# Patient Record
Sex: Male | Born: 1975
Health system: Southern US, Community
[De-identification: ages and names within clinical notes are randomized; demographics above are authoritative.]

## PROBLEM LIST (undated history)

## (undated) DIAGNOSIS — R480 Dyslexia and alexia: Secondary | ICD-10-CM

## (undated) DIAGNOSIS — R002 Palpitations: Secondary | ICD-10-CM

## (undated) DIAGNOSIS — F419 Anxiety disorder, unspecified: Secondary | ICD-10-CM

## (undated) DIAGNOSIS — F32A Depression, unspecified: Secondary | ICD-10-CM

## (undated) DIAGNOSIS — F329 Major depressive disorder, single episode, unspecified: Secondary | ICD-10-CM

## (undated) DIAGNOSIS — N184 Chronic kidney disease, stage 4 (severe): Secondary | ICD-10-CM

## (undated) DIAGNOSIS — I639 Cerebral infarction, unspecified: Secondary | ICD-10-CM

## (undated) DIAGNOSIS — F1120 Opioid dependence, uncomplicated: Secondary | ICD-10-CM

## (undated) DIAGNOSIS — R0600 Dyspnea, unspecified: Secondary | ICD-10-CM

## (undated) DIAGNOSIS — I10 Essential (primary) hypertension: Secondary | ICD-10-CM

## (undated) DIAGNOSIS — Z720 Tobacco use: Secondary | ICD-10-CM

## (undated) DIAGNOSIS — E7800 Pure hypercholesterolemia, unspecified: Secondary | ICD-10-CM

## (undated) DIAGNOSIS — I160 Hypertensive urgency: Secondary | ICD-10-CM

## (undated) HISTORY — PX: NO PAST SURGERIES: SHX2092

## (undated) HISTORY — DX: Hypertensive urgency: I16.0

## (undated) HISTORY — DX: Opioid dependence, uncomplicated: F11.20

## (undated) HISTORY — DX: Palpitations: R00.2

---

## 1998-01-14 ENCOUNTER — Emergency Department (HOSPITAL_COMMUNITY): Admission: EM | Admit: 1998-01-14 | Discharge: 1998-01-14 | Payer: Self-pay | Admitting: Emergency Medicine

## 1998-01-15 ENCOUNTER — Ambulatory Visit (HOSPITAL_COMMUNITY): Admission: RE | Admit: 1998-01-15 | Discharge: 1998-01-15 | Payer: Self-pay | Admitting: Family Medicine

## 2000-07-17 ENCOUNTER — Emergency Department (HOSPITAL_COMMUNITY): Admission: EM | Admit: 2000-07-17 | Discharge: 2000-07-17 | Payer: Self-pay | Admitting: Emergency Medicine

## 2000-11-14 ENCOUNTER — Emergency Department (HOSPITAL_COMMUNITY): Admission: EM | Admit: 2000-11-14 | Discharge: 2000-11-14 | Payer: Self-pay | Admitting: Emergency Medicine

## 2000-11-14 ENCOUNTER — Encounter: Payer: Self-pay | Admitting: Emergency Medicine

## 2002-01-17 ENCOUNTER — Encounter: Admission: RE | Admit: 2002-01-17 | Discharge: 2002-01-17 | Payer: Self-pay | Admitting: General Practice

## 2002-01-17 ENCOUNTER — Encounter: Payer: Self-pay | Admitting: General Practice

## 2002-12-04 ENCOUNTER — Emergency Department (HOSPITAL_COMMUNITY): Admission: EM | Admit: 2002-12-04 | Discharge: 2002-12-04 | Payer: Self-pay | Admitting: *Deleted

## 2003-02-20 ENCOUNTER — Emergency Department (HOSPITAL_COMMUNITY): Admission: EM | Admit: 2003-02-20 | Discharge: 2003-02-20 | Payer: Self-pay | Admitting: Emergency Medicine

## 2003-02-20 ENCOUNTER — Encounter: Payer: Self-pay | Admitting: Emergency Medicine

## 2003-06-05 ENCOUNTER — Emergency Department (HOSPITAL_COMMUNITY): Admission: EM | Admit: 2003-06-05 | Discharge: 2003-06-05 | Payer: Self-pay | Admitting: *Deleted

## 2004-07-12 ENCOUNTER — Emergency Department (HOSPITAL_COMMUNITY): Admission: EM | Admit: 2004-07-12 | Discharge: 2004-07-12 | Payer: Self-pay | Admitting: Emergency Medicine

## 2004-11-28 ENCOUNTER — Emergency Department (HOSPITAL_COMMUNITY): Admission: EM | Admit: 2004-11-28 | Discharge: 2004-11-28 | Payer: Self-pay | Admitting: Family Medicine

## 2006-05-19 ENCOUNTER — Emergency Department (HOSPITAL_COMMUNITY): Admission: EM | Admit: 2006-05-19 | Discharge: 2006-05-19 | Payer: Self-pay | Admitting: Emergency Medicine

## 2007-08-15 ENCOUNTER — Emergency Department (HOSPITAL_COMMUNITY): Admission: EM | Admit: 2007-08-15 | Discharge: 2007-08-15 | Payer: Self-pay | Admitting: Family Medicine

## 2007-10-22 ENCOUNTER — Emergency Department (HOSPITAL_COMMUNITY): Admission: EM | Admit: 2007-10-22 | Discharge: 2007-10-22 | Payer: Self-pay | Admitting: Family Medicine

## 2009-09-29 ENCOUNTER — Emergency Department (HOSPITAL_COMMUNITY): Admission: EM | Admit: 2009-09-29 | Discharge: 2009-09-29 | Payer: Self-pay | Admitting: Emergency Medicine

## 2009-11-28 ENCOUNTER — Emergency Department (HOSPITAL_COMMUNITY): Admission: EM | Admit: 2009-11-28 | Discharge: 2009-11-28 | Payer: Self-pay | Admitting: Family Medicine

## 2010-03-17 ENCOUNTER — Emergency Department (HOSPITAL_COMMUNITY): Admission: EM | Admit: 2010-03-17 | Discharge: 2010-03-17 | Payer: Self-pay | Admitting: Family Medicine

## 2011-01-03 LAB — BASIC METABOLIC PANEL
BUN: 12 mg/dL (ref 6–23)
CO2: 27 mEq/L (ref 19–32)
Calcium: 9.3 mg/dL (ref 8.4–10.5)
Creatinine, Ser: 1.1 mg/dL (ref 0.4–1.5)
GFR calc Af Amer: >60 mL/min (ref >60)

## 2011-01-03 LAB — CBC
MCHC: 33.3 g/dL (ref 30.0–36.0)
Platelets: 278 10*3/uL (ref 150–400)
RBC: 4.77 MIL/uL (ref 4.22–5.81)
RDW: 14.4 % (ref 11.5–15.5)

## 2011-01-03 LAB — URINALYSIS, ROUTINE W REFLEX MICROSCOPIC
Nitrite: NEGATIVE
Protein, ur: NEGATIVE mg/dL
Specific Gravity, Urine: 1.031 — ABNORMAL HIGH (ref 1.005–1.030)
Urobilinogen, UA: 0.2 mg/dL (ref 0.0–1.0)

## 2011-01-03 LAB — DIFFERENTIAL
Basophils Absolute: 0.1 10*3/uL (ref 0.0–0.1)
Basophils Relative: 2 % — ABNORMAL HIGH (ref 0–1)
Monocytes Relative: 6 % (ref 3–12)
Neutro Abs: 3.5 10*3/uL (ref 1.7–7.7)
Neutrophils Relative %: 58 % (ref 43–77)

## 2011-01-03 LAB — ACETAMINOPHEN LEVEL: Acetaminophen (Tylenol), Serum: <10 ug/mL — ABNORMAL LOW (ref 10–30)

## 2011-01-03 LAB — RAPID URINE DRUG SCREEN, HOSP PERFORMED
Barbiturates: NOT DETECTED
Opiates: NOT DETECTED

## 2011-01-03 LAB — ETHANOL: Alcohol, Ethyl (B): <5 mg/dL (ref 0–10)

## 2011-05-05 ENCOUNTER — Other Ambulatory Visit: Payer: Self-pay | Admitting: Family Medicine

## 2011-05-05 ENCOUNTER — Ambulatory Visit
Admission: RE | Admit: 2011-05-05 | Discharge: 2011-05-05 | Disposition: A | Discharge status: Home / Self Care | Payer: Medicaid Other | Source: Ambulatory Visit | Attending: Family Medicine | Admitting: Family Medicine

## 2011-05-05 DIAGNOSIS — S99912A Unspecified injury of left ankle, initial encounter: Secondary | ICD-10-CM

## 2011-06-23 LAB — I-STAT 8, (EC8 V) (CONVERTED LAB)
Acid-Base Excess: 3 — ABNORMAL HIGH
Chloride: 105
HCT: 49
Operator id: 116391
Potassium: 4.2
TCO2: 33

## 2011-06-23 LAB — POCT I-STAT CREATININE
Creatinine, Ser: 1.1
Operator id: 116391

## 2012-06-01 ENCOUNTER — Other Ambulatory Visit: Payer: Self-pay | Admitting: Family Medicine

## 2012-06-01 ENCOUNTER — Ambulatory Visit
Admission: RE | Admit: 2012-06-01 | Discharge: 2012-06-01 | Disposition: A | Discharge status: Home / Self Care | Payer: Medicaid Other | Source: Ambulatory Visit | Attending: Family Medicine | Admitting: Family Medicine

## 2012-06-01 DIAGNOSIS — R52 Pain, unspecified: Secondary | ICD-10-CM

## 2012-06-01 DIAGNOSIS — R609 Edema, unspecified: Secondary | ICD-10-CM

## 2012-09-01 ENCOUNTER — Ambulatory Visit (HOSPITAL_COMMUNITY)
Admission: AD | Admit: 2012-09-01 | Discharge: 2012-09-01 | Disposition: A | Discharge status: Home / Self Care | Payer: Medicaid Other | Source: Other Acute Inpatient Hospital | Attending: Emergency Medicine | Admitting: Emergency Medicine

## 2012-09-01 ENCOUNTER — Emergency Department (HOSPITAL_COMMUNITY)
Admission: EM | Admit: 2012-09-01 | Discharge: 2012-09-01 | Disposition: A | Discharge status: Home / Self Care | Payer: Medicaid Other | Source: Home / Self Care | Attending: Emergency Medicine | Admitting: Emergency Medicine

## 2012-09-01 ENCOUNTER — Emergency Department (HOSPITAL_COMMUNITY): Payer: Medicaid Other

## 2012-09-01 ENCOUNTER — Encounter (HOSPITAL_COMMUNITY): Payer: Self-pay | Admitting: *Deleted

## 2012-09-01 DIAGNOSIS — W108XXA Fall (on) (from) other stairs and steps, initial encounter: Secondary | ICD-10-CM | POA: Insufficient documentation

## 2012-09-01 DIAGNOSIS — T148XXA Other injury of unspecified body region, initial encounter: Secondary | ICD-10-CM

## 2012-09-01 DIAGNOSIS — S6990XA Unspecified injury of unspecified wrist, hand and finger(s), initial encounter: Secondary | ICD-10-CM | POA: Insufficient documentation

## 2012-09-01 DIAGNOSIS — I10 Essential (primary) hypertension: Secondary | ICD-10-CM

## 2012-09-01 DIAGNOSIS — S59909A Unspecified injury of unspecified elbow, initial encounter: Secondary | ICD-10-CM | POA: Insufficient documentation

## 2012-09-01 HISTORY — DX: Essential (primary) hypertension: I10

## 2012-09-01 MED ORDER — BENAZEPRIL HCL 40 MG PO TABS: 40.0000 mg | ORAL_TABLET | Freq: Every day | ORAL | Status: DC

## 2012-09-01 MED ORDER — CLONIDINE HCL 0.1 MG PO TABS
ORAL_TABLET | ORAL | Status: AC
  Filled 2012-09-01: qty 2

## 2012-09-01 MED ORDER — CLONIDINE HCL 0.1 MG PO TABS
0.2000 mg | ORAL_TABLET | Freq: Once | ORAL | Status: AC
  Administered 2012-09-01: 0.2 mg via ORAL

## 2012-09-01 NOTE — ED Notes (Signed)
Joe in radiology made aware patient is on the way

## 2012-09-01 NOTE — ED Provider Notes (Signed)
Chief Complaint  Patient presents with  . Arm Injury    History of Present Illness:   The patient is a 36 year old male who injured his right forearm 3 days ago. He was next to a staircase and slipped, catching his right arm between the newel posts of the stairway. He felt something crack in his forearm, and since then he's felt a popping or cracking whenever he tries to move his wrist. He has full range of motion of the wrist, elbow, shoulder, and hand there is no numbness or tingling. He does have some swelling over the forearm.  He also has a history of high blood pressure. He has blood pressure medication at home. He cannot recall the name of this. He states whenever he takes it he gets migraine headaches, so has not been taking it for weeks or months. He denies any current headaches, blurry vision, dizziness, shortness of breath, or chest pain, pressure, or tightness.  Review of Systems:  Other than noted above, the patient denies any of the following symptoms: Systemic:  No fevers, chills, sweats, or aches.  No fatigue or tiredness. Musculoskeletal:  No joint pain, arthritis, bursitis, swelling, back pain, or neck pain. Neurological:  No muscular weakness, paresthesias, headache, or trouble with speech or coordination.  No dizziness.  [place]:  Past medical history, family history, social history, meds, and allergies were reviewed.  Physical Exam:   Vital signs:  BP 199/120  Pulse 81  Temp 98.2 F (36.8 C) (Oral)  Resp 16  SpO2 100% Filed Vitals:   09/01/12 1650 09/01/12 1845 09/01/12 1854  BP: 213/113 199/123 199/120  Pulse: 81    Temp: 98.2 F (36.8 C)    TempSrc: Oral    Resp: 16    SpO2: 100%      Gen:  Alert and oriented times 3.  In no distress. Lungs: Clear to auscultation. Heart: Regular rhythm, no gallops or murmurs. Musculoskeletal: He has swelling over the forearm and tenderness to palpation but no bruising or deformity. Elbow and wrist have full range of motion  with no pain. Otherwise, all joints had a full a ROM with no swelling, bruising or deformity.  No edema, pulses full. Extremities were warm and pink.  Capillary refill was brisk.  Skin:  Clear, warm and dry.  No rash. Neuro:  Alert and oriented times 3.  Muscle strength was normal.  Sensation was intact to light touch.   Radiology:  Dg Forearm Right  09/01/2012  *RADIOLOGY REPORT*  Clinical Data: [place] down stairs and injured right forearm.  RIGHT FOREARM - 2 VIEW  Comparison: None.  Findings: No acute fractures involving the radius or ulna. Enthesopathic spur at the insertion of the triceps tendon on the olecranon at the elbow.  No other intrinsic osseous abnormalities.  IMPRESSION: No acute or significant osseous abnormality.   Original Report Authenticated By: Evangeline Dakin, M.D.    I reviewed the images independently and personally and concur with the radiologist's findings.  Course in Urgent Care Center:   He was given clonidine 0.2 mg by mouth and his blood pressure came down somewhat to 199/103. He was given a wrist splint to use.  Assessment:  The primary encounter diagnosis was Contusion. A diagnosis of Hypertension was also pertinent to this visit.  Plan:   1.  The following meds were prescribed:   New Prescriptions   BENAZEPRIL (LOTENSIN) 40 MG TABLET    Take 1 tablet (40 mg total) by mouth daily.  2.  The patient was instructed in symptomatic care, including rest and activity, elevation, application of ice and compression.  Appropriate handouts were given. 3.  The patient was told to return if becoming worse in any way, if no better in 3 or 4 days, and given some red flag symptoms that would indicate earlier return.   4.  The patient was told to follow up with his primary care physician early next week for followup on his blood pressure, also to followup with Dr. Veverly Fells if his arm isn't better in 2 weeks.    Harden Mo, MD 09/01/12 2019

## 2012-09-01 NOTE — ED Notes (Signed)
Pt  Reports   He  Fell   3  Days  Ago  And  Injured  His  r  Forearm /  Elbow          He  Has  Pain on palpation and  On  Movement       His  bp is  Also high  And  He  Stopped  Taking  His  bp  meds  Over  1  Month ago  Because  He  Said it  Gave  Him a  Headache

## 2012-09-01 NOTE — ED Notes (Signed)
Advise RN of BP reading

## 2012-09-02 ENCOUNTER — Telehealth (HOSPITAL_COMMUNITY): Payer: Self-pay | Admitting: Emergency Medicine

## 2012-09-02 NOTE — ED Notes (Signed)
Patient needed work note reprinted.  Note was reprinted, signed and given to patient

## 2013-08-08 ENCOUNTER — Emergency Department (INDEPENDENT_AMBULATORY_CARE_PROVIDER_SITE_OTHER): Payer: Self-pay

## 2013-08-08 ENCOUNTER — Encounter (HOSPITAL_COMMUNITY): Payer: Self-pay | Admitting: Emergency Medicine

## 2013-08-08 ENCOUNTER — Emergency Department (INDEPENDENT_AMBULATORY_CARE_PROVIDER_SITE_OTHER)
Admission: EM | Admit: 2013-08-08 | Discharge: 2013-08-08 | Disposition: A | Discharge status: Home / Self Care | Payer: Self-pay | Source: Home / Self Care | Attending: Family Medicine | Admitting: Family Medicine

## 2013-08-08 DIAGNOSIS — S90851A Superficial foreign body, right foot, initial encounter: Secondary | ICD-10-CM

## 2013-08-08 DIAGNOSIS — IMO0002 Reserved for concepts with insufficient information to code with codable children: Secondary | ICD-10-CM

## 2013-08-08 NOTE — ED Notes (Signed)
Pt  States  He  Has  BP  meds  At  Home    He  Reports  He  Has  Not taken them          He  Was  Advised  Of the   Importance    Of  Adhering to  His  Treatment  Regimen

## 2013-08-08 NOTE — ED Provider Notes (Signed)
CSN: [id number]     Arrival date & time 08/08/13  1015 History   First MD Initiated Contact with Patient 08/08/13 1045     Chief Complaint  Patient presents with  . Foreign Body   (Consider location/radiation/quality/duration/timing/severity/associated sxs/prior Treatment) HPI Comments: 37 year old male presents complaining of having glass embedded in the bottom of his right foot. A few days ago, glass ashtray still in his bedroom and shattered onto the ground. He thought he had picked up all the pieces, but yesterday evening he stepped on a piece that he admits to. He feels that stabbing into his foot every time he tries to walk. He says he can feel a piece of glass with his finger but he has not been able to grab it. This is on the sole of his right foot. He denies any other injury. Denies fever, chills, or pain elsewhere. His tetanus vaccine is up-to-date, within the past 5 years  Patient is a 37 y.o. male presenting with foreign body.  Foreign Body Associated symptoms: no abdominal pain, no cough, no nausea, no sore throat and no vomiting     Past Medical History  Diagnosis Date  . Hypertension    History reviewed. No pertinent past surgical history. History reviewed. No pertinent family history. History  Substance Use Topics  . Smoking status: Current Every Day Smoker  . Smokeless tobacco: Not on file  . Alcohol Use: Yes    Review of Systems  Constitutional: Negative for fever, chills and fatigue.  HENT: Negative for sore throat.   Eyes: Negative for visual disturbance.  Respiratory: Negative for cough and shortness of breath.   Cardiovascular: Negative for chest pain, palpitations and leg swelling.  Gastrointestinal: Negative for nausea, vomiting, abdominal pain, diarrhea and constipation.  Genitourinary: Negative for dysuria, urgency, frequency and hematuria.  Musculoskeletal: Negative for arthralgias, myalgias, neck pain and neck stiffness.  Skin: Positive for wound.  Negative for rash.  Neurological: Negative for dizziness, weakness and light-headedness.    Allergies  Review of patient's allergies indicates no known allergies.  Home Medications   Current Outpatient Rx  Name  Route  Sig  Dispense  Refill  . benazepril (LOTENSIN) 40 MG tablet   Oral   Take 1 tablet (40 mg total) by mouth daily.   30 tablet   0    BP 210/120  Pulse 72  Temp(Src) 98.6 F (37 C) (Oral)  Resp 16  SpO2 100% Physical Exam  Nursing note and vitals reviewed. Constitutional: He is oriented to person, place, and time. He appears well-developed and well-nourished. No distress.  HENT:  Head: Normocephalic.  Pulmonary/Chest: Effort normal. No respiratory distress.  Musculoskeletal:       Right foot: He exhibits tenderness (puncture wound on the sole of the right foot. A small speck of glass is visible protruding from this.).  Neurological: He is alert and oriented to person, place, and time. Coordination normal.  Skin: Skin is warm and dry. No rash noted. He is not diaphoretic.  Psychiatric: He has a normal mood and affect. Judgment normal.    ED Course  Procedures (including critical care time) Labs Review Labs Reviewed - No data to display Imaging Review Dg Foot Complete Right  08/08/2013   CLINICAL DATA:  Evaluation for radiopaque foreign body  EXAM: RIGHT FOOT COMPLETE - 3+ VIEW  COMPARISON:  None.  FINDINGS: There is no evidence of fracture or dislocation. There is no evidence of arthropathy or other focal bone abnormality. Soft tissues are  unremarkable. There is no evidence of an appreciable radiopaque foreign body.  IMPRESSION: Negative.   Electronically Signed   By: Margaree Mackintosh M.D.   On: 08/08/2013 11:29      MDM   1. Foreign body in foot, right, initial encounter    Foreign body removed-4 mm sliver of glass. No signs of infection. Followup as needed. He will followup with the community health clinic for management of his  hypertension.   Liam Graham, PA-C 08/08/13 1151

## 2013-08-08 NOTE — ED Notes (Signed)
Pt  States  He  Stepped  On a  Broken ashtray last  Pm  And  It  Feels  Like  It  May  Be   Some  Glass  In the  Bottom of  His  r  Foot       He reports  Pain on  Weight bearing      His  BP  Is  High  And  He  Takes  No meds  At this  Time

## 2013-08-09 NOTE — ED Provider Notes (Signed)
Medical screening examination/treatment/procedure(s) were performed by a resident physician or non-physician practitioner and as the supervising physician I was immediately available for consultation/collaboration.  Lynne Leader, MD    Gregor Hams, MD 08/09/13 [phone]

## 2015-08-24 ENCOUNTER — Encounter (HOSPITAL_COMMUNITY): Payer: Self-pay | Admitting: *Deleted

## 2015-08-24 ENCOUNTER — Emergency Department (HOSPITAL_COMMUNITY)
Admission: EM | Admit: 2015-08-24 | Discharge: 2015-08-24 | Disposition: A | Discharge status: Home / Self Care | Payer: Self-pay | Attending: Emergency Medicine | Admitting: Emergency Medicine

## 2015-08-24 ENCOUNTER — Emergency Department (HOSPITAL_COMMUNITY): Payer: Self-pay

## 2015-08-24 DIAGNOSIS — R112 Nausea with vomiting, unspecified: Secondary | ICD-10-CM | POA: Insufficient documentation

## 2015-08-24 DIAGNOSIS — N4400 Torsion of testis, unspecified: Secondary | ICD-10-CM

## 2015-08-24 DIAGNOSIS — N433 Hydrocele, unspecified: Secondary | ICD-10-CM

## 2015-08-24 DIAGNOSIS — N50812 Left testicular pain: Secondary | ICD-10-CM

## 2015-08-24 DIAGNOSIS — F17200 Nicotine dependence, unspecified, uncomplicated: Secondary | ICD-10-CM | POA: Insufficient documentation

## 2015-08-24 DIAGNOSIS — K4090 Unilateral inguinal hernia, without obstruction or gangrene, not specified as recurrent: Secondary | ICD-10-CM | POA: Insufficient documentation

## 2015-08-24 DIAGNOSIS — I159 Secondary hypertension, unspecified: Secondary | ICD-10-CM

## 2015-08-24 MED ORDER — CLONIDINE HCL 0.2 MG PO TABS
0.2000 mg | ORAL_TABLET | Freq: Once | ORAL | Status: AC
  Administered 2015-08-24: 0.2 mg via ORAL
  Filled 2015-08-24: qty 1

## 2015-08-24 MED ORDER — BENAZEPRIL HCL 40 MG PO TABS
40.0000 mg | ORAL_TABLET | Freq: Every day | ORAL | Status: DC
  Administered 2015-08-24: 40 mg via ORAL
  Filled 2015-08-24 (×2): qty 1

## 2015-08-24 MED ORDER — BENAZEPRIL HCL 40 MG PO TABS: 40.0000 mg | ORAL_TABLET | Freq: Every day | ORAL | Status: DC

## 2015-08-24 MED ORDER — AMLODIPINE BESYLATE 5 MG PO TABS: 5.0000 mg | ORAL_TABLET | Freq: Every day | ORAL | Status: DC

## 2015-08-24 MED ORDER — IBUPROFEN 600 MG PO TABS: 600.0000 mg | ORAL_TABLET | Freq: Four times a day (QID) | ORAL | Status: DC | PRN

## 2015-08-24 MED ORDER — AMLODIPINE BESYLATE 5 MG PO TABS
5.0000 mg | ORAL_TABLET | Freq: Once | ORAL | Status: AC
  Administered 2015-08-24: 5 mg via ORAL
  Filled 2015-08-24: qty 1

## 2015-08-24 NOTE — ED Notes (Signed)
Pt reports heavy lifting at work, now having mass and pain to left testicle. Pain gets worse when lying down at night. Pt is also hypertensive at triage, reports being out of bp meds > 4yr.

## 2015-08-24 NOTE — Discharge Planning (Signed)
Spoke to patient regarding primary care resources and the [facility] orange card. Orange card application provided and explained, pt instructed to contact me once the application is complete for a eligibility appointment. Follow up appointment made with [facility] for Thursday Dec 8,2016 @ 2:00pm, pt verbalized understanding of the appointment. My contact information provided for any future questions or concerns. No other [facility]ist needs identified at this time.   [facility] [phone]

## 2015-08-24 NOTE — ED Notes (Signed)
Pt returned from ultrasound.  MD at bedside.

## 2015-08-24 NOTE — ED Notes (Signed)
Pt off unit with ultrasound 

## 2015-08-24 NOTE — ED Provider Notes (Signed)
CSN: [id number]     Arrival date & time 08/24/15  [id number] History   First MD Initiated Contact with Patient 08/24/15 1013     Chief Complaint  Patient presents with  . Testicle Pain     (Consider location/radiation/quality/duration/timing/severity/associated sxs/prior Treatment) HPI Comments: 39 y.o. Male with history of HTN presents for left scrotal pain and swelling x1 year.  The patient reports that about 1 year ago he lifted something heavy that he should not have and that since that time he has intermittently felt severe pain in the left testicle with associated swelling and at times intermittent nausea and vomiting.  He denies any history of constipation.  He states that he does not currently feel nauseous.  He did feel pain in the scrotum this morning but not as severe as he has had in the past.  Reports normal urination.  When his scrotal pain is severe it takes his breath away.  Swelling and pain get worse with heavy lifting/straining and he does do manual labor working on roofs. Patient also has not had his blood pressure medications in an extensive amount of time.  Denies chest pain, shortness of breath, leg swelling, decreased urination, headache, changes in vision.  Was previous on benazepril but because of insurance issues has not had BP medications.   Past Medical History  Diagnosis Date  . Hypertension    History reviewed. No pertinent past surgical history. History reviewed. No pertinent family history. Social History  Substance Use Topics  . Smoking status: Current Every Day Smoker  . Smokeless tobacco: None  . Alcohol Use: Yes    Review of Systems  Constitutional: Negative for fever, diaphoresis, appetite change and fatigue.  HENT: Negative for congestion, postnasal drip and rhinorrhea.   Eyes: Negative for pain.  Respiratory: Negative for cough, chest tightness and shortness of breath.   Cardiovascular: Negative for chest pain and palpitations.  Gastrointestinal:  Positive for nausea (intermittent) and vomiting (intermittent). Negative for abdominal pain, diarrhea and constipation.  Endocrine: Negative for polyphagia and polyuria.  Genitourinary: Positive for scrotal swelling (left). Negative for hematuria, decreased urine volume, penile swelling and difficulty urinating.  Musculoskeletal: Negative for myalgias and back pain.  Skin: Negative for rash.  Neurological: Negative for dizziness, facial asymmetry, weakness, numbness and headaches.      Allergies  Lactose intolerance (gi) and Other  Home Medications   Prior to Admission medications   Medication Sig Start Date End Date Taking? Authorizing Provider  amLODipine (NORVASC) 5 MG tablet Take 1 tablet (5 mg total) by mouth daily. 08/24/15   Harvel Quale, MD  benazepril (LOTENSIN) 40 MG tablet Take 1 tablet (40 mg total) by mouth daily. 08/24/15   Harvel Quale, MD  ibuprofen (ADVIL,MOTRIN) 600 MG tablet Take 1 tablet (600 mg total) by mouth every 6 (six) hours as needed for moderate pain. 08/24/15   Harvel Quale, MD   BP 233/134 mmHg  Pulse 67  Temp(Src) 98.4 F (36.9 C) (Oral)  Resp 18  Ht 5\' 11"  (1.803 m)  Wt 233 lb (105.688 kg)  BMI 32.51 kg/m2  SpO2 97% Physical Exam  Constitutional: He is oriented to person, place, and time. He appears well-developed and well-nourished. No distress.  HENT:  Head: Normocephalic and atraumatic.  Right Ear: External ear normal.  Left Ear: External ear normal.  Mouth/Throat: Oropharynx is clear and moist. No oropharyngeal exudate.  Eyes: EOM are normal. Pupils are equal, round, and reactive to light.  Neck: Normal range  of motion. Neck supple.  Cardiovascular: Normal rate, regular rhythm, normal heart sounds and intact distal pulses.   No murmur heard. Pulmonary/Chest: Effort normal. No respiratory distress. He has no wheezes. He has no rales.  Abdominal: Soft. He exhibits no distension. There is no tenderness. A hernia is present. Hernia  confirmed positive in the left inguinal area. Hernia confirmed negative in the right inguinal area.  Genitourinary: Penis normal. Right testis shows no mass, no swelling and no tenderness. Right testis is descended. Cremasteric reflex is not absent on the right side. Left testis shows mass (appears to contain bowel on examination) and swelling. Left testis is descended. Cremasteric reflex is not absent on the left side. Circumcised.  Musculoskeletal: He exhibits no edema.  Neurological: He is alert and oriented to person, place, and time. He has normal strength. No cranial nerve deficit or sensory deficit. He exhibits normal muscle tone. Coordination normal.  Skin: Skin is warm and dry. No rash noted. He is not diaphoretic.  Vitals reviewed.   ED Course  Procedures (including critical care time) Labs Review Labs Reviewed - No data to display  Imaging Review US Scrotum  08/24/2015  CLINICAL DATA:  Chronic left scrotal region pain and swelling EXAM: SCROTAL ULTRASOUND DOPPLER ULTRASOUND OF THE TESTICLES TECHNIQUE: Complete ultrasound examination of the testicles, epididymis, and other scrotal structures was performed. Color and spectral Doppler ultrasound were also utilized to evaluate blood flow to the testicles. COMPARISON:  None. FINDINGS: Right testicle Measurements: 4.1 x 2.1 x 2.7 cm. No mass or microlithiasis visualized. Left testicle Measurements: 3.9 x 2.3 x 2.7 cm. No mass or microlithiasis visualized. Right epididymis: There is a cyst arising from the head of the epididymis on the right measuring 1.6 x 1.0 x 1.6 cm. No inflammatory change noted. Left epididymis: There is a cyst arising from the head of the left epididymis measuring 0.0 x 0.5 x 0.6 cm. No inflammatory focus appreciable. Hydrocele: There is a large hydrocele on the left containing occasional internal echoes. There is a minimal hydrocele on the right. Varicocele:  None visualized. Pulsed Doppler interrogation of both testes  demonstrates normal low resistance arterial and venous waveforms bilaterally. The peak systolic velocity in the right testis is 8.7 centimeter/second with an end-diastolic velocity of 2.7 centimeter/second. The peak systolic velocity in the left testis is 9.5 cm/sec with an end-diastolic velocity of 3.6 cm/sec. No inguinal hernias are identified. No scrotal abscess or scrotal wall thickening on either side. IMPRESSION: Large left-sided hydrocele containing a mild degree of debris. No abscess seen. Small spermatoceles bilaterally.  No epididymitis on either side. No intratesticular mass or torsion.  No evidence of orchitis. No demonstrable inguinal hernia by ultrasound on either side. Electronically Signed   By: Lowella Grip III M.D.   On: 08/24/2015 12:51   [place] Art/ven Flow Abd Pelv Doppler  08/24/2015  CLINICAL DATA:  Chronic left scrotal region pain and swelling EXAM: SCROTAL ULTRASOUND DOPPLER ULTRASOUND OF THE TESTICLES TECHNIQUE: Complete ultrasound examination of the testicles, epididymis, and other scrotal structures was performed. Color and spectral Doppler ultrasound were also utilized to evaluate blood flow to the testicles. COMPARISON:  None. FINDINGS: Right testicle Measurements: 4.1 x 2.1 x 2.7 cm. No mass or microlithiasis visualized. Left testicle Measurements: 3.9 x 2.3 x 2.7 cm. No mass or microlithiasis visualized. Right epididymis: There is a cyst arising from the head of the epididymis on the right measuring 1.6 x 1.0 x 1.6 cm. No inflammatory change noted. Left epididymis: There  is a cyst arising from the head of the left epididymis measuring 0.0 x 0.5 x 0.6 cm. No inflammatory focus appreciable. Hydrocele: There is a large hydrocele on the left containing occasional internal echoes. There is a minimal hydrocele on the right. Varicocele:  None visualized. Pulsed Doppler interrogation of both testes demonstrates normal low resistance arterial and venous waveforms bilaterally. The peak  systolic velocity in the right testis is 8.7 centimeter/second with an end-diastolic velocity of 2.7 centimeter/second. The peak systolic velocity in the left testis is 9.5 cm/sec with an end-diastolic velocity of 3.6 cm/sec. No inguinal hernias are identified. No scrotal abscess or scrotal wall thickening on either side. IMPRESSION: Large left-sided hydrocele containing a mild degree of debris. No abscess seen. Small spermatoceles bilaterally.  No epididymitis on either side. No intratesticular mass or torsion.  No evidence of orchitis. No demonstrable inguinal hernia by ultrasound on either side. Electronically Signed   By: Lowella Grip III M.D.   On: 08/24/2015 12:51   I have personally reviewed and evaluated these images and lab results as part of my medical decision-making.   EKG Interpretation None      MDM  Patient seen and evaluated in stable condition.  Noted to have extremely elevated BP but asymptomatic and has not had his BP in years or had it checked recently.  Previously prescribed BP meds given with some improvement in BP which improved more with single dose Catapres.  [place] was negative for sign of hernia but noted to have large left-sided hydrocele which would explain patient discomfort.  Patient updated on these results and need for scrotal support, ice, NSAIDs and follow up with urology.  His BP was also discussed with him at length.  He as provided with prescriptions for previous BP meds and care management helped to arrange follow up outpatient appointment for the patient.  Patient was discharged home in stable condition with all questions answered.  Strict return precautions given which patient expressed understanding of and agreement to. Final diagnoses:  Hydrocele, unspecified hydrocele type  Secondary hypertension, unspecified    1. Hydrocele  2. HTN, uncontrolled    Harvel Quale, MD 08/24/15 2217

## 2015-08-24 NOTE — Discharge Instructions (Signed)
You were seen today and found to have uncontrolled hypertension.  Follow up outpatient as directed.  Take the blood pressure medications prescribed.  Return immediately with chest pain, shortness of breath, headache, focal weakness, change in sensation or speech.  You were also seen for the swelling in your scrotum.  This is secondary to swelling in the veins of your testicle.  Wear tight supportive underwear, ice the area, elevate it with a pillow when resting.  Try your best to avoid strenuous activity or heavy lifting.  Hydrocele, Adult A hydrocele is a collection of fluid in the loose pouch of skin that holds the testicles (scrotum). Usually, it affects only one testicle. CAUSES This condition may be caused by:  An injury to the scrotum.  An infection.  A tumor or cancer of the testicle.  Twisting of a testicle.  Decreased blood flow to the scrotum. SYMPTOMS A hydrocele feels like a water-filled balloon. It may also feel heavy. A hydrocele can cause:  Swelling of the scrotum. The swelling may decrease when you lie down.  Swelling of the groin.  Mild discomfort in the scrotum.  Pain. This can develop if the hydrocele was caused by infection or twisting. DIAGNOSIS This condition may be diagnosed with a medical history, physical exam, and imaging tests. You may also have blood and urine tests to check for infection. TREATMENT Treatment may include:  Watching and waiting, particularly if the hydrocele causes no symptoms.  Treatment of the underlying condition. This may include using antibiotic medicine.  Surgery to drain the fluid. Some surgical options include:  Needle aspiration. For this procedure, a needle is used to drain fluid.  Hydrocelectomy. For this procedure, an incision is made in the scrotum to remove the fluid sac. HOME CARE INSTRUCTIONS  Keep all follow-up visits as told by your health care provider. This is important.  Watch the hydrocele for any  changes.  Take over-the-counter and prescription medicines only as told by your health care provider.  If you were prescribed an antibiotic medicine, use it as told by your health care provider. Do not stop using the antibiotic even if your condition improves. SEEK MEDICAL CARE IF:  The swelling in your scrotum or groin gets worse.  The hydrocele becomes red, firm, tender to the touch, or painful.  You notice any changes in the hydrocele.  You have a fever.   This information is not intended to replace advice given to you by your health care provider. Make sure you discuss any questions you have with your health care provider.   Document Released: 03/09/2010 Document Revised: 02/03/2015 Document Reviewed: 09/15/2014 Elsevier Interactive Patient Education 2016 [employer].   Hypertension Hypertension, commonly called high blood pressure, is when the force of blood pumping through your arteries is too strong. Your arteries are the blood vessels that carry blood from your heart throughout your body. A blood pressure reading consists of a higher number over a lower number, such as 110/72. The higher number (systolic) is the pressure inside your arteries when your heart pumps. The lower number (diastolic) is the pressure inside your arteries when your heart relaxes. Ideally you want your blood pressure below 120/80. Hypertension forces your heart to work harder to pump blood. Your arteries may become narrow or stiff. Having untreated or uncontrolled hypertension can cause heart attack, stroke, kidney disease, and other problems. RISK FACTORS Some risk factors for high blood pressure are controllable. Others are not.  Risk factors you cannot control include:  Race. You may be at higher risk if you are African American.  Age. Risk increases with age.  Gender. Men are at higher risk than women before age 69 years. After age 51, women are at higher risk than men. Risk factors you can  control include:  Not getting enough exercise or physical activity.  Being overweight.  Getting too much fat, sugar, calories, or salt in your diet.  Drinking too much alcohol. SIGNS AND SYMPTOMS Hypertension does not usually cause signs or symptoms. Extremely high blood pressure (hypertensive crisis) may cause headache, anxiety, shortness of breath, and nosebleed. DIAGNOSIS To check if you have hypertension, your health care provider will measure your blood pressure while you are seated, with your arm held at the level of your heart. It should be measured at least twice using the same arm. Certain conditions can cause a difference in blood pressure between your right and left arms. A blood pressure reading that is higher than normal on one occasion does not mean that you need treatment. If it is not clear whether you have high blood pressure, you may be asked to return on a different day to have your blood pressure checked again. Or, you may be asked to monitor your blood pressure at home for 1 or more weeks. TREATMENT Treating high blood pressure includes making lifestyle changes and possibly taking medicine. Living a healthy lifestyle can help lower high blood pressure. You may need to change some of your habits. Lifestyle changes may include:  Following the DASH diet. This diet is high in fruits, vegetables, and whole grains. It is low in salt, red meat, and added sugars.  Keep your sodium intake below 2,300 mg per day.  Getting at least 30-45 minutes of aerobic exercise at least 4 times per week.  Losing weight if necessary.  Not smoking.  Limiting alcoholic beverages.  Learning ways to reduce stress. Your health care provider may prescribe medicine if lifestyle changes are not enough to get your blood pressure under control, and if one of the following is true:  You are 30-10 years of age and your systolic blood pressure is above 140.  You are 34 years of age or older, and  your systolic blood pressure is above 150.  Your diastolic blood pressure is above 90.  You have diabetes, and your systolic blood pressure is over [id number] or your diastolic blood pressure is over 90.  You have kidney disease and your blood pressure is above 140/90.  You have heart disease and your blood pressure is above 140/90. Your personal target blood pressure may vary depending on your medical conditions, your age, and other factors. HOME CARE INSTRUCTIONS  Have your blood pressure rechecked as directed by your health care provider.   Take medicines only as directed by your health care provider. Follow the directions carefully. Blood pressure medicines must be taken as prescribed. The medicine does not work as well when you skip doses. Skipping doses also puts you at risk for problems.  Do not smoke.   Monitor your blood pressure at home as directed by your health care provider. SEEK MEDICAL CARE IF:   You think you are having a reaction to medicines taken.  You have recurrent headaches or feel dizzy.  You have swelling in your ankles.  You have trouble with your vision. SEEK IMMEDIATE MEDICAL CARE IF:  You develop a severe headache or confusion.  You have unusual weakness, numbness, or feel faint.  You have severe chest  or abdominal pain.  You vomit repeatedly.  You have trouble breathing. MAKE SURE YOU:   Understand these instructions.  Will watch your condition.  Will get help right away if you are not doing well or get worse.   This information is not intended to replace advice given to you by your health care provider. Make sure you discuss any questions you have with your health care provider.   Document Released: 09/19/2005 Document Revised: 02/03/2015 Document Reviewed: 07/12/2013 Elsevier Interactive Patient Education [employer].

## 2015-09-10 ENCOUNTER — Ambulatory Visit: Payer: Self-pay | Admitting: Family Medicine

## 2016-05-02 ENCOUNTER — Encounter (HOSPITAL_COMMUNITY): Payer: Self-pay | Admitting: *Deleted

## 2016-05-02 ENCOUNTER — Ambulatory Visit (HOSPITAL_COMMUNITY)
Admission: EM | Admit: 2016-05-02 | Discharge: 2016-05-02 | Disposition: A | Discharge status: Home / Self Care | Payer: Self-pay | Attending: Family Medicine | Admitting: Family Medicine

## 2016-05-02 DIAGNOSIS — N5082 Scrotal pain: Secondary | ICD-10-CM

## 2016-05-02 DIAGNOSIS — I10 Essential (primary) hypertension: Secondary | ICD-10-CM

## 2016-05-02 MED ORDER — LISINOPRIL 20 MG PO TABS: 20.0000 mg | ORAL_TABLET | Freq: Every day | ORAL | 1 refills | Status: DC

## 2016-05-02 NOTE — Discharge Instructions (Signed)
Call urologist for appt and further care.

## 2016-05-02 NOTE — ED Provider Notes (Signed)
[facility]    CSN: [id number] Arrival date & time: 05/02/16  1111  First Provider Contact:  First MD Initiated Contact with Patient 05/02/16 1230        History   Chief Complaint Chief Complaint  Patient presents with  . Testicle Pain    HPI Stanley [name] is a 40 y.o. male.   The history is provided by the patient.  Testicle Pain  This is a new problem. The current episode started more than 2 days ago. The problem has been gradually worsening. Pertinent negatives include no abdominal pain. Nothing aggravates the symptoms.    Past Medical History:  Diagnosis Date  . Hypertension     There are no active problems to display for this patient.   History reviewed. No pertinent surgical history.     Home Medications    Prior to Admission medications   Medication Sig Start Date End Date Taking? Authorizing Provider  benazepril (LOTENSIN) 40 MG tablet Take 1 tablet (40 mg total) by mouth daily. 08/24/15  Yes Harvel Quale, MD  amLODipine (NORVASC) 5 MG tablet Take 1 tablet (5 mg total) by mouth daily. 08/24/15   Harvel Quale, MD  ibuprofen (ADVIL,MOTRIN) 600 MG tablet Take 1 tablet (600 mg total) by mouth every 6 (six) hours as needed for moderate pain. 08/24/15   Harvel Quale, MD    Family History History reviewed. No pertinent family history.  Social History Social History  Substance Use Topics  . Smoking status: Current Every Day Smoker  . Smokeless tobacco: Not on file  . Alcohol use Yes     Allergies   Lactose intolerance (gi) and Other   Review of Systems Review of Systems  Constitutional: Negative.  Negative for fever.  Gastrointestinal: Negative.  Negative for abdominal pain, blood in stool and rectal pain.  Genitourinary: Positive for scrotal swelling and testicular pain. Negative for discharge, dysuria, frequency, penile pain and urgency.  Musculoskeletal: Negative.   Skin: Negative.   All other systems reviewed and are  negative.    Physical Exam Triage Vital Signs ED Triage Vitals  Enc Vitals Group     BP 05/02/16 1219 (!) 180/110     Pulse Rate 05/02/16 1219 78     Resp 05/02/16 1219 18     Temp 05/02/16 1219 98.6 F (37 C)     Temp Source 05/02/16 1219 Oral     SpO2 05/02/16 1219 100 %     Weight --      Height --      Head Circumference --      Peak Flow --      Pain Score 05/02/16 1221 8     Pain Loc --      Pain Edu? --      Excl. in [place]? --    No data found.   Updated Vital Signs BP (!) 180/110 (BP Location: Right Arm)   Pulse 78   Temp 98.6 F (37 C) (Oral)   Resp 18   SpO2 100%   Visual Acuity Right Eye Distance:   Left Eye Distance:   Bilateral Distance:    Right Eye Near:   Left Eye Near:    Bilateral Near:     Physical Exam   UC Treatments / Results  Labs (all labs ordered are listed, but only abnormal results are displayed) Labs Reviewed - No data to display  EKG  EKG Interpretation None  Radiology No results found.  Procedures Procedures (including critical care time)  Medications Ordered in UC Medications - No data to display   Initial Impression / Assessment and Plan / UC Course  I have reviewed the triage vital signs and the nursing notes.  Pertinent labs & imaging results that were available during my care of the patient were reviewed by me and considered in my medical decision making (see chart for details).  Clinical Course      Final Clinical Impressions(s) / UC Diagnoses   Final diagnoses:  None    New Prescriptions New Prescriptions   No medications on file     Billy Fischer, MD 05/02/16 1305

## 2016-05-02 NOTE — ED Triage Notes (Signed)
l    Testicle   Swollen       And  painfull

## 2016-09-26 ENCOUNTER — Inpatient Hospital Stay (HOSPITAL_COMMUNITY)
Admission: EM | Admit: 2016-09-26 | Discharge: 2016-10-03 | DRG: 304 | Disposition: A | Discharge status: Home / Self Care | Payer: Self-pay | Attending: Family Medicine | Admitting: Family Medicine

## 2016-09-26 ENCOUNTER — Emergency Department (HOSPITAL_COMMUNITY): Payer: Self-pay

## 2016-09-26 ENCOUNTER — Encounter (HOSPITAL_COMMUNITY): Payer: Self-pay

## 2016-09-26 DIAGNOSIS — I160 Hypertensive urgency: Secondary | ICD-10-CM | POA: Diagnosis present

## 2016-09-26 DIAGNOSIS — I158 Other secondary hypertension: Secondary | ICD-10-CM | POA: Diagnosis present

## 2016-09-26 DIAGNOSIS — T464X6A Underdosing of angiotensin-converting-enzyme inhibitors, initial encounter: Secondary | ICD-10-CM | POA: Diagnosis present

## 2016-09-26 DIAGNOSIS — M62831 Muscle spasm of calf: Secondary | ICD-10-CM | POA: Diagnosis present

## 2016-09-26 DIAGNOSIS — E876 Hypokalemia: Secondary | ICD-10-CM | POA: Diagnosis present

## 2016-09-26 DIAGNOSIS — R480 Dyslexia and alexia: Secondary | ICD-10-CM | POA: Diagnosis present

## 2016-09-26 DIAGNOSIS — I161 Hypertensive emergency: Principal | ICD-10-CM | POA: Diagnosis present

## 2016-09-26 DIAGNOSIS — Z23 Encounter for immunization: Secondary | ICD-10-CM

## 2016-09-26 DIAGNOSIS — R7989 Other specified abnormal findings of blood chemistry: Secondary | ICD-10-CM

## 2016-09-26 DIAGNOSIS — I517 Cardiomegaly: Secondary | ICD-10-CM

## 2016-09-26 DIAGNOSIS — I130 Hypertensive heart and chronic kidney disease with heart failure and stage 1 through stage 4 chronic kidney disease, or unspecified chronic kidney disease: Secondary | ICD-10-CM | POA: Diagnosis present

## 2016-09-26 DIAGNOSIS — Z720 Tobacco use: Secondary | ICD-10-CM | POA: Diagnosis present

## 2016-09-26 DIAGNOSIS — Z599 Problem related to housing and economic circumstances, unspecified: Secondary | ICD-10-CM

## 2016-09-26 DIAGNOSIS — E785 Hyperlipidemia, unspecified: Secondary | ICD-10-CM | POA: Diagnosis present

## 2016-09-26 DIAGNOSIS — Z791 Long term (current) use of non-steroidal anti-inflammatories (NSAID): Secondary | ICD-10-CM

## 2016-09-26 DIAGNOSIS — R778 Other specified abnormalities of plasma proteins: Secondary | ICD-10-CM | POA: Diagnosis present

## 2016-09-26 DIAGNOSIS — N289 Disorder of kidney and ureter, unspecified: Secondary | ICD-10-CM | POA: Diagnosis present

## 2016-09-26 DIAGNOSIS — I10 Essential (primary) hypertension: Secondary | ICD-10-CM | POA: Diagnosis present

## 2016-09-26 DIAGNOSIS — I248 Other forms of acute ischemic heart disease: Secondary | ICD-10-CM | POA: Diagnosis present

## 2016-09-26 DIAGNOSIS — Z833 Family history of diabetes mellitus: Secondary | ICD-10-CM

## 2016-09-26 DIAGNOSIS — N179 Acute kidney failure, unspecified: Secondary | ICD-10-CM | POA: Diagnosis present

## 2016-09-26 DIAGNOSIS — I509 Heart failure, unspecified: Secondary | ICD-10-CM

## 2016-09-26 DIAGNOSIS — E739 Lactose intolerance, unspecified: Secondary | ICD-10-CM | POA: Diagnosis present

## 2016-09-26 DIAGNOSIS — N184 Chronic kidney disease, stage 4 (severe): Secondary | ICD-10-CM | POA: Diagnosis present

## 2016-09-26 DIAGNOSIS — Z91120 Patient's intentional underdosing of medication regimen due to financial hardship: Secondary | ICD-10-CM

## 2016-09-26 DIAGNOSIS — Z8249 Family history of ischemic heart disease and other diseases of the circulatory system: Secondary | ICD-10-CM

## 2016-09-26 DIAGNOSIS — I5033 Acute on chronic diastolic (congestive) heart failure: Secondary | ICD-10-CM | POA: Diagnosis present

## 2016-09-26 DIAGNOSIS — R0600 Dyspnea, unspecified: Secondary | ICD-10-CM

## 2016-09-26 DIAGNOSIS — F17200 Nicotine dependence, unspecified, uncomplicated: Secondary | ICD-10-CM | POA: Diagnosis present

## 2016-09-26 DIAGNOSIS — Z91018 Allergy to other foods: Secondary | ICD-10-CM

## 2016-09-26 DIAGNOSIS — Z79899 Other long term (current) drug therapy: Secondary | ICD-10-CM

## 2016-09-26 HISTORY — DX: Tobacco use: Z72.0

## 2016-09-26 LAB — BASIC METABOLIC PANEL
ANION GAP: 6 (ref 5–15)
BUN: 28 mg/dL — ABNORMAL HIGH (ref 6–20)
CO2: 32 mmol/L (ref 22–32)
Calcium: 9 mg/dL (ref 8.9–10.3)
Chloride: 97 mmol/L — ABNORMAL LOW (ref 101–111)
Creatinine, Ser: 3.18 mg/dL — ABNORMAL HIGH (ref 0.61–1.24)
GFR calc Af Amer: 27 mL/min — ABNORMAL LOW (ref >60)
GFR, EST NON AFRICAN AMERICAN: 23 mL/min — AB (ref >60)
Glucose, Bld: 106 mg/dL — ABNORMAL HIGH (ref 65–99)
POTASSIUM: 3.3 mmol/L — AB (ref 3.5–5.1)
SODIUM: 135 mmol/L (ref 135–145)

## 2016-09-26 LAB — BRAIN NATRIURETIC PEPTIDE: B NATRIURETIC PEPTIDE 5: 2911 pg/mL — AB (ref 0.0–100.0)

## 2016-09-26 LAB — CBC
HCT: 33.4 % — ABNORMAL LOW (ref 39.0–52.0)
Hemoglobin: 10.7 g/dL — ABNORMAL LOW (ref 13.0–17.0)
MCH: 25.1 pg — ABNORMAL LOW (ref 26.0–34.0)
MCHC: 32 g/dL (ref 30.0–36.0)
MCV: 78.4 fL (ref 78.0–100.0)
PLATELETS: 330 10*3/uL (ref 150–400)
RBC: 4.26 MIL/uL (ref 4.22–5.81)
RDW: 15.9 % — ABNORMAL HIGH (ref 11.5–15.5)
WBC: 8.6 10*3/uL (ref 4.0–10.5)

## 2016-09-26 LAB — I-STAT TROPONIN, ED
TROPONIN I, POC: 0.09 ng/mL — AB (ref 0.00–0.08)
TROPONIN I, POC: 0.07 ng/mL (ref 0.00–0.08)

## 2016-09-26 MED ORDER — FUROSEMIDE 10 MG/ML IJ SOLN
80.0000 mg | Freq: Once | INTRAMUSCULAR | Status: AC
  Administered 2016-09-26: 80 mg via INTRAVENOUS
  Filled 2016-09-26: qty 8

## 2016-09-26 MED ORDER — ACETAMINOPHEN 325 MG PO TABS
650.0000 mg | ORAL_TABLET | ORAL | Status: DC | PRN
  Administered 2016-09-29 – 2016-10-02 (×2): 650 mg via ORAL
  Filled 2016-09-26 (×2): qty 2

## 2016-09-26 MED ORDER — ADULT MULTIVITAMIN W/MINERALS CH
1.0000 | ORAL_TABLET | Freq: Every day | ORAL | Status: DC
  Administered 2016-09-27 – 2016-10-03 (×7): 1 via ORAL
  Filled 2016-09-26 (×6): qty 1

## 2016-09-26 MED ORDER — ENOXAPARIN SODIUM 40 MG/0.4ML ~~LOC~~ SOLN
40.0000 mg | SUBCUTANEOUS | Status: DC
  Administered 2016-09-29 – 2016-09-30 (×2): 40 mg via SUBCUTANEOUS
  Filled 2016-09-26 (×4): qty 0.4

## 2016-09-26 MED ORDER — HYDRALAZINE HCL 20 MG/ML IJ SOLN: 10.0000 mg | Freq: Once | INTRAMUSCULAR | Status: DC

## 2016-09-26 MED ORDER — NITROGLYCERIN 0.4 MG SL SUBL: 0.4000 mg | SUBLINGUAL_TABLET | SUBLINGUAL | Status: DC | PRN

## 2016-09-26 MED ORDER — NICOTINE 21 MG/24HR TD PT24
21.0000 mg | MEDICATED_PATCH | TRANSDERMAL | Status: DC
  Administered 2016-09-27 – 2016-10-03 (×7): 21 mg via TRANSDERMAL
  Filled 2016-09-26 (×7): qty 1

## 2016-09-26 MED ORDER — HYDROXYZINE HCL 10 MG PO TABS
10.0000 mg | ORAL_TABLET | Freq: Three times a day (TID) | ORAL | Status: DC | PRN
  Administered 2016-10-02: 10 mg via ORAL
  Filled 2016-09-26 (×2): qty 1

## 2016-09-26 MED ORDER — ASPIRIN 81 MG PO CHEW
324.0000 mg | CHEWABLE_TABLET | Freq: Once | ORAL | Status: AC
  Administered 2016-09-26: 324 mg via ORAL
  Filled 2016-09-26: qty 4

## 2016-09-26 MED ORDER — ONDANSETRON HCL 4 MG/2ML IJ SOLN
4.0000 mg | Freq: Once | INTRAMUSCULAR | Status: AC
  Administered 2016-09-26: 4 mg via INTRAVENOUS
  Filled 2016-09-26: qty 2

## 2016-09-26 MED ORDER — ZOLPIDEM TARTRATE 5 MG PO TABS
5.0000 mg | ORAL_TABLET | Freq: Every evening | ORAL | Status: DC | PRN
  Administered 2016-10-01 – 2016-10-03 (×2): 5 mg via ORAL
  Filled 2016-09-26 (×2): qty 1

## 2016-09-26 MED ORDER — ASPIRIN EC 325 MG PO TBEC
325.0000 mg | DELAYED_RELEASE_TABLET | Freq: Every day | ORAL | Status: DC
  Administered 2016-09-27 – 2016-10-03 (×7): 325 mg via ORAL
  Filled 2016-09-26 (×7): qty 1

## 2016-09-26 MED ORDER — HYDRALAZINE HCL 20 MG/ML IJ SOLN
20.0000 mg | Freq: Once | INTRAMUSCULAR | Status: AC
  Administered 2016-09-26: 20 mg via INTRAVENOUS
  Filled 2016-09-26: qty 1

## 2016-09-26 MED ORDER — ATORVASTATIN CALCIUM 40 MG PO TABS
40.0000 mg | ORAL_TABLET | Freq: Every day | ORAL | Status: DC
  Administered 2016-09-27 – 2016-10-02 (×7): 40 mg via ORAL
  Filled 2016-09-26 (×7): qty 1

## 2016-09-26 MED ORDER — FUROSEMIDE 10 MG/ML IJ SOLN: 40.0000 mg | Freq: Every day | INTRAMUSCULAR | Status: DC

## 2016-09-26 MED ORDER — ASPIRIN 81 MG PO CHEW: 324.0000 mg | CHEWABLE_TABLET | Freq: Every day | ORAL | Status: DC

## 2016-09-26 MED ORDER — POTASSIUM CHLORIDE CRYS ER 20 MEQ PO TBCR
40.0000 meq | EXTENDED_RELEASE_TABLET | Freq: Once | ORAL | Status: AC
  Administered 2016-09-26: 40 meq via ORAL
  Filled 2016-09-26: qty 2

## 2016-09-26 MED ORDER — SODIUM CHLORIDE 0.9% FLUSH: 3.0000 mL | Freq: Two times a day (BID) | INTRAVENOUS | Status: DC

## 2016-09-26 MED ORDER — SODIUM CHLORIDE 0.9 % IV SOLN: 250.0000 mL | INTRAVENOUS | Status: DC | PRN

## 2016-09-26 MED ORDER — MORPHINE SULFATE (PF) 4 MG/ML IV SOLN
2.0000 mg | INTRAVENOUS | Status: DC | PRN
  Administered 2016-09-27: 2 mg via INTRAVENOUS
  Filled 2016-09-26: qty 1

## 2016-09-26 MED ORDER — ONDANSETRON HCL 4 MG/2ML IJ SOLN
4.0000 mg | Freq: Four times a day (QID) | INTRAMUSCULAR | Status: DC | PRN
  Administered 2016-09-29: 4 mg via INTRAVENOUS
  Filled 2016-09-26: qty 2

## 2016-09-26 MED ORDER — SODIUM CHLORIDE 0.9% FLUSH: 3.0000 mL | INTRAVENOUS | Status: DC | PRN

## 2016-09-26 MED ORDER — NITROGLYCERIN 2 % TD OINT
1.0000 [in_us] | TOPICAL_OINTMENT | Freq: Once | TRANSDERMAL | Status: AC
  Administered 2016-09-26: 1 [in_us] via TOPICAL
  Filled 2016-09-26: qty 1

## 2016-09-26 MED ORDER — AMLODIPINE BESYLATE 10 MG PO TABS
10.0000 mg | ORAL_TABLET | Freq: Every day | ORAL | Status: DC
  Administered 2016-09-27 – 2016-10-03 (×8): 10 mg via ORAL
  Filled 2016-09-26 (×3): qty 1
  Filled 2016-09-26: qty 2
  Filled 2016-09-26 (×4): qty 1

## 2016-09-26 MED ORDER — NITROGLYCERIN IN D5W 200-5 MCG/ML-% IV SOLN
40.0000 ug/min | INTRAVENOUS | Status: DC
  Administered 2016-09-26: 40 ug/min via INTRAVENOUS
  Filled 2016-09-26 (×2): qty 250

## 2016-09-26 NOTE — H&P (Addendum)
History and Physical    [name]:[id number] DOB: December 19, 1975 DOA: 09/26/2016  Referring MD/NP/PA:   PCP: No PCP Per Patient   Patient coming from:  The patient is coming from home.  At baseline, pt is independent for most of ADL.   Chief Complaint: Shortness of breath, chest pain, headache  HPI: Stanley [name] is a 40 y.o. male with medical history significant of hypertension, tobacco abuse, med noncompliance, who presents with shortness of breath, chest pain and headache.  Patient states that he ran out of his blood pressure medications for more than 2 month due to lack of insurance. He developed shortness of breath, chest pain and headache since yesterday, which have been progressively getting worse. His chest pain is located in the lower chest, constant, 6 out of 10 in severity, nonradiating. He also has dry cough, but no fever or chills. No runny nose or sore throat. He has orthopnea, no leg edema. His nausea, but no vomiting, diarrhea or abdominal pain. Denies symptoms of UTI or unilateral weakness.  ED Course: pt was found to have blood pressure 265/168, WBC 8.6, BNP 2911, troponin 0.09, WBC 8.6, potassium 3.3, AKI with cre 3.18. Chest history of mild cardiomegaly. Pt is admitted to stepdown bed as inpatient.  Review of Systems:   General: no fevers, chills, no changes in body weight, has poor appetite, has fatigue HEENT: no blurry vision, hearing changes or sore throat Respiratory: has dyspnea, coughing, no wheezing CV: has chest pain, no palpitations GI: has nausea, no vomiting, abdominal pain, diarrhea, constipation GU: no dysuria, burning on urination, increased urinary frequency, hematuria  Ext: no leg edema Neuro: no unilateral weakness, numbness, or tingling, no vision change or hearing loss Skin: no rash, no skin tear. MSK: No muscle spasm, no deformity, no limitation of range of movement in spin Heme: No easy bruising.  Travel history: No recent long distant  travel.  Allergy:  Allergies  Allergen Reactions  . Lactose Intolerance (Gi) Nausea And Vomiting  . Other Swelling    All beans    Past Medical History:  Diagnosis Date  . Hypertension   . Tobacco abuse     History reviewed. No pertinent surgical history.  Social History:  reports that he has been smoking.  He has never used smokeless tobacco. He reports that he drinks alcohol. He reports that he does not use drugs.  Family History:  Family History  Problem Relation Age of Onset  . Hypertension Mother   . Hypertension Brother   . Diabetes type II Brother      Prior to Admission medications   Medication Sig Start Date End Date Taking? Authorizing Provider  amLODipine (NORVASC) 5 MG tablet Take 1 tablet (5 mg total) by mouth daily. 08/24/15   Harvel Quale, MD  benazepril (LOTENSIN) 40 MG tablet Take 1 tablet (40 mg total) by mouth daily. 08/24/15   Harvel Quale, MD  ibuprofen (ADVIL,MOTRIN) 600 MG tablet Take 1 tablet (600 mg total) by mouth every 6 (six) hours as needed for moderate pain. 08/24/15   Harvel Quale, MD  lisinopril (PRINIVIL,ZESTRIL) 20 MG tablet Take 1 tablet (20 mg total) by mouth daily. 05/02/16   Billy Fischer, MD    Physical Exam: Vitals:   09/26/16 2230 09/26/16 2245 09/26/16 2300 09/26/16 2315  BP: (!) 262/158 (!) 232/136 (!) 232/121 (!) 210/120  Pulse: 95 97 101 109  Resp: 19 22 20  (!) 31  Temp:  TempSrc:      SpO2: 98% 98% 98% 99%   General: in moderate acute distress HEENT:       Eyes: PERRL, EOMI, no scleral icterus.       ENT: No discharge from the ears and nose, no pharynx injection, no tonsillar enlargement.        Neck: No JVD, no bruit, no mass felt. Heme: No neck lymph node enlargement. Cardiac: S1/S2, RRR, No murmurs, No gallops or rubs. Respiratory: No rales, wheezing, rhonchi or rubs. GI: Soft, nondistended, nontender, no rebound pain, no organomegaly, BS present. GU: No hematuria Ext: No pitting leg edema  bilaterally. 2+DP/PT pulse bilaterally. Musculoskeletal: No joint deformities, No joint redness or warmth, no limitation of ROM in spin. Skin: No rashes.  Neuro: Alert, oriented X3, cranial nerves II-XII grossly intact, moves all extremities normally.  Psych: Patient is not psychotic, no suicidal or hemocidal ideation.  Labs on Admission: I have personally reviewed following labs and imaging studies  CBC:  Recent Labs Lab 09/26/16 2030  WBC 8.6  HGB 10.7*  HCT 33.4*  MCV 78.4  PLT 650   Basic Metabolic Panel:  Recent Labs Lab 09/26/16 2030  NA 135  K 3.3*  CL 97*  CO2 32  GLUCOSE 106*  BUN 28*  CREATININE 3.18*  CALCIUM 9.0   GFR: CrCl cannot be calculated (Unknown ideal weight.). Liver Function Tests: No results for input(s): AST, ALT, ALKPHOS, BILITOT, PROT, ALBUMIN in the last 168 hours. No results for input(s): LIPASE, AMYLASE in the last 168 hours. No results for input(s): AMMONIA in the last 168 hours. Coagulation Profile: No results for input(s): INR, PROTIME in the last 168 hours. Cardiac Enzymes: No results for input(s): CKTOTAL, CKMB, CKMBINDEX, TROPONINI in the last 168 hours. BNP (last 3 results) No results for input(s): PROBNP in the last 8760 hours. HbA1C: No results for input(s): HGBA1C in the last 72 hours. CBG: No results for input(s): GLUCAP in the last 168 hours. Lipid Profile: No results for input(s): CHOL, HDL, LDLCALC, TRIG, CHOLHDL, LDLDIRECT in the last 72 hours. Thyroid Function Tests: No results for input(s): TSH, T4TOTAL, FREET4, T3FREE, THYROIDAB in the last 72 hours. Anemia Panel: No results for input(s): VITAMINB12, FOLATE, FERRITIN, TIBC, IRON, RETICCTPCT in the last 72 hours. Urine analysis:    Component Value Date/Time   COLORURINE YELLOW 09/29/2009 1630   APPEARANCEUR CLEAR 09/29/2009 1630   LABSPEC 1.031 (H) 09/29/2009 1630   PHURINE 5.5 09/29/2009 1630   GLUCOSEU NEGATIVE 09/29/2009 1630   HGBUR NEGATIVE 09/29/2009  1630   BILIRUBINUR NEGATIVE 09/29/2009 1630   KETONESUR NEGATIVE 09/29/2009 1630   PROTEINUR NEGATIVE 09/29/2009 1630   UROBILINOGEN 0.2 09/29/2009 1630   NITRITE NEGATIVE 09/29/2009 1630   LEUKOCYTESUR  09/29/2009 1630    NEGATIVE MICROSCOPIC NOT DONE ON URINES WITH NEGATIVE PROTEIN, BLOOD, LEUKOCYTES, NITRITE, OR GLUCOSE <1000 mg/dL.   Sepsis Labs: @LABRCNTIP (procalcitonin:4,lacticidven:4) )No results found for this or any previous visit (from the past 240 hour(s)).   Radiological Exams on Admission: Dg Chest Portable 1 View  Result Date: 09/26/2016 CLINICAL DATA:  Dyspnea.  Cough and congestion. EXAM: PORTABLE CHEST 1 VIEW COMPARISON:  Frontal and lateral views 05/19/2016 FINDINGS: Mild cardiomegaly, likely accentuated by portable AP technique. Normal mediastinal contours. No pulmonary edema. No focal airspace opacity, large pleural effusion or pneumothorax. No acute osseous abnormality is seen. IMPRESSION: Mild cardiomegaly. No congestive failure or acute pulmonary abnormality. Electronically Signed   By: Jeb Levering M.D.   On: 09/26/2016 21:20  EKG: Independently reviewed.  Not done in ED, will get one.   Assessment/Plan Principal Problem:   Hypertensive emergency Active Problems:   Acute CHF (HCC)   Tobacco abuse   Elevated troponin   Hypertension   Hypokalemia   AKI (acute kidney injury) ([place])   Hypertensive emergency: His blood pressure is significant elevated at 265/168 with acute CHF and AKI, consistent hypertensive emergency. No signs of stroke.  this is due to medication noncompliance. Patient was treated with Lasix 80 mg 1, IV hydralazine, and nitroglycerin patch in ED without significant improvement. He was then started with nitroglycerin gtt.  EDP discussed with cardiologist, Dr. Sung Amabile, recommended admission to stepdown. Please call back if needed.  -Will admit to SDU -Continue Nitroglycerin drip for now, the of goal of bp reduction is by about 25 to  30% tonight - start amlodipine 10 mg daily and clonidine 0.2 mg tid  Acute CHF: Patient has elevated BNP 2911, shortness of breath and orthopnea, consistent with acute CHF and flush of pulmonary edema. No previous 2-D echo in record. This is likely due to acute diastolic CHF given uncontrolled blood pressure though systolic CHF cannot be completely ruled out. Will get a 2-D echo for further evaluation. -pt received 80 mg of Lasix in ED, will continue 40 mg daily -2d echo  Elevated troponin: Likely due to demand ischemia secondary to hypertensive emergency -trop x 3 -2d echo -Risk factor stratification: A1c, FLP, UDS -ASA -start lipitor 40 mg daily -prn morphine for CP. -on NTG gtt as above  Tobacco abuse: -Did counseling about importance of quitting smoking -Nicotine patch  Hypokalemia: K= 3.3 on admission. - Repleted  AKI (acute kidney injury): cre 3.18, most likely due to hypertensive emergency - treat hypertensive emergency as above - will not resume his home ACEI or ARB - Check FeUrea and UA - Follow up renal function by BMP - Avoid  NSAIDs   DVT ppx: SQ Lovenox Code Status: Full code Family Communication: Yes, patient's mother and girlfriend at bed side Disposition Plan:  Anticipate discharge back to previous home environment Consults called:  EDP discussed with cardiologist, Dr. Sung Amabile. Please call back if needed. Admission status: Obs / tele  Inpatient/tele   medical floor/obs     SDU/inpation       Date of Service 09/26/2016    Ivor Costa [facility] Pager [phone]  If 7PM-7AM, please contact night-coverage [email or web address] Password [facility] 09/26/2016, 11:34 PM

## 2016-09-26 NOTE — ED Notes (Signed)
Nitro pad removed per Dr

## 2016-09-26 NOTE — ED Provider Notes (Signed)
[facility] DEPT Provider Note   CSN: [id number] Arrival date & time: 09/26/16  6967     History   Chief Complaint Chief Complaint  Patient presents with  . Cough  . Generalized Body Aches    HPI Stanley [name] is a 40 y.o. male.  Patient presents with worsening cough congestion shortness of breath for the past week. Patient had subjective fever. Patient's had significant shortness of breath with lying flat. Patient said uncontrolled high blood pressure due to financial issues and is not taking lisinopril. No known cardiac or blood clot history. No blood cultures factors.      Past Medical History:  Diagnosis Date  . Hypertension   . Tobacco abuse     Patient Active Problem List   Diagnosis Date Noted  . Hypertensive emergency 09/26/2016  . Acute CHF ([place]) 09/26/2016  . Tobacco abuse 09/26/2016  . Elevated troponin 09/26/2016  . Hypertension 09/26/2016    History reviewed. No pertinent surgical history.     Home Medications    Prior to Admission medications   Medication Sig Start Date End Date Taking? Authorizing Provider  amLODipine (NORVASC) 5 MG tablet Take 1 tablet (5 mg total) by mouth daily. 08/24/15   Harvel Quale, MD  benazepril (LOTENSIN) 40 MG tablet Take 1 tablet (40 mg total) by mouth daily. 08/24/15   Harvel Quale, MD  ibuprofen (ADVIL,MOTRIN) 600 MG tablet Take 1 tablet (600 mg total) by mouth every 6 (six) hours as needed for moderate pain. 08/24/15   Harvel Quale, MD  lisinopril (PRINIVIL,ZESTRIL) 20 MG tablet Take 1 tablet (20 mg total) by mouth daily. 05/02/16   Billy Fischer, MD    Family History History reviewed. No pertinent family history.  Social History Social History  Substance Use Topics  . Smoking status: Current Every Day Smoker  . Smokeless tobacco: Never Used  . Alcohol use Yes     Allergies   Lactose intolerance (gi) and Other   Review of Systems Review of Systems  Constitutional: Positive for fever.  Negative for chills.  HENT: Negative for congestion.   Eyes: Negative for visual disturbance.  Respiratory: Positive for cough and shortness of breath.   Cardiovascular: Positive for chest pain (nonradiatingchest pressure worse with lying flat).  Gastrointestinal: Negative for abdominal pain and vomiting.  Genitourinary: Negative for dysuria and flank pain.  Musculoskeletal: Negative for back pain, neck pain and neck stiffness.  Skin: Negative for rash.  Neurological: Negative for light-headedness and headaches.     Physical Exam Updated Vital Signs BP (!) 257/149   Pulse 94   Temp 98.5 F (36.9 C) (Oral)   Resp 19   SpO2 99%   Physical Exam  Constitutional: He appears well-developed and well-nourished.  HENT:  Head: Normocephalic and atraumatic.  Eyes: Conjunctivae are normal.  Neck: Neck supple.  Cardiovascular: Normal rate and regular rhythm.   No murmur heard. Pulmonary/Chest: No respiratory distress (mild increased effort). He has rales (at bases bilateral mild).  Abdominal: Soft. There is no tenderness.  Musculoskeletal: He exhibits no edema.  Neurological: He is alert.  Skin: Skin is warm and dry.  Psychiatric: He has a normal mood and affect.  Nursing note and vitals reviewed.    ED Treatments / Results  Labs (all labs ordered are listed, but only abnormal results are displayed) Labs Reviewed  CBC - Abnormal; Notable for the following:       Result Value   Hemoglobin 10.7 (*)  HCT 33.4 (*)    MCH 25.1 (*)    RDW 15.9 (*)    All other components within normal limits  BASIC METABOLIC PANEL - Abnormal; Notable for the following:    Potassium 3.3 (*)    Chloride 97 (*)    Glucose, Bld 106 (*)    BUN 28 (*)    Creatinine, Ser 3.18 (*)    GFR calc non Af Amer 23 (*)    GFR calc Af Amer 27 (*)    All other components within normal limits  BRAIN NATRIURETIC PEPTIDE - Abnormal; Notable for the following:    B Natriuretic Peptide 2,911.0 (*)    All other  components within normal limits  I-STAT TROPOININ, ED - Abnormal; Notable for the following:    Troponin i, poc 0.09 (*)    All other components within normal limits    EKG  EKG Interpretation  Date/Time:  Monday September 26 2016 21:31:29 EST Ventricular Rate:  96 PR Interval:    QRS Duration: 90 QT Interval:  406 QTC Calculation: 514 R Axis:   14 Text Interpretation:  Sinus rhythm LVH with secondary repolarization abnormality Prolonged QT interval Confirmed by Reather Converse MD, Lalania Haseman [phone]) on 09/26/2016 9:41:20 PM       Radiology Dg Chest Portable 1 View  Result Date: 09/26/2016 CLINICAL DATA:  Dyspnea.  Cough and congestion. EXAM: PORTABLE CHEST 1 VIEW COMPARISON:  Frontal and lateral views 05/19/2016 FINDINGS: Mild cardiomegaly, likely accentuated by portable AP technique. Normal mediastinal contours. No pulmonary edema. No focal airspace opacity, large pleural effusion or pneumothorax. No acute osseous abnormality is seen. IMPRESSION: Mild cardiomegaly. No congestive failure or acute pulmonary abnormality. Electronically Signed   By: Jeb Levering M.D.   On: 09/26/2016 21:20    Procedures Procedures (including critical care time) CRITICAL CARE Performed by: Mariea Clonts   Total critical care time: 75 minutes  Critical care time was exclusive of separately billable procedures and treating other patients.  Critical care was necessary to treat or prevent imminent or life-threatening deterioration.  Critical care was time spent personally by me on the following activities: development of treatment plan with patient and/or surrogate as well as nursing, discussions with consultants, evaluation of patient's response to treatment, examination of patient, obtaining history from patient or surrogate, ordering and performing treatments and interventions, ordering and review of laboratory studies, ordering and review of radiographic studies, pulse oximetry and re-evaluation of  patient's condition.    EMERGENCY DEPARTMENT [place] CARDIAC EXAM "Study: Limited Ultrasound of the heart and pericardium"  INDICATIONS:Dyspnea Multiple views of the heart and pericardium were obtained in real-time with a multi-frequency probe.  PERFORMED TJ:QZESPQ  IMAGES ARCHIVED?: Yes  FINDINGS: No pericardial effusion, Decreased contractility and IVC dilated  LIMITATIONS:  Body habitus  VIEWS USED: Subcostal 4 chamber, Parasternal long axis, Parasternal short axis, Apical 4 chamber  and Inferior Vena Cava  INTERPRETATION: Cardiac activity present, Pericardial effusioin absent, Probable elevated CVP and Decreased contractility  CPT Code: 33007-62 (limited transthoracic cardiac)  EMERGENCY DEPARTMENT [place] LUNG EXAM "Study: Limited Ultrasound of the lung and thorax"   INDICATIONS: Dyspnea  Multiple views of both lungs using sagittal orientation were obtained.  PERFORMED BY: Myself  IMAGES ARCHIVED?: Yes  FINDINGS: B lines  LIMITATIONS: Body habitus  VIEWS USED: Posterior Lung Fields  INTERPRETATION: Pulmonary Edema  CPT Code: 26333-54 (limited transthoracic lung)   Medications Ordered in ED Medications  nitroGLYCERIN (NITROSTAT) SL tablet 0.4 mg (not administered)  nitroGLYCERIN 50 mg  in dextrose 5 % 250 mL (0.2 mg/mL) infusion (100 mcg/min Intravenous Rate/Dose Change 09/26/16 2240)  hydrALAZINE (APRESOLINE) injection 20 mg (not administered)  nitroGLYCERIN (NITROGLYN) 2 % ointment 1 inch (1 inch Topical Given 09/26/16 2127)  aspirin chewable tablet 324 mg (324 mg Oral Given 09/26/16 2154)  furosemide (LASIX) injection 80 mg (80 mg Intravenous Given 09/26/16 2210)  potassium chloride SA (K-DUR,KLOR-CON) CR tablet 40 mEq (40 mEq Oral Given 09/26/16 2211)  hydrALAZINE (APRESOLINE) injection 20 mg (20 mg Intravenous Given 09/26/16 2211)  ondansetron (ZOFRAN) injection 4 mg (4 mg Intravenous Given 09/26/16 2239)     Initial Impression / Assessment and Plan / ED  Course  I have reviewed the triage vital signs and the nursing notes.  Pertinent labs & imaging results that were available during my care of the patient were reviewed by me and considered in my medical decision making (see chart for details).  Clinical Course    Patient presents with clinical concern for new onset CHF with uncontrolled high blood pressure, significant elevated blood pressure in the ER, crackles at the bases. Bedside ultrasound confirmed LVH, decreased ejection fraction and B-lines on lung exam. Nitro drip ordered, aspirin ordered, blood work and plan for admission.  Patient's blood pressure difficult to control. IV Lasix and nitro drip ordered and titrated up to 100 g. Discuss with cardiology recommended triad admit to stepdown and they will be on consult if needed. Aspirin given.   The patients results and plan were reviewed and discussed.   Any x-rays performed were independently reviewed by myself.   Differential diagnosis were considered with the presenting HPI.  Medications  nitroGLYCERIN (NITROSTAT) SL tablet 0.4 mg (not administered)  nitroGLYCERIN 50 mg in dextrose 5 % 250 mL (0.2 mg/mL) infusion (100 mcg/min Intravenous Rate/Dose Change 09/26/16 2240)  hydrALAZINE (APRESOLINE) injection 20 mg (not administered)  nitroGLYCERIN (NITROGLYN) 2 % ointment 1 inch (1 inch Topical Given 09/26/16 2127)  aspirin chewable tablet 324 mg (324 mg Oral Given 09/26/16 2154)  furosemide (LASIX) injection 80 mg (80 mg Intravenous Given 09/26/16 2210)  potassium chloride SA (K-DUR,KLOR-CON) CR tablet 40 mEq (40 mEq Oral Given 09/26/16 2211)  hydrALAZINE (APRESOLINE) injection 20 mg (20 mg Intravenous Given 09/26/16 2211)  ondansetron (ZOFRAN) injection 4 mg (4 mg Intravenous Given 09/26/16 2239)    Vitals:   09/26/16 2147 09/26/16 2200 09/26/16 2215 09/26/16 2222  BP:  (!) 265/168 (!) 265/153 (!) 257/149  Pulse: 96 95 96 94  Resp: (!) 32 14 21 19   Temp:      TempSrc:        SpO2: 97% 97% 98% 99%    Final diagnoses:  Essential hypertension  Acute congestive heart failure, unspecified congestive heart failure type (HCC)  Acute dyspnea  LVH (left ventricular hypertrophy)    Admission/ observation were discussed with the admitting physician, patient and/or family and they are comfortable with the plan.   Final Clinical Impressions(s) / ED Diagnoses   Final diagnoses:  Essential hypertension  Acute congestive heart failure, unspecified congestive heart failure type (HCC)  Acute dyspnea  LVH (left ventricular hypertrophy)    New Prescriptions New Prescriptions   No medications on file     Elnora Morrison, MD 09/26/16 2250

## 2016-09-26 NOTE — ED Notes (Signed)
Pt hypertensive at triage. Pt states used to take lisinopril, unable to afford medication.

## 2016-09-26 NOTE — ED Triage Notes (Signed)
Pt complaining of cough and congestion. Pt states increased SOB when lying. Pt states dry cough. Pt complaining of fevers and chills. Pt states increased relief with humidity.

## 2016-09-26 NOTE — ED Notes (Signed)
Pt says he was on lisinopril but w/ current insurance can no longer afford it. Pt denies headache, dizziness, or lightheadedness.

## 2016-09-26 NOTE — ED Notes (Signed)
Admitting at bedside 

## 2016-09-27 ENCOUNTER — Other Ambulatory Visit (HOSPITAL_COMMUNITY): Payer: Self-pay

## 2016-09-27 DIAGNOSIS — N179 Acute kidney failure, unspecified: Secondary | ICD-10-CM

## 2016-09-27 DIAGNOSIS — I161 Hypertensive emergency: Principal | ICD-10-CM

## 2016-09-27 DIAGNOSIS — I509 Heart failure, unspecified: Secondary | ICD-10-CM

## 2016-09-27 DIAGNOSIS — R0600 Dyspnea, unspecified: Secondary | ICD-10-CM

## 2016-09-27 DIAGNOSIS — I160 Hypertensive urgency: Secondary | ICD-10-CM | POA: Diagnosis present

## 2016-09-27 LAB — URINALYSIS, ROUTINE W REFLEX MICROSCOPIC
BACTERIA UA: NONE SEEN
Bilirubin Urine: NEGATIVE
GLUCOSE, UA: 50 mg/dL — AB
Hgb urine dipstick: NEGATIVE
Ketones, ur: NEGATIVE mg/dL
Leukocytes, UA: NEGATIVE
Nitrite: NEGATIVE
SQUAMOUS EPITHELIAL / LPF: NONE SEEN
Specific Gravity, Urine: 1.01 (ref 1.005–1.030)
pH: 7 (ref 5.0–8.0)

## 2016-09-27 LAB — HEMOGLOBIN A1C
Hgb A1c MFr Bld: 4.8 % (ref 4.8–5.6)
Mean Plasma Glucose: 91 mg/dL

## 2016-09-27 LAB — RAPID URINE DRUG SCREEN, HOSP PERFORMED
AMPHETAMINES: NOT DETECTED
BENZODIAZEPINES: NOT DETECTED
Barbiturates: NOT DETECTED
Cocaine: NOT DETECTED
OPIATES: NOT DETECTED
Tetrahydrocannabinol: POSITIVE — AB

## 2016-09-27 LAB — LIPID PANEL
CHOL/HDL RATIO: 4.3 ratio
Cholesterol: 132 mg/dL (ref 0–200)
HDL: 31 mg/dL — ABNORMAL LOW (ref >40)
LDL CALC: 87 mg/dL (ref 0–99)
Triglycerides: 68 mg/dL (ref <150)
VLDL: 14 mg/dL (ref 0–40)

## 2016-09-27 LAB — MAGNESIUM: MAGNESIUM: 2.1 mg/dL (ref 1.7–2.4)

## 2016-09-27 LAB — CBC
HCT: 33.1 % — ABNORMAL LOW (ref 39.0–52.0)
Hemoglobin: 10.7 g/dL — ABNORMAL LOW (ref 13.0–17.0)
MCH: 25.3 pg — ABNORMAL LOW (ref 26.0–34.0)
MCHC: 32.3 g/dL (ref 30.0–36.0)
MCV: 78.3 fL (ref 78.0–100.0)
PLATELETS: 350 10*3/uL (ref 150–400)
RBC: 4.23 MIL/uL (ref 4.22–5.81)
RDW: 16.3 % — AB (ref 11.5–15.5)
WBC: 10.1 10*3/uL (ref 4.0–10.5)

## 2016-09-27 LAB — BASIC METABOLIC PANEL
Anion gap: 14 (ref 5–15)
Anion gap: 15 (ref 5–15)
BUN: 32 mg/dL — AB (ref 6–20)
BUN: 32 mg/dL — AB (ref 6–20)
CALCIUM: 9.8 mg/dL (ref 8.9–10.3)
CHLORIDE: 96 mmol/L — AB (ref 101–111)
CO2: 25 mmol/L (ref 22–32)
CO2: 25 mmol/L (ref 22–32)
CREATININE: 3.15 mg/dL — AB (ref 0.61–1.24)
CREATININE: 3.16 mg/dL — AB (ref 0.61–1.24)
Calcium: 9.4 mg/dL (ref 8.9–10.3)
Chloride: 95 mmol/L — ABNORMAL LOW (ref 101–111)
GFR calc Af Amer: 27 mL/min — ABNORMAL LOW (ref >60)
GFR calc Af Amer: 27 mL/min — ABNORMAL LOW (ref >60)
GFR calc non Af Amer: 23 mL/min — ABNORMAL LOW (ref >60)
GFR calc non Af Amer: 23 mL/min — ABNORMAL LOW (ref >60)
Glucose, Bld: 142 mg/dL — ABNORMAL HIGH (ref 65–99)
Glucose, Bld: 118 mg/dL — ABNORMAL HIGH (ref 65–99)
POTASSIUM: 3.4 mmol/L — AB (ref 3.5–5.1)
Potassium: 3.2 mmol/L — ABNORMAL LOW (ref 3.5–5.1)
SODIUM: 135 mmol/L (ref 135–145)
SODIUM: 135 mmol/L (ref 135–145)

## 2016-09-27 LAB — TSH: TSH: 1.233 u[IU]/mL (ref 0.350–4.500)

## 2016-09-27 LAB — T4, FREE: FREE T4: 1.38 ng/dL — AB (ref 0.61–1.12)

## 2016-09-27 LAB — I-STAT TROPONIN, ED: TROPONIN I, POC: 0.07 ng/mL (ref 0.00–0.08)

## 2016-09-27 LAB — MRSA PCR SCREENING: MRSA BY PCR: NEGATIVE

## 2016-09-27 LAB — PHOSPHORUS: PHOSPHORUS: 4.4 mg/dL (ref 2.5–4.6)

## 2016-09-27 LAB — CREATININE, URINE, RANDOM: CREATININE, URINE: 64.1 mg/dL

## 2016-09-27 MED ORDER — CLONIDINE HCL 0.2 MG PO TABS
0.2000 mg | ORAL_TABLET | Freq: Three times a day (TID) | ORAL | Status: DC
  Administered 2016-09-27 – 2016-10-03 (×20): 0.2 mg via ORAL
  Filled 2016-09-27 (×7): qty 1
  Filled 2016-09-27: qty 2
  Filled 2016-09-27 (×6): qty 1
  Filled 2016-09-27: qty 2
  Filled 2016-09-27 (×4): qty 1
  Filled 2016-09-27: qty 2

## 2016-09-27 MED ORDER — INFLUENZA VAC SPLIT QUAD 0.5 ML IM SUSY
0.5000 mL | PREFILLED_SYRINGE | INTRAMUSCULAR | Status: AC
  Administered 2016-09-28: 0.5 mL via INTRAMUSCULAR
  Filled 2016-09-27: qty 0.5

## 2016-09-27 MED ORDER — NICARDIPINE HCL IN NACL 20-0.86 MG/200ML-% IV SOLN
3.0000 mg/h | INTRAVENOUS | Status: DC
  Administered 2016-09-27: 7.5 mg/h via INTRAVENOUS
  Administered 2016-09-27 (×2): 5 mg/h via INTRAVENOUS
  Filled 2016-09-27 (×2): qty 200

## 2016-09-27 MED ORDER — NICARDIPINE HCL IN NACL 20-0.86 MG/200ML-% IV SOLN
INTRAVENOUS | Status: AC
  Filled 2016-09-27: qty 200

## 2016-09-27 MED ORDER — METOPROLOL TARTRATE 50 MG PO TABS
50.0000 mg | ORAL_TABLET | Freq: Two times a day (BID) | ORAL | Status: DC
  Administered 2016-09-27 – 2016-09-28 (×3): 50 mg via ORAL
  Filled 2016-09-27 (×3): qty 1

## 2016-09-27 MED ORDER — HYDRALAZINE HCL 20 MG/ML IJ SOLN
10.0000 mg | INTRAMUSCULAR | Status: DC | PRN
  Administered 2016-09-29 – 2016-10-01 (×2): 20 mg via INTRAVENOUS
  Filled 2016-09-27 (×2): qty 1

## 2016-09-27 MED ORDER — LABETALOL HCL 5 MG/ML IV SOLN
10.0000 mg | Freq: Once | INTRAVENOUS | Status: AC
  Administered 2016-09-27: 10 mg via INTRAVENOUS

## 2016-09-27 MED ORDER — LABETALOL HCL 5 MG/ML IV SOLN
20.0000 mg | INTRAVENOUS | Status: DC | PRN
  Administered 2016-09-28: 20 mg via INTRAVENOUS
  Filled 2016-09-27: qty 4

## 2016-09-27 MED ORDER — LABETALOL HCL 5 MG/ML IV SOLN
INTRAVENOUS | Status: AC
  Filled 2016-09-27: qty 4

## 2016-09-27 MED ORDER — POTASSIUM CHLORIDE CRYS ER 20 MEQ PO TBCR
40.0000 meq | EXTENDED_RELEASE_TABLET | Freq: Once | ORAL | Status: AC
  Administered 2016-09-27: 40 meq via ORAL
  Filled 2016-09-27: qty 2

## 2016-09-27 NOTE — Progress Notes (Signed)
PULMONARY / CRITICAL CARE MEDICINE   Name: Stanley [name] MRN: [id number] DOB: 1976-03-12    ADMISSION DATE:  09/26/2016 CONSULTATION DATE:  09/27/16  REFERRING MD:  Blaine Hamper - TRH  CHIEF COMPLAINT:  Headache, SOB  HISTORY OF PRESENT ILLNESS:  Stanley [name] is a 40 y.o. male with PMH as outlined below including uncontrolled HTN due to medication non-compliance.  He states he has been out of meds for at least 2 months (maybe more) due to lack of insurance.  He had felt fine up until a few days prior to 12/25 when he began to have SOB, chest tightness, dry cough, headache.  Symptoms persisted and worsened and on 12/25, he decided to come to ED for further evaluation.  He was found to have BP of 265/168.  Initially admitted by [facility] and started on nitroglycerin gtt as well as PRN Hydralazine and Labetalol.  After several hours and high doses of nitro, BP still in 220-230 range; therefore, he was switched over to cardene.  Within 30 minutes at dose of 68mcg/hr, SBP had dropped into 160-165 range. Goal SBP is 185-195 based on ~25% reduction.  Cardene dropped down to 40mcg/hr and pt admitted to ICU.  PAST MEDICAL HISTORY :  He  has a past medical history of Hypertension and Tobacco abuse.  PAST SURGICAL HISTORY: He  has no past surgical history on file.  Allergies  Allergen Reactions  . Lactose Intolerance (Gi) Nausea And Vomiting  . Other Swelling    All beans    No current facility-administered medications on file prior to encounter.    Current Outpatient Prescriptions on File Prior to Encounter  Medication Sig  . amLODipine (NORVASC) 5 MG tablet Take 1 tablet (5 mg total) by mouth daily. (Patient not taking: Reported on 09/26/2016)  . benazepril (LOTENSIN) 40 MG tablet Take 1 tablet (40 mg total) by mouth daily. (Patient not taking: Reported on 09/26/2016)  . lisinopril (PRINIVIL,ZESTRIL) 20 MG tablet Take 1 tablet (20 mg total) by mouth daily. (Patient not taking: Reported on 09/26/2016)     FAMILY HISTORY:  His indicated that the status of his mother is unknown. He indicated that the status of his brother is unknown.    SOCIAL HISTORY: He  reports that he has been smoking.  He has never used smokeless tobacco. He reports that he drinks alcohol. He reports that he does not use drugs.  REVIEW OF SYSTEMS:   All negative; except for those that are bolded, which indicate positives.  Constitutional: weight loss, weight gain, night sweats, fevers, chills, fatigue, weakness.  HEENT: headaches, sore throat, sneezing, nasal congestion, post nasal drip, difficulty swallowing, tooth/dental problems, visual complaints, visual changes, ear aches. Neuro: difficulty with speech, weakness, numbness, ataxia, headache. CV:  chest tightness, orthopnea, PND, swelling in lower extremities, dizziness, palpitations, syncope.  Resp: dry cough, hemoptysis, dyspnea, wheezing. GI: heartburn, indigestion, abdominal pain, nausea, vomiting, diarrhea, constipation, change in bowel habits, loss of appetite, hematemesis, melena, hematochezia.  GU: dysuria, change in color of urine, urgency or frequency, flank pain, hematuria. MSK: joint pain or swelling, decreased range of motion. Psych: change in mood or affect, depression, anxiety, suicidal ideations, homicidal ideations. Skin: rash, itching, bruising.   SUBJECTIVE:  SOB and chest tightness improved.  Headache persists though better from when first arrived in ED.  Also has right leg cramps due to "charlie horse" per pt.  VITAL SIGNS: BP (!) 165/89   Pulse 87   Temp 97.5 F (36.4 C) (Oral)  Resp (!) 22   Ht 6' (1.829 m)   Wt 89.9 kg (198 lb 3.1 oz)   SpO2 96%   BMI 26.88 kg/m   HEMODYNAMICS:    VENTILATOR SETTINGS:    INTAKE / OUTPUT: I/O last 3 completed shifts: In: 364.2 [I.V.:364.2] Out: 1200 [Urine:1200]   PHYSICAL EXAMINATION: General: Young AA male, in NAD. Neuro: A&O x 3, non-focal.  HEENT: [place]/AT. PERRL, sclerae  anicteric. Cardiovascular: RRR, no M/R/G.  Lungs: Respirations even and unlabored.  CTA bilaterally, No W/R/R.  Abdomen: BS x 4, soft, NT/ND.  Musculoskeletal: No gross deformities, no edema.  Skin: Intact, warm, no rashes.  LABS:  BMET  Recent Labs Lab 09/26/16 2030 09/27/16 0301 09/27/16 0433  NA 135 135 135  K 3.3* 3.2* 3.4*  CL 97* 96* 95*  CO2 32 25 25  BUN 28* 32* 32*  CREATININE 3.18* 3.15* 3.16*  GLUCOSE 106* 142* 118*    Electrolytes  Recent Labs Lab 09/26/16 2030 09/27/16 0301 09/27/16 0433  CALCIUM 9.0 9.4 9.8  MG  --   --  2.1  PHOS  --   --  4.4    CBC  Recent Labs Lab 09/26/16 2030 09/27/16 0433  WBC 8.6 10.1  HGB 10.7* 10.7*  HCT 33.4* 33.1*  PLT 330 350    Coag's No results for input(s): APTT, INR in the last 168 hours.  Sepsis Markers No results for input(s): LATICACIDVEN, PROCALCITON, O2SATVEN in the last 168 hours.  ABG No results for input(s): PHART, PCO2ART, PO2ART in the last 168 hours.  Liver Enzymes No results for input(s): AST, ALT, ALKPHOS, BILITOT, ALBUMIN in the last 168 hours.  Cardiac Enzymes No results for input(s): TROPONINI, PROBNP in the last 168 hours.  Glucose No results for input(s): GLUCAP in the last 168 hours.  Imaging Dg Chest Portable 1 View  Result Date: 09/26/2016 CLINICAL DATA:  Dyspnea.  Cough and congestion. EXAM: PORTABLE CHEST 1 VIEW COMPARISON:  Frontal and lateral views 05/19/2016 FINDINGS: Mild cardiomegaly, likely accentuated by portable AP technique. Normal mediastinal contours. No pulmonary edema. No focal airspace opacity, large pleural effusion or pneumothorax. No acute osseous abnormality is seen. IMPRESSION: Mild cardiomegaly. No congestive failure or acute pulmonary abnormality. Electronically Signed   By: Jeb Levering M.D.   On: 09/26/2016 21:20     STUDIES:  CXR 12/25 > negative. Echo 12/26 >  CULTURES: None.  ANTIBIOTICS: None.  SIGNIFICANT EVENTS: 12/25 >  admit.  LINES/TUBES: None.  DISCUSSION: 40 y.o. male with hx med non-compliance with subsequent uncontrolled HTN, admitted 12/25 with hypertensive urgency.  Failed therapy with nitro therefore switched to cardene.  PCCM subsequently called to admit to ICU.  ASSESSMENT / PLAN:  CARDIOVASCULAR A:  Hypertensive urgency - SBP as high as 265 on ED presentation.  Stared on nitroglycerin but did not respond; therefore, switched to cardene. Troponin leak - Now resolved; suspect demand.  Hx uncontrolled HTN. P:  Continue cardene gtt, drop down to 85mcg/hour for goal SBP 185-195 (~25% reduction). Hydralazine PRN, labetalol PRN. Has not received any PO medications, will start norvasc 10 mg PO daily and lopressor 50 mg PO BID with holding parameters as ordered. Assess echo - pending. Hold preadmission benazepril, lisinopril.  RENAL A:   AKI - due to hypertensive urgency. Hypokalemia - s/p repletion in ED. P:   BMP in AM. Replace electrolytes as indicated. KVO IVF  NEUROLOGIC A:   Headache - improved but not yet resolved.  No neuro deficits on exam and  pt reports some improvement as BP lowered; therefore, low suspicion for ICH. P:   Assess UDS - THC positive. Will defer CT head, neuro status is completely normal.  PULMONARY A: Dyspnea - resolved. Tobacco dependence. P:   Tobacco cessation counseling.  GASTROINTESTINAL A:   Nutrition. P:   Heart healthy diet.  HEMATOLOGIC A:   VTE Prophylaxis. P:  SCD's / lovenox. CBC in AM.  INFECTIOUS A:   No indication of infection. P:   Monitor clinically.  ENDOCRINE A:   No acute issues.   P:   Check TSH. Check metanephrine.  Family updated: Patient updated bedside.  Interdisciplinary Family Meeting v Palliative Care Meeting:  Due by: 10/04/15.  The patient is critically ill with multiple organ systems failure and requires high complexity decision making for assessment and support, frequent evaluation and titration of  therapies, application of advanced monitoring technologies and extensive interpretation of multiple databases.   Critical Care Time devoted to patient care services described in this note is  45  Minutes. This time reflects time of care of this signee Dr Jennet Maduro. This critical care time does not reflect procedure time, or teaching time or supervisory time of PA/NP/Med student/Med Resident etc but could involve care discussion time.  Rush Farmer, M.D. [facility] Pulmonary/Critical Care Medicine. Pager: [phone]. After hours pager: [phone].  09/27/2016, 10:28 AM

## 2016-09-27 NOTE — Progress Notes (Signed)
Pt transferred to 2W31 with belongings. Report given to receiving RN and all questions answered. VSS during transfer.  Receiving RN and NT at bedside. Pt assisted to bed in new room. Family at bedside.

## 2016-09-27 NOTE — Progress Notes (Signed)
Pt has arrived to 2w from 2s. Telemetry box has been applied and CCMD notified. Pt has been oriented to room. Pt denies needs at this time. Will continue current plan of care.   Grant Fontana BSN, RN

## 2016-09-27 NOTE — Consult Note (Signed)
PULMONARY / CRITICAL CARE MEDICINE   Name: Stanley [name] MRN: [id number] DOB: 11/03/1975    ADMISSION DATE:  09/26/2016 CONSULTATION DATE:  09/27/16  REFERRING MD:  Blaine Hamper - TRH  CHIEF COMPLAINT:  Headache, SOB  HISTORY OF PRESENT ILLNESS:  Stanley [name] is a 40 y.o. male with PMH as outlined below including uncontrolled HTN due to medication non-compliance.  He states he has been out of meds for at least 2 months (maybe more) due to lack of insurance.  He had felt fine up until a few days prior to 12/25 when he began to have SOB, chest tightness, dry cough, headache.  Symptoms persisted and worsened and on 12/25, he decided to come to ED for further evaluation.  He was found to have BP of 265/168.  Initially admitted by [facility] and started on nitroglycerin gtt as well as PRN Hydralazine and Labetalol.  After several hours and high doses of nitro, BP still in 220-230 range; therefore, he was switched over to cardene.  Within 30 minutes at dose of 37mcg/hr, SBP had dropped into 160-165 range. Goal SBP is 185-195 based on ~25% reduction.  Cardene dropped down to 61mcg/hr and pt admitted to ICU.  PAST MEDICAL HISTORY :  He  has a past medical history of Hypertension and Tobacco abuse.  PAST SURGICAL HISTORY: He  has no past surgical history on file.  Allergies  Allergen Reactions  . Lactose Intolerance (Gi) Nausea And Vomiting  . Other Swelling    All beans    No current facility-administered medications on file prior to encounter.    Current Outpatient Prescriptions on File Prior to Encounter  Medication Sig  . amLODipine (NORVASC) 5 MG tablet Take 1 tablet (5 mg total) by mouth daily. (Patient not taking: Reported on 09/26/2016)  . benazepril (LOTENSIN) 40 MG tablet Take 1 tablet (40 mg total) by mouth daily. (Patient not taking: Reported on 09/26/2016)  . lisinopril (PRINIVIL,ZESTRIL) 20 MG tablet Take 1 tablet (20 mg total) by mouth daily. (Patient not taking: Reported on 09/26/2016)     FAMILY HISTORY:  His indicated that the status of his mother is unknown. He indicated that the status of his brother is unknown.    SOCIAL HISTORY: He  reports that he has been smoking.  He has never used smokeless tobacco. He reports that he drinks alcohol. He reports that he does not use drugs.  REVIEW OF SYSTEMS:   All negative; except for those that are bolded, which indicate positives.  Constitutional: weight loss, weight gain, night sweats, fevers, chills, fatigue, weakness.  HEENT: headaches, sore throat, sneezing, nasal congestion, post nasal drip, difficulty swallowing, tooth/dental problems, visual complaints, visual changes, ear aches. Neuro: difficulty with speech, weakness, numbness, ataxia, headache. CV:  chest tightness, orthopnea, PND, swelling in lower extremities, dizziness, palpitations, syncope.  Resp: dry cough, hemoptysis, dyspnea, wheezing. GI: heartburn, indigestion, abdominal pain, nausea, vomiting, diarrhea, constipation, change in bowel habits, loss of appetite, hematemesis, melena, hematochezia.  GU: dysuria, change in color of urine, urgency or frequency, flank pain, hematuria. MSK: joint pain or swelling, decreased range of motion. Psych: change in mood or affect, depression, anxiety, suicidal ideations, homicidal ideations. Skin: rash, itching, bruising.   SUBJECTIVE:  SOB and chest tightness improved.  Headache persists though better from when first arrived in ED.  Also has right leg cramps due to "charlie horse" per pt.  VITAL SIGNS: BP 165/99   Pulse 90   Temp 98.5 F (36.9 C) (Oral)  Resp (!) 27   SpO2 97%   HEMODYNAMICS:    VENTILATOR SETTINGS:    INTAKE / OUTPUT: No intake/output data recorded.   PHYSICAL EXAMINATION: General: Young AA male, in NAD. Neuro: A&O x 3, non-focal.  HEENT: [place]/AT. PERRL, sclerae anicteric. Cardiovascular: RRR, no M/R/G.  Lungs: Respirations even and unlabored.  CTA bilaterally, No W/R/R.  Abdomen:  BS x 4, soft, NT/ND.  Musculoskeletal: No gross deformities, no edema.  Skin: Intact, warm, no rashes.  LABS:  BMET  Recent Labs Lab 09/26/16 2030 09/27/16 0301  NA 135 135  K 3.3* 3.2*  CL 97* 96*  CO2 32 25  BUN 28* 32*  CREATININE 3.18* 3.15*  GLUCOSE 106* 142*    Electrolytes  Recent Labs Lab 09/26/16 2030 09/27/16 0301  CALCIUM 9.0 9.4    CBC  Recent Labs Lab 09/26/16 2030  WBC 8.6  HGB 10.7*  HCT 33.4*  PLT 330    Coag's No results for input(s): APTT, INR in the last 168 hours.  Sepsis Markers No results for input(s): LATICACIDVEN, PROCALCITON, O2SATVEN in the last 168 hours.  ABG No results for input(s): PHART, PCO2ART, PO2ART in the last 168 hours.  Liver Enzymes No results for input(s): AST, ALT, ALKPHOS, BILITOT, ALBUMIN in the last 168 hours.  Cardiac Enzymes No results for input(s): TROPONINI, PROBNP in the last 168 hours.  Glucose No results for input(s): GLUCAP in the last 168 hours.  Imaging Dg Chest Portable 1 View  Result Date: 09/26/2016 CLINICAL DATA:  Dyspnea.  Cough and congestion. EXAM: PORTABLE CHEST 1 VIEW COMPARISON:  Frontal and lateral views 05/19/2016 FINDINGS: Mild cardiomegaly, likely accentuated by portable AP technique. Normal mediastinal contours. No pulmonary edema. No focal airspace opacity, large pleural effusion or pneumothorax. No acute osseous abnormality is seen. IMPRESSION: Mild cardiomegaly. No congestive failure or acute pulmonary abnormality. Electronically Signed   By: Jeb Levering M.D.   On: 09/26/2016 21:20     STUDIES:  CXR 12/25 > negative. Echo 12/26 >  CULTURES: None.  ANTIBIOTICS: None.  SIGNIFICANT EVENTS: 12/25 > admit.  LINES/TUBES: None.  DISCUSSION: 40 y.o. male with hx med non-compliance with subsequent uncontrolled HTN, admitted 12/25 with hypertensive urgency.  Failed therapy with nitro therefore switched to cardene.  PCCM subsequently called to admit to  ICU.  ASSESSMENT / PLAN:  CARDIOVASCULAR A:  Hypertensive urgency - SBP as high as 265 on ED presentation.  Stared on nitroglycerin but did not respond; therefore, switched to cardene. Troponin leak - Now resolved; suspect demand.  Hx uncontrolled HTN. P:  Continue cardene gtt, drop down to 11mcg/hour for goal SBP 185-195 (~25% reduction). Clonidine added, metoprolol added. Hydralazine PRN, labetalol PRN. Continue preadmission amlodipine. Assess echo. Hold preadmission benazepril, lisinopril.  RENAL A:   AKI - due to hypertensive urgency. Hypokalemia - s/p repletion in ED. P:   BMP in AM.  NEUROLOGIC A:   Headache - improved but not yet resolved.  No neuro deficits on exam and pt reports some improvement as BP lowered; therefore, low suspicion for ICH. P:   Assess UDS. Will defer CT head for now.  PULMONARY A: Dyspnea - resolved. Tobacco dependence. P:   Tobacco cessation counseling.   GASTROINTESTINAL A:   Nutrition. P:   Heart healthy diet.  HEMATOLOGIC A:   VTE Prophylaxis. P:  SCD's / lovenox. CBC in AM.  INFECTIOUS A:   No indication of infection. P:   Monitor clinically.  ENDOCRINE A:   No acute issues.  P:   No interventions required.   Family updated: None available.  Interdisciplinary Family Meeting v Palliative Care Meeting:  Due by: 10/04/15.  CC time: 30 minutes.   Montey Hora, [place] - [facility] Pager: [phone]  or [phone] 09/27/2016, 3:56 AM

## 2016-09-27 NOTE — ED Notes (Signed)
Critical care at bedside  

## 2016-09-27 NOTE — Care Management Note (Signed)
Case Management Note  Patient Details  Name: Stanley [name] MRN: [id number] Date of Birth: Dec 29, 1975  Subjective/Objective:  Pt admitted on 09/26/16 with hypertensive urgency.  PTA, pt independent of ADLS.  He is uninsured, and has been able to get BP meds filled, per report.                   Action/Plan: Pt is eligible for medication assistance at dc through [facility] program.  He will need follow up at [facility] and [facility] for PCP and hypertension management.  Case manager will need to arrange appointment when dc date known.  Clinic can provide assistance with meds and enroll for [facility] Card to help with MD f/u costs.  RN case mgr to follow.    Expected Discharge Date:                  Expected Discharge Plan:  Home/Self Care  In-House Referral:     Discharge planning Services  CM Consult, [facility], [facility] Program  Post Acute Care Choice:    Choice offered to:     DME Arranged:    DME Agency:     HH Arranged:    HH Agency:     Status of Service:  In process, will continue to follow  If discussed at Long Length of Stay Meetings, dates discussed:    Additional Comments:  Ella Bodo, RN 09/27/2016, 4:18 PM

## 2016-09-27 NOTE — ED Notes (Signed)
Report attempted 

## 2016-09-27 NOTE — ED Notes (Signed)
Ordered hospital bed @ [id number]

## 2016-09-28 ENCOUNTER — Inpatient Hospital Stay (HOSPITAL_COMMUNITY): Payer: Self-pay

## 2016-09-28 DIAGNOSIS — I509 Heart failure, unspecified: Secondary | ICD-10-CM

## 2016-09-28 LAB — CBC
HEMATOCRIT: 31.1 % — AB (ref 39.0–52.0)
Hemoglobin: 10 g/dL — ABNORMAL LOW (ref 13.0–17.0)
MCH: 25.4 pg — ABNORMAL LOW (ref 26.0–34.0)
MCHC: 32.2 g/dL (ref 30.0–36.0)
MCV: 79.1 fL (ref 78.0–100.0)
Platelets: 337 10*3/uL (ref 150–400)
RBC: 3.93 MIL/uL — AB (ref 4.22–5.81)
RDW: 16 % — ABNORMAL HIGH (ref 11.5–15.5)
WBC: 7.7 10*3/uL (ref 4.0–10.5)

## 2016-09-28 LAB — BASIC METABOLIC PANEL
ANION GAP: 12 (ref 5–15)
BUN: 30 mg/dL — ABNORMAL HIGH (ref 6–20)
CO2: 27 mmol/L (ref 22–32)
Calcium: 9.1 mg/dL (ref 8.9–10.3)
Chloride: 96 mmol/L — ABNORMAL LOW (ref 101–111)
Creatinine, Ser: 2.99 mg/dL — ABNORMAL HIGH (ref 0.61–1.24)
GFR calc Af Amer: 29 mL/min — ABNORMAL LOW (ref >60)
GFR, EST NON AFRICAN AMERICAN: 25 mL/min — AB (ref >60)
GLUCOSE: 97 mg/dL (ref 65–99)
POTASSIUM: 3.8 mmol/L (ref 3.5–5.1)
Sodium: 135 mmol/L (ref 135–145)

## 2016-09-28 LAB — UREA NITROGEN, URINE: Urea Nitrogen, Ur: 357 mg/dL

## 2016-09-28 LAB — MAGNESIUM: Magnesium: 2.1 mg/dL (ref 1.7–2.4)

## 2016-09-28 LAB — PHOSPHORUS: Phosphorus: 4.3 mg/dL (ref 2.5–4.6)

## 2016-09-28 LAB — ECHOCARDIOGRAM COMPLETE
Height: 72 in
WEIGHTICAEL: 3203.2 [oz_av]

## 2016-09-28 MED ORDER — METOPROLOL TARTRATE 100 MG PO TABS
100.0000 mg | ORAL_TABLET | Freq: Two times a day (BID) | ORAL | Status: DC
  Administered 2016-09-28 – 2016-09-29 (×2): 100 mg via ORAL
  Filled 2016-09-28 (×2): qty 1

## 2016-09-28 MED ORDER — SODIUM CHLORIDE 0.9 % IV SOLN
INTRAVENOUS | Status: DC
  Administered 2016-09-28 – 2016-09-29 (×3): via INTRAVENOUS

## 2016-09-28 NOTE — Progress Notes (Signed)
Patient lying  In bed. No needs at this time. Family at bedside. Call light within reach.

## 2016-09-28 NOTE — Progress Notes (Signed)
PROGRESS NOTE    Stanley [name]  EHU:[id number] DOB: [birth year] DOA: 09/26/2016 PCP: No PCP Per Patient    Brief Narrative:  40 y/o ? Prior history of uncontrolled hypertension  tobacco abuse Headaches Body mass index is 27.15 kg/m.  Admitted 09/26/2016 with severe hypertension to 60 systolic range, BNP 7858 troponin 0.09 and acute kidney injury with creatinine 3.18  Glucose outpatient and managed him on Cardene drip and subsequently patient's blood pressure is Better Echo is pending  Assessment & Plan:   Principal Problem:   Hypertensive emergency Active Problems:   Acute CHF ([place])   Tobacco abuse   Elevated troponin   Hypertension   Hypokalemia   AKI (acute kidney injury) ([place])   Hypertensive urgency   Acute congestive heart failure ([place])   Acute dyspnea   Hypertensive emergency with some evidence of end organ damage in terms of nephropathy -Continue amlodipine 10, clonidine 0.2 every 8, metoprolol 50 twice a day He has when necessary's for hydralazine 10-40 mg every 4 when necessary nausea 850-277 systolic Have increased 41/28 metoprolol to 100 mg twice a day 78/67  Chronic diastolic heart failure grade 2 Culture shows concentric severe hypertrophy   Does not appear volume overloaded. 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 Start saline cautiously 7 5 cc per hour 09/28/16 Monitor the creatinine  Hyperlipidemia Continue atorvastatin 40 daily   DVT prophylaxis: Lovenox Code Status: Full Family Communication: none present Disposition Plan: inpatient pending resolution in 3-4 days   Consultants:   none  Procedures:   none  Antimicrobials:   none    Subjective:  Patient alert present oriented in no distress Ambulatory with no headaches or chest pain at present time Denies Shortness of breath nor wheeze no dysuria or difficulty passing urine  Objective: Vitals:   09/28/16 0446  09/28/16 0523 09/28/16 0609 09/28/16 1115  BP: (!) 179/100 (!) 172/100 (!) 173/106 (!) 177/117  Pulse: 75 75 75   Resp:  16    Temp:      TempSrc:      SpO2:      Weight:      Height:        Intake/Output Summary (Last 24 hours) at 09/28/16 1158 Last data filed at 09/28/16 0830  Gross per 24 hour  Intake           279.58 ml  Output                0 ml  Net           279.58 ml   Filed Weights   09/27/16 0500 09/28/16 0353  Weight: 89.9 kg (198 lb 3.1 oz) 90.8 kg (200 lb 3.2 oz)    Examination:  General exam: Appears calm and comfortable  Respiratory system: Clear to auscultation. Respiratory effort normal. Cardiovascular system: S1 & S2 heard, RRR. No JVD, murmurs, rubs, gallops or clicks. No pedal edema. Gastrointestinal system: Abdomen is nondistended, soft and nontender. No organomegaly or masses felt. Normal bowel sounds heard. Central nervous system: Alert and oriented. No focal neurological deficits. Extremities: Symmetric 5 x 5 power. Skin: No rashes, lesions or ulcers Psychiatry: Judgement and insight appear normal. Mood & affect appropriate.     Data Reviewed: I have personally reviewed following labs and imaging studies  CBC:  Recent Labs Lab 09/26/16 2030 09/27/16 0433 09/28/16 0313  WBC 8.6 10.1 7.7  HGB 10.7* 10.7* 10.0*  HCT 33.4* 33.1* 31.1*  MCV 78.4 78.3 79.1  PLT 330 350 527   Basic Metabolic Panel:  Recent Labs Lab 09/26/16 2030 09/27/16 0301 09/27/16 0433 09/28/16 0313  NA 135 135 135 135  K 3.3* 3.2* 3.4* 3.8  CL 97* 96* 95* 96*  CO2 32 25 25 27   GLUCOSE 106* 142* 118* 97  BUN 28* 32* 32* 30*  CREATININE 3.18* 3.15* 3.16* 2.99*  CALCIUM 9.0 9.4 9.8 9.1  MG  --   --  2.1 2.1  PHOS  --   --  4.4 4.3   GFR: Estimated Creatinine Clearance: 36.4 mL/min (by C-G formula based on SCr of 2.99 mg/dL (H)). Liver Function Tests: No results for input(s): AST, ALT, ALKPHOS, BILITOT, PROT, ALBUMIN in the last 168 hours. No results for  input(s): LIPASE, AMYLASE in the last 168 hours. No results for input(s): AMMONIA in the last 168 hours. Coagulation Profile: No results for input(s): INR, PROTIME in the last 168 hours. Cardiac Enzymes: No results for input(s): CKTOTAL, CKMB, CKMBINDEX, TROPONINI in the last 168 hours. BNP (last 3 results) No results for input(s): PROBNP in the last 8760 hours. HbA1C:  Recent Labs  09/27/16 0301  HGBA1C 4.8   CBG: No results for input(s): GLUCAP in the last 168 hours. Lipid Profile:  Recent Labs  09/27/16 0301  CHOL 132  HDL 31*  LDLCALC 87  TRIG 68  CHOLHDL 4.3   Thyroid Function Tests:  Recent Labs  09/27/16 1117  TSH 1.233  FREET4 1.38*   Anemia Panel: No results for input(s): VITAMINB12, FOLATE, FERRITIN, TIBC, IRON, RETICCTPCT in the last 72 hours. Sepsis Labs: No results for input(s): PROCALCITON, LATICACIDVEN in the last 168 hours.  Recent Results (from the past 240 hour(s))  MRSA PCR Screening     Status: None   Collection Time: 09/27/16  5:26 AM  Result Value Ref Range Status   MRSA by PCR NEGATIVE NEGATIVE Final    Comment:        The GeneXpert MRSA Assay (FDA approved for NASAL specimens only), is one component of a comprehensive MRSA colonization surveillance program. It is not intended to diagnose MRSA infection nor to guide or monitor treatment for MRSA infections.          Radiology Studies: Dg Chest Portable 1 View  Result Date: 09/26/2016 CLINICAL DATA:  Dyspnea.  Cough and congestion. EXAM: PORTABLE CHEST 1 VIEW COMPARISON:  Frontal and lateral views 05/19/2016 FINDINGS: Mild cardiomegaly, likely accentuated by portable AP technique. Normal mediastinal contours. No pulmonary edema. No focal airspace opacity, large pleural effusion or pneumothorax. No acute osseous abnormality is seen. IMPRESSION: Mild cardiomegaly. No congestive failure or acute pulmonary abnormality. Electronically Signed   By: Jeb Levering M.D.   On:  09/26/2016 21:20        Scheduled Meds: . amLODipine  10 mg Oral Daily  . aspirin EC  325 mg Oral Daily  . atorvastatin  40 mg Oral q1800  . cloNIDine  0.2 mg Oral Q8H  . enoxaparin (LOVENOX) injection  40 mg Subcutaneous Q24H  . metoprolol tartrate  50 mg Oral BID  . multivitamin with minerals  1 tablet Oral Daily  . nicotine  21 mg Transdermal Q24H   Continuous Infusions: . sodium chloride 75 mL/hr at 09/28/16 1120     LOS: 2 days    Time spent: [place], [place], [place] [facility] Pager [phone]  If 7PM-7AM, please contact night-coverage [email or web address] Password [facility] 09/28/2016, 11:58 AM

## 2016-09-28 NOTE — Progress Notes (Signed)
  Echocardiogram 2D Echocardiogram has been performed.  Jennette Dubin 09/28/2016, 9:15 AM

## 2016-09-28 NOTE — Progress Notes (Signed)
PT Cancellation Note/Screen  Patient Details Name: Stanley [name] MRN: [id number] DOB: April 22, 1976   Cancelled Treatment:    Reason Eval/Treat Not Completed: PT screened, no needs identified, will sign off.  Spoke with RN and pt, no PT needs identified.  Pt is young and mobile and asymptomatic with current mobility around room.  I encouraged him to walk TID in the hallway while here in the hospital and he agreed to do so.  PT to sign off.   Thanks,    Barbarann Ehlers. Cristofher Livecchi, PT, DPT [phone]   09/28/2016, 3:41 PM

## 2016-09-29 LAB — CBC
HEMATOCRIT: 31.4 % — AB (ref 39.0–52.0)
HEMOGLOBIN: 9.8 g/dL — AB (ref 13.0–17.0)
MCH: 25.5 pg — ABNORMAL LOW (ref 26.0–34.0)
MCHC: 31.2 g/dL (ref 30.0–36.0)
MCV: 81.8 fL (ref 78.0–100.0)
Platelets: 302 10*3/uL (ref 150–400)
RBC: 3.84 MIL/uL — AB (ref 4.22–5.81)
RDW: 16.7 % — ABNORMAL HIGH (ref 11.5–15.5)
WBC: 6.6 10*3/uL (ref 4.0–10.5)

## 2016-09-29 LAB — COMPREHENSIVE METABOLIC PANEL
ALBUMIN: 3.1 g/dL — AB (ref 3.5–5.0)
ALK PHOS: 70 U/L (ref 38–126)
ALT: 11 U/L — ABNORMAL LOW (ref 17–63)
ANION GAP: 13 (ref 5–15)
AST: 14 U/L — ABNORMAL LOW (ref 15–41)
BILIRUBIN TOTAL: 0.4 mg/dL (ref 0.3–1.2)
BUN: 39 mg/dL — ABNORMAL HIGH (ref 6–20)
CALCIUM: 8.8 mg/dL — AB (ref 8.9–10.3)
CO2: 24 mmol/L (ref 22–32)
Chloride: 97 mmol/L — ABNORMAL LOW (ref 101–111)
Creatinine, Ser: 3.55 mg/dL — ABNORMAL HIGH (ref 0.61–1.24)
GFR calc non Af Amer: 20 mL/min — ABNORMAL LOW (ref >60)
GFR, EST AFRICAN AMERICAN: 23 mL/min — AB (ref >60)
GLUCOSE: 86 mg/dL (ref 65–99)
POTASSIUM: 4.2 mmol/L (ref 3.5–5.1)
Sodium: 134 mmol/L — ABNORMAL LOW (ref 135–145)
TOTAL PROTEIN: 6.9 g/dL (ref 6.5–8.1)

## 2016-09-29 MED ORDER — METOPROLOL TARTRATE 50 MG PO TABS
150.0000 mg | ORAL_TABLET | Freq: Two times a day (BID) | ORAL | Status: DC
  Administered 2016-09-29 – 2016-10-01 (×4): 150 mg via ORAL
  Filled 2016-09-29 (×4): qty 1

## 2016-09-29 MED ORDER — METOPROLOL TARTRATE 50 MG PO TABS
50.0000 mg | ORAL_TABLET | Freq: Once | ORAL | Status: AC
  Administered 2016-09-29: 50 mg via ORAL
  Filled 2016-09-29: qty 1

## 2016-09-29 MED ORDER — METOPROLOL TARTRATE 50 MG PO TABS: 50.0000 mg | ORAL_TABLET | Freq: Once | ORAL | Status: DC

## 2016-09-29 NOTE — Progress Notes (Signed)
PROGRESS NOTE    Stanley [name]  ZOX:[id number] DOB: 1976-06-06 DOA: 09/26/2016 PCP: No PCP Per Patient    Brief Narrative:  40 y/o ? Prior history of uncontrolled hypertension  tobacco abuse Headaches Body mass index is 27.48 kg/m.  Admitted 09/26/2016 with severe hypertension to 60 systolic range, BNP 8119 troponin 0.09 and acute kidney injury with creatinine 3.18  Glucose outpatient and managed him on Cardene drip and subsequently patient's blood pressure is Better Echo is pending  Assessment & Plan:   Principal Problem:   Hypertensive emergency Active Problems:   Acute CHF ([place])   Tobacco abuse   Elevated troponin   Hypertension   Hypokalemia   AKI (acute kidney injury) ([place])   Hypertensive urgency   Acute congestive heart failure ([facility])   Acute dyspnea   Hypertensive emergency with some evidence of end organ damage in terms of nephropathy -Continue amlodipine 10, clonidine 0.2 every 8, metoprolol 50 twice a day--have increased the metoprolol to 150 mg 2 times a day as blood pressure still not controlled and cannot give ARB or ACE inhibitor He has when necessary's for hydralazine 10-40 mg every 4 when necessary nausea 147-829 systolic  Chronic diastolic heart failure grade 2 Echo does show concentric severe hypertrophy   Does not appear volume overloaded. 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   Hyperlipidemia Continue atorvastatin 40 daily   DVT prophylaxis: Lovenox Code Status: Full Family Communication: none present Disposition Plan: inpatient pending resolution in 3-4  days   Consultants:   none  Procedures:   none  Antimicrobials:   none    Subjective:  Doing fair only Tolerating diet Eating and drink Ambulated in the halls Some headache  Objective: Vitals:   09/29/16 0453 09/29/16 0547 09/29/16 1006 09/29/16 1321  BP: (!) 197/127 (!) 177/109 (!) 172/102 (!) 151/94  Pulse: 70 75 75 66  Resp: 18 18 18 18   Temp: 97.3 F (36.3 C)  98.5 F (36.9 C)   TempSrc: Axillary  Oral   SpO2: 100% 100% 100% 99%  Weight: 91.9 kg (202 lb 9.6 oz)     Height:        Intake/Output Summary (Last 24 hours) at 09/29/16 1353 Last data filed at 09/29/16 0800  Gross per 24 hour  Intake          1271.25 ml  Output                0 ml  Net          1271.25 ml   Filed Weights   09/27/16 0500 09/28/16 0353 09/29/16 0453  Weight: 89.9 kg (198 lb 3.1 oz) 90.8 kg (200 lb 3.2 oz) 91.9 kg (202 lb 9.6 oz)    Examination:  General exam: Appears calm and comfortable  Respiratory system: Clear to auscultation. Respiratory effort normal. Cardiovascular system: S1 & S2 heard, RRR. No JVD. No pedal edema. Gastrointestinal system: Abdomen is nondistended, soft and nontender. No organomegaly or masses felt. Normal bowel sounds heard. Central nervous system: Alert and oriented. No focal neurological deficits.  Extremities: Symmetric 5 x 5 power. Skin: No rashes, lesions or ulcers Psychiatry:  Judgement and insight appear normal. Mood & affect appropriate.     Data Reviewed: I have personally reviewed following labs and imaging studies  CBC:  Recent Labs Lab 09/26/16 2030 09/27/16 0433 09/28/16 0313 09/29/16 0229  WBC 8.6 10.1 7.7 6.6  HGB 10.7* 10.7* 10.0* 9.8*  HCT 33.4* 33.1* 31.1* 31.4*  MCV 78.4 78.3 79.1 81.8  PLT 330 350 337 341   Basic Metabolic Panel:  Recent Labs Lab 09/26/16 2030 09/27/16 0301 09/27/16 0433 09/28/16 0313 09/29/16 0229  NA 135 135 135 135 134*  K 3.3* 3.2* 3.4* 3.8 4.2  CL 97* 96* 95* 96* 97*  CO2 32 25 25 27  24   GLUCOSE 106* 142* 118* 97 86  BUN 28* 32* 32* 30* 39*  CREATININE 3.18* 3.15* 3.16* 2.99* 3.55*  CALCIUM 9.0 9.4 9.8 9.1 8.8*  MG  --   --  2.1 2.1  --   PHOS  --   --  4.4 4.3  --    GFR: Estimated Creatinine Clearance: 30.7 mL/min (by C-G formula based on SCr of 3.55 mg/dL (H)). Liver Function Tests:  Recent Labs Lab 09/29/16 0229  AST 14*  ALT 11*  ALKPHOS 70  BILITOT 0.4  PROT 6.9  ALBUMIN 3.1*   No results for input(s): LIPASE, AMYLASE in the last 168 hours. No results for input(s): AMMONIA in the last 168 hours. Coagulation Profile: No results for input(s): INR, PROTIME in the last 168 hours. Cardiac Enzymes: No results for input(s): CKTOTAL, CKMB, CKMBINDEX, TROPONINI in the last 168 hours. BNP (last 3 results) No results for input(s): PROBNP in the last 8760 hours. HbA1C:  Recent Labs  09/27/16 0301  HGBA1C 4.8   CBG: No results for input(s): GLUCAP in the last 168 hours. Lipid Profile:  Recent Labs  09/27/16 0301  CHOL 132  HDL 31*  LDLCALC 87  TRIG 68  CHOLHDL 4.3   Thyroid Function Tests:  Recent Labs  09/27/16 1117  TSH 1.233  FREET4 1.38*   Anemia Panel: No results for input(s): VITAMINB12, FOLATE, FERRITIN, TIBC, IRON, RETICCTPCT in the last 72 hours. Sepsis Labs: No results for input(s): PROCALCITON, LATICACIDVEN in the last 168 hours.  Recent Results (from the past 240 hour(s))  MRSA PCR Screening     Status: None   Collection Time: 09/27/16  5:26 AM  Result Value Ref Range Status   MRSA by PCR NEGATIVE NEGATIVE Final    Comment:        The GeneXpert MRSA Assay (FDA approved for NASAL specimens only), is one component of a comprehensive MRSA colonization surveillance program. It is not intended to diagnose MRSA infection nor to guide or monitor treatment for MRSA infections.          Radiology Studies: No results found.      Scheduled Meds: . amLODipine  10 mg Oral Daily  . aspirin EC  325 mg Oral Daily    . atorvastatin  40 mg Oral q1800  . cloNIDine  0.2 mg Oral Q8H  . enoxaparin (LOVENOX) injection  40 mg Subcutaneous Q24H  . metoprolol tartrate  150 mg Oral BID  . multivitamin with minerals  1 tablet Oral Daily  . nicotine  21 mg Transdermal Q24H   Continuous Infusions: . sodium chloride 75 mL/hr at 09/29/16 1302     LOS: 3 days    Time spent: [place], [place], MD [facility] Pager [phone]  If 7PM-7AM, please contact night-coverage [email or web address] Password [facility] 09/29/2016,  1:53 PM

## 2016-09-29 NOTE — Progress Notes (Signed)
Patient lying in bed, no needs at this time. Call light within reach. 

## 2016-09-30 ENCOUNTER — Inpatient Hospital Stay (HOSPITAL_COMMUNITY): Payer: Self-pay

## 2016-09-30 LAB — BASIC METABOLIC PANEL
Anion gap: 8 (ref 5–15)
BUN: 45 mg/dL — AB (ref 6–20)
CO2: 27 mmol/L (ref 22–32)
CREATININE: 3.82 mg/dL — AB (ref 0.61–1.24)
Calcium: 8.7 mg/dL — ABNORMAL LOW (ref 8.9–10.3)
Chloride: 102 mmol/L (ref 101–111)
GFR calc Af Amer: 21 mL/min — ABNORMAL LOW (ref >60)
GFR, EST NON AFRICAN AMERICAN: 18 mL/min — AB (ref >60)
Glucose, Bld: 83 mg/dL (ref 65–99)
Potassium: 3.9 mmol/L (ref 3.5–5.1)
SODIUM: 137 mmol/L (ref 135–145)

## 2016-09-30 LAB — METANEPHRINES, PLASMA
METANEPHRINE FREE: 51 pg/mL (ref 0–62)
Normetanephrine, Free: 529 pg/mL — ABNORMAL HIGH (ref 0–145)

## 2016-09-30 LAB — SODIUM, URINE, RANDOM: SODIUM UR: 23 mmol/L

## 2016-09-30 LAB — CREATININE, URINE, RANDOM: Creatinine, Urine: 93.95 mg/dL

## 2016-09-30 MED ORDER — ENOXAPARIN SODIUM 30 MG/0.3ML ~~LOC~~ SOLN
30.0000 mg | SUBCUTANEOUS | Status: DC
  Administered 2016-10-01 – 2016-10-03 (×3): 30 mg via SUBCUTANEOUS
  Filled 2016-09-30 (×3): qty 0.3

## 2016-09-30 NOTE — Progress Notes (Signed)
PROGRESS NOTE    Stanley [name]  LNL:[id number] DOB: 06-10-1976 DOA: 09/26/2016 PCP: No PCP Per Patient    Brief Narrative:  40 y/o ? Prior history of uncontrolled hypertension  tobacco abuse Headaches Body mass index is 27.6 kg/m.  Admitted 09/26/2016 with severe hypertension to 60 systolic range, BNP 4081 troponin 0.09 and acute kidney injury with creatinine 3.18  Glucose outpatient and managed him on Cardene drip and subsequently patient's blood pressure is Better Echo 12/27 concentric hypertrophy Creatinine steadily has risen from low 2 range to 3.8 Renal ultrasound pending   Assessment & Plan:   Principal Problem:   Hypertensive emergency Active Problems:   Acute CHF ([place])   Tobacco abuse   Elevated troponin   Hypertension   Hypokalemia   AKI (acute kidney injury) ([place])   Hypertensive urgency   Acute congestive heart failure (H[facility])   Acute dyspnea   Hypertensive emergency with some evidence of end organ damage in terms of nephropathy -Continue amlodipine 10, clonidine 0.2 every 8, metoprolol 50 twice a day--have increased the metoprolol to 150 mg 2 times a day as blood pressure still not controlled and cannot give ARB or ACE inhibitor He has when necessary's for hydralazine 10-40 mg every 4 when necessary nausea 448-185 systolic  Chronic diastolic heart failure grade 2 Echo does show concentric severe hypertrophy   Does not appear volume overloaded. 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  Hyperlipidemia Continue atorvastatin 40 daily   DVT prophylaxis: Lovenox Code Status: Full Family Communication: none present Disposition Plan: Not ready for discharge, awaiting further improvement in renal function   Consultants:   none  Procedures:   none  Antimicrobials:   none    Subjective:  Eating drinking no acute complaints tolerating diet No other issue   Objective: Vitals:   09/29/16 2009 09/30/16 0501 09/30/16 0837 09/30/16 1422  BP: (!) 170/105 (!) 176/109 (!) 174/98 (!) 152/90  Pulse: 64 65 65 66  Resp: 18 18 18 19   Temp: 98.6 F (37 C) 98.6 F (37 C) 98.4 F (36.9 C) 98.1 F (36.7 C)  TempSrc: Oral Oral Oral Oral  SpO2: 100% 100% 100% 100%  Weight:  92.3 kg (203 lb 7.8 oz)    Height:        Intake/Output Summary (Last 24 hours) at 09/30/16 1741 Last data filed at 09/30/16 1423  Gross per 24 hour  Intake              600 ml  Output              725 ml  Net             -125 ml   Filed Weights   09/28/16 0353 09/29/16 0453 09/30/16 0501  Weight: 90.8 kg (200 lb 3.2 oz) 91.9 kg (202 lb 9.6 oz) 92.3 kg (203 lb 7.8 oz)    Examination:  General exam: Appears calm and comfortable  Respiratory system: Clear to  auscultation. Respiratory effort normal. Cardiovascular system: S1 & S2 heard, RRR. No JVD. No pedal edema. Gastrointestinal system: Abdomen is nondistended, soft and nontender. No organomegaly or masses felt. Normal bowel sounds heard. Central nervous system: Alert and oriented. No focal neurological deficits.  Extremities: Symmetric 5 x 5 power. Skin: No rashes, lesions or ulcers Psychiatry: Judgement and insight appear normal. Mood & affect appropriate.     Data Reviewed: I have personally reviewed following labs and imaging studies  CBC:  Recent Labs Lab 09/26/16 2030 09/27/16 0433 09/28/16 0313 09/29/16 0229  WBC 8.6 10.1 7.7  6.6  HGB 10.7* 10.7* 10.0* 9.8*  HCT 33.4* 33.1* 31.1* 31.4*  MCV 78.4 78.3 79.1 81.8  PLT 330 350 337 034   Basic Metabolic Panel:  Recent Labs Lab 09/27/16 0301 09/27/16 0433 09/28/16 0313 09/29/16 0229 09/30/16 0341  NA 135 135 135 134* 137  K 3.2* 3.4* 3.8 4.2 3.9  CL 96* 95* 96* 97* 102  CO2 25 25 27 24 27   GLUCOSE 142* 118* 97 86 83  BUN 32* 32* 30* 39* 45*  CREATININE 3.15* 3.16* 2.99* 3.55* 3.82*  CALCIUM 9.4 9.8 9.1 8.8* 8.7*  MG  --  2.1 2.1  --   --   PHOS  --  4.4 4.3  --   --    GFR: Estimated Creatinine Clearance: 28.5 mL/min (by C-G formula based on SCr of 3.82 mg/dL (H)). Liver Function Tests:  Recent Labs Lab 09/29/16 0229  AST 14*  ALT 11*  ALKPHOS 70  BILITOT 0.4  PROT 6.9  ALBUMIN 3.1*   No results for input(s): LIPASE, AMYLASE in the last 168 hours. No results for input(s): AMMONIA in the last 168 hours. Coagulation Profile: No results for input(s): INR, PROTIME in the last 168 hours. Cardiac Enzymes: No results for input(s): CKTOTAL, CKMB, CKMBINDEX, TROPONINI in the last 168 hours. BNP (last 3 results) No results for input(s): PROBNP in the last 8760 hours. HbA1C: No results for input(s): HGBA1C in the last 72 hours. CBG: No results for input(s): GLUCAP in the last 168 hours. Lipid Profile: No results for input(s): CHOL, HDL, LDLCALC, TRIG, CHOLHDL, LDLDIRECT in the last 72 hours. Thyroid Function Tests: No results for input(s): TSH, T4TOTAL, FREET4, T3FREE, THYROIDAB in the last 72 hours. Anemia Panel: No results for input(s): VITAMINB12, FOLATE, FERRITIN, TIBC, IRON, RETICCTPCT in the last 72 hours. Sepsis Labs: No results for input(s): PROCALCITON, LATICACIDVEN in the last 168 hours.  Recent Results (from the past 240 hour(s))  MRSA PCR Screening     Status: None   Collection Time: 09/27/16  5:26 AM  Result Value Ref Range Status   MRSA by PCR NEGATIVE NEGATIVE Final    Comment:        The GeneXpert MRSA Assay (FDA approved  for NASAL specimens only), is one component of a comprehensive MRSA colonization surveillance program. It is not intended to diagnose MRSA infection nor to guide or monitor treatment for MRSA infections.          Radiology Studies: No results found.      Scheduled Meds: . amLODipine  10 mg Oral Daily  . aspirin EC  325 mg Oral Daily  . atorvastatin  40 mg Oral q1800  . cloNIDine  0.2 mg Oral Q8H  . [START ON 10/01/2016] enoxaparin (LOVENOX) injection  30 mg Subcutaneous Q24H  . metoprolol tartrate  150 mg Oral BID  . multivitamin with minerals  1 tablet Oral Daily  . nicotine  21 mg Transdermal Q24H   Continuous Infusions:    LOS: 4 days    Time spent: 49    Nita Sells, MD [facility] Pager [phone]  If 7PM-7AM, please contact night-coverage [email or web address] Password [facility] 09/30/2016, 5:41 PM

## 2016-09-30 NOTE — Progress Notes (Addendum)
Nutrition Consult/Brief Note  RD consulted for diet education regarding HTN.  Nurse Tech assisting patient upon visit.  Provided "Low Sodium Nutrition Therapy" from AND (Academy of Nutrition & Dietetics).  Body mass index is 27.6 kg/m. Patient meets criteria for Overweight based on current BMI.   Current diet order is Heart Healthy, patient is consuming approximately 100% of meals at this time.   Labs and medications reviewed.   No nutrition interventions warranted at this time. If nutrition issues arise, please consult RD.   Arthur Holms, RD, LDN Pager #: [phone] After-Hours Pager #: [phone]

## 2016-10-01 LAB — RENAL FUNCTION PANEL
ALBUMIN: 2.9 g/dL — AB (ref 3.5–5.0)
Anion gap: 8 (ref 5–15)
BUN: 48 mg/dL — ABNORMAL HIGH (ref 6–20)
CALCIUM: 8.8 mg/dL — AB (ref 8.9–10.3)
CO2: 25 mmol/L (ref 22–32)
CREATININE: 3.9 mg/dL — AB (ref 0.61–1.24)
Chloride: 104 mmol/L (ref 101–111)
GFR calc Af Amer: 21 mL/min — ABNORMAL LOW (ref >60)
GFR calc non Af Amer: 18 mL/min — ABNORMAL LOW (ref >60)
GLUCOSE: 87 mg/dL (ref 65–99)
PHOSPHORUS: 4.3 mg/dL (ref 2.5–4.6)
Potassium: 4.1 mmol/L (ref 3.5–5.1)
SODIUM: 137 mmol/L (ref 135–145)

## 2016-10-01 LAB — CBC WITH DIFFERENTIAL/PLATELET
BASOS PCT: 2 %
Basophils Absolute: 0.1 10*3/uL (ref 0.0–0.1)
EOS ABS: 0.3 10*3/uL (ref 0.0–0.7)
EOS PCT: 6 %
HEMATOCRIT: 29.9 % — AB (ref 39.0–52.0)
Hemoglobin: 9.6 g/dL — ABNORMAL LOW (ref 13.0–17.0)
Lymphocytes Relative: 30 %
Lymphs Abs: 1.4 10*3/uL (ref 0.7–4.0)
MCH: 25.4 pg — ABNORMAL LOW (ref 26.0–34.0)
MCHC: 32.1 g/dL (ref 30.0–36.0)
MCV: 79.1 fL (ref 78.0–100.0)
MONO ABS: 0.5 10*3/uL (ref 0.1–1.0)
MONOS PCT: 10 %
Neutro Abs: 2.4 10*3/uL (ref 1.7–7.7)
Neutrophils Relative %: 52 %
PLATELETS: 295 10*3/uL (ref 150–400)
RBC: 3.78 MIL/uL — ABNORMAL LOW (ref 4.22–5.81)
RDW: 16.3 % — AB (ref 11.5–15.5)
WBC: 4.5 10*3/uL (ref 4.0–10.5)

## 2016-10-01 MED ORDER — HYDRALAZINE HCL 50 MG PO TABS
50.0000 mg | ORAL_TABLET | Freq: Three times a day (TID) | ORAL | Status: DC
  Administered 2016-10-01 – 2016-10-03 (×6): 50 mg via ORAL
  Filled 2016-10-01 (×6): qty 1

## 2016-10-01 MED ORDER — FUROSEMIDE 80 MG PO TABS
80.0000 mg | ORAL_TABLET | Freq: Every day | ORAL | Status: DC
  Administered 2016-10-01 – 2016-10-03 (×3): 80 mg via ORAL
  Filled 2016-10-01: qty 1
  Filled 2016-10-01: qty 2
  Filled 2016-10-01: qty 1

## 2016-10-01 MED ORDER — ISOSORBIDE MONONITRATE ER 30 MG PO TB24
30.0000 mg | ORAL_TABLET | Freq: Every day | ORAL | Status: DC
  Administered 2016-10-01 – 2016-10-03 (×3): 30 mg via ORAL
  Filled 2016-10-01 (×3): qty 1

## 2016-10-01 MED ORDER — LABETALOL HCL 300 MG PO TABS
300.0000 mg | ORAL_TABLET | Freq: Two times a day (BID) | ORAL | Status: DC
  Administered 2016-10-01 – 2016-10-03 (×5): 300 mg via ORAL
  Filled 2016-10-01 (×5): qty 1

## 2016-10-01 NOTE — Consult Note (Addendum)
Renal Service Consult Note [facility]  Stanley [name] 10/01/2016 Sol Blazing Requesting Physician:  Dr Posey Pronto  Reason for Consult:  CKD and bad HTN HPI: The patient is a 40 y.o. year-old with history of high BP and tobacco use , admitted on 12/26 with SOB, CP and HA's.  In ED bp was 265/ 168.  Has been started on several BP meds and BP's now are 150-170/ 90- 100 range.  Pt feeling better. CXR w/o edema but did get Lasix IV x 1 and SOB improved.  Renal US no hydro, 10-11 cm kidneys. UA +proteinuria > 300, no rbc/wbcs.  ECHO with normal LVEF.    Patient grew up in [place], mother worked as a Engineer, manufacturing systems for [employer], father was Psychiatrist and did work on the [employer].  They moved to [place] when he was 16 for a better education.  He went to [employer], didn't finish HS.  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  Was married and has 1 son, is now divorced and not allowed to see his son.  +tobacco, no etoh or drug use.  No past surg history.  No heart disease or CVA.  No PCP.   Lives in [place] with his 24 yo brother, his nieces and his mother.    In old chart no prior admissions but on every ED visit over the past 5 years (usually minor trauma) his diastolic BP's were in 710 range.    ROS  denies CP  no joint pain   no HA  no blurry vision  no rash  no diarrhea  no nausea/ vomiting  no dysuria  no difficulty voiding  no change in urine color    Past Medical History  Past Medical History:  Diagnosis Date  . Hypertension   . Tobacco abuse    Past Surgical History History reviewed. No pertinent surgical history. Family History  Family History  Problem Relation Age of Onset  . Hypertension Mother   . Hypertension Brother   . Diabetes type II Brother    Social History  reports that he has been smoking.  He has never used smokeless  tobacco. He reports that he drinks alcohol. He reports that he does not use drugs. Allergies  Allergies  Allergen Reactions  . Lactose Intolerance (Gi) Nausea And Vomiting  . Other Swelling    All beans   Home medications Prior to Admission medications   Medication Sig Start Date End Date Taking? Authorizing Provider  DM-GG & DM-APAP-CPM (CORICIDIN HBP DAY/NIGHT COLD) 10-20 &15-200-2 MG MISC Take 2 capsules by mouth daily as needed (COLD).   Yes Historical Provider, MD  Misc Natural Products (PROSTATE CONTROL) CAPS Take 1 capsule by mouth daily.   Yes Historical Provider, MD  Multiple Vitamin (MULTIVITAMIN WITH MINERALS) TABS tablet Take 1 tablet by mouth daily.   Yes Historical Provider, MD  amLODipine (NORVASC) 5 MG tablet Take 1 tablet (5 mg total) by mouth daily. Patient not taking: Reported on 09/26/2016 08/24/15   Harvel Quale, MD  benazepril (LOTENSIN) 40 MG tablet Take 1 tablet (40 mg total) by mouth daily. Patient not taking: Reported on 09/26/2016 08/24/15   Harvel Quale, MD  lisinopril (PRINIVIL,ZESTRIL) 20 MG tablet Take 1 tablet (20 mg total) by mouth daily. Patient not taking: Reported on 09/26/2016 05/02/16   Nelda Severe  Kindl, MD   Liver Function Tests  Recent Labs Lab 09/29/16 0229 10/01/16 1140  AST 14*  --   ALT 11*  --   ALKPHOS 70  --   BILITOT 0.4  --   PROT 6.9  --   ALBUMIN 3.1* 2.9*   No results for input(s): LIPASE, AMYLASE in the last 168 hours. CBC  Recent Labs Lab 09/28/16 0313 09/29/16 0229 10/01/16 1141  WBC 7.7 6.6 4.5  NEUTROABS  --   --  2.4  HGB 10.0* 9.8* 9.6*  HCT 31.1* 31.4* 29.9*  MCV 79.1 81.8 79.1  PLT 337 302 407   Basic Metabolic Panel  Recent Labs Lab 09/26/16 2030 09/27/16 0301 09/27/16 0433 09/28/16 0313 09/29/16 0229 09/30/16 0341 10/01/16 1140  NA 135 135 135 135 134* 137 137  K 3.3* 3.2* 3.4* 3.8 4.2 3.9 4.1  CL 97* 96* 95* 96* 97* 102 104  CO2 32 25 25 27 24 27 25   GLUCOSE 106* 142* 118* 97 86 83 87   BUN 28* 32* 32* 30* 39* 45* 48*  CREATININE 3.18* 3.15* 3.16* 2.99* 3.55* 3.82* 3.90*  CALCIUM 9.0 9.4 9.8 9.1 8.8* 8.7* 8.8*  PHOS  --   --  4.4 4.3  --   --  4.3   Iron/TIBC/Ferritin/ %Sat No results found for: IRON, TIBC, FERRITIN, IRONPCTSAT  Vitals:   10/01/16 0542 10/01/16 0544 10/01/16 0815 10/01/16 1207  BP: (!) 177/107 (!) 156/97 (!) 145/87 (!) 167/107  Pulse:  64 66 62  Resp:   18 18  Temp:   98.2 F (36.8 C) 98.6 F (37 C)  TempSrc:   Oral Oral  SpO2:    99%  Weight:      Height:       Exam Gen alert, no distress, calm No rash, cyanosis or gangrene Sclera anicteric, throat clear  No jvd or bruits Chest clear bilat RRR no MRG Abd soft ntnd no mass or ascites +bs GU normal male MS no joint effusions or deformity Ext trace LE edema / no  wounds or ulcers Neuro is alert, Ox 3 , nf   Creat 3.9, BUN 48, K 4.1  HB 9.6  Assessment: 1  Uncontrolled HTN - improved since admission.  Has had years of untreated HTN looking back through ED visits.  Would not pursue ^'d plasma normetanephrines, they can be elevated in CKD patients.  Will d/c MTP and substitute labetalol, and will add daily po lasix 80 mg.   2  CKD stage IV - may have improvement in renal function over the next few mos as BP is controlled.  Creat bump with new control of severely elevated BP is not unexpected.  Will need office f/u , our office will arrange this with the patient next week.  Renal US was unrevealing.  UA showed proteinuria, could have underlying glomerular disease such as FSGS.   3  Tobacco   Plan - office f/u, medication changes as above, will sign off.    Kelly Splinter MD [employer] pager [phone]   10/01/2016, 3:05 PM

## 2016-10-01 NOTE — Progress Notes (Signed)
[facility] Progress Note  Patient: Stanley [name] DPO:[id number]   PCP: No PCP Per Patient DOB: Sep 12, 1976   DOA: 09/26/2016   DOS: 10/01/2016   Date of Service: the patient was seen and examined on 10/01/2016  Brief hospital course: Pt. with PMH of HTN, active smoker; admitted on 09/26/2016, with complaint of shortness of breath, was found to have hypertensive emergency with acute kidney injury. Currently further plan is blood pressure control.  Assessment and Plan: 1.Hypertensive emergency with some evidence of end organ damage in terms of nephropathy -Continue amlodipine 10, clonidine 0.2 every 8, metoprolol to 150 mg 2 times a day as blood pressure still not controlled. Hydralazine and Imdur added He has when necessary's for hydralazine 10-40 mg every 4 when necessary nausea 431-540 systolic  Chronic diastolic heart failure grade 2 Echo does show concentric severe hypertrophy  Does not appear volume overloaded. Monitor at present.  Chronic kidney disease stage III-4. Mild acute worsening. Nephrology consulted. Has high plasma metanephrines but the taste is not that acute it as a first line. Renal ultrasound was unremarkable. Nephrology recommends outpatient follow-up at present.  Hyperlipidemia Continue atorvastatin 40 daily  Bowel regimen: last BM 10/01/2016 Diet: Renal diet DVT Prophylaxis: subcutaneous Heparin  Advance goals of care discussion: Full code  Family Communication: no family was present at bedside, at the time of interview.   Disposition:  Discharge to home. Expected discharge date: 10/02/2016, improvement in blood pressure control  Consultants: Nephrology Procedures: None  Antibiotics: Anti-infectives    None        Subjective: Feeling better, wants to go home  Objective: Physical Exam: Vitals:   10/01/16 0544 10/01/16 0815 10/01/16 1207 10/01/16 1618  BP: (!) 156/97 (!) 145/87 (!) 167/107 (!) 142/81  Pulse: 64 66 62 66  Resp:   18 18   Temp:  98.2 F (36.8 C) 98.6 F (37 C)   TempSrc:  Oral Oral   SpO2:   99%   Weight:      Height:        Intake/Output Summary (Last 24 hours) at 10/01/16 1759 Last data filed at 10/01/16 1600  Gross per 24 hour  Intake              720 ml  Output             1400 ml  Net             -680 ml   Filed Weights   09/29/16 0453 09/30/16 0501 10/01/16 0435  Weight: 91.9 kg (202 lb 9.6 oz) 92.3 kg (203 lb 7.8 oz) 93.5 kg (206 lb 1.6 oz)    General: Alert, Awake and Oriented to Time, Place and Person. Appear in mild distress, affect appropriate Eyes: PERRL, Conjunctiva normal ENT: Oral Mucosa clear moist. Neck: no JVD, no Abnormal Mass Or lumps Cardiovascular: S1 and S2 Present, no Murmur, Respiratory: Bilateral Air entry equal and Decreased, no use of accessory muscle, Clear to Auscultation, no Crackles, no wheezes Abdomen: Bowel Sound present, Soft and no tenderness Skin: no redness, no Rash, no induration Extremities: no Pedal edema, no calf tenderness Neurologic: Grossly no focal neuro deficit. Bilaterally Equal motor strength  Data Reviewed: CBC:  Recent Labs Lab 09/26/16 2030 09/27/16 0433 09/28/16 0313 09/29/16 0229 10/01/16 1141  WBC 8.6 10.1 7.7 6.6 4.5  NEUTROABS  --   --   --   --  2.4  HGB 10.7* 10.7* 10.0* 9.8* 9.6*  HCT 33.4* 33.1* 31.1* 31.4*  29.9*  MCV 78.4 78.3 79.1 81.8 79.1  PLT 330 350 337 302 431   Basic Metabolic Panel:  Recent Labs Lab 09/27/16 0433 09/28/16 0313 09/29/16 0229 09/30/16 0341 10/01/16 1140  NA 135 135 134* 137 137  K 3.4* 3.8 4.2 3.9 4.1  CL 95* 96* 97* 102 104  CO2 25 27 24 27 25   GLUCOSE 118* 97 86 83 87  BUN 32* 30* 39* 45* 48*  CREATININE 3.16* 2.99* 3.55* 3.82* 3.90*  CALCIUM 9.8 9.1 8.8* 8.7* 8.8*  MG 2.1 2.1  --   --   --   PHOS 4.4 4.3  --   --  4.3    Liver Function Tests:  Recent Labs Lab 09/29/16 0229 10/01/16 1140  AST 14*  --   ALT 11*  --   ALKPHOS 70  --   BILITOT 0.4  --   PROT 6.9   --   ALBUMIN 3.1* 2.9*   No results for input(s): LIPASE, AMYLASE in the last 168 hours. No results for input(s): AMMONIA in the last 168 hours. Coagulation Profile: No results for input(s): INR, PROTIME in the last 168 hours. Cardiac Enzymes: No results for input(s): CKTOTAL, CKMB, CKMBINDEX, TROPONINI in the last 168 hours. BNP (last 3 results) No results for input(s): PROBNP in the last 8760 hours.  CBG: No results for input(s): GLUCAP in the last 168 hours.  Studies: US Renal  Result Date: 09/30/2016 CLINICAL DATA:  Acute kidney injury. EXAM: RENAL / URINARY TRACT ULTRASOUND COMPLETE COMPARISON:  None. FINDINGS: Right Kidney: Length: 10 cm. Increased echogenicity of renal parenchyma is noted suggesting medical renal disease. No mass or hydronephrosis visualized. Left Kidney: Length: 11 cm. Increased echogenicity of renal parenchyma is noted suggesting medical renal disease. No mass or hydronephrosis visualized. Bladder: Appears normal for degree of bladder distention. Bilateral ureteral jets are noted. IMPRESSION: Increased echogenicity of renal parenchyma is noted bilaterally suggesting medical renal disease. No hydronephrosis or renal obstruction is noted. Electronically Signed   By: Marijo Conception, M.D.   On: 09/30/2016 19:38     Scheduled Meds: . amLODipine  10 mg Oral Daily  . aspirin EC  325 mg Oral Daily  . atorvastatin  40 mg Oral q1800  . cloNIDine  0.2 mg Oral Q8H  . enoxaparin (LOVENOX) injection  30 mg Subcutaneous Q24H  . furosemide  80 mg Oral Daily  . hydrALAZINE  50 mg Oral Q8H  . isosorbide mononitrate  30 mg Oral Daily  . labetalol  300 mg Oral BID  . multivitamin with minerals  1 tablet Oral Daily  . nicotine  21 mg Transdermal Q24H   Continuous Infusions: PRN Meds: acetaminophen, hydrOXYzine, ondansetron (ZOFRAN) IV, zolpidem  Time spent: 30 minutes  Author: Berle Mull, MD [facility] Pager: [phone] 10/01/2016 5:59 PM  If 7PM-7AM,  please contact night-coverage at [email or web address], password [facility]

## 2016-10-02 DIAGNOSIS — I160 Hypertensive urgency: Secondary | ICD-10-CM

## 2016-10-02 DIAGNOSIS — E876 Hypokalemia: Secondary | ICD-10-CM

## 2016-10-02 DIAGNOSIS — R748 Abnormal levels of other serum enzymes: Secondary | ICD-10-CM

## 2016-10-02 LAB — RENAL FUNCTION PANEL
ALBUMIN: 2.7 g/dL — AB (ref 3.5–5.0)
Anion gap: 8 (ref 5–15)
BUN: 46 mg/dL — AB (ref 6–20)
CO2: 27 mmol/L (ref 22–32)
CREATININE: 3.82 mg/dL — AB (ref 0.61–1.24)
Calcium: 8.8 mg/dL — ABNORMAL LOW (ref 8.9–10.3)
Chloride: 104 mmol/L (ref 101–111)
GFR calc Af Amer: 21 mL/min — ABNORMAL LOW (ref >60)
GFR, EST NON AFRICAN AMERICAN: 18 mL/min — AB (ref >60)
Glucose, Bld: 84 mg/dL (ref 65–99)
PHOSPHORUS: 5.6 mg/dL — AB (ref 2.5–4.6)
POTASSIUM: 3.8 mmol/L (ref 3.5–5.1)
Sodium: 139 mmol/L (ref 135–145)

## 2016-10-02 NOTE — Progress Notes (Signed)
PROGRESS NOTE    Stanley [name]  VVO:[id number] DOB: 1976-10-01 DOA: 09/26/2016 PCP: No PCP Per Patient    Brief Narrative:  Pt. with PMH of HTN, active smoker; admitted on 09/26/2016, with complaint of shortness of breath, was found to have hypertensive emergency with acute kidney injury. Currently further plan is blood pressure control.   Assessment & Plan:   Principal Problem:   Hypertensive emergency Active Problems:   Acute CHF ([place])   Tobacco abuse   Elevated troponin   Hypertension   Hypokalemia   AKI (acute kidney injury) ([place])   Hypertensive urgency   Acute congestive heart failure (H[facility])   Acute dyspnea   Hypertensive emergency with some evidence of end organ damage in terms of nephropathy -Continue amlodipine 10, clonidine 0.2 every 8, metoprolol to 150 mg 2 times a day as blood pressure still not controlled. Hydralazine and Imdur added (hydralazine 50mg  q8h and Imdur 30mg  daily) Today is first day without any IV medications written If BP controlled can consider d/c tomorrow  Chronic diastolic heart failure grade 2 Echo does show concentric severe hypertrophy  Does not appear volume overloaded. Monitor at present.  Chronic kidney disease stage III-4. Mild acute worsening. Nephrology consulted. Renal ultrasound was unremarkable. Nephrology recommends outpatient follow-up at present and believe kidney function will improve with BP control Cr slightly improved today Repeat BMP in am  Hyperlipidemia Continue atorvastatin 40 daily  Bowel regimen: last BM 10/01/2016 Diet: Renal diet DVT Prophylaxis: subcutaneous Heparin Code Status: Full code Family Communication: no family was present at bedside, at the time of interview.  Disposition: Discharge to home. Expected discharge date can likely d/c on 10/03/16 if BP stays controlled as today is first day off IV BP medications  Consultants:   Nephrology  Procedures:   None  Antimicrobials:   None     Subjective: Patient states he feel fantastic today.  No acute events overnight and no new concerns noted.  Objective: Vitals:   10/01/16 1808 10/01/16 1956 10/02/16 0451 10/02/16 1345  BP: (!) 150/78 (!) 148/82 (!) 168/102 139/66  Pulse: 70 68 61 68  Resp:  18 18 20   Temp:  97.8 F (36.6 C) 97.4 F (36.3 C) 97.9 F (36.6 C)  TempSrc:  Oral Oral Oral  SpO2:  98% 99% 99%  Weight:   92.6 kg (204 lb 1.6 oz)   Height:        Intake/Output Summary (Last 24 hours) at 10/02/16 1551 Last data filed at 10/02/16 1300  Gross per 24 hour  Intake              720 ml  Output             3000 ml  Net            -2280 ml   Filed Weights   09/30/16 0501 10/01/16 0435 10/02/16 0451  Weight: 92.3 kg (203 lb 7.8 oz) 93.5 kg (206 lb 1.6 oz) 92.6 kg (204 lb 1.6 oz)    Examination:  General exam: Appears calm and comfortable  Respiratory system: Clear to auscultation. Respiratory effort normal. Cardiovascular system: S1 & S2 heard, RRR. No JVD, murmurs, rubs, gallops or clicks. No pedal edema. Gastrointestinal system: Abdomen is nondistended, soft and nontender. No organomegaly or masses felt. Normal bowel sounds heard. Central nervous system: Alert and oriented. No focal neurological deficits. Extremities: Symmetric 5 x 5 power. Skin: No rashes, lesions or ulcers Psychiatry: Judgement and insight appear normal. Mood & affect  appropriate.     Data Reviewed: I have personally reviewed following labs and imaging studies  CBC:  Recent Labs Lab 09/26/16 2030 09/27/16 0433 09/28/16 0313 09/29/16 0229 10/01/16 1141  WBC 8.6 10.1 7.7 6.6 4.5  NEUTROABS  --   --   --   --  2.4  HGB 10.7* 10.7* 10.0* 9.8* 9.6*  HCT 33.4* 33.1* 31.1* 31.4* 29.9*  MCV 78.4 78.3 79.1 81.8 79.1  PLT 330 350 337 302 381   Basic Metabolic Panel:  Recent Labs Lab 09/27/16 0433 09/28/16 0313 09/29/16 0229 09/30/16 0341 10/01/16 1140 10/02/16 0407  NA 135 135 134* 137 137 139  K 3.4* 3.8 4.2 3.9  4.1 3.8  CL 95* 96* 97* 102 104 104  CO2 25 27 24 27 25 27   GLUCOSE 118* 97 86 83 87 84  BUN 32* 30* 39* 45* 48* 46*  CREATININE 3.16* 2.99* 3.55* 3.82* 3.90* 3.82*  CALCIUM 9.8 9.1 8.8* 8.7* 8.8* 8.8*  MG 2.1 2.1  --   --   --   --   PHOS 4.4 4.3  --   --  4.3 5.6*   GFR: Estimated Creatinine Clearance: 28.5 mL/min (by C-G formula based on SCr of 3.82 mg/dL (H)). Liver Function Tests:  Recent Labs Lab 09/29/16 0229 10/01/16 1140 10/02/16 0407  AST 14*  --   --   ALT 11*  --   --   ALKPHOS 70  --   --   BILITOT 0.4  --   --   PROT 6.9  --   --   ALBUMIN 3.1* 2.9* 2.7*   No results for input(s): LIPASE, AMYLASE in the last 168 hours. No results for input(s): AMMONIA in the last 168 hours. Coagulation Profile: No results for input(s): INR, PROTIME in the last 168 hours. Cardiac Enzymes: No results for input(s): CKTOTAL, CKMB, CKMBINDEX, TROPONINI in the last 168 hours. BNP (last 3 results) No results for input(s): PROBNP in the last 8760 hours. HbA1C: No results for input(s): HGBA1C in the last 72 hours. CBG: No results for input(s): GLUCAP in the last 168 hours. Lipid Profile: No results for input(s): CHOL, HDL, LDLCALC, TRIG, CHOLHDL, LDLDIRECT in the last 72 hours. Thyroid Function Tests: No results for input(s): TSH, T4TOTAL, FREET4, T3FREE, THYROIDAB in the last 72 hours. Anemia Panel: No results for input(s): VITAMINB12, FOLATE, FERRITIN, TIBC, IRON, RETICCTPCT in the last 72 hours. Sepsis Labs: No results for input(s): PROCALCITON, LATICACIDVEN in the last 168 hours.  Recent Results (from the past 240 hour(s))  MRSA PCR Screening     Status: None   Collection Time: 09/27/16  5:26 AM  Result Value Ref Range Status   MRSA by PCR NEGATIVE NEGATIVE Final    Comment:        The GeneXpert MRSA Assay (FDA approved for NASAL specimens only), is one component of a comprehensive MRSA colonization surveillance program. It is not intended to diagnose MRSA infection  nor to guide or monitor treatment for MRSA infections.          Radiology Studies: US Renal  Result Date: 09/30/2016 CLINICAL DATA:  Acute kidney injury. EXAM: RENAL / URINARY TRACT ULTRASOUND COMPLETE COMPARISON:  None. FINDINGS: Right Kidney: Length: 10 cm. Increased echogenicity of renal parenchyma is noted suggesting medical renal disease. No mass or hydronephrosis visualized. Left Kidney: Length: 11 cm. Increased echogenicity of renal parenchyma is noted suggesting medical renal disease. No mass or hydronephrosis visualized. Bladder: Appears normal for degree of bladder  distention. Bilateral ureteral jets are noted. IMPRESSION: Increased echogenicity of renal parenchyma is noted bilaterally suggesting medical renal disease. No hydronephrosis or renal obstruction is noted. Electronically Signed   By: Marijo Conception, M.D.   On: 09/30/2016 19:38        Scheduled Meds: . amLODipine  10 mg Oral Daily  . aspirin EC  325 mg Oral Daily  . atorvastatin  40 mg Oral q1800  . cloNIDine  0.2 mg Oral Q8H  . enoxaparin (LOVENOX) injection  30 mg Subcutaneous Q24H  . furosemide  80 mg Oral Daily  . hydrALAZINE  50 mg Oral Q8H  . isosorbide mononitrate  30 mg Oral Daily  . labetalol  300 mg Oral BID  . multivitamin with minerals  1 tablet Oral Daily  . nicotine  21 mg Transdermal Q24H   Continuous Infusions:   LOS: 6 days    Time spent: 30 minutes    Loretha Stapler, MD [facility] Pager [phone]  If 7PM-7AM, please contact night-coverage [email or web address] Password [email or web address] 10/02/2016, 3:51 PM

## 2016-10-03 ENCOUNTER — Telehealth: Payer: Self-pay | Admitting: Surgery

## 2016-10-03 DIAGNOSIS — Z720 Tobacco use: Secondary | ICD-10-CM

## 2016-10-03 LAB — RENAL FUNCTION PANEL
ALBUMIN: 2.8 g/dL — AB (ref 3.5–5.0)
Anion gap: 11 (ref 5–15)
BUN: 42 mg/dL — ABNORMAL HIGH (ref 6–20)
CHLORIDE: 101 mmol/L (ref 101–111)
CO2: 26 mmol/L (ref 22–32)
Calcium: 8.9 mg/dL (ref 8.9–10.3)
Creatinine, Ser: 3.85 mg/dL — ABNORMAL HIGH (ref 0.61–1.24)
GFR calc Af Amer: 21 mL/min — ABNORMAL LOW (ref >60)
GFR calc non Af Amer: 18 mL/min — ABNORMAL LOW (ref >60)
GLUCOSE: 95 mg/dL (ref 65–99)
PHOSPHORUS: 5.9 mg/dL — AB (ref 2.5–4.6)
POTASSIUM: 3.7 mmol/L (ref 3.5–5.1)
Sodium: 138 mmol/L (ref 135–145)

## 2016-10-03 MED ORDER — AMLODIPINE BESYLATE 10 MG PO TABS
10.0000 mg | ORAL_TABLET | Freq: Every day | ORAL | 0 refills | Status: DC
Start: 2016-10-03 — End: 2016-11-02

## 2016-10-03 MED ORDER — ASPIRIN 325 MG PO TBEC: 325.0000 mg | DELAYED_RELEASE_TABLET | Freq: Every day | ORAL | 0 refills | Status: DC

## 2016-10-03 MED ORDER — HYDRALAZINE HCL 50 MG PO TABS: 50.0000 mg | ORAL_TABLET | Freq: Three times a day (TID) | ORAL | 0 refills | Status: DC

## 2016-10-03 MED ORDER — ATORVASTATIN CALCIUM 40 MG PO TABS: 40.0000 mg | ORAL_TABLET | Freq: Every day | ORAL | 0 refills | Status: DC

## 2016-10-03 MED ORDER — FUROSEMIDE 80 MG PO TABS: 80.0000 mg | ORAL_TABLET | Freq: Every day | ORAL | 0 refills | Status: DC

## 2016-10-03 MED ORDER — ISOSORBIDE MONONITRATE ER 30 MG PO TB24
30.0000 mg | ORAL_TABLET | Freq: Every day | ORAL | 0 refills | Status: DC
Start: 2016-10-03 — End: 2016-11-02

## 2016-10-03 MED ORDER — LABETALOL HCL 300 MG PO TABS: 300.0000 mg | ORAL_TABLET | Freq: Two times a day (BID) | ORAL | 0 refills | Status: DC

## 2016-10-03 MED ORDER — CLONIDINE HCL 0.2 MG PO TABS: 0.2000 mg | ORAL_TABLET | Freq: Three times a day (TID) | ORAL | 0 refills | Status: DC

## 2016-10-03 NOTE — Discharge Summary (Addendum)
Physician Discharge Summary  Stanley [name] VOZ:[id number] DOB: 04/30/1976 DOA: 09/26/2016  PCP: No PCP Per Patient  Admit date: 09/26/2016 Discharge date: 10/03/2016  Admitted From: Home Disposition:  Home  Recommendations for Outpatient Follow-up:  1. Establish care with a PCP in 1-2 weeks 2. Please obtain BMP/CBC in one week 3. Schedule follow up with Dr. Jonnie Finner of nephrology at earliest available appointment 4. Take new medications as prescribed 5. Please establish care with cardiology outpatient   Home Health: No Equipment/Devices: None  Discharge Condition: Stable CODE STATUS:Full Code Diet recommendation: Heart Healthy  Brief/Interim Summary: Pt. with PMH of HTN, active smoker; admitted on 09/26/2016, with complaint of shortness of breath, was found to have hypertensive emergency with acute kidney injury. Nephrology was consulted and recommend outpatient follow up for monitoring of kidney disease and blood pressure management.  Patient was started on new antihypertensive medications while in hospital and all medications were transitioned to PO prior to discharge.  He was stable and BP was well controlled prior to discharge.  Discharge Diagnoses:  Principal Problem:   Hypertensive emergency Active Problems:   Acute CHF ([place])   Tobacco abuse   Elevated troponin   Hypertension   Hypokalemia   AKI (acute kidney injury) ([facility] [place])   Hypertensive urgency   Acute congestive heart failure (H[facility])   Acute dyspnea  Hypertensive emergency with some evidence of end organ damage in terms of nephropathy -Continue amlodipine 10, clonidine 0.2 every 8, metoprolol to 150 mg 2 times a day as blood pressure still not controlled. Hydralazine and Imdur added (hydralazine 50mg  q8h and Imdur 30mg  daily)  Chronic diastolic heart failure grade 2 Echo does show concentric severe hypertrophy  Continue with lasix 80mg  daily  Chronic kidney disease stage III-4. Mild acute worsening. Nephrology  consulted. Renal ultrasound was unremarkable. Nephrology recommends outpatient follow-up at present and believe kidney function will improve with BP control Cr slightly improved today  Hyperlipidemia Continue atorvastatin 40 daily  Discharge Instructions  Discharge Instructions    Call MD for:  difficulty breathing, headache or visual disturbances    Complete by:  As directed    Call MD for:  extreme fatigue    Complete by:  As directed    Call MD for:  persistant dizziness or light-headedness    Complete by:  As directed    Call MD for:  persistant nausea and vomiting    Complete by:  As directed    Call MD for:  severe uncontrolled pain    Complete by:  As directed    Call MD for:  temperature >100.4    Complete by:  As directed    Diet - low sodium heart healthy    Complete by:  As directed    Increase activity slowly    Complete by:  As directed      Allergies as of 10/03/2016      Reactions   Lactose Intolerance (gi) Nausea And Vomiting   Other Swelling   All beans      Medication List    STOP taking these medications   benazepril 40 MG tablet Commonly known as:  LOTENSIN   CORICIDIN HBP DAY/NIGHT COLD 10-20 &15-200-2 MG Misc Generic drug:  DM-GG & DM-APAP-CPM   lisinopril 20 MG tablet Commonly known as:  PRINIVIL,ZESTRIL     TAKE these medications   amLODipine 10 MG tablet Commonly known as:  NORVASC Take 1 tablet (10 mg total) by mouth daily. What changed:  medication strength  how much to take   aspirin 325 MG EC tablet Take 1 tablet (325 mg total) by mouth daily.   atorvastatin 40 MG tablet Commonly known as:  LIPITOR Take 1 tablet (40 mg total) by mouth daily at 6 PM.   cloNIDine 0.2 MG tablet Commonly known as:  CATAPRES Take 1 tablet (0.2 mg total) by mouth every 8 (eight) hours.   furosemide 80 MG tablet Commonly known as:  LASIX Take 1 tablet (80 mg total) by mouth daily.   hydrALAZINE 50 MG tablet Commonly known as:   APRESOLINE Take 1 tablet (50 mg total) by mouth every 8 (eight) hours.   isosorbide mononitrate 30 MG 24 hr tablet Commonly known as:  IMDUR Take 1 tablet (30 mg total) by mouth daily.   labetalol 300 MG tablet Commonly known as:  NORMODYNE Take 1 tablet (300 mg total) by mouth 2 (two) times daily.   multivitamin with minerals Tabs tablet Take 1 tablet by mouth daily.   PROSTATE CONTROL Caps Take 1 capsule by mouth daily.      Follow-up Information    SCHERTZ,ROBERT D, MD. Schedule an appointment as soon as possible for a visit in 1 week(s).   Specialty:  Nephrology Contact information: [place] [address] [address]. Schedule an appointment as soon as possible for a visit.   Why:  to establish care Contact information: [place] [phone] [place] [facility]. Schedule an appointment as soon as possible for a visit in 2 week(s).   Specialty:  Cardiology Contact information: [address], [place] [address] [phone]         Allergies  Allergen Reactions  . Lactose Intolerance (Gi) Nausea And Vomiting  . Other Swelling    All beans    Consultations:  Nephrology  PT   Procedures/Studies: US Renal  Result Date: 09/30/2016 CLINICAL DATA:  Acute kidney injury. EXAM: RENAL / URINARY TRACT ULTRASOUND COMPLETE COMPARISON:  None. FINDINGS: Right Kidney: Length: 10 cm. Increased echogenicity of renal parenchyma is noted suggesting medical renal disease. No mass or hydronephrosis visualized. Left Kidney: Length: 11 cm. Increased echogenicity of renal parenchyma is noted suggesting medical renal disease. No mass or hydronephrosis visualized. Bladder: Appears normal for degree of bladder distention. Bilateral ureteral jets are noted. IMPRESSION: Increased echogenicity of renal parenchyma is noted  bilaterally suggesting medical renal disease. No hydronephrosis or renal obstruction is noted. Electronically Signed   By: Marijo Conception, M.D.   On: 09/30/2016 19:38   Dg Chest Portable 1 View  Result Date: 09/26/2016 CLINICAL DATA:  Dyspnea.  Cough and congestion. EXAM: PORTABLE CHEST 1 VIEW COMPARISON:  Frontal and lateral views 05/19/2016 FINDINGS: Mild cardiomegaly, likely accentuated by portable AP technique. Normal mediastinal contours. No pulmonary edema. No focal airspace opacity, large pleural effusion or pneumothorax. No acute osseous abnormality is seen. IMPRESSION: Mild cardiomegaly. No congestive failure or acute pulmonary abnormality. Electronically Signed   By: Jeb Levering M.D.   On: 09/26/2016 21:20   Echocardiogram: There is severe concentric left ventricular hypertrophy with grade 2 diastolic dysfunction and elevated filling pressures and severe left atrial enlargement.   Subjective: Patient very excited and hopeful to go home today.  He understands the results of the Echo and the need to follow up.  He says he will call to schedule  his follow up's either today or tomorrow (pending if offices are open).  No concerns noted and no acute events overnight.  Discharge Exam: Vitals:   10/02/16 1956 10/03/16 0442  BP: (!) 163/98 (!) 152/92  Pulse: 64 68  Resp: 18 16  Temp: 97.5 F (36.4 C) 98.4 F (36.9 C)   Vitals:   10/02/16 0451 10/02/16 1345 10/02/16 1956 10/03/16 0442  BP: (!) 168/102 139/66 (!) 163/98 (!) 152/92  Pulse: 61 68 64 68  Resp: 18 20 18 16   Temp: 97.4 F (36.3 C) 97.9 F (36.6 C) 97.5 F (36.4 C) 98.4 F (36.9 C)  TempSrc: Oral Oral Oral Oral  SpO2: 99% 99% 100% 99%  Weight: 92.6 kg (204 lb 1.6 oz)   92.9 kg (204 lb 12.8 oz)  Height:        General: Pt is alert, awake, not in acute distress Cardiovascular: RRR, S1/S2 +, no rubs, no gallops Respiratory: CTA bilaterally, no wheezing, no rhonchi Abdominal: Soft, NT, ND, bowel sounds  + Extremities: no edema, no cyanosis    The results of significant diagnostics from this hospitalization (including imaging, microbiology, ancillary and laboratory) are listed below for reference.     Microbiology: Recent Results (from the past 240 hour(s))  MRSA PCR Screening     Status: None   Collection Time: 09/27/16  5:26 AM  Result Value Ref Range Status   MRSA by PCR NEGATIVE NEGATIVE Final    Comment:        The GeneXpert MRSA Assay (FDA approved for NASAL specimens only), is one component of a comprehensive MRSA colonization surveillance program. It is not intended to diagnose MRSA infection nor to guide or monitor treatment for MRSA infections.      Labs: BNP (last 3 results)  Recent Labs  09/26/16 2030  BNP 9,381.0*   Basic Metabolic Panel:  Recent Labs Lab 09/27/16 0433 09/28/16 0313 09/29/16 0229 09/30/16 0341 10/01/16 1140 10/02/16 0407 10/03/16 0243  NA 135 135 134* 137 137 139 138  K 3.4* 3.8 4.2 3.9 4.1 3.8 3.7  CL 95* 96* 97* 102 104 104 101  CO2 25 27 24 27 25 27 26   GLUCOSE 118* 97 86 83 87 84 95  BUN 32* 30* 39* 45* 48* 46* 42*  CREATININE 3.16* 2.99* 3.55* 3.82* 3.90* 3.82* 3.85*  CALCIUM 9.8 9.1 8.8* 8.7* 8.8* 8.8* 8.9  MG 2.1 2.1  --   --   --   --   --   PHOS 4.4 4.3  --   --  4.3 5.6* 5.9*   Liver Function Tests:  Recent Labs Lab 09/29/16 0229 10/01/16 1140 10/02/16 0407 10/03/16 0243  AST 14*  --   --   --   ALT 11*  --   --   --   ALKPHOS 70  --   --   --   BILITOT 0.4  --   --   --   PROT 6.9  --   --   --   ALBUMIN 3.1* 2.9* 2.7* 2.8*   No results for input(s): LIPASE, AMYLASE in the last 168 hours. No results for input(s): AMMONIA in the last 168 hours. CBC:  Recent Labs Lab 09/26/16 2030 09/27/16 0433 09/28/16 0313 09/29/16 0229 10/01/16 1141  WBC 8.6 10.1 7.7 6.6 4.5  NEUTROABS  --   --   --   --  2.4  HGB 10.7* 10.7* 10.0* 9.8* 9.6*  HCT 33.4* 33.1* 31.1* 31.4* 29.9*  MCV  78.4 78.3 79.1 81.8 79.1   PLT 330 350 337 302 295   Cardiac Enzymes: No results for input(s): CKTOTAL, CKMB, CKMBINDEX, TROPONINI in the last 168 hours. BNP: Invalid input(s): POCBNP CBG: No results for input(s): GLUCAP in the last 168 hours. D-Dimer No results for input(s): DDIMER in the last 72 hours. Hgb A1c No results for input(s): HGBA1C in the last 72 hours. Lipid Profile No results for input(s): CHOL, HDL, LDLCALC, TRIG, CHOLHDL, LDLDIRECT in the last 72 hours. Thyroid function studies No results for input(s): TSH, T4TOTAL, T3FREE, THYROIDAB in the last 72 hours.  Invalid input(s): FREET3 Anemia work up No results for input(s): VITAMINB12, FOLATE, FERRITIN, TIBC, IRON, RETICCTPCT in the last 72 hours. Urinalysis    Component Value Date/Time   COLORURINE STRAW (A) 09/27/2016 0017   APPEARANCEUR CLEAR 09/27/2016 0017   LABSPEC 1.010 09/27/2016 0017   PHURINE 7.0 09/27/2016 0017   GLUCOSEU 50 (A) 09/27/2016 0017   HGBUR NEGATIVE 09/27/2016 0017   BILIRUBINUR NEGATIVE 09/27/2016 0017   KETONESUR NEGATIVE 09/27/2016 0017   PROTEINUR >=300 (A) 09/27/2016 0017   UROBILINOGEN 0.2 09/29/2009 1630   NITRITE NEGATIVE 09/27/2016 0017   LEUKOCYTESUR NEGATIVE 09/27/2016 0017   Sepsis Labs Invalid input(s): PROCALCITONIN,  WBC,  LACTICIDVEN Microbiology Recent Results (from the past 240 hour(s))  MRSA PCR Screening     Status: None   Collection Time: 09/27/16  5:26 AM  Result Value Ref Range Status   MRSA by PCR NEGATIVE NEGATIVE Final    Comment:        The GeneXpert MRSA Assay (FDA approved for NASAL specimens only), is one component of a comprehensive MRSA colonization surveillance program. It is not intended to diagnose MRSA infection nor to guide or monitor treatment for MRSA infections.      Time coordinating discharge: Over 30 minutes  SIGNED:   Loretha Stapler, MD  [facility] 10/03/2016, 10:29 AM Pager [phone] If 7PM-7AM, please contact  night-coverage [email or web address] Password [email or web address]

## 2016-10-03 NOTE — Progress Notes (Signed)
Stanley [name] to be D/C'd Home per MD order. Discussed with the patient and all questions fully answered.  Allergies as of 10/03/2016      Reactions   Lactose Intolerance (gi) Nausea And Vomiting   Other Swelling   All beans      Medication List    STOP taking these medications   benazepril 40 MG tablet Commonly known as:  LOTENSIN   CORICIDIN HBP DAY/NIGHT COLD 10-20 &15-200-2 MG Misc Generic drug:  DM-GG & DM-APAP-CPM   lisinopril 20 MG tablet Commonly known as:  PRINIVIL,ZESTRIL     TAKE these medications   amLODipine 10 MG tablet Commonly known as:  NORVASC Take 1 tablet (10 mg total) by mouth daily. What changed:  medication strength  how much to take   aspirin 325 MG EC tablet Take 1 tablet (325 mg total) by mouth daily.   atorvastatin 40 MG tablet Commonly known as:  LIPITOR Take 1 tablet (40 mg total) by mouth daily at 6 PM.   cloNIDine 0.2 MG tablet Commonly known as:  CATAPRES Take 1 tablet (0.2 mg total) by mouth every 8 (eight) hours.   furosemide 80 MG tablet Commonly known as:  LASIX Take 1 tablet (80 mg total) by mouth daily.   hydrALAZINE 50 MG tablet Commonly known as:  APRESOLINE Take 1 tablet (50 mg total) by mouth every 8 (eight) hours.   isosorbide mononitrate 30 MG 24 hr tablet Commonly known as:  IMDUR Take 1 tablet (30 mg total) by mouth daily.   labetalol 300 MG tablet Commonly known as:  NORMODYNE Take 1 tablet (300 mg total) by mouth 2 (two) times daily.   multivitamin with minerals Tabs tablet Take 1 tablet by mouth daily.   PROSTATE CONTROL Caps Take 1 capsule by mouth daily.       VVS, Skin clean, dry and intact without evidence of skin break down, no evidence of skin tears noted.  IV catheter discontinued intact. Site without signs and symptoms of complications. Dressing and pressure applied.  An After Visit Summary was printed and given to the patient.  Patient to D/C home via private auto.  [facility] Numbers   10/03/2016 6:24 PM

## 2016-10-03 NOTE — Telephone Encounter (Signed)
ED CM received call from patient regarding the [facility] letter patient received not being able to be  Processed by [facility].  CM reviewed information, all entered correctly. Instructed pharmacy to process information again, if still having issues contact ED CM at [phone].

## 2016-10-03 NOTE — Care Management (Signed)
Patient received [facility] letter. Ricki Miller, RN BSN Case Manager

## 2016-10-27 ENCOUNTER — Ambulatory Visit: Payer: Self-pay | Attending: Internal Medicine

## 2016-10-27 ENCOUNTER — Ambulatory Visit: Payer: Self-pay | Admitting: Family Medicine

## 2016-10-28 MED FILL — hydrALAZINE HCL 100 MG TABS: 100 | 30 days supply | Qty: 90 | Fill #0

## 2016-11-02 ENCOUNTER — Ambulatory Visit (INDEPENDENT_AMBULATORY_CARE_PROVIDER_SITE_OTHER): Payer: Self-pay | Admitting: Cardiology

## 2016-11-02 ENCOUNTER — Ambulatory Visit: Payer: Self-pay | Attending: Family Medicine | Admitting: Physician Assistant

## 2016-11-02 ENCOUNTER — Encounter: Payer: Self-pay | Admitting: Physician Assistant

## 2016-11-02 ENCOUNTER — Encounter (INDEPENDENT_AMBULATORY_CARE_PROVIDER_SITE_OTHER): Payer: Self-pay

## 2016-11-02 ENCOUNTER — Encounter: Payer: Self-pay | Admitting: Cardiology

## 2016-11-02 VITALS — BP 182/110 | HR 66 | Temp 97.9°F | Resp 16 | Wt 217.4 lb

## 2016-11-02 VITALS — BP 180/124 | HR 70 | Ht 72.0 in | Wt 216.1 lb

## 2016-11-02 DIAGNOSIS — I129 Hypertensive chronic kidney disease with stage 1 through stage 4 chronic kidney disease, or unspecified chronic kidney disease: Secondary | ICD-10-CM | POA: Insufficient documentation

## 2016-11-02 DIAGNOSIS — N183 Chronic kidney disease, stage 3 unspecified: Secondary | ICD-10-CM

## 2016-11-02 DIAGNOSIS — I10 Essential (primary) hypertension: Secondary | ICD-10-CM

## 2016-11-02 DIAGNOSIS — N184 Chronic kidney disease, stage 4 (severe): Secondary | ICD-10-CM

## 2016-11-02 DIAGNOSIS — F17200 Nicotine dependence, unspecified, uncomplicated: Secondary | ICD-10-CM | POA: Insufficient documentation

## 2016-11-02 DIAGNOSIS — I5032 Chronic diastolic (congestive) heart failure: Secondary | ICD-10-CM

## 2016-11-02 DIAGNOSIS — I517 Cardiomegaly: Secondary | ICD-10-CM

## 2016-11-02 DIAGNOSIS — N179 Acute kidney failure, unspecified: Secondary | ICD-10-CM | POA: Insufficient documentation

## 2016-11-02 LAB — CBC WITH DIFFERENTIAL/PLATELET
BASOS PCT: 1 %
Basophils Absolute: 46 cells/uL (ref 0–200)
EOS ABS: 184 {cells}/uL (ref 15–500)
Eosinophils Relative: 4 %
HEMATOCRIT: 35.8 % — AB (ref 38.5–50.0)
Hemoglobin: 11.2 g/dL — ABNORMAL LOW (ref 13.2–17.1)
Lymphocytes Relative: 39 %
Lymphs Abs: 1794 cells/uL (ref 850–3900)
MCH: 24.8 pg — ABNORMAL LOW (ref 27.0–33.0)
MCHC: 31.3 g/dL — ABNORMAL LOW (ref 32.0–36.0)
MCV: 79.2 fL — AB (ref 80.0–100.0)
MONO ABS: 552 {cells}/uL (ref 200–950)
MPV: 9.2 fL (ref 7.5–12.5)
Monocytes Relative: 12 %
NEUTROS ABS: 2024 {cells}/uL (ref 1500–7800)
Neutrophils Relative %: 44 %
Platelets: 366 10*3/uL (ref 140–400)
RBC: 4.52 MIL/uL (ref 4.20–5.80)
RDW: 16.8 % — ABNORMAL HIGH (ref 11.0–15.0)
WBC: 4.6 10*3/uL (ref 3.8–10.8)

## 2016-11-02 MED ORDER — CLONIDINE HCL 0.2 MG PO TABS: 0.2000 mg | ORAL_TABLET | Freq: Three times a day (TID) | ORAL | 3 refills | Status: DC

## 2016-11-02 MED ORDER — HYDRALAZINE HCL 100 MG PO TABS: 100.0000 mg | ORAL_TABLET | Freq: Three times a day (TID) | ORAL | 3 refills | Status: DC

## 2016-11-02 MED ORDER — AMLODIPINE BESYLATE 10 MG PO TABS: 10.0000 mg | ORAL_TABLET | Freq: Every day | ORAL | 3 refills | Status: DC

## 2016-11-02 MED ORDER — LABETALOL HCL 300 MG PO TABS: 300.0000 mg | ORAL_TABLET | Freq: Two times a day (BID) | ORAL | 3 refills | Status: DC

## 2016-11-02 MED ORDER — ISOSORBIDE MONONITRATE ER 30 MG PO TB24: 30.0000 mg | ORAL_TABLET | Freq: Every day | ORAL | 3 refills | Status: DC

## 2016-11-02 MED ORDER — CLONIDINE HCL 0.1 MG PO TABS
0.2000 mg | ORAL_TABLET | Freq: Once | ORAL | Status: AC
  Administered 2016-11-02: 0.2 mg via ORAL

## 2016-11-02 MED ORDER — ATORVASTATIN CALCIUM 40 MG PO TABS: 40.0000 mg | ORAL_TABLET | Freq: Every day | ORAL | 3 refills | Status: DC

## 2016-11-02 MED ORDER — FUROSEMIDE 80 MG PO TABS: 80.0000 mg | ORAL_TABLET | Freq: Every day | ORAL | 3 refills | Status: DC

## 2016-11-02 MED ORDER — NICOTINE 14 MG/24HR TD PT24: 14.0000 mg | MEDICATED_PATCH | Freq: Every day | TRANSDERMAL | 0 refills | Status: DC

## 2016-11-02 MED FILL — cloNIDine HCL 0.2 MG TABS: 0.2 | 30 days supply | Qty: 90 | Fill #0

## 2016-11-02 MED FILL — AMLODIPINE BESYLATE 10 MG T: 10 | 30 days supply | Qty: 30 | Fill #0

## 2016-11-02 MED FILL — LABETALOL HCL 300 MG TABLET: 300 | 30 days supply | Qty: 60 | Fill #0

## 2016-11-02 MED FILL — ?ISOSORBIDE MN ER 30 MG TAB: 30 | 30 days supply | Qty: 30 | Fill #0

## 2016-11-02 MED FILL — FUROSEMIDE 80 MG TABLET: 80 | 30 days supply | Qty: 30 | Fill #0

## 2016-11-02 NOTE — Patient Instructions (Signed)
Medication Instructions:  Please discontinue your Asprin.  Continue all other medications as listed.  Follow-Up: Follow up with [name] in 1 month.  Thank you for choosing [facility]!!

## 2016-11-02 NOTE — Progress Notes (Signed)
Stanley [name]  ZTI:[id number]  ASN:[id number]  DOB - [birth year]  Chief Complaint  Patient presents with  . Hospitalization Follow-up       Subjective:   Stanley [name] is a 41 y.o. male here today for establishment of care. He has hypertension and he is a smoker. On 09/26/2016 he presented to the emergency department with increasing shortness of breath, orthopnea, dyspnea, chest pain and headaches. Been out of all medications for at least 2 months due to lack of insurance. His blood pressure was 265/168. His peptide was 2911. Troponin 0.09. Potassium 3.3. Creatinine 3.1. Chest x-ray showed cardiomegaly. He was admitted by the internal medicine team for gentle diuresis. He was started on multiple blood pressure medications. Nephrology was consulted. Renal ultrasound within normal limits. Echo showed LVH with 2 out of 4 diastolic dysfunction and a normal EF. His creatinine at discharge was 3.8.  He has seen the nephrologist in follow-up, Dr. Broadus John. I do not have any recent labs. He again has been out of medication since Sunday. He states that he is still okay. He is ready to go back to work. He continues to smoke.  He has been keeping a blood pressure log and his systolic blood pressures have been from 193-790 and diastolic blood pressures been from 88-114. When he saw the nephrologist recently his hydralazine was increased to 100 mg 3 times a day.  He also is been seen by cardiology as recently as today.  ROS: GEN: denies fever or chills, denies change in weight Skin: denies lesions or rashes HEENT: denies headache, earache, epistaxis, sore throat, or neck pain LUNGS: denies SHOB, dyspnea, PND, orthopnea CV: denies CP or palpitations ABD: denies abd pain, N or V EXT: denies muscle spasms or swelling; no pain in lower ext, no weakness NEURO: denies numbness or tingling, denies sz, stroke or TIA  ALLERGIES: Allergies  Allergen Reactions  . Lactose Intolerance (Gi) Nausea And Vomiting  .  Other Swelling    All beans    PAST MEDICAL HISTORY: Past Medical History:  Diagnosis Date  . Hypertension   . Tobacco abuse     PAST SURGICAL HISTORY: No past surgical history on file.  MEDICATIONS AT HOME: Prior to Admission medications   Medication Sig Start Date End Date Taking? Authorizing Provider  amLODipine (NORVASC) 10 MG tablet Take 1 tablet (10 mg total) by mouth daily. 11/02/16   Eaton Folmar Daneil Dan, PA-C  atorvastatin (LIPITOR) 40 MG tablet Take 1 tablet (40 mg total) by mouth daily at 6 PM. 11/02/16   Brayton Caves, PA-C  cloNIDine (CATAPRES) 0.2 MG tablet Take 1 tablet (0.2 mg total) by mouth every 8 (eight) hours. 11/02/16   Myrikal Messmer Daneil Dan, PA-C  furosemide (LASIX) 80 MG tablet Take 1 tablet (80 mg total) by mouth daily. 11/02/16   Isamar Wellbrock Daneil Dan, PA-C  hydrALAZINE (APRESOLINE) 100 MG tablet Take 1 tablet (100 mg total) by mouth 3 (three) times daily. 11/02/16   Brigido Mera Daneil Dan, PA-C  isosorbide mononitrate (IMDUR) 30 MG 24 hr tablet Take 1 tablet (30 mg total) by mouth daily. 11/02/16   Jerri Hargadon Daneil Dan, PA-C  labetalol (NORMODYNE) 300 MG tablet Take 1 tablet (300 mg total) by mouth 2 (two) times daily. 11/02/16   Domitila Stetler Daneil Dan, PA-C  nicotine (NICODERM CQ - DOSED IN MG/24 HOURS) 14 mg/24hr patch Place 1 patch (14 mg total) onto the skin daily. 11/02/16   Brayton Caves, PA-C     Objective:   Vitals:  11/02/16 1425  BP: (!) 182/110  Pulse: 66  Resp: 16  Temp: 97.9 F (36.6 C)  TempSrc: Oral  SpO2: 100%  Weight: 217 lb 6.4 oz (98.6 kg)    Exam-benign General appearance : Awake, alert, not in any distress. Speech Clear. Not toxic looking HEENT: Atraumatic and Normocephalic, pupils equally reactive to light and accomodation Neck: supple, no JVD. No cervical lymphadenopathy.  Chest:Good air entry bilaterally, no added sounds  CVS: S1 S2 regular, no murmurs.  Abdomen: Bowel sounds present, Non tender and not distended with no gaurding, rigidity or rebound. Extremities:  B/L Lower Ext shows no edema, both legs are warm to touch Neurology: Awake alert, and oriented X 3, CN II-XII intact, Non focal Skin:No Rash Wounds:N/A  Data Review Lab Results  Component Value Date   HGBA1C 4.8 09/27/2016    Assessment & Plan  1. Chronic dHF/HFpEF  -Cont current meds; BP control  -low salt diet  -Cards f/u as scheduled 2. AKI on CKD stage 3  -BMP today   -BP control  -Nephrology f/u as scheduled 3. HTN emergency-improved  -all meds refilled  -DASH diet 4. Smoker  -cessation discussed; pt requests patches 5. HL  -cont statin, refill provided  Development worker, community. Return in about 1 week (around 11/09/2016).  Marzetta Board, PharmD in 1 week; PCP in 2 weeks. Cleared to return to work.   The patient was given clear instructions to go to ER or return to medical center if symptoms don't improve, worsen or new problems develop. The patient verbalized understanding. The patient was told to call to get lab results if they haven't heard anything in the next week.   This note has been created with Surveyor, quantity. Any transcriptional errors are unintentional.    Zettie Pho, PA-C [facility], [place]   11/02/2016, 2:54 PM

## 2016-11-02 NOTE — Progress Notes (Signed)
Cardiology Office Note    Date:  11/02/2016   ID:  Stanley [name], DOB Jan 12, 1976, MRN [id number]  PCP:  No PCP Per Patient  Cardiologist:   Stanley Furbish, MD     History of Present Illness:  Stanley [name] is a 41 y.o. male admitted on 09/26/16 with shortness of breath related to hypertensive emergency with acute kidney injury, nephrology recommended outpatient follow-up and blood pressure management. Mildly elevated troponin noted. Echo demonstrates severe concentric hypertrophy with normal ejection fraction. His blood pressure in the emergency department was 265/168. Smoker. Lives with his brother  Creatinine was 3.9-3.85. Hemoglobin 9.6. Point-of-care troponin was 0.09 but then subsequent draw was 0.07, normal range. 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 He admits that he has not had his medications. He will be seeing Stanley [name] and Stanley [name] this evening.  Currently he denies any shortness of breath, chest pain, neurologic symptoms.  Past Medical History:  Diagnosis Date  . Hypertension   . Tobacco abuse     No past surgical history on file.  Current Medications: Outpatient Medications Prior to Visit  Medication Sig Dispense Refill  . amLODipine (NORVASC) 10 MG tablet Take 1 tablet (10 mg total) by mouth daily. 30 tablet 0  . atorvastatin (LIPITOR) 40 MG tablet Take 1 tablet (40 mg total) by mouth daily at 6 PM. 30 tablet 0  . cloNIDine (CATAPRES) 0.2 MG tablet Take 1 tablet (0.2 mg total) by mouth every 8 (eight) hours. 90 tablet 0  . furosemide (LASIX) 80 MG tablet Take 1 tablet (80 mg total) by mouth daily. 30 tablet 0  . isosorbide mononitrate (IMDUR) 30 MG 24 hr tablet Take 1 tablet (30 mg total) by mouth daily. 30 tablet 0  . labetalol (NORMODYNE) 300 MG tablet Take 1 tablet (300 mg total) by mouth 2 (two) times daily. 60 tablet 0  . aspirin 325 MG EC tablet Take 1 tablet (325 mg total) by mouth daily. 30  tablet 0  . hydrALAZINE (APRESOLINE) 50 MG tablet Take 1 tablet (50 mg total) by mouth every 8 (eight) hours. 90 tablet 0  . Misc Natural Products (PROSTATE CONTROL) CAPS Take 1 capsule by mouth daily.    . Multiple Vitamin (MULTIVITAMIN WITH MINERALS) TABS tablet Take 1 tablet by mouth daily.     No facility-administered medications prior to visit.      Allergies:   Lactose intolerance (gi) and Other   Social History   Social History  . Marital status: Married    Spouse name: N/A  . Number of children: N/A  . Years of education: N/A   Social History Main Topics  . Smoking status: Current Every Day Smoker  . Smokeless tobacco: Never Used  . Alcohol use Yes  . Drug use: No  . Sexual activity: Not Asked   Other Topics Concern  . None   Social History Narrative  . None     Family History:  The patient's family history includes Diabetes type II in his brother; Hypertension in his brother and mother.   ROS:   Please see the history of present illness.    ROS All other systems reviewed and are negative.   PHYSICAL EXAM:   VS:  BP (!) 180/124 (BP Location: Left Arm)   Pulse 70   Ht 6' (1.829 m)   Wt 216 lb 1.9 oz (98 kg)   BMI 29.31 kg/m  GEN: Well nourished, well developed, in no acute distress  HEENT: normal  Neck: no JVD, carotid bruits, or masses Cardiac: RRR; no murmurs, rubs, or gallops,no edema  Respiratory:  clear to auscultation bilaterally, normal work of breathing GI: soft, nontender, nondistended, + BS MS: no deformity or atrophy  Skin: warm and dry, no rash Neuro:  Alert and Oriented x 3, Strength and sensation are intact Psych: euthymic mood, full affect  Wt Readings from Last 3 Encounters:  11/02/16 216 lb 1.9 oz (98 kg)  10/03/16 204 lb 12.8 oz (92.9 kg)  08/24/15 233 lb (105.7 kg)      Studies/Labs Reviewed:   EKG:  09/27/16-sinus rhythm, LVH, nonspecific ST-T wave changes  Recent Labs: 09/26/2016: B Natriuretic Peptide  2,911.0 09/27/2016: TSH 1.233 09/28/2016: Magnesium 2.1 09/29/2016: ALT 11 10/01/2016: Hemoglobin 9.6; Platelets 295 10/03/2016: BUN 42; Creatinine, Ser 3.85; Potassium 3.7; Sodium 138   Lipid Panel    Component Value Date/Time   CHOL 132 09/27/2016 0301   TRIG 68 09/27/2016 0301   HDL 31 (L) 09/27/2016 0301   CHOLHDL 4.3 09/27/2016 0301   VLDL 14 09/27/2016 0301   LDLCALC 87 09/27/2016 0301    Additional studies/ records that were reviewed today include:  ECHO 09/28/16  - There is severe concentric left ventricular hypertrophy with   grade 2 diastolic dysfunction and elevated filling pressures and   severe left atrial enlargement.    ASSESSMENT:    1. LVH (left ventricular hypertrophy)   2. CKD (chronic kidney disease) stage 4, GFR 15-29 ml/min (HCC)   3. Uncontrolled hypertension      PLAN:  In order of problems listed above:  Severe hypertension with recent hypertensive urgency  - Left ventricular hypertrophy noted on echocardiogram  - Antihypertensives noted during hospitalization.  - With creatinine of 3.9, continue close follow-up with nephrology, agree.  - No further cardiology workup necessary at this time.  - One isolated point-of-care troponin was abnormal which certainly could be seen in the setting of this extremely elevated blood pressure. His subsequent troponins were within normal range.  - He did not have low voltage on his ECG personally viewed. Severe concentric left ventricular hypertrophy is secondary to uncontrolled hypertension. I would not suspect amyloidosis as a cause.  - Continue to Encourage follow up with nephrology. Established with primary care, Stanley [name] and Stanley [name].  - I would like to see his blood pressure under better control before proceeding with heavy vigorous work.  Chronic kidney disease stage IV  - Hypertensive etiology  - Avoiding ACE inhibitor is, ARB, spironolactone because of this.  Family history of hypertension  -  Aneurysm in family, hypertension etc.  Ultimately, lengthy discussion about lifestyle modification, changes. He has been eating better, no salt, drinking water. I explained to him the dangers of continued excessive hypertension in relation to heart failure, systolic dysfunction in the future, need for hemodialysis.  One month follow up, APP   Medication Adjustments/Labs and Tests Ordered: Current medicines are reviewed at length with the patient today.  Concerns regarding medicines are outlined above.  Medication changes, Labs and Tests ordered today are listed in the Patient Instructions below. Patient Instructions  Medication Instructions:  Please discontinue your Asprin.  Continue all other medications as listed.  Follow-Up: Follow up with Stanley [name] in 1 month.  Thank you for choosing [facility]!!        Signed, Stanley Furbish, MD  11/02/2016 9:12 AM    [facility]  [address], [place], [place]  [address] Phone: [phone]; Fax: [phone]

## 2016-11-02 NOTE — Patient Instructions (Signed)
Please work on your smoking Please cont to keep a log of your blood pressure and bring in to all appointments

## 2016-11-03 LAB — BASIC METABOLIC PANEL
BUN: 36 mg/dL — AB (ref 7–25)
CHLORIDE: 103 mmol/L (ref 98–110)
CO2: 28 mmol/L (ref 20–31)
CREATININE: 3.8 mg/dL — AB (ref 0.60–1.35)
Calcium: 9.6 mg/dL (ref 8.6–10.3)
Glucose, Bld: 87 mg/dL (ref 65–99)
Potassium: 4.3 mmol/L (ref 3.5–5.3)
Sodium: 141 mmol/L (ref 135–146)

## 2016-11-08 ENCOUNTER — Inpatient Hospital Stay: Payer: Self-pay | Admitting: Family Medicine

## 2016-11-08 MED FILL — ATORVASTATIN 40 MG TABLET: 40 | 30 days supply | Qty: 30 | Fill #0

## 2016-11-10 ENCOUNTER — Encounter: Payer: Self-pay | Admitting: Pharmacist

## 2016-11-10 ENCOUNTER — Ambulatory Visit: Payer: Self-pay | Attending: Internal Medicine | Admitting: Pharmacist

## 2016-11-10 VITALS — BP 181/95 | HR 73

## 2016-11-10 DIAGNOSIS — I509 Heart failure, unspecified: Secondary | ICD-10-CM | POA: Insufficient documentation

## 2016-11-10 DIAGNOSIS — Z79899 Other long term (current) drug therapy: Secondary | ICD-10-CM | POA: Insufficient documentation

## 2016-11-10 DIAGNOSIS — I110 Hypertensive heart disease with heart failure: Secondary | ICD-10-CM | POA: Insufficient documentation

## 2016-11-10 DIAGNOSIS — I10 Essential (primary) hypertension: Secondary | ICD-10-CM

## 2016-11-10 NOTE — Patient Instructions (Addendum)
Get PCP at [facility] in 1-2 weeks   DASH Eating Plan DASH stands for "Dietary Approaches to Stop Hypertension." The DASH eating plan is a healthy eating plan that has been shown to reduce high blood pressure (hypertension). Additional health benefits may include reducing the risk of type 2 diabetes mellitus, heart disease, and stroke. The DASH eating plan may also help with weight loss. What do I need to know about the DASH eating plan? For the DASH eating plan, you will follow these general guidelines:  Choose foods with less than 150 milligrams of sodium per serving (as listed on the food label).  Use salt-free seasonings or herbs instead of table salt or sea salt.  Check with your health care provider or pharmacist before using salt substitutes.  Eat lower-sodium products. These are often labeled as "low-sodium" or "no salt added."  Eat fresh foods. Avoid eating a lot of canned foods.  Eat more vegetables, fruits, and low-fat dairy products.  Choose whole grains. Look for the word "whole" as the first word in the ingredient list.  Choose fish and skinless chicken or [place] more often than red meat. Limit fish, poultry, and meat to 6 oz (170 g) each day.  Limit sweets, desserts, sugars, and sugary drinks.  Choose heart-healthy fats.  Eat more home-cooked food and less restaurant, buffet, and fast food.  Limit fried foods.  Do not fry foods. Cook foods using methods such as baking, boiling, grilling, and broiling instead.  When eating at a restaurant, ask that your food be prepared with less salt, or no salt if possible. What foods can I eat? Seek help from a dietitian for individual calorie needs. Grains  Whole grain or whole wheat bread. Brown rice. Whole grain or whole wheat pasta. Quinoa, bulgur, and whole grain cereals. Low-sodium cereals. Corn or whole wheat flour tortillas. Whole grain cornbread. Whole grain crackers. Low-sodium crackers. Vegetables  Fresh or frozen  vegetables (raw, steamed, roasted, or grilled). Low-sodium or reduced-sodium tomato and vegetable juices. Low-sodium or reduced-sodium tomato sauce and paste. Low-sodium or reduced-sodium canned vegetables. Fruits  All fresh, canned (in natural juice), or frozen fruits. Meat and Other Protein Products  Ground beef (85% or leaner), grass-fed beef, or beef trimmed of fat. Skinless chicken or [place]. Ground chicken or [place]. Pork trimmed of fat. All fish and seafood. Eggs. Dried beans, peas, or lentils. Unsalted nuts and seeds. Unsalted canned beans. Dairy  Low-fat dairy products, such as skim or 1% milk, 2% or reduced-fat cheeses, low-fat ricotta or cottage cheese, or plain low-fat yogurt. Low-sodium or reduced-sodium cheeses. Fats and Oils  Tub margarines without trans fats. Light or reduced-fat mayonnaise and salad dressings (reduced sodium). Avocado. Safflower, olive, or canola oils. Natural peanut or almond butter. Other  Unsalted popcorn and pretzels. The items listed above may not be a complete list of recommended foods or beverages. Contact your dietitian for more options.  What foods are not recommended? Grains  White bread. White pasta. White rice. Refined cornbread. Bagels and croissants. Crackers that contain trans fat. Vegetables  Creamed or fried vegetables. Vegetables in a cheese sauce. Regular canned vegetables. Regular canned tomato sauce and paste. Regular tomato and vegetable juices. Fruits  Canned fruit in light or heavy syrup. Fruit juice. Meat and Other Protein Products  Fatty cuts of meat. Ribs, chicken wings, bacon, sausage, bologna, salami, chitterlings, fatback, hot dogs, bratwurst, and packaged luncheon meats. Salted nuts and seeds. Canned beans with salt. Dairy  Whole or 2% milk, cream, half-and-half,  and cream cheese. Whole-fat or sweetened yogurt. Full-fat cheeses or blue cheese. Nondairy creamers and whipped toppings. Processed cheese, cheese spreads, or cheese  curds. Condiments  Onion and garlic salt, seasoned salt, table salt, and sea salt. Canned and packaged gravies. Worcestershire sauce. Tartar sauce. Barbecue sauce. Teriyaki sauce. Soy sauce, including reduced sodium. Steak sauce. Fish sauce. Oyster sauce. Cocktail sauce. Horseradish. Ketchup and mustard. Meat flavorings and tenderizers. Bouillon cubes. Hot sauce. Tabasco sauce. Marinades. Taco seasonings. Relishes. Fats and Oils  Butter, stick margarine, lard, shortening, ghee, and bacon fat. Coconut, palm kernel, or palm oils. Regular salad dressings. Other  Pickles and olives. Salted popcorn and pretzels. The items listed above may not be a complete list of foods and beverages to avoid. Contact your dietitian for more information.  Where can I find more information? National Heart, Lung, and Blood Institute: travelstabloid.com This information is not intended to replace advice given to you by your health care provider. Make sure you discuss any questions you have with your health care provider. Document Released: 09/08/2011 Document Revised: 02/25/2016 Document Reviewed: 07/24/2013 Elsevier Interactive Patient Education  2017 [employer].

## 2016-11-10 NOTE — Progress Notes (Signed)
   S:    Patient arrives in good spirits.    Presents to the clinic for hypertension evaluation. Patient was referred on 11/02/16 by Zettie Pho, PA-C/Dr. Doreene Burke.  Patient reports adherence with medications but per his pill box, he missed a dose on Tuesday. Also he doesn't have the second dose of the labetalol in his pill box so he is only taking it once a day.  Current BP Medications include:  Amlodipine 10 mg daily, clonidine 0.2 mg TID, hydralazine 100 mg TID, labetalol 300 mg BID  Antihypertensives tried in the past include: benazepril, metoprolol tartrate, lisinopril (can't take ACEi/ARB/aldosterone antagonist due to kidney function)  Patient was recently discharged from hospital and all medications have been reviewed.  O:   Last 3 Office BP readings: BP Readings from Last 3 Encounters:  11/10/16 (!) 181/95  11/02/16 (!) 182/110  11/02/16 (!) 180/124    BMET    Component Value Date/Time   NA 141 11/02/2016 1450   K 4.3 11/02/2016 1450   CL 103 11/02/2016 1450   CO2 28 11/02/2016 1450   GLUCOSE 87 11/02/2016 1450   BUN 36 (H) 11/02/2016 1450   CREATININE 3.80 (H) 11/02/2016 1450   CALCIUM 9.6 11/02/2016 1450   GFRNONAA 18 (L) 10/03/2016 0243   GFRAA 21 (L) 10/03/2016 0243    A/P: Hypertension longstanding currently UNcontrolled on current medications. Continued all medications as prescribed and instructed patient to put the second dose of labetalol in his pill box. This is a very difficult patient to manage. We cannot use ACEI/ARB/spironolactone until his kidneys recover or stabilize. He has heart failure so alpha 1 blockers are not ideal because they could worsen it. We cannot increase the dose of the clonidine right not due to sedation. HCTZ is not viable due to kidney function. Focused on conversation on the DASH diet and eliminating all forms of it.   Will not administer clonidine in clinic, he is due for his second dose of the day as soon as he leaves and he would  prefer to take it at home. If SCr stable at next reading, would consider ACEi/ARB.  Results reviewed and written information provided.   Total time in face-to-face counseling 30 minutes.   F/U Clinic Visit with new primary care provider.  Patient seen with Windell Moulding, PharmD Candidate

## 2016-11-15 ENCOUNTER — Encounter: Payer: Self-pay | Admitting: Physician Assistant

## 2016-11-15 ENCOUNTER — Ambulatory Visit (HOSPITAL_COMMUNITY)
Admission: RE | Admit: 2016-11-15 | Discharge: 2016-11-15 | Disposition: A | Discharge status: Home / Self Care | Payer: Self-pay | Source: Ambulatory Visit | Attending: Physician Assistant | Admitting: Physician Assistant

## 2016-11-15 ENCOUNTER — Ambulatory Visit: Payer: Self-pay | Attending: Physician Assistant | Admitting: Physician Assistant

## 2016-11-15 VITALS — BP 170/90 | HR 70 | Temp 98.2°F | Resp 20 | Wt 222.0 lb

## 2016-11-15 DIAGNOSIS — R05 Cough: Secondary | ICD-10-CM | POA: Insufficient documentation

## 2016-11-15 DIAGNOSIS — Z79899 Other long term (current) drug therapy: Secondary | ICD-10-CM | POA: Insufficient documentation

## 2016-11-15 DIAGNOSIS — N189 Chronic kidney disease, unspecified: Secondary | ICD-10-CM | POA: Insufficient documentation

## 2016-11-15 DIAGNOSIS — I10 Essential (primary) hypertension: Secondary | ICD-10-CM

## 2016-11-15 DIAGNOSIS — I129 Hypertensive chronic kidney disease with stage 1 through stage 4 chronic kidney disease, or unspecified chronic kidney disease: Secondary | ICD-10-CM | POA: Insufficient documentation

## 2016-11-15 DIAGNOSIS — R059 Cough, unspecified: Secondary | ICD-10-CM

## 2016-11-15 DIAGNOSIS — R918 Other nonspecific abnormal finding of lung field: Secondary | ICD-10-CM | POA: Insufficient documentation

## 2016-11-15 MED ORDER — AMOXICILLIN-POT CLAVULANATE 875-125 MG PO TABS: 1.0000 | ORAL_TABLET | Freq: Two times a day (BID) | ORAL | 0 refills | Status: AC

## 2016-11-15 MED ORDER — GUAIFENESIN ER 1200 MG PO TB12: 1.0000 | ORAL_TABLET | Freq: Two times a day (BID) | ORAL | 0 refills | Status: AC

## 2016-11-15 MED ORDER — BENZONATATE 100 MG PO CAPS: 100.0000 mg | ORAL_CAPSULE | Freq: Two times a day (BID) | ORAL | 0 refills | Status: DC | PRN

## 2016-11-15 MED FILL — BENZONATATE 100 MG CAPSULE: 100 | 10 days supply | Qty: 20 | Fill #0

## 2016-11-15 MED FILL — AMOX-CLAV 875-125 MG TABLET: 875-125 | 10 days supply | Qty: 20 | Fill #0

## 2016-11-15 NOTE — Progress Notes (Signed)
Subjective:  Patient ID: Stanley [name], male    DOB: 1976/01/13  Age: 41 y.o. MRN: [id number]  CC: Cough  HPI Stanley [name] is a 41 y.o. male with a PMH of HTN and CKD presents with a 2-1/2 week history of cough, congestion, fever, fatigue, and malaise. His brother is sick with similar symptoms. Has not taken any over-the-counter remedies for relief due to fear of exacerbating his CKD. Denies chest pain, shortness of breath, headache, abdominal pain, nausea, vomiting, rash, or GI/GU symptoms.    Outpatient Medications Prior to Visit  Medication Sig Dispense Refill  . amLODipine (NORVASC) 10 MG tablet Take 1 tablet (10 mg total) by mouth daily. 90 tablet 3  . atorvastatin (LIPITOR) 40 MG tablet Take 1 tablet (40 mg total) by mouth daily at 6 PM. 30 tablet 3  . cloNIDine (CATAPRES) 0.2 MG tablet Take 1 tablet (0.2 mg total) by mouth every 8 (eight) hours. 90 tablet 3  . furosemide (LASIX) 80 MG tablet Take 1 tablet (80 mg total) by mouth daily. 30 tablet 3  . hydrALAZINE (APRESOLINE) 100 MG tablet Take 1 tablet (100 mg total) by mouth 3 (three) times daily. 90 tablet 3  . isosorbide mononitrate (IMDUR) 30 MG 24 hr tablet Take 1 tablet (30 mg total) by mouth daily. 30 tablet 3  . labetalol (NORMODYNE) 300 MG tablet Take 1 tablet (300 mg total) by mouth 2 (two) times daily. 60 tablet 3  . nicotine (NICODERM CQ - DOSED IN MG/24 HOURS) 14 mg/24hr patch Place 1 patch (14 mg total) onto the skin daily. 28 patch 0   No facility-administered medications prior to visit.      ROS Review of Systems  Constitutional: Positive for fever and malaise/fatigue. Negative for chills.  HENT: Negative for ear pain and sore throat.   Eyes: Negative for pain.  Respiratory: Positive for cough. Negative for shortness of breath.   Cardiovascular: Negative for chest pain.  Gastrointestinal: Negative for nausea and vomiting.  Musculoskeletal: Negative for myalgias.  Skin: Negative for rash.  Neurological:  Negative for headaches.    Objective:  BP (!) 170/90 (BP Location: Left Arm, Patient Position: Sitting, Cuff Size: Large)   Pulse 70   Temp 98.2 F (36.8 C) (Oral)   Resp 20   Wt 222 lb (100.7 kg)   SpO2 97%   BMI 30.11 kg/m   BP/Weight 11/15/2016 11/10/2016 03/28/9484  Systolic BP 462 703 500  Diastolic BP 90 95 938  Wt. (Lbs) 222 - 217.4  BMI 30.11 - 29.48      Physical Exam  Constitutional: He is oriented to person, place, and time.  Mildly ill appearing, NAD, occasional cough, normal body habitus, polite  HENT:  Head: Normocephalic and atraumatic.  Turbinates are mildly hypertrophic and moderately erythematous with rhinorrhea. TMs normal. Oropharynx mildly erythematous with no exudate  Eyes: No scleral icterus.  Neck: No thyromegaly present.  Cardiovascular: Normal rate, regular rhythm and normal heart sounds.   Pulmonary/Chest: Effort normal.  Mild rhonchi noted diffusely bilaterally. No Rales and no wheezes  Musculoskeletal: He exhibits no edema.  Lymphadenopathy:    He has no cervical adenopathy.  Neurological: He is alert and oriented to person, place, and time.  Skin: Skin is warm and dry. No rash noted.  Psychiatric: He has a normal mood and affect. His behavior is normal. Thought content normal.     Assessment & Plan:   1. Cough -CXR  2. HTN -Unable to ascertain how  much cough/illness is contributing to the elevation in blood pressure. He is advised to take medications as prescribed and return in approximately two weeks for reassessment of HTN.  Meds ordered this encounter  Medications  . amoxicillin-clavulanate (AUGMENTIN) 875-125 MG tablet    Sig: Take 1 tablet by mouth 2 (two) times daily.    Dispense:  20 tablet    Refill:  0    Order Specific Question:   Supervising Provider    Answer:   Tresa Garter [id number]  . benzonatate (TESSALON) 100 MG capsule    Sig: Take 1 capsule (100 mg total) by mouth 2 (two) times daily as needed for cough.      Dispense:  20 capsule    Refill:  0    Order Specific Question:   Supervising Provider    Answer:   Tresa Garter [id number]  . Guaifenesin (MUCINEX MAXIMUM STRENGTH) 1200 MG TB12    Sig: Take 1 tablet (1,200 mg total) by mouth 2 (two) times daily.    Dispense:  10 tablet    Refill:  0    Order Specific Question:   Supervising Provider    Answer:   Tresa Garter [id number]    Follow-up: Return in about 2 weeks (around 11/29/2016) for HTN.   Clent Demark PA

## 2016-11-15 NOTE — Patient Instructions (Addendum)
Your blood pressure is elevated today but it is difficult to say how much your illness and cough is contributing to the elevation. Please return in two weeks and take your anti-hypertensives as directed.   Cough, Adult Coughing is a reflex that clears your throat and your airways. Coughing helps to heal and protect your lungs. It is normal to cough occasionally, but a cough that happens with other symptoms or lasts a long time may be a sign of a condition that needs treatment. A cough may last only 2-3 weeks (acute), or it may last longer than 8 weeks (chronic). What are the causes? Coughing is commonly caused by:  Breathing in substances that irritate your lungs.  A viral or bacterial respiratory infection.  Allergies.  Asthma.  Postnasal drip.  Smoking.  Acid backing up from the stomach into the esophagus (gastroesophageal reflux).  Certain medicines.  Chronic lung problems, including COPD (or rarely, lung cancer).  Other medical conditions such as heart failure. Follow these instructions at home: Pay attention to any changes in your symptoms. Take these actions to help with your discomfort:  Take medicines only as told by your health care provider.  If you were prescribed an antibiotic medicine, take it as told by your health care provider. Do not stop taking the antibiotic even if you start to feel better.  Talk with your health care provider before you take a cough suppressant medicine.  Drink enough fluid to keep your urine clear or pale yellow.  If the air is dry, use a cold steam vaporizer or humidifier in your bedroom or your home to help loosen secretions.  Avoid anything that causes you to cough at work or at home.  If your cough is worse at night, try sleeping in a semi-upright position.  Avoid cigarette smoke. If you smoke, quit smoking. If you need help quitting, ask your health care provider.  Avoid caffeine.  Avoid alcohol.  Rest as needed. Contact a  health care provider if:  You have new symptoms.  You cough up pus.  Your cough does not get better after 2-3 weeks, or your cough gets worse.  You cannot control your cough with suppressant medicines and you are losing sleep.  You develop pain that is getting worse or pain that is not controlled with pain medicines.  You have a fever.  You have unexplained weight loss.  You have night sweats. Get help right away if:  You cough up blood.  You have difficulty breathing.  Your heartbeat is very fast. This information is not intended to replace advice given to you by your health care provider. Make sure you discuss any questions you have with your health care provider. Document Released: 03/18/2011 Document Revised: 02/25/2016 Document Reviewed: 11/26/2014 Elsevier Interactive Patient Education  2017 [employer].

## 2016-11-21 ENCOUNTER — Ambulatory Visit: Payer: Self-pay | Admitting: Family Medicine

## 2016-11-25 NOTE — Progress Notes (Signed)
Cardiology Office Note    Date:  11/30/2016   ID:  Stanley [name], DOB 1976-01-19, MRN [id number]  PCP:  No PCP Per Patient  Cardiologist: Dr. Marlou Porch  CC: follow up  History of Present Illness:  Stanley [name] is a 41 y.o. male with a history of tobacco abuse, HTN, severe LVH, CKD stage 4, and medical non compliance who presents to clinic for follow up.   He was admitted in 09/2016 with shortness of breath related to hypertensive emergency with acute kidney injury. His blood pressure in the emergency department was 265/168. Nephrology recommended outpatient follow-up and blood pressure management. He had mildly elevated troponin c/w demand ischemia. Renal ultrasound within normal limits. Echo demonstrated severe concentric hypertrophy with normal ejection fraction and G2DD. Of note, there was concern for cardiac amyloidosis but Dr. Marlou Porch thought that this was very unlikely and more c/w severe uncontrolled HTN.  He was seen by Dr. Marlou Porch in the office on 11/02/16 for follow up. They had a long discussion about the importance of BP control and compliance given his CKD and severe LVH felt directly related to uncontrolled HTN. Plan was for low salt diet and compliance with cardia meds. 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   Current BP Medications include:  Amlodipine 10 mg daily, clonidine 0.2 mg TID, hydralazine 100 mg TID, labetalol 300 mg BID, imdur 30mg  daily, lasix 80mg  daily  Antihypertensives tried in the past include: benazepril, metoprolol tartrate, lisinopril (can't take ACEi/ARB/aldosterone antagonist due to kidney function)   Today he presents to clinic for follow up. He went back to work and it didn't go very well. He felt very SOB. No CP. He does a lot manual labor restoring foreclosed houses. No CP. No LE edema, orthopnea or PND. No dizziness or syncope. No blood in stool or urine. No palpitations.     He is  followed by Dr. Burman Foster.    Past Medical History:  Diagnosis Date  . Hypertension   . Tobacco abuse     No past surgical history on file.  Current Medications: Outpatient Medications Prior to Visit  Medication Sig Dispense Refill  . amLODipine (NORVASC) 10 MG tablet Take 1 tablet (10 mg total) by mouth daily. 90 tablet 3  . atorvastatin (LIPITOR) 40 MG tablet Take 1 tablet (40 mg total) by mouth daily at 6 PM. 30 tablet 3  . benzonatate (TESSALON) 100 MG capsule Take 1 capsule (100 mg total) by mouth 2 (two) times daily as needed for cough. 20 capsule 0  . cloNIDine (CATAPRES) 0.2 MG tablet Take 1 tablet (0.2 mg total) by mouth every 8 (eight) hours. 90 tablet 3  . furosemide (LASIX) 80 MG tablet Take 1 tablet (80 mg total) by mouth daily. 30 tablet 3  . hydrALAZINE (APRESOLINE) 100 MG tablet Take 1 tablet (100 mg total) by mouth 3 (three) times daily. 90 tablet 3  . nicotine (NICODERM CQ - DOSED IN MG/24 HOURS) 14 mg/24hr patch Place 1 patch (14 mg total) onto the skin daily. 28 patch 0  . isosorbide mononitrate (IMDUR) 30 MG 24 hr tablet Take 1 tablet (30 mg total) by mouth daily. 30 tablet 3  . labetalol (NORMODYNE) 300 MG tablet Take 1 tablet (300 mg total) by mouth 2 (two) times daily. 60 tablet 3   No facility-administered medications prior to visit.      Allergies:   Lactose intolerance (gi);  Other; and Nitroglycerin   Social History   Social History  . Marital status: Married    Spouse name: N/A  . Number of children: N/A  . Years of education: N/A   Social History Main Topics  . Smoking status: Current Every Day Smoker  . Smokeless tobacco: Never Used  . Alcohol use Yes  . Drug use: No  . Sexual activity: Not Asked   Other Topics Concern  . None   Social History Narrative  . None     Family History:  The patient's family history includes Diabetes type II in his brother; Hypertension in his brother and mother.     VS:  BP (!) 186/100 (BP Location: Left  Arm)   Pulse 72   Ht 6' (1.829 m)   Wt 221 lb 6.4 oz (100.4 kg)   SpO2 97%   BMI 30.03 kg/m    GEN: Well nourished, well developed, in no acute distress  HEENT: normal  Neck: no JVD, carotid bruits, or masses Cardiac: RRR; no murmurs, rubs, or gallops,no edema  Respiratory:  clear to auscultation bilaterally, normal work of breathing GI: soft, nontender, nondistended, + BS MS: no deformity or atrophy  Skin: warm and dry, no rash Neuro:  Alert and Oriented x 3, Strength and sensation are intact Psych: euthymic mood, full affect   Wt Readings from Last 3 Encounters:  11/30/16 221 lb 6.4 oz (100.4 kg)  11/15/16 222 lb (100.7 kg)  11/02/16 217 lb 6.4 oz (98.6 kg)      Studies/Labs Reviewed:   EKG:  EKG is NOT ordered today.    Recent Labs: 09/26/2016: B Natriuretic Peptide 2,911.0 09/27/2016: TSH 1.233 09/28/2016: Magnesium 2.1 09/29/2016: ALT 11 11/02/2016: BUN 36; Creat 3.80; Hemoglobin 11.2; Platelets 366; Potassium 4.3; Sodium 141   Lipid Panel    Component Value Date/Time   CHOL 132 09/27/2016 0301   TRIG 68 09/27/2016 0301   HDL 31 (L) 09/27/2016 0301   CHOLHDL 4.3 09/27/2016 0301   VLDL 14 09/27/2016 0301   LDLCALC 87 09/27/2016 0301    Additional studies/ records that were reviewed today include:  2D ECHO: 09/28/2016 LV EF: 55% -   60% Study Conclusions - Left ventricle: The cavity size was mildly dilated. There was   severe concentric hypertrophy. Systolic function was normal. The   estimated ejection fraction was in the range of 55% to 60%. Wall   motion was normal; there were no regional wall motion   abnormalities. Features are consistent with a pseudonormal left   ventricular filling pattern, with concomitant abnormal relaxation   and increased filling pressure (grade 2 diastolic dysfunction).   Doppler parameters are consistent with elevated ventricular   end-diastolic filling pressure. - Aortic valve: There was mild regurgitation. - Aortic root:  The aortic root was normal in size. - Ascending aorta: The ascending aorta was normal in size. - Left atrium: The atrium was moderately to severely dilated. - Right ventricle: Systolic function was normal. - Tricuspid valve: There was mild regurgitation. - Pulmonary arteries: Systolic pressure was within the normal   range. - Inferior vena cava: The vessel was normal in size. - Pericardium, extracardiac: There was no pericardial effusion. Impressions: - There is severe concentric left ventricular hypertrophy with   grade 2 diastolic dysfunction and elevated filling pressures and   severe left atrial enlargement.   These findings together with low voltage ECG are highly   suspicious of cardiac amyloidosis.   Further evaluation with cardiac MRI  and abdominal fat biopsy is   recommended.  ASSESSMENT & PLAN:   Severe HTN with LVH: BP elevated today.  Review of log reveals very poor control with average BP ~ 190/110. Complaint with Amlodipine 10 mg daily, clonidine 0.2 mg TID, hydralazine 100 mg TID, labetalol 300 mg BID, imdur 30mg  daily, lasix 80mg  daily. Will proceed with w/u for secondary HTN including renal artery duplex, urine metanephrines/catecholamines, 24-hour urine protein, ACEi level, renin/aldosterone ratios, CMET, thyroid function, sleep study, and 24 hour cortisol. In the meantime will increase labetalol to 400mg  BID and increase imdur from 30mg  to 60mg  daily. I will have him seen in the BP clinic in 2 weeks. Discussed with Dr. Rayann Heman who suggested asking Dr. Rhett Bannister if we could possibly add an ACEi. I will route my note to him to ask.   CKD stage IV: baseline creat ~3.8. Followed by Dr. Burman Foster.   Tobacco abuse: down to 3 cigs a day. Wants a nicotine patch, which I will prescribe.    Total time spent with patient was over 40 minutes which included evaluating patient, reviewing record and coordinating care. Face to face time >50%. I discussed case with Dr. Rayann Heman ( the doc of  the day) and had a long discussion with the patient. Several diagnostic studies were ordered. We also had to discuss proper lab orders with Dr. Radford Pax.    Medication Adjustments/Labs and Tests Ordered: Current medicines are reviewed at length with the patient today.  Concerns regarding medicines are outlined above.  Medication changes, Labs and Tests ordered today are listed in the Patient Instructions below. Patient Instructions  Medication Instructions:  Your physician has recommended you make the following change in your medication:  1.  INCREASE the Labetalol to 200 mg taking 2 tablets twice a day 2.  INCREASE the Imdur to 60 mg taking 1 tablet daily  Labwork: TODAY:   CMET, TSH, 24 HOUR URINE, RENIN ACTIVITY,   Testing/Procedures: Your physician has requested that you have a renal artery duplex. During this test, an ultrasound is used to evaluate blood flow to the kidneys. Allow one hour for this exam. Do not eat after midnight the day before and avoid carbonated beverages. Take your medications as you usually do.   Follow-Up: Your physician recommends that you schedule a follow-up appointment in: 2 [place]  Any Other Special Instructions Will Be Listed Below (If Applicable).      Signed, Angelena Form, PA-C  11/30/2016 10:22 AM    [facility] [address], [place], [place]  [address] Phone: [phone]; Fax: [phone]

## 2016-11-30 ENCOUNTER — Ambulatory Visit (INDEPENDENT_AMBULATORY_CARE_PROVIDER_SITE_OTHER): Payer: Self-pay | Admitting: Physician Assistant

## 2016-11-30 ENCOUNTER — Encounter (INDEPENDENT_AMBULATORY_CARE_PROVIDER_SITE_OTHER): Payer: Self-pay

## 2016-11-30 ENCOUNTER — Encounter: Payer: Self-pay | Admitting: Physician Assistant

## 2016-11-30 VITALS — BP 186/100 | HR 72 | Ht 72.0 in | Wt 221.4 lb

## 2016-11-30 DIAGNOSIS — I10 Essential (primary) hypertension: Secondary | ICD-10-CM

## 2016-11-30 DIAGNOSIS — N189 Chronic kidney disease, unspecified: Secondary | ICD-10-CM

## 2016-11-30 DIAGNOSIS — I517 Cardiomegaly: Secondary | ICD-10-CM

## 2016-11-30 DIAGNOSIS — Z720 Tobacco use: Secondary | ICD-10-CM

## 2016-11-30 MED ORDER — LABETALOL HCL 200 MG PO TABS: 400.0000 mg | ORAL_TABLET | Freq: Two times a day (BID) | ORAL | 3 refills | Status: DC

## 2016-11-30 MED ORDER — ISOSORBIDE MONONITRATE ER 60 MG PO TB24: 60.0000 mg | ORAL_TABLET | Freq: Every day | ORAL | 1 refills | Status: DC

## 2016-11-30 MED FILL — ISOSORBIDE MN ER 60 MG TAB: 60 | 30 days supply | Qty: 30 | Fill #0

## 2016-11-30 MED FILL — LABETALOL HCL 200 MG TABLET: 200 | 30 days supply | Qty: 120 | Fill #0

## 2016-11-30 NOTE — Patient Instructions (Addendum)
Medication Instructions:  Your physician has recommended you make the following change in your medication:  1.  INCREASE the Labetalol to 200 mg taking 2 tablets twice a day 2.  INCREASE the Imdur to 60 mg taking 1 tablet daily  Labwork: TODAY:   CMET, TSH, 24 HOUR URINE, RENIN ACTIVITY,   Testing/Procedures: Your physician has requested that you have a renal artery duplex. During this test, an ultrasound is used to evaluate blood flow to the kidneys. Allow one hour for this exam. Do not eat after midnight the day before and avoid carbonated beverages. Take your medications as you usually do.   Follow-Up: Your physician recommends that you schedule a follow-up appointment in: 2 [place]  Any Other Special Instructions Will Be Listed Below (If Applicable).

## 2016-12-01 MED FILL — AMLODIPINE BESYLATE 10 MG T: 10 | 30 days supply | Qty: 30 | Fill #1

## 2016-12-01 MED FILL — hydrALAZINE HCL 100 MG TABS: 100 | 30 days supply | Qty: 90 | Fill #1

## 2016-12-01 MED FILL — cloNIDine HCL 0.2 MG TABS: 0.2 | 30 days supply | Qty: 90 | Fill #1

## 2016-12-01 MED FILL — FUROSEMIDE 80 MG TABLET: 80 | 30 days supply | Qty: 30 | Fill #1

## 2016-12-01 MED FILL — ATORVASTATIN 40 MG TABLET: 40 | 30 days supply | Qty: 30 | Fill #1

## 2016-12-03 LAB — ALDOSTERONE + RENIN ACTIVITY W/ RATIO
ALDOS/RENIN RATIO: 5.4 (ref 0.0–30.0)
ALDOSTERONE: 17.4 ng/dL (ref 0.0–30.0)
Renin: 3.25 ng/mL/hr (ref 0.167–5.380)

## 2016-12-03 LAB — COMPREHENSIVE METABOLIC PANEL
ALBUMIN: 4.6 g/dL (ref 3.5–5.5)
ALT: 13 IU/L (ref 0–44)
AST: 18 IU/L (ref 0–40)
Albumin/Globulin Ratio: 1.6 (ref 1.2–2.2)
Alkaline Phosphatase: 100 IU/L (ref 39–117)
BILIRUBIN TOTAL: 0.4 mg/dL (ref 0.0–1.2)
BUN/Creatinine Ratio: 9 (ref 9–20)
BUN: 30 mg/dL — AB (ref 6–20)
CALCIUM: 9.5 mg/dL (ref 8.7–10.2)
CO2: 26 mmol/L (ref 18–29)
CREATININE: 3.44 mg/dL — AB (ref 0.76–1.27)
Chloride: 95 mmol/L — ABNORMAL LOW (ref 96–106)
GFR, EST AFRICAN AMERICAN: 25 mL/min/{1.73_m2} — AB (ref >59)
GFR, EST NON AFRICAN AMERICAN: 21 mL/min/{1.73_m2} — AB (ref >59)
GLUCOSE: 93 mg/dL (ref 65–99)
Globulin, Total: 2.9 g/dL (ref 1.5–4.5)
Potassium: 4 mmol/L (ref 3.5–5.2)
Sodium: 141 mmol/L (ref 134–144)
TOTAL PROTEIN: 7.5 g/dL (ref 6.0–8.5)

## 2016-12-03 LAB — TSH: TSH: 1.22 u[IU]/mL (ref 0.450–4.500)

## 2016-12-03 LAB — ANGIOTENSIN CONVERTING ENZYME: ANGIO CONVERT ENZYME: 38 U/L (ref 14–82)

## 2016-12-13 ENCOUNTER — Ambulatory Visit: Payer: Self-pay

## 2016-12-22 ENCOUNTER — Inpatient Hospital Stay (HOSPITAL_COMMUNITY): Admission: RE | Admit: 2016-12-22 | Payer: Self-pay | Source: Ambulatory Visit

## 2016-12-26 ENCOUNTER — Encounter (HOSPITAL_COMMUNITY): Payer: Self-pay | Admitting: Physician Assistant

## 2017-01-05 IMAGING — US US RENAL
1 series · 14 of 25 positions shown · non-contrast
Comparison: None.

CLINICAL DATA: Acute kidney injury.

EXAM:
RENAL / URINARY TRACT ULTRASOUND COMPLETE

[Series 1: us renal · 0.23mm/px · 14 of 36 slices shown]
[im 1/36]
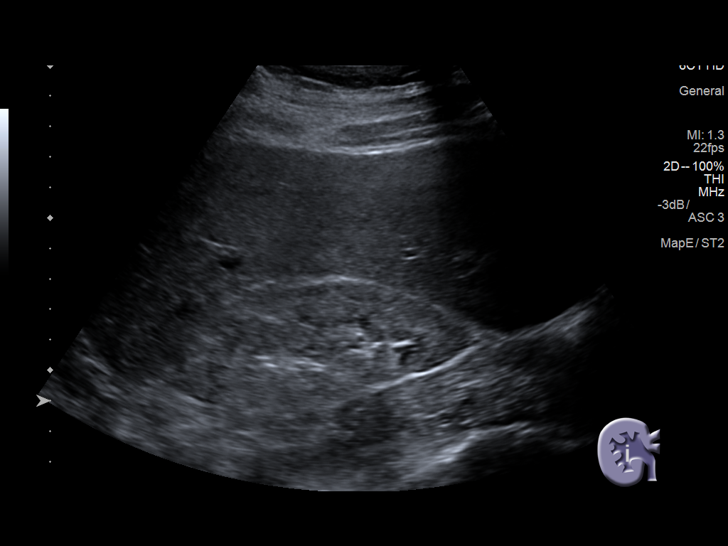
[im 3/36]
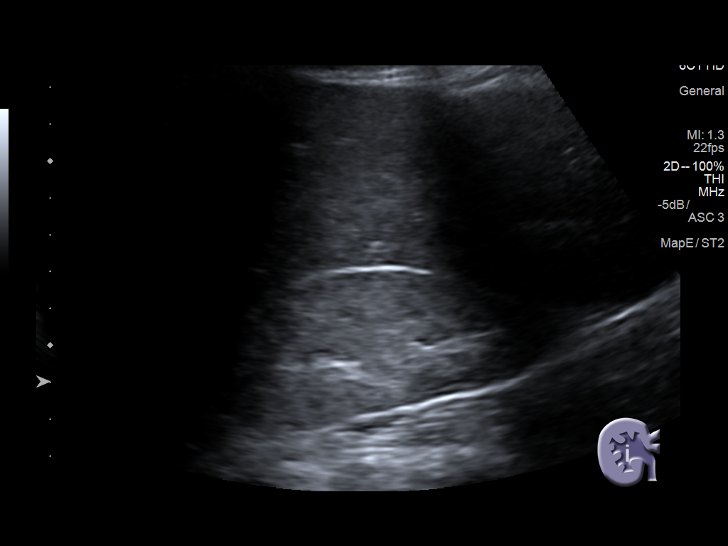
[im 6/36]
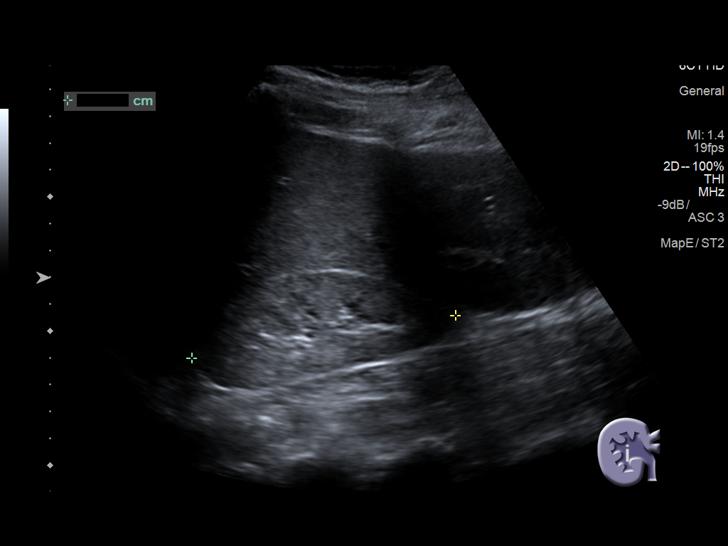
[im 9/36]
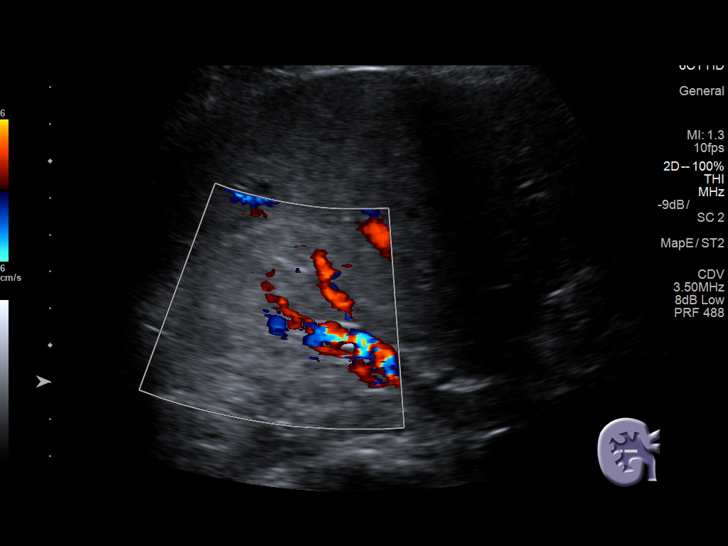
[im 12/36]
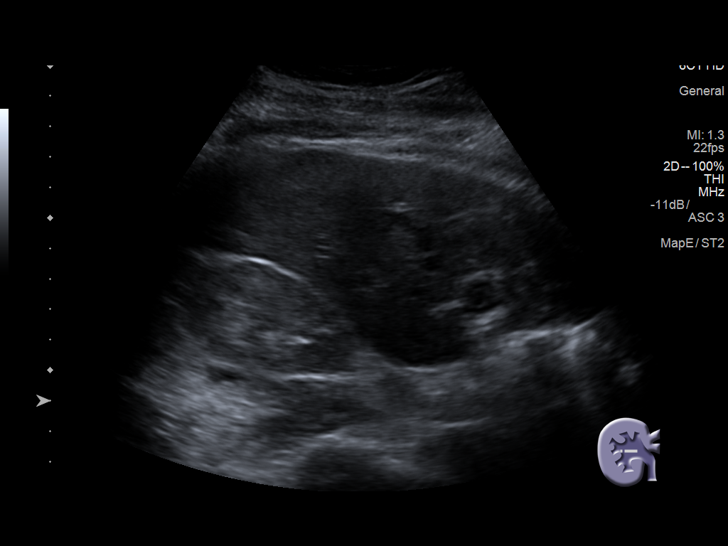
[im 14/36]
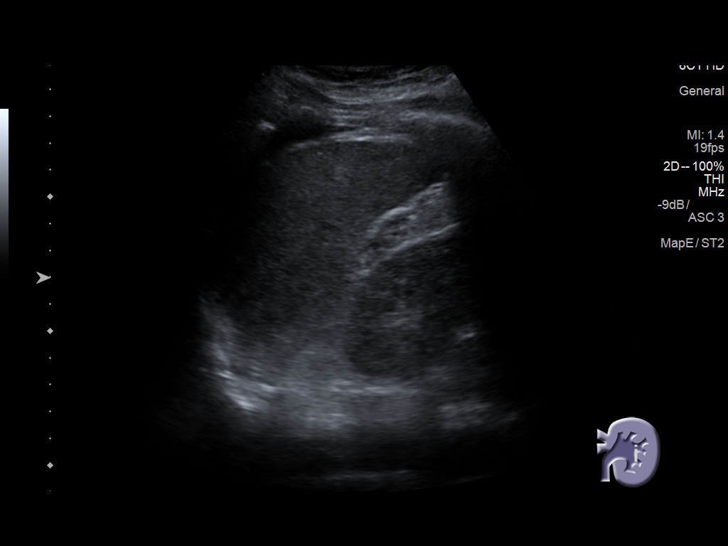
[im 17/36]
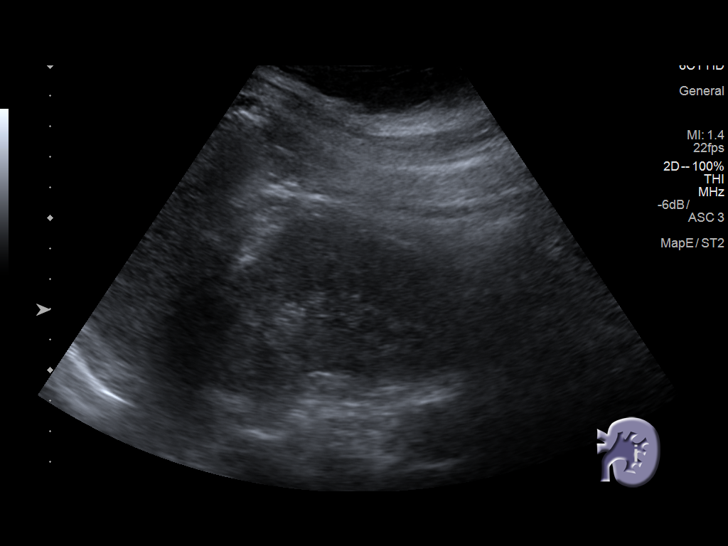
[im 19/36]
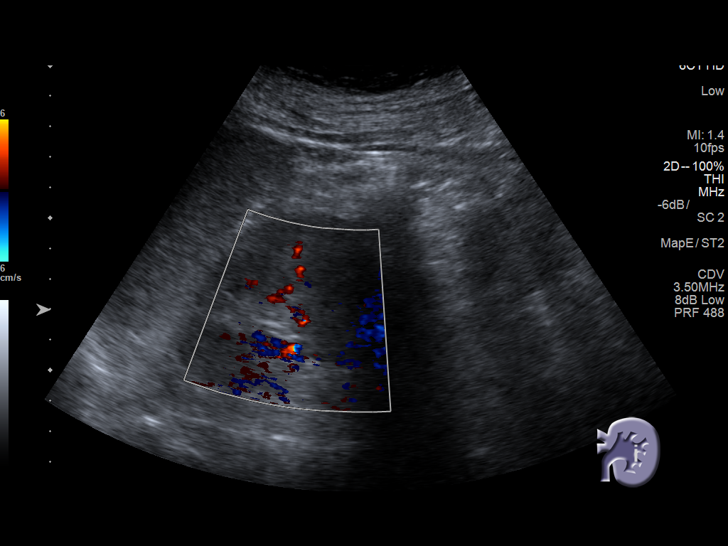
[im 22/36]
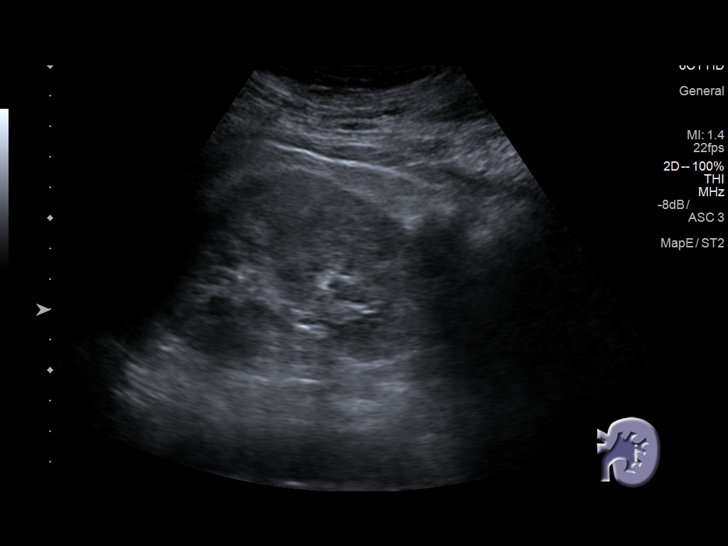
[im 24/36]
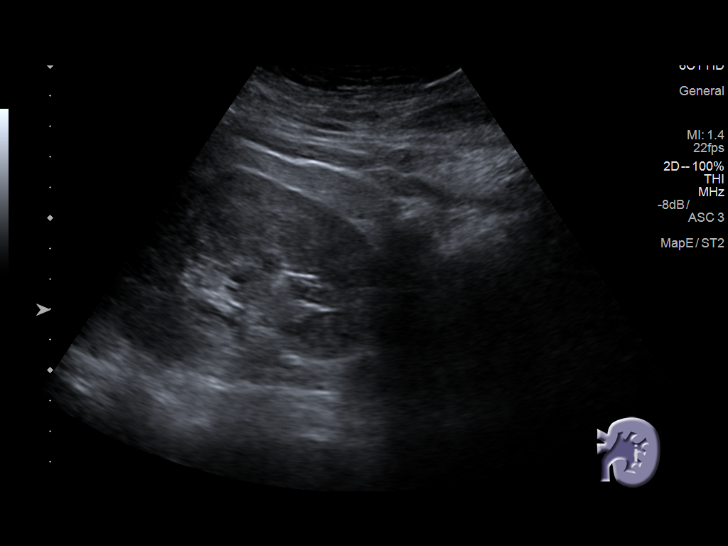
[im 27/36]
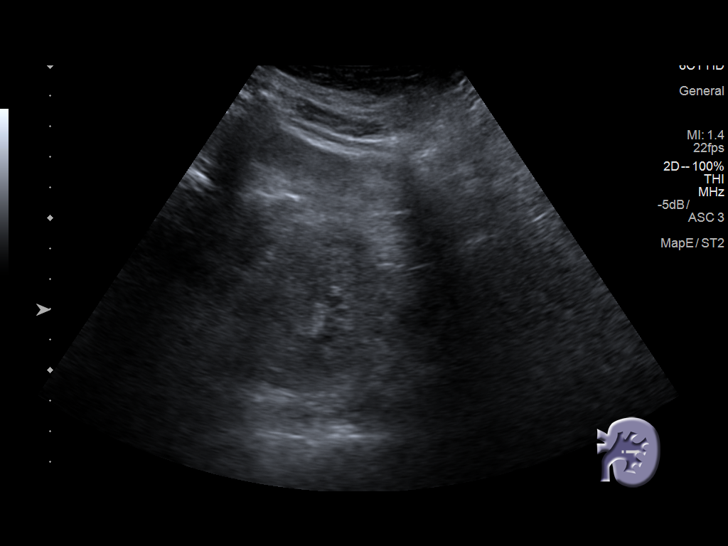
[im 30/36]
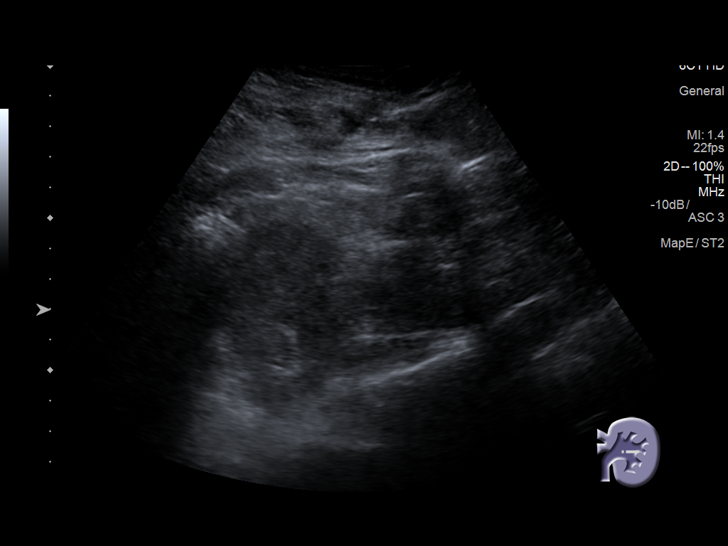
[im 33/36]
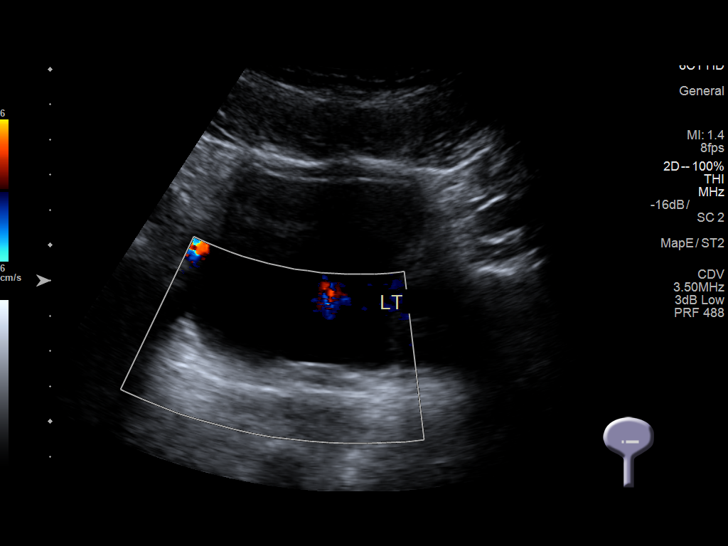
[im 36/36]
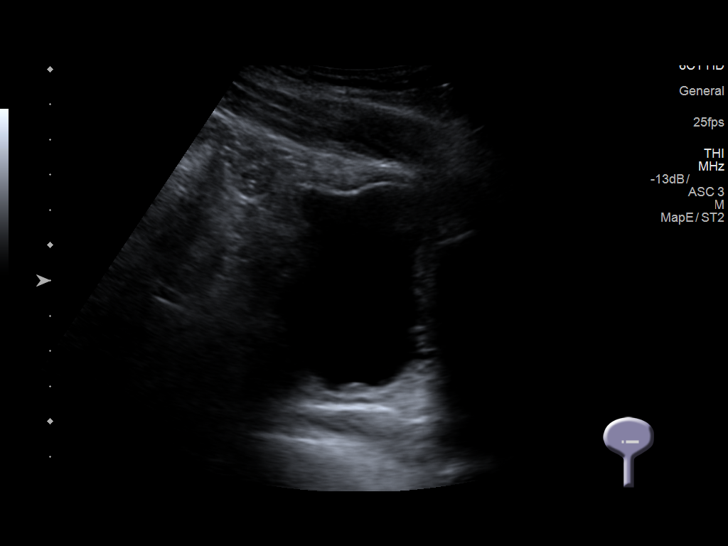

[14 of 25 positions shown; findings below may reference images not displayed]

FINDINGS: Right Kidney:

Length: 10 cm. Increased echogenicity of renal parenchyma is noted
suggesting medical renal disease. No mass or hydronephrosis
visualized.

Left Kidney:

Length: 11 cm. Increased echogenicity of renal parenchyma is noted
suggesting medical renal disease. No mass or hydronephrosis
visualized.

Bladder:

Appears normal for degree of bladder distention. Bilateral ureteral
jets are noted.
IMPRESSION: Increased echogenicity of renal parenchyma is noted bilaterally
suggesting medical renal disease. No hydronephrosis or renal
obstruction is noted.

## 2017-01-09 MED FILL — FUROSEMIDE 80 MG TABLET: 80 | 30 days supply | Qty: 30 | Fill #2

## 2017-01-09 MED FILL — AMLODIPINE BESYLATE 10 MG T: 10 | 30 days supply | Qty: 30 | Fill #2

## 2017-01-09 MED FILL — hydrALAZINE HCL 100 MG TABS: 100 | 30 days supply | Qty: 90 | Fill #2

## 2017-01-09 MED FILL — ISOSORBIDE MN ER 60 MG TAB: 60 | 30 days supply | Qty: 30 | Fill #1

## 2017-01-18 MED FILL — ATORVASTATIN 40 MG TABLET: 40 | 30 days supply | Qty: 30 | Fill #2

## 2017-01-18 MED FILL — cloNIDine HCL 0.2 MG TABS: 0.2 | 30 days supply | Qty: 90 | Fill #2

## 2017-01-19 ENCOUNTER — Ambulatory Visit (HOSPITAL_BASED_OUTPATIENT_CLINIC_OR_DEPARTMENT_OTHER): Payer: Self-pay | Attending: Physician Assistant

## 2017-01-26 ENCOUNTER — Ambulatory Visit: Payer: Self-pay | Admitting: Family Medicine

## 2017-01-26 NOTE — Progress Notes (Deleted)
   Subjective:  Patient ID: Stanley [name], male    DOB: [birth year]  Age: 41 y.o. MRN: [id number]  CC: No chief complaint on file.   HPI Rodd S Keats presents for  History of uncontrolled HTN with LVH.  Chronic kidney disease stage four he is being followed by a nephrologist. Labetalol was increased to 200 mg BID and Imdur to 60 mg QD. Cardiologist recommended full renal work up secondary to uncontrolled HTN which included a renal artery duplex study. Patient was no show. It was also recommended he  Follow up in blood pressure clinic in 2 weeks f/u? Smoking?  Outpatient Medications Prior to Visit  Medication Sig Dispense Refill  . amLODipine (NORVASC) 10 MG tablet Take 1 tablet (10 mg total) by mouth daily. 90 tablet 3  . atorvastatin (LIPITOR) 40 MG tablet Take 1 tablet (40 mg total) by mouth daily at 6 PM. 30 tablet 3  . benzonatate (TESSALON) 100 MG capsule Take 1 capsule (100 mg total) by mouth 2 (two) times daily as needed for cough. 20 capsule 0  . cloNIDine (CATAPRES) 0.2 MG tablet Take 1 tablet (0.2 mg total) by mouth every 8 (eight) hours. 90 tablet 3  . furosemide (LASIX) 80 MG tablet Take 1 tablet (80 mg total) by mouth daily. 30 tablet 3  . hydrALAZINE (APRESOLINE) 100 MG tablet Take 1 tablet (100 mg total) by mouth 3 (three) times daily. 90 tablet 3  . isosorbide mononitrate (IMDUR) 60 MG 24 hr tablet Take 1 tablet (60 mg total) by mouth daily. 90 tablet 1  . labetalol (NORMODYNE) 200 MG tablet Take 2 tablets (400 mg total) by mouth 2 (two) times daily. 120 tablet 3  . nicotine (NICODERM CQ - DOSED IN MG/24 HOURS) 14 mg/24hr patch Place 1 patch (14 mg total) onto the skin daily. 28 patch 0   No facility-administered medications prior to visit.     ROS Review of Systems  Review of Systems - {ros master:310782}    Objective:  There were no vitals taken for this visit.  BP/Weight 11/30/2016 0/16/0109 12/03/3555  Systolic BP 322 025 427  Diastolic BP 062 90 95  Wt. (Lbs)  221.4 222 -  BMI 30.03 30.11 -     Physical Exam   Assessment & Plan:   Problem List Items Addressed This Visit    None      No orders of the defined types were placed in this encounter.   Follow-up: No Follow-up on file.   Alfonse Spruce FNP

## 2017-02-07 MED FILL — AMLODIPINE BESYLATE 10 MG T: 10 | 30 days supply | Qty: 30 | Fill #3

## 2017-02-07 MED FILL — ?FUROSEMIDE 80MG TABLET: 80 | 30 days supply | Qty: 30 | Fill #3

## 2017-02-07 MED FILL — ISOSORBIDE MN ER 60 MG TAB: 60 | 30 days supply | Qty: 30 | Fill #2

## 2017-02-14 ENCOUNTER — Ambulatory Visit: Payer: Self-pay | Admitting: Family Medicine

## 2017-02-14 NOTE — Progress Notes (Deleted)
   Subjective:  Patient ID: Stanley [name], male    DOB: 1976/03/31  Age: 41 y.o. MRN: [id number]  CC: No chief complaint on file.   HPI Vernice S Brittian presents for   Hypertension: Patient here for follow-up of elevated blood pressure. He {is/is not:9024} exercising and {is/is not:9024} adherent to low salt diet.  Blood pressure {is/is not:9024} well controlled at home. Cardiac symptoms {Symptoms; cardiac:12860}. Patient denies {Symptoms; cardiac:12860}.  Cardiovascular risk factors: {cv risk factors:510}. Use of agents associated with hypertension: {bp agents assoc with hypertension:511::"none"}. History of target organ damage: {target organ:[phone]}.  Cardio   Outpatient Medications Prior to Visit  Medication Sig Dispense Refill  . amLODipine (NORVASC) 10 MG tablet Take 1 tablet (10 mg total) by mouth daily. 90 tablet 3  . atorvastatin (LIPITOR) 40 MG tablet Take 1 tablet (40 mg total) by mouth daily at 6 PM. 30 tablet 3  . benzonatate (TESSALON) 100 MG capsule Take 1 capsule (100 mg total) by mouth 2 (two) times daily as needed for cough. 20 capsule 0  . cloNIDine (CATAPRES) 0.2 MG tablet Take 1 tablet (0.2 mg total) by mouth every 8 (eight) hours. 90 tablet 3  . furosemide (LASIX) 80 MG tablet Take 1 tablet (80 mg total) by mouth daily. 30 tablet 3  . hydrALAZINE (APRESOLINE) 100 MG tablet Take 1 tablet (100 mg total) by mouth 3 (three) times daily. 90 tablet 3  . isosorbide mononitrate (IMDUR) 60 MG 24 hr tablet Take 1 tablet (60 mg total) by mouth daily. 90 tablet 1  . labetalol (NORMODYNE) 200 MG tablet Take 2 tablets (400 mg total) by mouth 2 (two) times daily. 120 tablet 3  . nicotine (NICODERM CQ - DOSED IN MG/24 HOURS) 14 mg/24hr patch Place 1 patch (14 mg total) onto the skin daily. 28 patch 0   No facility-administered medications prior to visit.     ROS Review of Systems     Objective:  There were no vitals taken for this visit.  BP/Weight 11/30/2016 11/15/2016 06/05/8100   Systolic BP 751 025 852  Diastolic BP 778 90 95  Wt. (Lbs) 221.4 222 -  BMI 30.03 30.11 -     Physical Exam   Assessment & Plan:   Problem List Items Addressed This Visit    None      No orders of the defined types were placed in this encounter.   Follow-up: No Follow-up on file.   Alfonse Spruce FNP

## 2017-02-15 MED FILL — LABETALOL HCL 200 MG TABLET: 200 | 30 days supply | Qty: 120 | Fill #1

## 2017-02-23 ENCOUNTER — Other Ambulatory Visit: Payer: Self-pay | Admitting: Physician Assistant

## 2017-02-23 MED FILL — cloNIDine HCL 0.2 MG TABS: 0.2 | 30 days supply | Qty: 90 | Fill #3

## 2017-03-01 ENCOUNTER — Encounter: Payer: Self-pay | Admitting: Internal Medicine

## 2017-03-01 DIAGNOSIS — N184 Chronic kidney disease, stage 4 (severe): Secondary | ICD-10-CM

## 2017-03-01 DIAGNOSIS — D638 Anemia in other chronic diseases classified elsewhere: Secondary | ICD-10-CM | POA: Insufficient documentation

## 2017-03-01 DIAGNOSIS — I129 Hypertensive chronic kidney disease with stage 1 through stage 4 chronic kidney disease, or unspecified chronic kidney disease: Secondary | ICD-10-CM | POA: Insufficient documentation

## 2017-03-01 DIAGNOSIS — N185 Chronic kidney disease, stage 5: Secondary | ICD-10-CM | POA: Insufficient documentation

## 2017-03-01 DIAGNOSIS — N2581 Secondary hyperparathyroidism of renal origin: Secondary | ICD-10-CM | POA: Insufficient documentation

## 2017-03-03 MED FILL — hydrALAZINE HCL 100 MG TABS: 100 | 30 days supply | Qty: 90 | Fill #3

## 2017-03-13 ENCOUNTER — Other Ambulatory Visit: Payer: Self-pay | Admitting: Physician Assistant

## 2017-03-13 MED FILL — ATORVASTATIN 40 MG TABLET: 40 | 30 days supply | Qty: 30 | Fill #3

## 2017-03-13 MED FILL — ?FUROSEMIDE 80MG TABLET: 80 | 7 days supply | Qty: 7 | Fill #0

## 2017-03-16 ENCOUNTER — Ambulatory Visit: Payer: Self-pay | Attending: Internal Medicine | Admitting: Internal Medicine

## 2017-03-21 ENCOUNTER — Other Ambulatory Visit: Payer: Self-pay | Admitting: Physician Assistant

## 2017-03-21 ENCOUNTER — Other Ambulatory Visit: Payer: Self-pay | Admitting: Internal Medicine

## 2017-03-24 MED FILL — FUROSEMIDE 80 MG TABLET: 80 | 30 days supply | Qty: 30 | Fill #0

## 2017-03-27 MED FILL — ATORVASTATIN 40 MG TABLET: 40 | 7 days supply | Qty: 7 | Fill #0

## 2017-04-04 ENCOUNTER — Ambulatory Visit: Payer: Self-pay | Admitting: Internal Medicine

## 2017-04-10 ENCOUNTER — Other Ambulatory Visit: Payer: Self-pay | Admitting: Internal Medicine

## 2017-04-10 MED FILL — cloNIDine HCL 0.2 MG TABS: 0.2 | 30 days supply | Qty: 90 | Fill #0

## 2017-04-10 MED FILL — hydrALAZINE HCL 100 MG TABS: 100 | 30 days supply | Qty: 90 | Fill #4

## 2017-04-10 MED FILL — LABETALOL HCL 200 MG TABLET: 200 | 30 days supply | Qty: 120 | Fill #2

## 2017-04-10 MED FILL — ?AMLODIPINE BESYLATE 10 MG: 10 | 30 days supply | Qty: 30 | Fill #4

## 2017-04-10 MED FILL — ISOSORBIDE MN ER 60 MG TAB: 60 | 30 days supply | Qty: 30 | Fill #3

## 2017-04-20 ENCOUNTER — Other Ambulatory Visit: Payer: Self-pay | Admitting: Internal Medicine

## 2017-04-20 MED FILL — ?FUROSEMIDE 80MG TABLET: 80 | 30 days supply | Qty: 30 | Fill #0

## 2017-04-21 ENCOUNTER — Other Ambulatory Visit: Payer: Self-pay | Admitting: Internal Medicine

## 2017-05-12 MED FILL — hydrALAZINE HCL 100 MG TABS: 100 | 30 days supply | Qty: 90 | Fill #5

## 2017-05-12 MED FILL — ISOSORBIDE MN ER 60 MG TAB: 60 | 30 days supply | Qty: 30 | Fill #4

## 2017-05-12 MED FILL — LABETALOL HCL 200 MG TABLET: 200 | 30 days supply | Qty: 120 | Fill #3

## 2017-05-12 MED FILL — cloNIDine HCL 0.2 MG TABS: 0.2 | 30 days supply | Qty: 90 | Fill #1

## 2017-05-12 MED FILL — ?ATORVASTATIN 40MG TABLET: 40 | 30 days supply | Qty: 30 | Fill #0

## 2017-05-12 MED FILL — AMLODIPINE BESYLATE 10 MG T: 10 | 30 days supply | Qty: 30 | Fill #5

## 2017-05-30 ENCOUNTER — Other Ambulatory Visit: Payer: Self-pay | Admitting: Internal Medicine

## 2017-05-30 MED FILL — ?FUROSEMIDE 80MG TABLET: 80 | 30 days supply | Qty: 30 | Fill #0

## 2017-06-03 DIAGNOSIS — I639 Cerebral infarction, unspecified: Secondary | ICD-10-CM

## 2017-06-03 HISTORY — DX: Cerebral infarction, unspecified: I63.9

## 2017-06-12 ENCOUNTER — Encounter (HOSPITAL_COMMUNITY): Payer: Self-pay | Admitting: Emergency Medicine

## 2017-06-12 ENCOUNTER — Emergency Department (HOSPITAL_COMMUNITY): Payer: Self-pay

## 2017-06-12 ENCOUNTER — Inpatient Hospital Stay (HOSPITAL_COMMUNITY)
Admission: EM | Admit: 2017-06-12 | Discharge: 2017-06-16 | DRG: 305 | Disposition: A | Discharge status: Home / Self Care | Payer: Self-pay | Attending: Internal Medicine | Admitting: Internal Medicine

## 2017-06-12 ENCOUNTER — Inpatient Hospital Stay (HOSPITAL_COMMUNITY): Payer: Self-pay

## 2017-06-12 DIAGNOSIS — E785 Hyperlipidemia, unspecified: Secondary | ICD-10-CM | POA: Diagnosis present

## 2017-06-12 DIAGNOSIS — I5032 Chronic diastolic (congestive) heart failure: Secondary | ICD-10-CM | POA: Diagnosis present

## 2017-06-12 DIAGNOSIS — H5461 Unqualified visual loss, right eye, normal vision left eye: Secondary | ICD-10-CM | POA: Diagnosis present

## 2017-06-12 DIAGNOSIS — Z716 Tobacco abuse counseling: Secondary | ICD-10-CM

## 2017-06-12 DIAGNOSIS — Z8249 Family history of ischemic heart disease and other diseases of the circulatory system: Secondary | ICD-10-CM

## 2017-06-12 DIAGNOSIS — I130 Hypertensive heart and chronic kidney disease with heart failure and stage 1 through stage 4 chronic kidney disease, or unspecified chronic kidney disease: Secondary | ICD-10-CM | POA: Diagnosis present

## 2017-06-12 DIAGNOSIS — Z9119 Patient's noncompliance with other medical treatment and regimen: Secondary | ICD-10-CM

## 2017-06-12 DIAGNOSIS — Z8673 Personal history of transient ischemic attack (TIA), and cerebral infarction without residual deficits: Secondary | ICD-10-CM

## 2017-06-12 DIAGNOSIS — I161 Hypertensive emergency: Principal | ICD-10-CM | POA: Diagnosis present

## 2017-06-12 DIAGNOSIS — I674 Hypertensive encephalopathy: Secondary | ICD-10-CM | POA: Diagnosis present

## 2017-06-12 DIAGNOSIS — Z23 Encounter for immunization: Secondary | ICD-10-CM

## 2017-06-12 DIAGNOSIS — H539 Unspecified visual disturbance: Secondary | ICD-10-CM

## 2017-06-12 DIAGNOSIS — Z833 Family history of diabetes mellitus: Secondary | ICD-10-CM

## 2017-06-12 DIAGNOSIS — R519 Headache, unspecified: Secondary | ICD-10-CM

## 2017-06-12 DIAGNOSIS — I160 Hypertensive urgency: Secondary | ICD-10-CM | POA: Diagnosis present

## 2017-06-12 DIAGNOSIS — R51 Headache: Secondary | ICD-10-CM

## 2017-06-12 DIAGNOSIS — F17200 Nicotine dependence, unspecified, uncomplicated: Secondary | ICD-10-CM | POA: Diagnosis present

## 2017-06-12 DIAGNOSIS — E876 Hypokalemia: Secondary | ICD-10-CM | POA: Diagnosis present

## 2017-06-12 DIAGNOSIS — N184 Chronic kidney disease, stage 4 (severe): Secondary | ICD-10-CM | POA: Diagnosis present

## 2017-06-12 HISTORY — DX: Pure hypercholesterolemia, unspecified: E78.00

## 2017-06-12 HISTORY — DX: Anxiety disorder, unspecified: F41.9

## 2017-06-12 HISTORY — DX: Chronic kidney disease, stage 4 (severe): N18.4

## 2017-06-12 HISTORY — DX: Depression, unspecified: F32.A

## 2017-06-12 HISTORY — DX: Cerebral infarction, unspecified: I63.9

## 2017-06-12 HISTORY — DX: Dyslexia and alexia: R48.0

## 2017-06-12 HISTORY — DX: Major depressive disorder, single episode, unspecified: F32.9

## 2017-06-12 LAB — COMPREHENSIVE METABOLIC PANEL
ALK PHOS: 88 U/L (ref 38–126)
ALT: 18 U/L (ref 17–63)
AST: 23 U/L (ref 15–41)
Albumin: 4.2 g/dL (ref 3.5–5.0)
Anion gap: 12 (ref 5–15)
BILIRUBIN TOTAL: 1 mg/dL (ref 0.3–1.2)
BUN: 22 mg/dL — AB (ref 6–20)
CALCIUM: 9.2 mg/dL (ref 8.9–10.3)
CO2: 24 mmol/L (ref 22–32)
CREATININE: 3.11 mg/dL — AB (ref 0.61–1.24)
Chloride: 104 mmol/L (ref 101–111)
GFR calc Af Amer: 27 mL/min — ABNORMAL LOW (ref >60)
GFR, EST NON AFRICAN AMERICAN: 24 mL/min — AB (ref >60)
GLUCOSE: 99 mg/dL (ref 65–99)
POTASSIUM: 3.4 mmol/L — AB (ref 3.5–5.1)
Sodium: 140 mmol/L (ref 135–145)
TOTAL PROTEIN: 7.4 g/dL (ref 6.5–8.1)

## 2017-06-12 LAB — CBC WITH DIFFERENTIAL/PLATELET
BASOS ABS: 0 10*3/uL (ref 0.0–0.1)
Basophils Relative: 1 %
Eosinophils Absolute: 0 10*3/uL (ref 0.0–0.7)
Eosinophils Relative: 0 %
HCT: 37.7 % — ABNORMAL LOW (ref 39.0–52.0)
HEMOGLOBIN: 12.4 g/dL — AB (ref 13.0–17.0)
Lymphocytes Relative: 11 %
Lymphs Abs: 0.7 10*3/uL (ref 0.7–4.0)
MCH: 28.2 pg (ref 26.0–34.0)
MCHC: 32.9 g/dL (ref 30.0–36.0)
MCV: 85.7 fL (ref 78.0–100.0)
MONO ABS: 0.4 10*3/uL (ref 0.1–1.0)
MONOS PCT: 6 %
NEUTROS ABS: 5.1 10*3/uL (ref 1.7–7.7)
NEUTROS PCT: 82 %
Platelets: 262 10*3/uL (ref 150–400)
RBC: 4.4 MIL/uL (ref 4.22–5.81)
RDW: 14.6 % (ref 11.5–15.5)
WBC: 6.3 10*3/uL (ref 4.0–10.5)

## 2017-06-12 LAB — RAPID URINE DRUG SCREEN, HOSP PERFORMED
AMPHETAMINES: NOT DETECTED
Barbiturates: NOT DETECTED
Benzodiazepines: NOT DETECTED
Cocaine: NOT DETECTED
OPIATES: NOT DETECTED
Tetrahydrocannabinol: POSITIVE — AB

## 2017-06-12 MED ORDER — HYDRALAZINE HCL 50 MG PO TABS
100.0000 mg | ORAL_TABLET | Freq: Three times a day (TID) | ORAL | Status: DC
  Administered 2017-06-12 – 2017-06-16 (×11): 100 mg via ORAL
  Filled 2017-06-12 (×11): qty 2

## 2017-06-12 MED ORDER — NICARDIPINE HCL IN NACL 20-0.86 MG/200ML-% IV SOLN
INTRAVENOUS | Status: AC
  Filled 2017-06-12: qty 200

## 2017-06-12 MED ORDER — SODIUM CHLORIDE 0.9 % IV SOLN: 250.0000 mL | INTRAVENOUS | Status: DC | PRN

## 2017-06-12 MED ORDER — PANTOPRAZOLE SODIUM 40 MG PO TBEC
40.0000 mg | DELAYED_RELEASE_TABLET | Freq: Every day | ORAL | Status: DC
  Administered 2017-06-12: 40 mg via ORAL
  Filled 2017-06-12: qty 1

## 2017-06-12 MED ORDER — NICARDIPINE HCL IN NACL 20-0.86 MG/200ML-% IV SOLN
INTRAVENOUS | Status: AC
  Administered 2017-06-12: 10 mg via INTRAVENOUS
  Filled 2017-06-12: qty 200

## 2017-06-12 MED ORDER — SODIUM CHLORIDE 0.9 % IV SOLN: INTRAVENOUS | Status: DC

## 2017-06-12 MED ORDER — HEPARIN SODIUM (PORCINE) 5000 UNIT/ML IJ SOLN
5000.0000 [IU] | Freq: Three times a day (TID) | INTRAMUSCULAR | Status: DC
  Administered 2017-06-12 – 2017-06-16 (×12): 5000 [IU] via SUBCUTANEOUS
  Filled 2017-06-12 (×12): qty 1

## 2017-06-12 MED ORDER — HYDRALAZINE HCL 20 MG/ML IJ SOLN
10.0000 mg | Freq: Once | INTRAMUSCULAR | Status: AC
  Administered 2017-06-12: 10 mg via INTRAVENOUS
  Filled 2017-06-12: qty 1

## 2017-06-12 MED ORDER — NICARDIPINE HCL IN NACL 20-0.86 MG/200ML-% IV SOLN
3.0000 mg/h | INTRAVENOUS | Status: DC
  Administered 2017-06-12: 12.5 mg/h via INTRAVENOUS
  Administered 2017-06-12 (×4): 15 mg/h via INTRAVENOUS
  Filled 2017-06-12 (×7): qty 200

## 2017-06-12 MED ORDER — LABETALOL HCL 200 MG PO TABS
400.0000 mg | ORAL_TABLET | Freq: Two times a day (BID) | ORAL | Status: DC
  Administered 2017-06-12 – 2017-06-13 (×3): 400 mg via ORAL
  Filled 2017-06-12 (×3): qty 2

## 2017-06-12 MED ORDER — AMLODIPINE BESYLATE 10 MG PO TABS: 10.0000 mg | ORAL_TABLET | Freq: Every day | ORAL | Status: DC

## 2017-06-12 MED ORDER — LORAZEPAM 2 MG/ML IJ SOLN
1.0000 mg | Freq: Once | INTRAMUSCULAR | Status: AC
  Administered 2017-06-12: 1 mg via INTRAVENOUS
  Filled 2017-06-12: qty 1

## 2017-06-12 MED ORDER — ONDANSETRON HCL 4 MG/2ML IJ SOLN
4.0000 mg | Freq: Once | INTRAMUSCULAR | Status: AC
  Administered 2017-06-12: 4 mg via INTRAVENOUS
  Filled 2017-06-12: qty 2

## 2017-06-12 MED ORDER — PROCHLORPERAZINE EDISYLATE 5 MG/ML IJ SOLN
10.0000 mg | Freq: Once | INTRAMUSCULAR | Status: AC
  Administered 2017-06-12: 10 mg via INTRAVENOUS
  Filled 2017-06-12: qty 2

## 2017-06-12 MED ORDER — NICARDIPINE HCL IN NACL 20-0.86 MG/200ML-% IV SOLN
3.0000 mg/h | Freq: Once | INTRAVENOUS | Status: AC
  Administered 2017-06-12: 3 mg/h via INTRAVENOUS
  Administered 2017-06-12: 10 mg via INTRAVENOUS
  Filled 2017-06-12: qty 200

## 2017-06-12 MED ORDER — DIPHENHYDRAMINE HCL 50 MG/ML IJ SOLN
25.0000 mg | Freq: Once | INTRAMUSCULAR | Status: AC
  Administered 2017-06-12: 25 mg via INTRAVENOUS
  Filled 2017-06-12: qty 1

## 2017-06-12 NOTE — ED Provider Notes (Addendum)
[facility] DEPT Provider Note   CSN: [id number] Arrival date & time: 06/12/17  2440     History   Chief Complaint Chief Complaint  Patient presents with  . Headache  . Hypertension    HPI Braden BURNARD ENIS is a 41 y.o. male.  HPI  41yo male with history of hypertension presents with concern for headache, nausea and vomiting.  Reports vomiting 4 times last night. Had headache that started, severe 10/10 occipital headache, began having headache yesterday and slowly worsened peaking last night around 3AM. Reports visual changes, specifically feeling like he can't see out of his right side beginning around 3AM he believes. Denies double vision.  Has hx of htn, took medication last night but not this morning.  Denies difficulty speaking, walking, numbness/weakness. Reports 8/10 LUQ abdominal pain.     Past Medical History:  Diagnosis Date  . Hypertension   . Tobacco abuse     Patient Active Problem List   Diagnosis Date Noted  . Hypertensive urgency 06/12/2017  . 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 Anemia, chronic disease 03/01/2017  . Hyperparathyroidism, secondary renal ([place]) 03/01/2017  . Acute dyspnea   . Tobacco abuse 09/26/2016  . Elevated troponin 09/26/2016  . Hypertension 09/26/2016  . Hypokalemia 09/26/2016  . AKI (acute kidney injury) ([place]) 09/26/2016    History reviewed. No pertinent surgical history.     Home Medications    Prior to Admission medications   Medication Sig Start Date End Date Taking? Authorizing Provider  amLODipine (NORVASC) 10 MG tablet Take 1 tablet (10 mg total) by mouth daily. 11/02/16  Yes Brayton Caves, PA-C  aspirin EC 81 MG tablet Take 81 mg by mouth daily as needed for mild pain.   Yes [provider]  atorvastatin (LIPITOR) 40 MG tablet TAKE 1 TABLET BY MOUTH DAILY AT 6 PM. 04/21/17  Yes Ladell Pier, MD  cloNIDine (CATAPRES) 0.2 MG tablet TAKE 1 TABLET BY MOUTH EVERY 8 HOURS 02/23/17  Yes  Ladell Pier, MD  furosemide (LASIX) 80 MG tablet TAKE 1 TABLET (80 MG TOTAL) BY MOUTH DAILY. 05/30/17  Yes Ladell Pier, MD  hydrALAZINE (APRESOLINE) 100 MG tablet Take 1 tablet (100 mg total) by mouth 3 (three) times daily. 11/02/16  Yes Ena Dawley, Tiffany S, PA-C  isosorbide mononitrate (IMDUR) 60 MG 24 hr tablet Take 1 tablet (60 mg total) by mouth daily. 11/30/16  Yes Eileen Stanford, PA-C  labetalol (NORMODYNE) 200 MG tablet Take 2 tablets (400 mg total) by mouth 2 (two) times daily. 11/30/16  Yes Eileen Stanford, PA-C  Multiple Vitamin (MULTIVITAMIN WITH MINERALS) TABS tablet Take 1 tablet by mouth daily as needed.   Yes [provider]  benzonatate (TESSALON) 100 MG capsule Take 1 capsule (100 mg total) by mouth 2 (two) times daily as needed for cough. 11/15/16   Clent Demark, PA-C  nicotine (NICODERM CQ - DOSED IN MG/24 HOURS) 14 mg/24hr patch Place 1 patch (14 mg total) onto the skin daily. 11/02/16   Brayton Caves, PA-C    Family History Family History  Problem Relation Age of Onset  . Hypertension Mother   . Hypertension Brother   . Diabetes type II Brother     Social History Social History  Substance Use Topics  . Smoking status: Current Every Day Smoker  . Smokeless tobacco: Never Used  . Alcohol use Yes     Allergies   Lactose intolerance (gi); Other; and Nitroglycerin  Review of Systems Review of Systems  Constitutional: Positive for fever (subjective).  HENT: Negative for sore throat.   Eyes: Positive for visual disturbance.  Respiratory: Negative for shortness of breath.   Cardiovascular: Negative for chest pain.  Gastrointestinal: Positive for abdominal pain, nausea and vomiting.  Genitourinary: Negative for difficulty urinating.  Musculoskeletal: Negative for back pain and neck stiffness.  Skin: Negative for rash.  Neurological: Positive for light-headedness and headaches. Negative for dizziness, syncope, weakness and numbness.       Physical Exam Updated Vital Signs BP (!) 179/74   Pulse 99   Temp 98.3 F (36.8 C) (Oral)   Resp (!) 21   SpO2 97%   Physical Exam  Constitutional: He is oriented to person, place, and time. He appears well-developed and well-nourished. He appears ill. No distress.  HENT:  Head: Normocephalic and atraumatic.  Eyes: Conjunctivae and EOM are normal.  Right visual field deficit  Neck: Normal range of motion.  Cardiovascular: Normal rate, regular rhythm, normal heart sounds and intact distal pulses.  Exam reveals no gallop and no friction rub.   No murmur heard. Pulmonary/Chest: Effort normal and breath sounds normal. No respiratory distress. He has no wheezes. He has no rales.  Abdominal: Soft. He exhibits no distension. There is no tenderness. There is no guarding.  Musculoskeletal: He exhibits no edema.  Neurological: He is alert and oriented to person, place, and time. He has normal strength. No cranial nerve deficit or sensory deficit. Coordination normal. GCS eye subscore is 4. GCS verbal subscore is 5. GCS motor subscore is 6.  Reports numbness below the knee on the right side. Right visual field deficit Otherwise normal CN/strength  Skin: Skin is warm and dry. He is not diaphoretic.  Nursing note and vitals reviewed.    ED Treatments / Results  Labs (all labs ordered are listed, but only abnormal results are displayed) Labs Reviewed  CBC WITH DIFFERENTIAL/PLATELET - Abnormal; Notable for the following:       Result Value   Hemoglobin 12.4 (*)    HCT 37.7 (*)    All other components within normal limits  COMPREHENSIVE METABOLIC PANEL - Abnormal; Notable for the following:    Potassium 3.4 (*)    BUN 22 (*)    Creatinine, Ser 3.11 (*)    GFR calc non Af Amer 24 (*)    GFR calc Af Amer 27 (*)    All other components within normal limits  RAPID URINE DRUG SCREEN, HOSP PERFORMED - Abnormal; Notable for the following:    Tetrahydrocannabinol POSITIVE (*)    All  other components within normal limits  CBC  CREATININE, SERUM  HIV ANTIBODY (ROUTINE TESTING)  CBC  BASIC METABOLIC PANEL    EKG  EKG Interpretation  Date/Time:  Monday June 12 2017 08:49:08 EDT Ventricular Rate:  75 PR Interval:    QRS Duration: 96 QT Interval:  471 QTC Calculation: 527 R Axis:   37 Text Interpretation:  Sinus rhythm Right atrial enlargement Borderline repolarization abnormality Prolonged QT interval No significant change since last tracing Confirmed by Gareth Morgan [phone]) on 06/12/2017 10:25:08 AM       Radiology Ct Head Wo Contrast  Result Date: 06/12/2017 CLINICAL DATA:  Left occipital headache for 1 day with nausea and vomiting. Hypertension. Dizziness. EXAM: CT HEAD WITHOUT CONTRAST TECHNIQUE: Contiguous axial images were obtained from the base of the skull through the vertex without intravenous contrast. COMPARISON:  None. FINDINGS: Brain: The ventricles are normal in  size and configuration. There is no intracranial mass, hemorrhage, extra-axial fluid collection, or midline shift. Gray-white compartments are normal. No evident acute infarct. Vascular: There is no hyperdense vessel. There is no appreciable vascular calcification. Skull: Bony calvarium appears intact. Sinuses/Orbits: There is mucosal thickening in both maxillary antra, considerably more on the left than on the right. There is mucosal thickening and opacification in multiple ethmoid air cells bilaterally. There is opacification in both anterior sphenoid sinus regions, more severe on the right than on the left. There is mild mucosal thickening in the inferior, medial aspects of each frontal sinus. There is rightward deviation of the nasal septum. Orbits appear symmetric bilaterally. Other: Visualized mastoid air cells are clear. IMPRESSION: Multifocal paranasal sinus disease. Deviated nasal septum toward the right. No intracranial mass, hemorrhage, or extra-axial fluid collection. Gray-white  compartments appear normal. Electronically Signed   By: Lowella Grip III M.D.   On: 06/12/2017 08:37   Mr Virgel Paling IP Contrast  Result Date: 06/12/2017 CLINICAL DATA:  Initial evaluation for acute right-sided visual loss, paresthesias, severe headache with severe hypertension. EXAM: MRI HEAD WITHOUT CONTRAST MRA HEAD WITHOUT CONTRAST TECHNIQUE: Multiplanar, multiecho pulse sequences of the brain and surrounding structures were obtained without intravenous contrast. Angiographic images of the head were obtained using MRA technique without contrast. COMPARISON:  Comparison made with prior CT from earlier the same day. FINDINGS: MRI HEAD FINDINGS Brain:  Study moderately degraded by motion artifact. Cerebral volume within normal limits for age. Suspected few scattered foci of T2/FLAIR hyperintensity involving the supratentorial cerebral white matter, nonspecific, but most like related to chronic microvascular ischemic changes. No definite signal changes to suggest underlying PRES or other acute abnormality. No evidence for acute or subacute ischemia. Gray-white matter differentiation maintained. Probable small remote lacunar infarct noted within the right paracentral pons. No other definite evidence for remote ischemia. No acute or chronic intracranial hemorrhage. No evidence for hypertensive encephalopathy. No mass lesion, midline shift or mass effect. No hydrocephalus. No extra-axial fluid collection. Major dural sinuses are grossly patent. Vascular: Major intracranial vascular flow voids grossly maintained. Skull and upper cervical spine: Craniocervical junction within normal limits. Visualized upper cervical spine unremarkable. Bone marrow signal intensity within normal limits. No scalp soft tissue abnormality. Sinuses/Orbits: Globes and orbital soft tissues grossly within normal limits. Mild-to-moderate mucosal thickening noted within the sphenoid, ethmoidal, and maxillary sinuses. No air-fluid level to  suggest acute sinusitis. No mastoid effusion. Inner ear structures grossly normal. Other: None. MRA HEAD FINDINGS ANTERIOR CIRCULATION: Study moderately degraded by motion artifact. Distal cervical segments of the internal carotid arteries are patent with antegrade flow. Petrous, cavernous, and supraclinoid segments patent without flow-limiting stenosis. ICA termini patent. A1 segments grossly patent. Anterior communicating artery grossly normal. Anterior cerebral arteries patent to their distal aspects. No obvious stenosis, although evaluation limited by motion. M1 segments patent without obvious stenosis. Motion limits evaluation of the distal MCA branches which are grossly symmetric and well perfused. POSTERIOR CIRCULATION: Vertebral artery's patent to the vertebrobasilar junction without stenosis. Left vertebral artery dominant. Posterior inferior cerebral arteries patent proximally. Basilar artery patent to its distal aspect without obvious stenosis. Superior cerebral arteries patent proximally. Both of the posterior cerebral artery supplied via the basilar and are widely patent to their distal aspects. No aneurysm. No obvious evidence for vasospasm, although evaluation limited on this motion degraded exam. IMPRESSION: MRI HEAD IMPRESSION: 1. Motion degraded study. 2. No definite acute intracranial process identified. 3. Remote subcentimeter lacunar right pontine infarct. MRA HEAD IMPRESSION:  1. Motion degraded study. 2. Grossly negative intracranial MRA. No large vessel occlusion. No obvious flow-limiting stenosis. No obvious evidence for vasospasm, although evaluation fairly limited on this motion degraded exam. Electronically Signed   By: Jeannine Boga M.D.   On: 06/12/2017 15:56   Mr Brain Wo Contrast  Result Date: 06/12/2017 CLINICAL DATA:  Initial evaluation for acute right-sided visual loss, paresthesias, severe headache with severe hypertension. EXAM: MRI HEAD WITHOUT CONTRAST MRA HEAD  WITHOUT CONTRAST TECHNIQUE: Multiplanar, multiecho pulse sequences of the brain and surrounding structures were obtained without intravenous contrast. Angiographic images of the head were obtained using MRA technique without contrast. COMPARISON:  Comparison made with prior CT from earlier the same day. FINDINGS: MRI HEAD FINDINGS Brain:  Study moderately degraded by motion artifact. Cerebral volume within normal limits for age. Suspected few scattered foci of T2/FLAIR hyperintensity involving the supratentorial cerebral white matter, nonspecific, but most like related to chronic microvascular ischemic changes. No definite signal changes to suggest underlying PRES or other acute abnormality. No evidence for acute or subacute ischemia. Gray-white matter differentiation maintained. Probable small remote lacunar infarct noted within the right paracentral pons. No other definite evidence for remote ischemia. No acute or chronic intracranial hemorrhage. No evidence for hypertensive encephalopathy. No mass lesion, midline shift or mass effect. No hydrocephalus. No extra-axial fluid collection. Major dural sinuses are grossly patent. Vascular: Major intracranial vascular flow voids grossly maintained. Skull and upper cervical spine: Craniocervical junction within normal limits. Visualized upper cervical spine unremarkable. Bone marrow signal intensity within normal limits. No scalp soft tissue abnormality. Sinuses/Orbits: Globes and orbital soft tissues grossly within normal limits. Mild-to-moderate mucosal thickening noted within the sphenoid, ethmoidal, and maxillary sinuses. No air-fluid level to suggest acute sinusitis. No mastoid effusion. Inner ear structures grossly normal. Other: None. MRA HEAD FINDINGS ANTERIOR CIRCULATION: Study moderately degraded by motion artifact. Distal cervical segments of the internal carotid arteries are patent with antegrade flow. Petrous, cavernous, and supraclinoid segments patent  without flow-limiting stenosis. ICA termini patent. A1 segments grossly patent. Anterior communicating artery grossly normal. Anterior cerebral arteries patent to their distal aspects. No obvious stenosis, although evaluation limited by motion. M1 segments patent without obvious stenosis. Motion limits evaluation of the distal MCA branches which are grossly symmetric and well perfused. POSTERIOR CIRCULATION: Vertebral artery's patent to the vertebrobasilar junction without stenosis. Left vertebral artery dominant. Posterior inferior cerebral arteries patent proximally. Basilar artery patent to its distal aspect without obvious stenosis. Superior cerebral arteries patent proximally. Both of the posterior cerebral artery supplied via the basilar and are widely patent to their distal aspects. No aneurysm. No obvious evidence for vasospasm, although evaluation limited on this motion degraded exam. IMPRESSION: MRI HEAD IMPRESSION: 1. Motion degraded study. 2. No definite acute intracranial process identified. 3. Remote subcentimeter lacunar right pontine infarct. MRA HEAD IMPRESSION: 1. Motion degraded study. 2. Grossly negative intracranial MRA. No large vessel occlusion. No obvious flow-limiting stenosis. No obvious evidence for vasospasm, although evaluation fairly limited on this motion degraded exam. Electronically Signed   By: Jeannine Boga M.D.   On: 06/12/2017 15:56    Procedures Procedures (including critical care time)  Medications Ordered in ED Medications  0.9 %  sodium chloride infusion (not administered)  heparin injection 5,000 Units (5,000 Units Subcutaneous Given 06/12/17 1623)  0.9 %  sodium chloride infusion (not administered)  pantoprazole (PROTONIX) EC tablet 40 mg (40 mg Oral Given 06/12/17 1621)  nicardipine (CARDENE) 20mg  in 0.86% saline 272ml IV infusion (0.1 mg/ml) (12.5  mg/hr Intravenous Rate/Dose Change 06/12/17 1725)  ondansetron (ZOFRAN) injection 4 mg (4 mg Intravenous  Given 06/12/17 0835)  prochlorperazine (COMPAZINE) injection 10 mg (10 mg Intravenous Given 06/12/17 0835)  diphenhydrAMINE (BENADRYL) injection 25 mg (25 mg Intravenous Given 06/12/17 0836)  hydrALAZINE (APRESOLINE) injection 10 mg (10 mg Intravenous Given 06/12/17 0835)  nicardipine (CARDENE) 20mg  in 0.86% saline 240ml IV infusion (0.1 mg/ml) (0 mg/hr Intravenous Stopped 06/12/17 1550)  LORazepam (ATIVAN) injection 1 mg (1 mg Intravenous Given 06/12/17 1219)     Initial Impression / Assessment and Plan / ED Course  I have reviewed the triage vital signs and the nursing notes.  Pertinent labs & imaging results that were available during my care of the patient were reviewed by me and considered in my medical decision making (see chart for details).     41yo male with history of hypertension presents with concern for headache, nausea and vomiting and hypertensive urgency/emergency.  Patient with gradual onset of headache and doubt SAH. No fever in ED, doubt meningitis. CT head done showing no acute abnormalities.  Called Neurology emergently upon initial evaluation given concern for visual field deficit on exam. No other neurologic changes and low NIH and do not feel he would be tPA candidate.  Dr. Cheral Marker came to the bedside immediately for evaluation.  Patient hypertensive to 563J systolic on arrival, given hydralazine, and given compazine/benadryl for headache.   No change in blood pressure with hydralazine. Started on nicardipine gtt.  DDx includes hypertensive encephalopathy/PRESS versus less likely CVA. Ordered MRI.    Symptoms improved with cardene gtt, no residual visual changes per pt.  Will admit to CCM for continued management of htn urgency/emergency.     Final Clinical Impressions(s) / ED Diagnoses   Final diagnoses:  Hypertensive emergency  Acute nonintractable headache, unspecified headache type  Visual changes    New Prescriptions New Prescriptions   No medications on  file     Gareth Morgan, MD 06/12/17 1726    Gareth Morgan, MD 06/12/17 1727

## 2017-06-12 NOTE — Progress Notes (Signed)
[facility] Progress Note Patient Name: Stanley [name] DOB: 1976/09/19 MRN: [id number]   Date of Service  06/12/2017  HPI/Events of Note  Tachy on max cardene gtt  eICU Interventions  Resume hydrallazine/labetalol -home doses 2g Na diet     Intervention Category Major Interventions: Hypertension - evaluation and management  [name] V. 06/12/2017, 10:07 PM

## 2017-06-12 NOTE — H&P (Signed)
PULMONARY / CRITICAL CARE MEDICINE   Name: Stanley [name] MRN: [id number] DOB: Jul 09, 1976    ADMISSION DATE:  06/12/2017 CONSULTATION DATE:  9/10  REFERRING MD:  Dr. Billy Fischer  CHIEF COMPLAINT:  SOB  HISTORY OF PRESENT ILLNESS:   41 year old male with past medical history of uncontrolled hypertension. Presented to the emergency department 9/10 with complaints of headache and visual field disturbances for the preceding 24 hours. Upon arrival to the emergency department his systolic pressure was 20 and 50 mmHg. He was initially given when necessary hydralazine and hypotension was refractory so he was started on nicardipine infusion. CT scan of the head was negative for acute abnormalities. His evaluate by neurology in the emergency department his differential includes cerebral vasospasm, multifocal cerebral lesions, or PRES. PCCM asked to admit.   PAST MEDICAL HISTORY :  He  has a past medical history of Hypertension and Tobacco abuse.  PAST SURGICAL HISTORY: He  has no past surgical history on file.  Allergies  Allergen Reactions  . Lactose Intolerance (Gi) Nausea And Vomiting  . Other Swelling    All beans  . Nitroglycerin Anxiety and Other (See Comments)    Pain and feels like he is on fire    No current facility-administered medications on file prior to encounter.    Current Outpatient Prescriptions on File Prior to Encounter  Medication Sig  . amLODipine (NORVASC) 10 MG tablet Take 1 tablet (10 mg total) by mouth daily.  Marland Kitchen atorvastatin (LIPITOR) 40 MG tablet TAKE 1 TABLET BY MOUTH DAILY AT 6 PM.  . cloNIDine (CATAPRES) 0.2 MG tablet TAKE 1 TABLET BY MOUTH EVERY 8 HOURS  . furosemide (LASIX) 80 MG tablet TAKE 1 TABLET (80 MG TOTAL) BY MOUTH DAILY.  . hydrALAZINE (APRESOLINE) 100 MG tablet Take 1 tablet (100 mg total) by mouth 3 (three) times daily.  . isosorbide mononitrate (IMDUR) 60 MG 24 hr tablet Take 1 tablet (60 mg total) by mouth daily.  Marland Kitchen labetalol (NORMODYNE) 200 MG  tablet Take 2 tablets (400 mg total) by mouth 2 (two) times daily.  . benzonatate (TESSALON) 100 MG capsule Take 1 capsule (100 mg total) by mouth 2 (two) times daily as needed for cough.  . nicotine (NICODERM CQ - DOSED IN MG/24 HOURS) 14 mg/24hr patch Place 1 patch (14 mg total) onto the skin daily.    FAMILY HISTORY:  His indicated that the status of his mother is unknown. He indicated that the status of his brother is unknown.    SOCIAL HISTORY: He  reports that he has been smoking.  He has never used smokeless tobacco. He reports that he drinks alcohol. He reports that he does not use drugs.  REVIEW OF SYSTEMS:   Bolds are positive  Constitutional: weight loss, gain, night sweats, Fevers, chills, fatigue .  HEENT: headaches, Sore throat, sneezing, nasal congestion, post nasal drip, Difficulty swallowing, Tooth/dental problems, visual complaints visual changes, ear ache CV:  chest pain, radiates:,Orthopnea, PND, swelling in lower extremities, dizziness, palpitations, syncope.  GI  heartburn, indigestion, abdominal pain, nausea, vomiting, diarrhea, change in bowel habits, loss of appetite, bloody stools.  Resp: cough, productive:, hemoptysis, dyspnea, chest pain, pleuritic.  Skin: rash or itching or icterus GU: dysuria, change in color of urine, urgency or frequency. flank pain, hematuria  MS: joint pain or swelling. decreased range of motion  Psych: change in mood or affect. depression or anxiety.  Neuro: difficulty with speech, weakness, numbness, ataxia    SUBJECTIVE:    VITAL  SIGNS: BP (!) 178/93   Pulse 96   Temp 98.3 F (36.8 C) (Oral)   Resp 19   SpO2 96%   HEMODYNAMICS:    VENTILATOR SETTINGS:    INTAKE / OUTPUT: No intake/output data recorded.  PHYSICAL EXAMINATION: General:  Young adult male of normal body habitus Neuro:  Drowsy (had received ativan), alert to voice. Answers questions appropriately, but limited cooperation.  HEENT:  [place]/AT, PERRL, minimal  JVD Cardiovascular:  Tachy, regular, no MRG Lungs:  Clear Abdomen:  Soft, non-tender, non-distended Musculoskeletal:  No acute deformity or ROM limitation Skin:  Grossly intact  LABS:  BMET  Recent Labs Lab 06/12/17 0903  NA 140  K 3.4*  CL 104  CO2 24  BUN 22*  CREATININE 3.11*  GLUCOSE 99    Electrolytes  Recent Labs Lab 06/12/17 0903  CALCIUM 9.2    CBC  Recent Labs Lab 06/12/17 0903  WBC 6.3  HGB 12.4*  HCT 37.7*  PLT 262    Coag's No results for input(s): APTT, INR in the last 168 hours.  Sepsis Markers No results for input(s): LATICACIDVEN, PROCALCITON, O2SATVEN in the last 168 hours.  ABG No results for input(s): PHART, PCO2ART, PO2ART in the last 168 hours.  Liver Enzymes  Recent Labs Lab 06/12/17 0903  AST 23  ALT 18  ALKPHOS 88  BILITOT 1.0  ALBUMIN 4.2    Cardiac Enzymes No results for input(s): TROPONINI, PROBNP in the last 168 hours.  Glucose No results for input(s): GLUCAP in the last 168 hours.  Imaging Ct Head Wo Contrast  Result Date: 06/12/2017 CLINICAL DATA:  Left occipital headache for 1 day with nausea and vomiting. Hypertension. Dizziness. EXAM: CT HEAD WITHOUT CONTRAST TECHNIQUE: Contiguous axial images were obtained from the base of the skull through the vertex without intravenous contrast. COMPARISON:  None. FINDINGS: Brain: The ventricles are normal in size and configuration. There is no intracranial mass, hemorrhage, extra-axial fluid collection, or midline shift. Gray-white compartments are normal. No evident acute infarct. Vascular: There is no hyperdense vessel. There is no appreciable vascular calcification. Skull: Bony calvarium appears intact. Sinuses/Orbits: There is mucosal thickening in both maxillary antra, considerably more on the left than on the right. There is mucosal thickening and opacification in multiple ethmoid air cells bilaterally. There is opacification in both anterior sphenoid sinus regions,  more severe on the right than on the left. There is mild mucosal thickening in the inferior, medial aspects of each frontal sinus. There is rightward deviation of the nasal septum. Orbits appear symmetric bilaterally. Other: Visualized mastoid air cells are clear. IMPRESSION: Multifocal paranasal sinus disease. Deviated nasal septum toward the right. No intracranial mass, hemorrhage, or extra-axial fluid collection. Gray-white compartments appear normal. Electronically Signed   By: Lowella Grip III M.D.   On: 06/12/2017 08:37    STUDIES:  CT head 9/10 > Multifocal paranasal sinus disease. Deviated nasal septum toward the right. No intracranial mass, hemorrhage, or extra-axial fluid collection. Gray-white compartments appear normal.  MRI Brain 9/10 >  CULTURES:   ANTIBIOTICS:   SIGNIFICANT EVENTS: Admit 9/10 >  LINES/TUBES:   DISCUSSION: 41 year old male with uncontrolled hypertension admitted 9/10 with hypertensive urgency requiring nicardipine infusion.   ASSESSMENT / PLAN:  Hypertensive urgency - he reports taking home antihypertensive medications as prescribed.  - Continue nicardipine infusion - Cardiac monitoring in ICU setting - SBP goal 180-200 mmHg first 24 hours - Resume home oral medications once mental status improved. Hopefully 9/11 AM  Hypertensive encephalopathy - per neuro DDx includes reversible deficits secondary to cerebral vasospasm in the context of severe hypertension, multifocal cerebral lesions such as hypertensive microbleeds and PRES.  - neuro following - for MRI  CKD - Baseline creat 3.8 - repeat BMP in MA - consider nephrology involvement for outpatient eval.  Tobacco abuse disorder - Smoking cessation counseling  Hypokalemia - repeat BMP in AM  Dispo ICU Diet sips with meds until more awake VTE sub q heparin SUP PO protnoix  Georgann Housekeeper, AGACNP-BC [facility] Pulmonology/Critical Care Pager [phone] or [phone]  06/12/2017  1:31 PM

## 2017-06-12 NOTE — ED Triage Notes (Signed)
Per EMS: pt from home c/o HA starting at 0300; pt took ASA; pt sts some dizziness with N/V/D; pt with hx of htn and last BP 250/135; pt given 4mg  ODT zofran; 18g L hand

## 2017-06-12 NOTE — ED Notes (Signed)
Patient transported to MRI 

## 2017-06-12 NOTE — Consult Note (Signed)
Referring Physician: Dr. Billy Fischer    Chief Complaint: Severe headache, right sided vision loss, paresthesias  HPI: Stanley [name] is an 41 y.o. male presenting from home via EMS for severe headache which began on awakening Sunday. Early this AM at 0300, the headache woke him up from sleep. There was associated dizziness with N/V/D. He regularly monitors his BP at home; he stated to ED staff that his last BP was 250/135. SBP in the ED was > 200. He endorsed right sided vision loss and paresthesias to ED staff but denied focal weakness.   He states that he has had fever and chills for about 2 days.   Of note, he was admitted on 09/26/16 with shortness of breath related to hypertensive emergency with acute kidney injury. Echocardiogram had demonstrated severe concentric hypertrophy with a normal ejection fraction. His blood pressure in the emergency department at the time of the December admission was 265/168. Problem list at the time of his January 2018 follow up included the following: Chronic dHF/HFpEF, AKI on CKD stage 3, HLD and smoking.           .    Past Medical History:  Diagnosis Date  . Hypertension   . Tobacco abuse     History reviewed. No pertinent surgical history.  Family History  Problem Relation Age of Onset  . Hypertension Mother   . Hypertension Brother   . Diabetes type II Brother    Social History:  reports that he has been smoking.  He has never used smokeless tobacco. He reports that he drinks alcohol. He reports that he does not use drugs.  Allergies:  Allergies  Allergen Reactions  . Lactose Intolerance (Gi) Nausea And Vomiting  . Other Swelling    All beans  . Nitroglycerin Anxiety and Other (See Comments)    Pain and feels like he is on fire    Medications: Prior to Admission:    ROS: 10/10 throbbing occipital headache with radiation to temples. Positive for photophobia. Other ROS as per HPI.  Physical Examination: Blood pressure (!) 235/128, pulse  78, temperature 98.3 F (36.8 C), temperature source Oral, resp. rate 18, SpO2 97 %.  General: Pained affect HEENT: Neck supple without meningismus. Some tenderness to palpation posterior paraspinal muscles of neck.  Lungs: Respirations unlabored Ext: No edema  Neurologic Examination: Mental Status: Drowsy. Delayed responses to orientation questions; oriented to self, city, day and year. Speech fluent with intact comprehension and naming. Repetition intact. Able to follow all commands without difficulty. Cranial Nerves:  II:  Visual fields intact in all 4 quadrants with both eyes tested individually. No visual extinction with double simultaneous stimulation. Visual acuity 20/50 bilaterally. No papilledema on funduscopic exam.  III,IV, VI: Ptosis not present. EOMI without nystagmus.  V,VII: Smile symmetric. Reacts to FT bilaterally VIII: hearing intact to questions and commands IX,X: No hoarseness XI: No asymmetry XII: midline tongue extension  Motor: RUE: 5/5 proximal and distal LUE: 5/5 proximal and distal RLE: 4+/5 knee extension and ankle PF/DF with some giveway noted LLE: 5/5 Normal tone throughout; no atrophy noted Sensory:  Normal temp sensation bilateral upper ext. Decreased FT sensation to medial aspect of left forearm. Decreased temp sensation to medial aspect of right foot. FT intact bilateral lower extremities.  Deep Tendon Reflexes:  Bilateral biceps/brachioradialis: 1+ Right patella 2+, left patella 3+ Bilateral achilles 2+ Plantars: Right: downgoing   Left: upgoing Cerebellar: No ataxia with FNF bilaterally Gait: Deferred  No results found for this  or any previous visit (from the past 48 hour(s)). No results found.  Assessment: 41 y.o. male presenting with complaints of right sided visual loss, paresthesias and severe headache in the context of severe hypertension 1. CT head negative for acute abnormality.  2. Patchy motor and sensory findings on exam, as  documented above. DDx includes reversible deficits secondary to cerebral vasospasm in the context of severe hypertension, multifocal cerebral lesions such as hypertensive microbleeds and PRES.  3. CKD stage 3 4. Chronic dHF/HFpEF 5. HLD 6. Smoking  Plan: 1. Acute management of BP in the ED with nicardipine drip 2. MRI brain to assess for possible PRES and/or cerebral microbleeds 3. MRA brain to assess for possible cerebral vasospasm 4. Tox screen.   @Electronically  signed: Dr. Kerney Elbe  06/12/2017, 8:33 AM

## 2017-06-12 NOTE — Progress Notes (Signed)
Stanley Soho MD paged regarding a diet for the patient and further orders. Orders received.    Stanley [name]

## 2017-06-12 NOTE — ED Notes (Signed)
Got patient hooked up to the monitor into a gown patient is resting with call bell in reach

## 2017-06-12 NOTE — ED Notes (Signed)
Neurology at bedside.

## 2017-06-13 ENCOUNTER — Encounter (HOSPITAL_COMMUNITY): Payer: Self-pay | Admitting: General Practice

## 2017-06-13 LAB — BASIC METABOLIC PANEL
ANION GAP: 9 (ref 5–15)
BUN: 17 mg/dL (ref 6–20)
CALCIUM: 8.6 mg/dL — AB (ref 8.9–10.3)
CO2: 24 mmol/L (ref 22–32)
Chloride: 103 mmol/L (ref 101–111)
Creatinine, Ser: 2.9 mg/dL — ABNORMAL HIGH (ref 0.61–1.24)
GFR, EST AFRICAN AMERICAN: 30 mL/min — AB (ref >60)
GFR, EST NON AFRICAN AMERICAN: 26 mL/min — AB (ref >60)
Glucose, Bld: 96 mg/dL (ref 65–99)
Potassium: 2.9 mmol/L — ABNORMAL LOW (ref 3.5–5.1)
Sodium: 136 mmol/L (ref 135–145)

## 2017-06-13 LAB — CBC
HEMATOCRIT: 35.1 % — AB (ref 39.0–52.0)
Hemoglobin: 11.8 g/dL — ABNORMAL LOW (ref 13.0–17.0)
MCH: 28.7 pg (ref 26.0–34.0)
MCHC: 33.6 g/dL (ref 30.0–36.0)
MCV: 85.4 fL (ref 78.0–100.0)
PLATELETS: 270 10*3/uL (ref 150–400)
RBC: 4.11 MIL/uL — ABNORMAL LOW (ref 4.22–5.81)
RDW: 14.6 % (ref 11.5–15.5)
WBC: 4.5 10*3/uL (ref 4.0–10.5)

## 2017-06-13 LAB — HIV ANTIBODY (ROUTINE TESTING W REFLEX): HIV Screen 4th Generation wRfx: NONREACTIVE

## 2017-06-13 LAB — MAGNESIUM: MAGNESIUM: 2 mg/dL (ref 1.7–2.4)

## 2017-06-13 LAB — MRSA PCR SCREENING: MRSA BY PCR: NEGATIVE

## 2017-06-13 MED ORDER — AMLODIPINE BESYLATE 5 MG PO TABS
5.0000 mg | ORAL_TABLET | Freq: Every day | ORAL | Status: DC
  Administered 2017-06-13: 5 mg via ORAL
  Filled 2017-06-13: qty 1

## 2017-06-13 MED ORDER — POTASSIUM CHLORIDE CRYS ER 20 MEQ PO TBCR
40.0000 meq | EXTENDED_RELEASE_TABLET | Freq: Once | ORAL | Status: AC
  Administered 2017-06-13: 40 meq via ORAL
  Filled 2017-06-13: qty 2

## 2017-06-13 MED ORDER — NICOTINE 14 MG/24HR TD PT24
14.0000 mg | MEDICATED_PATCH | Freq: Every day | TRANSDERMAL | Status: DC
  Administered 2017-06-13 – 2017-06-16 (×4): 14 mg via TRANSDERMAL
  Filled 2017-06-13 (×4): qty 1

## 2017-06-13 MED ORDER — PNEUMOCOCCAL VAC POLYVALENT 25 MCG/0.5ML IJ INJ
0.5000 mL | INJECTION | INTRAMUSCULAR | Status: DC
  Filled 2017-06-13: qty 0.5

## 2017-06-13 MED ORDER — HYDRALAZINE HCL 20 MG/ML IJ SOLN
10.0000 mg | INTRAMUSCULAR | Status: DC | PRN
  Administered 2017-06-14 (×2): 10 mg via INTRAVENOUS
  Filled 2017-06-13 (×3): qty 1

## 2017-06-13 MED ORDER — ENSURE ENLIVE PO LIQD
237.0000 mL | Freq: Two times a day (BID) | ORAL | Status: DC
  Administered 2017-06-13: 237 mL via ORAL

## 2017-06-13 MED ORDER — ATORVASTATIN CALCIUM 40 MG PO TABS
40.0000 mg | ORAL_TABLET | Freq: Every day | ORAL | Status: DC
  Administered 2017-06-13 – 2017-06-15 (×3): 40 mg via ORAL
  Filled 2017-06-13 (×3): qty 1

## 2017-06-13 NOTE — Progress Notes (Signed)
Although pt has cont pulse ox ordered, it appears no machines are available at this time. Unit secretary will try to order again later on.

## 2017-06-13 NOTE — Progress Notes (Signed)
MRI brain obtained and shows no acute stroke or PRES. Sx likely secondary to elevated BP and looking through the vitals his BP is still to high. Goal BP is 130-140/90 consistently. His BP has been labile which will allow for continued encephalopathy and possible creation of PRES.   No further recommendations per neurology. Call with questions. Neurology S/O  Etta Quill PA-C [facility] [phone]  M-F  (8:30 am- 4 PM)  06/13/2017, 9:10 AM

## 2017-06-13 NOTE — Progress Notes (Signed)
PULMONARY / CRITICAL CARE MEDICINE   Name: Stanley [name] MRN: [id number] DOB: Oct 21, 1975    ADMISSION DATE:  06/12/2017 CONSULTATION DATE:  9/10  REFERRING MD:  Dr. Billy Fischer  CHIEF COMPLAINT:  SOB  HISTORY OF PRESENT ILLNESS:  41 y.o. male with past medical history of uncontrolled hypertension. Presented to the emergency department 9/10 with complaints of headache and visual field disturbances for the preceding 24 hours. Upon arrival to the emergency department his systolic pressure was 20 and 50 mmHg. He was initially given when necessary hydralazine and hypotension was refractory so he was started on nicardipine infusion. CT scan of the head was negative for acute abnormalities. His evaluate by neurology in the emergency department his differential includes cerebral vasospasm, multifocal cerebral lesions, or PRES. PCCM asked to admit.   SUBJECTIVE:  No acute events overnight. Weaned off Cardene shortly after midnight. Patient denies any chest pain or pressure. He denies any dyspnea or cough.  REVIEW OF SYSTEMS: No subjective fever or chills. No abdominal pain or nausea. No headache or vision changes.  VITAL SIGNS: BP (!) 189/104   Pulse 84   Temp 99 F (37.2 C) (Oral)   Resp (!) 22   Wt 190 lb 11.2 oz (86.5 kg)   SpO2 97%   BMI 25.86 kg/m   HEMODYNAMICS:    VENTILATOR SETTINGS:    INTAKE / OUTPUT: I/O last 3 completed shifts: In: 1964.8 [P.O.:120; I.V.:1844.8] Out: 2050 [Urine:2050]  PHYSICAL EXAMINATION: General:  Awake. Alert. No acute distress. Laying in bed fully recumbent watching TV.  Integument:  Warm & dry. No rash on exposed skin. HEENT:  Moist mucus membranes. No scleral injection or icterus.  Cardiovascular:  Regular rate. No appreciable JVD.  Pulmonary: Normal work of breathing on room air. Clear to auscultation bilaterally. Abdomen: Soft. Normal bowel sounds. Nondistended. Grossly nontender. Musculoskeletal:  Normal bulk and tone. Hand grip and foot  flexion strength 5/5 bilaterally. No joint deformity or effusion appreciated. Neurological: Cranial nerves grossly intact. Oriented to person, place, season, and president. No meningismus. Grossly nonfocal.  LABS:  BMET  Recent Labs Lab 06/12/17 0903 06/13/17 0313  NA 140 136  K 3.4* 2.9*  CL 104 103  CO2 24 24  BUN 22* 17  CREATININE 3.11* 2.90*  GLUCOSE 99 96    Electrolytes  Recent Labs Lab 06/12/17 0903 06/13/17 0313  CALCIUM 9.2 8.6*    CBC  Recent Labs Lab 06/12/17 0903 06/13/17 0313  WBC 6.3 4.5  HGB 12.4* 11.8*  HCT 37.7* 35.1*  PLT 262 270    Coag's No results for input(s): APTT, INR in the last 168 hours.  Sepsis Markers No results for input(s): LATICACIDVEN, PROCALCITON, O2SATVEN in the last 168 hours.  ABG No results for input(s): PHART, PCO2ART, PO2ART in the last 168 hours.  Liver Enzymes  Recent Labs Lab 06/12/17 0903  AST 23  ALT 18  ALKPHOS 88  BILITOT 1.0  ALBUMIN 4.2    Cardiac Enzymes No results for input(s): TROPONINI, PROBNP in the last 168 hours.  Glucose No results for input(s): GLUCAP in the last 168 hours.  Imaging Ct Head Wo Contrast  Result Date: 06/12/2017 CLINICAL DATA:  Left occipital headache for 1 day with nausea and vomiting. Hypertension. Dizziness. EXAM: CT HEAD WITHOUT CONTRAST TECHNIQUE: Contiguous axial images were obtained from the base of the skull through the vertex without intravenous contrast. COMPARISON:  None. FINDINGS: Brain: The ventricles are normal in size and configuration. There is no intracranial mass, hemorrhage,  extra-axial fluid collection, or midline shift. Gray-white compartments are normal. No evident acute infarct. Vascular: There is no hyperdense vessel. There is no appreciable vascular calcification. Skull: Bony calvarium appears intact. Sinuses/Orbits: There is mucosal thickening in both maxillary antra, considerably more on the left than on the right. There is mucosal thickening and  opacification in multiple ethmoid air cells bilaterally. There is opacification in both anterior sphenoid sinus regions, more severe on the right than on the left. There is mild mucosal thickening in the inferior, medial aspects of each frontal sinus. There is rightward deviation of the nasal septum. Orbits appear symmetric bilaterally. Other: Visualized mastoid air cells are clear. IMPRESSION: Multifocal paranasal sinus disease. Deviated nasal septum toward the right. No intracranial mass, hemorrhage, or extra-axial fluid collection. Gray-white compartments appear normal. Electronically Signed   By: Lowella Grip III M.D.   On: 06/12/2017 08:37   Mr Virgel Paling WN Contrast  Result Date: 06/12/2017 CLINICAL DATA:  Initial evaluation for acute right-sided visual loss, paresthesias, severe headache with severe hypertension. EXAM: MRI HEAD WITHOUT CONTRAST MRA HEAD WITHOUT CONTRAST TECHNIQUE: Multiplanar, multiecho pulse sequences of the brain and surrounding structures were obtained without intravenous contrast. Angiographic images of the head were obtained using MRA technique without contrast. COMPARISON:  Comparison made with prior CT from earlier the same day. FINDINGS: MRI HEAD FINDINGS Brain:  Study moderately degraded by motion artifact. Cerebral volume within normal limits for age. Suspected few scattered foci of T2/FLAIR hyperintensity involving the supratentorial cerebral white matter, nonspecific, but most like related to chronic microvascular ischemic changes. No definite signal changes to suggest underlying PRES or other acute abnormality. No evidence for acute or subacute ischemia. Gray-white matter differentiation maintained. Probable small remote lacunar infarct noted within the right paracentral pons. No other definite evidence for remote ischemia. No acute or chronic intracranial hemorrhage. No evidence for hypertensive encephalopathy. No mass lesion, midline shift or mass effect. No  hydrocephalus. No extra-axial fluid collection. Major dural sinuses are grossly patent. Vascular: Major intracranial vascular flow voids grossly maintained. Skull and upper cervical spine: Craniocervical junction within normal limits. Visualized upper cervical spine unremarkable. Bone marrow signal intensity within normal limits. No scalp soft tissue abnormality. Sinuses/Orbits: Globes and orbital soft tissues grossly within normal limits. Mild-to-moderate mucosal thickening noted within the sphenoid, ethmoidal, and maxillary sinuses. No air-fluid level to suggest acute sinusitis. No mastoid effusion. Inner ear structures grossly normal. Other: None. MRA HEAD FINDINGS ANTERIOR CIRCULATION: Study moderately degraded by motion artifact. Distal cervical segments of the internal carotid arteries are patent with antegrade flow. Petrous, cavernous, and supraclinoid segments patent without flow-limiting stenosis. ICA termini patent. A1 segments grossly patent. Anterior communicating artery grossly normal. Anterior cerebral arteries patent to their distal aspects. No obvious stenosis, although evaluation limited by motion. M1 segments patent without obvious stenosis. Motion limits evaluation of the distal MCA branches which are grossly symmetric and well perfused. POSTERIOR CIRCULATION: Vertebral artery's patent to the vertebrobasilar junction without stenosis. Left vertebral artery dominant. Posterior inferior cerebral arteries patent proximally. Basilar artery patent to its distal aspect without obvious stenosis. Superior cerebral arteries patent proximally. Both of the posterior cerebral artery supplied via the basilar and are widely patent to their distal aspects. No aneurysm. No obvious evidence for vasospasm, although evaluation limited on this motion degraded exam. IMPRESSION: MRI HEAD IMPRESSION: 1. Motion degraded study. 2. No definite acute intracranial process identified. 3. Remote subcentimeter lacunar right  pontine infarct. MRA HEAD IMPRESSION: 1. Motion degraded study. 2. Grossly negative intracranial  MRA. No large vessel occlusion. No obvious flow-limiting stenosis. No obvious evidence for vasospasm, although evaluation fairly limited on this motion degraded exam. Electronically Signed   By: Jeannine Boga M.D.   On: 06/12/2017 15:56   Mr Brain Wo Contrast  Result Date: 06/12/2017 CLINICAL DATA:  Initial evaluation for acute right-sided visual loss, paresthesias, severe headache with severe hypertension. EXAM: MRI HEAD WITHOUT CONTRAST MRA HEAD WITHOUT CONTRAST TECHNIQUE: Multiplanar, multiecho pulse sequences of the brain and surrounding structures were obtained without intravenous contrast. Angiographic images of the head were obtained using MRA technique without contrast. COMPARISON:  Comparison made with prior CT from earlier the same day. FINDINGS: MRI HEAD FINDINGS Brain:  Study moderately degraded by motion artifact. Cerebral volume within normal limits for age. Suspected few scattered foci of T2/FLAIR hyperintensity involving the supratentorial cerebral white matter, nonspecific, but most like related to chronic microvascular ischemic changes. No definite signal changes to suggest underlying PRES or other acute abnormality. No evidence for acute or subacute ischemia. Gray-white matter differentiation maintained. Probable small remote lacunar infarct noted within the right paracentral pons. No other definite evidence for remote ischemia. No acute or chronic intracranial hemorrhage. No evidence for hypertensive encephalopathy. No mass lesion, midline shift or mass effect. No hydrocephalus. No extra-axial fluid collection. Major dural sinuses are grossly patent. Vascular: Major intracranial vascular flow voids grossly maintained. Skull and upper cervical spine: Craniocervical junction within normal limits. Visualized upper cervical spine unremarkable. Bone marrow signal intensity within normal limits.  No scalp soft tissue abnormality. Sinuses/Orbits: Globes and orbital soft tissues grossly within normal limits. Mild-to-moderate mucosal thickening noted within the sphenoid, ethmoidal, and maxillary sinuses. No air-fluid level to suggest acute sinusitis. No mastoid effusion. Inner ear structures grossly normal. Other: None. MRA HEAD FINDINGS ANTERIOR CIRCULATION: Study moderately degraded by motion artifact. Distal cervical segments of the internal carotid arteries are patent with antegrade flow. Petrous, cavernous, and supraclinoid segments patent without flow-limiting stenosis. ICA termini patent. A1 segments grossly patent. Anterior communicating artery grossly normal. Anterior cerebral arteries patent to their distal aspects. No obvious stenosis, although evaluation limited by motion. M1 segments patent without obvious stenosis. Motion limits evaluation of the distal MCA branches which are grossly symmetric and well perfused. POSTERIOR CIRCULATION: Vertebral artery's patent to the vertebrobasilar junction without stenosis. Left vertebral artery dominant. Posterior inferior cerebral arteries patent proximally. Basilar artery patent to its distal aspect without obvious stenosis. Superior cerebral arteries patent proximally. Both of the posterior cerebral artery supplied via the basilar and are widely patent to their distal aspects. No aneurysm. No obvious evidence for vasospasm, although evaluation limited on this motion degraded exam. IMPRESSION: MRI HEAD IMPRESSION: 1. Motion degraded study. 2. No definite acute intracranial process identified. 3. Remote subcentimeter lacunar right pontine infarct. MRA HEAD IMPRESSION: 1. Motion degraded study. 2. Grossly negative intracranial MRA. No large vessel occlusion. No obvious flow-limiting stenosis. No obvious evidence for vasospasm, although evaluation fairly limited on this motion degraded exam. Electronically Signed   By: Jeannine Boga M.D.   On: 06/12/2017  15:56    STUDIES:  UDS 9/10:  Positive THC CT HEAD W/O 9/10:  Multifocal paranasal sinus disease. Deviated nasal septum toward the right. No intracranial mass, hemorrhage, or extra-axial fluid collection. Gray-white compartments appear normal. MRI BRAIN 9/10: 1. Motion degraded study. 2. No definite acute intracranial process identified. 3. Remote subcentimeter lacunar right pontine infarct. MRA HEAD 9/10:  1. Motion degraded study. 2. Grossly negative intracranial MRA. No large vessel occlusion. No obvious flow-limiting  stenosis. No obvious evidence for vasospasm, although evaluation fairly limited on this motion degraded exam.  MICROBIOLOGY: MRSA PCR 9/10:  Negative   ANTIBIOTICS: None.  SIGNIFICANT EVENTS: 09/10 - Admit & placed on Cardene drip 09/11 - Cardene drip stopped at 00:17  LINES/TUBES: PIV  ASSESSMENT / PLAN:  41 y.o. male admitted with acute encephalopathy and hypertensive emergency. Patient has been weaned off his nicardipine infusion overnight and blood pressure seems to be at goal. I have adjusted his home Norvasc dose given his good blood pressure control at this time. We discussed his findings on MRI showing an old lacunar infarct. I also stressed the need for good blood pressure control. Patient's encephalopathy seems to have improved and was likely secondary to his hypertension.  1. Hypertensive emergency: Resolving. Continuing telemetry monitoring. Goal systolic blood pressure 242-683. Continuing patient on Norvasc 5 mg by mouth daily, labetalol 400 mg by mouth twice a day, & hydralazine 100 mg by mouth 3 times a day. Hydralazine 10 mg IV ordered when necessary for systolic blood pressure greater than 200. Continuing to monitor vitals per unit protocol. Discontinuing nicardipine infusion. Holding home Clonidine & Imdur. 2. Acute encephalopathy: Likely secondary to hypertension. Neurology following. Resolved. 3. Hypokalemia: Replaced with potassium chloride 80 mEq  by mouth. Checking stat magnesium level. Repeat electrolyte panel in the morning. 4. Chronic renal failure Stage 3: Baseline creatinine 3.8. Holding Lasix at this time. 5. 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 Holding aspirin at this time. Neurology following. 6. Tobacco use disorder: Nicotine patch 14 mg/24hr continuing. 7. Chronic diastolic congestive heart failure: No signs of acute exacerbation. Holding on Lasix. Continuing blood pressure control and Lipitor.  Prophylaxis:  D/C Protonix. SCDs & Heparin [place] q8hr. Diet: Low-sodium diet. Code Status: Full code per previous physician discussions. Disposition: Consulting PT. Transferring patient to telemetry bed. Family Update:  Patient updated at the time of my exam.  I have spent a total of 37 minutes of time today caring for the patient, reviewing the patient's electronic medical record, and with more than 50% of that time spent coordinating care with the patient as well as reviewing the continuing plan of care with the patient at bedside.  TRH to assume care & PCCM off as of 9/12.  Sonia Baller Ashok Cordia, M.D. [facility] Pulmonary & Critical Care Pager:  [phone] After 3pm or if no response, call [phone] 06/13/2017 8:29 AM

## 2017-06-13 NOTE — Evaluation (Signed)
Physical Therapy Evaluation Patient Details Name: Stanley [name] MRN: [id number] DOB: 1976/09/12 Today's Date: 06/13/2017   History of Present Illness  41 year old with uncontrolled hypertension, noncompliance admitted with headache, visual field disturbance. Noted to be in hypertensive emergency started on Cardene drip.  Clinical Impression  Patient evaluated by Physical Therapy with no further acute PT needs identified. All education has been completed and the patient has no further questions. Pt independent with mobility. Had pt ambulate 500' with varying pace as well as climb flight of stairs to determine exercise tolerance. O2 sats 97%, HR 79 bpm after ambulation, BP 187/129. Pt asymptomatic. Pt instructed to ambulate in has as desired.  PT is signing off. Thank you for this referral.     Follow Up Recommendations No PT follow up    Equipment Recommendations  None recommended by PT    Recommendations for Other Services       Precautions / Restrictions Precautions Precautions: None Restrictions Weight Bearing Restrictions: No      Mobility  Bed Mobility Overal bed mobility: Independent                Transfers Overall transfer level: Independent                  Ambulation/Gait Ambulation/Gait assistance: Independent Ambulation Distance (Feet): 500 Feet Assistive device: None Gait Pattern/deviations: WFL(Within Functional Limits) Gait velocity: WFL Gait velocity interpretation: at or above normal speed for age/gender General Gait Details: had pt ambulate at different paces for increased distance for exercise tolerance. O2 sats remained stable 97%, HR 79 bpm after walking, BP 187/129. Pt asymptomatic with BP  Stairs Stairs: Yes Stairs assistance: Independent Stair Management: No rails;Alternating pattern;Forwards Number of Stairs: 10    Wheelchair Mobility    Modified Rankin (Stroke Patients Only)       Balance Overall balance assessment: No  apparent balance deficits (not formally assessed)                                           Pertinent Vitals/Pain Pain Assessment: No/denies pain    Home Living Family/patient expects to be discharged to:: Private residence Living Arrangements: Parent;Other relatives Available Help at Discharge: Family;Available PRN/intermittently Type of Home: House Home Access: Level entry     Home Layout: One level Home Equipment: None Additional Comments: pt lives with his mother and brother and brother's children. 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, 6 days/ week    Prior Function Level of Independence: Independent         Comments: smoker     Hand Dominance        Extremity/Trunk Assessment   Upper Extremity Assessment Upper Extremity Assessment: Overall WFL for tasks assessed    Lower Extremity Assessment Lower Extremity Assessment: Overall WFL for tasks assessed    Cervical / Trunk Assessment Cervical / Trunk Assessment: Normal  Communication   Communication: No difficulties  Cognition Arousal/Alertness: Awake/alert Behavior During Therapy: WFL for tasks assessed/performed Overall Cognitive Status: Within Functional Limits for tasks assessed                                        General Comments General comments (skin integrity, edema, etc.): discussed smoking cessation, managing stress and anxiety, taking control of his own medical care (  managing his own meds, taking his BP and communicating with physician)    Exercises     Assessment/Plan    PT Assessment Patent does not need any further PT services  PT Problem List         PT Treatment Interventions      PT Goals (Current goals can be found in the Care Plan section)  Acute Rehab PT Goals Patient Stated Goal: get back to work and stop having to come to hospital PT Goal Formulation: All assessment and education complete, DC therapy    Frequency     Barriers to  discharge        Co-evaluation               AM-PAC PT "6 Clicks" Daily Activity  Outcome Measure Difficulty turning over in bed (including adjusting bedclothes, sheets and blankets)?: None Difficulty moving from lying on back to sitting on the side of the bed? : None Difficulty sitting down on and standing up from a chair with arms (e.g., wheelchair, bedside commode, etc,.)?: None Help needed moving to and from a bed to chair (including a wheelchair)?: None Help needed walking in hospital room?: None Help needed climbing 3-5 steps with a railing? : None 6 Click Score: 24    End of Session   Activity Tolerance: Patient tolerated treatment well Patient left: in chair;with call bell/phone within reach Nurse Communication: Mobility status PT Visit Diagnosis: Difficulty in walking, not elsewhere classified (R26.2)    Time: 9518-8416 PT Time Calculation (min) (ACUTE ONLY): 18 min   Charges:   PT Evaluation $PT Eval Low Complexity: 1 Low     PT G Codes:        Leighton Roach, PT  Acute Rehab Services  [place] 06/13/2017, 3:56 PM

## 2017-06-13 NOTE — Progress Notes (Signed)
MD aware of pt's persistant HTN.  MD ordered still OK to transfer to tele.  Pt asymptomatic.  Will continue to monitor closely and update as needed.   Updated 5W RN of this information as well.

## 2017-06-13 NOTE — Care Management Note (Signed)
Case Management Note  Patient Details  Name: STEVENS MAGWOOD MRN: [id number] Date of Birth: January 07, 1976  Subjective/Objective:    From home presents with hypertensive emergency with visual field disturbance and headache, was started on nicardipine infusion, now weaned off,  CT of head negative , MRI negative.  He has no insurance or no PCP.  NCM scheduled an apt at the [facility] for 9/18 at 9:45 for patient, also he will need medication ast at discharge, he will need to go to the [facility] to get medications.  NCM gave him the brochure for  [facility].               Action/Plan: NCM will follow for dc needs.   Expected Discharge Date:                  Expected Discharge Plan:  Home/Self Care  In-House Referral:     Discharge planning Services  CM Consult, Follow-up appt scheduled, Medication Assistance, [facility]  Post Acute Care Choice:    Choice offered to:     DME Arranged:    DME Agency:     HH Arranged:    HH Agency:     Status of Service:  Completed, signed off  If discussed at [employer] of Stay Meetings, dates discussed:    Additional Comments:  Zenon Mayo, RN 06/13/2017, 12:24 PM

## 2017-06-14 LAB — CBC WITH DIFFERENTIAL/PLATELET
BASOS PCT: 1 %
Basophils Absolute: 0.1 10*3/uL (ref 0.0–0.1)
EOS ABS: 0.1 10*3/uL (ref 0.0–0.7)
EOS PCT: 2 %
HCT: 38 % — ABNORMAL LOW (ref 39.0–52.0)
HEMOGLOBIN: 13 g/dL (ref 13.0–17.0)
LYMPHS ABS: 1.7 10*3/uL (ref 0.7–4.0)
Lymphocytes Relative: 37 %
MCH: 29.3 pg (ref 26.0–34.0)
MCHC: 34.2 g/dL (ref 30.0–36.0)
MCV: 85.8 fL (ref 78.0–100.0)
MONO ABS: 0.4 10*3/uL (ref 0.1–1.0)
MONOS PCT: 10 %
NEUTROS PCT: 50 %
Neutro Abs: 2.3 10*3/uL (ref 1.7–7.7)
Platelets: 282 10*3/uL (ref 150–400)
RBC: 4.43 MIL/uL (ref 4.22–5.81)
RDW: 15.1 % (ref 11.5–15.5)
WBC: 4.6 10*3/uL (ref 4.0–10.5)

## 2017-06-14 LAB — RENAL FUNCTION PANEL
Albumin: 3.7 g/dL (ref 3.5–5.0)
Anion gap: 7 (ref 5–15)
BUN: 20 mg/dL (ref 6–20)
CO2: 25 mmol/L (ref 22–32)
Calcium: 9 mg/dL (ref 8.9–10.3)
Chloride: 104 mmol/L (ref 101–111)
Creatinine, Ser: 3.29 mg/dL — ABNORMAL HIGH (ref 0.61–1.24)
GFR calc Af Amer: 26 mL/min — ABNORMAL LOW
GFR calc non Af Amer: 22 mL/min — ABNORMAL LOW
Glucose, Bld: 78 mg/dL (ref 65–99)
Phosphorus: 3.5 mg/dL (ref 2.5–4.6)
Potassium: 3.8 mmol/L (ref 3.5–5.1)
Sodium: 136 mmol/L (ref 135–145)

## 2017-06-14 LAB — MAGNESIUM: Magnesium: 2.1 mg/dL (ref 1.7–2.4)

## 2017-06-14 MED ORDER — CLONIDINE HCL 0.2 MG PO TABS
0.3000 mg | ORAL_TABLET | Freq: Three times a day (TID) | ORAL | Status: DC
  Administered 2017-06-14 – 2017-06-16 (×5): 0.3 mg via ORAL
  Filled 2017-06-14 (×5): qty 1

## 2017-06-14 MED ORDER — AMLODIPINE BESYLATE 10 MG PO TABS
10.0000 mg | ORAL_TABLET | Freq: Every day | ORAL | Status: DC
  Administered 2017-06-14 – 2017-06-16 (×3): 10 mg via ORAL
  Filled 2017-06-14 (×3): qty 1

## 2017-06-14 MED ORDER — NICARDIPINE HCL IN NACL 20-0.86 MG/200ML-% IV SOLN
3.0000 mg/h | INTRAVENOUS | Status: DC
  Filled 2017-06-14: qty 200

## 2017-06-14 MED ORDER — ZOLPIDEM TARTRATE 5 MG PO TABS
5.0000 mg | ORAL_TABLET | Freq: Once | ORAL | Status: AC
  Administered 2017-06-14: 5 mg via ORAL
  Filled 2017-06-14: qty 1

## 2017-06-14 MED ORDER — AMLODIPINE BESYLATE 10 MG PO TABS
10.0000 mg | ORAL_TABLET | Freq: Every day | ORAL | Status: DC
  Administered 2017-06-14: 10 mg via ORAL
  Filled 2017-06-14: qty 1

## 2017-06-14 MED ORDER — LABETALOL HCL 200 MG PO TABS
400.0000 mg | ORAL_TABLET | Freq: Three times a day (TID) | ORAL | Status: DC
  Administered 2017-06-14 – 2017-06-16 (×7): 400 mg via ORAL
  Filled 2017-06-14 (×8): qty 2

## 2017-06-14 MED ORDER — TRAZODONE HCL 50 MG PO TABS
50.0000 mg | ORAL_TABLET | Freq: Every day | ORAL | Status: DC
  Administered 2017-06-14 – 2017-06-15 (×2): 50 mg via ORAL
  Filled 2017-06-14 (×2): qty 1

## 2017-06-14 MED ORDER — PNEUMOCOCCAL VAC POLYVALENT 25 MCG/0.5ML IJ INJ
0.5000 mL | INJECTION | INTRAMUSCULAR | Status: AC
  Administered 2017-06-15: 0.5 mL via INTRAMUSCULAR
  Filled 2017-06-14: qty 0.5

## 2017-06-14 NOTE — Progress Notes (Signed)
Telephone report called and given to Nira Conn, RN ([place]). This RN will transfer pt to 2C17 at this time. Pt notified and aware of transfer.

## 2017-06-14 NOTE — Progress Notes (Signed)
Dr. Karleen [place] notified of current manual BP taken in bilateral arms. MD to place orders for transfer to stepdown unit to initiate gtt. Will administer PRN IV hydralazine in the mean time. Pt continues to deny any CP or headache, etc. Will continue to monitor.

## 2017-06-14 NOTE — Progress Notes (Signed)
Dr. Karleen [place] notified of elevated BP-220/118 manually. Informed MD scheduled meds administered and will also administer PRN 10mg  IV Hydralazine & recheck BP in 1 hour. No new orders received at this time. Will continue to monitor.

## 2017-06-14 NOTE — Progress Notes (Signed)
Dr. Karleen [place] updated on continued elevated BP s/p scheduled & PRN meds. Current BP-195/105. Call back received from MD stating continue to monitor/allow scheduled PO meds to take effect. No new orders received at this time. Will continue to monitor.

## 2017-06-14 NOTE — Progress Notes (Signed)
I came to assess the patient. He is completely asymptomatic. No chest pain, no weakness, numbness or mental status change. No headache. [id number]. He is on labetolol and hydralazine.   - stat clonidine then q8hr - stat amlodipine then daily - observe for next 3-4  hours closely if no significant improvement in BP then we will transfer to ICU. We will hold off for now.

## 2017-06-14 NOTE — Progress Notes (Addendum)
Informed by charge RN that pt transfer to 2C17 cancelled due to SDU not taking cardene gtt. Pt updated and aware. Report given to Victora, oncoming RN, & pt left in her care at this time in stable condition.

## 2017-06-14 NOTE — Progress Notes (Signed)
PROGRESS NOTE    Stanley [name]  ZCH:[id number] DOB: December 01, 1975 DOA: 06/12/2017 PCP: Ladell Pier, MD    Brief Narrative: 41 y.o. male with past medical history of uncontrolled hypertension. Presented to the emergency department 9/10 with complaints of headache and visual field disturbances, he was admitted for evaluation of PRES, stroke. .   Assessment & Plan:   Active Problems:   Hypertensive urgency   Hypertensive crisis:  Pt was started on amlodipine, hydralazine 100 mg TID, Labetolol 400 mg TID, pt allergic to nitroglycerin.  His BP continues to remain high around 200/110's.  Transfer the patient to step down started him on nicardipine gtt.    Stage 4 CKD;  creatinine at baseline.  Continue to monitor.     Headache and visual field disturbances: resolved.   Hyperlipidemia:  on lipitor.      DVT prophylaxis: heparin sq Code Status: full code.  Family Communication: none at bedside.  Disposition Plan: transfer to stepdown.    Consultants:   None.    Procedures: none.    Antimicrobials:none.    Subjective:reports having trouble sleeping.   Objective: Vitals:   06/14/17 1205 06/14/17 1505 06/14/17 1810 06/14/17 1812  BP: (!) 195/105 (!) 189/115 (!) 218/122 (!) 208/118  Pulse: 66 66    Resp:  20 18 18   Temp:  98.3 F (36.8 C)    TempSrc:  Oral    SpO2:  96%    Weight:      Height:        Intake/Output Summary (Last 24 hours) at 06/14/17 1848 Last data filed at 06/14/17 1800  Gross per 24 hour  Intake              600 ml  Output                0 ml  Net              600 ml   Filed Weights   06/12/17 2145 06/13/17 0500 06/14/17 0609  Weight: 88.4 kg (194 lb 14.2 oz) 86.5 kg (190 lb 11.2 oz) 90 kg (198 lb 6.4 oz)    Examination:  General exam: Appears calm and comfortable  Respiratory system: Clear to auscultation. Respiratory effort normal. Cardiovascular system: S1 & S2 heard, RRR. No JVD, murmurs, rubs, gallops or clicks. No pedal  edema. Gastrointestinal system: Abdomen is nondistended, soft and nontender. No organomegaly or masses felt. Normal bowel sounds heard. Central nervous system: Alert and oriented. No focal neurological deficits. Extremities: Symmetric 5 x 5 power. Skin: No rashes, lesions or ulcers Psychiatry: Judgement and insight appear normal. Mood & affect appropriate.     Data Reviewed: I have personally reviewed following labs and imaging studies  CBC:  Recent Labs Lab 06/12/17 0903 06/13/17 0313 06/14/17 0549  WBC 6.3 4.5 4.6  NEUTROABS 5.1  --  2.3  HGB 12.4* 11.8* 13.0  HCT 37.7* 35.1* 38.0*  MCV 85.7 85.4 85.8  PLT 262 270 287   Basic Metabolic Panel:  Recent Labs Lab 06/12/17 0903 06/13/17 0313 06/13/17 0856 06/14/17 0549  NA 140 136  --  136  K 3.4* 2.9*  --  3.8  CL 104 103  --  104  CO2 24 24  --  25  GLUCOSE 99 96  --  78  BUN 22* 17  --  20  CREATININE 3.11* 2.90*  --  3.29*  CALCIUM 9.2 8.6*  --  9.0  MG  --   --  2.0 2.1  PHOS  --   --   --  3.5   GFR: Estimated Creatinine Clearance: 34 mL/min (A) (by C-G formula based on SCr of 3.29 mg/dL (H)). Liver Function Tests:  Recent Labs Lab 06/12/17 0903 06/14/17 0549  AST 23  --   ALT 18  --   ALKPHOS 88  --   BILITOT 1.0  --   PROT 7.4  --   ALBUMIN 4.2 3.7   No results for input(s): LIPASE, AMYLASE in the last 168 hours. No results for input(s): AMMONIA in the last 168 hours. Coagulation Profile: No results for input(s): INR, PROTIME in the last 168 hours. Cardiac Enzymes: No results for input(s): CKTOTAL, CKMB, CKMBINDEX, TROPONINI in the last 168 hours. BNP (last 3 results) No results for input(s): PROBNP in the last 8760 hours. HbA1C: No results for input(s): HGBA1C in the last 72 hours. CBG: No results for input(s): GLUCAP in the last 168 hours. Lipid Profile: No results for input(s): CHOL, HDL, LDLCALC, TRIG, CHOLHDL, LDLDIRECT in the last 72 hours. Thyroid Function Tests: No results for  input(s): TSH, T4TOTAL, FREET4, T3FREE, THYROIDAB in the last 72 hours. Anemia Panel: No results for input(s): VITAMINB12, FOLATE, FERRITIN, TIBC, IRON, RETICCTPCT in the last 72 hours. Sepsis Labs: No results for input(s): PROCALCITON, LATICACIDVEN in the last 168 hours.  Recent Results (from the past 240 hour(s))  MRSA PCR Screening     Status: None   Collection Time: 06/12/17 10:04 PM  Result Value Ref Range Status   MRSA by PCR NEGATIVE NEGATIVE Final    Comment:        The GeneXpert MRSA Assay (FDA approved for NASAL specimens only), is one component of a comprehensive MRSA colonization surveillance program. It is not intended to diagnose MRSA infection nor to guide or monitor treatment for MRSA infections.          Radiology Studies: No results found.      Scheduled Meds: . atorvastatin  40 mg Oral q1800  . feeding supplement (ENSURE ENLIVE)  237 mL Oral BID BM  . heparin  5,000 Units Subcutaneous Q8H  . hydrALAZINE  100 mg Oral TID  . labetalol  400 mg Oral TID  . nicotine  14 mg Transdermal Daily  . pneumococcal 23 valent vaccine  0.5 mL Intramuscular Tomorrow-1000  . traZODone  50 mg Oral QHS   Continuous Infusions: . sodium chloride    . sodium chloride    . niCARDipine       LOS: 2 days    Time spent: 35 minutes    Hager Compston, MD [facility] Pager [phone]  If 7PM-7AM, please contact night-coverage [email or web address] Password [facility] 06/14/2017, 6:48 PM

## 2017-06-14 NOTE — Progress Notes (Signed)
Rapid repsoonse saying [facility] took dcision for cardene gtt. This cannot be done at SDU and asking for transfer to ICU. Per Magda Paganini RN at rapid response: patient is stable. Unclear BP baseline at home. He is not confused  Currently  Plan  - CCM APP V MD to eval at bedside prior to transfer   .Dr. Brand Males, M.D., [facility].C.P Pulmonary and Critical Care Medicine Staff Physician [facility] Pager: [phone], If no answer or between  15:00h - 7:00h: call [phone]  06/14/2017 9:04 PM       Vitals:   06/14/17 1505 06/14/17 1810 06/14/17 1812 06/14/17 2043  BP: (!) 189/115 (!) 218/122 (!) 208/118 (!) 204/117  Pulse: 66   68  Resp: 20 18 18    Temp: 98.3 F (36.8 C)     TempSrc: Oral     SpO2: 96%     Weight:      Height:

## 2017-06-15 DIAGNOSIS — I160 Hypertensive urgency: Secondary | ICD-10-CM

## 2017-06-15 LAB — BASIC METABOLIC PANEL
Anion gap: 10 (ref 5–15)
BUN: 25 mg/dL — AB (ref 6–20)
CHLORIDE: 101 mmol/L (ref 101–111)
CO2: 23 mmol/L (ref 22–32)
CREATININE: 3.68 mg/dL — AB (ref 0.61–1.24)
Calcium: 9 mg/dL (ref 8.9–10.3)
GFR calc Af Amer: 22 mL/min — ABNORMAL LOW (ref >60)
GFR calc non Af Amer: 19 mL/min — ABNORMAL LOW (ref >60)
Glucose, Bld: 112 mg/dL — ABNORMAL HIGH (ref 65–99)
POTASSIUM: 3.6 mmol/L (ref 3.5–5.1)
SODIUM: 134 mmol/L — AB (ref 135–145)

## 2017-06-15 MED ORDER — INFLUENZA VAC SPLIT QUAD 0.5 ML IM SUSY: 0.5000 mL | PREFILLED_SYRINGE | INTRAMUSCULAR | Status: DC

## 2017-06-15 NOTE — Progress Notes (Signed)
PROGRESS NOTE    Stanley [name]  VEL:[id number] DOB: April 20, 1976 DOA: 06/12/2017 PCP: Ladell Pier, MD    Brief Narrative: 41 y.o. male with past medical history of uncontrolled hypertension. Presented to the emergency department 9/10 with complaints of headache and visual field disturbances, he was admitted for evaluation of PRES, stroke. .   Assessment & Plan:   Active Problems:   Hypertensive urgency   Hypertensive crisis:  Pt was started on amlodipine, hydralazine 100 mg TID, Labetolol 400 mg TID, pt allergic to nitroglycerin.  Clonidine was added overnight by PCCM, his bp improved , and if his bp remains around 150/90 mmhg. Will plan to discharge him early in am.     Stage 4 CKD;  creatinine at baseline.  Continue to monitor.     Headache and visual field disturbances: resolved.   Hyperlipidemia:  on lipitor.      DVT prophylaxis: heparin sq Code Status: full code.  Family Communication: none at bedside.  Disposition Plan:d/c in am if bp is improved.   Consultants:   None.    Procedures: none.    Antimicrobials:none.    Subjective: No headache or nausea or vmiting.   Objective: Vitals:   06/15/17 1200 06/15/17 1300 06/15/17 1400 06/15/17 1500  BP: (!) 153/93 (!) 158/103 (!) 151/92 (!) 144/91  Pulse:      Resp:      Temp:      TempSrc:      SpO2:      Weight:      Height:        Intake/Output Summary (Last 24 hours) at 06/15/17 1849 Last data filed at 06/15/17 1414  Gross per 24 hour  Intake              640 ml  Output                0 ml  Net              640 ml   Filed Weights   06/12/17 2145 06/13/17 0500 06/14/17 0609  Weight: 88.4 kg (194 lb 14.2 oz) 86.5 kg (190 lb 11.2 oz) 90 kg (198 lb 6.4 oz)    Examination: unchanged from yesterday.   General exam: Appears calm and comfortable  Respiratory system: Clear to auscultation. Respiratory effort normal. Cardiovascular system: S1 & S2 heard, RRR. No JVD, murmurs, rubs, gallops  or clicks. No pedal edema. Gastrointestinal system: Abdomen is nondistended, soft and nontender. No organomegaly or masses felt. Normal bowel sounds heard. Central nervous system: Alert and oriented. No focal neurological deficits. Extremities: Symmetric 5 x 5 power. Skin: No rashes, lesions or ulcers Psychiatry: Judgement and insight appear normal. Mood & affect appropriate.     Data Reviewed: I have personally reviewed following labs and imaging studies  CBC:  Recent Labs Lab 06/12/17 0903 06/13/17 0313 06/14/17 0549  WBC 6.3 4.5 4.6  NEUTROABS 5.1  --  2.3  HGB 12.4* 11.8* 13.0  HCT 37.7* 35.1* 38.0*  MCV 85.7 85.4 85.8  PLT 262 270 258   Basic Metabolic Panel:  Recent Labs Lab 06/12/17 0903 06/13/17 0313 06/13/17 0856 06/14/17 0549 06/15/17 0847  NA 140 136  --  136 134*  K 3.4* 2.9*  --  3.8 3.6  CL 104 103  --  104 101  CO2 24 24  --  25 23  GLUCOSE 99 96  --  78 112*  BUN 22* 17  --  20 25*  CREATININE 3.11* 2.90*  --  3.29* 3.68*  CALCIUM 9.2 8.6*  --  9.0 9.0  MG  --   --  2.0 2.1  --   PHOS  --   --   --  3.5  --    GFR: Estimated Creatinine Clearance: 30.4 mL/min (A) (by C-G formula based on SCr of 3.68 mg/dL (H)). Liver Function Tests:  Recent Labs Lab 06/12/17 0903 06/14/17 0549  AST 23  --   ALT 18  --   ALKPHOS 88  --   BILITOT 1.0  --   PROT 7.4  --   ALBUMIN 4.2 3.7   No results for input(s): LIPASE, AMYLASE in the last 168 hours. No results for input(s): AMMONIA in the last 168 hours. Coagulation Profile: No results for input(s): INR, PROTIME in the last 168 hours. Cardiac Enzymes: No results for input(s): CKTOTAL, CKMB, CKMBINDEX, TROPONINI in the last 168 hours. BNP (last 3 results) No results for input(s): PROBNP in the last 8760 hours. HbA1C: No results for input(s): HGBA1C in the last 72 hours. CBG: No results for input(s): GLUCAP in the last 168 hours. Lipid Profile: No results for input(s): CHOL, HDL, LDLCALC, TRIG,  CHOLHDL, LDLDIRECT in the last 72 hours. Thyroid Function Tests: No results for input(s): TSH, T4TOTAL, FREET4, T3FREE, THYROIDAB in the last 72 hours. Anemia Panel: No results for input(s): VITAMINB12, FOLATE, FERRITIN, TIBC, IRON, RETICCTPCT in the last 72 hours. Sepsis Labs: No results for input(s): PROCALCITON, LATICACIDVEN in the last 168 hours.  Recent Results (from the past 240 hour(s))  MRSA PCR Screening     Status: None   Collection Time: 06/12/17 10:04 PM  Result Value Ref Range Status   MRSA by PCR NEGATIVE NEGATIVE Final    Comment:        The GeneXpert MRSA Assay (FDA approved for NASAL specimens only), is one component of a comprehensive MRSA colonization surveillance program. It is not intended to diagnose MRSA infection nor to guide or monitor treatment for MRSA infections.          Radiology Studies: No results found.      Scheduled Meds: . amLODipine  10 mg Oral Daily  . atorvastatin  40 mg Oral q1800  . cloNIDine  0.3 mg Oral TID  . heparin  5,000 Units Subcutaneous Q8H  . hydrALAZINE  100 mg Oral TID  . [START ON 06/16/2017] Influenza vac split quadrivalent PF  0.5 mL Intramuscular Tomorrow-1000  . labetalol  400 mg Oral TID  . nicotine  14 mg Transdermal Daily  . traZODone  50 mg Oral QHS   Continuous Infusions: . sodium chloride    . sodium chloride       LOS: 3 days    Time spent: 35 minutes    Sunaina Ferrando, MD [facility] Pager [phone]  If 7PM-7AM, please contact night-coverage [email or web address] Password [facility] 06/15/2017, 6:49 PM

## 2017-06-15 NOTE — Progress Notes (Signed)
Dr Elwyn Reach at bedside to assess patient for ICU . Informed by Dr Elwyn Reach patient is currently stable so there is no need for patient to go to ICU. As per MD he will be adjusting patient medications and monitor patient for the next 3 hours.

## 2017-06-15 NOTE — Progress Notes (Signed)
Nutrition Brief Note  Patient identified on the Malnutrition Screening Tool (MST) Report  Wt Readings from Last 15 Encounters:  06/14/17 198 lb 6.4 oz (90 kg)  11/30/16 221 lb 6.4 oz (100.4 kg)  11/15/16 222 lb (100.7 kg)  11/02/16 217 lb 6.4 oz (98.6 kg)  11/02/16 216 lb 1.9 oz (98 kg)  10/03/16 204 lb 12.8 oz (92.9 kg)  08/24/15 233 lb (105.7 kg)   40 y.o.male with past medical history of uncontrolled hypertension. Presented to the emergency department 9/10 with complaints of headache and visual field disturbances, he was admitted for evaluation of PRES, stroke.  Pt admitted with hypertensive urgency.   Spoke with pt, who reports significant wt loss (10.4% over the past 6 months) is related to lifestyle changes. Pt has been following a low sodium diet over the past 9 months. He reports he has been consuming fish, chicken/turkey, and lots of vegetables. He has also increased physical activity.   Nutrition-Focused physical exam completed. Findings are no fat depletion, no muscle depletion, and no edema.   Body mass index is 28.47 kg/m. Patient meets criteria for overweight based on current BMI.   Current diet order is 2 grams sodium, patient is consuming approximately 50-80% of meals at this time. Labs and medications reviewed.   No nutrition interventions warranted at this time. If nutrition issues arise, please consult RD.   Tearia Gibbs A. Jimmye Norman, RD, LDN, CDE Pager: [phone] After hours Pager: [phone]

## 2017-06-16 MED ORDER — LABETALOL HCL 200 MG PO TABS: 400.0000 mg | ORAL_TABLET | Freq: Three times a day (TID) | ORAL | 0 refills | Status: DC

## 2017-06-16 MED ORDER — NICOTINE 14 MG/24HR TD PT24: 14.0000 mg | MEDICATED_PATCH | Freq: Every day | TRANSDERMAL | 0 refills | Status: DC

## 2017-06-16 MED ORDER — ATORVASTATIN CALCIUM 40 MG PO TABS: 40.0000 mg | ORAL_TABLET | Freq: Every day | ORAL | 0 refills | Status: DC

## 2017-06-16 MED ORDER — HYDRALAZINE HCL 100 MG PO TABS: 100.0000 mg | ORAL_TABLET | Freq: Three times a day (TID) | ORAL | 3 refills | Status: DC

## 2017-06-16 MED ORDER — CLONIDINE HCL 0.3 MG PO TABS: 0.3000 mg | ORAL_TABLET | Freq: Three times a day (TID) | ORAL | 0 refills | Status: DC

## 2017-06-16 MED ORDER — TRAZODONE HCL 50 MG PO TABS: 50.0000 mg | ORAL_TABLET | Freq: Every day | ORAL | 0 refills | Status: DC

## 2017-06-16 MED ORDER — ASPIRIN EC 81 MG PO TBEC: 81.0000 mg | DELAYED_RELEASE_TABLET | Freq: Every day | ORAL | 0 refills | Status: DC | PRN

## 2017-06-16 MED ORDER — AMLODIPINE BESYLATE 10 MG PO TABS: 10.0000 mg | ORAL_TABLET | Freq: Every day | ORAL | 1 refills | Status: DC

## 2017-06-16 NOTE — Progress Notes (Signed)
Agrees with previous RN assessment. Patient resting comfortably and BP remains stable will continue to monitor.

## 2017-06-16 NOTE — Care Management Note (Addendum)
Case Management Note  Patient Details  Name: Stanley [name] MRN: [id number] Date of Birth: [birth year]  Subjective/Objective:             Admitted with HTN urgency. From home with mom.  PCP: Karle Plumber  Action/Plan: Plan is to d/c to home today. Post f/u appointment scheduled for 06/20/2017 @ 9:45 am @ [facility].  Expected Discharge Date:  06/16/17               Expected Discharge Plan:  Home/Self Care  In-House Referral:     Discharge planning Services  CM Consult, Follow-up appt scheduled, Medication Assistance, [facility]     Status of Service:  Completed, signed off  If discussed at [place] of Stay Meetings, dates discussed:    Additional Comments:  Sharin Mons, RN 06/16/2017, 12:57 PM

## 2017-06-17 ENCOUNTER — Telehealth: Payer: Self-pay | Admitting: Internal Medicine

## 2017-06-17 NOTE — Discharge Summary (Signed)
Physician Discharge Summary  Stanley [name] IDP:[id number] DOB: 11/02/1975 DOA: 06/12/2017  PCP: Ladell Pier, MD  Admit date: 06/12/2017 Discharge date: 06/16/2017  Admitted From: Home.  Disposition:  Home  Recommendations for Outpatient Follow-up:  1. Follow up with PCP in 1-2 weeks 2. Please obtain BMP/CBC in one week Please follow up of nephrology as recommended.   Discharge Condition:stable CODE STATUS:full code Diet recommendation: Heart Healthy   Brief/Interim Summary: 41 y.o.male with past medical history of uncontrolled hypertension. Presented to the emergency department 9/10 with complaints of headache and visual field disturbances, he was admitted for evaluation of PRES, stroke. Marland Kitchen   Discharge Diagnoses:  Active Problems:   Hypertensive urgency  Hypertensive crisis:  Pt was started on amlodipine, hydralazine 100 mg TID, Labetolol 400 mg TID, pt allergic to nitroglycerin.  Clonidine was added overnight by PCCM, his bp improved ,  And his BP improved and remained at 150/90 mmhg.   Stage 4 CKD;  creatinine at baseline.  Outpatient follow up with nephrologist as recommended.     Headache and visual field disturbances: resolved.   Hyperlipidemia:  on lipitor.    Discharge Instructions  Discharge Instructions    Diet - low sodium heart healthy    Complete by:  As directed    Discharge instructions    Complete by:  As directed    Please follow up with PCP in one week Please follow up with nephrologist Dr [name] in one week.     Allergies as of 06/16/2017      Reactions   Lactose Intolerance (gi) Nausea And Vomiting   Other Swelling   All beans   Nitroglycerin Anxiety, Other (See Comments)   Pain and feels like he is on fire      Medication List    STOP taking these medications   benzonatate 100 MG capsule Commonly known as:  TESSALON   furosemide 80 MG tablet Commonly known as:  LASIX   isosorbide mononitrate 60 MG 24 hr  tablet Commonly known as:  IMDUR     TAKE these medications   amLODipine 10 MG tablet Commonly known as:  NORVASC Take 1 tablet (10 mg total) by mouth daily.   aspirin EC 81 MG tablet Take 1 tablet (81 mg total) by mouth daily as needed for mild pain.   atorvastatin 40 MG tablet Commonly known as:  LIPITOR Take 1 tablet (40 mg total) by mouth daily at 6 PM. What changed:  See the new instructions.   cloNIDine 0.3 MG tablet Commonly known as:  CATAPRES Take 1 tablet (0.3 mg total) by mouth 3 (three) times daily. What changed:  medication strength  See the new instructions.   hydrALAZINE 100 MG tablet Commonly known as:  APRESOLINE Take 1 tablet (100 mg total) by mouth 3 (three) times daily.   labetalol 200 MG tablet Commonly known as:  NORMODYNE Take 2 tablets (400 mg total) by mouth 3 (three) times daily. What changed:  when to take this   multivitamin with minerals Tabs tablet Take 1 tablet by mouth daily as needed.   nicotine 14 mg/24hr patch Commonly known as:  NICODERM CQ - dosed in mg/24 hours Place 1 patch (14 mg total) onto the skin daily.   traZODone 50 MG tablet Commonly known as:  DESYREL Take 1 tablet (50 mg total) by mouth at bedtime.            Discharge Care Instructions        Start  Ordered   06/16/17 0000  amLODipine (NORVASC) 10 MG tablet  Daily     06/16/17 1153   06/16/17 0000  aspirin EC 81 MG tablet  Daily PRN     06/16/17 1153   06/16/17 0000  atorvastatin (LIPITOR) 40 MG tablet  Daily-1800     06/16/17 1153   06/16/17 0000  cloNIDine (CATAPRES) 0.3 MG tablet  3 times daily     06/16/17 1153   06/16/17 0000  hydrALAZINE (APRESOLINE) 100 MG tablet  3 times daily     06/16/17 1153   06/16/17 0000  labetalol (NORMODYNE) 200 MG tablet  3 times daily     06/16/17 1153   06/16/17 0000  nicotine (NICODERM CQ - DOSED IN MG/24 HOURS) 14 mg/24hr patch  Daily     06/16/17 1153   06/16/17 0000  traZODone (DESYREL) 50 MG tablet  Daily  at bedtime     06/16/17 1153   06/16/17 0000  Diet - low sodium heart healthy     06/16/17 1153   06/16/17 0000  Discharge instructions    Comments:  Please follow up with PCP in one week Please follow up with nephrologist Dr [name] in one week.   06/16/17 [place] Follow up on 06/20/2017.   Why:  9:45 am for hospital follow up Contact information: [address] [id number] [place]       Donato Heinz, MD. Schedule an appointment as soon as possible for a visit in 1 week(s).   Specialty:  Nephrology Contact information: [address] [place] [address] [phone]          Allergies  Allergen Reactions  . Lactose Intolerance (Gi) Nausea And Vomiting  . Other Swelling    All beans  . Nitroglycerin Anxiety and Other (See Comments)    Pain and feels like he is on fire    Consultations:  PCCM    Procedures/Studies: Ct Head Wo Contrast  Result Date: 06/12/2017 CLINICAL DATA:  Left occipital headache for 1 day with nausea and vomiting. Hypertension. Dizziness. EXAM: CT HEAD WITHOUT CONTRAST TECHNIQUE: Contiguous axial images were obtained from the base of the skull through the vertex without intravenous contrast. COMPARISON:  None. FINDINGS: Brain: The ventricles are normal in size and configuration. There is no intracranial mass, hemorrhage, extra-axial fluid collection, or midline shift. Gray-white compartments are normal. No evident acute infarct. Vascular: There is no hyperdense vessel. There is no appreciable vascular calcification. Skull: Bony calvarium appears intact. Sinuses/Orbits: There is mucosal thickening in both maxillary antra, considerably more on the left than on the right. There is mucosal thickening and opacification in multiple ethmoid air cells bilaterally. There is opacification in both anterior sphenoid sinus regions, more severe on the  right than on the left. There is mild mucosal thickening in the inferior, medial aspects of each frontal sinus. There is rightward deviation of the nasal septum. Orbits appear symmetric bilaterally. Other: Visualized mastoid air cells are clear. IMPRESSION: Multifocal paranasal sinus disease. Deviated nasal septum toward the right. No intracranial mass, hemorrhage, or extra-axial fluid collection. Gray-white compartments appear normal. Electronically Signed   By: Lowella Grip III M.D.   On: 06/12/2017 08:37   Mr Virgel Paling VO Contrast  Result Date: 06/12/2017 CLINICAL DATA:  Initial evaluation for acute right-sided visual loss, paresthesias, severe headache with severe hypertension. EXAM: MRI HEAD WITHOUT CONTRAST MRA HEAD WITHOUT CONTRAST  TECHNIQUE: Multiplanar, multiecho pulse sequences of the brain and surrounding structures were obtained without intravenous contrast. Angiographic images of the head were obtained using MRA technique without contrast. COMPARISON:  Comparison made with prior CT from earlier the same day. FINDINGS: MRI HEAD FINDINGS Brain:  Study moderately degraded by motion artifact. Cerebral volume within normal limits for age. Suspected few scattered foci of T2/FLAIR hyperintensity involving the supratentorial cerebral white matter, nonspecific, but most like related to chronic microvascular ischemic changes. No definite signal changes to suggest underlying PRES or other acute abnormality. No evidence for acute or subacute ischemia. Gray-white matter differentiation maintained. Probable small remote lacunar infarct noted within the right paracentral pons. No other definite evidence for remote ischemia. No acute or chronic intracranial hemorrhage. No evidence for hypertensive encephalopathy. No mass lesion, midline shift or mass effect. No hydrocephalus. No extra-axial fluid collection. Major dural sinuses are grossly patent. Vascular: Major intracranial vascular flow voids grossly  maintained. Skull and upper cervical spine: Craniocervical junction within normal limits. Visualized upper cervical spine unremarkable. Bone marrow signal intensity within normal limits. No scalp soft tissue abnormality. Sinuses/Orbits: Globes and orbital soft tissues grossly within normal limits. Mild-to-moderate mucosal thickening noted within the sphenoid, ethmoidal, and maxillary sinuses. No air-fluid level to suggest acute sinusitis. No mastoid effusion. Inner ear structures grossly normal. Other: None. MRA HEAD FINDINGS ANTERIOR CIRCULATION: Study moderately degraded by motion artifact. Distal cervical segments of the internal carotid arteries are patent with antegrade flow. Petrous, cavernous, and supraclinoid segments patent without flow-limiting stenosis. ICA termini patent. A1 segments grossly patent. Anterior communicating artery grossly normal. Anterior cerebral arteries patent to their distal aspects. No obvious stenosis, although evaluation limited by motion. M1 segments patent without obvious stenosis. Motion limits evaluation of the distal MCA branches which are grossly symmetric and well perfused. POSTERIOR CIRCULATION: Vertebral artery's patent to the vertebrobasilar junction without stenosis. Left vertebral artery dominant. Posterior inferior cerebral arteries patent proximally. Basilar artery patent to its distal aspect without obvious stenosis. Superior cerebral arteries patent proximally. Both of the posterior cerebral artery supplied via the basilar and are widely patent to their distal aspects. No aneurysm. No obvious evidence for vasospasm, although evaluation limited on this motion degraded exam. IMPRESSION: MRI HEAD IMPRESSION: 1. Motion degraded study. 2. No definite acute intracranial process identified. 3. Remote subcentimeter lacunar right pontine infarct. MRA HEAD IMPRESSION: 1. Motion degraded study. 2. Grossly negative intracranial MRA. No large vessel occlusion. No obvious  flow-limiting stenosis. No obvious evidence for vasospasm, although evaluation fairly limited on this motion degraded exam. Electronically Signed   By: Jeannine Boga M.D.   On: 06/12/2017 15:56   Mr Brain Wo Contrast  Result Date: 06/12/2017 CLINICAL DATA:  Initial evaluation for acute right-sided visual loss, paresthesias, severe headache with severe hypertension. EXAM: MRI HEAD WITHOUT CONTRAST MRA HEAD WITHOUT CONTRAST TECHNIQUE: Multiplanar, multiecho pulse sequences of the brain and surrounding structures were obtained without intravenous contrast. Angiographic images of the head were obtained using MRA technique without contrast. COMPARISON:  Comparison made with prior CT from earlier the same day. FINDINGS: MRI HEAD FINDINGS Brain:  Study moderately degraded by motion artifact. Cerebral volume within normal limits for age. Suspected few scattered foci of T2/FLAIR hyperintensity involving the supratentorial cerebral white matter, nonspecific, but most like related to chronic microvascular ischemic changes. No definite signal changes to suggest underlying PRES or other acute abnormality. No evidence for acute or subacute ischemia. Gray-white matter differentiation maintained. Probable small remote lacunar infarct noted within the right paracentral  pons. No other definite evidence for remote ischemia. No acute or chronic intracranial hemorrhage. No evidence for hypertensive encephalopathy. No mass lesion, midline shift or mass effect. No hydrocephalus. No extra-axial fluid collection. Major dural sinuses are grossly patent. Vascular: Major intracranial vascular flow voids grossly maintained. Skull and upper cervical spine: Craniocervical junction within normal limits. Visualized upper cervical spine unremarkable. Bone marrow signal intensity within normal limits. No scalp soft tissue abnormality. Sinuses/Orbits: Globes and orbital soft tissues grossly within normal limits. Mild-to-moderate mucosal  thickening noted within the sphenoid, ethmoidal, and maxillary sinuses. No air-fluid level to suggest acute sinusitis. No mastoid effusion. Inner ear structures grossly normal. Other: None. MRA HEAD FINDINGS ANTERIOR CIRCULATION: Study moderately degraded by motion artifact. Distal cervical segments of the internal carotid arteries are patent with antegrade flow. Petrous, cavernous, and supraclinoid segments patent without flow-limiting stenosis. ICA termini patent. A1 segments grossly patent. Anterior communicating artery grossly normal. Anterior cerebral arteries patent to their distal aspects. No obvious stenosis, although evaluation limited by motion. M1 segments patent without obvious stenosis. Motion limits evaluation of the distal MCA branches which are grossly symmetric and well perfused. POSTERIOR CIRCULATION: Vertebral artery's patent to the vertebrobasilar junction without stenosis. Left vertebral artery dominant. Posterior inferior cerebral arteries patent proximally. Basilar artery patent to its distal aspect without obvious stenosis. Superior cerebral arteries patent proximally. Both of the posterior cerebral artery supplied via the basilar and are widely patent to their distal aspects. No aneurysm. No obvious evidence for vasospasm, although evaluation limited on this motion degraded exam. IMPRESSION: MRI HEAD IMPRESSION: 1. Motion degraded study. 2. No definite acute intracranial process identified. 3. Remote subcentimeter lacunar right pontine infarct. MRA HEAD IMPRESSION: 1. Motion degraded study. 2. Grossly negative intracranial MRA. No large vessel occlusion. No obvious flow-limiting stenosis. No obvious evidence for vasospasm, although evaluation fairly limited on this motion degraded exam. Electronically Signed   By: Jeannine Boga M.D.   On: 06/12/2017 15:56    Subjective:  No new complaints. No headache or nausea, vomiting.  Discharge Exam: Vitals:   06/16/17 0541 06/16/17 1513   BP: (!) 153/91 (!) 142/83  Pulse: (!) 54 62  Resp: 16 20  Temp: 98.1 F (36.7 C) 98.2 F (36.8 C)  SpO2: 97% 97%   Vitals:   06/16/17 0100 06/16/17 0500 06/16/17 0541 06/16/17 1513  BP: (!) 155/97  (!) 153/91 (!) 142/83  Pulse:   (!) 54 62  Resp:   16 20  Temp:   98.1 F (36.7 C) 98.2 F (36.8 C)  TempSrc:    Oral  SpO2:   97% 97%  Weight:  87.7 kg (193 lb 6.4 oz)    Height:        General: Pt is alert, awake, not in acute distress Cardiovascular: RRR, S1/S2 +, no rubs, no gallops Respiratory: CTA bilaterally, no wheezing, no rhonchi Abdominal: Soft, NT, ND, bowel sounds + Extremities: no edema, no cyanosis    The results of significant diagnostics from this hospitalization (including imaging, microbiology, ancillary and laboratory) are listed below for reference.     Microbiology: Recent Results (from the past 240 hour(s))  MRSA PCR Screening     Status: None   Collection Time: 06/12/17 10:04 PM  Result Value Ref Range Status   MRSA by PCR NEGATIVE NEGATIVE Final    Comment:        The GeneXpert MRSA Assay (FDA approved for NASAL specimens only), is one component of a comprehensive MRSA colonization surveillance program. It is  not intended to diagnose MRSA infection nor to guide or monitor treatment for MRSA infections.      Labs: BNP (last 3 results)  Recent Labs  09/26/16 2030  BNP 1,884.1*   Basic Metabolic Panel:  Recent Labs Lab 06/12/17 0903 06/13/17 0313 06/13/17 0856 06/14/17 0549 06/15/17 0847  NA 140 136  --  136 134*  K 3.4* 2.9*  --  3.8 3.6  CL 104 103  --  104 101  CO2 24 24  --  25 23  GLUCOSE 99 96  --  78 112*  BUN 22* 17  --  20 25*  CREATININE 3.11* 2.90*  --  3.29* 3.68*  CALCIUM 9.2 8.6*  --  9.0 9.0  MG  --   --  2.0 2.1  --   PHOS  --   --   --  3.5  --    Liver Function Tests:  Recent Labs Lab 06/12/17 0903 06/14/17 0549  AST 23  --   ALT 18  --   ALKPHOS 88  --   BILITOT 1.0  --   PROT 7.4  --    ALBUMIN 4.2 3.7   No results for input(s): LIPASE, AMYLASE in the last 168 hours. No results for input(s): AMMONIA in the last 168 hours. CBC:  Recent Labs Lab 06/12/17 0903 06/13/17 0313 06/14/17 0549  WBC 6.3 4.5 4.6  NEUTROABS 5.1  --  2.3  HGB 12.4* 11.8* 13.0  HCT 37.7* 35.1* 38.0*  MCV 85.7 85.4 85.8  PLT 262 270 282   Cardiac Enzymes: No results for input(s): CKTOTAL, CKMB, CKMBINDEX, TROPONINI in the last 168 hours. BNP: Invalid input(s): POCBNP CBG: No results for input(s): GLUCAP in the last 168 hours. D-Dimer No results for input(s): DDIMER in the last 72 hours. Hgb A1c No results for input(s): HGBA1C in the last 72 hours. Lipid Profile No results for input(s): CHOL, HDL, LDLCALC, TRIG, CHOLHDL, LDLDIRECT in the last 72 hours. Thyroid function studies No results for input(s): TSH, T4TOTAL, T3FREE, THYROIDAB in the last 72 hours.  Invalid input(s): FREET3 Anemia work up No results for input(s): VITAMINB12, FOLATE, FERRITIN, TIBC, IRON, RETICCTPCT in the last 72 hours. Urinalysis    Component Value Date/Time   COLORURINE STRAW (A) 09/27/2016 0017   APPEARANCEUR CLEAR 09/27/2016 0017   LABSPEC 1.010 09/27/2016 0017   PHURINE 7.0 09/27/2016 0017   GLUCOSEU 50 (A) 09/27/2016 0017   HGBUR NEGATIVE 09/27/2016 0017   BILIRUBINUR NEGATIVE 09/27/2016 0017   KETONESUR NEGATIVE 09/27/2016 0017   PROTEINUR >=300 (A) 09/27/2016 0017   UROBILINOGEN 0.2 09/29/2009 1630   NITRITE NEGATIVE 09/27/2016 0017   LEUKOCYTESUR NEGATIVE 09/27/2016 0017   Sepsis Labs Invalid input(s): PROCALCITONIN,  WBC,  LACTICIDVEN Microbiology Recent Results (from the past 240 hour(s))  MRSA PCR Screening     Status: None   Collection Time: 06/12/17 10:04 PM  Result Value Ref Range Status   MRSA by PCR NEGATIVE NEGATIVE Final    Comment:        The GeneXpert MRSA Assay (FDA approved for NASAL specimens only), is one component of a comprehensive MRSA colonization surveillance  program. It is not intended to diagnose MRSA infection nor to guide or monitor treatment for MRSA infections.      Time coordinating discharge: Over 30 minutes  SIGNED:   Hosie Poisson, MD  [facility] 06/17/2017, 10:33 AM Pager   If 7PM-7AM, please contact night-coverage [email or web address] Password [email or web address]

## 2017-06-17 NOTE — Telephone Encounter (Signed)
Opened in error

## 2017-06-19 MED FILL — traZODone HCL 50 MG TABS: 50 | 10 days supply | Qty: 10 | Fill #0

## 2017-06-20 ENCOUNTER — Ambulatory Visit (INDEPENDENT_AMBULATORY_CARE_PROVIDER_SITE_OTHER): Payer: Self-pay | Admitting: Family Medicine

## 2017-06-20 ENCOUNTER — Encounter: Payer: Self-pay | Admitting: Family Medicine

## 2017-06-20 VITALS — BP 154/90 | HR 64 | Temp 97.8°F | Resp 14 | Ht 70.0 in | Wt 203.0 lb

## 2017-06-20 DIAGNOSIS — I509 Heart failure, unspecified: Secondary | ICD-10-CM

## 2017-06-20 DIAGNOSIS — I10 Essential (primary) hypertension: Secondary | ICD-10-CM

## 2017-06-20 DIAGNOSIS — N184 Chronic kidney disease, stage 4 (severe): Secondary | ICD-10-CM

## 2017-06-20 NOTE — Patient Instructions (Addendum)
I will forward most recent lab results regarding your renal function to [facility] and request a follow-up visit.   I strongly encourage smoking cessation. I am adding an albuterol inhaler to your regimen for management of shortness of breath and persistent cough.  Keep a log of blood pressures and weight. If blood pressure is persistently greater than 160/90, notify me here at the office.  -Limit fluid intake to no more than 1800 ml per day. -If you experiencing greater than 3 lb weight gain within 24 hours, notify our clinic or if after hours report to the Emergency Department. -It is important to keep your blood pressure under control. Monitor blood pressure at home and if you obtain readings greater than 150/90 consecutively over a period of 3 days, notify me here at the office. -Avoid adding table salt or eating food such as can soup or frozen meals which are high in sodium. This increases fluid retention and is likely to worsen your heart failure.  -Read the educational information below thoroughly to aid you in management of your heart failure symptoms.     Heart Failure Heart failure means your heart has trouble pumping blood. This makes it hard for your body to work well. Heart failure is usually a long-term (chronic) condition. You must take good care of yourself and follow your doctor's treatment plan. Follow these instructions at home:  Take your heart medicine as told by your doctor. ? Do not stop taking medicine unless your doctor tells you to. ? Do not skip any dose of medicine. ? Refill your medicines before they run out. ? Take other medicines only as told by your doctor or pharmacist.  Stay active if told by your doctor. The elderly and people with severe heart failure should talk with a doctor about physical activity.  Eat heart-healthy foods. Choose foods that are without trans fat and are low in saturated fat, cholesterol, and salt (sodium). This includes fresh or  frozen fruits and vegetables, fish, lean meats, fat-free or low-fat dairy foods, whole grains, and high-fiber foods. Lentils and dried peas and beans (legumes) are also good choices.  Limit salt if told by your doctor.  Cook in a healthy way. Roast, grill, broil, bake, poach, steam, or stir-fry foods.  Limit fluids as told by your doctor.  Weigh yourself every morning. Do this after you pee (urinate) and before you eat breakfast. Write down your weight to give to your doctor.  Take your blood pressure and write it down if your doctor tells you to.  Ask your doctor how to check your pulse. Check your pulse as told.  Lose weight if told by your doctor.  Stop smoking or chewing tobacco. Do not use gum or patches that help you quit without your doctor's approval.  Schedule and go to doctor visits as told.  Nonpregnant women should have no more than 1 drink a day. Men should have no more than 2 drinks a day. Talk to your doctor about drinking alcohol.  Stop illegal drug use.  Stay current with shots (immunizations).  Manage your health conditions as told by your doctor.  Learn to manage your stress.  Rest when you are tired.  If it is really hot outside: ? Avoid intense activities. ? Use air conditioning or fans, or get in a cooler place. ? Avoid caffeine and alcohol. ? Wear loose-fitting, lightweight, and light-colored clothing.  If it is really cold outside: ? Avoid intense activities. ? Layer your clothing. ?  Wear mittens or gloves, a hat, and a scarf when going outside. ? Avoid alcohol.  Learn about heart failure and get support as needed.  Get help to maintain or improve your quality of life and your ability to care for yourself as needed. Contact a doctor if:  You gain weight quickly.  You are more short of breath than usual.  You cannot do your normal activities.  You tire easily.  You cough more than normal, especially with activity.  You have any or more  puffiness (swelling) in areas such as your hands, feet, ankles, or belly (abdomen).  You cannot sleep because it is hard to breathe.  You feel like your heart is beating fast (palpitations).  You get dizzy or light-headed when you stand up. Get help right away if:  You have trouble breathing.  There is a change in mental status, such as becoming less alert or not being able to focus.  You have chest pain or discomfort.  You faint. This information is not intended to replace advice given to you by your health care provider. Make sure you discuss any questions you have with your health care provider. Document Released: 06/28/2008 Document Revised: 02/25/2016 Document Reviewed: 11/05/2012 Elsevier Interactive Patient Education  2017 [employer].

## 2017-06-20 NOTE — Progress Notes (Signed)
Patient ID: Stanley [name], male    DOB: 07-20-1976, 41 y.o.   MRN: [id number]  PCP: Scot Jun, FNP  Chief Complaint  Patient presents with  . Establish Care  . Hospitalization Follow-up    Subjective:  HPI Stanley [name] is a 41 y.o. male with low health literacy, presents to establish care and hospital follow-up. Medical problems include current everyday tobacco use, malignant hypertension, CKD stage 4, hyperparathyroidism, and LVH. Stanley [name] was hospitalized 06/12/2017 for stroke like symptoms involving visual disturbances, headache, visual disturbances. He was found to in hypertensive crisis which was thought to be related to medication noncompliance. CVA was ruled out. He remained hospitalized for 4 days and was discharged once BP was stable. He is currently residing with this brother who is assisting with medication administration. Stanley [name] reports compliance with all medications since hospital discharge. He is followed by Dr. Marlou Porch at [facility]. Last Echo 09/28/2016 demonstrated severe concentric hypertrophy with normal ejection fraction.  Last cardiology visit 11/15/2016 although he has an upcoming appointment scheduled 07/10/2017.  Stanley [name] suffers from CKD 4 is followed by Dr. Moshe Cipro, MD at [facility]. Reports last visit was end of August.  Most recent creatinine 3.68, BUN 19, and GFR 22. He continues to smoke cigarettes although he is using nicotine patch.  Complains of ongoing shortness of breath at rest and with activity. Denies cough, edema, or chest pain.  Social History   Social History  . Marital status: Married    Spouse name: N/A  . Number of children: N/A  . Years of education: N/A   Occupational History  . Not on file.   Social History Main Topics  . Smoking status: Current Every Day Smoker    Packs/day: 1.00    Years: 30.00    Types: Cigarettes  . Smokeless tobacco: Never Used  . Alcohol use No  . Drug use: Yes    Types: Marijuana     Comment: 06/13/2017 "not  often"  . Sexual activity: Not Currently   Other Topics Concern  . Not on file   Social History Narrative  . No narrative on file   Family History  Problem Relation Age of Onset  . Hypertension Mother   . Hypertension Brother   . Diabetes type II Brother    Review of Systems See HPI  Patient Active Problem List   Diagnosis Date Noted  . Hypertensive urgency 06/12/2017  . CKD stage 4 secondary to hypertension ([place]) 03/01/2017  . Anemia, chronic disease 03/01/2017  . Hyperparathyroidism, secondary renal ([place]) 03/01/2017  . Acute dyspnea   . Tobacco abuse 09/26/2016  . Elevated troponin 09/26/2016  . Hypertension 09/26/2016  . Hypokalemia 09/26/2016  . AKI (acute kidney injury) ([place]) 09/26/2016    Allergies  Allergen Reactions  . Lactose Intolerance (Gi) Nausea And Vomiting  . Other Swelling    All beans  . Nitroglycerin Anxiety and Other (See Comments)    Pain and feels like he is on fire    Prior to Admission medications   Medication Sig Start Date End Date Taking? Authorizing Provider  amLODipine (NORVASC) 10 MG tablet Take 1 tablet (10 mg total) by mouth daily. 06/16/17  Yes Hosie Poisson, MD  aspirin EC 81 MG tablet Take 1 tablet (81 mg total) by mouth daily as needed for mild pain. 06/16/17  Yes Hosie Poisson, MD  atorvastatin (LIPITOR) 40 MG tablet Take 1 tablet (40 mg total) by mouth daily at 6 PM. 06/16/17  Yes Hosie Poisson,  MD  cloNIDine (CATAPRES) 0.3 MG tablet Take 1 tablet (0.3 mg total) by mouth 3 (three) times daily. 06/16/17  Yes Hosie Poisson, MD  hydrALAZINE (APRESOLINE) 100 MG tablet Take 1 tablet (100 mg total) by mouth 3 (three) times daily. 06/16/17  Yes Hosie Poisson, MD  labetalol (NORMODYNE) 200 MG tablet Take 2 tablets (400 mg total) by mouth 3 (three) times daily. 06/16/17  Yes Hosie Poisson, MD  Multiple Vitamin (MULTIVITAMIN WITH MINERALS) TABS tablet Take 1 tablet by mouth daily as needed.   Yes [provider]  nicotine (NICODERM CQ -  DOSED IN MG/24 HOURS) 14 mg/24hr patch Place 1 patch (14 mg total) onto the skin daily. 06/16/17  Yes Hosie Poisson, MD  traZODone (DESYREL) 50 MG tablet Take 1 tablet (50 mg total) by mouth at bedtime. 06/16/17  Yes Hosie Poisson, MD    Past Medical, Surgical Family and Social History reviewed and updated.    Objective:   Today's Vitals   06/20/17 1014 06/20/17 1059  BP: (!) 160/90 (!) 154/90  Pulse: 64   Resp: 14   Temp: 97.8 F (36.6 C)   TempSrc: Oral   SpO2: 97%   Weight: 203 lb (92.1 kg)   Height: 5\' 10"  (1.778 m)      Wt Readings from Last 3 Encounters:  06/20/17 203 lb (92.1 kg)  06/16/17 193 lb 6.4 oz (87.7 kg)  11/30/16 221 lb 6.4 oz (100.4 kg)    Physical Exam  Constitutional: He is oriented to person, place, and time. He appears well-developed and well-nourished.  HENT:  Head: Normocephalic and atraumatic.  Nose: Nose normal.  Mouth/Throat: Oropharynx is clear and moist.  Eyes: Pupils are equal, round, and reactive to light. Conjunctivae and EOM are normal.  Neck: Normal range of motion. Neck supple. No thyromegaly present.  Cardiovascular: Normal rate, regular rhythm, normal heart sounds and intact distal pulses.   Pulmonary/Chest: Effort normal and breath sounds normal.  Abdominal: Soft. Bowel sounds are normal. He exhibits no distension.  Musculoskeletal: Normal range of motion.  Lymphadenopathy:    He has no cervical adenopathy.  Neurological: He is alert and oriented to person, place, and time.  Skin: Skin is warm and dry.  Psychiatric: He has a normal mood and affect. His behavior is normal. Judgment and thought content normal.   Assessment & Plan:  1. CKD (chronic kidney disease) stage 4, GFR 15-29 ml/min ([place]), continue follow-up with [facility] and will forward labs from today to there office.  2. Acute on chronic/heart failure, uns/pecified heart failure type (HCC)-managed by [facility]. Follow-up as scheduled with [facility] 07/10/2017 3.  Hypertension, accelerated, BP remains not at goal today. BP goal less than 150/90. Continue current regimen consistently. Notify office if BP is consistently greater than 160/90.   Orders Placed This Encounter  Procedures  . COMPLETE METABOLIC PANEL WITH GFR  . CBC with Differential  . Brain natriuretic peptide  . TIQ-NTM    Meds ordered this encounter  Medications  . albuterol (PROVENTIL HFA;VENTOLIN HFA) 108 (90 Base) MCG/ACT inhaler    Sig: Inhale 2 puffs into the lungs every 4 (four) hours as needed for wheezing or shortness of breath (cough, shortness of breath or wheezing.).    Dispense:  1 Inhaler    Refill:  1    Order Specific Question:   Supervising Provider    Answer:   Tresa Garter [id number]   RTC: 2 months for chronic conditions   Carroll Sage. Kenton Kingfisher, MSN, FNP-C  [facility]  [address] [place], [place] [address] [phone]

## 2017-06-21 LAB — CBC WITH DIFFERENTIAL/PLATELET
BASOS ABS: 60 {cells}/uL (ref 0–200)
Basophils Relative: 1.4 %
EOS ABS: 249 {cells}/uL (ref 15–500)
Eosinophils Relative: 5.8 %
HCT: 36.5 % — ABNORMAL LOW (ref 38.5–50.0)
Hemoglobin: 12 g/dL — ABNORMAL LOW (ref 13.2–17.1)
Lymphs Abs: 1294 cells/uL (ref 850–3900)
MCH: 28.8 pg (ref 27.0–33.0)
MCHC: 32.9 g/dL (ref 32.0–36.0)
MCV: 87.7 fL (ref 80.0–100.0)
MONOS PCT: 10.3 %
MPV: 10.4 fL (ref 7.5–12.5)
NEUTROS PCT: 52.4 %
Neutro Abs: 2253 cells/uL (ref 1500–7800)
PLATELETS: 316 10*3/uL (ref 140–400)
RBC: 4.16 10*6/uL — ABNORMAL LOW (ref 4.20–5.80)
RDW: 14.2 % (ref 11.0–15.0)
TOTAL LYMPHOCYTE: 30.1 %
WBC mixed population: 443 cells/uL (ref 200–950)
WBC: 4.3 10*3/uL (ref 3.8–10.8)

## 2017-06-21 LAB — COMPLETE METABOLIC PANEL WITH GFR
AG Ratio: 1.7 (calc) (ref 1.0–2.5)
ALBUMIN MSPROF: 4.2 g/dL (ref 3.6–5.1)
ALT: 22 U/L (ref 9–46)
AST: 12 U/L (ref 10–40)
Alkaline phosphatase (APISO): 87 U/L (ref 40–115)
BUN / CREAT RATIO: 9 (calc) (ref 6–22)
BUN: 32 mg/dL — ABNORMAL HIGH (ref 7–25)
CALCIUM: 9.2 mg/dL (ref 8.6–10.3)
CHLORIDE: 105 mmol/L (ref 98–110)
CO2: 21 mmol/L (ref 20–32)
CREATININE: 3.44 mg/dL — AB (ref 0.60–1.35)
GFR, EST NON AFRICAN AMERICAN: 21 mL/min/{1.73_m2} — AB (ref >=60)
GFR, Est African American: 25 mL/min/{1.73_m2} — ABNORMAL LOW (ref >=60)
GLOBULIN: 2.5 g/dL (ref 1.9–3.7)
Glucose, Bld: 89 mg/dL (ref 65–99)
Potassium: 4.7 mmol/L (ref 3.5–5.3)
Sodium: 139 mmol/L (ref 135–146)
TOTAL PROTEIN: 6.7 g/dL (ref 6.1–8.1)
Total Bilirubin: 0.5 mg/dL (ref 0.2–1.2)

## 2017-06-21 LAB — BRAIN NATRIURETIC PEPTIDE: BRAIN NATRIURETIC PEPTIDE: 214 pg/mL — AB (ref <100)

## 2017-06-21 LAB — TIQ-NTM

## 2017-06-22 ENCOUNTER — Telehealth: Payer: Self-pay | Admitting: Family Medicine

## 2017-06-22 NOTE — Telephone Encounter (Signed)
Please contact [facility] to request copies of patient most recent visit. Please notify practice that I am the patient's new PCP.  Carroll Sage. Kenton Kingfisher, MSN, FNP-C [facility]  [address] [place], [place] [address] [phone]

## 2017-06-25 MED ORDER — ALBUTEROL SULFATE HFA 108 (90 BASE) MCG/ACT IN AERS: 2.0000 | INHALATION_SPRAY | RESPIRATORY_TRACT | 1 refills | Status: DC | PRN

## 2017-06-26 MED FILL — !VENTOLIN HFA INHALER: 108 (90 BAS | 16 days supply | Qty: 18 | Fill #0

## 2017-06-26 NOTE — Telephone Encounter (Signed)
Spoke with Museum/gallery conservator at [employer] and she will fax over last office visit for patient

## 2017-06-29 MED FILL — LABETALOL HCL 200 MG TABLET: 200 | 30 days supply | Qty: 180 | Fill #0

## 2017-07-10 ENCOUNTER — Ambulatory Visit: Payer: Self-pay | Admitting: Nurse Practitioner

## 2017-07-10 NOTE — Progress Notes (Deleted)
CARDIOLOGY OFFICE NOTE  Date:  07/10/2017    Stanley [name] Date of Birth: 03/09/1976 Medical Record #[id number]  PCP:  Stanley Jun, FNP  Cardiologist:  Marlou Porch   No chief complaint on file.   History of Present Illness: Stanley [name] is a 41 y.o. male who presents today for an 8 month check.  Seen for Dr. Marlou Porch.   He has a history of tobacco abuse, HTN, severe LVH, CKD stage 4, and medical non compliance.     He was admitted in 09/2016 with shortness of breath related to hypertensive emergency with acute kidney injury. His blood pressure in the emergency department was 265/168. Nephrology recommended outpatient follow-up and blood pressure management. He is followed by Dr. Burman Foster. He had mildly elevated troponin c/w demand ischemia. Renal ultrasound within normal limits. Echo demonstrated severe concentric hypertrophy with normal ejection fraction and G2DD. Of note, there was concern for cardiac amyloidosis but Dr. Marlou Porch thought that this was very unlikely and more c/w severe uncontrolled HTN.  Antihypertensives tried in the past include: benazepril, metoprolol tartrate, lisinopril (can't take ACEi/ARB/aldosterone antagonist due to kidney function)  He was seen by Dr. Marlou Porch in the office on 11/02/16 for follow up. They had a long discussion about the importance of BP control and compliance given his CKD and severe LVH felt directly related to uncontrolled HTN. Plan was for low salt diet and compliance with cardia meds. 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 Last seen here back in February - had tried to go back to work - did not tolerate. He does heavy manual labor. BP still quite high - work up for secondary causes was advised - does not look like this was followed thru on his part.   Admitted last month - hypertensive urgency and hypertensive encephalopathy - required nicardipine infusion. Noted allergy to  NTG. Clonidine added back (he was on this previously) - discharged with BP of 150/90. He was to see Renal. He was still smoking.   Comes in today. Here with   Past Medical History:  Diagnosis Date  . Anxiety   . CKD (chronic kidney disease), stage IV ([place])   . Depression   . Dyslexia   . High cholesterol   . Hypertension   . Stroke ([place]) 06/03/2017   "minor; completely recovered today" (06/13/2017)  . Tobacco abuse     Past Surgical History:  Procedure Laterality Date  . NO PAST SURGERIES       Medications: No outpatient prescriptions have been marked as taking for the 07/10/17 encounter (Appointment) with Burtis Junes, NP.     Allergies: Allergies  Allergen Reactions  . Lactose Intolerance (Gi) Nausea And Vomiting  . Other Swelling    All beans  . Nitroglycerin Anxiety and Other (See Comments)    Pain and feels like he is on fire    Social History: The patient  reports that he has been smoking Cigarettes.  He has a 30.00 pack-year smoking history. He has never used smokeless tobacco. He reports that he uses drugs, including Marijuana. He reports that he does not drink alcohol.   Family History: The patient's family history includes Diabetes type II in his brother; Hypertension in his brother and mother.   Review of Systems: Please see the history of present illness.   Otherwise, the review of systems is positive for none.   All other systems are  reviewed and negative.   Physical Exam: VS:  There were no vitals taken for this visit. Marland Kitchen  BMI There is no height or weight on file to calculate BMI.  Wt Readings from Last 3 Encounters:  06/20/17 203 lb (92.1 kg)  06/16/17 193 lb 6.4 oz (87.7 kg)  11/30/16 221 lb 6.4 oz (100.4 kg)    General: Pleasant. Well developed, well nourished and in no acute distress.   HEENT: Normal.  Neck: Supple, no JVD, carotid bruits, or masses noted.  Cardiac: Regular rate and rhythm. No murmurs, rubs, or gallops. No edema.    Respiratory:  Lungs are clear to auscultation bilaterally with normal work of breathing.  GI: Soft and nontender.  MS: No deformity or atrophy. Gait and ROM intact.  Skin: Warm and dry. Color is normal.  Neuro:  Strength and sensation are intact and no gross focal deficits noted.  Psych: Alert, appropriate and with normal affect.   LABORATORY DATA:  EKG:  EKG is not ordered today.  Lab Results  Component Value Date   WBC 4.3 06/20/2017   HGB 12.0 (L) 06/20/2017   HCT 36.5 (L) 06/20/2017   PLT 316 06/20/2017   GLUCOSE 89 06/20/2017   CHOL 132 09/27/2016   TRIG 68 09/27/2016   HDL 31 (L) 09/27/2016   LDLCALC 87 09/27/2016   ALT 22 06/20/2017   AST 12 06/20/2017   NA 139 06/20/2017   K 4.7 06/20/2017   CL 105 06/20/2017   CREATININE 3.44 (H) 06/20/2017   BUN 32 (H) 06/20/2017   CO2 21 06/20/2017   TSH 1.220 11/30/2016   HGBA1C 4.8 09/27/2016     BNP (last 3 results)  Recent Labs  09/26/16 2030 06/20/17 1100  BNP 2,911.0* 214*    ProBNP (last 3 results) No results for input(s): PROBNP in the last 8760 hours.   Other Studies Reviewed Today: 2D ECHO: 10-15-2016 LV EF: 55% - 60% Study Conclusions - Left ventricle: The cavity size was mildly dilated. There was severe concentric hypertrophy. Systolic function was normal. The estimated ejection fraction was in the range of 55% to 60%. Wall motion was normal; there were no regional wall motion abnormalities. Features are consistent with a pseudonormal left ventricular filling pattern, with concomitant abnormal relaxation and increased filling pressure (grade 2 diastolic dysfunction). Doppler parameters are consistent with elevated ventricular end-diastolic filling pressure. - Aortic valve: There was mild regurgitation. - Aortic root: The aortic root was normal in size. - Ascending aorta: The ascending aorta was normal in size. - Left atrium: The atrium was moderately to severely dilated. -  Right ventricle: Systolic function was normal. - Tricuspid valve: There was mild regurgitation. - Pulmonary arteries: Systolic pressure was within the normal range. - Inferior vena cava: The vessel was normal in size. - Pericardium, extracardiac: There was no pericardial effusion. Impressions: - There is severe concentric left ventricular hypertrophy with grade 2 diastolic dysfunction and elevated filling pressures and severe left atrial enlargement. These findings together with low voltage ECG are highly suspicious of cardiac amyloidosis. Further evaluation with cardiac MRI and abdominal fat biopsy is recommended.  Assessment/Plan:  Severe HTN with LVH: BP elevated today.  Review of log reveals very poor control with average BP ~ 190/110. Complaint with Amlodipine 10 mg daily, clonidine 0.2 mg TID, hydralazine 100 mg TID, labetalol 300 mg BID, imdur 30mg  daily, lasix 80mg  daily. Will proceed with w/u for secondary HTN including renal artery duplex, urine metanephrines/catecholamines, 24-hour urine protein, ACEi level, renin/aldosterone  ratios, CMET, thyroid function, sleep study, and 24 hour cortisol. In the meantime will increase labetalol to 400mg  BID and increase imdur from 30mg  to 60mg  daily. I will have him seen in the BP clinic in 2 weeks. Discussed with Dr. Rayann Heman who suggested asking Dr. Rhett Bannister if we could possibly add an ACEi. I will route my note to him to ask.   CKD stage IV: baseline creat ~3.8. Followed by Dr. Burman Foster.   Tobacco abuse:    Total time spent with patient was over 40 minutes which included evaluating patient, reviewing record and coordinating care. Face to face time >50%. I discussed case with Dr. Rayann Heman ( the doc of the day) and had a long discussion with the patient. Several diagnostic studies were ordered. We also had to discuss proper lab orders with Dr. Radford Pax.   Current medicines are reviewed with the patient today.  The patient does  not have concerns regarding medicines other than what has been noted above.  The following changes have been made:  See above.  Labs/ tests ordered today include:   No orders of the defined types were placed in this encounter.    Disposition:   FU with Dr. Marlou Porch in one month.    Patient is agreeable to this plan and will call if any problems develop in the interim.   SignedTruitt Merle, NP  07/10/2017 7:29 AM  [facility] [address] [place], [place]  [address] Phone: [phone] Fax: [phone]

## 2017-07-11 ENCOUNTER — Encounter: Payer: Self-pay | Admitting: Nurse Practitioner

## 2017-07-20 MED FILL — hydrALAZINE HCL 100 MG TABS: 100 | 30 days supply | Qty: 90 | Fill #6

## 2017-07-20 MED FILL — AMLODIPINE BESYLATE 10 MG T: 10 | 30 days supply | Qty: 30 | Fill #6

## 2017-07-24 MED FILL — LABETALOL HCL 200 MG TABLET: 200 | 30 days supply | Qty: 180 | Fill #1

## 2017-07-25 ENCOUNTER — Telehealth: Payer: Self-pay

## 2017-07-26 ENCOUNTER — Other Ambulatory Visit: Payer: Self-pay

## 2017-07-26 MED ORDER — TRAZODONE HCL 50 MG PO TABS: 50.0000 mg | ORAL_TABLET | Freq: Every day | ORAL | 0 refills | Status: DC

## 2017-07-26 NOTE — Telephone Encounter (Signed)
Left a vm for patient to schedule a appointment and medication was approved for a 15 day supply.

## 2017-07-26 NOTE — Telephone Encounter (Signed)
Request that patient reschedule a follow-up. I will provide a 15 day supply of trazodone only.    Stanley [name]. Kenton Kingfisher, MSN, FNP-C [facility]  [address] [place], [place] [address] [phone]

## 2017-07-26 NOTE — Telephone Encounter (Signed)
Duplicate

## 2017-08-14 ENCOUNTER — Other Ambulatory Visit: Payer: Self-pay | Admitting: Internal Medicine

## 2017-08-14 MED FILL — AMLODIPINE BESYLATE 10 MG T: 10 | 30 days supply | Qty: 30 | Fill #7

## 2017-08-14 MED FILL — ATORVASTATIN 40 MG TABLET: 40 | 30 days supply | Qty: 30 | Fill #0

## 2017-08-21 ENCOUNTER — Ambulatory Visit: Payer: Self-pay | Admitting: Family Medicine

## 2017-08-30 ENCOUNTER — Ambulatory Visit: Payer: Self-pay | Admitting: Family Medicine

## 2017-09-05 MED FILL — cloNIDine HCL 0.2 MG TABS: 0.2 | 30 days supply | Qty: 90 | Fill #2

## 2017-09-18 MED FILL — hydrALAZINE HCL 100 MG TABS: 100 | 30 days supply | Qty: 90 | Fill #0

## 2017-11-01 MED FILL — LABETALOL HCL 200 MG TABLET: 200 | 30 days supply | Qty: 180 | Fill #2

## 2017-11-01 MED FILL — ?CLONIDINE HCL 0.2 MG TABLE: 0.2 | 30 days supply | Qty: 90 | Fill #3

## 2017-11-01 MED FILL — AMLODIPINE BESYLATE 10 MG T: 10 | 30 days supply | Qty: 30 | Fill #8

## 2017-11-01 MED FILL — ?ATORVASTATIN 40MG TABLET: 40 | 30 days supply | Qty: 30 | Fill #1

## 2017-11-03 ENCOUNTER — Ambulatory Visit: Payer: Self-pay | Admitting: Family Medicine

## 2017-11-20 ENCOUNTER — Other Ambulatory Visit: Payer: Self-pay | Admitting: Physician Assistant

## 2017-11-20 MED FILL — hydrALAZINE HCL 100 MG TABS: 100 | 30 days supply | Qty: 90 | Fill #0

## 2017-11-21 ENCOUNTER — Other Ambulatory Visit: Payer: Self-pay | Admitting: Cardiology

## 2017-11-21 MED ORDER — HYDRALAZINE HCL 100 MG PO TABS: 100.0000 mg | ORAL_TABLET | Freq: Three times a day (TID) | ORAL | 0 refills | Status: DC

## 2017-11-21 NOTE — Telephone Encounter (Signed)
Pt's medication was sent to pt's pharmacy as requested. Confirmation received.  °

## 2017-11-24 ENCOUNTER — Encounter: Payer: Self-pay | Admitting: Family Medicine

## 2017-11-24 ENCOUNTER — Ambulatory Visit (INDEPENDENT_AMBULATORY_CARE_PROVIDER_SITE_OTHER): Payer: Self-pay | Admitting: Family Medicine

## 2017-11-24 VITALS — BP 178/100 | HR 80 | Temp 98.0°F | Resp 16 | Ht 70.0 in | Wt 212.0 lb

## 2017-11-24 DIAGNOSIS — I509 Heart failure, unspecified: Secondary | ICD-10-CM

## 2017-11-24 DIAGNOSIS — Z131 Encounter for screening for diabetes mellitus: Secondary | ICD-10-CM

## 2017-11-24 DIAGNOSIS — N184 Chronic kidney disease, stage 4 (severe): Secondary | ICD-10-CM

## 2017-11-24 DIAGNOSIS — I10 Essential (primary) hypertension: Secondary | ICD-10-CM

## 2017-11-24 LAB — POCT URINALYSIS DIP (DEVICE)
Bilirubin Urine: NEGATIVE
GLUCOSE, UA: NEGATIVE mg/dL
Hgb urine dipstick: NEGATIVE
Ketones, ur: NEGATIVE mg/dL
Leukocytes, UA: NEGATIVE
Nitrite: NEGATIVE
PH: 6 (ref 5.0–8.0)
SPECIFIC GRAVITY, URINE: 1.025 (ref 1.005–1.030)
Urobilinogen, UA: 0.2 mg/dL (ref 0.0–1.0)

## 2017-11-24 LAB — POCT GLYCOSYLATED HEMOGLOBIN (HGB A1C): Hemoglobin A1C: 5.5

## 2017-11-24 MED ORDER — CLONIDINE HCL 0.2 MG PO TABS
0.2000 mg | ORAL_TABLET | Freq: Once | ORAL | Status: AC
  Administered 2017-11-24: 0.2 mg via ORAL

## 2017-11-24 MED ORDER — FUROSEMIDE 20 MG PO TABS: 20.0000 mg | ORAL_TABLET | Freq: Every day | ORAL | 3 refills | Status: DC | PRN

## 2017-11-24 MED ORDER — AMLODIPINE BESYLATE 10 MG PO TABS: 10.0000 mg | ORAL_TABLET | Freq: Every day | ORAL | 2 refills | Status: DC

## 2017-11-24 MED ORDER — POTASSIUM CHLORIDE CRYS ER 20 MEQ PO TBCR: 20.0000 meq | EXTENDED_RELEASE_TABLET | Freq: Every day | ORAL | 3 refills | Status: DC

## 2017-11-24 MED ORDER — ASPIRIN EC 81 MG PO TBEC: 81.0000 mg | DELAYED_RELEASE_TABLET | Freq: Every day | ORAL | 1 refills | Status: DC | PRN

## 2017-11-24 MED ORDER — HYDRALAZINE HCL 100 MG PO TABS: 100.0000 mg | ORAL_TABLET | Freq: Three times a day (TID) | ORAL | 3 refills | Status: DC

## 2017-11-24 MED ORDER — CLONIDINE HCL 0.1 MG PO TABS
0.2000 mg | ORAL_TABLET | Freq: Once | ORAL | Status: AC
  Administered 2017-11-24: 0.2 mg via ORAL

## 2017-11-24 MED ORDER — CLONIDINE HCL 0.2 MG PO TABS: 0.2000 mg | ORAL_TABLET | Freq: Three times a day (TID) | ORAL | 1 refills | Status: DC

## 2017-11-24 MED ORDER — ALBUTEROL SULFATE HFA 108 (90 BASE) MCG/ACT IN AERS: 2.0000 | INHALATION_SPRAY | RESPIRATORY_TRACT | 1 refills | Status: DC | PRN

## 2017-11-24 MED ORDER — TRAZODONE HCL 50 MG PO TABS: 50.0000 mg | ORAL_TABLET | Freq: Every day | ORAL | 1 refills | Status: DC

## 2017-11-24 MED FILL — AMLODIPINE BESYLATE 10 MG T: 10 | 30 days supply | Qty: 30 | Fill #0

## 2017-11-24 MED FILL — ?FUROSEMIDE 20 MG TABLET: 20 | 30 days supply | Qty: 30 | Fill #0

## 2017-11-24 MED FILL — POTASSIUM CL ER 20 MEQ TAB: 20 | 30 days supply | Qty: 30 | Fill #0

## 2017-11-24 MED FILL — ?CLONIDINE HCL 0.2 MG TABLE: 0.2 | 30 days supply | Qty: 90 | Fill #0

## 2017-11-24 MED FILL — traZODone HCL 50 MG TABS: 50 | 30 days supply | Qty: 30 | Fill #0

## 2017-11-24 NOTE — Patient Instructions (Addendum)
Your blood pressure is very elevated today. You have been treated with 0.4 mg Clonidine. Recheck you BP in 1 hour if you blood pressure remains 170/100, go immediately to the ED.  Start Lasix 20 mg as needed for swelling. Take potassium 20 meq with each dose of Lasix. Increased clonidine 0.2 3 times daily for blood pressure control.   Hypertension Hypertension is another name for high blood pressure. High blood pressure forces your heart to work harder to pump blood. This can cause problems over time. There are two numbers in a blood pressure reading. There is a top number (systolic) over a bottom number (diastolic). It is best to have a blood pressure below 120/80. Healthy choices can help lower your blood pressure. You may need medicine to help lower your blood pressure if:  Your blood pressure cannot be lowered with healthy choices.  Your blood pressure is higher than 130/80.  Follow these instructions at home: Eating and drinking  If directed, follow the DASH eating plan. This diet includes: ? Filling half of your plate at each meal with fruits and vegetables. ? Filling one quarter of your plate at each meal with whole grains. Whole grains include whole wheat pasta, brown rice, and whole grain bread. ? Eating or drinking low-fat dairy products, such as skim milk or low-fat yogurt. ? Filling one quarter of your plate at each meal with low-fat (lean) proteins. Low-fat proteins include fish, skinless chicken, eggs, beans, and tofu. ? Avoiding fatty meat, cured and processed meat, or chicken with skin. ? Avoiding premade or processed food.  Eat less than 1,500 mg of salt (sodium) a day.  Limit alcohol use to no more than 1 drink a day for nonpregnant women and 2 drinks a day for men. One drink equals 12 oz of beer, 5 oz of wine, or 1 oz of hard liquor. Lifestyle  Work with your doctor to stay at a healthy weight or to lose weight. Ask your doctor what the best weight is for you.  Get  at least 30 minutes of exercise that causes your heart to beat faster (aerobic exercise) most days of the week. This may include walking, swimming, or biking.  Get at least 30 minutes of exercise that strengthens your muscles (resistance exercise) at least 3 days a week. This may include lifting weights or pilates.  Do not use any products that contain nicotine or tobacco. This includes cigarettes and e-cigarettes. If you need help quitting, ask your doctor.  Check your blood pressure at home as told by your doctor.  Keep all follow-up visits as told by your doctor. This is important. Medicines  Take over-the-counter and prescription medicines only as told by your doctor. Follow directions carefully.  Do not skip doses of blood pressure medicine. The medicine does not work as well if you skip doses. Skipping doses also puts you at risk for problems.  Ask your doctor about side effects or reactions to medicines that you should watch for. Contact a doctor if:  You think you are having a reaction to the medicine you are taking.  You have headaches that keep coming back (recurring).  You feel dizzy.  You have swelling in your ankles.  You have trouble with your vision. Get help right away if:  You get a very bad headache.  You start to feel confused.  You feel weak or numb.  You feel faint.  You get very bad pain in your: ? Chest. ? Belly (abdomen).  You throw up (vomit) more than once.  You have trouble breathing. Summary  Hypertension is another name for high blood pressure.  Making healthy choices can help lower blood pressure. If your blood pressure cannot be controlled with healthy choices, you may need to take medicine. This information is not intended to replace advice given to you by your health care provider. Make sure you discuss any questions you have with your health care provider. Document Released: 03/07/2008 Document Revised: 08/17/2016 Document Reviewed:  08/17/2016 Elsevier Interactive Patient Education  [employer].

## 2017-11-24 NOTE — Progress Notes (Signed)
Clonidine

## 2017-11-24 NOTE — Progress Notes (Signed)
Patient ID: BESSIE LIVINGOOD, male    DOB: Sep 01, 1976, 42 y.o.   MRN: [id number]  PCP: Scot Jun, FNP  Chief Complaint  Patient presents with  . Follow-up    chronic conditions  . Anxiety   Subjective:  HPI Avelino S Pavlak is a 42 y.o. male with low health literacy, presents for hypertension follow-up. Medical problems include low health literacy, current everyday tobacco use, malignant hypertension, CKD stage 4, hyperparathyroidism, and LVH. Kalijah has a history of medication non-compliance and chronic missed follow-up appointments. He has suffered from uncontrolled hypertension and reports today that he is not routinely monitoring his blood pressure at home. Reports that he has been taking his medication, although has missed a few doses as he was out of refills and required an office visit prior to any further medication refills being authorized. He remains sedentary. Suffers from CKD and reports that he continues to be followed by [facility]. Nithin denies headaches, cough, PND, dizziness, weakness. However, he endorses 1 episode of chest tightness over one week ago which occurred with deep breathing. Location was generalized. Episode resolved with tylenol and rest. He has an upcoming appointment with cardiology 11/28/2017. He endorses lower extremity edema and occasional SOB- reports compliance with lasix. He is not adherent to low sodium diet and is primarily sedentary. Current Body mass index is 30.42 kg/m. Social History   Socioeconomic History  . Marital status: Married    Spouse name: Not on file  . Number of children: Not on file  . Years of education: Not on file  . Highest education level: Not on file  Social Needs  . Financial resource strain: Not on file  . Food insecurity - worry: Not on file  . Food insecurity - inability: Not on file  . Transportation needs - medical: Not on file  . Transportation needs - non-medical: Not on file  Occupational History  . Not on file   Tobacco Use  . Smoking status: Current Every Day Smoker    Packs/day: 1.00    Years: 30.00    Pack years: 30.00    Types: Cigarettes  . Smokeless tobacco: Never Used  Substance and Sexual Activity  . Alcohol use: No  . Drug use: Yes    Types: Marijuana    Comment: 06/13/2017 "not often"  . Sexual activity: Not Currently  Other Topics Concern  . Not on file  Social History Narrative  . Not on file    Family History  Problem Relation Age of Onset  . Hypertension Mother   . Hypertension Brother   . Diabetes type II Brother     Review of Systems  Constitutional: Negative.   HENT: Negative.   Respiratory: Negative for cough, choking and shortness of breath.   Cardiovascular: Positive for leg swelling. Negative for palpitations.  Gastrointestinal: Negative.   Genitourinary: Negative.   Musculoskeletal: Negative.   Neurological: Negative.   Psychiatric/Behavioral: Negative.     Patient Active Problem List   Diagnosis Date Noted  . Hypertensive urgency 06/12/2017  . CKD stage 4 secondary to hypertension ([place]) 03/01/2017  . Anemia, chronic disease 03/01/2017  . Hyperparathyroidism, secondary renal ([place]) 03/01/2017  . Acute dyspnea   . Tobacco abuse 09/26/2016  . Elevated troponin 09/26/2016  . Hypertension 09/26/2016  . Hypokalemia 09/26/2016  . AKI (acute kidney injury) ([place]) 09/26/2016    Allergies  Allergen Reactions  . Lactose Intolerance ([place]) Nausea And Vomiting  . Other Swelling    All beans  .  Nitroglycerin Anxiety and Other (See Comments)    Pain and feels like he is on fire    Prior to Admission medications   Medication Sig Start Date End Date Taking? Authorizing Provider  albuterol (PROVENTIL HFA;VENTOLIN HFA) 108 (90 Base) MCG/ACT inhaler Inhale 2 puffs into the lungs every 4 (four) hours as needed for wheezing or shortness of breath (cough, shortness of breath or wheezing.). 06/25/17  Yes Scot Jun, FNP  aspirin EC 81 MG tablet Take 1 tablet  (81 mg total) by mouth daily as needed for mild pain. 06/16/17  Yes Hosie Poisson, MD  cloNIDine (CATAPRES) 0.3 MG tablet Take 1 tablet (0.3 mg total) by mouth 3 (three) times daily. 06/16/17  Yes Hosie Poisson, MD  gemfibrozil (LOPID) 600 MG tablet Take 600 mg by mouth 2 (two) times daily before a meal.   Yes [provider]  hydrALAZINE (APRESOLINE) 100 MG tablet Take 1 tablet (100 mg total) by mouth 3 (three) times daily. Please keep upcoming appt for future refills. Thank you 11/21/17  Yes Jerline Pain, MD  hydrochlorothiazide (HYDRODIURIL) 25 MG tablet Take 25 mg by mouth daily.   Yes [provider]  labetalol (NORMODYNE) 200 MG tablet Take 2 tablets (400 mg total) by mouth 3 (three) times daily. 06/16/17  Yes Hosie Poisson, MD  metoprolol succinate (TOPROL-XL) 50 MG 24 hr tablet Take 50 mg by mouth daily. Take with or immediately following a meal.   Yes [provider]  Multiple Vitamin (MULTIVITAMIN WITH MINERALS) TABS tablet Take 1 tablet by mouth daily as needed.   Yes [provider]  amLODipine (NORVASC) 10 MG tablet Take 1 tablet (10 mg total) by mouth daily. Patient not taking: Reported on 11/24/2017 06/16/17   Hosie Poisson, MD  atorvastatin (LIPITOR) 40 MG tablet Take 1 tablet (40 mg total) by mouth daily at 6 PM. Patient not taking: Reported on 11/24/2017 06/16/17   Hosie Poisson, MD  atorvastatin (LIPITOR) 40 MG tablet TAKE 1 TABLET BY MOUTH DAILY AT 6 PM. Patient not taking: Reported on 11/24/2017 08/14/17   Scot Jun, FNP  nicotine (NICODERM CQ - DOSED IN MG/24 HOURS) 14 mg/24hr patch Place 1 patch (14 mg total) onto the skin daily. Patient not taking: Reported on 11/24/2017 06/16/17   Hosie Poisson, MD  traZODone (DESYREL) 50 MG tablet Take 1 tablet (50 mg total) by mouth at bedtime. Patient not taking: Reported on 11/24/2017 07/26/17   Scot Jun, FNP    Past Medical, Surgical Family and Social History reviewed and updated.     Objective:   Today's Vitals   11/24/17 1458 11/24/17 1527  BP: (!) 190/110 (!) 168/100  Pulse: 80   Resp: 16   Temp: 98 F (36.7 C)   TempSrc: Oral   SpO2: 98%   Weight: 212 lb (96.2 kg)   Height: 5\' 10"  (1.778 m)     Wt Readings from Last 3 Encounters:  11/24/17 212 lb (96.2 kg)  06/20/17 203 lb (92.1 kg)  06/16/17 193 lb 6.4 oz (87.7 kg)    Physical Exam  Constitutional: He is oriented to person, place, and time. He appears well-developed and well-nourished.  HENT:  Head: Normocephalic and atraumatic.  Eyes: Conjunctivae and EOM are normal. Pupils are equal, round, and reactive to light.  Neck: Normal range of motion. Neck supple. No JVD present. Carotid bruit is not present.  Cardiovascular: Normal rate, regular rhythm, normal heart sounds and intact distal pulses.  Pulmonary/Chest: Effort normal  and breath sounds normal.  Musculoskeletal: Normal range of motion.  Neurological: He is alert and oriented to person, place, and time. He has normal reflexes.  Skin: Skin is warm and dry.  Psychiatric: He has a normal mood and affect. His behavior is normal. Judgment and thought content normal.   Assessment & Plan:  1. Chronic congestive heart failure, unspecified heart failure type ([place]), symptomatic today. Will trial albuterol inhaler 2 puffs every 4-6 hours as needed for shortness of breath.   2. Accelerated hypertension, 190/110 on arrival. Ordered 0.2 mg of clonidine x 2 doses as BP remained elevated. Patient given strict precautions to follow-up at the ED if recheck of BP in 1 hour remains greater or equal to 170/100, go immediately to the ED. We have discussed target BP range and blood pressure goal. I have advised patient to check BP regularly and to call us back or report to clinic if the numbers are consistently higher than 140/90. We discussed the importance of compliance with medical therapy and DASH diet recommended, consequences of uncontrolled hypertension  discussed. Continue current regimen, adding Clonidine 0.2 mg TID. Keep follow-up with cardiology.  3. CKD (chronic kidney disease) stage 4, GFR 15-29 ml/min (HCC), currently not receiving HD. Will check CMP w/GFR. Continue follow-up with [facility].   4. Screening for diabetes mellitus, A1C 5.5, normal, repeat in 12 months.    Meds ordered this encounter  Medications  . cloNIDine (CATAPRES) tablet 0.2 mg  . traZODone (DESYREL) 50 MG tablet    Sig: Take 1 tablet (50 mg total) by mouth at bedtime.    Dispense:  90 tablet    Refill:  1    Order Specific Question:   Supervising Provider    Answer:   Tresa Garter [id number]  . hydrALAZINE (APRESOLINE) 100 MG tablet    Sig: Take 1 tablet (100 mg total) by mouth 3 (three) times daily.    Dispense:  90 tablet    Refill:  3    Order Specific Question:   Supervising Provider    Answer:   Tresa Garter [id number]  . cloNIDine (CATAPRES) 0.2 MG tablet    Sig: Take 1 tablet (0.2 mg total) by mouth 3 (three) times daily.    Dispense:  90 tablet    Refill:  1    Order Specific Question:   Supervising Provider    Answer:   Tresa Garter [id number]  . aspirin EC 81 MG tablet    Sig: Take 1 tablet (81 mg total) by mouth daily as needed for mild pain.    Dispense:  90 tablet    Refill:  1    Order Specific Question:   Supervising Provider    Answer:   Tresa Garter [id number]  . amLODipine (NORVASC) 10 MG tablet    Sig: Take 1 tablet (10 mg total) by mouth daily.    Dispense:  90 tablet    Refill:  2    Order Specific Question:   Supervising Provider    Answer:   Tresa Garter [id number]  . albuterol (PROVENTIL HFA;VENTOLIN HFA) 108 (90 Base) MCG/ACT inhaler    Sig: Inhale 2 puffs into the lungs every 4 (four) hours as needed for wheezing or shortness of breath (cough, shortness of breath or wheezing.).    Dispense:  1 Inhaler    Refill:  1    Order Specific Question:   Supervising Provider    Answer:    Angelica Chessman  E [[id number]]  . cloNIDine (CATAPRES) tablet 0.2 mg  . furosemide (LASIX) 20 MG tablet    Sig: Take 1 tablet (20 mg total) by mouth daily as needed.    Dispense:  90 tablet    Refill:  3    Order Specific Question:   Supervising Provider    Answer:   Tresa Garter [id number]  . potassium chloride SA (K-DUR,KLOR-CON) 20 MEQ tablet    Sig: Take 1 tablet (20 mEq total) by mouth daily.    Dispense:  30 tablet    Refill:  3    Order Specific Question:   Supervising Provider    Answer:   Tresa Garter [id number]    Orders Placed This Encounter  Procedures  . CBC with Differential  . Comprehensive metabolic panel  . Brain natriuretic peptide  . TSH  . POCT glycosylated hemoglobin (Hb A1C)  . POCT urinalysis dip (device)     RTC: 3 weeks for hypertension management     Carroll Sage. Kenton Kingfisher, MSN, FNP-C [facility]  [address] [place], [place] [address] [phone]

## 2017-11-26 LAB — COMPREHENSIVE METABOLIC PANEL
ALT: 13 IU/L (ref 0–44)
AST: 14 IU/L (ref 0–40)
Albumin/Globulin Ratio: 1.5 (ref 1.2–2.2)
Albumin: 4.2 g/dL (ref 3.5–5.5)
Alkaline Phosphatase: 93 IU/L (ref 39–117)
BILIRUBIN TOTAL: 0.5 mg/dL (ref 0.0–1.2)
BUN/Creatinine Ratio: 8 — ABNORMAL LOW (ref 9–20)
BUN: 24 mg/dL (ref 6–24)
CALCIUM: 9.1 mg/dL (ref 8.7–10.2)
CO2: 22 mmol/L (ref 20–29)
Chloride: 104 mmol/L (ref 96–106)
Creatinine, Ser: 2.86 mg/dL — ABNORMAL HIGH (ref 0.76–1.27)
GFR calc non Af Amer: 26 mL/min/{1.73_m2} — ABNORMAL LOW (ref >59)
GFR, EST AFRICAN AMERICAN: 30 mL/min/{1.73_m2} — AB (ref >59)
GLUCOSE: 83 mg/dL (ref 65–99)
Globulin, Total: 2.8 g/dL (ref 1.5–4.5)
Potassium: 4.1 mmol/L (ref 3.5–5.2)
Sodium: 139 mmol/L (ref 134–144)
TOTAL PROTEIN: 7 g/dL (ref 6.0–8.5)

## 2017-11-26 LAB — CBC WITH DIFFERENTIAL/PLATELET
BASOS ABS: 0.1 10*3/uL (ref 0.0–0.2)
Basos: 1 %
EOS (ABSOLUTE): 0.2 10*3/uL (ref 0.0–0.4)
Eos: 4 %
Hematocrit: 34.4 % — ABNORMAL LOW (ref 37.5–51.0)
Hemoglobin: 11.3 g/dL — ABNORMAL LOW (ref 13.0–17.7)
IMMATURE GRANULOCYTES: 1 %
Immature Grans (Abs): 0 10*3/uL (ref 0.0–0.1)
Lymphocytes Absolute: 1.2 10*3/uL (ref 0.7–3.1)
Lymphs: 19 %
MCH: 28.1 pg (ref 26.6–33.0)
MCHC: 32.8 g/dL (ref 31.5–35.7)
MCV: 86 fL (ref 79–97)
MONOS ABS: 0.5 10*3/uL (ref 0.1–0.9)
Monocytes: 9 %
Neutrophils Absolute: 4.1 10*3/uL (ref 1.4–7.0)
Neutrophils: 66 %
PLATELETS: 307 10*3/uL (ref 150–379)
RBC: 4.02 x10E6/uL — ABNORMAL LOW (ref 4.14–5.80)
RDW: 15.6 % — AB (ref 12.3–15.4)
WBC: 6 10*3/uL (ref 3.4–10.8)

## 2017-11-26 LAB — TSH: TSH: 2.32 u[IU]/mL (ref 0.450–4.500)

## 2017-11-26 LAB — BRAIN NATRIURETIC PEPTIDE: BNP: 805 pg/mL — ABNORMAL HIGH (ref 0.0–100.0)

## 2017-11-27 ENCOUNTER — Telehealth: Payer: Self-pay | Admitting: Family Medicine

## 2017-11-27 NOTE — Telephone Encounter (Signed)
Left voicemail for callback.

## 2017-11-27 NOTE — Telephone Encounter (Signed)
Contact patient to advise his BNP level is elevated which indicates he is retaining fluid. I am increasing his furosemide (Lasix) 40 mg twice daily. Continue daily potassium 20 MEQ daily. Return 12/04/2017 for repeat BNP level and blood pressure check. He has a cardiology appointment tomorrow, it is important that he follow-up.   Carroll Sage. Kenton Kingfisher, MSN, FNP-C [facility]  [address] [place], [place] [address] [phone]

## 2017-11-27 NOTE — Telephone Encounter (Signed)
Unable to reach; all phone numbers listed are incorrect.

## 2017-11-27 NOTE — Telephone Encounter (Signed)
Letter sent out to patient as number is not correct in system.

## 2017-11-28 ENCOUNTER — Ambulatory Visit: Payer: Self-pay | Admitting: Cardiology

## 2017-11-29 ENCOUNTER — Encounter: Payer: Self-pay | Admitting: Cardiology

## 2017-11-29 IMAGING — CR DG CHEST 1V PORT
1 series · 1 of 1 positions shown · non-contrast
Comparison: Frontal and lateral views 05/19/2016

CLINICAL DATA: Dyspnea.  Cough and congestion.

EXAM:
PORTABLE CHEST 1 VIEW

[AP]
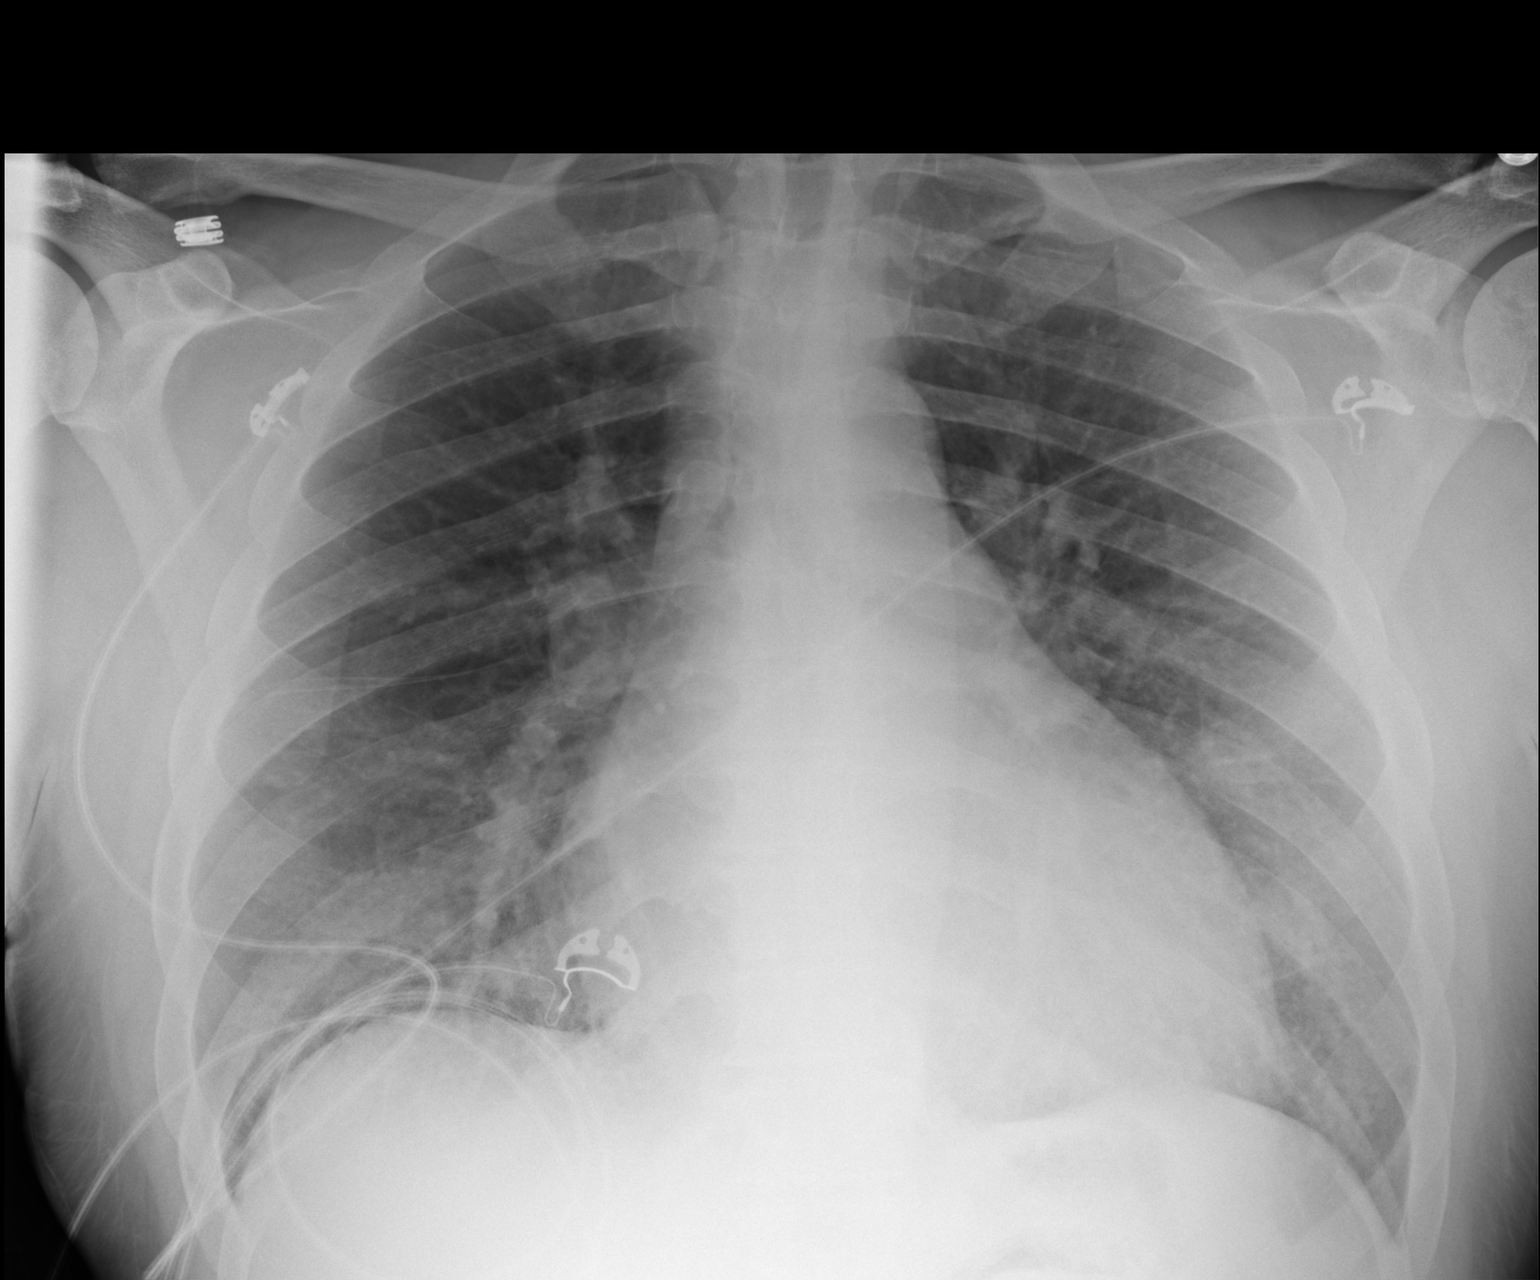

[1 of 1 positions shown; findings below may reference images not displayed]

FINDINGS: Mild cardiomegaly, likely accentuated by portable AP technique.
Normal mediastinal contours. No pulmonary edema. No focal airspace
opacity, large pleural effusion or pneumothorax. No acute osseous
abnormality is seen.
IMPRESSION: Mild cardiomegaly. No congestive failure or acute pulmonary
abnormality.

## 2017-12-22 ENCOUNTER — Encounter: Payer: Self-pay | Admitting: Cardiology

## 2017-12-22 ENCOUNTER — Ambulatory Visit: Payer: Self-pay | Admitting: Family Medicine

## 2017-12-25 MED FILL — POTASSIUM CL ER 20 MEQ TAB: 20 | 30 days supply | Qty: 30 | Fill #1

## 2017-12-25 MED FILL — FUROSEMIDE 20 MG TABLET: 20 | 30 days supply | Qty: 30 | Fill #1

## 2017-12-25 MED FILL — traZODone HCL 50 MG TABS: 50 | 30 days supply | Qty: 30 | Fill #1

## 2018-01-02 ENCOUNTER — Ambulatory Visit: Payer: Self-pay | Admitting: Cardiology

## 2018-01-10 MED FILL — hydrALAZINE HCL 100 MG TABS: 100 | 30 days supply | Qty: 90 | Fill #0

## 2018-01-16 ENCOUNTER — Emergency Department (HOSPITAL_COMMUNITY)
Admission: EM | Admit: 2018-01-16 | Discharge: 2018-01-16 | Disposition: A | Discharge status: Home / Self Care | Payer: Self-pay | Attending: Emergency Medicine | Admitting: Emergency Medicine

## 2018-01-16 ENCOUNTER — Encounter (HOSPITAL_COMMUNITY): Payer: Self-pay | Admitting: Emergency Medicine

## 2018-01-16 ENCOUNTER — Other Ambulatory Visit: Payer: Self-pay

## 2018-01-16 ENCOUNTER — Emergency Department (HOSPITAL_COMMUNITY): Payer: Self-pay

## 2018-01-16 DIAGNOSIS — F17210 Nicotine dependence, cigarettes, uncomplicated: Secondary | ICD-10-CM | POA: Insufficient documentation

## 2018-01-16 DIAGNOSIS — I129 Hypertensive chronic kidney disease with stage 1 through stage 4 chronic kidney disease, or unspecified chronic kidney disease: Secondary | ICD-10-CM | POA: Insufficient documentation

## 2018-01-16 DIAGNOSIS — N184 Chronic kidney disease, stage 4 (severe): Secondary | ICD-10-CM | POA: Insufficient documentation

## 2018-01-16 DIAGNOSIS — R519 Headache, unspecified: Secondary | ICD-10-CM

## 2018-01-16 DIAGNOSIS — R51 Headache: Secondary | ICD-10-CM | POA: Insufficient documentation

## 2018-01-16 DIAGNOSIS — I10 Essential (primary) hypertension: Secondary | ICD-10-CM

## 2018-01-16 DIAGNOSIS — Z79899 Other long term (current) drug therapy: Secondary | ICD-10-CM | POA: Insufficient documentation

## 2018-01-16 DIAGNOSIS — N189 Chronic kidney disease, unspecified: Secondary | ICD-10-CM

## 2018-01-16 LAB — COMPREHENSIVE METABOLIC PANEL
ALK PHOS: 101 U/L (ref 38–126)
ALT: 13 U/L — AB (ref 17–63)
AST: 18 U/L (ref 15–41)
Albumin: 4 g/dL (ref 3.5–5.0)
Anion gap: 12 (ref 5–15)
BILIRUBIN TOTAL: 0.7 mg/dL (ref 0.3–1.2)
BUN: 34 mg/dL — ABNORMAL HIGH (ref 6–20)
CALCIUM: 9.5 mg/dL (ref 8.9–10.3)
CO2: 23 mmol/L (ref 22–32)
CREATININE: 3.61 mg/dL — AB (ref 0.61–1.24)
Chloride: 103 mmol/L (ref 101–111)
GFR, EST AFRICAN AMERICAN: 23 mL/min — AB (ref >60)
GFR, EST NON AFRICAN AMERICAN: 20 mL/min — AB (ref >60)
Glucose, Bld: 94 mg/dL (ref 65–99)
Potassium: 3.2 mmol/L — ABNORMAL LOW (ref 3.5–5.1)
Sodium: 138 mmol/L (ref 135–145)
TOTAL PROTEIN: 7.9 g/dL (ref 6.5–8.1)

## 2018-01-16 LAB — CBC
HEMATOCRIT: 41.5 % (ref 39.0–52.0)
Hemoglobin: 13.7 g/dL (ref 13.0–17.0)
MCH: 27.8 pg (ref 26.0–34.0)
MCHC: 33 g/dL (ref 30.0–36.0)
MCV: 84.3 fL (ref 78.0–100.0)
Platelets: 317 10*3/uL (ref 150–400)
RBC: 4.92 MIL/uL (ref 4.22–5.81)
RDW: 15.1 % (ref 11.5–15.5)
WBC: 6.7 10*3/uL (ref 4.0–10.5)

## 2018-01-16 LAB — DIFFERENTIAL
Basophils Absolute: 0 10*3/uL (ref 0.0–0.1)
Basophils Relative: 0 %
EOS PCT: 1 %
Eosinophils Absolute: 0 10*3/uL (ref 0.0–0.7)
LYMPHS ABS: 1.1 10*3/uL (ref 0.7–4.0)
LYMPHS PCT: 16 %
MONO ABS: 0.3 10*3/uL (ref 0.1–1.0)
MONOS PCT: 4 %
NEUTROS ABS: 5.3 10*3/uL (ref 1.7–7.7)
Neutrophils Relative %: 79 %

## 2018-01-16 MED ORDER — ONDANSETRON HCL 4 MG/2ML IJ SOLN
4.0000 mg | Freq: Once | INTRAMUSCULAR | Status: AC
  Administered 2018-01-16: 4 mg via INTRAVENOUS
  Filled 2018-01-16: qty 2

## 2018-01-16 MED ORDER — OXYCODONE-ACETAMINOPHEN 5-325 MG PO TABS
1.0000 | ORAL_TABLET | ORAL | Status: DC | PRN
  Administered 2018-01-16: 1 via ORAL
  Filled 2018-01-16: qty 1

## 2018-01-16 MED ORDER — CLONIDINE HCL 0.2 MG PO TABS
0.3000 mg | ORAL_TABLET | Freq: Once | ORAL | Status: AC
  Administered 2018-01-16: 0.3 mg via ORAL
  Filled 2018-01-16: qty 1

## 2018-01-16 MED ORDER — LABETALOL HCL 200 MG PO TABS
400.0000 mg | ORAL_TABLET | Freq: Once | ORAL | Status: AC
  Administered 2018-01-16: 400 mg via ORAL
  Filled 2018-01-16: qty 2

## 2018-01-16 MED ORDER — AMLODIPINE BESYLATE 5 MG PO TABS
10.0000 mg | ORAL_TABLET | Freq: Once | ORAL | Status: AC
  Administered 2018-01-16: 10 mg via ORAL
  Filled 2018-01-16: qty 2

## 2018-01-16 MED ORDER — DIPHENHYDRAMINE HCL 50 MG/ML IJ SOLN
25.0000 mg | Freq: Once | INTRAMUSCULAR | Status: AC
  Administered 2018-01-16: 25 mg via INTRAVENOUS
  Filled 2018-01-16: qty 1

## 2018-01-16 MED ORDER — HYDRALAZINE HCL 50 MG PO TABS
100.0000 mg | ORAL_TABLET | Freq: Once | ORAL | Status: AC
  Administered 2018-01-16: 100 mg via ORAL
  Filled 2018-01-16: qty 2

## 2018-01-16 MED ORDER — HYDROMORPHONE HCL 2 MG/ML IJ SOLN
1.0000 mg | Freq: Once | INTRAMUSCULAR | Status: AC
  Administered 2018-01-16: 1 mg via INTRAVENOUS
  Filled 2018-01-16: qty 1

## 2018-01-16 NOTE — Discharge Instructions (Addendum)
It was our pleasure to provide your ER care today - we hope that you feel better.  Your blood pressure is high today - continue your blood pressure meds, and limit salt intake.  It is very important that you work closely with your doctor to control your blood pressure, especially as you already have kidney disease. Uncontrolled blood pressure can lead to kidney failure/dialysis, and increase the risk of stroke and heart disease.   Follow up with your doctor this week for recheck of blood pressure - call office today to arrange appointment.   Return to ER if worse, new symptoms, fevers, severe headache, trouble breathing, other concern.  You were given pain medication in the ER - no driving for the next 4 hours.

## 2018-01-16 NOTE — ED Provider Notes (Addendum)
[facility] EMERGENCY DEPARTMENT Provider Note   CSN: [id number] Arrival date & time: 01/16/18  7026     History   Chief Complaint Chief Complaint  Patient presents with  . Headache    HPI Stanley [name] is a 41 y.o. male.  Patient c/o headache to let side since this AM. Pain moderate/sev, dull, non radiating. Similar to prior head pain. Also with uncontrolled htn, states last took his meds yesterday, and has all his meds/did not run out - states despite taking meds, his blood pressure is usually always high. Denies chest pain or sob. No swelling. w headache, denies syncope. No eye pain or change in vision. No numbness/weakness or change in normal functional ability. No change in speech. Denies head injury, trauma or fall. States did have a couple episodes nv, but that has improved. No abd pain. No gu c/o.   The history is provided by the patient.  Headache   Associated symptoms include nausea and vomiting. Pertinent negatives include no fever and no shortness of breath.    Past Medical History:  Diagnosis Date  . Anxiety   . CKD (chronic kidney disease), stage IV ([place])   . Depression   . Dyslexia   . High cholesterol   . Hypertension   . Stroke ([place]) 06/03/2017   "minor; completely recovered today" (06/13/2017)  . Tobacco abuse     Patient Active Problem List   Diagnosis Date Noted  . Hypertensive urgency 06/12/2017  . CKD stage 4 secondary to hypertension ([place]) 03/01/2017  . Anemia, chronic disease 03/01/2017  . Hyperparathyroidism, secondary renal ([place]) 03/01/2017  . Acute dyspnea   . Tobacco abuse 09/26/2016  . Elevated troponin 09/26/2016  . Hypertension 09/26/2016  . Hypokalemia 09/26/2016  . AKI (acute kidney injury) ([place]) 09/26/2016    Past Surgical History:  Procedure Laterality Date  . NO PAST SURGERIES          Home Medications    Prior to Admission medications   Medication Sig Start Date End Date Taking? Authorizing Provider    albuterol (PROVENTIL HFA;VENTOLIN HFA) 108 (90 Base) MCG/ACT inhaler Inhale 2 puffs into the lungs every 4 (four) hours as needed for wheezing or shortness of breath (cough, shortness of breath or wheezing.). 11/24/17   Scot Jun, FNP  amLODipine (NORVASC) 10 MG tablet Take 1 tablet (10 mg total) by mouth daily. 11/24/17   Scot Jun, FNP  aspirin EC 81 MG tablet Take 1 tablet (81 mg total) by mouth daily as needed for mild pain. 11/24/17   Scot Jun, FNP  atorvastatin (LIPITOR) 40 MG tablet Take 1 tablet (40 mg total) by mouth daily at 6 PM. Patient not taking: Reported on 11/24/2017 06/16/17   Hosie Poisson, MD  cloNIDine (CATAPRES) 0.2 MG tablet Take 1 tablet (0.2 mg total) by mouth 3 (three) times daily. 11/24/17   Scot Jun, FNP  furosemide (LASIX) 20 MG tablet Take 1 tablet (20 mg total) by mouth daily as needed. 11/24/17   Scot Jun, FNP  gemfibrozil (LOPID) 600 MG tablet Take 600 mg by mouth 2 (two) times daily before a meal.    [provider]  hydrALAZINE (APRESOLINE) 100 MG tablet Take 1 tablet (100 mg total) by mouth 3 (three) times daily. 11/24/17   Scot Jun, FNP  labetalol (NORMODYNE) 200 MG tablet Take 2 tablets (400 mg total) by mouth 3 (three) times daily. 06/16/17   Hosie Poisson, MD  metoprolol succinate (  TOPROL-XL) 50 MG 24 hr tablet Take 50 mg by mouth daily. Take with or immediately following a meal.    [provider]  Multiple Vitamin (MULTIVITAMIN WITH MINERALS) TABS tablet Take 1 tablet by mouth daily as needed.    [provider]  nicotine (NICODERM CQ - DOSED IN MG/24 HOURS) 14 mg/24hr patch Place 1 patch (14 mg total) onto the skin daily. Patient not taking: Reported on 11/24/2017 06/16/17   Hosie Poisson, MD  potassium chloride SA (K-DUR,KLOR-CON) 20 MEQ tablet Take 1 tablet (20 mEq total) by mouth daily. 11/24/17   Scot Jun, FNP  traZODone (DESYREL) 50 MG tablet Take 1 tablet (50 mg total)  by mouth at bedtime. 11/24/17   Scot Jun, FNP    Family History Family History  Problem Relation Age of Onset  . Hypertension Mother   . Hypertension Brother   . Diabetes type II Brother     Social History Social History   Tobacco Use  . Smoking status: Current Every Day Smoker    Packs/day: 1.00    Years: 30.00    Pack years: 30.00    Types: Cigarettes  . Smokeless tobacco: Never Used  Substance Use Topics  . Alcohol use: No  . Drug use: Yes    Types: Marijuana    Comment: 06/13/2017 "not often"     Allergies   Lactose intolerance (gi); Other; and Nitroglycerin   Review of Systems Review of Systems  Constitutional: Negative for fever.  HENT: Negative for sore throat.   Eyes: Negative for pain, redness and visual disturbance.  Respiratory: Negative for shortness of breath.   Cardiovascular: Negative for chest pain.  Gastrointestinal: Positive for nausea and vomiting. Negative for abdominal pain and diarrhea.  Genitourinary: Negative for flank pain.  Musculoskeletal: Negative for back pain, neck pain and neck stiffness.  Skin: Negative for rash.  Neurological: Positive for headaches. Negative for syncope, speech difficulty and numbness.  Hematological: Does not bruise/bleed easily.  Psychiatric/Behavioral: Negative for confusion.     Physical Exam Updated Vital Signs BP (!) 229/125 (BP Location: Right Arm)   Pulse 65   Temp 98.8 F (37.1 C) (Oral)   Resp 16   Wt 97.5 kg (215 lb)   SpO2 98%   BMI 30.85 kg/m   Physical Exam  Constitutional: He is oriented to person, place, and time. He appears well-developed and well-nourished. No distress.  HENT:  Head: Atraumatic.  Mouth/Throat: Oropharynx is clear and moist.  No sinus or temporal tenderness.   Eyes: Pupils are equal, round, and reactive to light. Conjunctivae and EOM are normal.  Neck: Neck supple. No tracheal deviation present. No thyromegaly present.  Cardiovascular: Normal rate, regular  rhythm, normal heart sounds and intact distal pulses. Exam reveals no gallop and no friction rub.  No murmur heard. Pulmonary/Chest: Effort normal and breath sounds normal. No accessory muscle usage. No respiratory distress.  Abdominal: Soft. Bowel sounds are normal. He exhibits no distension. There is no tenderness.  No bruits.   Musculoskeletal: He exhibits no edema or tenderness.  Neurological: He is alert and oriented to person, place, and time. No cranial nerve deficit.  Speech clear/fluent. Motor intact bil. stre 5/5. No pronator drift. sens grossly intact. Steady gait.   Skin: Skin is warm and dry. No rash noted. He is not diaphoretic.  Psychiatric: He has a normal mood and affect.  Nursing note and vitals reviewed.    ED Treatments / Results  Labs (all labs ordered  are listed, but only abnormal results are displayed) Results for orders placed or performed during the hospital encounter of 01/16/18  CBC  Result Value Ref Range   WBC 6.7 4.0 - 10.5 K/uL   RBC 4.92 4.22 - 5.81 MIL/uL   Hemoglobin 13.7 13.0 - 17.0 g/dL   HCT 41.5 39.0 - 52.0 %   MCV 84.3 78.0 - 100.0 fL   MCH 27.8 26.0 - 34.0 pg   MCHC 33.0 30.0 - 36.0 g/dL   RDW 15.1 11.5 - 15.5 %   Platelets 317 150 - 400 K/uL  Differential  Result Value Ref Range   Neutrophils Relative % 79 %   Neutro Abs 5.3 1.7 - 7.7 K/uL   Lymphocytes Relative 16 %   Lymphs Abs 1.1 0.7 - 4.0 K/uL   Monocytes Relative 4 %   Monocytes Absolute 0.3 0.1 - 1.0 K/uL   Eosinophils Relative 1 %   Eosinophils Absolute 0.0 0.0 - 0.7 K/uL   Basophils Relative 0 %   Basophils Absolute 0.0 0.0 - 0.1 K/uL  Comprehensive metabolic panel  Result Value Ref Range   Sodium 138 135 - 145 mmol/L   Potassium 3.2 (L) 3.5 - 5.1 mmol/L   Chloride 103 101 - 111 mmol/L   CO2 23 22 - 32 mmol/L   Glucose, Bld 94 65 - 99 mg/dL   BUN 34 (H) 6 - 20 mg/dL   Creatinine, Ser 3.61 (H) 0.61 - 1.24 mg/dL   Calcium 9.5 8.9 - 10.3 mg/dL   Total Protein 7.9 6.5 -  8.1 g/dL   Albumin 4.0 3.5 - 5.0 g/dL   AST 18 15 - 41 U/L   ALT 13 (L) 17 - 63 U/L   Alkaline Phosphatase 101 38 - 126 U/L   Total Bilirubin 0.7 0.3 - 1.2 mg/dL   GFR calc non Af Amer 20 (L) >60 mL/min   GFR calc Af Amer 23 (L) >60 mL/min   Anion gap 12 5 - 15   Ct Head Wo Contrast  Result Date: 01/16/2018 CLINICAL DATA:  Headache, acute, worst of life. Posterior headache for 2 days. EXAM: CT HEAD WITHOUT CONTRAST TECHNIQUE: Contiguous axial images were obtained from the base of the skull through the vertex without intravenous contrast. COMPARISON:  06/12/2017 MRI. FINDINGS: Brain: No evidence of acute infarction, hemorrhage, hydrocephalus, extra-axial collection or mass lesion/mass effect. Few patchy low densities in the cerebral white matter, likely chronic microvascular ischemia given patient's history. These were present although partially obscured on comparison brain MRI which was significantly motion degraded. Vascular: No hyperdense vessel or unexpected calcification. Skull: Normal. Negative for fracture or focal lesion. Sinuses/Orbits: No acute finding. IMPRESSION: 1. No acute finding. 2. Mild cerebral white matter disease, likely chronic small vessel ischemia given patient's medical history. Electronically Signed   By: Monte Fantasia M.D.   On: 01/16/2018 10:12    EKG EKG Interpretation  Date/Time:  Tuesday January 16 2018 09:34:03 EDT Ventricular Rate:  63 PR Interval:  162 QRS Duration: 96 QT Interval:  480 QTC Calculation: 491 R Axis:   64 Text Interpretation:  Normal sinus rhythm Left ventricular hypertrophy with repolarization abnormality Prolonged QT Confirmed by Lajean Saver [phone]) on 01/16/2018 2:31:27 PM   Radiology Ct Head Wo Contrast  Result Date: 01/16/2018 CLINICAL DATA:  Headache, acute, worst of life. Posterior headache for 2 days. EXAM: CT HEAD WITHOUT CONTRAST TECHNIQUE: Contiguous axial images were obtained from the base of the skull through the vertex  without intravenous contrast.  COMPARISON:  06/12/2017 MRI. FINDINGS: Brain: No evidence of acute infarction, hemorrhage, hydrocephalus, extra-axial collection or mass lesion/mass effect. Few patchy low densities in the cerebral white matter, likely chronic microvascular ischemia given patient's history. These were present although partially obscured on comparison brain MRI which was significantly motion degraded. Vascular: No hyperdense vessel or unexpected calcification. Skull: Normal. Negative for fracture or focal lesion. Sinuses/Orbits: No acute finding. IMPRESSION: 1. No acute finding. 2. Mild cerebral white matter disease, likely chronic small vessel ischemia given patient's medical history. Electronically Signed   By: Monte Fantasia M.D.   On: 01/16/2018 10:12    Procedures Procedures (including critical care time)  Medications Ordered in ED Medications  oxyCODONE-acetaminophen (PERCOCET/ROXICET) 5-325 MG per tablet 1 tablet (1 tablet Oral Given 01/16/18 0926)  ondansetron (ZOFRAN) injection 4 mg (has no administration in time range)  HYDROmorphone (DILAUDID) injection 1 mg (has no administration in time range)  amLODipine (NORVASC) tablet 10 mg (has no administration in time range)  cloNIDine (CATAPRES) tablet 0.3 mg (has no administration in time range)  hydrALAZINE (APRESOLINE) tablet 100 mg (has no administration in time range)  labetalol (NORMODYNE) tablet 400 mg (has no administration in time range)  ondansetron (ZOFRAN) injection 4 mg (4 mg Intravenous Given 01/16/18 0924)     Initial Impression / Assessment and Plan / ED Course  I have reviewed the triage vital signs and the nursing notes.  Pertinent labs & imaging results that were available during my care of the patient were reviewed by me and considered in my medical decision making (see chart for details).  Iv ns. Labs. Ct.  Reviewed nursing notes and prior charts for additional history.   Dilaudid 1 mg iv.   Ct  reviewed - no acute hem.  Labs reviewed - wbc normal. Renal fxn pending.  Pt has not taken any of his bp meds yet today - pt given dose of his bp meds.   Mild itching post pain med. No rash/hives. No wheezing. Benadryl iv.   Recheck pt. No hives/rash. Chest cta.   No headache.   bp improved.   Pt with cri - labs appear c/w prior labs from 2018.   Pt denies any current c/o.  Stressed importance of bp control, including risk renal failure/dialysis, stroke and heart disease, and importance of close pcp follow up.  Patient is currently asymptomatic, and appears stable for d/c.   Pt has brought to ED his meds, and has adequate supply of his bp meds.   Vitals:   01/16/18 1300 01/16/18 1400  BP: (!) 185/114 (!) 155/96  Pulse: 63 64  Resp: 16 16  Temp:    SpO2: 96% 96%     Final Clinical Impressions(s) / ED Diagnoses   Final diagnoses:  None    ED Discharge Orders    None             Lajean Saver, MD 01/16/18 1448

## 2018-01-16 NOTE — ED Notes (Signed)
PT sitting up in bed eating a meal. Pain 0/10

## 2018-01-16 NOTE — ED Triage Notes (Signed)
Pt started having headache last night. Pt has hx of HTN, does not always take his medication. Started having N/V last night and chills. Pt helping friend yesterday in stressful situation, and strained shoulder. BP per ems 244/145, Pain 5/10. CBG 101, HR 66.

## 2018-01-29 MED FILL — traZODone HCL 50 MG TABS: 50 | 30 days supply | Qty: 30 | Fill #2

## 2018-01-29 MED FILL — POTASSIUM CL ER 20 MEQ TAB: 20 | 30 days supply | Qty: 30 | Fill #2

## 2018-01-29 MED FILL — FUROSEMIDE 20 MG TABLET: 20 | 30 days supply | Qty: 30 | Fill #2

## 2018-02-10 ENCOUNTER — Inpatient Hospital Stay (HOSPITAL_COMMUNITY)
Admission: EM | Admit: 2018-02-10 | Discharge: 2018-02-14 | DRG: 305 | Disposition: A | Discharge status: Home / Self Care | Payer: Self-pay | Attending: Internal Medicine | Admitting: Internal Medicine

## 2018-02-10 ENCOUNTER — Emergency Department (HOSPITAL_COMMUNITY): Payer: Self-pay

## 2018-02-10 ENCOUNTER — Inpatient Hospital Stay (HOSPITAL_COMMUNITY): Payer: Self-pay

## 2018-02-10 ENCOUNTER — Other Ambulatory Visit (HOSPITAL_COMMUNITY): Payer: Self-pay

## 2018-02-10 ENCOUNTER — Encounter (HOSPITAL_COMMUNITY): Payer: Self-pay

## 2018-02-10 DIAGNOSIS — F419 Anxiety disorder, unspecified: Secondary | ICD-10-CM | POA: Diagnosis present

## 2018-02-10 DIAGNOSIS — Z888 Allergy status to other drugs, medicaments and biological substances status: Secondary | ICD-10-CM

## 2018-02-10 DIAGNOSIS — H539 Unspecified visual disturbance: Secondary | ICD-10-CM

## 2018-02-10 DIAGNOSIS — I5032 Chronic diastolic (congestive) heart failure: Secondary | ICD-10-CM | POA: Diagnosis present

## 2018-02-10 DIAGNOSIS — R55 Syncope and collapse: Secondary | ICD-10-CM

## 2018-02-10 DIAGNOSIS — H538 Other visual disturbances: Secondary | ICD-10-CM | POA: Diagnosis present

## 2018-02-10 DIAGNOSIS — N184 Chronic kidney disease, stage 4 (severe): Secondary | ICD-10-CM | POA: Diagnosis present

## 2018-02-10 DIAGNOSIS — Z79899 Other long term (current) drug therapy: Secondary | ICD-10-CM

## 2018-02-10 DIAGNOSIS — Z9119 Patient's noncompliance with other medical treatment and regimen: Secondary | ICD-10-CM

## 2018-02-10 DIAGNOSIS — Z8673 Personal history of transient ischemic attack (TIA), and cerebral infarction without residual deficits: Secondary | ICD-10-CM

## 2018-02-10 DIAGNOSIS — E739 Lactose intolerance, unspecified: Secondary | ICD-10-CM | POA: Diagnosis present

## 2018-02-10 DIAGNOSIS — E7800 Pure hypercholesterolemia, unspecified: Secondary | ICD-10-CM | POA: Diagnosis present

## 2018-02-10 DIAGNOSIS — I160 Hypertensive urgency: Secondary | ICD-10-CM | POA: Diagnosis present

## 2018-02-10 DIAGNOSIS — N433 Hydrocele, unspecified: Secondary | ICD-10-CM

## 2018-02-10 DIAGNOSIS — F329 Major depressive disorder, single episode, unspecified: Secondary | ICD-10-CM | POA: Diagnosis present

## 2018-02-10 DIAGNOSIS — I130 Hypertensive heart and chronic kidney disease with heart failure and stage 1 through stage 4 chronic kidney disease, or unspecified chronic kidney disease: Secondary | ICD-10-CM | POA: Diagnosis present

## 2018-02-10 DIAGNOSIS — N5089 Other specified disorders of the male genital organs: Secondary | ICD-10-CM | POA: Diagnosis present

## 2018-02-10 DIAGNOSIS — N4400 Torsion of testis, unspecified: Secondary | ICD-10-CM

## 2018-02-10 DIAGNOSIS — F1210 Cannabis abuse, uncomplicated: Secondary | ICD-10-CM | POA: Diagnosis present

## 2018-02-10 DIAGNOSIS — Z7982 Long term (current) use of aspirin: Secondary | ICD-10-CM

## 2018-02-10 DIAGNOSIS — I959 Hypotension, unspecified: Secondary | ICD-10-CM | POA: Diagnosis not present

## 2018-02-10 DIAGNOSIS — F17210 Nicotine dependence, cigarettes, uncomplicated: Secondary | ICD-10-CM | POA: Diagnosis present

## 2018-02-10 DIAGNOSIS — I161 Hypertensive emergency: Principal | ICD-10-CM

## 2018-02-10 DIAGNOSIS — Z8249 Family history of ischemic heart disease and other diseases of the circulatory system: Secondary | ICD-10-CM

## 2018-02-10 DIAGNOSIS — R480 Dyslexia and alexia: Secondary | ICD-10-CM | POA: Diagnosis present

## 2018-02-10 LAB — RAPID URINE DRUG SCREEN, HOSP PERFORMED
Amphetamines: NOT DETECTED
Barbiturates: NOT DETECTED
Benzodiazepines: NOT DETECTED
COCAINE: NOT DETECTED
Opiates: NOT DETECTED
TETRAHYDROCANNABINOL: POSITIVE — AB

## 2018-02-10 LAB — URINALYSIS, ROUTINE W REFLEX MICROSCOPIC
BACTERIA UA: NONE SEEN
BILIRUBIN URINE: NEGATIVE
Glucose, UA: NEGATIVE mg/dL
HGB URINE DIPSTICK: NEGATIVE
KETONES UR: NEGATIVE mg/dL
Leukocytes, UA: NEGATIVE
Nitrite: NEGATIVE
PH: 6 (ref 5.0–8.0)
Protein, ur: 100 mg/dL — AB
SPECIFIC GRAVITY, URINE: 1.009 (ref 1.005–1.030)

## 2018-02-10 LAB — CBC
HEMATOCRIT: 37.8 % — AB (ref 39.0–52.0)
Hemoglobin: 12.3 g/dL — ABNORMAL LOW (ref 13.0–17.0)
MCH: 27.6 pg (ref 26.0–34.0)
MCHC: 32.5 g/dL (ref 30.0–36.0)
MCV: 84.8 fL (ref 78.0–100.0)
Platelets: 313 10*3/uL (ref 150–400)
RBC: 4.46 MIL/uL (ref 4.22–5.81)
RDW: 15.1 % (ref 11.5–15.5)
WBC: 5.1 10*3/uL (ref 4.0–10.5)

## 2018-02-10 LAB — I-STAT TROPONIN, ED: Troponin i, poc: 0.04 ng/mL (ref 0.00–0.08)

## 2018-02-10 LAB — BASIC METABOLIC PANEL
Anion gap: 11 (ref 5–15)
BUN: 24 mg/dL — AB (ref 6–20)
CHLORIDE: 105 mmol/L (ref 101–111)
CO2: 24 mmol/L (ref 22–32)
CREATININE: 3.18 mg/dL — AB (ref 0.61–1.24)
Calcium: 9.1 mg/dL (ref 8.9–10.3)
GFR calc Af Amer: 26 mL/min — ABNORMAL LOW (ref >60)
GFR calc non Af Amer: 23 mL/min — ABNORMAL LOW (ref >60)
GLUCOSE: 88 mg/dL (ref 65–99)
Potassium: 3.6 mmol/L (ref 3.5–5.1)
SODIUM: 140 mmol/L (ref 135–145)

## 2018-02-10 LAB — CBG MONITORING, ED: Glucose-Capillary: 85 mg/dL (ref 65–99)

## 2018-02-10 LAB — BRAIN NATRIURETIC PEPTIDE: B NATRIURETIC PEPTIDE 5: 585.3 pg/mL — AB (ref 0.0–100.0)

## 2018-02-10 LAB — MAGNESIUM: Magnesium: 2.2 mg/dL (ref 1.7–2.4)

## 2018-02-10 MED ORDER — NICARDIPINE HCL IN NACL 20-0.86 MG/200ML-% IV SOLN
3.0000 mg/h | INTRAVENOUS | Status: DC
  Administered 2018-02-10: 15 mg/h via INTRAVENOUS
  Filled 2018-02-10 (×2): qty 200

## 2018-02-10 MED ORDER — LABETALOL HCL 200 MG PO TABS
400.0000 mg | ORAL_TABLET | Freq: Three times a day (TID) | ORAL | Status: DC
  Administered 2018-02-10 – 2018-02-14 (×11): 400 mg via ORAL
  Filled 2018-02-10 (×11): qty 2

## 2018-02-10 MED ORDER — HEPARIN SODIUM (PORCINE) 5000 UNIT/ML IJ SOLN
5000.0000 [IU] | Freq: Three times a day (TID) | INTRAMUSCULAR | Status: DC
  Administered 2018-02-10 – 2018-02-13 (×8): 5000 [IU] via SUBCUTANEOUS
  Filled 2018-02-10 (×11): qty 1

## 2018-02-10 MED ORDER — NICOTINE 14 MG/24HR TD PT24
14.0000 mg | MEDICATED_PATCH | Freq: Every day | TRANSDERMAL | Status: DC
  Administered 2018-02-10 – 2018-02-14 (×5): 14 mg via TRANSDERMAL
  Filled 2018-02-10 (×5): qty 1

## 2018-02-10 MED ORDER — METOPROLOL SUCCINATE ER 50 MG PO TB24
50.0000 mg | ORAL_TABLET | Freq: Every day | ORAL | Status: DC
  Administered 2018-02-11 – 2018-02-14 (×4): 50 mg via ORAL
  Filled 2018-02-10 (×4): qty 1

## 2018-02-10 MED ORDER — SODIUM CHLORIDE 0.9 % IV SOLN: 250.0000 mL | INTRAVENOUS | Status: DC | PRN

## 2018-02-10 MED ORDER — TRAZODONE HCL 50 MG PO TABS
50.0000 mg | ORAL_TABLET | Freq: Every day | ORAL | Status: DC
  Administered 2018-02-10 – 2018-02-13 (×4): 50 mg via ORAL
  Filled 2018-02-10 (×4): qty 1

## 2018-02-10 MED ORDER — HYDRALAZINE HCL 50 MG PO TABS
100.0000 mg | ORAL_TABLET | Freq: Three times a day (TID) | ORAL | Status: DC
  Administered 2018-02-10 – 2018-02-14 (×11): 100 mg via ORAL
  Filled 2018-02-10 (×8): qty 2
  Filled 2018-02-10: qty 4
  Filled 2018-02-10 (×2): qty 2

## 2018-02-10 MED ORDER — NICARDIPINE HCL IN NACL 20-0.86 MG/200ML-% IV SOLN
3.0000 mg/h | Freq: Once | INTRAVENOUS | Status: AC
  Administered 2018-02-10: 5 mg/h via INTRAVENOUS
  Filled 2018-02-10: qty 200

## 2018-02-10 MED ORDER — CLONIDINE HCL 0.2 MG PO TABS
0.2000 mg | ORAL_TABLET | Freq: Three times a day (TID) | ORAL | Status: DC
  Administered 2018-02-10 – 2018-02-14 (×11): 0.2 mg via ORAL
  Filled 2018-02-10 (×11): qty 1

## 2018-02-10 MED ORDER — AMLODIPINE BESYLATE 10 MG PO TABS
10.0000 mg | ORAL_TABLET | Freq: Every day | ORAL | Status: DC
  Administered 2018-02-11 – 2018-02-14 (×4): 10 mg via ORAL
  Filled 2018-02-10 (×4): qty 1

## 2018-02-10 NOTE — ED Notes (Signed)
Patient transported to CT Will initiate cardene drip when pt returns

## 2018-02-10 NOTE — ED Triage Notes (Signed)
Pt from home with ems for syncopal episode witnessed by family unknown downtime, pt denies hitting his head or falling. Pt c.o severe posterior headache upon waking, along with blurred vision and seeing white spots. Pt denies any of these symptoms at this time. Pt hypertensive 298/160 manually by EMS. Pt states he missed his BP meds yesterday. Other VSS. NAD at this time

## 2018-02-10 NOTE — ED Notes (Signed)
ED Provider at bedside. 

## 2018-02-10 NOTE — ED Notes (Signed)
Ultrasound at bedside

## 2018-02-10 NOTE — H&P (Signed)
PULMONARY / CRITICAL CARE MEDICINE   Name: Stanley [name] MRN: [id number] DOB: 11-09-1975    ADMISSION DATE:  02/10/2018 CONSULTATION DATE:  02/10/2018  REFERRING MD:  Dr. Sherry Ruffing   CHIEF COMPLAINT:  HTN Urgency   HISTORY OF PRESENT ILLNESS:   42 year old male with history of medical non-compliance with PMH of HTN, CKD stage 4, Diastolic Heart Failure with EF 55-60, and Current Tobacco Abuse/Polysubstance Abuse of THC  Presents to ED on 5/11 with syncope and vision changes. BP 255/138. CT and MRI negative for acute event. Placed on Cardene gtt. PCCM asked to admit.   Patient states that he took home BP medications today up until noon, however, staff nurse reports that he told them on arrival that he hasn't taken any since yesterday (5/10) AM. Noted Admissions in January and September of 2018 for uncontrolled HTN.   PAST MEDICAL HISTORY :  He  has a past medical history of Anxiety, CKD (chronic kidney disease), stage IV ([place]), Depression, Dyslexia, High cholesterol, Hypertension, Stroke ([place]) (06/03/2017), and Tobacco abuse.  PAST SURGICAL HISTORY: He  has a past surgical history that includes No past surgeries.  Allergies  Allergen Reactions  . Lactose Intolerance (Gi) Nausea And Vomiting  . Other Swelling    All beans  . Nitroglycerin Anxiety and Other (See Comments)    Pain and feels like he is on fire    No current facility-administered medications on file prior to encounter.    Current Outpatient Medications on File Prior to Encounter  Medication Sig  . acetaminophen (TYLENOL) 500 MG tablet Take 1,000 mg by mouth as needed for mild pain.  Marland Kitchen albuterol (PROVENTIL HFA;VENTOLIN HFA) 108 (90 Base) MCG/ACT inhaler Inhale 2 puffs into the lungs every 4 (four) hours as needed for wheezing or shortness of breath (cough, shortness of breath or wheezing.).  Marland Kitchen amLODipine (NORVASC) 10 MG tablet Take 1 tablet (10 mg total) by mouth daily.  Marland Kitchen aspirin EC 81 MG tablet Take 1 tablet (81 mg  total) by mouth daily as needed for mild pain. (Patient taking differently: Take 81 mg by mouth 2 (two) times daily as needed for mild pain. )  . atorvastatin (LIPITOR) 40 MG tablet Take 1 tablet (40 mg total) by mouth daily at 6 PM.  . cloNIDine (CATAPRES) 0.2 MG tablet Take 1 tablet (0.2 mg total) by mouth 3 (three) times daily.  . furosemide (LASIX) 20 MG tablet Take 1 tablet (20 mg total) by mouth daily as needed.  Marland Kitchen gemfibrozil (LOPID) 600 MG tablet Take 600 mg by mouth 2 (two) times daily before a meal.  . hydrALAZINE (APRESOLINE) 100 MG tablet Take 1 tablet (100 mg total) by mouth 3 (three) times daily.  Marland Kitchen labetalol (NORMODYNE) 200 MG tablet Take 2 tablets (400 mg total) by mouth 3 (three) times daily.  . metoprolol succinate (TOPROL-XL) 50 MG 24 hr tablet Take 50 mg by mouth daily. Take with or immediately following a meal.  . Multiple Vitamin (MULTIVITAMIN WITH MINERALS) TABS tablet Take 1 tablet by mouth daily as needed.  . potassium chloride SA (K-DUR,KLOR-CON) 20 MEQ tablet Take 1 tablet (20 mEq total) by mouth daily.  . traZODone (DESYREL) 50 MG tablet Take 1 tablet (50 mg total) by mouth at bedtime.  . nicotine (NICODERM CQ - DOSED IN MG/24 HOURS) 14 mg/24hr patch Place 1 patch (14 mg total) onto the skin daily. (Patient not taking: Reported on 11/24/2017)    FAMILY HISTORY:  His indicated that his  mother is alive. He indicated that his father is alive. He indicated that the status of his brother is unknown. He indicated that his maternal grandmother is deceased. He indicated that his maternal grandfather is deceased. He indicated that his paternal grandmother is deceased. He indicated that his paternal grandfather is deceased.   SOCIAL HISTORY: He  reports that he has been smoking cigarettes.  He has a 30.00 pack-year smoking history. He has never used smokeless tobacco. He reports that he has current or past drug history. Drug: Marijuana. He reports that he does not drink  alcohol.  REVIEW OF SYSTEMS:   All negative; except for those that are bolded, which indicate positives.  Constitutional: weight loss, weight gain, night sweats, fevers, chills, fatigue, weakness.  HEENT: headaches, sore throat, sneezing, nasal congestion, post nasal drip, difficulty swallowing, tooth/dental problems, visual complaints, visual changes, ear aches. Neuro: difficulty with speech, weakness, numbness, ataxia. CV:  chest pain, orthopnea, PND, swelling in lower extremities, dizziness, palpitations, syncope.  Resp: cough, hemoptysis, dyspnea, wheezing. GI: heartburn, indigestion, abdominal pain, nausea, vomiting, diarrhea, constipation, change in bowel habits, loss of appetite, hematemesis, melena, hematochezia.  GU: dysuria, change in color of urine, urgency or frequency, flank pain, hematuria. Swelling to scrotum  MSK: joint pain or swelling, decreased range of motion. Psych: change in mood or affect, depression, anxiety, suicidal ideations, homicidal ideations. Skin: rash, itching, bruising.   SUBJECTIVE:   VITAL SIGNS: BP (!) 194/96   Pulse 98   Temp 98 F (36.7 C)   Resp (!) 22   Ht 5\' 8"  (1.727 m)   Wt 97.5 kg (215 lb)   SpO2 97%   BMI 32.69 kg/m   HEMODYNAMICS:    VENTILATOR SETTINGS:    INTAKE / OUTPUT: No intake/output data recorded.  PHYSICAL EXAMINATION: General:  Adult male, no distress  Neuro:  Pupils intact, alert, oriented, follows commands  HEENT:  Dry MM  Cardiovascular:  RRR, S4 Murmur Lungs:  Clear breath sounds, no wheeze  Abdomen:  Thin, soft, non-tender  Musculoskeletal:  -edema  Skin:  Swelling to scrotum, skin intact   LABS:  BMET Recent Labs  Lab 02/10/18 1527  NA 140  K 3.6  CL 105  CO2 24  BUN 24*  CREATININE 3.18*  GLUCOSE 88    Electrolytes Recent Labs  Lab 02/10/18 1527  CALCIUM 9.1    CBC Recent Labs  Lab 02/10/18 1527  WBC 5.1  HGB 12.3*  HCT 37.8*  PLT 313    Coag's No results for input(s):  APTT, INR in the last 168 hours.  Sepsis Markers No results for input(s): LATICACIDVEN, PROCALCITON, O2SATVEN in the last 168 hours.  ABG No results for input(s): PHART, PCO2ART, PO2ART in the last 168 hours.  Liver Enzymes No results for input(s): AST, ALT, ALKPHOS, BILITOT, ALBUMIN in the last 168 hours.  Cardiac Enzymes No results for input(s): TROPONINI, PROBNP in the last 168 hours.  Glucose Recent Labs  Lab 02/10/18 1654  GLUCAP 85    Imaging Dg Chest 2 View  Result Date: 02/10/2018 CLINICAL DATA:  Witnessed syncopal episode at home, palpitations, history hypertension, stroke, stage IV chronic kidney disease, smoker EXAM: CHEST - 2 VIEW COMPARISON:  11/15/2016 FINDINGS: Borderline enlargement of cardiac silhouette with slight pulmonary vascular congestion. Mediastinal contours and pulmonary vascularity normal. Lungs clear. No pleural effusion or pneumothorax. Bones unremarkable. IMPRESSION: No acute abnormalities. Borderline enlargement of cardiac silhouette with slight pulmonary vascular congestion Electronically Signed   By: Crist Infante.D.  On: 02/10/2018 16:06   Ct Head Wo Contrast  Result Date: 02/10/2018 CLINICAL DATA:  42 year old male with severe headache and syncope today. EXAM: CT HEAD WITHOUT CONTRAST TECHNIQUE: Contiguous axial images were obtained from the base of the skull through the vertex without intravenous contrast. COMPARISON:  01/16/2018 CT and prior studies FINDINGS: Brain: No evidence of acute infarction, hemorrhage, hydrocephalus, extra-axial collection or mass lesion/mass effect. Vascular: No hyperdense vessel or unexpected calcification. Skull: Normal. Negative for fracture or focal lesion. Sinuses/Orbits: No acute finding. Other: None. IMPRESSION: Unremarkable noncontrast head CT. Electronically Signed   By: Margarette Canada M.D.   On: 02/10/2018 17:12   Mr Angiogram Head Wo Contrast  Result Date: 02/10/2018 CLINICAL DATA:  42 year old male with severe  headache and witnessed syncope. Blurred vision. EXAM: MRI HEAD WITHOUT CONTRAST MRA HEAD WITHOUT CONTRAST MRA NECK WITHOUT CONTRAST TECHNIQUE: Multiplanar, multiecho pulse sequences of the brain and surrounding structures were obtained without intravenous contrast. Angiographic images of the Circle of Willis were obtained using MRA technique without intravenous contrast. Angiographic images of the neck were obtained using MRA technique without intravenous contrast. Carotid stenosis measurements (when applicable) are obtained utilizing NASCET criteria, using the distal internal carotid diameter as the denominator. COMPARISON:  Head CT without contrast 1635 hours today, and earlier. Prior brain MRI and intracranial MRA 06/12/2017. FINDINGS: MRI HEAD FINDINGS Brain: No restricted diffusion to suggest acute infarction. No midline shift, mass effect, evidence of mass lesion, ventriculomegaly, extra-axial collection or acute intracranial hemorrhage. Cervicomedullary junction and pituitary are within normal limits. Widely scattered small foci of mostly subcortical cerebral white matter T2 and FLAIR hyperintensity. The extent is moderate to severe for age. The configuration is nonspecific. No associated cortical encephalomalacia or chronic cerebral blood products. However, there is evidence of chronic microhemorrhage in the right pons and several pontine T2 hyperintense foci which resembles small chronic lacune is a (series 14001, image 9). The cerebellum and deep gray matter nuclei appear normal. Vascular: Major intracranial vascular flow voids are stable. Skull and upper cervical spine: Negative visible cervical spine. Normal bone marrow signal. Sinuses/Orbits: Normal orbits soft tissues. Paranasal sinuses are well pneumatized today. Other: The mastoid air cells are clear. Visible internal auditory structures appear normal. Scalp and face soft tissues appear negative. MRA NECK FINDINGS Noncontrast time-of-flight MRA  imaging demonstrates antegrade flow in the bilateral carotid and vertebral arteries throughout the neck and to the skull base. The carotid bifurcations and proximal ICAs have a normal time-of-flight appearance. The left vertebral artery is slightly dominant. No arterial stenosis identified in the neck. MRA HEAD FINDINGS Antegrade flow in the posterior circulation with mildly dominant distal left vertebral artery. Patent PICA origins. No distal vertebral stenosis. Patent basilar artery and AICA origins. There is mild irregularity in the distal basilar without significant stenosis. The SCA and PCA origins appear normal. There is moderate irregularity and stenosis in the right PCA P1 segment (series [id number], image 5). The remaining bilateral PCA branches appear normal. Posterior communicating arteries are diminutive or absent. Antegrade flow in both ICA siphons. Suggestion of vessel irregularity at both carotid anterior genu, but no definite siphon stenosis. Normal ophthalmic artery origins. Patent carotid termini. The left ACA A1 segment is dominant and the right is diminutive. The left A1 appears mildly irregular. The anterior communicating artery is normal. The right ACA A2 segment is mildly to moderately irregular in 2 different places, with moderate distal A2 stenosis (series [id number], image 2). The bilateral MCA M1 segments are patent and mildly tortuous  without irregularity or stenosis. Both MCA bifurcations are patent. There is mild irregularity in the right MCA posterior most M2 branch. No convincing left MCA branch irregularity. IMPRESSION: 1. No acute intracranial abnormality, but advanced signal abnormality in the subcortical cerebral white matter. These white matter changes are nonspecific with top differential considerations of accelerated/hereditary small vessel ischemia and sequelae of vasculitis or hypercoagulable state. 2. Abnormal signal also in the pons and this most resembles chronic small vessel  ischemia, including presence of a chronic micro-hemorrhage. 3. Negative noncontrast neck MRA, but multifocal large and medium size vessel irregularity on the intracranial MRA. This includes the basilar tip, right PCA P1, right ACA A2, and right MCA M2. This appearance somewhat favors advanced for age atherosclerosis over intracranial vasculitis. Electronically Signed   By: Genevie Ann M.D.   On: 02/10/2018 19:37   Mr Jodene Nam Neck Wo Contrast  Result Date: 02/10/2018 CLINICAL DATA:  42 year old male with severe headache and witnessed syncope. Blurred vision. EXAM: MRI HEAD WITHOUT CONTRAST MRA HEAD WITHOUT CONTRAST MRA NECK WITHOUT CONTRAST TECHNIQUE: Multiplanar, multiecho pulse sequences of the brain and surrounding structures were obtained without intravenous contrast. Angiographic images of the Circle of Willis were obtained using MRA technique without intravenous contrast. Angiographic images of the neck were obtained using MRA technique without intravenous contrast. Carotid stenosis measurements (when applicable) are obtained utilizing NASCET criteria, using the distal internal carotid diameter as the denominator. COMPARISON:  Head CT without contrast 1635 hours today, and earlier. Prior brain MRI and intracranial MRA 06/12/2017. FINDINGS: MRI HEAD FINDINGS Brain: No restricted diffusion to suggest acute infarction. No midline shift, mass effect, evidence of mass lesion, ventriculomegaly, extra-axial collection or acute intracranial hemorrhage. Cervicomedullary junction and pituitary are within normal limits. Widely scattered small foci of mostly subcortical cerebral white matter T2 and FLAIR hyperintensity. The extent is moderate to severe for age. The configuration is nonspecific. No associated cortical encephalomalacia or chronic cerebral blood products. However, there is evidence of chronic microhemorrhage in the right pons and several pontine T2 hyperintense foci which resembles small chronic lacune is a  (series 14001, image 9). The cerebellum and deep gray matter nuclei appear normal. Vascular: Major intracranial vascular flow voids are stable. Skull and upper cervical spine: Negative visible cervical spine. Normal bone marrow signal. Sinuses/Orbits: Normal orbits soft tissues. Paranasal sinuses are well pneumatized today. Other: The mastoid air cells are clear. Visible internal auditory structures appear normal. Scalp and face soft tissues appear negative. MRA NECK FINDINGS Noncontrast time-of-flight MRA imaging demonstrates antegrade flow in the bilateral carotid and vertebral arteries throughout the neck and to the skull base. The carotid bifurcations and proximal ICAs have a normal time-of-flight appearance. The left vertebral artery is slightly dominant. No arterial stenosis identified in the neck. MRA HEAD FINDINGS Antegrade flow in the posterior circulation with mildly dominant distal left vertebral artery. Patent PICA origins. No distal vertebral stenosis. Patent basilar artery and AICA origins. There is mild irregularity in the distal basilar without significant stenosis. The SCA and PCA origins appear normal. There is moderate irregularity and stenosis in the right PCA P1 segment (series [id number], image 5). The remaining bilateral PCA branches appear normal. Posterior communicating arteries are diminutive or absent. Antegrade flow in both ICA siphons. Suggestion of vessel irregularity at both carotid anterior genu, but no definite siphon stenosis. Normal ophthalmic artery origins. Patent carotid termini. The left ACA A1 segment is dominant and the right is diminutive. The left A1 appears mildly irregular. The anterior communicating artery is  normal. The right ACA A2 segment is mildly to moderately irregular in 2 different places, with moderate distal A2 stenosis (series [id number], image 2). The bilateral MCA M1 segments are patent and mildly tortuous without irregularity or stenosis. Both MCA bifurcations  are patent. There is mild irregularity in the right MCA posterior most M2 branch. No convincing left MCA branch irregularity. IMPRESSION: 1. No acute intracranial abnormality, but advanced signal abnormality in the subcortical cerebral white matter. These white matter changes are nonspecific with top differential considerations of accelerated/hereditary small vessel ischemia and sequelae of vasculitis or hypercoagulable state. 2. Abnormal signal also in the pons and this most resembles chronic small vessel ischemia, including presence of a chronic micro-hemorrhage. 3. Negative noncontrast neck MRA, but multifocal large and medium size vessel irregularity on the intracranial MRA. This includes the basilar tip, right PCA P1, right ACA A2, and right MCA M2. This appearance somewhat favors advanced for age atherosclerosis over intracranial vasculitis. Electronically Signed   By: Genevie Ann M.D.   On: 02/10/2018 19:37   Mr Brain Wo Contrast  Result Date: 02/10/2018 CLINICAL DATA:  42 year old male with severe headache and witnessed syncope. Blurred vision. EXAM: MRI HEAD WITHOUT CONTRAST MRA HEAD WITHOUT CONTRAST MRA NECK WITHOUT CONTRAST TECHNIQUE: Multiplanar, multiecho pulse sequences of the brain and surrounding structures were obtained without intravenous contrast. Angiographic images of the Circle of Willis were obtained using MRA technique without intravenous contrast. Angiographic images of the neck were obtained using MRA technique without intravenous contrast. Carotid stenosis measurements (when applicable) are obtained utilizing NASCET criteria, using the distal internal carotid diameter as the denominator. COMPARISON:  Head CT without contrast 1635 hours today, and earlier. Prior brain MRI and intracranial MRA 06/12/2017. FINDINGS: MRI HEAD FINDINGS Brain: No restricted diffusion to suggest acute infarction. No midline shift, mass effect, evidence of mass lesion, ventriculomegaly, extra-axial collection or  acute intracranial hemorrhage. Cervicomedullary junction and pituitary are within normal limits. Widely scattered small foci of mostly subcortical cerebral white matter T2 and FLAIR hyperintensity. The extent is moderate to severe for age. The configuration is nonspecific. No associated cortical encephalomalacia or chronic cerebral blood products. However, there is evidence of chronic microhemorrhage in the right pons and several pontine T2 hyperintense foci which resembles small chronic lacune is a (series 14001, image 9). The cerebellum and deep gray matter nuclei appear normal. Vascular: Major intracranial vascular flow voids are stable. Skull and upper cervical spine: Negative visible cervical spine. Normal bone marrow signal. Sinuses/Orbits: Normal orbits soft tissues. Paranasal sinuses are well pneumatized today. Other: The mastoid air cells are clear. Visible internal auditory structures appear normal. Scalp and face soft tissues appear negative. MRA NECK FINDINGS Noncontrast time-of-flight MRA imaging demonstrates antegrade flow in the bilateral carotid and vertebral arteries throughout the neck and to the skull base. The carotid bifurcations and proximal ICAs have a normal time-of-flight appearance. The left vertebral artery is slightly dominant. No arterial stenosis identified in the neck. MRA HEAD FINDINGS Antegrade flow in the posterior circulation with mildly dominant distal left vertebral artery. Patent PICA origins. No distal vertebral stenosis. Patent basilar artery and AICA origins. There is mild irregularity in the distal basilar without significant stenosis. The SCA and PCA origins appear normal. There is moderate irregularity and stenosis in the right PCA P1 segment (series [id number], image 5). The remaining bilateral PCA branches appear normal. Posterior communicating arteries are diminutive or absent. Antegrade flow in both ICA siphons. Suggestion of vessel irregularity at both carotid anterior  genu, but  no definite siphon stenosis. Normal ophthalmic artery origins. Patent carotid termini. The left ACA A1 segment is dominant and the right is diminutive. The left A1 appears mildly irregular. The anterior communicating artery is normal. The right ACA A2 segment is mildly to moderately irregular in 2 different places, with moderate distal A2 stenosis (series [id number], image 2). The bilateral MCA M1 segments are patent and mildly tortuous without irregularity or stenosis. Both MCA bifurcations are patent. There is mild irregularity in the right MCA posterior most M2 branch. No convincing left MCA branch irregularity. IMPRESSION: 1. No acute intracranial abnormality, but advanced signal abnormality in the subcortical cerebral white matter. These white matter changes are nonspecific with top differential considerations of accelerated/hereditary small vessel ischemia and sequelae of vasculitis or hypercoagulable state. 2. Abnormal signal also in the pons and this most resembles chronic small vessel ischemia, including presence of a chronic micro-hemorrhage. 3. Negative noncontrast neck MRA, but multifocal large and medium size vessel irregularity on the intracranial MRA. This includes the basilar tip, right PCA P1, right ACA A2, and right MCA M2. This appearance somewhat favors advanced for age atherosclerosis over intracranial vasculitis. Electronically Signed   By: Genevie Ann M.D.   On: 02/10/2018 19:37     STUDIES:  CXR 5/11 > No acute abnormalities. Borderline enlargement of cardiac silhouette with slight pulmonary vascular congestion CT Head 5/11 > Unremarkable noncontrast head CT MR Brain/MRA Head/Neck 5/11 > 1. No acute intracranial abnormality, but advanced signal abnormality in the subcortical cerebral white matter. These white matter changes are nonspecific with top differential considerations of accelerated/hereditary small vessel ischemia and sequelae of vasculitis or hypercoagulable state.  Abnormal signal also in the pons and this most resembles chronic small vessel ischemia, including presence of a chronic micro-hemorrhage. Negative noncontrast neck MRA, but multifocal large and medium size vessel irregularity on the intracranial MRA. This includes the basilar tip, right PCA P1, right ACA A2, and right MCA M2. This appearance somewhat favors advanced for age atherosclerosis over intracranial vasculitis.  CULTURES: None.   ANTIBIOTICS: None.   SIGNIFICANT EVENTS: 5/11 > Presents to ED   LINES/TUBES: PIV   DISCUSSION: 42 year old male with history of medical non-compliance presents to ED with syncope and vision changes. BP 255/138. Placed on Cardene gtt. PCCM asked to admit.   ASSESSMENT / PLAN:  HTN Urgency with +vision changes  (CT and MRI negative for acute, chronic changes noted)  H/O HTN (with medical non-compliance), Diastolic Heart Failure (EF 55-60) Plan  -Cardiac Monitoring  -Wean Cardene for systolic goal of 400 -Restart Home HTN medications  -ECHO pending  -Trend Troponin  -BNP pending   Chronic Kidney Stage 4 (Baseline Crt 3.8)  Swelling to Testicles (Patient states has been ongoing since December)  Plan  -Trend BMP -Avoid Nephrotoxic Agents  -U/S/Doppler Scrotum   Tobacco Abuse  H/O Polysubstance Abuse (Reports THC for Sleep)  Plan  -Smoking Cessation  -Nicotine Patch   VTE  Plan -SQ Heparin    FAMILY  - Updates: No family present  - Inter-disciplinary family meet or Palliative Care meeting due by: 02/17/2018   Hayden Pedro, AGACNP-BC [facility]  Pgr: [phone]  PCCM Pgr: [phone]

## 2018-02-10 NOTE — ED Notes (Signed)
CCM at bedside 

## 2018-02-10 NOTE — ED Notes (Signed)
Main lab to add on BNP 

## 2018-02-10 NOTE — ED Provider Notes (Signed)
[facility] DEPART[facility] Provider Note   CSN: [id number] Arrival date & time: 02/10/18  1511     History   Chief Complaint Chief Complaint  Patient presents with  . Loss of Consciousness  . Hypertension    HPI Stanley [name] is a 42 y.o. male.  The history is provided by the patient and medical records. No language interpreter was used.  Loss of Consciousness   This is a new problem. The current episode started less than 1 hour ago. The problem occurs rarely. The problem has been resolved. Length of episode of loss of consciousness: unclear. Associated symptoms include headaches, palpitations and visual change. Pertinent negatives include abdominal pain, back pain, bowel incontinence, chest pain, clumsiness, congestion, diaphoresis, dizziness, fever, light-headedness, malaise/fatigue, nausea, slurred speech, vertigo, vomiting and weakness. He has tried nothing for the symptoms. The treatment provided no relief.  Headache   This is a recurrent problem. The current episode started 1 to 2 hours ago. The problem occurs constantly. The problem has been resolved. The pain is located in the left unilateral and occipital region. The quality of the pain is described as dull. The pain is at a severity of 10/10. The pain is severe. The pain radiates to the left neck. Associated symptoms include palpitations and syncope. Pertinent negatives include no fever, no malaise/fatigue, no chest pressure, no shortness of breath, no nausea and no vomiting. He has tried nothing for the symptoms. The treatment provided no relief.    Past Medical History:  Diagnosis Date  . Anxiety   . CKD (chronic kidney disease), stage IV ([place])   . Depression   . Dyslexia   . High cholesterol   . Hypertension   . Stroke ([place]) 06/03/2017   "minor; completely recovered today" (06/13/2017)  . Tobacco abuse     Patient Active Problem List   Diagnosis Date Noted  . Hypertensive urgency 06/12/2017    . CKD stage 4 secondary to hypertension ([place]) 03/01/2017  . Anemia, chronic disease 03/01/2017  . Hyperparathyroidism, secondary renal ([place]) 03/01/2017  . Acute dyspnea   . Tobacco abuse 09/26/2016  . Elevated troponin 09/26/2016  . Hypertension 09/26/2016  . Hypokalemia 09/26/2016  . AKI (acute kidney injury) ([place]) 09/26/2016    Past Surgical History:  Procedure Laterality Date  . NO PAST SURGERIES          Home Medications    Prior to Admission medications   Medication Sig Start Date End Date Taking? Authorizing Provider  acetaminophen (TYLENOL) 500 MG tablet Take 1,000 mg by mouth as needed for mild pain.    [provider]  albuterol (PROVENTIL HFA;VENTOLIN HFA) 108 (90 Base) MCG/ACT inhaler Inhale 2 puffs into the lungs every 4 (four) hours as needed for wheezing or shortness of breath (cough, shortness of breath or wheezing.). 11/24/17   Scot Jun, FNP  amLODipine (NORVASC) 10 MG tablet Take 1 tablet (10 mg total) by mouth daily. 11/24/17   Scot Jun, FNP  aspirin EC 81 MG tablet Take 1 tablet (81 mg total) by mouth daily as needed for mild pain. 11/24/17   Scot Jun, FNP  atorvastatin (LIPITOR) 40 MG tablet Take 1 tablet (40 mg total) by mouth daily at 6 PM. 06/16/17   Hosie Poisson, MD  cloNIDine (CATAPRES) 0.2 MG tablet Take 1 tablet (0.2 mg total) by mouth 3 (three) times daily. 11/24/17   Scot Jun, FNP  furosemide (LASIX) 20 MG tablet Take 1 tablet (20  mg total) by mouth daily as needed. 11/24/17   Scot Jun, FNP  gemfibrozil (LOPID) 600 MG tablet Take 600 mg by mouth 2 (two) times daily before a meal.    [provider]  hydrALAZINE (APRESOLINE) 100 MG tablet Take 1 tablet (100 mg total) by mouth 3 (three) times daily. 11/24/17   Scot Jun, FNP  labetalol (NORMODYNE) 200 MG tablet Take 2 tablets (400 mg total) by mouth 3 (three) times daily. Patient not taking: Reported on 01/16/2018 06/16/17   Hosie Poisson, MD  metoprolol succinate (TOPROL-XL) 50 MG 24 hr tablet Take 50 mg by mouth daily. Take with or immediately following a meal.    [provider]  Multiple Vitamin (MULTIVITAMIN WITH MINERALS) TABS tablet Take 1 tablet by mouth daily as needed.    [provider]  nicotine (NICODERM CQ - DOSED IN MG/24 HOURS) 14 mg/24hr patch Place 1 patch (14 mg total) onto the skin daily. Patient not taking: Reported on 11/24/2017 06/16/17   Hosie Poisson, MD  potassium chloride SA (K-DUR,KLOR-CON) 20 MEQ tablet Take 1 tablet (20 mEq total) by mouth daily. 11/24/17   Scot Jun, FNP  traZODone (DESYREL) 50 MG tablet Take 1 tablet (50 mg total) by mouth at bedtime. 11/24/17   Scot Jun, FNP    Family History Family History  Problem Relation Age of Onset  . Hypertension Mother   . Hypertension Brother   . Diabetes type II Brother     Social History Social History   Tobacco Use  . Smoking status: Current Every Day Smoker    Packs/day: 1.00    Years: 30.00    Pack years: 30.00    Types: Cigarettes  . Smokeless tobacco: Never Used  Substance Use Topics  . Alcohol use: No  . Drug use: Yes    Types: Marijuana    Comment: 06/13/2017 "not often"     Allergies   Lactose intolerance (gi); Other; and Nitroglycerin   Review of Systems Review of Systems  Constitutional: Negative for chills, diaphoresis, fatigue, fever and malaise/fatigue.  HENT: Negative for congestion.   Eyes: Positive for visual disturbance.  Respiratory: Negative for chest tightness, shortness of breath, wheezing and stridor.   Cardiovascular: Positive for palpitations and syncope. Negative for chest pain.  Gastrointestinal: Negative for abdominal pain, bowel incontinence, constipation, diarrhea, nausea and vomiting.  Genitourinary: Negative for flank pain and frequency.  Musculoskeletal: Positive for neck pain. Negative for back pain and neck stiffness.  Skin: Negative for rash and wound.    Neurological: Positive for syncope and headaches. Negative for dizziness, vertigo, facial asymmetry, weakness, light-headedness and numbness.  Psychiatric/Behavioral: Negative for agitation.  All other systems reviewed and are negative.    Physical Exam Updated Vital Signs BP (!) 255/138 (BP Location: Left Arm)   Pulse 64   Temp 98.3 F (36.8 C) (Oral)   Resp 17   Ht 5\' 8"  (1.727 m)   Wt 97.5 kg (215 lb)   SpO2 100%   BMI 32.69 kg/m   Physical Exam  Constitutional: He is oriented to person, place, and time. He appears well-developed and well-nourished. No distress.  HENT:  Head: Normocephalic and atraumatic.  Nose: Nose normal.  Mouth/Throat: Oropharynx is clear and moist. No oropharyngeal exudate.  Eyes: Pupils are equal, round, and reactive to light. Conjunctivae and EOM are normal.  Neck: Normal range of motion. Neck supple. Muscular tenderness present.    Cardiovascular: Normal rate.  No murmur heard. Pulmonary/Chest:  Effort normal. No respiratory distress. He has no wheezes. He has no rales. He exhibits no tenderness.  Abdominal: Bowel sounds are normal. He exhibits no distension. There is no tenderness.  Musculoskeletal: He exhibits no edema or tenderness.  Neurological: He is alert and oriented to person, place, and time. He is not disoriented. He displays no tremor. No cranial nerve deficit or sensory deficit. He exhibits normal muscle tone. Coordination normal. GCS eye subscore is 4. GCS verbal subscore is 5. GCS motor subscore is 6.  Skin: Capillary refill takes less than 2 seconds. He is not diaphoretic. No erythema. No pallor.  Psychiatric: He has a normal mood and affect.  Nursing note and vitals reviewed.    ED Treatments / Results  Labs (all labs ordered are listed, but only abnormal results are displayed) Labs Reviewed  BASIC METABOLIC PANEL - Abnormal; Notable for the following components:      Result Value   BUN 24 (*)    Creatinine, Ser 3.18 (*)     GFR calc non Af Amer 23 (*)    GFR calc Af Amer 26 (*)    All other components within normal limits  CBC - Abnormal; Notable for the following components:   Hemoglobin 12.3 (*)    HCT 37.8 (*)    All other components within normal limits  URINALYSIS, ROUTINE W REFLEX MICROSCOPIC - Abnormal; Notable for the following components:   Color, Urine STRAW (*)    Protein, ur 100 (*)    All other components within normal limits  RAPID URINE DRUG SCREEN, HOSP PERFORMED - Abnormal; Notable for the following components:   Tetrahydrocannabinol POSITIVE (*)    All other components within normal limits  BRAIN NATRIURETIC PEPTIDE - Abnormal; Notable for the following components:   B Natriuretic Peptide 585.3 (*)    All other components within normal limits  MRSA PCR SCREENING  URINE CULTURE  MAGNESIUM  BASIC METABOLIC PANEL  CBC  MAGNESIUM  PHOSPHORUS  TROPONIN I  TROPONIN I  TROPONIN I  CBG MONITORING, ED  I-STAT TROPONIN, ED    EKG EKG Interpretation  Date/Time:  Saturday Feb 10 2018 15:17:00 EDT Ventricular Rate:  59 PR Interval:    QRS Duration: 99 QT Interval:  481 QTC Calculation: 477 R Axis:   44 Text Interpretation:  Sinus rhythm Probable left atrial enlargement Left ventricular hypertrophy Abnrm T, consider ischemia, anterolateral lds When compared to prior, similar St and t wave abnormalities.  No STEMI Confirmed by Antony Blackbird [phone]) on 02/10/2018 3:21:42 PM   Radiology Dg Chest 2 View  Result Date: 02/10/2018 CLINICAL DATA:  Witnessed syncopal episode at home, palpitations, history hypertension, stroke, stage IV chronic kidney disease, smoker EXAM: CHEST - 2 VIEW COMPARISON:  11/15/2016 FINDINGS: Borderline enlargement of cardiac silhouette with slight pulmonary vascular congestion. Mediastinal contours and pulmonary vascularity normal. Lungs clear. No pleural effusion or pneumothorax. Bones unremarkable. IMPRESSION: No acute abnormalities. Borderline enlargement of  cardiac silhouette with slight pulmonary vascular congestion Electronically Signed   By: Lavonia Dana M.D.   On: 02/10/2018 16:06   Ct Head Wo Contrast  Result Date: 02/10/2018 CLINICAL DATA:  42 year old male with severe headache and syncope today. EXAM: CT HEAD WITHOUT CONTRAST TECHNIQUE: Contiguous axial images were obtained from the base of the skull through the vertex without intravenous contrast. COMPARISON:  01/16/2018 CT and prior studies FINDINGS: Brain: No evidence of acute infarction, hemorrhage, hydrocephalus, extra-axial collection or mass lesion/mass effect. Vascular: No hyperdense vessel or unexpected calcification.  Skull: Normal. Negative for fracture or focal lesion. Sinuses/Orbits: No acute finding. Other: None. IMPRESSION: Unremarkable noncontrast head CT. Electronically Signed   By: Margarette Canada M.D.   On: 02/10/2018 17:12   Mr Angiogram Head Wo Contrast  Result Date: 02/10/2018 CLINICAL DATA:  43 year old male with severe headache and witnessed syncope. Blurred vision. EXAM: MRI HEAD WITHOUT CONTRAST MRA HEAD WITHOUT CONTRAST MRA NECK WITHOUT CONTRAST TECHNIQUE: Multiplanar, multiecho pulse sequences of the brain and surrounding structures were obtained without intravenous contrast. Angiographic images of the Circle of Willis were obtained using MRA technique without intravenous contrast. Angiographic images of the neck were obtained using MRA technique without intravenous contrast. Carotid stenosis measurements (when applicable) are obtained utilizing NASCET criteria, using the distal internal carotid diameter as the denominator. COMPARISON:  Head CT without contrast 1635 hours today, and earlier. Prior brain MRI and intracranial MRA 06/12/2017. FINDINGS: MRI HEAD FINDINGS Brain: No restricted diffusion to suggest acute infarction. No midline shift, mass effect, evidence of mass lesion, ventriculomegaly, extra-axial collection or acute intracranial hemorrhage. Cervicomedullary junction  and pituitary are within normal limits. Widely scattered small foci of mostly subcortical cerebral white matter T2 and FLAIR hyperintensity. The extent is moderate to severe for age. The configuration is nonspecific. No associated cortical encephalomalacia or chronic cerebral blood products. However, there is evidence of chronic microhemorrhage in the right pons and several pontine T2 hyperintense foci which resembles small chronic lacune is a (series 14001, image 9). The cerebellum and deep gray matter nuclei appear normal. Vascular: Major intracranial vascular flow voids are stable. Skull and upper cervical spine: Negative visible cervical spine. Normal bone marrow signal. Sinuses/Orbits: Normal orbits soft tissues. Paranasal sinuses are well pneumatized today. Other: The mastoid air cells are clear. Visible internal auditory structures appear normal. Scalp and face soft tissues appear negative. MRA NECK FINDINGS Noncontrast time-of-flight MRA imaging demonstrates antegrade flow in the bilateral carotid and vertebral arteries throughout the neck and to the skull base. The carotid bifurcations and proximal ICAs have a normal time-of-flight appearance. The left vertebral artery is slightly dominant. No arterial stenosis identified in the neck. MRA HEAD FINDINGS Antegrade flow in the posterior circulation with mildly dominant distal left vertebral artery. Patent PICA origins. No distal vertebral stenosis. Patent basilar artery and AICA origins. There is mild irregularity in the distal basilar without significant stenosis. The SCA and PCA origins appear normal. There is moderate irregularity and stenosis in the right PCA P1 segment (series [id number], image 5). The remaining bilateral PCA branches appear normal. Posterior communicating arteries are diminutive or absent. Antegrade flow in both ICA siphons. Suggestion of vessel irregularity at both carotid anterior genu, but no definite siphon stenosis. Normal ophthalmic  artery origins. Patent carotid termini. The left ACA A1 segment is dominant and the right is diminutive. The left A1 appears mildly irregular. The anterior communicating artery is normal. The right ACA A2 segment is mildly to moderately irregular in 2 different places, with moderate distal A2 stenosis (series [id number], image 2). The bilateral MCA M1 segments are patent and mildly tortuous without irregularity or stenosis. Both MCA bifurcations are patent. There is mild irregularity in the right MCA posterior most M2 branch. No convincing left MCA branch irregularity. IMPRESSION: 1. No acute intracranial abnormality, but advanced signal abnormality in the subcortical cerebral white matter. These white matter changes are nonspecific with top differential considerations of accelerated/hereditary small vessel ischemia and sequelae of vasculitis or hypercoagulable state. 2. Abnormal signal also in the pons and this most resembles  chronic small vessel ischemia, including presence of a chronic micro-hemorrhage. 3. Negative noncontrast neck MRA, but multifocal large and medium size vessel irregularity on the intracranial MRA. This includes the basilar tip, right PCA P1, right ACA A2, and right MCA M2. This appearance somewhat favors advanced for age atherosclerosis over intracranial vasculitis. Electronically Signed   By: Genevie Ann M.D.   On: 02/10/2018 19:37   Mr Jodene Nam Neck Wo Contrast  Result Date: 02/10/2018 CLINICAL DATA:  42 year old male with severe headache and witnessed syncope. Blurred vision. EXAM: MRI HEAD WITHOUT CONTRAST MRA HEAD WITHOUT CONTRAST MRA NECK WITHOUT CONTRAST TECHNIQUE: Multiplanar, multiecho pulse sequences of the brain and surrounding structures were obtained without intravenous contrast. Angiographic images of the Circle of Willis were obtained using MRA technique without intravenous contrast. Angiographic images of the neck were obtained using MRA technique without intravenous contrast. Carotid  stenosis measurements (when applicable) are obtained utilizing NASCET criteria, using the distal internal carotid diameter as the denominator. COMPARISON:  Head CT without contrast 1635 hours today, and earlier. Prior brain MRI and intracranial MRA 06/12/2017. FINDINGS: MRI HEAD FINDINGS Brain: No restricted diffusion to suggest acute infarction. No midline shift, mass effect, evidence of mass lesion, ventriculomegaly, extra-axial collection or acute intracranial hemorrhage. Cervicomedullary junction and pituitary are within normal limits. Widely scattered small foci of mostly subcortical cerebral white matter T2 and FLAIR hyperintensity. The extent is moderate to severe for age. The configuration is nonspecific. No associated cortical encephalomalacia or chronic cerebral blood products. However, there is evidence of chronic microhemorrhage in the right pons and several pontine T2 hyperintense foci which resembles small chronic lacune is a (series 14001, image 9). The cerebellum and deep gray matter nuclei appear normal. Vascular: Major intracranial vascular flow voids are stable. Skull and upper cervical spine: Negative visible cervical spine. Normal bone marrow signal. Sinuses/Orbits: Normal orbits soft tissues. Paranasal sinuses are well pneumatized today. Other: The mastoid air cells are clear. Visible internal auditory structures appear normal. Scalp and face soft tissues appear negative. MRA NECK FINDINGS Noncontrast time-of-flight MRA imaging demonstrates antegrade flow in the bilateral carotid and vertebral arteries throughout the neck and to the skull base. The carotid bifurcations and proximal ICAs have a normal time-of-flight appearance. The left vertebral artery is slightly dominant. No arterial stenosis identified in the neck. MRA HEAD FINDINGS Antegrade flow in the posterior circulation with mildly dominant distal left vertebral artery. Patent PICA origins. No distal vertebral stenosis. Patent basilar  artery and AICA origins. There is mild irregularity in the distal basilar without significant stenosis. The SCA and PCA origins appear normal. There is moderate irregularity and stenosis in the right PCA P1 segment (series [id number], image 5). The remaining bilateral PCA branches appear normal. Posterior communicating arteries are diminutive or absent. Antegrade flow in both ICA siphons. Suggestion of vessel irregularity at both carotid anterior genu, but no definite siphon stenosis. Normal ophthalmic artery origins. Patent carotid termini. The left ACA A1 segment is dominant and the right is diminutive. The left A1 appears mildly irregular. The anterior communicating artery is normal. The right ACA A2 segment is mildly to moderately irregular in 2 different places, with moderate distal A2 stenosis (series [id number], image 2). The bilateral MCA M1 segments are patent and mildly tortuous without irregularity or stenosis. Both MCA bifurcations are patent. There is mild irregularity in the right MCA posterior most M2 branch. No convincing left MCA branch irregularity. IMPRESSION: 1. No acute intracranial abnormality, but advanced signal abnormality in the subcortical cerebral white  matter. These white matter changes are nonspecific with top differential considerations of accelerated/hereditary small vessel ischemia and sequelae of vasculitis or hypercoagulable state. 2. Abnormal signal also in the pons and this most resembles chronic small vessel ischemia, including presence of a chronic micro-hemorrhage. 3. Negative noncontrast neck MRA, but multifocal large and medium size vessel irregularity on the intracranial MRA. This includes the basilar tip, right PCA P1, right ACA A2, and right MCA M2. This appearance somewhat favors advanced for age atherosclerosis over intracranial vasculitis. Electronically Signed   By: Genevie Ann M.D.   On: 02/10/2018 19:37   Mr Brain Wo Contrast  Result Date: 02/10/2018 CLINICAL DATA:   42 year old male with severe headache and witnessed syncope. Blurred vision. EXAM: MRI HEAD WITHOUT CONTRAST MRA HEAD WITHOUT CONTRAST MRA NECK WITHOUT CONTRAST TECHNIQUE: Multiplanar, multiecho pulse sequences of the brain and surrounding structures were obtained without intravenous contrast. Angiographic images of the Circle of Willis were obtained using MRA technique without intravenous contrast. Angiographic images of the neck were obtained using MRA technique without intravenous contrast. Carotid stenosis measurements (when applicable) are obtained utilizing NASCET criteria, using the distal internal carotid diameter as the denominator. COMPARISON:  Head CT without contrast 1635 hours today, and earlier. Prior brain MRI and intracranial MRA 06/12/2017. FINDINGS: MRI HEAD FINDINGS Brain: No restricted diffusion to suggest acute infarction. No midline shift, mass effect, evidence of mass lesion, ventriculomegaly, extra-axial collection or acute intracranial hemorrhage. Cervicomedullary junction and pituitary are within normal limits. Widely scattered small foci of mostly subcortical cerebral white matter T2 and FLAIR hyperintensity. The extent is moderate to severe for age. The configuration is nonspecific. No associated cortical encephalomalacia or chronic cerebral blood products. However, there is evidence of chronic microhemorrhage in the right pons and several pontine T2 hyperintense foci which resembles small chronic lacune is a (series 14001, image 9). The cerebellum and deep gray matter nuclei appear normal. Vascular: Major intracranial vascular flow voids are stable. Skull and upper cervical spine: Negative visible cervical spine. Normal bone marrow signal. Sinuses/Orbits: Normal orbits soft tissues. Paranasal sinuses are well pneumatized today. Other: The mastoid air cells are clear. Visible internal auditory structures appear normal. Scalp and face soft tissues appear negative. MRA NECK FINDINGS  Noncontrast time-of-flight MRA imaging demonstrates antegrade flow in the bilateral carotid and vertebral arteries throughout the neck and to the skull base. The carotid bifurcations and proximal ICAs have a normal time-of-flight appearance. The left vertebral artery is slightly dominant. No arterial stenosis identified in the neck. MRA HEAD FINDINGS Antegrade flow in the posterior circulation with mildly dominant distal left vertebral artery. Patent PICA origins. No distal vertebral stenosis. Patent basilar artery and AICA origins. There is mild irregularity in the distal basilar without significant stenosis. The SCA and PCA origins appear normal. There is moderate irregularity and stenosis in the right PCA P1 segment (series [id number], image 5). The remaining bilateral PCA branches appear normal. Posterior communicating arteries are diminutive or absent. Antegrade flow in both ICA siphons. Suggestion of vessel irregularity at both carotid anterior genu, but no definite siphon stenosis. Normal ophthalmic artery origins. Patent carotid termini. The left ACA A1 segment is dominant and the right is diminutive. The left A1 appears mildly irregular. The anterior communicating artery is normal. The right ACA A2 segment is mildly to moderately irregular in 2 different places, with moderate distal A2 stenosis (series [id number], image 2). The bilateral MCA M1 segments are patent and mildly tortuous without irregularity or stenosis. Both MCA bifurcations are patent.  There is mild irregularity in the right MCA posterior most M2 branch. No convincing left MCA branch irregularity. IMPRESSION: 1. No acute intracranial abnormality, but advanced signal abnormality in the subcortical cerebral white matter. These white matter changes are nonspecific with top differential considerations of accelerated/hereditary small vessel ischemia and sequelae of vasculitis or hypercoagulable state. 2. Abnormal signal also in the pons and this most  resembles chronic small vessel ischemia, including presence of a chronic micro-hemorrhage. 3. Negative noncontrast neck MRA, but multifocal large and medium size vessel irregularity on the intracranial MRA. This includes the basilar tip, right PCA P1, right ACA A2, and right MCA M2. This appearance somewhat favors advanced for age atherosclerosis over intracranial vasculitis. Electronically Signed   By: Genevie Ann M.D.   On: 02/10/2018 19:37   US Scrotum  Result Date: 02/10/2018 CLINICAL DATA:  LEFT testicular swelling for 1-1/2 years. EXAM: SCROTAL ULTRASOUND DOPPLER ULTRASOUND OF THE TESTICLES TECHNIQUE: Complete ultrasound examination of the testicles, epididymis, and other scrotal structures was performed. Color and spectral Doppler ultrasound were also utilized to evaluate blood flow to the testicles. COMPARISON:  Scrotal ultrasound August 24, 2015 FINDINGS: Right testicle Measurements: 4 x 1.4 x 2.3 cm. No mass or microlithiasis visualized. Left testicle Measurements: 5 x 2.5 x 2.2 cm. No mass or microlithiasis visualized. Right epididymis:  Nonacute.  11 mm anechoic simple cyst. Left epididymis:  Nonacute. 17 mm anechoic simple cyst. Hydrocele: Small RIGHT and large LEFT hydroceles with minimal echogenic debris. Pulsed Doppler interrogation of both testes demonstrates normal low resistance arterial and venous waveforms bilaterally. 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 No acute scrotal process. Electronically Signed   By: Elon Alas M.D.   On: 02/10/2018 22:22   US Scrotum Doppler  Result Date: 02/10/2018 CLINICAL DATA:  Left testicular swelling for 1.5 years EXAM: SCROTAL ULTRASOUND DOPPLER ULTRASOUND OF THE TESTICLES TECHNIQUE: Complete ultrasound examination of the testicles, epididymis, and other scrotal structures was performed. Color and spectral Doppler ultrasound were also utilized to evaluate blood flow to the testicles. COMPARISON:  None. FINDINGS: Right  testicle Measurements: 4 x 1.4 x 2.3 cm. No mass or microlithiasis visualized. Left testicle Measurements: 5 x 2.5 x 2.2 cm. No mass or microlithiasis visualized. Right epididymis: Simple 1 x 0.4 x 1.1 cm epididymal cyst no the head. Left epididymis:  1.7 x 0.7 x 0.8 cm epididymal cyst along body. Hydrocele: Moderate to large left and small right hydroceles, complex on the left due to internal debris. Varicocele:  None visualized. Pulsed Doppler interrogation of both testes demonstrates normal low resistance arterial and venous waveforms bilaterally. IMPRESSION: 1. No evidence of testicular torsion or mass. 2. Bilateral epididymal cysts. 3. Moderate to large left hydrocele with small right hydrocele that may have accounted for the left-sided testicular swelling. Electronically Signed   By: Ashley Royalty M.D.   On: 02/10/2018 22:38    Procedures Procedures (including critical care time)  CRITICAL CARE Performed by: Gwenyth Allegra Kemonie Cutillo Total critical care time: 60 minutes Critical care time was exclusive of separately billable procedures and treating other patients. Critical care was necessary to treat or prevent imminent or life-threatening deterioration. Critical care was time spent personally by me on the following activities: development of treatment plan with patient and/or surrogate as well as nursing, discussions with consultants, evaluation of patient's response to treatment, examination of patient, obtaining history from patient or surrogate, ordering and performing treatments and interventions, ordering and review of laboratory studies, ordering and review of radiographic  studies, pulse oximetry and re-evaluation of patient's condition.   Medications Ordered in ED Medications  nicardipine (CARDENE) 20mg  in 0.86% saline 260ml IV infusion (0.1 mg/ml) (15 mg/hr Intravenous New Bag/Given 02/10/18 1954)  nicardipine (CARDENE) 20mg  in 0.86% saline 224ml IV infusion (0.1 mg/ml) (0 mg/hr Intravenous  Stopped 02/10/18 1926)     Initial Impression / Assessment and Plan / ED Course  I have reviewed the triage vital signs and the nursing notes.  Pertinent labs & imaging results that were available during my care of the patient were reviewed by me and considered in my medical decision making (see chart for details).     Stanley [name] is a 42 y.o. male with past medical history significant for severe hypertension and chronic kidney disease who presents with a syncopal episode, headache, left neck pain, and vision loss.  Patient reports that he has been having headaches for several months which she describes is in his left neck and left head and causing intermittent left vision loss.  He reports he sees white flashes and that his vision gets dark.  He reports this happens frequently.  He says that today he was spending time with family when he started having his headaches.  He reports that he went to go take his blood pressure medicines but then had the vision loss on the left eye.  He then says that while walking back to his family he syncopized landing on the couch.  He is not sure how long he was out for but reports that when EMS got to him his blood pressure was 408 systolic.  Patient reports that he had severe, 10 out of 10 headache just prior to syncopal episode and had palpitations.  He denied any chest pain, shortness of breath.  He denies conservation, diarrhea, fevers, or chills.  He does report decreased urination recently.  Patient blood pressure is in the 144 systolic on arrival but he is not a symptomatic.  He reports no vision change and specifically denies any headaches.  He still reports some mild left neck soreness.  He says that he has been taking his medications as directed and has not used any drugs aside from occasional marijuana.  He denies any traumas.  He reports that this episode occurred at approximate 3 PM, 45 minutes ago.  Patient currently says he is resting comfortably.    On  exam, patient had no visual field deficits.  Normal coordination with finger-nose finger testing.  Left neck was slightly tender laterally but patient had normal extraocular movements and facial sensation was intact.  No facial droop.  Patient had normal sensation and strength in all extremities.  Lungs were clear and chest was nontender.  Abdomen nontender.  Back nontender.  No evidence of trauma seen.    EKG appears similar to prior with T wave inversions.  No STEMI seen.    Clinically I am concerned about hypertensive emergency causing his syncopal episode palpitations and his headaches however in the setting of severe headache that is unilateral with a vision change I am concerned about possible dissection or TIA.  Recent lab testing shows that patient's creatinine is very elevated and his GFR is low.  Do not feel he would be a good candidate for a CTA of the head and neck at this time.  Will order Noncon CT to start and speak with neurology for further imaging recommendations.  We will also discuss with them safe blood pressure management strategies.  Patient will work-up  to look for other endorgan damage from his hypertension however I suspect patient will need admission for hypertensive emergency.  4:15 PM Neurology was called for guidance on imaging.  They agreed with MRA and MRI of the head and neck as well as aggressive blood pressure management.  Patient will be started on nicardipine drip with a goal systolic blood pressure between 120-160 as recommended.  Anticipate admission after work-up is completed.  MRIs showed evidence of small vessel ischemia and possibly small microhemorrhages however no acute intracranial abnormality.  They also considered things like vasculitis or hypercoagulable state.  As there does not appear to be acute stroke, patient will be treated for hypertensive emergency causing his syncope and neurologic deficits.  Patient is still on a nicardipine drip with  improvement in his blood pressure into the low 503T systolic.  Unassigned medical team will be called for further management.  8:10 PM Due to his hypertension medication needs with a nicardipine drip at this time, medical care was called.  They will come to the patient and admit to the ICU.  Patient continues to be symptom-free at this time.   Final Clinical Impressions(s) / ED Diagnoses   Final diagnoses:  Hypertensive emergency  Syncope and collapse  Transient vision disturbance of left eye    Clinical Impression: 1. Hypertensive emergency   2. Syncope and collapse   3. Transient vision disturbance of left eye     Disposition: Admit  This note was prepared with assistance of Dragon voice recognition software. Occasional wrong-word or sound-a-like substitutions may have occurred due to the inherent limitations of voice recognition software.     Delainie Chavana, Gwenyth Allegra, MD 02/11/18 [phone]

## 2018-02-11 ENCOUNTER — Inpatient Hospital Stay (HOSPITAL_COMMUNITY): Payer: Self-pay

## 2018-02-11 ENCOUNTER — Encounter (HOSPITAL_COMMUNITY): Payer: Self-pay

## 2018-02-11 ENCOUNTER — Other Ambulatory Visit: Payer: Self-pay

## 2018-02-11 DIAGNOSIS — I340 Nonrheumatic mitral (valve) insufficiency: Secondary | ICD-10-CM

## 2018-02-11 DIAGNOSIS — I351 Nonrheumatic aortic (valve) insufficiency: Secondary | ICD-10-CM

## 2018-02-11 LAB — CBC
HCT: 37.9 % — ABNORMAL LOW (ref 39.0–52.0)
Hemoglobin: 12.2 g/dL — ABNORMAL LOW (ref 13.0–17.0)
MCH: 27.3 pg (ref 26.0–34.0)
MCHC: 32.2 g/dL (ref 30.0–36.0)
MCV: 84.8 fL (ref 78.0–100.0)
PLATELETS: 330 10*3/uL (ref 150–400)
RBC: 4.47 MIL/uL (ref 4.22–5.81)
RDW: 15.2 % (ref 11.5–15.5)
WBC: 4.6 10*3/uL (ref 4.0–10.5)

## 2018-02-11 LAB — BASIC METABOLIC PANEL
Anion gap: 11 (ref 5–15)
BUN: 21 mg/dL — AB (ref 6–20)
CALCIUM: 8.9 mg/dL (ref 8.9–10.3)
CHLORIDE: 106 mmol/L (ref 101–111)
CO2: 23 mmol/L (ref 22–32)
CREATININE: 3.11 mg/dL — AB (ref 0.61–1.24)
GFR, EST AFRICAN AMERICAN: 27 mL/min — AB (ref >60)
GFR, EST NON AFRICAN AMERICAN: 23 mL/min — AB (ref >60)
Glucose, Bld: 83 mg/dL (ref 65–99)
Potassium: 3.5 mmol/L (ref 3.5–5.1)
SODIUM: 140 mmol/L (ref 135–145)

## 2018-02-11 LAB — PHOSPHORUS: Phosphorus: 4.5 mg/dL (ref 2.5–4.6)

## 2018-02-11 LAB — ECHOCARDIOGRAM COMPLETE
Height: 68 in
Weight: 3107.6 oz

## 2018-02-11 LAB — URINE CULTURE: CULTURE: NO GROWTH

## 2018-02-11 LAB — TROPONIN I
TROPONIN I: 0.04 ng/mL — AB (ref <0.03)
TROPONIN I: 0.07 ng/mL — AB (ref <0.03)
Troponin I: 0.06 ng/mL (ref <0.03)

## 2018-02-11 LAB — MRSA PCR SCREENING: MRSA by PCR: NEGATIVE

## 2018-02-11 LAB — GLUCOSE, CAPILLARY: GLUCOSE-CAPILLARY: 83 mg/dL (ref 65–99)

## 2018-02-11 LAB — MAGNESIUM: MAGNESIUM: 2 mg/dL (ref 1.7–2.4)

## 2018-02-11 MED ORDER — HYDRALAZINE HCL 20 MG/ML IJ SOLN
10.0000 mg | INTRAMUSCULAR | Status: DC | PRN
  Administered 2018-02-11: 10 mg via INTRAVENOUS
  Filled 2018-02-11: qty 1

## 2018-02-11 MED ORDER — ATORVASTATIN CALCIUM 40 MG PO TABS
40.0000 mg | ORAL_TABLET | Freq: Every day | ORAL | Status: DC
  Administered 2018-02-11 – 2018-02-13 (×3): 40 mg via ORAL
  Filled 2018-02-11 (×3): qty 1

## 2018-02-11 MED ORDER — NICARDIPINE HCL IN NACL 20-0.86 MG/200ML-% IV SOLN
3.0000 mg/h | INTRAVENOUS | Status: DC
  Administered 2018-02-11: 3 mg/h via INTRAVENOUS
  Administered 2018-02-11: 3.5 mg/h via INTRAVENOUS
  Administered 2018-02-11: 3 mg/h via INTRAVENOUS
  Filled 2018-02-11 (×3): qty 200

## 2018-02-11 NOTE — CV Procedure (Signed)
Attempted stat 2D Echo, patient wants to use bathroom and needs to eat, he said he was very angry. Nurses were notified.  Will try again at a later time.  [name]

## 2018-02-11 NOTE — Progress Notes (Signed)
  2D Echocardiogram has been performed.  Stanley [name] [name] [name] 02/11/2018, 10:17 AM

## 2018-02-11 NOTE — Progress Notes (Signed)
PULMONARY / CRITICAL CARE MEDICINE   Name: Stanley [name] MRN: [id number] DOB: 01-Jun-1976    ADMISSION DATE:  02/10/2018 CONSULTATION DATE:  02/10/2018  REFERRING MD:  Dr. Sherry Ruffing   CHIEF COMPLAINT:  HTN Urgency   HISTORY OF PRESENT ILLNESS:   42 year old male with history of medical non-compliance with PMH of HTN, CKD stage 4, Diastolic Heart Failure with EF 55-60, and Current Tobacco Abuse/Polysubstance Abuse of THC  Presents to ED on 5/11 with syncope and vision changes. BP 255/138. CT and MRI negative for acute event. Placed on Cardene gtt. PCCM asked to admit.   Patient states that he took home BP medications today up until noon, however, staff nurse reports that he told them on arrival that he hasn't taken any since yest  (5/10) AM. Noted Admissions in January and September of 2018 for uncontrolled HTN.   SUBJECTIVE:  Blood pressure control has been marginal Patient complains of being hungry, thirsty wants a diet Headache improved  VITAL SIGNS: BP (!) 201/116   Pulse 71   Temp 98.7 F (37.1 C) (Oral)   Resp 20   Ht 5\' 8"  (1.727 m)   Wt 88.1 kg (194 lb 3.6 oz)   SpO2 97%   BMI 29.53 kg/m   HEMODYNAMICS:    VENTILATOR SETTINGS:    INTAKE / OUTPUT: I/O last 3 completed shifts: In: 170.4 [I.V.:170.4] Out: 500 [Urine:500]  PHYSICAL EXAMINATION: General: Well-appearing, no distress Neuro: Pupils reactive, alert, oriented, follows commands, answers questions HEENT: Oropharynx dry Cardiovascular: Regular, S4 present Lungs: Clear bilaterally, no crackles, no wheezes Abdomen: Thin, soft, positive bowel sounds Musculoskeletal: No significant edema Skin: Not examined, note report scrotal edema from 5/11  LABS:  BMET Recent Labs  Lab 02/10/18 1527 02/11/18 0646  NA 140 140  K 3.6 3.5  CL 105 106  CO2 24 23  BUN 24* 21*  CREATININE 3.18* 3.11*  GLUCOSE 88 83    Electrolytes Recent Labs  Lab 02/10/18 1527 02/10/18 1544 02/11/18 0646  CALCIUM 9.1  --   8.9  MG  --  2.2 2.0  PHOS  --   --  4.5    CBC Recent Labs  Lab 02/10/18 1527 02/11/18 0646  WBC 5.1 4.6  HGB 12.3* 12.2*  HCT 37.8* 37.9*  PLT 313 330    Coag's No results for input(s): APTT, INR in the last 168 hours.  Sepsis Markers No results for input(s): LATICACIDVEN, PROCALCITON, O2SATVEN in the last 168 hours.  ABG No results for input(s): PHART, PCO2ART, PO2ART in the last 168 hours.  Liver Enzymes No results for input(s): AST, ALT, ALKPHOS, BILITOT, ALBUMIN in the last 168 hours.  Cardiac Enzymes Recent Labs  Lab 02/11/18 0016 02/11/18 0646  TROPONINI 0.07* 0.06*    Glucose Recent Labs  Lab 02/10/18 1654 02/11/18 0916  GLUCAP 85 83    Imaging Dg Chest 2 View  Result Date: 02/10/2018 CLINICAL DATA:  Witnessed syncopal episode at home, palpitations, history hypertension, stroke, stage IV chronic kidney disease, smoker EXAM: CHEST - 2 VIEW COMPARISON:  11/15/2016 FINDINGS: Borderline enlargement of cardiac silhouette with slight pulmonary vascular congestion. Mediastinal contours and pulmonary vascularity normal. Lungs clear. No pleural effusion or pneumothorax. Bones unremarkable. IMPRESSION: No acute abnormalities. Borderline enlargement of cardiac silhouette with slight pulmonary vascular congestion Electronically Signed   By: Lavonia Dana M.D.   On: 02/10/2018 16:06   Ct Head Wo Contrast  Result Date: 02/10/2018 CLINICAL DATA:  42 year old male with severe headache and  syncope today. EXAM: CT HEAD WITHOUT CONTRAST TECHNIQUE: Contiguous axial images were obtained from the base of the skull through the vertex without intravenous contrast. COMPARISON:  01/16/2018 CT and prior studies FINDINGS: Brain: No evidence of acute infarction, hemorrhage, hydrocephalus, extra-axial collection or mass lesion/mass effect. Vascular: No hyperdense vessel or unexpected calcification. Skull: Normal. Negative for fracture or focal lesion. Sinuses/Orbits: No acute finding.  Other: None. IMPRESSION: Unremarkable noncontrast head CT. Electronically Signed   By: Margarette Canada M.D.   On: 02/10/2018 17:12   Mr Angiogram Head Wo Contrast  Result Date: 02/10/2018 CLINICAL DATA:  42 year old male with severe headache and witnessed syncope. Blurred vision. EXAM: MRI HEAD WITHOUT CONTRAST MRA HEAD WITHOUT CONTRAST MRA NECK WITHOUT CONTRAST TECHNIQUE: Multiplanar, multiecho pulse sequences of the brain and surrounding structures were obtained without intravenous contrast. Angiographic images of the Circle of Willis were obtained using MRA technique without intravenous contrast. Angiographic images of the neck were obtained using MRA technique without intravenous contrast. Carotid stenosis measurements (when applicable) are obtained utilizing NASCET criteria, using the distal internal carotid diameter as the denominator. COMPARISON:  Head CT without contrast 1635 hours today, and earlier. Prior brain MRI and intracranial MRA 06/12/2017. FINDINGS: MRI HEAD FINDINGS Brain: No restricted diffusion to suggest acute infarction. No midline shift, mass effect, evidence of mass lesion, ventriculomegaly, extra-axial collection or acute intracranial hemorrhage. Cervicomedullary junction and pituitary are within normal limits. Widely scattered small foci of mostly subcortical cerebral white matter T2 and FLAIR hyperintensity. The extent is moderate to severe for age. The configuration is nonspecific. No associated cortical encephalomalacia or chronic cerebral blood products. However, there is evidence of chronic microhemorrhage in the right pons and several pontine T2 hyperintense foci which resembles small chronic lacune is a (series 14001, image 9). The cerebellum and deep gray matter nuclei appear normal. Vascular: Major intracranial vascular flow voids are stable. Skull and upper cervical spine: Negative visible cervical spine. Normal bone marrow signal. Sinuses/Orbits: Normal orbits soft tissues.  Paranasal sinuses are well pneumatized today. Other: The mastoid air cells are clear. Visible internal auditory structures appear normal. Scalp and face soft tissues appear negative. MRA NECK FINDINGS Noncontrast time-of-flight MRA imaging demonstrates antegrade flow in the bilateral carotid and vertebral arteries throughout the neck and to the skull base. The carotid bifurcations and proximal ICAs have a normal time-of-flight appearance. The left vertebral artery is slightly dominant. No arterial stenosis identified in the neck. MRA HEAD FINDINGS Antegrade flow in the posterior circulation with mildly dominant distal left vertebral artery. Patent PICA origins. No distal vertebral stenosis. Patent basilar artery and AICA origins. There is mild irregularity in the distal basilar without significant stenosis. The SCA and PCA origins appear normal. There is moderate irregularity and stenosis in the right PCA P1 segment (series [id number], image 5). The remaining bilateral PCA branches appear normal. Posterior communicating arteries are diminutive or absent. Antegrade flow in both ICA siphons. Suggestion of vessel irregularity at both carotid anterior genu, but no definite siphon stenosis. Normal ophthalmic artery origins. Patent carotid termini. The left ACA A1 segment is dominant and the right is diminutive. The left A1 appears mildly irregular. The anterior communicating artery is normal. The right ACA A2 segment is mildly to moderately irregular in 2 different places, with moderate distal A2 stenosis (series [id number], image 2). The bilateral MCA M1 segments are patent and mildly tortuous without irregularity or stenosis. Both MCA bifurcations are patent. There is mild irregularity in the right MCA posterior most M2 branch. No  convincing left MCA branch irregularity. IMPRESSION: 1. No acute intracranial abnormality, but advanced signal abnormality in the subcortical cerebral white matter. These white matter changes are  nonspecific with top differential considerations of accelerated/hereditary small vessel ischemia and sequelae of vasculitis or hypercoagulable state. 2. Abnormal signal also in the pons and this most resembles chronic small vessel ischemia, including presence of a chronic micro-hemorrhage. 3. Negative noncontrast neck MRA, but multifocal large and medium size vessel irregularity on the intracranial MRA. This includes the basilar tip, right PCA P1, right ACA A2, and right MCA M2. This appearance somewhat favors advanced for age atherosclerosis over intracranial vasculitis. Electronically Signed   By: Genevie Ann M.D.   On: 02/10/2018 19:37   Mr Jodene Nam Neck Wo Contrast  Result Date: 02/10/2018 CLINICAL DATA:  42 year old male with severe headache and witnessed syncope. Blurred vision. EXAM: MRI HEAD WITHOUT CONTRAST MRA HEAD WITHOUT CONTRAST MRA NECK WITHOUT CONTRAST TECHNIQUE: Multiplanar, multiecho pulse sequences of the brain and surrounding structures were obtained without intravenous contrast. Angiographic images of the Circle of Willis were obtained using MRA technique without intravenous contrast. Angiographic images of the neck were obtained using MRA technique without intravenous contrast. Carotid stenosis measurements (when applicable) are obtained utilizing NASCET criteria, using the distal internal carotid diameter as the denominator. COMPARISON:  Head CT without contrast 1635 hours today, and earlier. Prior brain MRI and intracranial MRA 06/12/2017. FINDINGS: MRI HEAD FINDINGS Brain: No restricted diffusion to suggest acute infarction. No midline shift, mass effect, evidence of mass lesion, ventriculomegaly, extra-axial collection or acute intracranial hemorrhage. Cervicomedullary junction and pituitary are within normal limits. Widely scattered small foci of mostly subcortical cerebral white matter T2 and FLAIR hyperintensity. The extent is moderate to severe for age. The configuration is nonspecific. No  associated cortical encephalomalacia or chronic cerebral blood products. However, there is evidence of chronic microhemorrhage in the right pons and several pontine T2 hyperintense foci which resembles small chronic lacune is a (series 14001, image 9). The cerebellum and deep gray matter nuclei appear normal. Vascular: Major intracranial vascular flow voids are stable. Skull and upper cervical spine: Negative visible cervical spine. Normal bone marrow signal. Sinuses/Orbits: Normal orbits soft tissues. Paranasal sinuses are well pneumatized today. Other: The mastoid air cells are clear. Visible internal auditory structures appear normal. Scalp and face soft tissues appear negative. MRA NECK FINDINGS Noncontrast time-of-flight MRA imaging demonstrates antegrade flow in the bilateral carotid and vertebral arteries throughout the neck and to the skull base. The carotid bifurcations and proximal ICAs have a normal time-of-flight appearance. The left vertebral artery is slightly dominant. No arterial stenosis identified in the neck. MRA HEAD FINDINGS Antegrade flow in the posterior circulation with mildly dominant distal left vertebral artery. Patent PICA origins. No distal vertebral stenosis. Patent basilar artery and AICA origins. There is mild irregularity in the distal basilar without significant stenosis. The SCA and PCA origins appear normal. There is moderate irregularity and stenosis in the right PCA P1 segment (series [id number], image 5). The remaining bilateral PCA branches appear normal. Posterior communicating arteries are diminutive or absent. Antegrade flow in both ICA siphons. Suggestion of vessel irregularity at both carotid anterior genu, but no definite siphon stenosis. Normal ophthalmic artery origins. Patent carotid termini. The left ACA A1 segment is dominant and the right is diminutive. The left A1 appears mildly irregular. The anterior communicating artery is normal. The right ACA A2 segment is  mildly to moderately irregular in 2 different places, with moderate distal A2 stenosis (series  [id number], image 2). The bilateral MCA M1 segments are patent and mildly tortuous without irregularity or stenosis. Both MCA bifurcations are patent. There is mild irregularity in the right MCA posterior most M2 branch. No convincing left MCA branch irregularity. IMPRESSION: 1. No acute intracranial abnormality, but advanced signal abnormality in the subcortical cerebral white matter. These white matter changes are nonspecific with top differential considerations of accelerated/hereditary small vessel ischemia and sequelae of vasculitis or hypercoagulable state. 2. Abnormal signal also in the pons and this most resembles chronic small vessel ischemia, including presence of a chronic micro-hemorrhage. 3. Negative noncontrast neck MRA, but multifocal large and medium size vessel irregularity on the intracranial MRA. This includes the basilar tip, right PCA P1, right ACA A2, and right MCA M2. This appearance somewhat favors advanced for age atherosclerosis over intracranial vasculitis. Electronically Signed   By: Genevie Ann M.D.   On: 02/10/2018 19:37   Mr Brain Wo Contrast  Result Date: 02/10/2018 CLINICAL DATA:  42 year old male with severe headache and witnessed syncope. Blurred vision. EXAM: MRI HEAD WITHOUT CONTRAST MRA HEAD WITHOUT CONTRAST MRA NECK WITHOUT CONTRAST TECHNIQUE: Multiplanar, multiecho pulse sequences of the brain and surrounding structures were obtained without intravenous contrast. Angiographic images of the Circle of Willis were obtained using MRA technique without intravenous contrast. Angiographic images of the neck were obtained using MRA technique without intravenous contrast. Carotid stenosis measurements (when applicable) are obtained utilizing NASCET criteria, using the distal internal carotid diameter as the denominator. COMPARISON:  Head CT without contrast 1635 hours today, and earlier. Prior  brain MRI and intracranial MRA 06/12/2017. FINDINGS: MRI HEAD FINDINGS Brain: No restricted diffusion to suggest acute infarction. No midline shift, mass effect, evidence of mass lesion, ventriculomegaly, extra-axial collection or acute intracranial hemorrhage. Cervicomedullary junction and pituitary are within normal limits. Widely scattered small foci of mostly subcortical cerebral white matter T2 and FLAIR hyperintensity. The extent is moderate to severe for age. The configuration is nonspecific. No associated cortical encephalomalacia or chronic cerebral blood products. However, there is evidence of chronic microhemorrhage in the right pons and several pontine T2 hyperintense foci which resembles small chronic lacune is a (series 14001, image 9). The cerebellum and deep gray matter nuclei appear normal. Vascular: Major intracranial vascular flow voids are stable. Skull and upper cervical spine: Negative visible cervical spine. Normal bone marrow signal. Sinuses/Orbits: Normal orbits soft tissues. Paranasal sinuses are well pneumatized today. Other: The mastoid air cells are clear. Visible internal auditory structures appear normal. Scalp and face soft tissues appear negative. MRA NECK FINDINGS Noncontrast time-of-flight MRA imaging demonstrates antegrade flow in the bilateral carotid and vertebral arteries throughout the neck and to the skull base. The carotid bifurcations and proximal ICAs have a normal time-of-flight appearance. The left vertebral artery is slightly dominant. No arterial stenosis identified in the neck. MRA HEAD FINDINGS Antegrade flow in the posterior circulation with mildly dominant distal left vertebral artery. Patent PICA origins. No distal vertebral stenosis. Patent basilar artery and AICA origins. There is mild irregularity in the distal basilar without significant stenosis. The SCA and PCA origins appear normal. There is moderate irregularity and stenosis in the right PCA P1 segment  (series [id number], image 5). The remaining bilateral PCA branches appear normal. Posterior communicating arteries are diminutive or absent. Antegrade flow in both ICA siphons. Suggestion of vessel irregularity at both carotid anterior genu, but no definite siphon stenosis. Normal ophthalmic artery origins. Patent carotid termini. The left ACA A1 segment is dominant and the right is  diminutive. The left A1 appears mildly irregular. The anterior communicating artery is normal. The right ACA A2 segment is mildly to moderately irregular in 2 different places, with moderate distal A2 stenosis (series [id number], image 2). The bilateral MCA M1 segments are patent and mildly tortuous without irregularity or stenosis. Both MCA bifurcations are patent. There is mild irregularity in the right MCA posterior most M2 branch. No convincing left MCA branch irregularity. IMPRESSION: 1. No acute intracranial abnormality, but advanced signal abnormality in the subcortical cerebral white matter. These white matter changes are nonspecific with top differential considerations of accelerated/hereditary small vessel ischemia and sequelae of vasculitis or hypercoagulable state. 2. Abnormal signal also in the pons and this most resembles chronic small vessel ischemia, including presence of a chronic micro-hemorrhage. 3. Negative noncontrast neck MRA, but multifocal large and medium size vessel irregularity on the intracranial MRA. This includes the basilar tip, right PCA P1, right ACA A2, and right MCA M2. This appearance somewhat favors advanced for age atherosclerosis over intracranial vasculitis. Electronically Signed   By: Genevie Ann M.D.   On: 02/10/2018 19:37   US Scrotum  Result Date: 02/10/2018 CLINICAL DATA:  LEFT testicular swelling for 1-1/2 years. EXAM: SCROTAL ULTRASOUND DOPPLER ULTRASOUND OF THE TESTICLES TECHNIQUE: Complete ultrasound examination of the testicles, epididymis, and other scrotal structures was performed. Color and  spectral Doppler ultrasound were also utilized to evaluate blood flow to the testicles. COMPARISON:  Scrotal ultrasound August 24, 2015 FINDINGS: Right testicle Measurements: 4 x 1.4 x 2.3 cm. No mass or microlithiasis visualized. Left testicle Measurements: 5 x 2.5 x 2.2 cm. No mass or microlithiasis visualized. Right epididymis:  Nonacute.  11 mm anechoic simple cyst. Left epididymis:  Nonacute. 17 mm anechoic simple cyst. Hydrocele: Small RIGHT and large LEFT hydroceles with minimal echogenic debris. Pulsed Doppler interrogation of both testes demonstrates normal low resistance arterial and venous waveforms bilaterally. 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 No acute scrotal process. Electronically Signed   By: Elon Alas M.D.   On: 02/10/2018 22:22   US Scrotum Doppler  Result Date: 02/10/2018 CLINICAL DATA:  Left testicular swelling for 1.5 years EXAM: SCROTAL ULTRASOUND DOPPLER ULTRASOUND OF THE TESTICLES TECHNIQUE: Complete ultrasound examination of the testicles, epididymis, and other scrotal structures was performed. Color and spectral Doppler ultrasound were also utilized to evaluate blood flow to the testicles. COMPARISON:  None. FINDINGS: Right testicle Measurements: 4 x 1.4 x 2.3 cm. No mass or microlithiasis visualized. Left testicle Measurements: 5 x 2.5 x 2.2 cm. No mass or microlithiasis visualized. Right epididymis: Simple 1 x 0.4 x 1.1 cm epididymal cyst no the head. Left epididymis:  1.7 x 0.7 x 0.8 cm epididymal cyst along body. Hydrocele: Moderate to large left and small right hydroceles, complex on the left due to internal debris. Varicocele:  None visualized. Pulsed Doppler interrogation of both testes demonstrates normal low resistance arterial and venous waveforms bilaterally. IMPRESSION: 1. No evidence of testicular torsion or mass. 2. Bilateral epididymal cysts. 3. Moderate to large left hydrocele with small right hydrocele that may have accounted for  the left-sided testicular swelling. Electronically Signed   By: Ashley Royalty M.D.   On: 02/10/2018 22:38     STUDIES:  CXR 5/11 > No acute abnormalities. Borderline enlargement of cardiac silhouette with slight pulmonary vascular congestion CT Head 5/11 > Unremarkable noncontrast head CT MR Brain/MRA Head/Neck 5/11 > 1. No acute intracranial abnormality, but advanced signal abnormality in the subcortical cerebral white matter. These white  matter changes are nonspecific with top differential considerations of accelerated/hereditary small vessel ischemia and sequelae of vasculitis or hypercoagulable state. Abnormal signal also in the pons and this most resembles chronic small vessel ischemia, including presence of a chronic micro-hemorrhage. Negative noncontrast neck MRA, but multifocal large and medium size vessel irregularity on the intracranial MRA. This includes the basilar tip, right PCA P1, right ACA A2, and right MCA M2. This appearance somewhat favors advanced for age atherosclerosis over intracranial vasculitis.  CULTURES: None.   ANTIBIOTICS: None.   SIGNIFICANT EVENTS: 5/11 > Presents to ED   LINES/TUBES: PIV   DISCUSSION: 42 year old male with history of medical non-compliance presents to ED with syncope and vision changes. BP 255/138. Placed on Cardene gtt. PCCM asked to admit.   ASSESSMENT / PLAN:  HTN Urgency with +vision changes  (CT and MRI negative for acute, chronic changes noted)  H/O HTN (with medical non-compliance), Diastolic Heart Failure (EF 55-60) Plan  Cardiac monitoring Goal systolic blood pressure 956.  Once we maintain this for 6 hours we can change the goal to 160 His nicardipine was transiently turned off due to hypotension overnight.  He is now in a position where he needs to have it turned back on with systolics in the 387F.  We will restart and continue his home regimen.  Attempt to wean as we are able Clonidine 3 times daily, Norvasc 10mg   daily, labetalol 400mg  3 times daily, metoprolol XL 50 mg daily, hydralazine 100 mg 3 times daily Echocardiogram pending  Chronic Kidney Stage 4 (Baseline Crt 3.8)  Swelling to Testicles (Patient states has been ongoing since December).  Ultrasound shows moderate left and small right hydroceles Plan  Follow BMP Control blood pressure aggressively Likely needs urology follow-up as an outpatient  Tobacco Abuse  H/O Polysubstance Abuse (Reports THC for Sleep)  Plan  Smoking cessation Nicotine patch in place  VTE  Plan Heparin subcutaneous   FAMILY  - Updates: No family present  - Inter-disciplinary family meet or Palliative Care meeting due by: 02/17/2018   Independent CC time 77 minutes  Baltazar Apo, MD, PhD 02/11/2018, 10:52 AM [facility] [phone] or if no answer [phone]

## 2018-02-11 NOTE — Progress Notes (Signed)
CRITICAL VALUE ALERT  Critical Value:  Troponin 0.07  Date & Time Notied: 02/11/18   Provider Notified: Dr Deterding  Orders Received/Actions taken: No new oders

## 2018-02-12 LAB — BASIC METABOLIC PANEL
ANION GAP: 10 (ref 5–15)
BUN: 24 mg/dL — ABNORMAL HIGH (ref 6–20)
CALCIUM: 8.7 mg/dL — AB (ref 8.9–10.3)
CO2: 24 mmol/L (ref 22–32)
CREATININE: 3.19 mg/dL — AB (ref 0.61–1.24)
Chloride: 106 mmol/L (ref 101–111)
GFR calc Af Amer: 26 mL/min — ABNORMAL LOW (ref >60)
GFR calc non Af Amer: 23 mL/min — ABNORMAL LOW (ref >60)
Glucose, Bld: 89 mg/dL (ref 65–99)
Potassium: 3.3 mmol/L — ABNORMAL LOW (ref 3.5–5.1)
Sodium: 140 mmol/L (ref 135–145)

## 2018-02-12 LAB — CBC
HCT: 36.6 % — ABNORMAL LOW (ref 39.0–52.0)
HEMOGLOBIN: 11.9 g/dL — AB (ref 13.0–17.0)
MCH: 27.7 pg (ref 26.0–34.0)
MCHC: 32.5 g/dL (ref 30.0–36.0)
MCV: 85.1 fL (ref 78.0–100.0)
PLATELETS: 290 10*3/uL (ref 150–400)
RBC: 4.3 MIL/uL (ref 4.22–5.81)
RDW: 15.5 % (ref 11.5–15.5)
WBC: 4.1 10*3/uL (ref 4.0–10.5)

## 2018-02-12 LAB — BRAIN NATRIURETIC PEPTIDE: B NATRIURETIC PEPTIDE 5: 207.6 pg/mL — AB (ref 0.0–100.0)

## 2018-02-12 NOTE — Progress Notes (Signed)
PULMONARY / CRITICAL CARE MEDICINE   Name: Stanley [name] MRN: [id number] DOB: 1976-09-30    ADMISSION DATE:  02/10/2018 CONSULTATION DATE:  02/10/2018  REFERRING MD:  Dr. Sherry Ruffing   CHIEF COMPLAINT:  HTN Urgency   HISTORY OF PRESENT ILLNESS:   42 year old male with history of medical non-compliance with PMH of HTN, CKD stage 4, Diastolic Heart Failure with EF 55-60, and Current Tobacco Abuse/Polysubstance Abuse of THC  Presents to ED on 5/11 with syncope and vision changes. BP 255/138. CT and MRI negative for acute event. Placed on Cardene gtt. PCCM asked to admit.   Patient states that he took home BP medications today up until noon, however, staff nurse reports that he told them on arrival that he hasn't taken any since yest  (5/10) AM. Noted Admissions in January and September of 2018 for uncontrolled HTN.   5/13 The patient is feeling much better. His BP systolic is hovering in the 160s presently. He has been off Nicardipne since aboiut 5 :45 this AM. The headache and visual disturances have dissipated. Has restarted hiw homwe medications   improved  VITAL SIGNS: BP (!) 145/82   Pulse 63   Temp 97.9 F (36.6 C) (Oral)   Resp 19   Ht 5\' 8"  (1.727 m)   Wt 192 lb 3.9 oz (87.2 kg)   SpO2 97%   BMI 29.23 kg/m    INTAKE / OUTPUT: I/O last 3 completed shifts: In: 562.3 [I.V.:562.3] Out: 1500 [Urine:1500]  PHYSICAL EXAMINATION: General: Well-appearing, no distress Neuro: Pupils reactive, alert, oriented, follows commands, answers questions HEENT: Oropharynx dry Cardiovascular: Regular, S4 present Lungs: Clear bilaterally, no crackles, no wheezes Abdomen: Thin, soft, positive bowel sounds Musculoskeletal: No significant edema    LABS:  BMET Recent Labs  Lab 02/10/18 1527 02/11/18 0646 02/12/18 0312  NA 140 140 140  K 3.6 3.5 3.3*  CL 105 106 106  CO2 24 23 24   BUN 24* 21* 24*  CREATININE 3.18* 3.11* 3.19*  GLUCOSE 88 83 89    Electrolytes Recent Labs   Lab 02/10/18 1527 02/10/18 1544 02/11/18 0646 02/12/18 0312  CALCIUM 9.1  --  8.9 8.7*  MG  --  2.2 2.0  --   PHOS  --   --  4.5  --     CBC Recent Labs  Lab 02/10/18 1527 02/11/18 0646 02/12/18 0312  WBC 5.1 4.6 4.1  HGB 12.3* 12.2* 11.9*  HCT 37.8* 37.9* 36.6*  PLT 313 330 290    Coag's No results for input(s): APTT, INR in the last 168 hours.  Sepsis Markers No results for input(s): LATICACIDVEN, PROCALCITON, O2SATVEN in the last 168 hours.  ABG No results for input(s): PHART, PCO2ART, PO2ART in the last 168 hours.  Liver Enzymes No results for input(s): AST, ALT, ALKPHOS, BILITOT, ALBUMIN in the last 168 hours.  Cardiac Enzymes Recent Labs  Lab 02/11/18 0016 02/11/18 0646 02/11/18 1153  TROPONINI 0.07* 0.06* 0.04*    Glucose Recent Labs  Lab 02/10/18 1654 02/11/18 0916  GLUCAP 85 83    Imaging No results found.   STUDIES:  CXR 5/11 > No acute abnormalities. Borderline enlargement of cardiac silhouette with slight pulmonary vascular congestion CT Head 5/11 > Unremarkable noncontrast head CT MR Brain/MRA Head/Neck 5/11 > 1. No acute intracranial abnormality, but advanced signal abnormality in the subcortical cerebral white matter. These white matter changes are nonspecific with top differential considerations of accelerated/hereditary small vessel ischemia and sequelae of vasculitis or hypercoagulable  state. Abnormal signal also in the pons and this most resembles chronic small vessel ischemia, including presence of a chronic micro-hemorrhage. Negative noncontrast neck MRA, but multifocal large and medium size vessel irregularity on the intracranial MRA. This includes the basilar tip, right PCA P1, right ACA A2, and right MCA M2. This appearance somewhat favors advanced for age atherosclerosis over intracranial vasculitis.  CULTURES: None.   ANTIBIOTICS: None.   SIGNIFICANT EVENTS: 5/11 > Presents to ED   LINES/TUBES: PIV    DISCUSSION: 42 year old male with history of medical non-compliance presents to ED with syncope and vision changes. BP 255/138. Placed on Cardene gtt. PCCM asked to admit.   ASSESSMENT / PLAN:  HTN Urgency with +vision changes  (CT and MRI negative for acute, chronic changes noted)  H/O HTN (with medical non-compliance), Diastolic Heart Failure (EF 55-60) Plan  Cardiac monitoring Goal systolic blood pressure 712.  Once we maintain this for 6 hours we can change the goal to 160 His nicardipine was transiently turned off due to hypotension overnight.  He is now in a position where he needs to have it turned back on with systolics in the 197J.  We will restart and continue his home regimen.  Attempt to wean as we are able Clonidine 3 times daily, Norvasc 10mg  daily, labetalol 400mg  3 times daily, metoprolol XL 50 mg daily, hydralazine 100 mg 3 times daily Echocardiogram pending. I asked thepatient about his previoius history re BP. He told me he  Had a fariyl extensive w/u when he was doiagnosed for secondary cuaases of HTN.  Chronic Kidney Stage 4 (Baseline Crt 3.8)   Hisd renal fn was a little better 2 montyhs ago with creat of 2.86. Irttn is currently about 3.1.  Urology Swelling to Testicles (Patient states has been ongoing since December).   Ultrasound shows moderate left and small right hydroceles Plan  Follow BMP Control blood pressure aggressively    Tobacco Abuse  H/O Polysubstance Abuse (Reports [facility] for [facility])  Plan  Smoking cessation Nicotine patch in place  VTE  Plan Heparin subcutaneous   FAMILY  - Updates: No family present  The patient may be able to leavt the ICU if we are able to maintain off the Nicardipine drip.      Micheal Likens MD [facility]

## 2018-02-12 NOTE — Progress Notes (Signed)
Pt showered and told RN he did not like the salad at lunch. He got sick while in shower. Pt MD Dr. Debbora Dus made aware pt sbp 160's. Pt denies any report of nausea now. Will continue to monitor.

## 2018-02-12 NOTE — Plan of Care (Signed)
  Problem: Activity: Goal: Risk for activity intolerance will decrease Outcome: Progressing   Problem: Nutrition: Goal: Adequate nutrition will be maintained Outcome: Progressing   Problem: Coping: Goal: Level of anxiety will decrease Outcome: Progressing   Problem: Elimination: Goal: Will not experience complications related to bowel motility Outcome: Completed/Met Goal: Will not experience complications related to urinary retention Outcome: Completed/Met

## 2018-02-13 ENCOUNTER — Encounter (HOSPITAL_COMMUNITY): Payer: Self-pay | Admitting: *Deleted

## 2018-02-13 NOTE — Plan of Care (Signed)
  Problem: Education: Goal: Knowledge of General Education information will improve Outcome: Progressing   Problem: Clinical Measurements: Goal: Will remain free from infection Outcome: Completed/Met Goal: Diagnostic test results will improve Outcome: Completed/Met Goal: Respiratory complications will improve Outcome: Completed/Met Goal: Cardiovascular complication will be avoided Outcome: Completed/Met   Problem: Activity: Goal: Risk for activity intolerance will decrease Outcome: Completed/Met   Problem: Nutrition: Goal: Adequate nutrition will be maintained Outcome: Completed/Met   Problem: Coping: Goal: Level of anxiety will decrease Outcome: Completed/Met   Problem: Pain Managment: Goal: General experience of comfort will improve Outcome: Completed/Met   Problem: Safety: Goal: Ability to remain free from injury will improve Outcome: Completed/Met   Problem: Skin Integrity: Goal: Risk for impaired skin integrity will decrease Outcome: Completed/Met

## 2018-02-13 NOTE — Plan of Care (Signed)
Pt on RA B/P maintaining parameters on po antihypertensives

## 2018-02-13 NOTE — Progress Notes (Signed)
PULMONARY / CRITICAL CARE MEDICINE   Name: Stanley [name] MRN: [id number] DOB: [birth year]    ADMISSION DATE:  02/10/2018 CONSULTATION DATE:  02/10/2018  REFERRING MD:  Dr. Sherry Ruffing   CHIEF COMPLAINT:  HTN Urgency   HISTORY OF PRESENT ILLNESS:   42 year old male with history of medical non-compliance with PMH of HTN, CKD stage 4, Diastolic Heart Failure with EF 55-60, and Current Tobacco Abuse/Polysubstance Abuse of THC  Presents to ED on 5/11 with syncope and vision changes. BP 255/138. CT and MRI negative for acute event. Placed on Cardene gtt. PCCM asked to admit.   Patient states that he took home BP medications today up until noon, however, staff nurse reports that he told them on arrival that he hasn't taken any since yest  (5/10) AM. Noted Admissions in January and September of 2018 for uncontrolled HTN.   5/13 The patient is feeling much better. His BP systolic is hovering in the 160s presently. He has been off Nicardipne since aboiut 5 :45 this AM. The headache and visual disturances have dissipated. Has restarted hiw home medications.  5/14 His head ache has resolved. He feels well. No visual distubrbances. It appears that he can be downgraded to the regualr medical floor .      VITAL SIGNS: BP (!) 155/89   Pulse 67   Temp 98.5 F (36.9 C) (Oral)   Resp 18   Ht 5\' 8"  (1.727 m)   Wt 192 lb 3.9 oz (87.2 kg)   SpO2 98%   BMI 29.23 kg/m    INTAKE / OUTPUT: I/O last 3 completed shifts: In: 1432.7 [P.O.:1200; I.V.:232.7] Out: 1600 [Urine:1600]  PHYSICAL EXAMINATION: General: Well-appearing, no distress Neuro: Pupils reactive, alert, oriented, follows commands, answers questions HEENT: Oropharynx dry Cardiovascular: Regular, S4 present Lungs: Clear bilaterally, no crackles, no wheezes Abdomen: Thin, soft, positive bowel sounds Musculoskeletal: No significant edema    LABS:  BMET Recent Labs  Lab 02/10/18 1527 02/11/18 0646 02/12/18 0312  NA 140 140 140  K  3.6 3.5 3.3*  CL 105 106 106  CO2 24 23 24   BUN 24* 21* 24*  CREATININE 3.18* 3.11* 3.19*  GLUCOSE 88 83 89    Electrolytes Recent Labs  Lab 02/10/18 1527 02/10/18 1544 02/11/18 0646 02/12/18 0312  CALCIUM 9.1  --  8.9 8.7*  MG  --  2.2 2.0  --   PHOS  --   --  4.5  --     CBC Recent Labs  Lab 02/10/18 1527 02/11/18 0646 02/12/18 0312  WBC 5.1 4.6 4.1  HGB 12.3* 12.2* 11.9*  HCT 37.8* 37.9* 36.6*  PLT 313 330 290    Coag's No results for input(s): APTT, INR in the last 168 hours.  Sepsis Markers No results for input(s): LATICACIDVEN, PROCALCITON, O2SATVEN in the last 168 hours.  ABG No results for input(s): PHART, PCO2ART, PO2ART in the last 168 hours.  Liver Enzymes No results for input(s): AST, ALT, ALKPHOS, BILITOT, ALBUMIN in the last 168 hours.  Cardiac Enzymes Recent Labs  Lab 02/11/18 0016 02/11/18 0646 02/11/18 1153  TROPONINI 0.07* 0.06* 0.04*    Glucose Recent Labs  Lab 02/10/18 1654 02/11/18 0916  GLUCAP 85 83    Imaging No results found.   STUDIES:  CXR 5/11 > No acute abnormalities. Borderline enlargement of cardiac silhouette with slight pulmonary vascular congestion CT Head 5/11 > Unremarkable noncontrast head CT MR Brain/MRA Head/Neck 5/11 > 1. No acute intracranial abnormality, but advanced signal  abnormality in the subcortical cerebral white matter. These white matter changes are nonspecific with top differential considerations of accelerated/hereditary small vessel ischemia and sequelae of vasculitis or hypercoagulable state. Abnormal signal also in the pons and this most resembles chronic small vessel ischemia, including presence of a chronic micro-hemorrhage. Negative noncontrast neck MRA, but multifocal large and medium size vessel irregularity on the intracranial MRA. This includes the basilar tip, right PCA P1, right ACA A2, and right MCA M2. This appearance somewhat favors advanced for age atherosclerosis over  intracranial vasculitis.  CULTURES: None.   ANTIBIOTICS: None.   SIGNIFICANT EVENTS: 5/11 > Presents to ED   LINES/TUBES: PIV   DISCUSSION: 42 year old male with history of medical non-compliance presents to ED with syncope and vision changes. BP 255/138. Placed on Cardene gtt. PCCM asked to admit.   ASSESSMENT / PLAN:  HTN Urgency with +vision changes  (CT and MRI negative for acute, chronic changes noted)  H/O HTN (with medical non-compliance), Diastolic Heart Failure (EF 55-60) Plan  Cardiac monitoring Goal systolic blood pressure 211.  Once we maintain this for 6 hours we can change the goal to 160 His nicardipine was transiently turned off due to hypotension overnight.  He is now in a position where he needs to have it turned back on with systolics in the 155M.  We will restart and continue his home regimen.  Attempt to wean as we are able Clonidine 3 times daily, Norvasc 10mg  daily, labetalol 400mg  3 times daily, metoprolol XL 50 mg daily, hydralazine 100 mg 3 times daily Echocardiogram pending. I asked thepatient about his previoius history re BP. He told me he  Had a fariyl extensive w/u when he was doiagnosed for secondary causes of HTN. BP running about 080EM systolic. Medication may need some fine tuning prior to discharge  Chronic Kidney Stage 4 (Baseline Crt 3.8)   Hisd renal fn was a little better 2 montyhs ago with creat of 2.86. Irttn is currently about 3.1. Will need to be followed as an outpt.  Urology Swelling to Testicles (Patient states has been ongoing since December).   Ultrasound shows moderate left and small right hydroceles     Tobacco Abuse  H/O Polysubstance Abuse (Reports [facility] for [facility])  Plan  Smoking cessation Nicotine patch in place  VTE  Plan Heparin subcutaneous   The patient may be able to leavt the ICU if we are able to maintain off the Nicardipine drip.      Micheal Likens MD [facility]

## 2018-02-13 NOTE — Progress Notes (Signed)
PULMONARY / CRITICAL CARE MEDICINE   Name: Stanley [name] MRN: [id number] DOB: 1976/04/09    ADMISSION DATE:  02/10/2018 CONSULTATION DATE:  02/10/2018  REFERRING MD:  Dr. Sherry [name]   CHIEF COMPLAINT:  HTN Urgency   HISTORY OF PRESENT ILLNESS:   42 year old male with history of medical non-compliance with PMH of HTN, CKD stage 4, Diastolic Heart Failure with EF 55-60, and Current Tobacco Abuse/Polysubstance Abuse of THC  Presents to ED on 5/11 with syncope and vision changes. BP 255/138. CT and MRI negative for acute event. Placed on Cardene gtt. PCCM asked to admit.   Patient states that he took home BP medications today up until noon, however, staff nurse reports that he told them on arrival that he hasn't taken any since yest  (5/10) AM. Noted Admissions in January and September of 2018 for uncontrolled HTN.   5/13 The patient is feeling much better. His BP systolic is hovering in the 160s presently. He has been off Nicardipne since aboiut 5 :45 this AM. The headache and visual disturances have dissipated. Has restarted hiw home medications.  5/14 His head ache has resolved. His       VITAL SIGNS: BP (!) 154/84 (BP Location: Left Arm)   Pulse 65   Temp 98.5 F (36.9 C) (Oral)   Resp 15   Ht 5\' 8"  (1.727 m)   Wt 192 lb 3.9 oz (87.2 kg)   SpO2 98%   BMI 29.23 kg/m    INTAKE / OUTPUT: I/O last 3 completed shifts: In: 1432.7 [P.O.:1200; I.V.:232.7] Out: 1600 [Urine:1600]  PHYSICAL EXAMINATION: General: Well-appearing, no distress Neuro: Pupils reactive, alert, oriented, follows commands, answers questions HEENT: Oropharynx dry Cardiovascular: Regular, S4 present Lungs: Clear bilaterally, no crackles, no wheezes Abdomen: Thin, soft, positive bowel sounds Musculoskeletal: No significant edema    LABS:  BMET Recent Labs  Lab 02/10/18 1527 02/11/18 0646 02/12/18 0312  NA 140 140 140  K 3.6 3.5 3.3*  CL 105 106 106  CO2 24 23 24   BUN 24* 21* 24*  CREATININE  3.18* 3.11* 3.19*  GLUCOSE 88 83 89    Electrolytes Recent Labs  Lab 02/10/18 1527 02/10/18 1544 02/11/18 0646 02/12/18 0312  CALCIUM 9.1  --  8.9 8.7*  MG  --  2.2 2.0  --   PHOS  --   --  4.5  --     CBC Recent Labs  Lab 02/10/18 1527 02/11/18 0646 02/12/18 0312  WBC 5.1 4.6 4.1  HGB 12.3* 12.2* 11.9*  HCT 37.8* 37.9* 36.6*  PLT 313 330 290    Coag's No results for input(s): APTT, INR in the last 168 hours.  Sepsis Markers No results for input(s): LATICACIDVEN, PROCALCITON, O2SATVEN in the last 168 hours.  ABG No results for input(s): PHART, PCO2ART, PO2ART in the last 168 hours.  Liver Enzymes No results for input(s): AST, ALT, ALKPHOS, BILITOT, ALBUMIN in the last 168 hours.  Cardiac Enzymes Recent Labs  Lab 02/11/18 0016 02/11/18 0646 02/11/18 1153  TROPONINI 0.07* 0.06* 0.04*    Glucose Recent Labs  Lab 02/10/18 1654 02/11/18 0916  GLUCAP 85 83    Imaging No results found.   STUDIES:  CXR 5/11 > No acute abnormalities. Borderline enlargement of cardiac silhouette with slight pulmonary vascular congestion CT Head 5/11 > Unremarkable noncontrast head CT MR Brain/MRA Head/Neck 5/11 > 1. No acute intracranial abnormality, but advanced signal abnormality in the subcortical cerebral white matter. These white matter changes are nonspecific  with top differential considerations of accelerated/hereditary small vessel ischemia and sequelae of vasculitis or hypercoagulable state. Abnormal signal also in the pons and this most resembles chronic small vessel ischemia, including presence of a chronic micro-hemorrhage. Negative noncontrast neck MRA, but multifocal large and medium size vessel irregularity on the intracranial MRA. This includes the basilar tip, right PCA P1, right ACA A2, and right MCA M2. This appearance somewhat favors advanced for age atherosclerosis over intracranial vasculitis.  CULTURES: None.   ANTIBIOTICS: None.    SIGNIFICANT EVENTS: 5/11 > Presents to ED   LINES/TUBES: PIV   DISCUSSION: 42 year old male with history of medical non-compliance presents to ED with syncope and vision changes. BP 255/138. Placed on Cardene gtt. PCCM asked to admit.   ASSESSMENT / PLAN:  HTN Urgency with +vision changes  (CT and MRI negative for acute, chronic changes noted)  H/O HTN (with medical non-compliance), Diastolic Heart Failure (EF 55-60) Plan  Cardiac monitoring Goal systolic blood pressure 741.  Once we maintain this for 6 hours we can change the goal to 160 His nicardipine was transiently turned off due to hypotension overnight.  He is now in a position where he needs to have it turned back on with systolics in the 423T.  We will restart and continue his home regimen.  Attempt to wean as we are able Clonidine 3 times daily, Norvasc 10mg  daily, labetalol 400mg  3 times daily, metoprolol XL 50 mg daily, hydralazine 100 mg 3 times daily Echocardiogram pending. I asked thepatient about his previoius history re BP. He told me he  Had a fariyl extensive w/u when he was doiagnosed for secondary cuaases of HTN.  Chronic Kidney Stage 4 (Baseline Crt 3.8)   Hisd renal fn was a little better 2 montyhs ago with creat of 2.86. Irttn is currently about 3.1.  Urology Swelling to Testicles (Patient states has been ongoing since December).   Ultrasound shows moderate left and small right hydroceles Plan  Follow BMP Control blood pressure aggressively    Tobacco Abuse  H/O Polysubstance Abuse (Reports [facility] for [facility])  Plan  Smoking cessation Nicotine patch in place  VTE  Plan Heparin subcutaneous   FAMILY  - Updates: No family present  The patient may be able to leavt the ICU if we are able to maintain off the Nicardipine drip.      Stanley Likens MD [facility]

## 2018-02-14 DIAGNOSIS — I160 Hypertensive urgency: Secondary | ICD-10-CM

## 2018-02-14 MED ORDER — POTASSIUM CHLORIDE CRYS ER 20 MEQ PO TBCR
40.0000 meq | EXTENDED_RELEASE_TABLET | Freq: Once | ORAL | Status: AC
  Administered 2018-02-14: 40 meq via ORAL
  Filled 2018-02-14: qty 2

## 2018-02-14 NOTE — Care Management Note (Signed)
Case Management Note  Patient Details  Name: Stanley [name] MRN: [id number] Date of Birth: 04/03/1976  Subjective/Objective:   Admitted with HTN urgency ,hx of medical non-compliance , HTN, CKD stage 4, CHF/ EF 55-60, and Tobacco Abuse/Polysubstance Abuse of THC. Resides with family. Independent with ADL's, no DME needs.        PCP: Molli Barrows  Action/Plan: Transition to home. Post hospital f/u scheduled with PCP for 03/01/2018 @ 1300, [place] per NCM.  Pt states has transportation to home.   Pt to f/u with [facility] pharmacy with obtaining Rx meds.  Expected Discharge Date:   02/14/2018            Expected Discharge Plan:  Home/Self Care  In-House Referral:     Discharge planning Services  CM Consult, Follow-up appt scheduled, [facility]  Post Acute Care Choice:    Choice offered to:     DME Arranged:   N/A DME Agency:   N/A  HH Arranged:    N/A HH Agency:    N/A Status of Service:  Completed, signed off  If discussed at [place] of Stay Meetings, dates discussed:    Additional Comments:  Sharin Mons, RN 02/14/2018, 11:13 AM

## 2018-02-14 NOTE — Discharge Summary (Addendum)
Physician Discharge Summary  Stanley [name] JSH:[id number] DOB: 03-16-1976 DOA: 02/10/2018  PCP: Scot Jun, FNP  Admit date: 02/10/2018 Discharge date: 02/14/2018  Admitted From: Home Disposition:  Home  Discharge Condition:Stable CODE STATUS:FULL Diet recommendation: Heart Healthy   Brief/Interim Summary: Patient is a 42 year old male with past medical history of medical noncompliance, hypertension, CKD stage IV, diastolic heart failure with ejection fraction of 55 to 60%, current tobacco abuse/polysubstance abuse of cannabis who presented to the emergency department on 5/11 with syncopal episode and vision changes.  Blood pressure was noted to be 255/138 mmHg on presentation.  Patient was admitted under critical care service.  He was started on Cardene drip.  CT and MRI of the head was negative for any acute intracranial abnormalities.  It was reported that patient was not taking his blood pressure medications for last few days .Patient's Blood pressure gradually improved after admission.  Patient has been transferred to hospitalist service today. Patient seen and examined the bedside this morning.  His blood pressure was acceptable today.  He does not complain of any headache or visual disturbances.  His home medications have been restarted.  Patient states he has been off all medications at home.  Follow-up has been set up at [facility]. Patient is eager to go home.  He is stable for discharge.  He should follow-up with his nephrologist as an outpatient in 2 weeks.  Following problems were addressed during his hospitalization:  HTN Urgency with +vision changes  CT and MRI negative for acute, chronic changes noted  He has history of medical noncompliance.  We have restarted his  home regimen.Currently BP acceptable. Continue on Clonidine 0.2 mg 3 times daily, Norvasc 10mg  daily, labetalol 400mg  3 times daily, metoprolol XL 50 mg daily, hydralazine 100 mg 3  times daily  H/O diastolic heart failure : Currently euvolemic echocardiogram showed normal ejection fraction, severe left ventricular hypertrophy.  Chronic Kidney Stage 4 (Baseline Crt 3.8)  His renal fn was a little better 2 months ago with creat of 2.86.  Currently  3.9.  Follow-up with his nephrologist as an outpatient.  Referral made.  Urology Swelling to Testicles (Patient states has been ongoing since December).   Ultrasound shows moderate left and small right hydroceles.  Follow-up as an outpatient .No testicular pain.   Tobacco Abuse /H/O Polysubstance Abuse (Reports THC for Sleep)   Counseled for Smoking cessation Nicotine patch ordered while inpatient.    Discharge Diagnoses:  Active Problems:   Hypertensive emergency   Hypertensive urgency    Discharge Instructions  Discharge Instructions    Ambulatory referral to Nephrology   Complete by:  As directed    Follow up in 2 weeks.   Diet - low sodium heart healthy   Complete by:  As directed    Discharge instructions   Complete by:  As directed    1) Please continue taking your blood pressure medications at home . 2)Monitor your blood pressure. 3) Follow up with your PCP in a week.Do a BMP test in week to check your potassium level and kidney function. 4) Follow up with your nephrologist  as outpatient in 2 weeks.   Increase activity slowly   Complete by:  As directed      Allergies as of 02/14/2018      Reactions   Lactose Intolerance (gi) Nausea And Vomiting   Other Swelling   All beans   Nitroglycerin Anxiety, Other (See Comments)   Pain  and feels like he is on fire      Medication List    TAKE these medications   acetaminophen 500 MG tablet Commonly known as:  TYLENOL Take 1,000 mg by mouth as needed for mild pain.   albuterol 108 (90 Base) MCG/ACT inhaler Commonly known as:  PROVENTIL HFA;VENTOLIN HFA Inhale 2 puffs into the lungs every 4 (four) hours as needed for wheezing or shortness of  breath (cough, shortness of breath or wheezing.).   amLODipine 10 MG tablet Commonly known as:  NORVASC Take 1 tablet (10 mg total) by mouth daily.   aspirin EC 81 MG tablet Take 1 tablet (81 mg total) by mouth daily as needed for mild pain. What changed:  when to take this   atorvastatin 40 MG tablet Commonly known as:  LIPITOR Take 1 tablet (40 mg total) by mouth daily at 6 PM.   cloNIDine 0.2 MG tablet Commonly known as:  CATAPRES Take 1 tablet (0.2 mg total) by mouth 3 (three) times daily.   furosemide 20 MG tablet Commonly known as:  LASIX Take 1 tablet (20 mg total) by mouth daily as needed.   gemfibrozil 600 MG tablet Commonly known as:  LOPID Take 600 mg by mouth 2 (two) times daily before a meal.   hydrALAZINE 100 MG tablet Commonly known as:  APRESOLINE Take 1 tablet (100 mg total) by mouth 3 (three) times daily.   labetalol 200 MG tablet Commonly known as:  NORMODYNE Take 2 tablets (400 mg total) by mouth 3 (three) times daily.   metoprolol succinate 50 MG 24 hr tablet Commonly known as:  TOPROL-XL Take 50 mg by mouth daily. Take with or immediately following a meal.   multivitamin with minerals Tabs tablet Take 1 tablet by mouth daily as needed.   nicotine 14 mg/24hr patch Commonly known as:  NICODERM CQ - dosed in mg/24 hours Place 1 patch (14 mg total) onto the skin daily.   potassium chloride SA 20 MEQ tablet Commonly known as:  K-DUR,KLOR-CON Take 1 tablet (20 mEq total) by mouth daily.   traZODone 50 MG tablet Commonly known as:  DESYREL Take 1 tablet (50 mg total) by mouth at bedtime.      Follow-up [place]. Go on 03/01/2018.   Specialty:  Internal Medicine Why:  1pm with PCP: Molli Barrows  Contact information: [place] [address] [phone]       Donato Heinz, MD. Schedule an appointment as soon as possible for a visit in 2 week(s).   Specialty:   Nephrology Contact information: [address] [place] [address] [phone]          Allergies  Allergen Reactions  . Lactose Intolerance (Gi) Nausea And Vomiting  . Other Swelling    All beans  . Nitroglycerin Anxiety and Other (See Comments)    Pain and feels like he is on fire    Consultations:  PCCM   Procedures/Studies: Dg Chest 2 View  Result Date: 02/10/2018 CLINICAL DATA:  Witnessed syncopal episode at home, palpitations, history hypertension, stroke, stage IV chronic kidney disease, smoker EXAM: CHEST - 2 VIEW COMPARISON:  11/15/2016 FINDINGS: Borderline enlargement of cardiac silhouette with slight pulmonary vascular congestion. Mediastinal contours and pulmonary vascularity normal. Lungs clear. No pleural effusion or pneumothorax. Bones unremarkable. IMPRESSION: No acute abnormalities. Borderline enlargement of cardiac silhouette with slight pulmonary vascular congestion Electronically Signed   By: Crist Infante.D.  On: 02/10/2018 16:06   Ct Head Wo Contrast  Result Date: 02/10/2018 CLINICAL DATA:  42 year old male with severe headache and syncope today. EXAM: CT HEAD WITHOUT CONTRAST TECHNIQUE: Contiguous axial images were obtained from the base of the skull through the vertex without intravenous contrast. COMPARISON:  01/16/2018 CT and prior studies FINDINGS: Brain: No evidence of acute infarction, hemorrhage, hydrocephalus, extra-axial collection or mass lesion/mass effect. Vascular: No hyperdense vessel or unexpected calcification. Skull: Normal. Negative for fracture or focal lesion. Sinuses/Orbits: No acute finding. Other: None. IMPRESSION: Unremarkable noncontrast head CT. Electronically Signed   By: Margarette Canada M.D.   On: 02/10/2018 17:12   Ct Head Wo Contrast  Result Date: 01/16/2018 CLINICAL DATA:  Headache, acute, worst of life. Posterior headache for 2 days. EXAM: CT HEAD WITHOUT CONTRAST TECHNIQUE: Contiguous axial images were obtained from the base of  the skull through the vertex without intravenous contrast. COMPARISON:  06/12/2017 MRI. FINDINGS: Brain: No evidence of acute infarction, hemorrhage, hydrocephalus, extra-axial collection or mass lesion/mass effect. Few patchy low densities in the cerebral white matter, likely chronic microvascular ischemia given patient's history. These were present although partially obscured on comparison brain MRI which was significantly motion degraded. Vascular: No hyperdense vessel or unexpected calcification. Skull: Normal. Negative for fracture or focal lesion. Sinuses/Orbits: No acute finding. IMPRESSION: 1. No acute finding. 2. Mild cerebral white matter disease, likely chronic small vessel ischemia given patient's medical history. Electronically Signed   By: Monte Fantasia M.D.   On: 01/16/2018 10:12   Mr Angiogram Head Wo Contrast  Result Date: 02/10/2018 CLINICAL DATA:  42 year old male with severe headache and witnessed syncope. Blurred vision. EXAM: MRI HEAD WITHOUT CONTRAST MRA HEAD WITHOUT CONTRAST MRA NECK WITHOUT CONTRAST TECHNIQUE: Multiplanar, multiecho pulse sequences of the brain and surrounding structures were obtained without intravenous contrast. Angiographic images of the Circle of Willis were obtained using MRA technique without intravenous contrast. Angiographic images of the neck were obtained using MRA technique without intravenous contrast. Carotid stenosis measurements (when applicable) are obtained utilizing NASCET criteria, using the distal internal carotid diameter as the denominator. COMPARISON:  Head CT without contrast 1635 hours today, and earlier. Prior brain MRI and intracranial MRA 06/12/2017. FINDINGS: MRI HEAD FINDINGS Brain: No restricted diffusion to suggest acute infarction. No midline shift, mass effect, evidence of mass lesion, ventriculomegaly, extra-axial collection or acute intracranial hemorrhage. Cervicomedullary junction and pituitary are within normal limits. Widely  scattered small foci of mostly subcortical cerebral white matter T2 and FLAIR hyperintensity. The extent is moderate to severe for age. The configuration is nonspecific. No associated cortical encephalomalacia or chronic cerebral blood products. However, there is evidence of chronic microhemorrhage in the right pons and several pontine T2 hyperintense foci which resembles small chronic lacune is a (series 14001, image 9). The cerebellum and deep gray matter nuclei appear normal. Vascular: Major intracranial vascular flow voids are stable. Skull and upper cervical spine: Negative visible cervical spine. Normal bone marrow signal. Sinuses/Orbits: Normal orbits soft tissues. Paranasal sinuses are well pneumatized today. Other: The mastoid air cells are clear. Visible internal auditory structures appear normal. Scalp and face soft tissues appear negative. MRA NECK FINDINGS Noncontrast time-of-flight MRA imaging demonstrates antegrade flow in the bilateral carotid and vertebral arteries throughout the neck and to the skull base. The carotid bifurcations and proximal ICAs have a normal time-of-flight appearance. The left vertebral artery is slightly dominant. No arterial stenosis identified in the neck. MRA HEAD FINDINGS Antegrade flow in the posterior circulation with mildly dominant  distal left vertebral artery. Patent PICA origins. No distal vertebral stenosis. Patent basilar artery and AICA origins. There is mild irregularity in the distal basilar without significant stenosis. The SCA and PCA origins appear normal. There is moderate irregularity and stenosis in the right PCA P1 segment (series [id number], image 5). The remaining bilateral PCA branches appear normal. Posterior communicating arteries are diminutive or absent. Antegrade flow in both ICA siphons. Suggestion of vessel irregularity at both carotid anterior genu, but no definite siphon stenosis. Normal ophthalmic artery origins. Patent carotid termini. The left  ACA A1 segment is dominant and the right is diminutive. The left A1 appears mildly irregular. The anterior communicating artery is normal. The right ACA A2 segment is mildly to moderately irregular in 2 different places, with moderate distal A2 stenosis (series [id number], image 2). The bilateral MCA M1 segments are patent and mildly tortuous without irregularity or stenosis. Both MCA bifurcations are patent. There is mild irregularity in the right MCA posterior most M2 branch. No convincing left MCA branch irregularity. IMPRESSION: 1. No acute intracranial abnormality, but advanced signal abnormality in the subcortical cerebral white matter. These white matter changes are nonspecific with top differential considerations of accelerated/hereditary small vessel ischemia and sequelae of vasculitis or hypercoagulable state. 2. Abnormal signal also in the pons and this most resembles chronic small vessel ischemia, including presence of a chronic micro-hemorrhage. 3. Negative noncontrast neck MRA, but multifocal large and medium size vessel irregularity on the intracranial MRA. This includes the basilar tip, right PCA P1, right ACA A2, and right MCA M2. This appearance somewhat favors advanced for age atherosclerosis over intracranial vasculitis. Electronically Signed   By: Genevie Ann M.D.   On: 02/10/2018 19:37   Mr Jodene Nam Neck Wo Contrast  Result Date: 02/10/2018 CLINICAL DATA:  42 year old male with severe headache and witnessed syncope. Blurred vision. EXAM: MRI HEAD WITHOUT CONTRAST MRA HEAD WITHOUT CONTRAST MRA NECK WITHOUT CONTRAST TECHNIQUE: Multiplanar, multiecho pulse sequences of the brain and surrounding structures were obtained without intravenous contrast. Angiographic images of the Circle of Willis were obtained using MRA technique without intravenous contrast. Angiographic images of the neck were obtained using MRA technique without intravenous contrast. Carotid stenosis measurements (when applicable) are  obtained utilizing NASCET criteria, using the distal internal carotid diameter as the denominator. COMPARISON:  Head CT without contrast 1635 hours today, and earlier. Prior brain MRI and intracranial MRA 06/12/2017. FINDINGS: MRI HEAD FINDINGS Brain: No restricted diffusion to suggest acute infarction. No midline shift, mass effect, evidence of mass lesion, ventriculomegaly, extra-axial collection or acute intracranial hemorrhage. Cervicomedullary junction and pituitary are within normal limits. Widely scattered small foci of mostly subcortical cerebral white matter T2 and FLAIR hyperintensity. The extent is moderate to severe for age. The configuration is nonspecific. No associated cortical encephalomalacia or chronic cerebral blood products. However, there is evidence of chronic microhemorrhage in the right pons and several pontine T2 hyperintense foci which resembles small chronic lacune is a (series 14001, image 9). The cerebellum and deep gray matter nuclei appear normal. Vascular: Major intracranial vascular flow voids are stable. Skull and upper cervical spine: Negative visible cervical spine. Normal bone marrow signal. Sinuses/Orbits: Normal orbits soft tissues. Paranasal sinuses are well pneumatized today. Other: The mastoid air cells are clear. Visible internal auditory structures appear normal. Scalp and face soft tissues appear negative. MRA NECK FINDINGS Noncontrast time-of-flight MRA imaging demonstrates antegrade flow in the bilateral carotid and vertebral arteries throughout the neck and to the skull base. The carotid bifurcations  and proximal ICAs have a normal time-of-flight appearance. The left vertebral artery is slightly dominant. No arterial stenosis identified in the neck. MRA HEAD FINDINGS Antegrade flow in the posterior circulation with mildly dominant distal left vertebral artery. Patent PICA origins. No distal vertebral stenosis. Patent basilar artery and AICA origins. There is mild  irregularity in the distal basilar without significant stenosis. The SCA and PCA origins appear normal. There is moderate irregularity and stenosis in the right PCA P1 segment (series [id number], image 5). The remaining bilateral PCA branches appear normal. Posterior communicating arteries are diminutive or absent. Antegrade flow in both ICA siphons. Suggestion of vessel irregularity at both carotid anterior genu, but no definite siphon stenosis. Normal ophthalmic artery origins. Patent carotid termini. The left ACA A1 segment is dominant and the right is diminutive. The left A1 appears mildly irregular. The anterior communicating artery is normal. The right ACA A2 segment is mildly to moderately irregular in 2 different places, with moderate distal A2 stenosis (series [id number], image 2). The bilateral MCA M1 segments are patent and mildly tortuous without irregularity or stenosis. Both MCA bifurcations are patent. There is mild irregularity in the right MCA posterior most M2 branch. No convincing left MCA branch irregularity. IMPRESSION: 1. No acute intracranial abnormality, but advanced signal abnormality in the subcortical cerebral white matter. These white matter changes are nonspecific with top differential considerations of accelerated/hereditary small vessel ischemia and sequelae of vasculitis or hypercoagulable state. 2. Abnormal signal also in the pons and this most resembles chronic small vessel ischemia, including presence of a chronic micro-hemorrhage. 3. Negative noncontrast neck MRA, but multifocal large and medium size vessel irregularity on the intracranial MRA. This includes the basilar tip, right PCA P1, right ACA A2, and right MCA M2. This appearance somewhat favors advanced for age atherosclerosis over intracranial vasculitis. Electronically Signed   By: Genevie Ann M.D.   On: 02/10/2018 19:37   Mr Brain Wo Contrast  Result Date: 02/10/2018 CLINICAL DATA:  42 year old male with severe headache and  witnessed syncope. Blurred vision. EXAM: MRI HEAD WITHOUT CONTRAST MRA HEAD WITHOUT CONTRAST MRA NECK WITHOUT CONTRAST TECHNIQUE: Multiplanar, multiecho pulse sequences of the brain and surrounding structures were obtained without intravenous contrast. Angiographic images of the Circle of Willis were obtained using MRA technique without intravenous contrast. Angiographic images of the neck were obtained using MRA technique without intravenous contrast. Carotid stenosis measurements (when applicable) are obtained utilizing NASCET criteria, using the distal internal carotid diameter as the denominator. COMPARISON:  Head CT without contrast 1635 hours today, and earlier. Prior brain MRI and intracranial MRA 06/12/2017. FINDINGS: MRI HEAD FINDINGS Brain: No restricted diffusion to suggest acute infarction. No midline shift, mass effect, evidence of mass lesion, ventriculomegaly, extra-axial collection or acute intracranial hemorrhage. Cervicomedullary junction and pituitary are within normal limits. Widely scattered small foci of mostly subcortical cerebral white matter T2 and FLAIR hyperintensity. The extent is moderate to severe for age. The configuration is nonspecific. No associated cortical encephalomalacia or chronic cerebral blood products. However, there is evidence of chronic microhemorrhage in the right pons and several pontine T2 hyperintense foci which resembles small chronic lacune is a (series 14001, image 9). The cerebellum and deep gray matter nuclei appear normal. Vascular: Major intracranial vascular flow voids are stable. Skull and upper cervical spine: Negative visible cervical spine. Normal bone marrow signal. Sinuses/Orbits: Normal orbits soft tissues. Paranasal sinuses are well pneumatized today. Other: The mastoid air cells are clear. Visible internal auditory structures appear normal. Scalp and  face soft tissues appear negative. MRA NECK FINDINGS Noncontrast time-of-flight MRA imaging  demonstrates antegrade flow in the bilateral carotid and vertebral arteries throughout the neck and to the skull base. The carotid bifurcations and proximal ICAs have a normal time-of-flight appearance. The left vertebral artery is slightly dominant. No arterial stenosis identified in the neck. MRA HEAD FINDINGS Antegrade flow in the posterior circulation with mildly dominant distal left vertebral artery. Patent PICA origins. No distal vertebral stenosis. Patent basilar artery and AICA origins. There is mild irregularity in the distal basilar without significant stenosis. The SCA and PCA origins appear normal. There is moderate irregularity and stenosis in the right PCA P1 segment (series [id number], image 5). The remaining bilateral PCA branches appear normal. Posterior communicating arteries are diminutive or absent. Antegrade flow in both ICA siphons. Suggestion of vessel irregularity at both carotid anterior genu, but no definite siphon stenosis. Normal ophthalmic artery origins. Patent carotid termini. The left ACA A1 segment is dominant and the right is diminutive. The left A1 appears mildly irregular. The anterior communicating artery is normal. The right ACA A2 segment is mildly to moderately irregular in 2 different places, with moderate distal A2 stenosis (series [id number], image 2). The bilateral MCA M1 segments are patent and mildly tortuous without irregularity or stenosis. Both MCA bifurcations are patent. There is mild irregularity in the right MCA posterior most M2 branch. No convincing left MCA branch irregularity. IMPRESSION: 1. No acute intracranial abnormality, but advanced signal abnormality in the subcortical cerebral white matter. These white matter changes are nonspecific with top differential considerations of accelerated/hereditary small vessel ischemia and sequelae of vasculitis or hypercoagulable state. 2. Abnormal signal also in the pons and this most resembles chronic small vessel ischemia,  including presence of a chronic micro-hemorrhage. 3. Negative noncontrast neck MRA, but multifocal large and medium size vessel irregularity on the intracranial MRA. This includes the basilar tip, right PCA P1, right ACA A2, and right MCA M2. This appearance somewhat favors advanced for age atherosclerosis over intracranial vasculitis. Electronically Signed   By: Genevie Ann M.D.   On: 02/10/2018 19:37   US Scrotum  Result Date: 02/10/2018 CLINICAL DATA:  LEFT testicular swelling for 1-1/2 years. EXAM: SCROTAL ULTRASOUND DOPPLER ULTRASOUND OF THE TESTICLES TECHNIQUE: Complete ultrasound examination of the testicles, epididymis, and other scrotal structures was performed. Color and spectral Doppler ultrasound were also utilized to evaluate blood flow to the testicles. COMPARISON:  Scrotal ultrasound August 24, 2015 FINDINGS: Right testicle Measurements: 4 x 1.4 x 2.3 cm. No mass or microlithiasis visualized. Left testicle Measurements: 5 x 2.5 x 2.2 cm. No mass or microlithiasis visualized. Right epididymis:  Nonacute.  11 mm anechoic simple cyst. Left epididymis:  Nonacute. 17 mm anechoic simple cyst. Hydrocele: Small RIGHT and large LEFT hydroceles with minimal echogenic debris. Pulsed Doppler interrogation of both testes demonstrates normal low resistance arterial and venous waveforms bilaterally. 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 No acute scrotal process. Electronically Signed   By: Elon Alas M.D.   On: 02/10/2018 22:22   US Scrotum Doppler  Result Date: 02/10/2018 CLINICAL DATA:  Left testicular swelling for 1.5 years EXAM: SCROTAL ULTRASOUND DOPPLER ULTRASOUND OF THE TESTICLES TECHNIQUE: Complete ultrasound examination of the testicles, epididymis, and other scrotal structures was performed. Color and spectral Doppler ultrasound were also utilized to evaluate blood flow to the testicles. COMPARISON:  None. FINDINGS: Right testicle Measurements: 4 x 1.4 x 2.3 cm. No mass  or microlithiasis visualized. Left testicle Measurements: 5  x 2.5 x 2.2 cm. No mass or microlithiasis visualized. Right epididymis: Simple 1 x 0.4 x 1.1 cm epididymal cyst no the head. Left epididymis:  1.7 x 0.7 x 0.8 cm epididymal cyst along body. Hydrocele: Moderate to large left and small right hydroceles, complex on the left due to internal debris. Varicocele:  None visualized. Pulsed Doppler interrogation of both testes demonstrates normal low resistance arterial and venous waveforms bilaterally. IMPRESSION: 1. No evidence of testicular torsion or mass. 2. Bilateral epididymal cysts. 3. Moderate to large left hydrocele with small right hydrocele that may have accounted for the left-sided testicular swelling. Electronically Signed   By: Ashley Royalty M.D.   On: 02/10/2018 22:38       Subjective: Patient seen and examined at bedside this morning.  Remains comfortable.  Denies any headache or visual problems.  Is stable for discharge to home today.  Discharge Exam: Vitals:   02/14/18 0800 02/14/18 1000  BP: (!) 164/99 (!) 154/94  Pulse: (!) 58 60  Resp:    Temp: 98 F (36.7 C)   SpO2:     Vitals:   02/13/18 2232 02/14/18 0419 02/14/18 0800 02/14/18 1000  BP: (!) 162/101 (!) 165/96 (!) 164/99 (!) 154/94  Pulse:  (!) 58 (!) 58 60  Resp:  16    Temp:  98.7 F (37.1 C) 98 F (36.7 C)   TempSrc:  Oral Oral   SpO2:  95%    Weight:  88.6 kg (195 lb 4.8 oz)    Height:  5\' 8"  (1.727 m)      General: Pt is alert, awake, not in acute distress Cardiovascular: RRR, S1/S2 +, no rubs, no gallops Respiratory: CTA bilaterally, no wheezing, no rhonchi Abdominal: Soft, NT, ND, bowel sounds + Extremities: no edema, no cyanosis    The results of significant diagnostics from this hospitalization (including imaging, microbiology, ancillary and laboratory) are listed below for reference.     Microbiology: Recent Results (from the past 240 hour(s))  Urine culture     Status: None   Collection  Time: 02/10/18  4:52 PM  Result Value Ref Range Status   Specimen Description URINE, CLEAN CATCH  Final   Special Requests NONE  Final   Culture   Final    NO GROWTH Performed at [facility], [address] [place], [place] [address]    Report Status 02/11/2018 FINAL  Final  MRSA PCR Screening     Status: None   Collection Time: 02/10/18 10:13 PM  Result Value Ref Range Status   MRSA by PCR NEGATIVE NEGATIVE Final    Comment:        The GeneXpert MRSA Assay (FDA approved for NASAL specimens only), is one component of a comprehensive MRSA colonization surveillance program. It is not intended to diagnose MRSA infection nor to guide or monitor treatment for MRSA infections. Performed at [facility], [place] [address] [place], [place] [address]      Labs: BNP (last 3 results) Recent Labs    11/24/17 1606 02/10/18 1527 02/12/18 0312  BNP 805.0* 585.3* 563.8*   Basic Metabolic Panel: Recent Labs  Lab 02/10/18 1527 02/10/18 1544 02/11/18 0646 02/12/18 0312  NA 140  --  140 140  K 3.6  --  3.5 3.3*  CL 105  --  106 106  CO2 24  --  23 24  GLUCOSE 88  --  83 89  BUN 24*  --  21* 24*  CREATININE  3.18*  --  3.11* 3.19*  CALCIUM 9.1  --  8.9 8.7*  MG  --  2.2 2.0  --   PHOS  --   --  4.5  --    Liver Function Tests: No results for input(s): AST, ALT, ALKPHOS, BILITOT, PROT, ALBUMIN in the last 168 hours. No results for input(s): LIPASE, AMYLASE in the last 168 hours. No results for input(s): AMMONIA in the last 168 hours. CBC: Recent Labs  Lab 02/10/18 1527 02/11/18 0646 02/12/18 0312  WBC 5.1 4.6 4.1  HGB 12.3* 12.2* 11.9*  HCT 37.8* 37.9* 36.6*  MCV 84.8 84.8 85.1  PLT 313 330 290   Cardiac Enzymes: Recent Labs  Lab 02/11/18 0016 02/11/18 0646 02/11/18 1153  TROPONINI 0.07* 0.06* 0.04*   BNP: Invalid input(s): POCBNP CBG: Recent Labs  Lab 02/10/18 1654 02/11/18 0916  GLUCAP 85 83   D-Dimer No results for input(s): DDIMER in  the last 72 hours. Hgb A1c No results for input(s): HGBA1C in the last 72 hours. Lipid Profile No results for input(s): CHOL, HDL, LDLCALC, TRIG, CHOLHDL, LDLDIRECT in the last 72 hours. Thyroid function studies No results for input(s): TSH, T4TOTAL, T3FREE, THYROIDAB in the last 72 hours.  Invalid input(s): FREET3 Anemia work up No results for input(s): VITAMINB12, FOLATE, FERRITIN, TIBC, IRON, RETICCTPCT in the last 72 hours. Urinalysis    Component Value Date/Time   COLORURINE STRAW (A) 02/10/2018 1652   APPEARANCEUR CLEAR 02/10/2018 1652   LABSPEC 1.009 02/10/2018 1652   PHURINE 6.0 02/10/2018 1652   GLUCOSEU NEGATIVE 02/10/2018 1652   HGBUR NEGATIVE 02/10/2018 1652   BILIRUBINUR NEGATIVE 02/10/2018 1652   KETONESUR NEGATIVE 02/10/2018 1652   PROTEINUR 100 (A) 02/10/2018 1652   UROBILINOGEN 0.2 11/24/2017 1509   NITRITE NEGATIVE 02/10/2018 1652   LEUKOCYTESUR NEGATIVE 02/10/2018 1652   Sepsis Labs Invalid input(s): PROCALCITONIN,  WBC,  LACTICIDVEN Microbiology Recent Results (from the past 240 hour(s))  Urine culture     Status: None   Collection Time: 02/10/18  4:52 PM  Result Value Ref Range Status   Specimen Description URINE, CLEAN CATCH  Final   Special Requests NONE  Final   Culture   Final    NO GROWTH Performed at [facility], [place] [address], [place], [place] [address]    Report Status 02/11/2018 FINAL  Final  MRSA PCR Screening     Status: None   Collection Time: 02/10/18 10:13 PM  Result Value Ref Range Status   MRSA by PCR NEGATIVE NEGATIVE Final    Comment:        The GeneXpert MRSA Assay (FDA approved for NASAL specimens only), is one component of a comprehensive MRSA colonization surveillance program. It is not intended to diagnose MRSA infection nor to guide or monitor treatment for MRSA infections. Performed at [facility], [place] [address], [place],  [address]     Please note: You were cared for by a hospitalist  during your hospital stay. Once you are discharged, your primary care physician will handle any further medical issues. Please note that NO REFILLS for any discharge medications will be authorized once you are discharged, as it is imperative that you return to your primary care physician (or establish a relationship with a primary care physician if you do not have one) for your post hospital discharge needs so that they can reassess your need for medications and monitor your lab values.    Time coordinating discharge: 40 minutes  SIGNED:   Shelly Coss, MD  [facility] 02/14/2018, 11:46 AM Pager [phone]  If 7PM-7AM, please contact night-coverage [email or web address] Password [email or web address]

## 2018-02-14 NOTE — Progress Notes (Signed)
Pt given d/c instructions and iv removed intact.  Waiting on transportation home.

## 2018-02-19 MED FILL — hydrALAZINE HCL 100 MG TABS: 100 | 30 days supply | Qty: 90 | Fill #0

## 2018-02-19 MED FILL — ?CLONIDINE HCL 0.2 MG TABLE: 0.2 | 30 days supply | Qty: 90 | Fill #1

## 2018-03-01 ENCOUNTER — Ambulatory Visit (INDEPENDENT_AMBULATORY_CARE_PROVIDER_SITE_OTHER): Payer: Self-pay | Admitting: Family Medicine

## 2018-03-01 ENCOUNTER — Encounter: Payer: Self-pay | Admitting: Family Medicine

## 2018-03-01 VITALS — BP 184/110 | HR 76 | Temp 98.4°F | Ht 68.0 in | Wt 198.4 lb

## 2018-03-01 DIAGNOSIS — F17200 Nicotine dependence, unspecified, uncomplicated: Secondary | ICD-10-CM

## 2018-03-01 DIAGNOSIS — I10 Essential (primary) hypertension: Secondary | ICD-10-CM

## 2018-03-01 DIAGNOSIS — F419 Anxiety disorder, unspecified: Secondary | ICD-10-CM

## 2018-03-01 DIAGNOSIS — Z09 Encounter for follow-up examination after completed treatment for conditions other than malignant neoplasm: Secondary | ICD-10-CM

## 2018-03-01 DIAGNOSIS — Z131 Encounter for screening for diabetes mellitus: Secondary | ICD-10-CM

## 2018-03-01 DIAGNOSIS — N184 Chronic kidney disease, stage 4 (severe): Secondary | ICD-10-CM

## 2018-03-01 LAB — POCT URINALYSIS DIPSTICK
Bilirubin, UA: NEGATIVE
Blood, UA: NEGATIVE
Glucose, UA: NEGATIVE
Ketones, UA: NEGATIVE
Leukocytes, UA: NEGATIVE
Nitrite, UA: NEGATIVE
Protein, UA: POSITIVE — AB
Spec Grav, UA: 1.02 (ref 1.010–1.025)
Urobilinogen, UA: 0.2 E.U./dL
pH, UA: 5 (ref 5.0–8.0)

## 2018-03-01 LAB — POCT GLYCOSYLATED HEMOGLOBIN (HGB A1C): Hemoglobin A1C: 4.9 % (ref 4.0–5.6)

## 2018-03-01 MED ORDER — CLONIDINE HCL 0.1 MG PO TABS
0.1000 mg | ORAL_TABLET | Freq: Once | ORAL | Status: AC
  Administered 2018-03-01: 0.1 mg via ORAL

## 2018-03-01 NOTE — Patient Instructions (Signed)
Heart-Healthy Eating Plan Heart-healthy meal planning includes:  Limiting unhealthy fats.  Increasing healthy fats.  Making other small dietary changes.  You may need to talk with your doctor or a diet specialist (dietitian) to create an eating plan that is right for you. What types of fat should I choose?  Choose healthy fats. These include olive oil and canola oil, flaxseeds, walnuts, almonds, and seeds.  Eat more omega-3 fats. These include salmon, mackerel, sardines, tuna, flaxseed oil, and ground flaxseeds. Try to eat fish at least twice each week.  Limit saturated fats. ? Saturated fats are often found in animal products, such as meats, butter, and cream. ? Plant sources of saturated fats include palm oil, palm kernel oil, and coconut oil.  Avoid foods with partially hydrogenated oils in them. These include stick margarine, some tub margarines, cookies, crackers, and other baked goods. These contain trans fats. What general guidelines do I need to follow?  Check food labels carefully. Identify foods with trans fats or high amounts of saturated fat.  Fill one half of your plate with vegetables and green salads. Eat 4-5 servings of vegetables per day. A serving of vegetables is: ? 1 cup of raw leafy vegetables. ?  cup of raw or cooked cut-up vegetables. ?  cup of vegetable juice.  Fill one fourth of your plate with whole grains. Look for the word "whole" as the first word in the ingredient list.  Fill one fourth of your plate with lean protein foods.  Eat 4-5 servings of fruit per day. A serving of fruit is: ? One medium whole fruit. ?  cup of dried fruit. ?  cup of fresh, frozen, or canned fruit. ?  cup of 100% fruit juice.  Eat more foods that contain soluble fiber. These include apples, broccoli, carrots, beans, peas, and barley. Try to get 20-30 g of fiber per day.  Eat more home-cooked food. Eat less restaurant, buffet, and fast food.  Limit or avoid  alcohol.  Limit foods high in starch and sugar.  Avoid fried foods.  Avoid frying your food. Try baking, boiling, grilling, or broiling it instead. You can also reduce fat by: ? Removing the skin from poultry. ? Removing all visible fats from meats. ? Skimming the fat off of stews, soups, and gravies before serving them. ? Steaming vegetables in water or broth.  Lose weight if you are overweight.  Eat 4-5 servings of nuts, legumes, and seeds per week: ? One serving of dried beans or legumes equals  cup after being cooked. ? One serving of nuts equals 1 ounces. ? One serving of seeds equals  ounce or one tablespoon.  You may need to keep track of how much salt or sodium you eat. This is especially true if you have high blood pressure. Talk with your doctor or dietitian to get more information. What foods can I eat? Grains Breads, including French, white, pita, wheat, raisin, rye, oatmeal, and Italian. Tortillas that are neither fried nor made with lard or trans fat. Low-fat rolls, including hotdog and hamburger buns and English muffins. Biscuits. Muffins. Waffles. Pancakes. Light popcorn. Whole-grain cereals. Flatbread. Melba toast. Pretzels. Breadsticks. Rusks. Low-fat snacks. Low-fat crackers, including oyster, saltine, matzo, graham, animal, and rye. Rice and pasta, including brown rice and pastas that are made with whole wheat. Vegetables All vegetables. Fruits All fruits, but limit coconut. Meats and Other Protein Sources Lean, well-trimmed beef, veal, pork, and lamb. Chicken and turkey without skin. All fish and shellfish.   Wild duck, rabbit, pheasant, and venison. Egg whites or low-cholesterol egg substitutes. Dried beans, peas, lentils, and tofu. Seeds and most nuts. Dairy Low-fat or nonfat cheeses, including ricotta, string, and mozzarella. Skim or 1% milk that is liquid, powdered, or evaporated. Buttermilk that is made with low-fat milk. Nonfat or low-fat  yogurt. Beverages Mineral water. Diet carbonated beverages. Sweets and Desserts Sherbets and fruit ices. Honey, jam, marmalade, jelly, and syrups. Meringues and gelatins. Pure sugar candy, such as hard candy, jelly beans, gumdrops, mints, marshmallows, and small amounts of dark chocolate. [employer] Eat all sweets and desserts in moderation. Fats and Oils Nonhydrogenated (trans-free) margarines. Vegetable oils, including soybean, sesame, sunflower, olive, peanut, safflower, corn, canola, and cottonseed. Salad dressings or mayonnaise made with a vegetable oil. Limit added fats and oils that you use for cooking, baking, salads, and as spreads. Other Cocoa powder. Coffee and tea. All seasonings and condiments. The items listed above may not be a complete list of recommended foods or beverages. Contact your dietitian for more options. What foods are not recommended? Grains Breads that are made with saturated or trans fats, oils, or whole milk. Croissants. Butter rolls. Cheese breads. Sweet rolls. Donuts. Buttered popcorn. Chow mein noodles. High-fat crackers, such as cheese or butter crackers. Meats and Other Protein Sources Fatty meats, such as hotdogs, short ribs, sausage, spareribs, bacon, rib eye roast or steak, and mutton. High-fat deli meats, such as salami and bologna. Caviar. Domestic duck and goose. Organ meats, such as kidney, liver, sweetbreads, and heart. Dairy Cream, sour cream, cream cheese, and creamed cottage cheese. Whole-milk cheeses, including blue (bleu), [place], [place], [place], American, [place], Swiss, cheddar, [place], and [place]. Whole or 2% milk that is liquid, evaporated, or condensed. Whole buttermilk. Cream sauce or high-fat cheese sauce. Yogurt that is made from whole milk. Beverages Regular sodas and juice drinks with added sugar. Sweets and Desserts Frosting. Pudding. Cookies. Cakes other than angel food cake. Candy that has milk chocolate or white  chocolate, hydrogenated fat, butter, coconut, or unknown ingredients. Buttered syrups. Full-fat ice cream or ice cream drinks. Fats and Oils Gravy that has suet, meat fat, or shortening. Cocoa butter, hydrogenated oils, palm oil, coconut oil, palm kernel oil. These can often be found in baked products, candy, fried foods, nondairy creamers, and whipped toppings. Solid fats and shortenings, including bacon fat, salt pork, lard, and butter. Nondairy cream substitutes, such as coffee creamers and sour cream substitutes. Salad dressings that are made of unknown oils, cheese, or sour cream. The items listed above may not be a complete list of foods and beverages to avoid. Contact your dietitian for more information. This information is not intended to replace advice given to you by your health care provider. Make sure you discuss any questions you have with your health care provider. Document Released: 03/20/2012 Document Revised: 02/25/2016 Document Reviewed: 03/13/2014 Elsevier Interactive Patient Education  2018 [place] [employer] with less salt is one way to reduce the amount of sodium you get from food. Depending on your condition and overall health, your health care provider or diet and nutrition specialist (dietitian) may recommend that you reduce your sodium intake. Most people should have less than 2,300 milligrams (mg) of sodium each day. If you have high blood pressure (hypertension), you may need to limit your sodium to 1,500 mg each day. Follow the tips below to help reduce your sodium intake. What do I need to know about cooking with less  salt? Shopping  Buy sodium-free or low-sodium products. Look for the following words on food labels: ? Low-sodium. ? Sodium-free. ? Reduced-sodium. ? No salt added. ? Unsalted.  Buy fresh or frozen vegetables. Avoid canned vegetables.  Avoid buying meats or protein foods that have been injected with broth or saline  solution.  Avoid cured or smoked meats, such as hot dogs, bacon, salami, ham, and bologna. Reading food labels  Check the food label before buying or using packaged ingredients.  Look for products with no more than 140 mg of sodium in one serving.  Do not choose foods with salt as one of the first three ingredients on the ingredients list. If salt is one of the first three ingredients, it usually means the item is high in sodium, because ingredients are listed in order of amount in the food item. Cooking  Use herbs, seasonings without salt, and spices as substitutes for salt in foods.  Use sodium-free baking soda when baking.  Grill, braise, or roast foods to add flavor with less salt.  Avoid adding salt to pasta, rice, or hot cereals while cooking.  Drain and rinse canned vegetables before use.  Avoid adding salt when cooking sweets and desserts.  Cook with low-sodium ingredients. What are some salt alternatives? The following are herbs, seasonings, and spices that can be used instead of salt to give taste to your food. Herbs should be fresh or dried. Do not choose packaged mixes. Next to the name of the herb, spice, or seasoning are some examples of foods you can pair it with. Herbs  Bay leaves - Soups, meat and vegetable dishes, and spaghetti sauce.  Basil - [employer], soups, pasta, and fish dishes.  Cilantro - Meat, poultry, and vegetable dishes.  Chili powder - Marinades and Mexican dishes.  Chives - Salad dressings and potato dishes.  Cumin - Mexican dishes, couscous, and meat dishes.  Dill - Fish dishes, sauces, and salads.  Fennel - Meat and vegetable dishes, breads, and cookies.  Garlic (do not use garlic salt) - [place] dishes, meat dishes, salad dressings, and sauces.  Marjoram - Soups, potato dishes, and meat dishes.  Oregano - Pizza and spaghetti sauce.  Parsley - Salads, soups, pasta, and meat dishes.  Rosemary - [place] dishes, salad dressings,  soups, and red meats.  Saffron - Fish dishes, pasta, and some poultry dishes.  Sage - Stuffings and sauces.  Tarragon - Fish and [employer].  Thyme - Stuffing, meat, and fish dishes. Seasonings  Lemon juice - Fish dishes, poultry dishes, vegetables, and salads.  Vinegar - Salad dressings, vegetables, and fish dishes. Spices  Cinnamon - Sweet dishes, such as cakes, cookies, and puddings.  Cloves - Gingerbread, puddings, and marinades for meats.  Curry - Vegetable dishes, fish and poultry dishes, and stir-fry dishes.  Ginger - Vegetables dishes, fish dishes, and stir-fry dishes.  Nutmeg - Pasta, vegetables, poultry, fish dishes, and custard. What are some low-sodium ingredients and foods?  Fresh or frozen fruits and vegetables with no sauce added.  Fresh or frozen whole meats, poultry, and fish with no sauce added.  Eggs.  Noodles, pasta, quinoa, rice.  Shredded or puffed wheat or puffed rice.  Regular or quick oats.  Milk, yogurt, hard cheeses, and low-sodium cheeses. Good cheese choices include Swiss, [place]. Always check the label for the serving size and sodium content.  Unsalted butter or margarine.  Unsalted nuts.  Sherbet or ice cream (keep to  cup per serving).  Homemade pudding.  Sodium-free baking soda and baking powder. This is not a complete list of low-sodium ingredients and foods. Contact your dietitian for more options. Summary  Cooking with less salt is one way to reduce the amount of sodium that you get from food.  Buy sodium-free or low-sodium products.  Check the food label before using or buying packaged ingredients.  Use herbs, seasonings without salt, and spices as substitutes for salt in foods. This information is not intended to replace advice given to you by your health care provider. Make sure you discuss any questions you have with your health care provider. Document Released: 09/19/2005 Document  Revised: 09/27/2016 Document Reviewed: 09/27/2016 Elsevier Interactive Patient Education  2017 [place] DASH stands for "Dietary Approaches to Stop Hypertension." The DASH eating plan is a healthy eating plan that has been shown to reduce high blood pressure (hypertension). It may also reduce your risk for type 2 diabetes, heart disease, and stroke. The DASH eating plan may also help with weight loss. What are tips for following this plan? General guidelines  Avoid eating more than 2,300 mg (milligrams) of salt (sodium) a day. If you have hypertension, you may need to reduce your sodium intake to 1,500 mg a day.  Limit alcohol intake to no more than 1 drink a day for nonpregnant women and 2 drinks a day for men. One drink equals 12 oz of beer, 5 oz of wine, or 1 oz of hard liquor.  Work with your health care provider to maintain a healthy body weight or to lose weight. Ask what an ideal weight is for you.  Get at least 30 minutes of exercise that causes your heart to beat faster (aerobic exercise) most days of the week. Activities may include walking, swimming, or biking.  Work with your health care provider or diet and nutrition specialist (dietitian) to adjust your eating plan to your individual calorie needs. Reading food labels  Check food labels for the amount of sodium per serving. Choose foods with less than 5 percent of the Daily Value of sodium. Generally, foods with less than 300 mg of sodium per serving fit into this eating plan.  To find whole grains, look for the word "whole" as the first word in the ingredient list. Shopping  Buy products labeled as "low-sodium" or "no salt added."  Buy fresh foods. Avoid canned foods and premade or frozen meals. Cooking  Avoid adding salt when cooking. Use salt-free seasonings or herbs instead of table salt or sea salt. Check with your health care provider or pharmacist before using salt substitutes.  Do not  fry foods. Cook foods using healthy methods such as baking, boiling, grilling, and broiling instead.  Cook with heart-healthy oils, such as olive, canola, soybean, or sunflower oil. Meal planning   Eat a balanced diet that includes: ? 5 or more servings of fruits and vegetables each day. At each meal, try to fill half of your plate with fruits and vegetables. ? Up to 6-8 servings of whole grains each day. ? Less than 6 oz of lean meat, poultry, or fish each day. A 3-oz serving of meat is about the same size as a deck of cards. One egg equals 1 oz. ? 2 servings of low-fat dairy each day. ? A serving of nuts, seeds, or beans 5 times each week. ? Heart-healthy fats. Healthy fats called Omega-3 fatty acids are found in foods such as flaxseeds and coldwater fish,  like sardines, salmon, and mackerel.  Limit how much you eat of the following: ? Canned or prepackaged foods. ? Food that is high in trans fat, such as fried foods. ? Food that is high in saturated fat, such as fatty meat. ? Sweets, desserts, sugary drinks, and other foods with added sugar. ? Full-fat dairy products.  Do not salt foods before eating.  Try to eat at least 2 vegetarian meals each week.  Eat more home-cooked food and less restaurant, buffet, and fast food.  When eating at a restaurant, ask that your food be prepared with less salt or no salt, if possible. What foods are recommended? The items listed may not be a complete list. Talk with your dietitian about what dietary choices are best for you. Grains Whole-grain or whole-wheat bread. Whole-grain or whole-wheat pasta. Brown rice. [place]. Bulgur. Whole-grain and low-sodium cereals. Pita bread. Low-fat, low-sodium crackers. Whole-wheat flour tortillas. Vegetables Fresh or frozen vegetables (raw, steamed, roasted, or grilled). Low-sodium or reduced-sodium tomato and vegetable juice. Low-sodium or reduced-sodium tomato sauce and tomato paste. Low-sodium or  reduced-sodium canned vegetables. Fruits All fresh, dried, or frozen fruit. Canned fruit in natural juice (without added sugar). Meat and other protein foods Skinless chicken or [place]. Ground chicken or [place]. Pork with fat trimmed off. Fish and seafood. Egg whites. Dried beans, peas, or lentils. Unsalted nuts, nut butters, and seeds. Unsalted canned beans. Lean cuts of beef with fat trimmed off. Low-sodium, lean deli meat. Dairy Low-fat (1%) or fat-free (skim) milk. Fat-free, low-fat, or reduced-fat cheeses. Nonfat, low-sodium ricotta or cottage cheese. Low-fat or nonfat yogurt. Low-fat, low-sodium cheese. Fats and oils Soft margarine without trans fats. Vegetable oil. Low-fat, reduced-fat, or light mayonnaise and salad dressings (reduced-sodium). Canola, safflower, olive, soybean, and sunflower oils. Avocado. Seasoning and other foods Herbs. Spices. Seasoning mixes without salt. Unsalted popcorn and pretzels. Fat-free sweets. What foods are not recommended? The items listed may not be a complete list. Talk with your dietitian about what dietary choices are best for you. Grains Baked goods made with fat, such as croissants, muffins, or some breads. Dry pasta or rice meal packs. Vegetables Creamed or fried vegetables. Vegetables in a cheese sauce. Regular canned vegetables (not low-sodium or reduced-sodium). Regular canned tomato sauce and paste (not low-sodium or reduced-sodium). Regular tomato and vegetable juice (not low-sodium or reduced-sodium). Angie Fava. Olives. Fruits Canned fruit in a light or heavy syrup. Fried fruit. Fruit in cream or butter sauce. Meat and other protein foods Fatty cuts of meat. Ribs. Fried meat. Berniece Salines. Sausage. Bologna and other processed lunch meats. Salami. Fatback. Hotdogs. Bratwurst. Salted nuts and seeds. Canned beans with added salt. Canned or smoked fish. Whole eggs or egg yolks. Chicken or [place] with skin. Dairy Whole or 2% milk, cream, and half-and-half.  Whole or full-fat cream cheese. Whole-fat or sweetened yogurt. Full-fat cheese. Nondairy creamers. Whipped toppings. Processed cheese and cheese spreads. Fats and oils Butter. Stick margarine. Lard. Shortening. Ghee. Bacon fat. Tropical oils, such as coconut, palm kernel, or palm oil. Seasoning and other foods Salted popcorn and pretzels. Onion salt, garlic salt, seasoned salt, table salt, and sea salt. Worcestershire sauce. Tartar sauce. Barbecue sauce. Teriyaki sauce. Soy sauce, including reduced-sodium. Steak sauce. Canned and packaged gravies. Fish sauce. Oyster sauce. Cocktail sauce. Horseradish that you find on the shelf. Ketchup. Mustard. Meat flavorings and tenderizers. Bouillon cubes. Hot sauce and Tabasco sauce. Premade or packaged marinades. Premade or packaged taco seasonings. Relishes. Regular salad dressings. Where to find more information:  National Heart, Lung, and Blood Institute: https://wilson-eaton.com/  American Heart Association: www.heart.org Summary  The DASH eating plan is a healthy eating plan that has been shown to reduce high blood pressure (hypertension). It may also reduce your risk for type 2 diabetes, heart disease, and stroke.  With the DASH eating plan, you should limit salt (sodium) intake to 2,300 mg a day. If you have hypertension, you may need to reduce your sodium intake to 1,500 mg a day.  When on the DASH eating plan, aim to eat more fresh fruits and vegetables, whole grains, lean proteins, low-fat dairy, and heart-healthy fats.  Work with your health care provider or diet and nutrition specialist (dietitian) to adjust your eating plan to your individual calorie needs. This information is not intended to replace advice given to you by your health care provider. Make sure you discuss any questions you have with your health care provider. Document Released: 09/08/2011 Document Revised: 09/12/2016 Document Reviewed: 09/12/2016 Elsevier Interactive Patient Education   2018 [employer].   Fat and Cholesterol Restricted Diet Getting too much fat and cholesterol in your diet may cause health problems. Following this diet helps keep your fat and cholesterol at normal levels. This can keep you from getting sick. What types of fat should I choose?  Choose monosaturated and polyunsaturated fats. These are found in foods such as olive oil, canola oil, flaxseeds, walnuts, almonds, and seeds.  Eat more omega-3 fats. Good choices include salmon, mackerel, sardines, tuna, flaxseed oil, and ground flaxseeds.  Limit saturated fats. These are in animal products such as meats, butter, and cream. They can also be in plant products such as palm oil, palm kernel oil, and coconut oil.  Avoid foods with partially hydrogenated oils in them. These contain trans fats. Examples of foods that have trans fats are stick margarine, some tub margarines, cookies, crackers, and other baked goods. What general guidelines do I need to follow?  Check food labels. Look for the words "trans fat" and "saturated fat."  When preparing a meal: ? Fill half of your plate with vegetables and green salads. ? Fill one fourth of your plate with whole grains. Look for the word "whole" as the first word in the ingredient list. ? Fill one fourth of your plate with lean protein foods.  Eat more foods that have fiber, like apples, carrots, beans, peas, and barley.  Eat more home-cooked foods. Eat less at restaurants and buffets.  Limit or avoid alcohol.  Limit foods high in starch and sugar.  Limit fried foods.  Cook foods without frying them. Baking, boiling, grilling, and broiling are all great options.  Lose weight if you are overweight. Losing even a small amount of weight can help your overall health. It can also help prevent diseases such as diabetes and heart disease. What foods can I eat? Grains Whole grains, such as whole wheat or whole grain breads, crackers, cereals, and pasta.  Unsweetened oatmeal, bulgur, barley, quinoa, or brown rice. Corn or whole wheat flour tortillas. Vegetables Fresh or frozen vegetables (raw, steamed, roasted, or grilled). Green salads. Fruits All fresh, canned (in natural juice), or frozen fruits. Meat and Other Protein Products Ground beef (85% or leaner), grass-fed beef, or beef trimmed of fat. Skinless chicken or [place]. Ground chicken or [place]. Pork trimmed of fat. All fish and seafood. Eggs. Dried beans, peas, or lentils. Unsalted nuts or seeds. Unsalted canned or dry beans. Dairy Low-fat dairy products, such as skim or 1% milk, 2% or  reduced-fat cheeses, low-fat ricotta or cottage cheese, or plain low-fat yogurt. Fats and Oils Tub margarines without trans fats. Light or reduced-fat mayonnaise and salad dressings. Avocado. Olive, canola, sesame, or safflower oils. Natural peanut or almond butter (choose ones without added sugar and oil). The items listed above may not be a complete list of recommended foods or beverages. Contact your dietitian for more options. What foods are not recommended? Grains White bread. White pasta. White rice. Cornbread. Bagels, pastries, and croissants. Crackers that contain trans fat. Vegetables White potatoes. Corn. Creamed or fried vegetables. Vegetables in a cheese sauce. Fruits Dried fruits. Canned fruit in light or heavy syrup. Fruit juice. Meat and Other Protein Products Fatty cuts of meat. Ribs, chicken wings, bacon, sausage, bologna, salami, chitterlings, fatback, hot dogs, bratwurst, and packaged luncheon meats. Liver and organ meats. Dairy Whole or 2% milk, cream, half-and-half, and cream cheese. Whole milk cheeses. Whole-fat or sweetened yogurt. Full-fat cheeses. Nondairy creamers and whipped toppings. Processed cheese, cheese spreads, or cheese curds. Sweets and Desserts Corn syrup, sugars, honey, and molasses. Candy. Jam and jelly. Syrup. Sweetened cereals. Cookies, pies, cakes, donuts,  muffins, and ice cream. Fats and Oils Butter, stick margarine, lard, shortening, ghee, or bacon fat. Coconut, palm kernel, or palm oils. Beverages Alcohol. Sweetened drinks (such as sodas, lemonade, and fruit drinks or punches). The items listed above may not be a complete list of foods and beverages to avoid. Contact your dietitian for more information. This information is not intended to replace advice given to you by your health care provider. Make sure you discuss any questions you have with your health care provider. Document Released: 03/20/2012 Document Revised: 05/26/2016 Document Reviewed: 12/19/2013 Elsevier Interactive Patient Education  [employer].

## 2018-03-01 NOTE — Progress Notes (Signed)
Subjective:     Patient ID: Stanley [name], male   DOB: 09/13/1976, 42 y.o.   MRN: [id number]   PCP: Kathe Becton, NP  Chief Complaint  Patient presents with  . Follow-up    HTN    HPI  Stanley [name] has a history of Stroke, Hypertension, Hyperlipidemia, Depression, Anxiety, and Tobacco Use. He is here today for follow up on Hypertension.   Current Status: Since his last office visit, he states that he has been having increased anxiety. He has not been taking his blood pressure medications. He has had multiple visits to the ED, on 01/16/2018 and 02/10/2018 for Headaches and Hypertensive Emergency. He was admitted on 02/10/2018 for Hypertensive Crisis and discharge on 02/14/2018. He denies visual changes, dizziness, unsteadiness, and falls. Denies chest pain, heart palpitations, cough and shortness of breath.   He has been having increased anxiety because of family problems, health issues, and relationship issues.   Denies abdominal pain, nausea, vomiting, diarrhea, constipation, and blood in stools.   He denies pain today.   He is accompanied by his brother today.    Past Medical History:  Diagnosis Date  . Anxiety   . CKD (chronic kidney disease), stage IV ([place])   . Depression   . Dyslexia   . High cholesterol   . Hypertension   . Stroke ([place]) 06/03/2017   "minor; completely recovered today" (06/13/2017)  . Tobacco abuse     Family History  Problem Relation Age of Onset  . Hypertension Mother   . Hypertension Brother   . Diabetes type II Brother     Social History   Socioeconomic History  . Marital status: Married    Spouse name: Not on file  . Number of children: Not on file  . Years of education: Not on file  . Highest education level: Not on file  Occupational History  . Not on file  Social Needs  . Financial resource strain: Not on file  . Food insecurity:    Worry: Not on file    Inability: Not on file  . Transportation needs:    Medical: Not on file   Non-medical: Not on file  Tobacco Use  . Smoking status: Current Every Day Smoker    Packs/day: 1.00    Years: 30.00    Pack years: 30.00    Types: Cigarettes  . Smokeless tobacco: Never Used  Substance and Sexual Activity  . Alcohol use: No  . Drug use: Yes    Types: Marijuana    Comment: 06/13/2017 "not often"  . Sexual activity: Not Currently  Lifestyle  . Physical activity:    Days per week: Not on file    Minutes per session: Not on file  . Stress: Not on file  Relationships  . Social connections:    Talks on phone: Not on file    Gets together: Not on file    Attends religious service: Not on file    Active member of club or organization: Not on file    Attends meetings of clubs or organizations: Not on file    Relationship status: Not on file  . Intimate partner violence:    Fear of current or ex partner: Not on file    Emotionally abused: Not on file    Physically abused: Not on file    Forced sexual activity: Not on file  Other Topics Concern  . Not on file  Social History Narrative  . Not on file  Past Surgical History:  Procedure Laterality Date  . NO PAST SURGERIES     Immunization History  Administered Date(s) Administered  . Influenza,inj,Quad PF,6+ Mos 09/28/2016  . Pneumococcal Polysaccharide-23 06/15/2017    Current Meds  Medication Sig  . acetaminophen (TYLENOL) 500 MG tablet Take 1,000 mg by mouth as needed for mild pain.  Marland Kitchen albuterol (PROVENTIL HFA;VENTOLIN HFA) 108 (90 Base) MCG/ACT inhaler Inhale 2 puffs into the lungs every 4 (four) hours as needed for wheezing or shortness of breath (cough, shortness of breath or wheezing.).  Marland Kitchen amLODipine (NORVASC) 10 MG tablet Take 1 tablet (10 mg total) by mouth daily.  Marland Kitchen aspirin EC 81 MG tablet Take 1 tablet (81 mg total) by mouth daily as needed for mild pain. (Patient taking differently: Take 81 mg by mouth 2 (two) times daily as needed for mild pain. )  . atorvastatin (LIPITOR) 40 MG tablet Take 1  tablet (40 mg total) by mouth daily at 6 PM.  . cloNIDine (CATAPRES) 0.2 MG tablet Take 1 tablet (0.2 mg total) by mouth 3 (three) times daily.  . furosemide (LASIX) 20 MG tablet Take 1 tablet (20 mg total) by mouth daily as needed.  Marland Kitchen gemfibrozil (LOPID) 600 MG tablet Take 600 mg by mouth 2 (two) times daily before a meal.  . hydrALAZINE (APRESOLINE) 100 MG tablet Take 1 tablet (100 mg total) by mouth 3 (three) times daily.  Marland Kitchen labetalol (NORMODYNE) 200 MG tablet Take 2 tablets (400 mg total) by mouth 3 (three) times daily.  . metoprolol succinate (TOPROL-XL) 50 MG 24 hr tablet Take 50 mg by mouth daily. Take with or immediately following a meal.  . Multiple Vitamin (MULTIVITAMIN WITH MINERALS) TABS tablet Take 1 tablet by mouth daily as needed.  . potassium chloride SA (K-DUR,KLOR-CON) 20 MEQ tablet Take 1 tablet (20 mEq total) by mouth daily.  . traZODone (DESYREL) 50 MG tablet Take 1 tablet (50 mg total) by mouth at bedtime.    Allergies  Allergen Reactions  . Lactose Intolerance (Gi) Nausea And Vomiting  . Other Swelling    All beans  . Nitroglycerin Anxiety and Other (See Comments)    Pain and feels like he is on fire    BP (!) 184/110 (BP Location: Left Arm, Patient Position: Sitting, Cuff Size: Large)   Pulse 76   Temp 98.4 F (36.9 C) (Oral)   Ht 5\' 8"  (1.727 m)   Wt 198 lb 6.4 oz (90 kg)   SpO2 98%   BMI 30.17 kg/m    Review of Systems  Constitutional: Negative.   HENT: Negative.   Eyes: Negative.   Respiratory: Negative.   Cardiovascular: Positive for chest pain (occasional ).  Gastrointestinal: Negative.   Endocrine: Negative.   Genitourinary: Negative.   Musculoskeletal: Negative.   Skin: Negative.   Allergic/Immunologic: Negative.   Neurological: Negative.   Hematological: Negative.   Psychiatric/Behavioral: The patient is nervous/anxious.    Objective:   Physical Exam  Constitutional: He is oriented to person, place, and time. He appears well-developed  and well-nourished.  HENT:  Head: Normocephalic and atraumatic.  Right Ear: External ear normal.  Left Ear: External ear normal.  Nose: Nose normal.  Mouth/Throat: Oropharynx is clear and moist.  Eyes: Pupils are equal, round, and reactive to light. Conjunctivae and EOM are normal.  Neck: Normal range of motion. Neck supple.  Cardiovascular: Normal rate, regular rhythm, normal heart sounds and intact distal pulses.  Pulmonary/Chest: Effort normal and breath sounds normal.  Abdominal: Soft. Bowel sounds are normal. There is tenderness (mild tenderness in lower-mid abdomen).  Musculoskeletal: Normal range of motion.  Neurological: He is alert and oriented to person, place, and time.  Skin: Skin is warm and dry. Capillary refill takes less than 2 seconds.  Psychiatric: He has a normal mood and affect. His behavior is normal. Judgment and thought content normal.  Nursing note and vitals reviewed.  Assessment:   1. Accelerated hypertension 2. CKD (chronic kidney disease) stage 4, GFR 15-29 ml/min (HCC) 3. Screening for diabetes mellitus 4. Smoker 5. Anxiety 6. Follow up  Plan:   1. Accelerated hypertension Blood pressure is 185/110 today. We will give him Clonidine 0.1 mg X 2 doses today while in office. Second BP reading was 170/100. His last blood pressure reading was 150s/80's   He is encouraged to take all prescribed blood pressure medications daily as directed. He is cautioned not to miss any doses of blood pressure medications. He has agreed to be compliant with all blood pressure medications.   He is to continue with a Heart Healthy diet and at least 30 minutes of cardio exercise daily.   Written material given to patient for DASH diet, Low-salt, and Heart Healthy meal planning.   For now, we will follow him with weekly BP checks.   - POCT urinalysis dipstick - CBC with Differential - Comprehensive metabolic panel - cloNIDine (CATAPRES) tablet 0.1 mg - Urinalysis  Dipstick - Microalbumin/Creatinine Ratio, Urine  2. CKD (chronic kidney disease) stage 4, GFR 15-29 ml/min (HCC) Creatinine level was 3.19 on 02/12/2018. Will repeat CMP today.  - Lipid Panel - TSH - HgB A1c  3. Screening for diabetes mellitus We will assess Hgb A1c today.   4. Smoker He continues to decrease smoking on his own. He has smoked 1/2 pack in 2 weeks. Unable to afford Nicotine Patches at this time. We will continue to encourage and monitor his progress.   5. Anxiety Stable today. He will continue Trazodone as directed.   6. Follow up Follow up in 1 week for blood pressure check.   Meds ordered this encounter  Medications  . cloNIDine (CATAPRES) tablet 0.1 mg    Kathe Becton,  MSN, FNP-BC Patient [facility] [address] [place], [place] [address] [phone]

## 2018-03-02 ENCOUNTER — Telehealth: Payer: Self-pay

## 2018-03-02 LAB — CBC WITH DIFFERENTIAL/PLATELET
Basophils Absolute: 0.1 10*3/uL (ref 0.0–0.2)
Basos: 2 %
EOS (ABSOLUTE): 0.2 10*3/uL (ref 0.0–0.4)
Eos: 4 %
Hematocrit: 38 % (ref 37.5–51.0)
Hemoglobin: 12.4 g/dL — ABNORMAL LOW (ref 13.0–17.7)
Immature Grans (Abs): 0 10*3/uL (ref 0.0–0.1)
Immature Granulocytes: 0 %
Lymphocytes Absolute: 1.2 10*3/uL (ref 0.7–3.1)
Lymphs: 31 %
MCH: 27.4 pg (ref 26.6–33.0)
MCHC: 32.6 g/dL (ref 31.5–35.7)
MCV: 84 fL (ref 79–97)
Monocytes Absolute: 0.4 10*3/uL (ref 0.1–0.9)
Monocytes: 9 %
Neutrophils Absolute: 2.2 10*3/uL (ref 1.4–7.0)
Neutrophils: 54 %
Platelets: 289 10*3/uL (ref 150–450)
RBC: 4.53 x10E6/uL (ref 4.14–5.80)
RDW: 16.6 % — ABNORMAL HIGH (ref 12.3–15.4)
WBC: 4 10*3/uL (ref 3.4–10.8)

## 2018-03-02 LAB — LIPID PANEL
Chol/HDL Ratio: 2.4 ratio (ref 0.0–5.0)
Cholesterol, Total: 96 mg/dL — ABNORMAL LOW (ref 100–199)
HDL: 40 mg/dL (ref >39)
LDL Calculated: 44 mg/dL (ref 0–99)
Triglycerides: 62 mg/dL (ref 0–149)
VLDL Cholesterol Cal: 12 mg/dL (ref 5–40)

## 2018-03-02 LAB — COMPREHENSIVE METABOLIC PANEL
ALT: 10 IU/L (ref 0–44)
AST: 13 IU/L (ref 0–40)
Albumin/Globulin Ratio: 1.6 (ref 1.2–2.2)
Albumin: 4.5 g/dL (ref 3.5–5.5)
Alkaline Phosphatase: 92 IU/L (ref 39–117)
BUN/Creatinine Ratio: 8 — ABNORMAL LOW (ref 9–20)
BUN: 27 mg/dL — ABNORMAL HIGH (ref 6–24)
Bilirubin Total: 0.3 mg/dL (ref 0.0–1.2)
CO2: 21 mmol/L (ref 20–29)
Calcium: 9.3 mg/dL (ref 8.7–10.2)
Chloride: 102 mmol/L (ref 96–106)
Creatinine, Ser: 3.42 mg/dL — ABNORMAL HIGH (ref 0.76–1.27)
GFR calc Af Amer: 25 mL/min/{1.73_m2} — ABNORMAL LOW (ref >59)
GFR calc non Af Amer: 21 mL/min/{1.73_m2} — ABNORMAL LOW (ref >59)
Globulin, Total: 2.8 g/dL (ref 1.5–4.5)
Glucose: 79 mg/dL (ref 65–99)
Potassium: 4.4 mmol/L (ref 3.5–5.2)
Sodium: 138 mmol/L (ref 134–144)
Total Protein: 7.3 g/dL (ref 6.0–8.5)

## 2018-03-02 LAB — TSH: TSH: 1.31 u[IU]/mL (ref 0.450–4.500)

## 2018-03-02 LAB — MICROALBUMIN / CREATININE URINE RATIO
Creatinine, Urine: 159.5 mg/dL
Microalb/Creat Ratio: 870.1 mg/g creat — ABNORMAL HIGH (ref 0.0–30.0)
Microalbumin, Urine: 1387.8 ug/mL

## 2018-03-02 NOTE — Telephone Encounter (Signed)
-----   Message from [name], [place] sent at 03/02/2018  8:27 AM EDT ----- Regarding: "Lab Results" Morey Hummingbird,   Please inform Mr. Kihara that his labs remain good.    Thanks!  Natalie.

## 2018-03-02 NOTE — Telephone Encounter (Signed)
Patient notified of results.

## 2018-03-06 MED FILL — traZODone HCL 50 MG TABS: 50 | 30 days supply | Qty: 30 | Fill #3

## 2018-03-06 MED FILL — AMLODIPINE BESYLATE 10 MG T: 10 | 30 days supply | Qty: 30 | Fill #1

## 2018-03-06 MED FILL — POTASSIUM CL ER 20 MEQ TAB: 20 | 30 days supply | Qty: 30 | Fill #3

## 2018-03-06 MED FILL — FUROSEMIDE 20 MG TABLET: 20 | 30 days supply | Qty: 30 | Fill #3

## 2018-03-06 NOTE — Telephone Encounter (Signed)
-----   Message from [name], [place] sent at 03/05/2018  9:17 PM EDT ----- Regarding: 'Lab Results" Stanley [name] labs are stable and unchanged.   He is to monitor his blood pressures at home and keep appointment for follow up on 03/07/2018.   Thanks Morey Hummingbird!   Natalie.

## 2018-03-06 NOTE — Telephone Encounter (Signed)
Patient notified

## 2018-03-07 ENCOUNTER — Ambulatory Visit (INDEPENDENT_AMBULATORY_CARE_PROVIDER_SITE_OTHER): Payer: Self-pay | Admitting: Family Medicine

## 2018-03-07 VITALS — BP 200/120 | HR 86

## 2018-03-07 DIAGNOSIS — R7989 Other specified abnormal findings of blood chemistry: Secondary | ICD-10-CM

## 2018-03-07 DIAGNOSIS — I169 Hypertensive crisis, unspecified: Secondary | ICD-10-CM

## 2018-03-07 DIAGNOSIS — I10 Essential (primary) hypertension: Secondary | ICD-10-CM

## 2018-03-07 DIAGNOSIS — Z0130 Encounter for examination of blood pressure without abnormal findings: Secondary | ICD-10-CM

## 2018-03-07 DIAGNOSIS — Z09 Encounter for follow-up examination after completed treatment for conditions other than malignant neoplasm: Secondary | ICD-10-CM

## 2018-03-07 MED ORDER — CLONIDINE HCL 0.1 MG PO TABS
0.1000 mg | ORAL_TABLET | Freq: Once | ORAL | Status: AC
  Administered 2018-03-07: 0.1 mg via ORAL

## 2018-03-07 NOTE — Progress Notes (Addendum)
Subjective:    Patient ID: Stanley [name], male    DOB: 1976/03/20, 42 y.o.   MRN: [id number]   PCP: Stanley Becton, NP  Chief Complaint  Patient presents with  . Blood Pressure Check    HPI  Stanley [name] has a most recent history of multiple Hospital Admission for Hypertensive Emergency. He is here today for blood pressure check.   Current Status: Prior to his last office visit on 03/01/2018, Stanley [name] had discontinued all medications. He states that he has been taking all blood pressure medications consistently for 2  weeks now. He is accompanied today by his mother.   He denies dizziness, confusion, headaches, vision changes, unsteadiness, lightheadednes, and falls. He denies chest pain, cough, heart palpitations, and shortness of breath. He denies nausea and vomiting today.  Past Medical History:  Diagnosis Date  . Anxiety   . CKD (chronic kidney disease), stage IV ([place])   . Depression   . Dyslexia   . High cholesterol   . Hypertension   . Stroke ([place]) 06/03/2017   "minor; completely recovered today" (06/13/2017)  . Tobacco abuse     Family History  Problem Relation Age of Onset  . Hypertension Mother   . Hypertension Brother   . Diabetes type II Brother     Social History   Socioeconomic History  . Marital status: Married    Spouse name: Not on file  . Number of children: Not on file  . Years of education: Not on file  . Highest education level: Not on file  Occupational History  . Not on file  Social Needs  . Financial resource strain: Not on file  . Food insecurity:    Worry: Not on file    Inability: Not on file  . Transportation needs:    Medical: Not on file    Non-medical: Not on file  Tobacco Use  . Smoking status: Current Every Day Smoker    Packs/day: 1.00    Years: 30.00    Pack years: 30.00    Types: Cigarettes  . Smokeless tobacco: Never Used  Substance and Sexual Activity  . Alcohol use: No  . Drug use: Yes    Types: Marijuana    Comment:  06/13/2017 "not often"  . Sexual activity: Not Currently  Lifestyle  . Physical activity:    Days per week: Not on file    Minutes per session: Not on file  . Stress: Not on file  Relationships  . Social connections:    Talks on phone: Not on file    Gets together: Not on file    Attends religious service: Not on file    Active member of club or organization: Not on file    Attends meetings of clubs or organizations: Not on file    Relationship status: Not on file  . Intimate partner violence:    Fear of current or ex partner: Not on file    Emotionally abused: Not on file    Physically abused: Not on file    Forced sexual activity: Not on file  Other Topics Concern  . Not on file  Social History Narrative  . Not on file    Past Surgical History:  Procedure Laterality Date  . NO PAST SURGERIES     Immunization History  Administered Date(s) Administered  . Influenza,inj,Quad PF,6+ Mos 09/28/2016  . Pneumococcal Polysaccharide-23 06/15/2017   Current Meds  Medication Sig  . acetaminophen (TYLENOL) 500 MG tablet Take  1,000 mg by mouth as needed for mild pain.  Marland Kitchen albuterol (PROVENTIL HFA;VENTOLIN HFA) 108 (90 Base) MCG/ACT inhaler Inhale 2 puffs into the lungs every 4 (four) hours as needed for wheezing or shortness of breath (cough, shortness of breath or wheezing.).  Marland Kitchen amLODipine (NORVASC) 10 MG tablet Take 1 tablet (10 mg total) by mouth daily.  Marland Kitchen aspirin EC 81 MG tablet Take 1 tablet (81 mg total) by mouth daily as needed for mild pain. (Patient taking differently: Take 81 mg by mouth 2 (two) times daily as needed for mild pain. )  . atorvastatin (LIPITOR) 40 MG tablet Take 1 tablet (40 mg total) by mouth daily at 6 PM.  . furosemide (LASIX) 20 MG tablet Take 1 tablet (20 mg total) by mouth daily as needed.  Marland Kitchen gemfibrozil (LOPID) 600 MG tablet Take 600 mg by mouth 2 (two) times daily before a meal.  . hydrALAZINE (APRESOLINE) 100 MG tablet Take 1 tablet (100 mg total) by  mouth 3 (three) times daily.  Marland Kitchen labetalol (NORMODYNE) 200 MG tablet Take 2 tablets (400 mg total) by mouth 3 (three) times daily.  . metoprolol succinate (TOPROL-XL) 50 MG 24 hr tablet Take 50 mg by mouth daily. Take with or immediately following a meal.  . Multiple Vitamin (MULTIVITAMIN WITH MINERALS) TABS tablet Take 1 tablet by mouth daily as needed.  . nicotine (NICODERM CQ - DOSED IN MG/24 HOURS) 14 mg/24hr patch Place 1 patch (14 mg total) onto the skin daily.  . potassium chloride SA (K-DUR,KLOR-CON) 20 MEQ tablet Take 1 tablet (20 mEq total) by mouth daily.  . traZODone (DESYREL) 50 MG tablet Take 1 tablet (50 mg total) by mouth at bedtime.  . [DISCONTINUED] cloNIDine (CATAPRES) 0.2 MG tablet Take 1 tablet (0.2 mg total) by mouth 3 (three) times daily.   Allergies  Allergen Reactions  . Lactose Intolerance (Gi) Nausea And Vomiting  . Other Swelling    All beans  . Nitroglycerin Anxiety and Other (See Comments)    Pain and feels like he is on fire    BP (!) 200/120   Pulse 86   SpO2 98%     Review of Systems  Constitutional: Negative.   HENT: Negative.   Eyes: Negative.   Respiratory: Negative.   Cardiovascular: Negative.   Gastrointestinal: Negative.   Endocrine: Negative.   Genitourinary: Negative.   Musculoskeletal: Negative.   Skin: Negative.   Allergic/Immunologic: Negative.   Neurological: Negative.   Hematological: Negative.   Psychiatric/Behavioral: Negative.    Objective:   Physical Exam  Constitutional: He is oriented to person, place, and time. He appears well-developed and well-nourished.  HENT:  Head: Normocephalic and atraumatic.  Right Ear: External ear normal.  Left Ear: External ear normal.  Nose: Nose normal.  Mouth/Throat: Oropharynx is clear and moist.  Eyes: Pupils are equal, round, and reactive to light. Conjunctivae and EOM are normal.  Neck: Normal range of motion. Neck supple.  Cardiovascular: Normal rate, regular rhythm, normal heart  sounds and intact distal pulses.  Pulmonary/Chest: Effort normal and breath sounds normal.  Abdominal: Soft. Bowel sounds are normal.  Musculoskeletal: Normal range of motion.  Neurological: He is alert and oriented to person, place, and time.  Skin: Skin is warm and dry. Capillary refill takes less than 2 seconds.  Psychiatric: He has a normal mood and affect. His behavior is normal. Judgment and thought content normal.  Nursing note and vitals reviewed.  Assessment & Plan:   1. Blood pressure  check Returned today for blood pressure check because of elevated blood pressures on 03/01/2018 of 184/110, which decreased to  170/100 after/80's a 1st dose of Clonidine 0.1 mg. Blood pressure reading was decreased to 150s/80s after 2nd dose of Clonidine 0.1mg  on 03/01/2018. He was instructed to report back to office today (03/09/2018) blood pressure readings.    2. Hypertensive crisis Blood pressure readings today continue to be elevated.  After dose of Clonidine 0.1 mg, atient told to lie on left side, then re-assessed after 30 minutes. Blood pressure decreased to 180/90.   After consultation with Cardiologist, we will initiate him on Clonidine 0.3 mg TID. He will follow up with Nephrologist on Monday, 03/12/2018 for further evaluation of renal status.   Patient was also, once again referred to ED for further evaluation. Patient refused to report to ED, stating that he would get his family to monitor him. He stated that he would call EMS if symptoms began once he was at home. Patient was counseled on the possible complications of increased blood pressures such as ischemic and hemorrhagic stroke. He stated that he would continue to take medications and follow up with office on Friday, 03/16/2018.  - cloNIDine (CATAPRES) tablet 0.1 mg -  Rx for cloNIDine (CATAPRES) tablet 0.3 mg, 3 times daily.  3. Elevated creatinine  Creatinine ws 3.42, and Microalbumin/Creatinine Ratio was 870.1 on 03/01/2018. He has  follow up appointment with Nephrologist on 03/12/2018.   4. Follow up He will follow up on Friday 03/16/2018 for blood pressure check.    Meds ordered this encounter  Medications  . cloNIDine (CATAPRES) tablet 0.1 mg  . cloNIDine (CATAPRES) tablet 0.1 mg  . cloNIDine (CATAPRES) 0.3 MG tablet    Sig: Take 1 tablet (0.3 mg total) by mouth 3 (three) times daily.    Dispense:  90 tablet    Refill:  [place],  MSN, FNP-BC Patient [facility] [address] [place], [place] [address] [phone]

## 2018-03-09 ENCOUNTER — Ambulatory Visit: Payer: Self-pay | Admitting: Family Medicine

## 2018-03-09 VITALS — BP 188/90 | HR 75

## 2018-03-09 DIAGNOSIS — I10 Essential (primary) hypertension: Secondary | ICD-10-CM

## 2018-03-09 DIAGNOSIS — I159 Secondary hypertension, unspecified: Secondary | ICD-10-CM

## 2018-03-09 DIAGNOSIS — I160 Hypertensive urgency: Secondary | ICD-10-CM

## 2018-03-09 MED ORDER — CLONIDINE HCL 0.1 MG PO TABS
0.1000 mg | ORAL_TABLET | Freq: Once | ORAL | Status: AC
  Administered 2018-03-09: 0.1 mg via ORAL

## 2018-03-11 MED ORDER — CLONIDINE HCL 0.3 MG PO TABS: 0.3000 mg | ORAL_TABLET | Freq: Three times a day (TID) | ORAL | 1 refills | Status: DC

## 2018-03-11 NOTE — Addendum Note (Signed)
Addended by: [name] on: 03/11/2018 10:14 AM   Modules accepted: Orders

## 2018-03-11 NOTE — Progress Notes (Signed)
Subjective:    Patient ID: Stanley [name], male    DOB: Aug 10, 1976, 42 y.o.   MRN: [id number]   PCP: Kathe Becton, NP  Chief Complaint  Patient presents with  . blood pressure check    HPI  [name] has a most recent history of multiple Hospital Admission for Hypertensive Emergency. He is here today for blood pressure check.   Current Status: Prior to his last office visit on 03/01/2018, [name] had discontinued all medications. He states that he has been taking all blood pressure medications consistently for 2  weeks now. He is accompanied today by his mother.   He denies dizziness, confusion, headaches, vision changes, unsteadiness, lightheadednes, and falls. He denies chest pain, cough, heart palpitations, and shortness of breath. He denies nausea and vomiting today.  Past Medical History:  Diagnosis Date  . Anxiety   . CKD (chronic kidney disease), stage IV ([place])   . Depression   . Dyslexia   . High cholesterol   . Hypertension   . Stroke ([place]) 06/03/2017   "minor; completely recovered today" (06/13/2017)  . Tobacco abuse     Family History  Problem Relation Age of Onset  . Hypertension Mother   . Hypertension Brother   . Diabetes type II Brother     Social History   Socioeconomic History  . Marital status: Married    Spouse name: Not on file  . Number of children: Not on file  . Years of education: Not on file  . Highest education level: Not on file  Occupational History  . Not on file  Social Needs  . Financial resource strain: Not on file  . Food insecurity:    Worry: Not on file    Inability: Not on file  . Transportation needs:    Medical: Not on file    Non-medical: Not on file  Tobacco Use  . Smoking status: Current Every Day Smoker    Packs/day: 1.00    Years: 30.00    Pack years: 30.00    Types: Cigarettes  . Smokeless tobacco: Never Used  Substance and Sexual Activity  . Alcohol use: No  . Drug use: Yes    Types: Marijuana    Comment:  06/13/2017 "not often"  . Sexual activity: Not Currently  Lifestyle  . Physical activity:    Days per week: Not on file    Minutes per session: Not on file  . Stress: Not on file  Relationships  . Social connections:    Talks on phone: Not on file    Gets together: Not on file    Attends religious service: Not on file    Active member of club or organization: Not on file    Attends meetings of clubs or organizations: Not on file    Relationship status: Not on file  . Intimate partner violence:    Fear of current or ex partner: Not on file    Emotionally abused: Not on file    Physically abused: Not on file    Forced sexual activity: Not on file  Other Topics Concern  . Not on file  Social History Narrative  . Not on file    Past Surgical History:  Procedure Laterality Date  . NO PAST SURGERIES     Immunization History  Administered Date(s) Administered  . Influenza,inj,Quad PF,6+ Mos 09/28/2016  . Pneumococcal Polysaccharide-23 06/15/2017   No outpatient medications have been marked as taking for the 03/09/18 encounter (Clinical  Support) with [facility].   Allergies  Allergen Reactions  . Lactose Intolerance (Gi) Nausea And Vomiting  . Other Swelling    All beans  . Nitroglycerin Anxiety and Other (See Comments)    Pain and feels like he is on fire    BP (!) 188/90 (BP Location: Right Arm, Patient Position: Lying left side, Cuff Size: Large) Comment (Cuff Size): manual  Pulse 75   SpO2 100%     Review of Systems  Constitutional: Negative.   HENT: Negative.   Eyes: Negative.   Respiratory: Negative.   Cardiovascular: Negative.   Gastrointestinal: Negative.   Endocrine: Negative.   Genitourinary: Negative.   Musculoskeletal: Negative.   Skin: Negative.   Allergic/Immunologic: Negative.   Neurological: Negative.   Hematological: Negative.   Psychiatric/Behavioral: Negative.    Objective:   Physical Exam  Constitutional: He is oriented to person,  place, and time. He appears well-developed and well-nourished.  HENT:  Head: Normocephalic and atraumatic.  Right Ear: External ear normal.  Left Ear: External ear normal.  Nose: Nose normal.  Mouth/Throat: Oropharynx is clear and moist.  Eyes: Pupils are equal, round, and reactive to light. Conjunctivae and EOM are normal.  Neck: Normal range of motion. Neck supple.  Cardiovascular: Normal rate, regular rhythm, normal heart sounds and intact distal pulses.  Pulmonary/Chest: Effort normal and breath sounds normal.  Abdominal: Soft. Bowel sounds are normal.  Musculoskeletal: Normal range of motion.  Neurological: He is alert and oriented to person, place, and time.  Skin: Skin is warm and dry. Capillary refill takes less than 2 seconds.  Psychiatric: He has a normal mood and affect. His behavior is normal. Judgment and thought content normal.  Nursing note and vitals reviewed.  Assessment & Plan:   1. Blood pressure check Returned today for blood pressure check because of elevated blood pressures on 03/01/2018 of 184/110, which decreased to  170/100 after/80's a 1st dose of Clonidine 0.1 mg. Blood pressure reading was decreased to 150s/80s after 2nd dose of Clonidine 0.1mg  on 03/01/2018. He was instructed to report back to office today (03/09/2018) blood pressure readings.    2. Hypertensive crisis Blood pressure readings today continue to be elevated.  After dose of Clonidine 0.1 mg, atient told to lie on left side, then re-assessed after 30 minutes. Blood pressure decreased to 180/90.   After consultation with Cardiologist, we will initiate him on Clonidine 0.3 mg TID. He will follow up with Nephrologist on Monday, 03/12/2018 for further evaluation of renal status.   Patient was also, once again referred to ED for further evaluation. Patient refused to report to ED, stating that he would get his family to monitor him. He stated that he would call EMS if symptoms began once he was at home.  Patient was counseled on the possible complications of increased blood pressures such as ischemic and hemorrhagic stroke. He stated that he would continue to take medications and follow up with office on Friday, 03/16/2018.  - cloNIDine (CATAPRES) tablet 0.1 mg -  Rx for cloNIDine (CATAPRES) tablet 0.3 mg, 3 times daily.  3. Elevated creatinine  Creatinine ws 3.42, and Microalbumin/Creatinine Ratio was 870.1 on 03/01/2018. He has follow up appointment with Nephrologist on 03/12/2018.   4. Follow up He will follow up on Friday 03/16/2018 for blood pressure check.    Meds ordered this encounter  Medications  . cloNIDine (CATAPRES) tablet 0.1 mg  . cloNIDine (CATAPRES) 0.3 MG tablet    Sig: Take 1 tablet (  0.3 mg total) by mouth 3 (three) times daily.    Dispense:  90 tablet    Refill:  [place],  MSN, FNP-BC [facility] [address] [place], [place] [address] [phone]

## 2018-03-12 MED FILL — FUROSEMIDE 40 MG TAB: 40 | 30 days supply | Qty: 30 | Fill #0

## 2018-03-12 MED FILL — cloNIDine HCL 0.3 MG TABS: 0.3 | 30 days supply | Qty: 90 | Fill #0

## 2018-03-14 ENCOUNTER — Other Ambulatory Visit: Payer: Self-pay | Admitting: Family Medicine

## 2018-03-14 DIAGNOSIS — N184 Chronic kidney disease, stage 4 (severe): Secondary | ICD-10-CM

## 2018-03-14 NOTE — Progress Notes (Signed)
Sent 2nd referral to Nephrology.

## 2018-03-16 ENCOUNTER — Other Ambulatory Visit: Payer: Self-pay | Admitting: Family Medicine

## 2018-03-16 ENCOUNTER — Ambulatory Visit (INDEPENDENT_AMBULATORY_CARE_PROVIDER_SITE_OTHER): Payer: Self-pay | Admitting: Family Medicine

## 2018-03-16 VITALS — BP 186/104 | HR 94 | Ht 68.0 in

## 2018-03-16 DIAGNOSIS — I10 Essential (primary) hypertension: Secondary | ICD-10-CM

## 2018-03-16 MED ORDER — CLONIDINE HCL 0.1 MG PO TABS
0.2000 mg | ORAL_TABLET | Freq: Once | ORAL | Status: AC
  Administered 2018-03-16: 0.2 mg via ORAL

## 2018-03-16 MED ORDER — ISOSORBIDE MONONITRATE ER 30 MG PO TB24: 30.0000 mg | ORAL_TABLET | Freq: Every day | ORAL | 0 refills | Status: DC

## 2018-03-16 MED FILL — ISOSORBIDE MN ER 30 MG TAB: 30 | 15 days supply | Qty: 15 | Fill #0

## 2018-03-16 NOTE — Patient Instructions (Signed)
Isosorbide Mononitrate extended-release tablets What is this medicine? ISOSORBIDE MONONITRATE (eye soe SOR bide mon oh NYE trate) is a vasodilator. It relaxes blood vessels, increasing the blood and oxygen supply to your heart. This medicine is used to prevent chest pain caused by angina. It will not help to stop an episode of chest pain. This medicine may be used for other purposes; ask your health care provider or pharmacist if you have questions. COMMON BRAND NAME(S): Imdur, Isotrate ER What should I tell my health care provider before I take this medicine? They need to know if you have any of these conditions: -previous heart attack or heart failure -an unusual or allergic reaction to isosorbide mononitrate, nitrates, other medicines, foods, dyes, or preservatives -pregnant or trying to get pregnant -breast-feeding How should I use this medicine? Take this medicine by mouth with a glass of water. Follow the directions on the prescription label. Do not crush or chew. Take your medicine at regular intervals. Do not take your medicine more often than directed. Do not stop taking this medicine except on the advice of your doctor or health care professional. Talk to your pediatrician regarding the use of this medicine in children. Special care may be needed. Overdosage: If you think you have taken too much of this medicine contact a poison control center or emergency room at once. NOTE: This medicine is only for you. Do not share this medicine with others. What if I miss a dose? If you miss a dose, take it as soon as you can. If it is almost time for your next dose, take only that dose. Do not take double or extra doses. What may interact with this medicine? Do not take this medicine with any of the following medications: -medicines used to treat erectile dysfunction (ED) like avanafil, sildenafil, tadalafil, and vardenafil -riociguat This medicine may also interact with the following  medications: -medicines for high blood pressure -other medicines for angina or heart failure This list may not describe all possible interactions. Give your health care provider a list of all the medicines, herbs, non-prescription drugs, or dietary supplements you use. Also tell them if you smoke, drink alcohol, or use illegal drugs. Some items may interact with your medicine. What should I watch for while using this medicine? Check your heart rate and blood pressure regularly while you are taking this medicine. Ask your doctor or health care professional what your heart rate and blood pressure should be and when you should contact him or her. Tell your doctor or health care professional if you feel your medicine is no longer working. You may get dizzy. Do not drive, use machinery, or do anything that needs mental alertness until you know how this medicine affects you. To reduce the risk of dizzy or fainting spells, do not sit or stand up quickly, especially if you are an older patient. Alcohol can make you more dizzy, and increase flushing and rapid heartbeats. Avoid alcoholic drinks. Do not treat yourself for coughs, colds, or pain while you are taking this medicine without asking your doctor or health care professional for advice. Some ingredients may increase your blood pressure. What side effects may I notice from receiving this medicine? Side effects that you should report to your doctor or health care professional as soon as possible: -bluish discoloration of lips, fingernails, or palms of hands -irregular heartbeat, palpitations -low blood pressure -nausea, vomiting -persistent headache -unusually weak or tired Side effects that usually do not require medical attention (report  to your doctor or health care professional if they continue or are bothersome): -flushing of the face or neck -rash This list may not describe all possible side effects. Call your doctor for medical advice about side  effects. You may report side effects to FDA at 1-800-FDA-1088. Where should I keep my medicine? Keep out of the reach of children. Store between 15 and 30 degrees C (59 and 86 degrees F). Keep container tightly closed. Throw away any unused medicine after the expiration date. NOTE: This sheet is a summary. It may not cover all possible information. If you have questions about this medicine, talk to your doctor, pharmacist, or health care provider.  2018 Elsevier/Gold Standard (2013-07-19 14:48:19)    !!!!Patient to AVOID Viagra while taking this medication!!!!

## 2018-03-16 NOTE — Progress Notes (Deleted)
Subjective:    Patient ID: Stanley [name], male    DOB: 09-27-1976, 42 y.o.   MRN: [id number]   PCP: Kathe Becton, NP  Chief Complaint  Patient presents with  . Blood Pressure Check     HPI   Current Status:  Past Medical History:  Diagnosis Date  . Anxiety   . CKD (chronic kidney disease), stage IV ([place])   . Depression   . Dyslexia   . High cholesterol   . Hypertension   . Stroke ([facility]) 06/03/2017   "minor; completely recovered today" (06/13/2017)  . Tobacco abuse     Family History  Problem Relation Age of Onset  . Hypertension Mother   . Hypertension Brother   . Diabetes type II Brother     Social History   Socioeconomic History  . Marital status: Married    Spouse name: Not on file  . Number of children: Not on file  . Years of education: Not on file  . Highest education level: Not on file  Occupational History  . Not on file  Social Needs  . Financial resource strain: Not on file  . Food insecurity:    Worry: Not on file    Inability: Not on file  . Transportation needs:    Medical: Not on file    Non-medical: Not on file  Tobacco Use  . Smoking status: Current Every Day Smoker    Packs/day: 1.00    Years: 30.00    Pack years: 30.00    Types: Cigarettes  . Smokeless tobacco: Never Used  Substance and Sexual Activity  . Alcohol use: No  . Drug use: Yes    Types: Marijuana    Comment: 06/13/2017 "not often"  . Sexual activity: Not Currently  Lifestyle  . Physical activity:    Days per week: Not on file    Minutes per session: Not on file  . Stress: Not on file  Relationships  . Social connections:    Talks on phone: Not on file    Gets together: Not on file    Attends religious service: Not on file    Active member of club or organization: Not on file    Attends meetings of clubs or organizations: Not on file    Relationship status: Not on file  . Intimate partner violence:    Fear of current or ex partner: Not on file    Emotionally  abused: Not on file    Physically abused: Not on file    Forced sexual activity: Not on file  Other Topics Concern  . Not on file  Social History Narrative  . Not on file    Past Surgical History:  Procedure Laterality Date  . NO PAST SURGERIES      Immunization History  Administered Date(s) Administered  . Influenza,inj,Quad PF,6+ Mos 09/28/2016  . Pneumococcal Polysaccharide-23 06/15/2017    No outpatient medications have been marked as taking for the 03/16/18 encounter (Office Visit) with [name], FNP.   Allergies  Allergen Reactions  . Lactose Intolerance (Gi) Nausea And Vomiting  . Other Swelling    All beans  . Nitroglycerin Anxiety and Other (See Comments)    Pain and feels like he is on fire    BP (!) 190/104   Pulse 94   Ht 5\' 8"  (1.727 m)   SpO2 98%   BMI 30.17 kg/m      Review of Systems  Constitutional: Negative.   HENT:  Negative.   Eyes: Negative.   Cardiovascular: Negative.   Gastrointestinal: Negative.   Endocrine: Negative.   Genitourinary: Negative.   Musculoskeletal: Negative.   Skin: Negative.   Allergic/Immunologic: Negative.   Neurological: Negative.   Hematological: Negative.   Psychiatric/Behavioral: Negative.    Objective:   Physical Exam  Constitutional: He is oriented to person, place, and time. He appears well-developed and well-nourished.  HENT:  Head: Normocephalic and atraumatic.  Right Ear: External ear normal.  Left Ear: External ear normal.  Nose: Nose normal.  Mouth/Throat: Oropharynx is clear and moist.  Eyes: Pupils are equal, round, and reactive to light. Conjunctivae and EOM are normal.  Neck: Normal range of motion. Neck supple.  Cardiovascular: Normal rate, regular rhythm, normal heart sounds and intact distal pulses.  Pulmonary/Chest: Effort normal and breath sounds normal.  Abdominal: Soft. Bowel sounds are normal.  Musculoskeletal: Normal range of motion.  Neurological: He is alert and oriented  to person, place, and time.  Skin: Skin is warm. Capillary refill takes less than 2 seconds.  Psychiatric: He has a normal mood and affect. His behavior is normal. Judgment and thought content normal.  Vitals reviewed.  Assessment & Plan:

## 2018-03-16 NOTE — Progress Notes (Signed)
Subjective:    Patient ID: Stanley [name], male    DOB: [birth year], 42 y.o.   MRN: [id number]   PCP: Kathe Becton, NP  Chief Complaint  Patient presents with  . Blood Pressure Check    HPI   Stanley [name] has a most recent history of multiple Hospital Admission for Hypertensive Emergency. He is here today for blood pressure check.   Current Status: Prior to his last office visit on 03/01/2018, Stanley [name] had discontinued all medications. Since then he states that he had been taking all his medications daily as prescribed.  He is scheduled to come in for weekly blood pressure checks. He states that he has been taking all blood pressure medications consistently for 2 weeks now. He is accompanied today by his brother.   He denies dizziness, confusion, headaches, vision changes, unsteadiness, lightheadednes, and falls. He denies chest pain, cough, heart palpitations, and shortness of breath. He denies nausea and vomiting today.  Past Medical History:  Diagnosis Date  . Anxiety   . CKD (chronic kidney disease), stage IV ([place])   . Depression   . Dyslexia   . High cholesterol   . Hypertension   . Stroke ([facility]) 06/03/2017   "minor; completely recovered today" (06/13/2017)  . Tobacco abuse     Family History  Problem Relation Age of Onset  . Hypertension Mother   . Hypertension Brother   . Diabetes type II Brother     Social History   Socioeconomic History  . Marital status: Married    Spouse name: Not on file  . Number of children: Not on file  . Years of education: Not on file  . Highest education level: Not on file  Occupational History  . Not on file  Social Needs  . Financial resource strain: Not on file  . Food insecurity:    Worry: Not on file    Inability: Not on file  . Transportation needs:    Medical: Not on file    Non-medical: Not on file  Tobacco Use  . Smoking status: Current Every Day Smoker    Packs/day: 1.00    Years: 30.00    Pack years: 30.00     Types: Cigarettes  . Smokeless tobacco: Never Used  Substance and Sexual Activity  . Alcohol use: No  . Drug use: Yes    Types: Marijuana    Comment: 06/13/2017 "not often"  . Sexual activity: Not Currently  Lifestyle  . Physical activity:    Days per week: Not on file    Minutes per session: Not on file  . Stress: Not on file  Relationships  . Social connections:    Talks on phone: Not on file    Gets together: Not on file    Attends religious service: Not on file    Active member of club or organization: Not on file    Attends meetings of clubs or organizations: Not on file    Relationship status: Not on file  . Intimate partner violence:    Fear of current or ex partner: Not on file    Emotionally abused: Not on file    Physically abused: Not on file    Forced sexual activity: Not on file  Other Topics Concern  . Not on file  Social History Narrative  . Not on file    Past Surgical History:  Procedure Laterality Date  . NO PAST SURGERIES     Immunization History  Administered Date(s)  Administered  . Influenza,inj,Quad PF,6+ Mos 09/28/2016  . Pneumococcal Polysaccharide-23 06/15/2017   No outpatient medications have been marked as taking for the 03/16/18 encounter (Office Visit) with [name], FNP.   Allergies  Allergen Reactions  . Lactose Intolerance (Gi) Nausea And Vomiting  . Other Swelling    All beans  . Nitroglycerin Anxiety and Other (See Comments)    Pain and feels like he is on fire    BP (!) 190/104   Pulse 94   Ht 5\' 8"  (1.727 m)   SpO2 98%   BMI 30.17 kg/m    Review of Systems  Constitutional: Negative.   HENT: Negative.   Eyes: Negative.   Respiratory: Negative.   Cardiovascular: Negative.   Gastrointestinal: Negative.   Endocrine: Negative.   Genitourinary: Negative.   Musculoskeletal: Negative.   Skin: Negative.   Allergic/Immunologic: Negative.   Neurological: Negative.   Hematological: Negative.    Psychiatric/Behavioral: Negative.    Objective:   Physical Exam  Constitutional: He is oriented to person, place, and time. He appears well-developed and well-nourished.  HENT:  Head: Normocephalic and atraumatic.  Right Ear: External ear normal.  Left Ear: External ear normal.  Nose: Nose normal.  Mouth/Throat: Oropharynx is clear and moist.  Eyes: Pupils are equal, round, and reactive to light. Conjunctivae and EOM are normal.  Neck: Normal range of motion. Neck supple.  Cardiovascular: Normal rate, regular rhythm, normal heart sounds and intact distal pulses.  Pulmonary/Chest: Effort normal and breath sounds normal.  Abdominal: Soft. Bowel sounds are normal.  Musculoskeletal: Normal range of motion.  Neurological: He is alert and oriented to person, place, and time.  Skin: Skin is warm and dry. Capillary refill takes less than 2 seconds.  Psychiatric: He has a normal mood and affect. His behavior is normal. Judgment and thought content normal.  Nursing note and vitals reviewed.  Assessment & Plan:   1. Blood pressure check Returned today for blood pressure check because of elevated blood pressure of 190/104 we will administer Clonidine 0.2 mg.  2. Hypertensive crisis Blood pressure readings today continue to be elevated at 190/104.  After dose of Clonidine 0.2 mg, patient was told to lie on left side, blood pressure was re-assessed after 20 minutes. Blood pressure decreased to 186/104.   After consultation with Dr. Doreene Burke, we will add additional medication to his current regimen. We will initiate him on a trial dose of Imdur 30 mg today X 2 weeks.  He is to keep a daily blood pressure diary and monitor for decreasing blood pressure.   He was counseled to avoid combining Viagra and other nitrates with Imdur because of the damaging effects of increased vasodilation. If he does need to use other nitrates, he will allow at least a 6 hour window after taking Imdur to minimize the  possible damaging effects of increased vasodilation.   Patient was also, once again referred to ED for further evaluation. Patient refused to report to ED, stating that he would get his family to monitor him. He stated that he would call EMS if symptoms began once he was at home. Patient was counseled on the possible complications of increased blood pressures such as ischemic and hemorrhagic stroke. He stated that he would continue to take medications and follow up with office on Friday, 03/23/2018.  - cloNIDine (CATAPRES) tablet 0.2 mg  3. Elevated creatinine  Creatinine ws 3.42, and Microalbumin/Creatinine Ratio was 870.1 on 03/01/2018. He saw follow Nephrologist on 03/12/2018.  4. Follow up He will follow up on Friday 03/23/2018 for blood pressure check and evaluate effectiveness of Imdur.     Meds ordered this encounter  Medications  . cloNIDine (CATAPRES) tablet 0.2 mg   Kathe Becton,  MSN, FNP-BC [facility] [address] [place], [place] [address] [phone]

## 2018-03-19 MED FILL — hydrALAZINE HCL 100 MG TABS: 100 | 30 days supply | Qty: 90 | Fill #1

## 2018-03-23 ENCOUNTER — Ambulatory Visit: Payer: Self-pay | Admitting: Family Medicine

## 2018-03-23 VITALS — BP 190/100 | HR 70 | Temp 98.0°F | Ht 68.0 in

## 2018-03-23 DIAGNOSIS — Z09 Encounter for follow-up examination after completed treatment for conditions other than malignant neoplasm: Secondary | ICD-10-CM

## 2018-03-23 DIAGNOSIS — F419 Anxiety disorder, unspecified: Secondary | ICD-10-CM

## 2018-03-23 DIAGNOSIS — I10 Essential (primary) hypertension: Secondary | ICD-10-CM

## 2018-03-23 DIAGNOSIS — F17200 Nicotine dependence, unspecified, uncomplicated: Secondary | ICD-10-CM

## 2018-03-23 MED ORDER — CLONIDINE HCL 0.1 MG PO TABS
0.2000 mg | ORAL_TABLET | Freq: Once | ORAL | Status: AC
  Administered 2018-03-23: 0.2 mg via ORAL

## 2018-03-23 NOTE — Progress Notes (Signed)
Subjective:    Patient ID: Stanley [name], male    DOB: 09-20-1976, 42 y.o.   MRN: [id number]  PCP: Kathe Becton, NP  Chief Complaint  Patient presents with  . Blood Pressure Check     HPI  Stanley [name] has a history of Tobacco Abuse, Stroke, Hypertension, Deslexia, Depression, CKD, and Anxiety.    Current Status: He is doing well with c/o mild headache when he began taking Imdur for the the first day. He states that he has not taken any more since then. He denies severe headaches, confusion, seizures, double vision, and blurred vision, nausea and vomiting.   He denies fevers, chills, fatigue, recent infections, weight loss, and night sweats. He has not had any headaches, visual changes, dizziness, and falls.   He has a cough and shortness of breath on exertion. No chest pain, heart palpitations reported.   No reports of GI problems. He has no reports of blood in stools, dysuria and hematuria. No depression or anxiety. He has no pain today.   He states that he is only smoking 1 cigarette/day now.   Past Medical History:  Diagnosis Date  . Anxiety   . CKD (chronic kidney disease), stage IV ([place])   . Depression   . Dyslexia   . High cholesterol   . Hypertension   . Stroke ([place]) 06/03/2017   "minor; completely recovered today" (06/13/2017)  . Tobacco abuse     Family History  Problem Relation Age of Onset  . Hypertension Mother   . Hypertension Brother   . Diabetes type II Brother     Social History   Socioeconomic History  . Marital status: Married    Spouse name: Not on file  . Number of children: Not on file  . Years of education: Not on file  . Highest education level: Not on file  Occupational History  . Not on file  Social Needs  . Financial resource strain: Not on file  . Food insecurity:    Worry: Not on file    Inability: Not on file  . Transportation needs:    Medical: Not on file    Non-medical: Not on file  Tobacco Use  . Smoking status: Current  Every Day Smoker    Packs/day: 1.00    Years: 30.00    Pack years: 30.00    Types: Cigarettes  . Smokeless tobacco: Never Used  Substance and Sexual Activity  . Alcohol use: No  . Drug use: Yes    Types: Marijuana    Comment: 06/13/2017 "not often"  . Sexual activity: Not Currently  Lifestyle  . Physical activity:    Days per week: Not on file    Minutes per session: Not on file  . Stress: Not on file  Relationships  . Social connections:    Talks on phone: Not on file    Gets together: Not on file    Attends religious service: Not on file    Active member of club or organization: Not on file    Attends meetings of clubs or organizations: Not on file    Relationship status: Not on file  . Intimate partner violence:    Fear of current or ex partner: Not on file    Emotionally abused: Not on file    Physically abused: Not on file    Forced sexual activity: Not on file  Other Topics Concern  . Not on file  Social History Narrative  . Not on  file    Past Surgical History:  Procedure Laterality Date  . NO PAST SURGERIES      Immunization History  Administered Date(s) Administered  . Influenza,inj,Quad PF,6+ Mos 09/28/2016  . Pneumococcal Polysaccharide-23 06/15/2017    No outpatient medications have been marked as taking for the 03/23/18 encounter (Clinical Support) with [name], FNP.    Allergies  Allergen Reactions  . Lactose Intolerance (Gi) Nausea And Vomiting  . Other Swelling    All beans  . Nitroglycerin Anxiety and Other (See Comments)    Pain and feels like he is on fire    BP (!) 190/100 (BP Location: Right Arm, Patient Position: Sitting, Cuff Size: Small)   Pulse 70   Temp 98 F (36.7 C) (Oral)   Ht 5\' 8"  (1.727 m)   SpO2 98%   BMI 30.17 kg/m   Review of Systems  Constitutional: Negative.   HENT: Negative.   Eyes: Negative.   Respiratory: Negative.   Cardiovascular: Negative.   Gastrointestinal: Negative.   Endocrine: Negative.    Genitourinary: Negative.   Musculoskeletal: Negative.   Skin: Negative.   Allergic/Immunologic: Negative.   Neurological: Negative.   Hematological: Negative.   Psychiatric/Behavioral: Negative.    Objective:   Physical Exam  Constitutional: He is oriented to person, place, and time. He appears well-developed and well-nourished.  HENT:  Head: Normocephalic and atraumatic.  Right Ear: External ear normal.  Left Ear: External ear normal.  Nose: Nose normal.  Mouth/Throat: Oropharynx is clear and moist.  Eyes: Pupils are equal, round, and reactive to light. Conjunctivae and EOM are normal.  Neck: Normal range of motion. Neck supple.  Cardiovascular: Normal rate, regular rhythm, normal heart sounds and intact distal pulses.  Pulmonary/Chest: Effort normal and breath sounds normal.  Abdominal: Soft. Bowel sounds are normal.  Musculoskeletal: Normal range of motion.  Neurological: He is alert and oriented to person, place, and time.  Skin: Skin is warm and dry. Capillary refill takes less than 2 seconds.  Psychiatric: He has a normal mood and affect. His behavior is normal. Judgment and thought content normal.  Nursing note and vitals reviewed.  Assessment & Plan:   1. Hypertension, accelerated Increased blood pressure today at 190/100. He received Clonidine 0.2 mg, observed for 20 minutes on his left side and retake blood pressure of 182/90. He will continue all blood pressure medications as prescribed. He will restart Imdur as directed, and report to office if he develops worsening headaches.   He will decrease high sodium intake, excessive alcohol intake, increase potassium intake, smoking cessation, and increase physical activity to at least 30 minutes a day. He will follow DASH diet.  - cloNIDine (CATAPRES) tablet 0.2 mg  2. Smoker He is down to 1 cigarette/day. Monitor.   3. Anxiety Stable.   4. Follow up He will follow up for blood pressure check in 1 week.   Meds  ordered this encounter  Medications  . cloNIDine (CATAPRES) tablet 0.2 mg    Kathe Becton,  MSN, FNP-BC Patient [facility] [address] [place], [place] [address] [phone]  .

## 2018-04-02 ENCOUNTER — Other Ambulatory Visit: Payer: Self-pay | Admitting: Family Medicine

## 2018-04-02 ENCOUNTER — Ambulatory Visit: Payer: Self-pay | Admitting: Family Medicine

## 2018-04-02 VITALS — BP 170/80 | HR 75 | Temp 98.0°F | Ht 68.0 in

## 2018-04-02 DIAGNOSIS — Z0130 Encounter for examination of blood pressure without abnormal findings: Secondary | ICD-10-CM

## 2018-04-02 NOTE — Progress Notes (Signed)
Patient was advise to continue taking medication and to keep scheduled follow up.

## 2018-04-09 ENCOUNTER — Ambulatory Visit: Payer: Self-pay | Admitting: Family Medicine

## 2018-04-09 VITALS — BP 194/116 | HR 68 | Temp 98.3°F

## 2018-04-09 DIAGNOSIS — Z09 Encounter for follow-up examination after completed treatment for conditions other than malignant neoplasm: Secondary | ICD-10-CM

## 2018-04-09 DIAGNOSIS — I10 Essential (primary) hypertension: Secondary | ICD-10-CM

## 2018-04-09 MED ORDER — CLONIDINE HCL 0.1 MG PO TABS
0.1000 mg | ORAL_TABLET | Freq: Once | ORAL | Status: AC
  Administered 2018-04-09: 0.1 mg via ORAL

## 2018-04-09 MED ORDER — CLONIDINE HCL 0.1 MG PO TABS
0.2000 mg | ORAL_TABLET | Freq: Once | ORAL | Status: AC
  Administered 2018-04-09: 0.2 mg via ORAL

## 2018-04-09 NOTE — Progress Notes (Signed)
Subjective:    Patient ID: Stanley [name], male    DOB: Jan 06, 1976, 42 y.o.   MRN: [id number]   PCP: Kathe Becton, NP  Chief Complaint  Patient presents with  . Blood Pressure Check    HPI  Stanley [name] has a past medical history of Tobacco Abuse, Stroke, Hypertension, Depression, CKD, and Anxiety. He is here today for blood pressure check.   Current Status: Since his last office visit, he is doing well with no complaints.   He denies fevers, chills, fatigue, recent infections, weight loss, and night sweats.   He has not had any headaches, visual changes, dizziness, and falls.   He has chest pain. No heart palpitations, cough and shortness of breath reported.   No reports of GI problems such as nausea, vomiting, diarrhea, and constipation. He has no reports of blood in stools, dysuria and hematuria.   No depression or anxiety.    She denies pain today.   He states that he is taking all of his medications as prescribed.   Past Medical History:  Diagnosis Date  . Anxiety   . CKD (chronic kidney disease), stage IV ([place])   . Depression   . Dyslexia   . High cholesterol   . Hypertension   . Stroke ([place]) 06/03/2017   "minor; completely recovered today" (06/13/2017)  . Tobacco abuse     Family History  Problem Relation Age of Onset  . Hypertension Mother   . Hypertension Brother   . Diabetes type II Brother     Social History   Socioeconomic History  . Marital status: Married    Spouse name: Not on file  . Number of children: Not on file  . Years of education: Not on file  . Highest education level: Not on file  Occupational History  . Not on file  Social Needs  . Financial resource strain: Not on file  . Food insecurity:    Worry: Not on file    Inability: Not on file  . Transportation needs:    Medical: Not on file    Non-medical: Not on file  Tobacco Use  . Smoking status: Current Every Day Smoker    Packs/day: 1.00    Years: 30.00    Pack years:  30.00    Types: Cigarettes  . Smokeless tobacco: Never Used  Substance and Sexual Activity  . Alcohol use: No  . Drug use: Yes    Types: Marijuana    Comment: 06/13/2017 "not often"  . Sexual activity: Not Currently  Lifestyle  . Physical activity:    Days per week: Not on file    Minutes per session: Not on file  . Stress: Not on file  Relationships  . Social connections:    Talks on phone: Not on file    Gets together: Not on file    Attends religious service: Not on file    Active member of club or organization: Not on file    Attends meetings of clubs or organizations: Not on file    Relationship status: Not on file  . Intimate partner violence:    Fear of current or ex partner: Not on file    Emotionally abused: Not on file    Physically abused: Not on file    Forced sexual activity: Not on file  Other Topics Concern  . Not on file  Social History Narrative  . Not on file    Past Surgical History:  Procedure Laterality Date  .  NO PAST SURGERIES     Immunization History  Administered Date(s) Administered  . Influenza,inj,Quad PF,6+ Mos 09/28/2016  . Pneumococcal Polysaccharide-23 06/15/2017    No outpatient medications have been marked as taking for the 04/09/18 encounter (Clinical Support) with [name], FNP.    Allergies  Allergen Reactions  . Lactose Intolerance (Gi) Nausea And Vomiting  . Other Swelling    All beans  . Nitroglycerin Anxiety and Other (See Comments)    Pain and feels like he is on fire    BP (!) 196/110   Pulse 78   Temp 98.3 F (36.8 C) (Oral)   SpO2 100%   Review of Systems  Constitutional: Negative.   HENT: Negative.   Eyes: Negative.   Respiratory: Negative.   Cardiovascular: Positive for chest pain.  Gastrointestinal: Positive for abdominal pain.  Endocrine: Negative.   Genitourinary: Negative.   Musculoskeletal: Negative.   Skin: Negative.   Allergic/Immunologic: Negative.   Neurological: Negative.    Hematological: Negative.   Psychiatric/Behavioral: Negative.    Objective:   Physical Exam  Constitutional: He is oriented to person, place, and time. He appears well-developed and well-nourished.  HENT:  Head: Normocephalic and atraumatic.  Right Ear: External ear normal.  Left Ear: External ear normal.  Nose: Nose normal.  Mouth/Throat: Oropharynx is clear and moist.  Eyes: Pupils are equal, round, and reactive to light. Conjunctivae are normal.  Neck: Normal range of motion. Neck supple.  Cardiovascular: Normal rate, regular rhythm, normal heart sounds and intact distal pulses.  Pulmonary/Chest: Effort normal and breath sounds normal.  Abdominal: Soft. Bowel sounds are normal.  Musculoskeletal: Normal range of motion.  Neurological: He is oriented to person, place, and time.  Skin: Skin is warm. Capillary refill takes less than 2 seconds.  Psychiatric: He has a normal mood and affect. His behavior is normal. Judgment and thought content normal.  Nursing note and vitals reviewed.  Assessment & Plan:   1. Hypertension, accelerated Total of Clonidine 0.3 mg given in office today to help lower blood pressure.  Blood pressure readings continued to be elevated. Recommended that patient report to ED for further evaluation of elevated blood pressures. Patient refused and states that his family will keep an eye on him. He is advised to take all blood pressure medications as directed, eat a heart healthy diet, and exercise at least 30 minutes of cardio exercise [name]. He is scheduled to follow up with Nephrologist soon.  - cloNIDine (CATAPRES) tablet 0.2 mg - cloNIDine (CATAPRES) tablet 0.1 mg  2. Follow up He will follow up in 1 month for blood pressure check.   Meds ordered this encounter  Medications  . cloNIDine (CATAPRES) tablet 0.2 mg  . cloNIDine (CATAPRES) tablet 0.1 mg    Kathe Becton,  MSN, FNP-C Patient [facility] [address] [place], [place] [address] [phone]

## 2018-04-11 ENCOUNTER — Other Ambulatory Visit: Payer: Self-pay

## 2018-04-11 MED ORDER — LABETALOL HCL 200 MG PO TABS
400.0000 mg | ORAL_TABLET | Freq: Three times a day (TID) | ORAL | 0 refills | Status: DC
Start: 2018-04-11 — End: 2018-05-22

## 2018-04-11 MED FILL — traZODone HCL 50 MG TABS: 50 | 30 days supply | Qty: 30 | Fill #4

## 2018-04-11 MED FILL — POTASSIUM CL ER 20 MEQ TAB: 20 | 30 days supply | Qty: 30 | Fill #0

## 2018-04-17 ENCOUNTER — Ambulatory Visit: Payer: Self-pay | Admitting: Family Medicine

## 2018-04-17 VITALS — BP 202/108 | HR 74 | Temp 98.1°F | Ht 68.0 in

## 2018-04-17 DIAGNOSIS — I10 Essential (primary) hypertension: Secondary | ICD-10-CM

## 2018-04-17 MED ORDER — CLONIDINE HCL 0.1 MG PO TABS
0.1000 mg | ORAL_TABLET | Freq: Once | ORAL | Status: AC
  Administered 2018-04-17: 0.1 mg via ORAL

## 2018-04-17 MED ORDER — CLONIDINE HCL 0.1 MG PO TABS
0.2000 mg | ORAL_TABLET | Freq: Once | ORAL | Status: AC
  Administered 2018-04-17: 0.2 mg via ORAL

## 2018-04-17 NOTE — Progress Notes (Signed)
Subjective:    Patient ID: Stanley [name], male    DOB: 18-Nov-1975, 42 y.o.   MRN: [id number]   PCP: Kathe Becton, NP  Chief Complaint  Patient presents with  . Blood Pressure Check    HPI  Stanley [name] has a past medical history of Tobacco Abuse, Stroke, Hypertension, Depression, CKD, and Anxiety. He is here today for blood pressure check.   Current Status: Since his last office visit, he states that he is doing well with no complaints. His blood pressure continues to be elevated. He states that he is taking all blood pressure medications as prescribed. He states that he recently visited the kidney specialist last week.   He denies fevers, chills, fatigue, recent infections, weight loss, and night sweats.   He has not had any headaches, visual changes, dizziness, and falls.   He has chest pain. No heart palpitations, cough and shortness of breath reported.   No reports of GI problems such as nausea, vomiting, diarrhea, and constipation. He has no reports of blood in stools, dysuria and hematuria.   No depression or anxiety.    She denies pain today.   Past Medical History:  Diagnosis Date  . Anxiety   . CKD (chronic kidney disease), stage IV ([place])   . Depression   . Dyslexia   . High cholesterol   . Hypertension   . Stroke ([facility]) 06/03/2017   "minor; completely recovered today" (06/13/2017)  . Tobacco abuse     Family History  Problem Relation Age of Onset  . Hypertension Mother   . Hypertension Brother   . Diabetes type II Brother     Social History   Socioeconomic History  . Marital status: Married    Spouse name: Not on file  . Number of children: Not on file  . Years of education: Not on file  . Highest education level: Not on file  Occupational History  . Not on file  Social Needs  . Financial resource strain: Not on file  . Food insecurity:    Worry: Not on file    Inability: Not on file  . Transportation needs:    Medical: Not on file   Non-medical: Not on file  Tobacco Use  . Smoking status: Current Every Day Smoker    Packs/day: 1.00    Years: 30.00    Pack years: 30.00    Types: Cigarettes  . Smokeless tobacco: Never Used  Substance and Sexual Activity  . Alcohol use: No  . Drug use: Yes    Types: Marijuana    Comment: 06/13/2017 "not often"  . Sexual activity: Not Currently  Lifestyle  . Physical activity:    Days per week: Not on file    Minutes per session: Not on file  . Stress: Not on file  Relationships  . Social connections:    Talks on phone: Not on file    Gets together: Not on file    Attends religious service: Not on file    Active member of club or organization: Not on file    Attends meetings of clubs or organizations: Not on file    Relationship status: Not on file  . Intimate partner violence:    Fear of current or ex partner: Not on file    Emotionally abused: Not on file    Physically abused: Not on file    Forced sexual activity: Not on file  Other Topics Concern  . Not on file  Social  History Narrative  . Not on file    Past Surgical History:  Procedure Laterality Date  . NO PAST SURGERIES     Immunization History  Administered Date(s) Administered  . Influenza,inj,Quad PF,6+ Mos 09/28/2016  . Pneumococcal Polysaccharide-23 06/15/2017    No outpatient medications have been marked as taking for the 04/17/18 encounter (Clinical Support) with [name], FNP.    Allergies  Allergen Reactions  . Lactose Intolerance (Gi) Nausea And Vomiting  . Other Swelling    All beans  . Nitroglycerin Anxiety and Other (See Comments)    Pain and feels like he is on fire    BP (!) 202/108   Pulse 74   Temp 98.1 F (36.7 C) (Oral)   Ht 5\' 8"  (1.727 m)   SpO2 100%   BMI 30.17 kg/m   Review of Systems  Constitutional: Negative.   HENT: Negative.   Eyes: Negative.   Respiratory: Negative.   Cardiovascular: Positive for chest pain.  Gastrointestinal: Positive for abdominal  pain.  Endocrine: Negative.   Genitourinary: Negative.   Musculoskeletal: Negative.   Skin: Negative.   Allergic/Immunologic: Negative.   Neurological: Negative.   Hematological: Negative.   Psychiatric/Behavioral: Negative.    Objective:   Physical Exam  Constitutional: He is oriented to person, place, and time. He appears well-developed and well-nourished.  HENT:  Head: Normocephalic and atraumatic.  Right Ear: External ear normal.  Left Ear: External ear normal.  Nose: Nose normal.  Mouth/Throat: Oropharynx is clear and moist.  Eyes: Pupils are equal, round, and reactive to light. Conjunctivae are normal.  Neck: Normal range of motion. Neck supple.  Cardiovascular: Normal rate, regular rhythm, normal heart sounds and intact distal pulses.  Pulmonary/Chest: Effort normal and breath sounds normal.  Abdominal: Soft. Bowel sounds are normal.  Musculoskeletal: Normal range of motion.  Neurological: He is oriented to person, place, and time.  Skin: Skin is warm. Capillary refill takes less than 2 seconds.  Psychiatric: He has a normal mood and affect. His behavior is normal. Judgment and thought content normal.  Nursing note and vitals reviewed.  Assessment & Plan:   1. Hypertension, accelerated Total of Clonidine 0.3 mg given in office today to help lower blood pressure.  Blood pressure readings continued to be elevated. Recommended that patient report to ED for further evaluation of elevated blood pressures. Patient refused and states that his family will keep an eye on him. He is advised to take all blood pressure medications as directed, eat a heart healthy diet, and exercise at least 30 minutes of cardio exercise daily. He had recent follow up with Nephrologist. We will attempt to call patient on a daily basis to remind him to take medications.   - cloNIDine (CATAPRES) tablet 0.2 mg - cloNIDine (CATAPRES) tablet 0.1 mg  2. Follow up He will follow up in 1 month for blood  pressure check.   Meds ordered this encounter  Medications  . cloNIDine (CATAPRES) tablet 0.2 mg  . cloNIDine (CATAPRES) tablet 0.1 mg    Kathe Becton,  MSN, FNP-C Patient [facility] [address] [place], [place] [address] [phone]

## 2018-04-18 ENCOUNTER — Other Ambulatory Visit: Payer: Self-pay | Admitting: Family Medicine

## 2018-04-18 MED FILL — FUROSEMIDE 40 MG TAB: 40 | 30 days supply | Qty: 30 | Fill #1

## 2018-04-18 MED FILL — ISOSORBIDE MN ER 30 MG TAB: 30 | 15 days supply | Qty: 15 | Fill #0

## 2018-04-18 MED FILL — cloNIDine HCL 0.3 MG TABS: 0.3 | 30 days supply | Qty: 90 | Fill #1

## 2018-04-18 MED FILL — AMLODIPINE BESYLATE 10 MG T: 10 | 30 days supply | Qty: 30 | Fill #2

## 2018-04-18 MED FILL — hydrALAZINE HCL 100 MG TABS: 100 | 30 days supply | Qty: 90 | Fill #2

## 2018-05-04 ENCOUNTER — Ambulatory Visit: Payer: Self-pay

## 2018-05-07 ENCOUNTER — Telehealth: Payer: Self-pay

## 2018-05-07 NOTE — Telephone Encounter (Signed)
Left a vm for patient to callback and schedule appointment

## 2018-05-07 NOTE — Telephone Encounter (Signed)
-----   Message from [name], [place] sent at 05/05/2018 10:49 PM EDT ----- Regarding: "Follow Up Appointment" Andria Frames,  Please schedule patient for follow up appointment within the week.   Thank you.

## 2018-05-08 ENCOUNTER — Ambulatory Visit: Payer: Self-pay

## 2018-05-09 ENCOUNTER — Ambulatory Visit: Payer: Self-pay | Admitting: Cardiology

## 2018-05-09 ENCOUNTER — Ambulatory Visit: Payer: Self-pay

## 2018-05-09 NOTE — Telephone Encounter (Signed)
-----   Message from [name], [place] sent at 05/05/2018 10:49 PM EDT ----- Regarding: "Follow Up Appointment" Andria Frames,  Please schedule patient for follow up appointment within the week.   Thank you.

## 2018-05-09 NOTE — Telephone Encounter (Signed)
Patient schedule for 05/09/18 for blood pressure check

## 2018-05-10 ENCOUNTER — Ambulatory Visit: Payer: Self-pay | Admitting: Family Medicine

## 2018-05-10 VITALS — BP 214/112 | HR 74 | Ht 68.0 in | Wt 198.0 lb

## 2018-05-10 DIAGNOSIS — Z0130 Encounter for examination of blood pressure without abnormal findings: Secondary | ICD-10-CM

## 2018-05-10 DIAGNOSIS — I10 Essential (primary) hypertension: Secondary | ICD-10-CM

## 2018-05-10 MED ORDER — CLONIDINE HCL 0.1 MG PO TABS: 0.1000 mg | ORAL_TABLET | Freq: Once | ORAL | Status: DC

## 2018-05-10 MED ORDER — CLONIDINE HCL 0.1 MG PO TABS
0.2000 mg | ORAL_TABLET | Freq: Once | ORAL | Status: AC
  Administered 2018-05-10: 0.2 mg via ORAL

## 2018-05-10 NOTE — Progress Notes (Signed)
Patient was advised to go to ED per Venora Maples and patient refused.

## 2018-05-18 ENCOUNTER — Encounter (HOSPITAL_COMMUNITY): Payer: Self-pay

## 2018-05-18 ENCOUNTER — Emergency Department (HOSPITAL_COMMUNITY)
Admission: EM | Admit: 2018-05-18 | Discharge: 2018-05-18 | Discharge status: Against Medical Advice | Payer: Self-pay | Attending: Emergency Medicine | Admitting: Emergency Medicine

## 2018-05-18 ENCOUNTER — Ambulatory Visit (HOSPITAL_BASED_OUTPATIENT_CLINIC_OR_DEPARTMENT_OTHER): Payer: Self-pay

## 2018-05-18 ENCOUNTER — Ambulatory Visit: Payer: Self-pay | Admitting: Family Medicine

## 2018-05-18 ENCOUNTER — Ambulatory Visit (HOSPITAL_COMMUNITY): Admission: RE | Admit: 2018-05-18 | Payer: Self-pay | Source: Ambulatory Visit

## 2018-05-18 ENCOUNTER — Other Ambulatory Visit: Payer: Self-pay

## 2018-05-18 VITALS — BP 186/116 | HR 74 | Ht 68.0 in

## 2018-05-18 DIAGNOSIS — M79661 Pain in right lower leg: Secondary | ICD-10-CM

## 2018-05-18 DIAGNOSIS — Z5321 Procedure and treatment not carried out due to patient leaving prior to being seen by health care provider: Secondary | ICD-10-CM | POA: Insufficient documentation

## 2018-05-18 DIAGNOSIS — M7989 Other specified soft tissue disorders: Secondary | ICD-10-CM

## 2018-05-18 DIAGNOSIS — R2241 Localized swelling, mass and lump, right lower limb: Secondary | ICD-10-CM | POA: Insufficient documentation

## 2018-05-18 DIAGNOSIS — I10 Essential (primary) hypertension: Secondary | ICD-10-CM

## 2018-05-18 DIAGNOSIS — Z09 Encounter for follow-up examination after completed treatment for conditions other than malignant neoplasm: Secondary | ICD-10-CM

## 2018-05-18 DIAGNOSIS — N189 Chronic kidney disease, unspecified: Secondary | ICD-10-CM

## 2018-05-18 DIAGNOSIS — M79662 Pain in left lower leg: Secondary | ICD-10-CM

## 2018-05-18 LAB — CBC WITH DIFFERENTIAL/PLATELET
Abs Immature Granulocytes: 0 10*3/uL (ref 0.0–0.1)
Basophils Absolute: 0.1 10*3/uL (ref 0.0–0.1)
Basophils Relative: 2 %
Eosinophils Absolute: 0.1 10*3/uL (ref 0.0–0.7)
Eosinophils Relative: 3 %
HCT: 41 % (ref 39.0–52.0)
Hemoglobin: 13 g/dL (ref 13.0–17.0)
Immature Granulocytes: 0 %
Lymphocytes Relative: 26 %
Lymphs Abs: 1.3 10*3/uL (ref 0.7–4.0)
MCH: 28.2 pg (ref 26.0–34.0)
MCHC: 31.7 g/dL (ref 30.0–36.0)
MCV: 88.9 fL (ref 78.0–100.0)
Monocytes Absolute: 0.5 10*3/uL (ref 0.1–1.0)
Monocytes Relative: 9 %
Neutro Abs: 3.1 10*3/uL (ref 1.7–7.7)
Neutrophils Relative %: 60 %
Platelets: 263 10*3/uL (ref 150–400)
RBC: 4.61 MIL/uL (ref 4.22–5.81)
RDW: 15.1 % (ref 11.5–15.5)
WBC: 5.1 10*3/uL (ref 4.0–10.5)

## 2018-05-18 LAB — BASIC METABOLIC PANEL
Anion gap: 9 (ref 5–15)
BUN: 32 mg/dL — ABNORMAL HIGH (ref 6–20)
CO2: 23 mmol/L (ref 22–32)
Calcium: 9.1 mg/dL (ref 8.9–10.3)
Chloride: 105 mmol/L (ref 98–111)
Creatinine, Ser: 3.24 mg/dL — ABNORMAL HIGH (ref 0.61–1.24)
GFR calc Af Amer: 26 mL/min — ABNORMAL LOW (ref >60)
GFR calc non Af Amer: 22 mL/min — ABNORMAL LOW (ref >60)
Glucose, Bld: 90 mg/dL (ref 70–99)
Potassium: 4 mmol/L (ref 3.5–5.1)
Sodium: 137 mmol/L (ref 135–145)

## 2018-05-18 IMAGING — US US SCROTUM W/ DOPPLER COMPLETE
1 series · 14 of 25 positions shown · non-contrast
Comparison: None.

CLINICAL DATA: Left testicular swelling for 1.5 years

EXAM:
SCROTAL ULTRASOUND
DOPPLER ULTRASOUND OF THE TESTICLES
TECHNIQUE: Complete ultrasound examination of the testicles, epididymis, and
other scrotal structures was performed. Color and spectral Doppler
ultrasound were also utilized to evaluate blood flow to the
testicles.

[Series 1: us scrotum w/ doppler complete · 0.09mm/px · 14 of 43 slices shown]
[im 1/43]
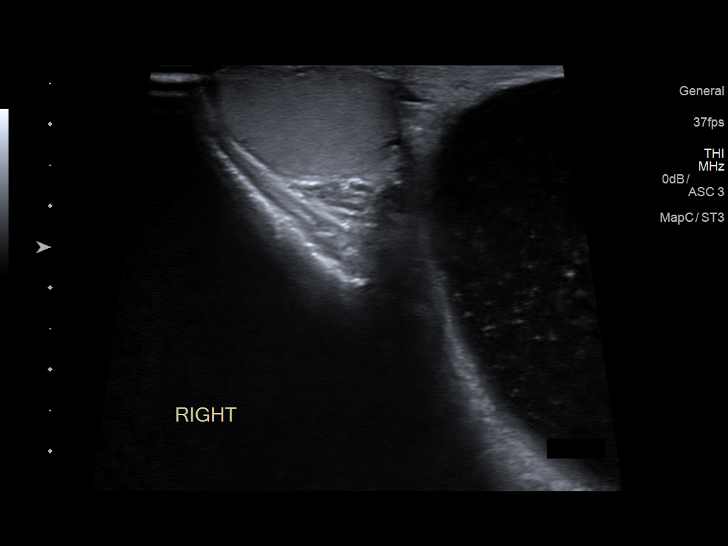
[im 4/43]
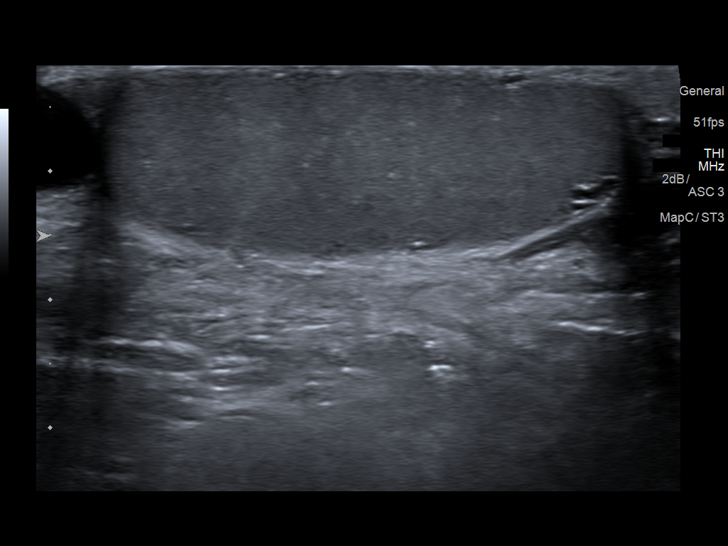
[im 8/43]
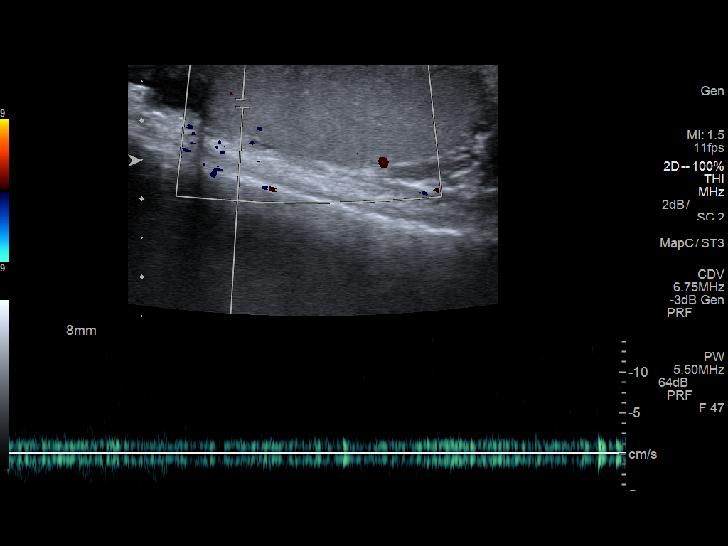
[im 11/43]
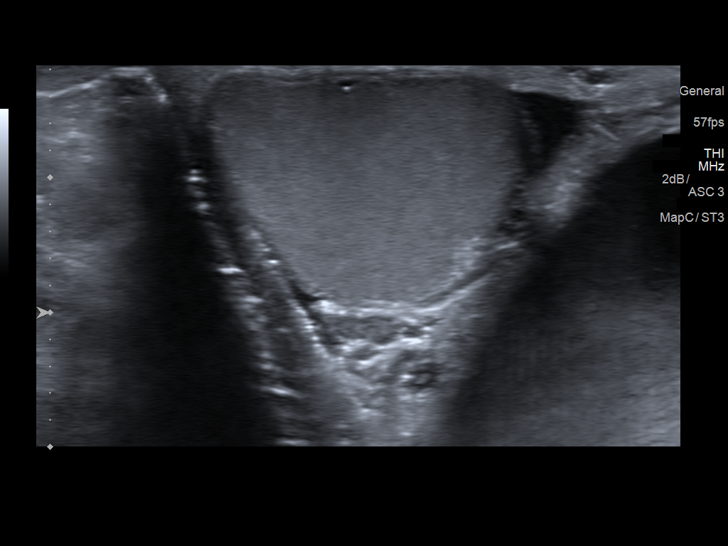
[im 15/43]
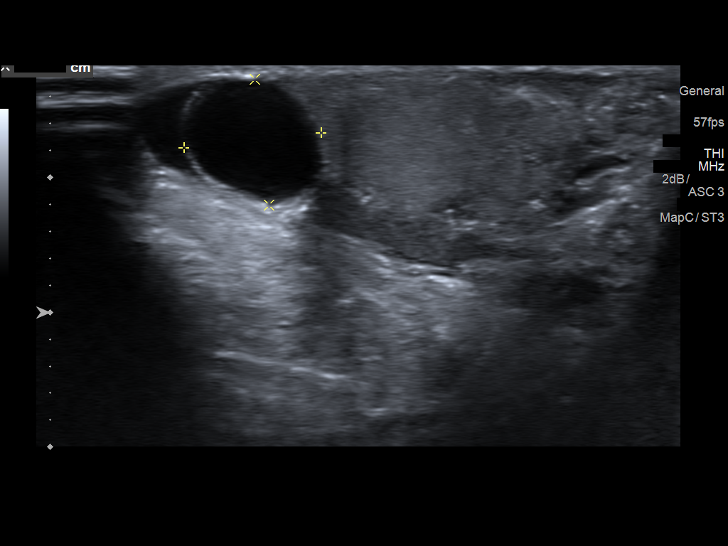
[im 16/43]
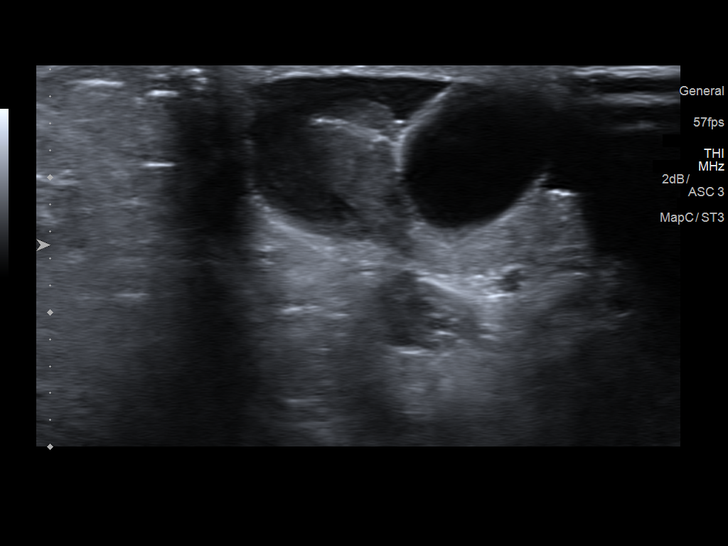
[im 20/43]
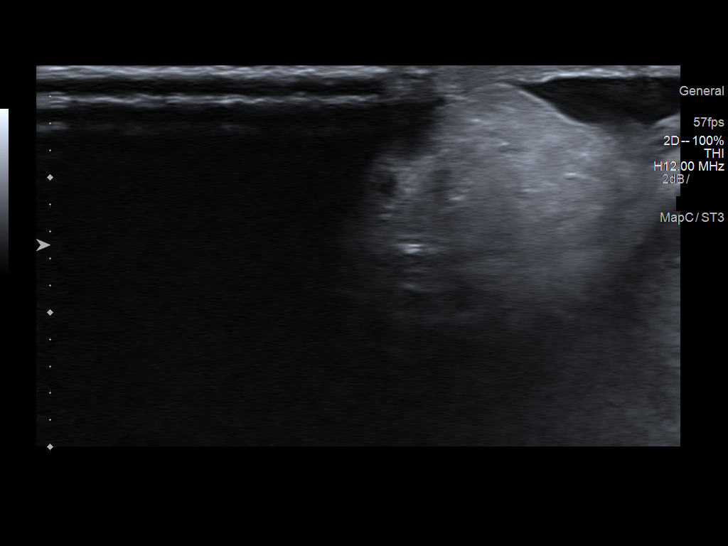
[im 23/43]
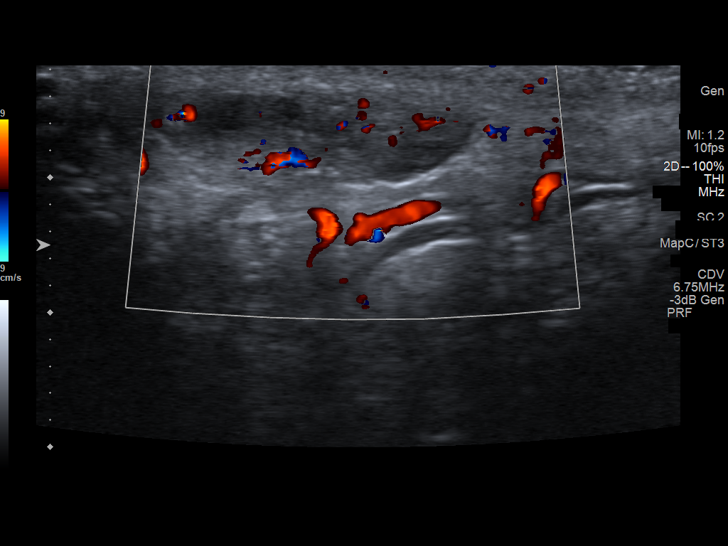
[im 27/43]
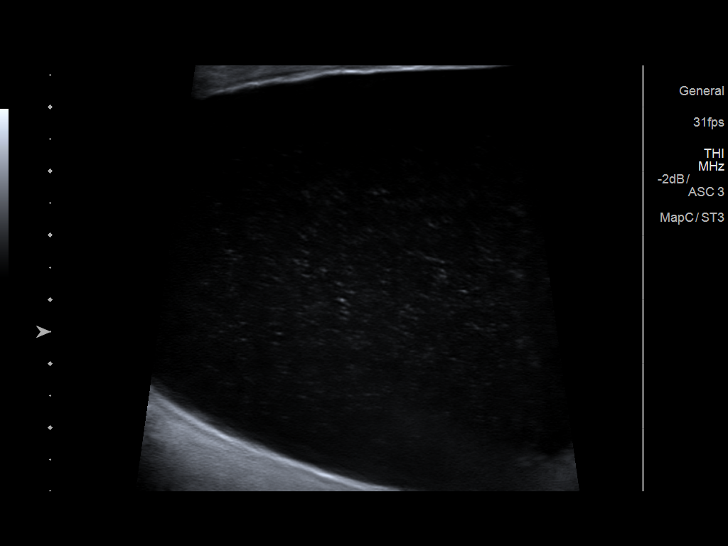
[im 29/43]
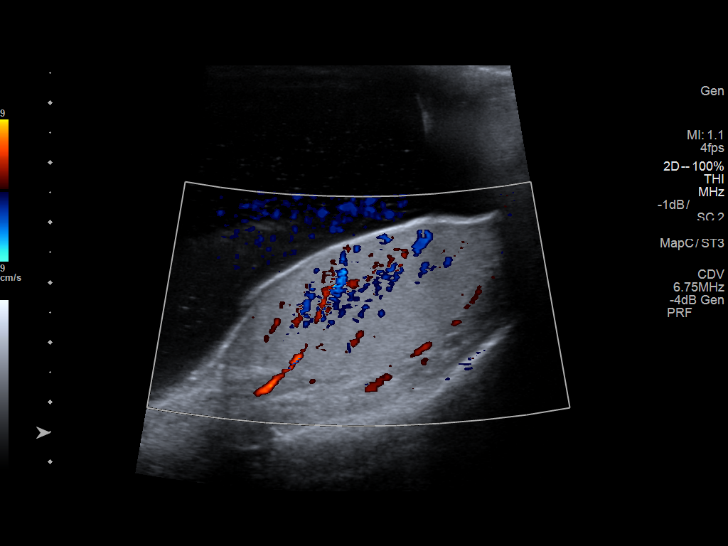
[im 32/43]
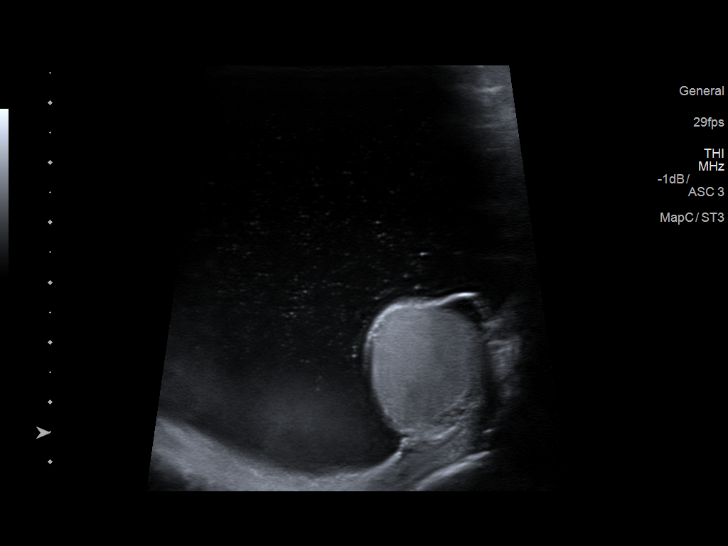
[im 36/43]
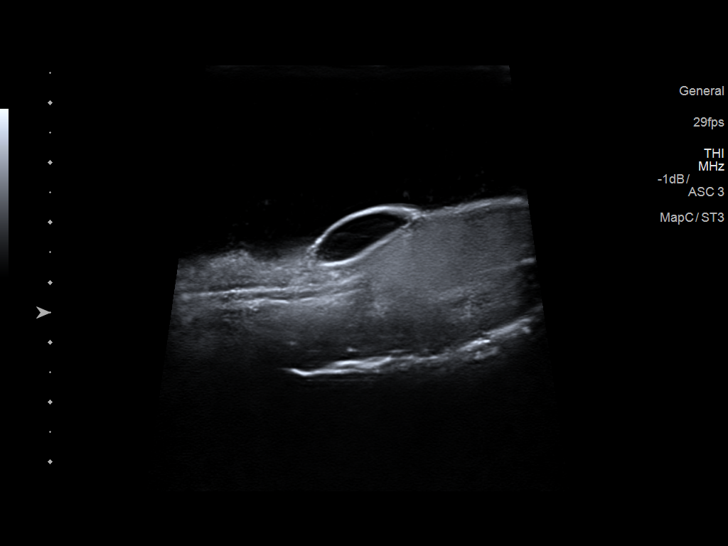
[im 39/43]
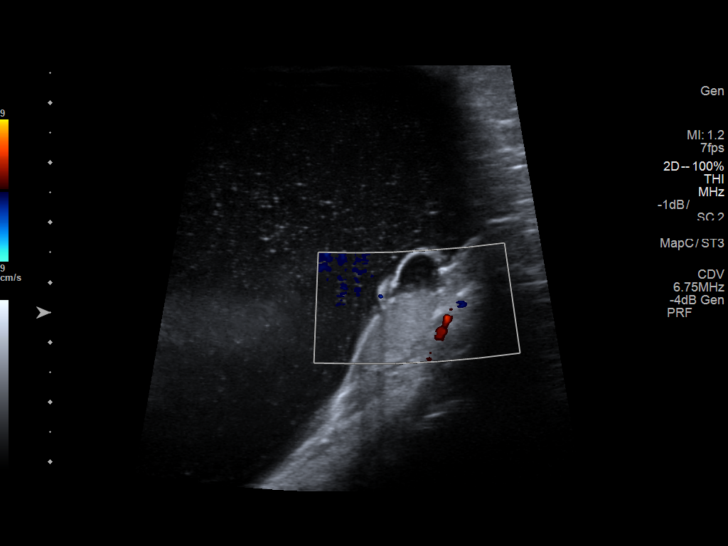
[im 43/43]
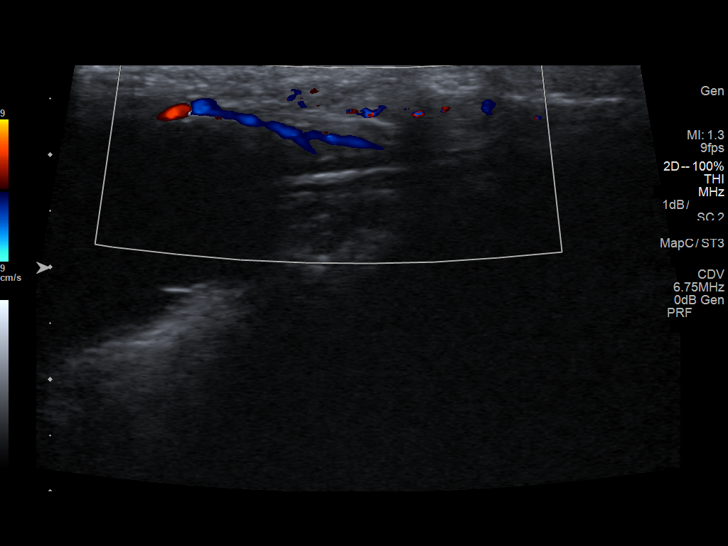

[14 of 25 positions shown; findings below may reference images not displayed]

FINDINGS: Right testicle

Measurements: 4 x 1.4 x 2.3 cm. No mass or microlithiasis
visualized.

Left testicle

Measurements: 5 x 2.5 x 2.2 cm. No mass or microlithiasis
visualized.

Right epididymis: Simple 1 x 0.4 x 1.1 cm epididymal cyst no the
head.

Left epididymis:  1.7 x 0.7 x 0.8 cm epididymal cyst along body.

Hydrocele: Moderate to large left and small right hydroceles,
complex on the left due to internal debris.

Varicocele:  None visualized.

Pulsed Doppler interrogation of both testes demonstrates normal low
resistance arterial and venous waveforms bilaterally.
IMPRESSION: 1. No evidence of testicular torsion or mass.
2. Bilateral epididymal cysts.
3. Moderate to large left hydrocele with small right hydrocele that
may have accounted for the left-sided testicular swelling.

## 2018-05-18 MED ORDER — CLONIDINE HCL 0.1 MG PO TABS
0.2000 mg | ORAL_TABLET | Freq: Once | ORAL | Status: AC
  Administered 2018-05-18: 0.2 mg via ORAL

## 2018-05-18 MED ORDER — CLONIDINE HCL 0.1 MG PO TABS
0.1000 mg | ORAL_TABLET | Freq: Once | ORAL | Status: AC
  Administered 2018-05-18: 0.1 mg via ORAL

## 2018-05-18 NOTE — Progress Notes (Signed)
RLE venous duplex prelim: negative for DVT. Palpable knot on shin was imaged, but no identifiable superficial vein is noted in this area. Landry Mellow, RDMS, RVT

## 2018-05-18 NOTE — ED Triage Notes (Signed)
Pt reports he had [facility]up [facility] and was told his BP was elevated. Sent over for further eval. Pt also reports a knot in his right leg and pain when walking. Small nodule noted to outer right leg.

## 2018-05-18 NOTE — ED Notes (Signed)
Called pt for vital recheck. No response.

## 2018-05-18 NOTE — ED Provider Notes (Cosign Needed)
Patient placed in Quick Look pathway, seen and evaluated   Chief Complaint: High blood pressure, leg pain  HPI: Patient sent from PCP today with concerns of high blood pressure, uncontrolled with his home medications.  Patient has a history of chronic kidney disease and also reports intermittent right leg swelling and tenderness.  No history of blood clots.  ROS:  Positive ROS: (+) Leg swelling Negative ROS: (-) Chest pain of breath  Physical Exam:   Gen: No distress  Neuro: Awake and Alert  Skin: Warm    Focused Exam: Heart RRR, nml S1,S2, no m/r/g; Lungs CTAB; Abd soft, NT, no rebound or guarding; Ext 2+ pedal pulses bilaterally, no edema.  BP (!) 191/115 (BP Location: Right Arm)   Pulse 72   Temp 98.9 F (37.2 C) (Oral)   Resp 18   SpO2 100%   Plan: Lower extremity Doppler and screening labs given hypertension and chronic kidney disease history.  Initiation of care has begun. The patient has been counseled on the process, plan, and necessity for staying for the completion/evaluation, and the remainder of the medical screening examination    Carlisle Cater, PA-C 05/18/18 1345

## 2018-05-18 NOTE — ED Notes (Signed)
Pt called 2x for vital recheck. No response.

## 2018-05-18 NOTE — Progress Notes (Signed)
Blood Pressure Check  Subjective:    Patient ID: Stanley [name], male    DOB: 1976/03/20, 42 y.o.   MRN: [id number]   No chief complaint on file.  HPI  Stanley [name] has a past medical history of Tobacco Use, Stroke, Hypertension, Hyperlipidemia, Dyslexia, Depression, CKD, and Anxiety.  He is here today for blood pressure check.  Current Status: Since his last office visit, he has c/o mild swelling and tenderness in his lower left leg. He report that he has had this pain off and on for about a week now, and occasionally he notices it in his right lower leg too. He denies shortness of breath, chest pain, cough, dyspnea at rest, wheezing, syncope, and hemoptysis.   Blood pressure is elevated today. He states that he is taking all antihypertensive medications as prescribed. He has not had a recent follow up with Nephrologist. He denies dizziness, visual changes, blurred vision, double vision, severe headaches. unsteadiness, and falls.He reports occasional shortness of breath. No chest pain, heart palpitations, and cough reported.   He denies fevers, chills, fatigue, recent infections, weight loss, and night sweats.   No reports of GI problems such as nausea, vomiting, diarrhea, and constipation. He has no reports of blood in stools, dysuria and hematuria.   No depression or anxiety, and denies suicidal ideations, homicidal ideations, or auditory hallucinations.  Past Medical History:  Diagnosis Date  . Anxiety   . CKD (chronic kidney disease), stage IV ([place])   . Depression   . Dyslexia   . High cholesterol   . Hypertension   . Stroke ([place]) 06/03/2017   "minor; completely recovered today" (06/13/2017)  . Tobacco abuse     Family History  Problem Relation Age of Onset  . Hypertension Mother   . Hypertension Brother   . Diabetes type II Brother     Social History   Socioeconomic History  . Marital status: Married    Spouse name: Not on file  . Number of children: Not on file  . Years  of education: Not on file  . Highest education level: Not on file  Occupational History  . Not on file  Social Needs  . Financial resource strain: Not on file  . Food insecurity:    Worry: Not on file    Inability: Not on file  . Transportation needs:    Medical: Not on file    Non-medical: Not on file  Tobacco Use  . Smoking status: Current Every Day Smoker    Packs/day: 1.00    Years: 30.00    Pack years: 30.00    Types: Cigarettes  . Smokeless tobacco: Never Used  Substance and Sexual Activity  . Alcohol use: No  . Drug use: Yes    Types: Marijuana    Comment: 06/13/2017 "not often"  . Sexual activity: Not Currently  Lifestyle  . Physical activity:    Days per week: Not on file    Minutes per session: Not on file  . Stress: Not on file  Relationships  . Social connections:    Talks on phone: Not on file    Gets together: Not on file    Attends religious service: Not on file    Active member of club or organization: Not on file    Attends meetings of clubs or organizations: Not on file    Relationship status: Not on file  . Intimate partner violence:    Fear of current or ex partner: Not on file  Emotionally abused: Not on file    Physically abused: Not on file    Forced sexual activity: Not on file  Other Topics Concern  . Not on file  Social History Narrative  . Not on file    Past Surgical History:  Procedure Laterality Date  . NO PAST SURGERIES      Immunization History  Administered Date(s) Administered  . Influenza,inj,Quad PF,6+ Mos 09/28/2016  . Pneumococcal Polysaccharide-23 06/15/2017    No outpatient medications have been marked as taking for the 05/18/18 encounter (Clinical Support) with [name], FNP.    Allergies  Allergen Reactions  . Lactose Intolerance (Gi) Nausea And Vomiting  . Other Swelling    All beans  . Nitroglycerin Anxiety and Other (See Comments)    Pain and feels like he is on fire    BP (!) 186/116 (BP  Location: Left Arm, Patient Position: Sitting, Cuff Size: Large)   Pulse 74   Ht 5\' 8"  (1.727 m)   SpO2 98%   BMI 30.17 kg/m    Review of Systems  Constitutional: Positive for fatigue (mild ).  HENT: Negative.   Eyes: Negative.   Respiratory: Negative.   Cardiovascular: Positive for leg swelling (mild swelling, warmth, and tederness in lateral lower right leg).  Endocrine: Negative.   Genitourinary: Negative.   Musculoskeletal: Negative.   Skin: Negative.   Allergic/Immunologic: Negative.   Neurological: Positive for headaches (Occasional ).  Hematological: Negative.   Psychiatric/Behavioral: Negative.    Objective:   Physical Exam  Constitutional: He is oriented to person, place, and time. He appears well-developed and well-nourished.    Cardiovascular: Normal rate, regular rhythm, normal heart sounds and intact distal pulses.  Pulmonary/Chest: Effort normal and breath sounds normal.  Abdominal: Soft. Bowel sounds are normal.  Neurological: He is alert and oriented to person, place, and time.  Skin: Skin is warm and dry. Capillary refill takes less than 2 seconds.  Psychiatric: He has a normal mood and affect. His behavior is normal. Judgment and thought content normal.  Nursing note and vitals reviewed.  Assessment & Plan:   1. Hypertension, accelerated Blood pressure continues to be elevated after Clonidine 0.3 mg was administered in office today. He denies visual changes, chest pain, heart palpitations, and falls, dizziness, nausea, vomiting, double visioin, blurred vision, and severe headaches. He will continue antihypertensive mediations as prescribed.   He is advised to report to ED at this time. Patient refused, but states that he will report to ED if he begins to develop symptoms.   - cloNIDine (CATAPRES) tablet 0.2 mg - cloNIDine (CATAPRES) tablet 0.1 mg  2. Bilateral calf pain We will refer him to for US Doppler of lower extremities.  - US Venous Img Lower  Bilateral; Future - VAS [place] LOWER EXTREMITY VENOUS (DVT); Future  3. Follow up He will follow up for blood pressure recheck in 1 week.   Meds ordered this encounter  Medications  . cloNIDine (CATAPRES) tablet 0.2 mg  . cloNIDine (CATAPRES) tablet 0.1 mg    Orders Placed This Encounter  Procedures  . US Venous Img Lower Bilateral   Kathe Becton,  MSN, FNP-C [facility] [address] [place], [place] [address] [phone]

## 2018-05-18 NOTE — ED Notes (Signed)
Pt approached this tech saying that he wants to leave. He said that his blood pressure is going up because he is sitting here and needs to go home to take his BP medication. RN, Roselyn Reef, made aware.

## 2018-05-21 ENCOUNTER — Telehealth: Payer: Self-pay

## 2018-05-21 NOTE — Telephone Encounter (Signed)
Per Butch Penny patient went to ED and they test was run why he was in hospital.

## 2018-05-21 NOTE — Telephone Encounter (Signed)
-----   Message from [name], [place] sent at 05/19/2018  3:57 PM EDT ----- Regarding: "Doppler US for possible DVT" Morey Hummingbird,   Looks like Doppler Ultrasound of lower extremity was not done because of the need for prior authorization. Could you please check into this for patient?    Thanks Morey Hummingbird!

## 2018-05-22 ENCOUNTER — Ambulatory Visit (INDEPENDENT_AMBULATORY_CARE_PROVIDER_SITE_OTHER): Payer: Self-pay | Admitting: Cardiology

## 2018-05-22 ENCOUNTER — Encounter: Payer: Self-pay | Admitting: Cardiology

## 2018-05-22 VITALS — BP 196/114 | HR 71 | Ht 68.0 in | Wt 205.4 lb

## 2018-05-22 DIAGNOSIS — N189 Chronic kidney disease, unspecified: Secondary | ICD-10-CM

## 2018-05-22 DIAGNOSIS — I517 Cardiomegaly: Secondary | ICD-10-CM

## 2018-05-22 DIAGNOSIS — I10 Essential (primary) hypertension: Secondary | ICD-10-CM

## 2018-05-22 MED ORDER — HYDRALAZINE HCL 100 MG PO TABS: 100.0000 mg | ORAL_TABLET | Freq: Three times a day (TID) | ORAL | 3 refills | Status: DC

## 2018-05-22 MED ORDER — METOPROLOL SUCCINATE ER 50 MG PO TB24: 50.0000 mg | ORAL_TABLET | Freq: Every day | ORAL | 3 refills | Status: DC

## 2018-05-22 MED ORDER — CLONIDINE HCL 0.3 MG PO TABS: 0.3000 mg | ORAL_TABLET | Freq: Three times a day (TID) | ORAL | 3 refills | Status: DC

## 2018-05-22 MED ORDER — LABETALOL HCL 200 MG PO TABS: 400.0000 mg | ORAL_TABLET | Freq: Three times a day (TID) | ORAL | 3 refills | Status: DC

## 2018-05-22 MED ORDER — AMLODIPINE BESYLATE 10 MG PO TABS: 10.0000 mg | ORAL_TABLET | Freq: Every day | ORAL | 3 refills | Status: DC

## 2018-05-22 MED ORDER — ISOSORBIDE MONONITRATE ER 30 MG PO TB24: 30.0000 mg | ORAL_TABLET | Freq: Every day | ORAL | 3 refills | Status: DC

## 2018-05-22 MED ORDER — ATORVASTATIN CALCIUM 40 MG PO TABS: 40.0000 mg | ORAL_TABLET | Freq: Every day | ORAL | 3 refills | Status: DC

## 2018-05-22 MED ORDER — GEMFIBROZIL 600 MG PO TABS: 600.0000 mg | ORAL_TABLET | Freq: Two times a day (BID) | ORAL | 3 refills | Status: DC

## 2018-05-22 MED FILL — ISOSORBIDE MN ER 30 MG TAB: 30 | 30 days supply | Qty: 30 | Fill #0

## 2018-05-22 MED FILL — GEMFIBROZIL 600 MG TAB: 600 | 30 days supply | Qty: 60 | Fill #0

## 2018-05-22 MED FILL — METOPROLOL SUCCINATE ER 50: 50 | 30 days supply | Qty: 30 | Fill #0

## 2018-05-22 MED FILL — LABETALOL HCL 200 MG TABLET: 200 | 30 days supply | Qty: 180 | Fill #0

## 2018-05-22 MED FILL — hydrALAZINE HCL 100 MG TABS: 100 | 30 days supply | Qty: 90 | Fill #0

## 2018-05-22 MED FILL — ATORVASTATIN 40 MG TABLET: 40 | 30 days supply | Qty: 30 | Fill #0

## 2018-05-22 MED FILL — AMLODIPINE BESYLATE 10 MG T: 10 | 30 days supply | Qty: 30 | Fill #0

## 2018-05-22 MED FILL — cloNIDine HCL 0.3 MG TABS: 0.3 | 30 days supply | Qty: 90 | Fill #0

## 2018-05-22 NOTE — Patient Instructions (Signed)
Medication Instructions:  The current medical regimen is effective;  continue present plan and medications.  Follow-Up: Follow up in 6 months with Cecilie Kicks, NP  You will receive a letter in the mail 2 months before you are due.  Please call us when you receive this letter to schedule your follow up appointment.  If you need a refill on your cardiac medications before your next appointment, please call your pharmacy.  Thank you for choosing [facility]!!

## 2018-05-22 NOTE — Progress Notes (Signed)
Cardiology Office Note    Date:  05/22/2018   ID:  Rutledge, Selsor 01/02/1976, MRN [id number]  PCP:  [name], FNP  Cardiologist:   Candee Furbish, MD     History of Present Illness:  Stanley [name] is a 42 y.o. male with severe uncontrolled hypertension here for follow-up.  He is here to try to get refills on his medications.  Admitted on 09/26/16 with shortness of breath related to hypertensive emergency with acute kidney injury, nephrology recommended outpatient follow-up and blood pressure management. Mildly elevated troponin noted. Echo demonstrates severe concentric hypertrophy with normal ejection fraction. His blood pressure in the emergency department was 265/168. Smoker. Lives with his brother  Creatinine was 3.9-3.85. Hemoglobin 9.6. Point-of-care troponin was 0.09 but then subsequent draw was 0.07, normal range. Marijuana positive  05/22/2018-overall he remains a symptomatically.  He did have some leg pain, cramping.  Lower extreme Dopplers were negative for DVT.  Reassuring.  Blood pressures were extremely elevated.  We have refilled his medications.  Unfortunately does not have insurance and he does not have a way to pay for his medicines.  His mother on Social Security has been helping him with his medicines.  Denies any strokelike symptoms fevers chills nausea vomiting syncope.   Past Medical History:  Diagnosis Date  . Anxiety   . CKD (chronic kidney disease), stage IV ([place])   . Depression   . Dyslexia   . High cholesterol   . Hypertension   . Stroke ([place]) 06/03/2017   "minor; completely recovered today" (06/13/2017)  . Tobacco abuse     Past Surgical History:  Procedure Laterality Date  . NO PAST SURGERIES      Current Medications: Outpatient Medications Prior to Visit  Medication Sig Dispense Refill  . acetaminophen (TYLENOL) 500 MG tablet Take 1,000 mg by mouth as needed for mild pain.    Marland Kitchen albuterol (PROVENTIL HFA;VENTOLIN HFA) 108 (90 Base)  MCG/ACT inhaler Inhale 2 puffs into the lungs every 4 (four) hours as needed for wheezing or shortness of breath (cough, shortness of breath or wheezing.). 1 Inhaler 1  . aspirin EC 81 MG tablet Take 81 mg by mouth daily.    . furosemide (LASIX) 20 MG tablet Take 1 tablet (20 mg total) by mouth daily as needed. 90 tablet 3  . Multiple Vitamin (MULTIVITAMIN WITH MINERALS) TABS tablet Take 1 tablet by mouth daily as needed.    . nicotine (NICODERM CQ - DOSED IN MG/24 HOURS) 14 mg/24hr patch Place 1 patch (14 mg total) onto the skin daily. 28 patch 0  . potassium chloride SA (K-DUR,KLOR-CON) 20 MEQ tablet TAKE 1 TABLET BY MOUTH DAILY. 30 tablet 3  . traZODone (DESYREL) 50 MG tablet Take 1 tablet (50 mg total) by mouth at bedtime. 90 tablet 1  . amLODipine (NORVASC) 10 MG tablet Take 1 tablet (10 mg total) by mouth daily. 90 tablet 2  . atorvastatin (LIPITOR) 40 MG tablet Take 1 tablet (40 mg total) by mouth daily at 6 PM. 30 tablet 0  . cloNIDine (CATAPRES) 0.3 MG tablet Take 1 tablet (0.3 mg total) by mouth 3 (three) times daily. 90 tablet 1  . gemfibrozil (LOPID) 600 MG tablet Take 600 mg by mouth 2 (two) times daily before a meal.    . hydrALAZINE (APRESOLINE) 100 MG tablet Take 1 tablet (100 mg total) by mouth 3 (three) times daily. 90 tablet 3  . isosorbide mononitrate (IMDUR) 30 MG 24 hr tablet  TAKE 1 TABLET BY MOUTH DAILY. 15 tablet 0  . labetalol (NORMODYNE) 200 MG tablet Take 2 tablets (400 mg total) by mouth 3 (three) times daily. 90 tablet 0  . metoprolol succinate (TOPROL-XL) 50 MG 24 hr tablet Take 50 mg by mouth daily. Take with or immediately following a meal.    . aspirin EC 81 MG tablet Take 1 tablet (81 mg total) by mouth daily as needed for mild pain. (Patient taking differently: Take 81 mg by mouth 2 (two) times daily as needed for mild pain. ) 90 tablet 1  . cloNIDine (CATAPRES) 0.3 MG tablet Take 1 tablet (0.3 mg total) by mouth 3 (three) times daily. (Patient not taking: Reported  on 05/22/2018) 90 tablet 1   No facility-administered medications prior to visit.      Allergies:   Lactose intolerance (gi); Other; and Nitroglycerin   Social History   Socioeconomic History  . Marital status: Married    Spouse name: Not on file  . Number of children: Not on file  . Years of education: Not on file  . Highest education level: Not on file  Occupational History  . Not on file  Social Needs  . Financial resource strain: Not on file  . Food insecurity:    Worry: Not on file    Inability: Not on file  . Transportation needs:    Medical: Not on file    Non-medical: Not on file  Tobacco Use  . Smoking status: Current Every Day Smoker    Packs/day: 1.00    Years: 30.00    Pack years: 30.00    Types: Cigarettes  . Smokeless tobacco: Never Used  Substance and Sexual Activity  . Alcohol use: No  . Drug use: Yes    Types: Marijuana    Comment: 06/13/2017 "not often"  . Sexual activity: Not Currently  Lifestyle  . Physical activity:    Days per week: Not on file    Minutes per session: Not on file  . Stress: Not on file  Relationships  . Social connections:    Talks on phone: Not on file    Gets together: Not on file    Attends religious service: Not on file    Active member of club or organization: Not on file    Attends meetings of clubs or organizations: Not on file    Relationship status: Not on file  Other Topics Concern  . Not on file  Social History Narrative  . Not on file     Family History:  The patient's family history includes Diabetes type II in his brother; Hypertension in his brother and mother.   ROS:   Please see the history of present illness.    Review of Systems  All other systems reviewed and are negative.    PHYSICAL EXAM:   VS:  BP (!) 196/114   Pulse 71   Ht 5\' 8"  (1.727 m)   Wt 205 lb 6.4 oz (93.2 kg)   SpO2 97%   BMI 31.23 kg/m    GEN: Well nourished, well developed, in no acute distress  HEENT: normal  Neck: no  JVD, carotid bruits, or masses Cardiac: RRR; no murmurs, rubs, or gallops,no edema  Respiratory:  clear to auscultation bilaterally, normal work of breathing GI: soft, nontender, nondistended, + BS MS: no deformity or atrophy  Skin: warm and dry, no rash Neuro:  Alert and Oriented x 3, Strength and sensation are intact Psych: euthymic mood, full  affect   Wt Readings from Last 3 Encounters:  05/22/18 205 lb 6.4 oz (93.2 kg)  03/01/18 198 lb 6.4 oz (90 kg)  02/14/18 195 lb 4.8 oz (88.6 kg)      Studies/Labs Reviewed:   EKG:  09/27/16-sinus rhythm, LVH, nonspecific ST-T wave changes  Recent Labs: 02/11/2018: Magnesium 2.0 02/12/2018: B Natriuretic Peptide 207.6 03/01/2018: ALT 10; TSH 1.310 05/18/2018: BUN 32; Creatinine, Ser 3.24; Hemoglobin 13.0; Platelets 263; Potassium 4.0; Sodium 137   Lipid Panel    Component Value Date/Time   CHOL 96 (L) 03/01/2018 1356   TRIG 62 03/01/2018 1356   HDL 40 03/01/2018 1356   CHOLHDL 2.4 03/01/2018 1356   CHOLHDL 4.3 09/27/2016 0301   VLDL 14 09/27/2016 0301   LDLCALC 44 03/01/2018 1356    Additional studies/ records that were reviewed today include:  ECHO 09/28/16  - There is severe concentric left ventricular hypertrophy with   grade 2 diastolic dysfunction and elevated filling pressures and   severe left atrial enlargement.    ASSESSMENT:    1. LVH (left ventricular hypertrophy)   2. Hypertension, accelerated   3. Chronic kidney disease, unspecified CKD stage   4. Uncontrolled hypertension      PLAN:  In order of problems listed above:  Severe hypertension with recent hypertensive urgency  -Sent to the emergency department on 05/18/2018 with elevated blood pressure by his primary care physician.  191/115.  - Left ventricular hypertrophy noted on echocardiogram  - With creatinine of 3.9, continue close follow-up with nephrology, Dr. [name].  - No further cardiology workup necessary at this time.  - Previously, one  isolated point-of-care troponin was abnormal which certainly could be seen in the setting of this extremely elevated blood pressure. His subsequent troponins were within normal range.  - He did not have low voltage on his ECG personally viewed. Severe concentric left ventricular hypertrophy is secondary to uncontrolled hypertension. I would not suspect amyloidosis as a cause.  - Continue to Encourage follow up with nephrology, Dr. Wille Glaser. Established with primary care, [facility].  Chronic kidney disease stage IV  - Hypertensive etiology  - Avoiding ACE inhibitor is, ARB, spironolactone because of this.  - Dr. [name] has been seeing.  He is very [employer] attorney.  Family history of hypertension  - Aneurysm in family, hypertension etc.  6 month follow up, APP   Medication Adjustments/Labs and Tests Ordered: Current medicines are reviewed at length with the patient today.  Concerns regarding medicines are outlined above.  Medication changes, Labs and Tests ordered today are listed in the Patient Instructions below. Patient Instructions  Medication Instructions:  The current medical regimen is effective;  continue present plan and medications.  Follow-Up: Follow up in 6 months with Cecilie Kicks, NP  You will receive a letter in the mail 2 months before you are due.  Please call us when you receive this letter to schedule your follow up appointment.  If you need a refill on your cardiac medications before your next appointment, please call your pharmacy.  Thank you for choosing [facility]!!        Signed, Candee Furbish, MD  05/22/2018 5:02 PM    [facility] [address], [place], [place]  [address] Phone: [phone]; Fax: [phone]

## 2018-05-23 ENCOUNTER — Telehealth: Payer: Self-pay | Admitting: Cardiology

## 2018-05-23 NOTE — Telephone Encounter (Signed)
New Message:   [facility]ley [name] is questioning if the pt should be on both Beta Blockers Medications

## 2018-05-23 NOTE — Telephone Encounter (Signed)
Reviewed medication list with Dr Marlou Porch who states patient does need to be on both Metoprolol and Labetalol.  Ethan aware.

## 2018-05-24 MED FILL — traZODone HCL 50 MG TABS: 50 | 30 days supply | Qty: 30 | Fill #5

## 2018-06-08 ENCOUNTER — Ambulatory Visit (INDEPENDENT_AMBULATORY_CARE_PROVIDER_SITE_OTHER): Payer: Self-pay | Admitting: Family Medicine

## 2018-06-08 ENCOUNTER — Encounter: Payer: Self-pay | Admitting: Family Medicine

## 2018-06-08 VITALS — BP 162/80 | HR 80 | Temp 98.7°F | Ht 68.0 in | Wt 208.0 lb

## 2018-06-08 DIAGNOSIS — F419 Anxiety disorder, unspecified: Secondary | ICD-10-CM

## 2018-06-08 DIAGNOSIS — Z23 Encounter for immunization: Secondary | ICD-10-CM

## 2018-06-08 DIAGNOSIS — F17200 Nicotine dependence, unspecified, uncomplicated: Secondary | ICD-10-CM

## 2018-06-08 DIAGNOSIS — G4700 Insomnia, unspecified: Secondary | ICD-10-CM

## 2018-06-08 DIAGNOSIS — R05 Cough: Secondary | ICD-10-CM

## 2018-06-08 DIAGNOSIS — R059 Cough, unspecified: Secondary | ICD-10-CM

## 2018-06-08 DIAGNOSIS — Z09 Encounter for follow-up examination after completed treatment for conditions other than malignant neoplasm: Secondary | ICD-10-CM

## 2018-06-08 DIAGNOSIS — I159 Secondary hypertension, unspecified: Secondary | ICD-10-CM

## 2018-06-08 LAB — POCT URINALYSIS DIP (MANUAL ENTRY)
Bilirubin, UA: NEGATIVE
Blood, UA: NEGATIVE
Glucose, UA: NEGATIVE mg/dL
Ketones, POC UA: NEGATIVE mg/dL
Nitrite, UA: NEGATIVE
Protein Ur, POC: 100 mg/dL — AB
Spec Grav, UA: 1.025 (ref 1.010–1.025)
Urobilinogen, UA: 0.2 E.U./dL
pH, UA: 5.5 (ref 5.0–8.0)

## 2018-06-08 MED ORDER — TRAZODONE HCL 100 MG PO TABS: 100.0000 mg | ORAL_TABLET | Freq: Every evening | ORAL | 3 refills | Status: DC | PRN

## 2018-06-08 NOTE — Progress Notes (Signed)
Follow Up  Subjective:    Patient ID: Stanley [name], male    DOB: 1976-02-15, 42 y.o.   MRN: [id number]   Chief Complaint  Patient presents with  . Follow-up    Hypertension    HPI  Stanley [name] is a 42 year old male with a past medical history of Tobacco Abuse, Stroke, Hypertension, Hyperlipidemia, Dyslexia, Depression, CKD, and Anxiety. He is here today for follow up.    Current Status: Since his last office visit, he has had occasional pain and discomfort in left neck area which radiated up to his right occipital lobe. He reports moderate fatigue, and shortness of breath on exertion. He continues to smoke 1/2 pack of cigarettes a day. He has not been able afford Nicotine Patches at this time. He denies visual changes, chest pain, heart palpitations, and falls. Hehas occasionally headaches and dizziness with position changes. Denies severe headaches, confusion, seizures, double vision, and blurred vision, nausea and vomiting. He states that he has not been sleeping well.   She denies fevers, chills,  recent infections, weight loss, and night sweats. She has not had any headaches, visual changes, and falls.  No reports of GI problems such as nausea, vomiting, diarrhea, and constipation. She has no reports of blood in stools, dysuria and hematuria. No depression or anxiety reported.   Past Medical History:  Diagnosis Date  . Anxiety   . CKD (chronic kidney disease), stage IV ([place])   . Depression   . Dyslexia   . High cholesterol   . Hypertension   . Stroke ([place]) 06/03/2017   "minor; completely recovered today" (06/13/2017)  . Tobacco abuse     Family History  Problem Relation Age of Onset  . Hypertension Mother   . Hypertension Brother   . Diabetes type II Brother     Social History   Socioeconomic History  . Marital status: Married    Spouse name: Not on file  . Number of children: Not on file  . Years of education: Not on file  . Highest education level: Not on file   Occupational History  . Not on file  Social Needs  . Financial resource strain: Not on file  . Food insecurity:    Worry: Not on file    Inability: Not on file  . Transportation needs:    Medical: Not on file    Non-medical: Not on file  Tobacco Use  . Smoking status: Current Every Day Smoker    Packs/day: 1.00    Years: 30.00    Pack years: 30.00    Types: Cigarettes  . Smokeless tobacco: Never Used  Substance and Sexual Activity  . Alcohol use: No  . Drug use: Yes    Types: Marijuana    Comment: 06/13/2017 "not often"  . Sexual activity: Not Currently  Lifestyle  . Physical activity:    Days per week: Not on file    Minutes per session: Not on file  . Stress: Not on file  Relationships  . Social connections:    Talks on phone: Not on file    Gets together: Not on file    Attends religious service: Not on file    Active member of club or organization: Not on file    Attends meetings of clubs or organizations: Not on file    Relationship status: Not on file  . Intimate partner violence:    Fear of current or ex partner: Not on file    Emotionally  abused: Not on file    Physically abused: Not on file    Forced sexual activity: Not on file  Other Topics Concern  . Not on file  Social History Narrative  . Not on file    Past Surgical History:  Procedure Laterality Date  . NO PAST SURGERIES      Immunization History  Administered Date(s) Administered  . Influenza,inj,Quad PF,6+ Mos 09/28/2016, 06/08/2018  . Pneumococcal Polysaccharide-23 06/15/2017   Current Meds  Medication Sig  . acetaminophen (TYLENOL) 500 MG tablet Take 1,000 mg by mouth as needed for mild pain.  Marland Kitchen albuterol (PROVENTIL HFA;VENTOLIN HFA) 108 (90 Base) MCG/ACT inhaler Inhale 2 puffs into the lungs every 4 (four) hours as needed for wheezing or shortness of breath (cough, shortness of breath or wheezing.).  Marland Kitchen amLODipine (NORVASC) 10 MG tablet Take 1 tablet (10 mg total) by mouth daily.  Marland Kitchen  aspirin EC 81 MG tablet Take 81 mg by mouth daily.  Marland Kitchen atorvastatin (LIPITOR) 40 MG tablet Take 1 tablet (40 mg total) by mouth daily at 6 PM.  . cloNIDine (CATAPRES) 0.3 MG tablet Take 1 tablet (0.3 mg total) by mouth 3 (three) times daily.  . furosemide (LASIX) 20 MG tablet Take 1 tablet (20 mg total) by mouth daily as needed.  Marland Kitchen gemfibrozil (LOPID) 600 MG tablet Take 1 tablet (600 mg total) by mouth 2 (two) times daily before a meal.  . hydrALAZINE (APRESOLINE) 100 MG tablet Take 1 tablet (100 mg total) by mouth 3 (three) times daily.  . isosorbide mononitrate (IMDUR) 30 MG 24 hr tablet Take 1 tablet (30 mg total) by mouth daily.  Marland Kitchen labetalol (NORMODYNE) 200 MG tablet Take 2 tablets (400 mg total) by mouth 3 (three) times daily.  . metoprolol succinate (TOPROL-XL) 50 MG 24 hr tablet Take 1 tablet (50 mg total) by mouth daily. Take with or immediately following a meal.  . Multiple Vitamin (MULTIVITAMIN WITH MINERALS) TABS tablet Take 1 tablet by mouth daily as needed.  . potassium chloride SA (K-DUR,KLOR-CON) 20 MEQ tablet TAKE 1 TABLET BY MOUTH DAILY.  . [DISCONTINUED] traZODone (DESYREL) 50 MG tablet Take 1 tablet (50 mg total) by mouth at bedtime.    Allergies  Allergen Reactions  . Lactose Intolerance (Gi) Nausea And Vomiting  . Other Swelling    All beans  . Nitroglycerin Anxiety and Other (See Comments)    Pain and feels like he is on fire   BP (!) 162/80 (BP Location: Left Arm, Patient Position: Sitting, Cuff Size: Small)   Pulse 80   Temp 98.7 F (37.1 C) (Oral)   Ht 5\' 8"  (1.727 m)   Wt 208 lb (94.3 kg)   SpO2 98%   BMI 31.63 kg/m    Review of Systems  Constitutional: Negative.   HENT: Negative.   Eyes: Negative.   Respiratory: Negative.   Cardiovascular: Negative.   Gastrointestinal: Negative.   Endocrine: Negative.   Genitourinary: Negative.   Musculoskeletal: Negative.   Skin: Negative.   Allergic/Immunologic: Negative.   Neurological: Positive for dizziness  (Occasional ) and headaches (Occasional).  Hematological: Negative.   Psychiatric/Behavioral: Negative.    Objective:   Physical Exam  Constitutional: He is oriented to person, place, and time. He appears well-developed and well-nourished.  HENT:  Head: Normocephalic and atraumatic.  Right Ear: External ear normal.  Left Ear: External ear normal.  Nose: Nose normal.  Mouth/Throat: Oropharynx is clear and moist.  Eyes: Pupils are equal, round, and reactive to  light. Conjunctivae and EOM are normal.  Neck: Normal range of motion. Neck supple.  Cardiovascular: Normal rate, regular rhythm, normal heart sounds and intact distal pulses.  Pulmonary/Chest: Effort normal.  Abdominal: Soft. Bowel sounds are normal.  Musculoskeletal: Normal range of motion.  Neurological: He is alert and oriented to person, place, and time.  Skin: Skin is warm and dry.  Psychiatric: He has a normal mood and affect. His behavior is normal. Judgment and thought content normal.  Nursing note and vitals reviewed.  Assessment & Plan:   1. Secondary hypertension Much improved. Blood pressure is stable at 168/80 today. He will continue antihypertensive medications as prescribed.   2. Anxiety Mild today.   3. Smoker He continues to smoke 1/2 pack of cigarettes daily.  4. Cough Stable.  5. Insomnia, unspecified type We will increase Trazodone to 100 mg.  - traZODone (DESYREL) 100 MG tablet; Take 1 tablet (100 mg total) by mouth at bedtime as needed for sleep.  Dispense: 30 tablet; Refill: 3  6. Need for immunization against influenza - Flu Vaccine QUAD 36+ mos IM  7. Follow up He will follow up in 1 month to assess chronic conditions and re-evaluate effectiveness of Trazodone.   - POCT urinalysis dipstick  Meds ordered this encounter  Medications  . traZODone (DESYREL) 100 MG tablet    Sig: Take 1 tablet (100 mg total) by mouth at bedtime as needed for sleep.    Dispense:  30 tablet    Refill:  [place],  MSN, FNP-C Patient [facility] [address] [place], [place] [address] [phone]

## 2018-06-18 ENCOUNTER — Other Ambulatory Visit: Payer: Self-pay | Admitting: Family Medicine

## 2018-06-18 ENCOUNTER — Ambulatory Visit: Payer: Self-pay | Admitting: Family Medicine

## 2018-06-18 MED FILL — METOPROLOL SUCCINATE ER 50: 50 | 30 days supply | Qty: 30 | Fill #1

## 2018-06-18 MED FILL — AMLODIPINE BESYLATE 10 MG T: 10 | 30 days supply | Qty: 30 | Fill #1

## 2018-06-18 MED FILL — ATORVASTATIN 40 MG TABLET: 40 | 30 days supply | Qty: 30 | Fill #1

## 2018-06-18 MED FILL — cloNIDine HCL 0.3 MG TABS: 0.3 | 30 days supply | Qty: 90 | Fill #1

## 2018-06-18 MED FILL — hydrALAZINE HCL 100 MG TABS: 100 | 30 days supply | Qty: 90 | Fill #1

## 2018-06-18 MED FILL — GEMFIBROZIL 600 MG TAB: 600 | 30 days supply | Qty: 60 | Fill #1

## 2018-06-18 MED FILL — LABETALOL HCL 200 MG TABLET: 200 | 30 days supply | Qty: 180 | Fill #1

## 2018-06-18 NOTE — Telephone Encounter (Signed)
Patient called and stated that he was urinating blood. Patient was advise to come in office today for evaluation and was scheduled for 1120am.

## 2018-06-19 ENCOUNTER — Ambulatory Visit (INDEPENDENT_AMBULATORY_CARE_PROVIDER_SITE_OTHER): Payer: Self-pay | Admitting: Family Medicine

## 2018-06-19 ENCOUNTER — Encounter: Payer: Self-pay | Admitting: Family Medicine

## 2018-06-19 VITALS — BP 168/90 | HR 68 | Temp 97.9°F | Ht 68.0 in | Wt 208.0 lb

## 2018-06-19 DIAGNOSIS — Z09 Encounter for follow-up examination after completed treatment for conditions other than malignant neoplasm: Secondary | ICD-10-CM

## 2018-06-19 DIAGNOSIS — R109 Unspecified abdominal pain: Secondary | ICD-10-CM

## 2018-06-19 DIAGNOSIS — R8290 Unspecified abnormal findings in urine: Secondary | ICD-10-CM

## 2018-06-19 DIAGNOSIS — R319 Hematuria, unspecified: Secondary | ICD-10-CM

## 2018-06-19 DIAGNOSIS — R10A3 Flank pain, bilateral: Secondary | ICD-10-CM

## 2018-06-19 DIAGNOSIS — I159 Secondary hypertension, unspecified: Secondary | ICD-10-CM

## 2018-06-19 DIAGNOSIS — G4700 Insomnia, unspecified: Secondary | ICD-10-CM

## 2018-06-19 DIAGNOSIS — F419 Anxiety disorder, unspecified: Secondary | ICD-10-CM

## 2018-06-19 DIAGNOSIS — N184 Chronic kidney disease, stage 4 (severe): Secondary | ICD-10-CM

## 2018-06-19 LAB — POCT URINALYSIS DIP (MANUAL ENTRY)
Bilirubin, UA: NEGATIVE
Blood, UA: NEGATIVE
Glucose, UA: NEGATIVE mg/dL
Ketones, POC UA: NEGATIVE mg/dL
Leukocytes, UA: NEGATIVE
Nitrite, UA: NEGATIVE
Protein Ur, POC: >=300 mg/dL — AB
Spec Grav, UA: 1.025 (ref 1.010–1.025)
Urobilinogen, UA: 0.2 E.U./dL
pH, UA: 6 (ref 5.0–8.0)

## 2018-06-19 MED ORDER — KETOROLAC TROMETHAMINE 60 MG/2ML IM SOLN
60.0000 mg | Freq: Once | INTRAMUSCULAR | Status: AC
  Administered 2018-06-19: 60 mg via INTRAMUSCULAR

## 2018-06-19 MED FILL — traZODone HCL 100 MG TABS: 100 | 30 days supply | Qty: 30 | Fill #0

## 2018-06-19 NOTE — Progress Notes (Signed)
Sick Visit  Subjective:    Patient ID: Stanley [name], male    DOB: 1976/07/30, 42 y.o.   MRN: [id number]   Chief Complaint  Patient presents with  . Hematuria  . Back Pain   HPI  Stanley [name] is a 42 year old male with a past medical history of Tobacco Abuse, Stroke, Hypertension, Hyperlipidemia, Dyslexia, Depression, CKD, and Anxiety.   Current Status: Since his last office visit, he recently moved furniture for his mother to another home. Afterwards, he noticed blood-tinged urine, and pain in his flank area. He is currently taking Acetaminophen and Motrin for back pain back. He has not noticed blood in his urine since that episode, but he continues to have flank pain. He has no reports of blood in stools, dysuria and hematuria since that episode. He denies fevers, chills, fatigue, recent infections, weight loss, and night sweats. He has not had any headaches, visual changes, dizziness, and falls. No chest pain, heart palpitations, cough and shortness of breath reported. No reports of GI problems such as nausea, vomiting, diarrhea, and constipation.  No depression or anxiety reported.    He is accompanied today by his mother and brother.   Review of Systems  Respiratory: Negative.   Cardiovascular: Negative.   Gastrointestinal: Negative.   Genitourinary: Positive for hematuria.  Musculoskeletal: Positive for arthralgias (generalized).  Skin: Negative.   Neurological: Negative.   Psychiatric/Behavioral: Negative.    Objective:   Physical Exam  Constitutional: He is oriented to person, place, and time. He appears well-developed and well-nourished.  Cardiovascular: Normal rate, regular rhythm, normal heart sounds and intact distal pulses.  Pulmonary/Chest: Effort normal and breath sounds normal.  Abdominal: Soft. Bowel sounds are normal.  Musculoskeletal: Normal range of motion.  Neurological: He is alert and oriented to person, place, and time.  Skin: Skin is warm and dry.  Psychiatric:  He has a normal mood and affect. His behavior is normal. Judgment and thought content normal.  Nursing note and vitals reviewed.  Assessment & Plan:   1. Hematuria, unspecified type Stable. So far, he has only had 1 incident of hematuria. He is advised to report to office if recurrence. Urinalysis was negative or blood today. Urine culture is pending. Will will follow up in 2 weeks.   2. Bilateral flank pain He will receive Toradol injection in office today. He will continue pain meds sparingly,as needed.  - ketorolac (TORADOL) injection 60 mg  3. CKD (chronic kidney disease) stage 4, GFR 15-29 ml/min (HCC) He continues to follow up with Nephrologist regularly.   4. Anxiety Stable situational anxiety about health problems. We will continue to monitor.   5. Abnormal urinalysis - Urine Culture  6. Secondary hypertension Improving. Blood pressure is at 168/90 today, from 196/114 on 05/22/2018.  He will continue antihypertensive medications as prescribed.   7. Insomnia He will continue Trazodone as prescribed.   8. Follow up He will follow up in 2 weeks.  - POCT urinalysis dipstick  Meds ordered this encounter  Medications  . ketorolac (TORADOL) injection 60 mg    Kathe Becton,  MSN, FNP-C Patient [facility] [address] [place], [place] [address] [phone]

## 2018-06-21 LAB — URINE CULTURE

## 2018-07-02 ENCOUNTER — Ambulatory Visit: Payer: Self-pay | Admitting: Family Medicine

## 2018-07-12 ENCOUNTER — Telehealth: Payer: Self-pay

## 2018-07-12 NOTE — Telephone Encounter (Signed)
Patient states that he will give [place] a call back by the end of the day to let us know if him and Teven will make appointment.

## 2018-07-13 ENCOUNTER — Ambulatory Visit: Payer: Self-pay | Admitting: Family Medicine

## 2018-08-03 MED FILL — cloNIDine HCL 0.3 MG TABS: 0.3 | 30 days supply | Qty: 90 | Fill #2

## 2018-08-03 MED FILL — hydrALAZINE HCL 100 MG TABS: 100 | 30 days supply | Qty: 90 | Fill #2

## 2018-08-03 MED FILL — LABETALOL HCL 200 MG TABLET: 200 | 30 days supply | Qty: 180 | Fill #2

## 2018-08-03 MED FILL — traZODone HCL 100 MG TABS: 100 | 30 days supply | Qty: 30 | Fill #1

## 2018-08-03 MED FILL — GEMFIBROZIL 600 MG TAB: 600 | 30 days supply | Qty: 60 | Fill #2

## 2018-08-03 MED FILL — ATORVASTATIN 40 MG TABLET: 40 | 30 days supply | Qty: 30 | Fill #2

## 2018-08-03 MED FILL — AMLODIPINE BESYLATE 10 MG T: 10 | 30 days supply | Qty: 30 | Fill #2

## 2018-08-03 MED FILL — METOPROLOL SUCCINATE ER 50: 50 | 30 days supply | Qty: 30 | Fill #2

## 2018-08-14 ENCOUNTER — Emergency Department (HOSPITAL_COMMUNITY): Admission: EM | Admit: 2018-08-14 | Discharge: 2018-08-14 | Discharge status: Absent without leave | Payer: Self-pay

## 2018-08-14 ENCOUNTER — Encounter: Payer: Self-pay | Admitting: Family Medicine

## 2018-08-14 ENCOUNTER — Ambulatory Visit (HOSPITAL_COMMUNITY)
Admission: RE | Admit: 2018-08-14 | Discharge: 2018-08-14 | Disposition: A | Discharge status: Home / Self Care | Payer: Self-pay | Source: Ambulatory Visit | Attending: Family Medicine | Admitting: Family Medicine

## 2018-08-14 ENCOUNTER — Telehealth: Payer: Self-pay

## 2018-08-14 ENCOUNTER — Ambulatory Visit (INDEPENDENT_AMBULATORY_CARE_PROVIDER_SITE_OTHER): Payer: Self-pay | Admitting: Family Medicine

## 2018-08-14 VITALS — BP 186/96 | HR 66 | Temp 97.9°F | Ht 68.0 in | Wt 208.0 lb

## 2018-08-14 DIAGNOSIS — Z7689 Persons encountering health services in other specified circumstances: Secondary | ICD-10-CM | POA: Insufficient documentation

## 2018-08-14 DIAGNOSIS — Z09 Encounter for follow-up examination after completed treatment for conditions other than malignant neoplasm: Secondary | ICD-10-CM

## 2018-08-14 DIAGNOSIS — I10 Essential (primary) hypertension: Secondary | ICD-10-CM | POA: Insufficient documentation

## 2018-08-14 DIAGNOSIS — M79604 Pain in right leg: Secondary | ICD-10-CM

## 2018-08-14 NOTE — Progress Notes (Signed)
Right lower extremity venous duplex has been completed. Negative for DVT. Results were given to [facility] at [place] office.  08/14/18 3:27 PM Carlos Levering RVT

## 2018-08-14 NOTE — Progress Notes (Signed)
Sick Visit  Subjective:    Patient ID: Stanley [name], male    DOB: 1976-03-26, 42 y.o.   MRN: [id number] Chief Complaint  Patient presents with  . Leg Pain    right  . Leg Swelling    right knee   HPI  Stanley [name] is a 42 year old male with a past medical history of Tobacco Use, Stroke, Hypertension, Hypertensive Urgency, Hyperlipidemia, Dyslexia, Depression, CKD, and Anxiety. He is here today for a Sick Visit.   Current Status: Since his last office visit, he has pain, tenderness, swelling, erythema, and warmth in right lower extremity X 1 week now. He states that he is currently taking all medications as prescribed. He denies visual changes, chest pain, shortness of breath, heart palpitations, and falls. He has occasionally headaches and dizziness with position changes. Denies severe headaches, confusion, seizures, double vision, and blurred vision, nausea and vomiting.   She denies fevers, chills, recent infections, weight loss, and night sweats. No reports of GI problems such as diarrhea, and constipation. She has no reports of blood in stools, dysuria and hematuria. No depression or anxiety reported.    Past Medical History:  Diagnosis Date  . Anxiety   . CKD (chronic kidney disease), stage IV ([place])   . Depression   . Dyslexia   . High cholesterol   . Hypertension   . Hypertensive urgency   . Stroke ([place]) 06/03/2017   "minor; completely recovered today" (06/13/2017)  . Tobacco abuse     Family History  Problem Relation Age of Onset  . Hypertension Mother   . Hypertension Brother   . Diabetes type II Brother     Social History   Socioeconomic History  . Marital status: Married    Spouse name: Not on file  . Number of children: Not on file  . Years of education: Not on file  . Highest education level: Not on file  Occupational History  . Not on file  Social Needs  . Financial resource strain: Not on file  . Food insecurity:    Worry: Not on file    Inability: Not  on file  . Transportation needs:    Medical: Not on file    Non-medical: Not on file  Tobacco Use  . Smoking status: Current Every Day Smoker    Packs/day: 1.00    Years: 30.00    Pack years: 30.00    Types: Cigarettes  . Smokeless tobacco: Never Used  Substance and Sexual Activity  . Alcohol use: No  . Drug use: Yes    Types: Marijuana    Comment: 06/13/2017 "not often"  . Sexual activity: Not Currently  Lifestyle  . Physical activity:    Days per week: Not on file    Minutes per session: Not on file  . Stress: Not on file  Relationships  . Social connections:    Talks on phone: Not on file    Gets together: Not on file    Attends religious service: Not on file    Active member of club or organization: Not on file    Attends meetings of clubs or organizations: Not on file    Relationship status: Not on file  . Intimate partner violence:    Fear of current or ex partner: Not on file    Emotionally abused: Not on file    Physically abused: Not on file    Forced sexual activity: Not on file  Other Topics Concern  .  Not on file  Social History Narrative  . Not on file    Past Surgical History:  Procedure Laterality Date  . NO PAST SURGERIES      Immunization History  Administered Date(s) Administered  . Influenza,inj,Quad PF,6+ Mos 09/28/2016, 06/08/2018  . Pneumococcal Polysaccharide-23 06/15/2017    Current Meds  Medication Sig  . acetaminophen (TYLENOL) 500 MG tablet Take 1,000 mg by mouth as needed for mild pain.  Marland Kitchen albuterol (PROVENTIL HFA;VENTOLIN HFA) 108 (90 Base) MCG/ACT inhaler Inhale 2 puffs into the lungs every 4 (four) hours as needed for wheezing or shortness of breath (cough, shortness of breath or wheezing.).  Marland Kitchen amLODipine (NORVASC) 10 MG tablet Take 1 tablet (10 mg total) by mouth daily.  Marland Kitchen aspirin EC 81 MG tablet Take 81 mg by mouth daily.  Marland Kitchen atorvastatin (LIPITOR) 40 MG tablet Take 1 tablet (40 mg total) by mouth daily at 6 PM.  . cloNIDine  (CATAPRES) 0.3 MG tablet Take 1 tablet (0.3 mg total) by mouth 3 (three) times daily.  . furosemide (LASIX) 20 MG tablet Take 1 tablet (20 mg total) by mouth daily as needed.  Marland Kitchen gemfibrozil (LOPID) 600 MG tablet Take 1 tablet (600 mg total) by mouth 2 (two) times daily before a meal.  . hydrALAZINE (APRESOLINE) 100 MG tablet Take 1 tablet (100 mg total) by mouth 3 (three) times daily.  . isosorbide mononitrate (IMDUR) 30 MG 24 hr tablet Take 1 tablet (30 mg total) by mouth daily.  Marland Kitchen labetalol (NORMODYNE) 200 MG tablet Take 2 tablets (400 mg total) by mouth 3 (three) times daily.  . metoprolol succinate (TOPROL-XL) 50 MG 24 hr tablet Take 1 tablet (50 mg total) by mouth daily. Take with or immediately following a meal.  . Multiple Vitamin (MULTIVITAMIN WITH MINERALS) TABS tablet Take 1 tablet by mouth daily as needed.  . nicotine (NICODERM CQ - DOSED IN MG/24 HOURS) 14 mg/24hr patch Place 1 patch (14 mg total) onto the skin daily.  . potassium chloride SA (K-DUR,KLOR-CON) 20 MEQ tablet TAKE 1 TABLET BY MOUTH DAILY.  . traZODone (DESYREL) 100 MG tablet Take 1 tablet (100 mg total) by mouth at bedtime as needed for sleep.    Allergies  Allergen Reactions  . Lactose Intolerance (Gi) Nausea And Vomiting  . Other Swelling    All beans  . Nitroglycerin Anxiety and Other (See Comments)    Pain and feels like he is on fire    BP (!) 186/96 (BP Location: Left Arm, Patient Position: Sitting, Cuff Size: Small)   Pulse 66   Temp 97.9 F (36.6 C) (Oral)   Ht 5\' 8"  (1.727 m)   Wt 208 lb (94.3 kg)   SpO2 97%   BMI 31.63 kg/m    Review of Systems  Constitutional: Negative.   HENT: Negative.   Eyes: Negative.   Respiratory: Negative.   Cardiovascular: Negative.   Gastrointestinal: Negative.   Genitourinary: Negative.   Musculoskeletal:       Right leg pain, swelling, and warmth X 1 week.  Skin: Negative.   Neurological: Positive for dizziness and headaches.  Psychiatric/Behavioral:  Negative.    Objective:   Physical Exam  Constitutional: He is oriented to person, place, and time. He appears well-developed and well-nourished.  HENT:  Head: Normocephalic and atraumatic.  Eyes: Pupils are equal, round, and reactive to light. Conjunctivae and EOM are normal.  Neck: Normal range of motion. Neck supple.  Cardiovascular: Normal rate, regular rhythm, normal heart sounds and  intact distal pulses.  Pulmonary/Chest: Effort normal and breath sounds normal.  Abdominal: Soft. Bowel sounds are normal.  Musculoskeletal: Normal range of motion. He exhibits edema (right leg).  Neurological: He is alert and oriented to person, place, and time.  Skin: Skin is warm. There is erythema.  Psychiatric: He has a normal mood and affect. His behavior is normal. Judgment and thought content normal.  Nursing note and vitals reviewed.  Assessment & Plan:   1. Acute leg pain, right  2. Encounter for assessment for deep vein thrombosis (DVT) Patient will report Radiology for Stat Ultrasound today.  - US ARTERIAL LOWER EXTREMITY DUPLEX BILATERAL; Future - VAS [place] LOWER EXTREMITY VENOUS (DVT); Future  3. Accelerated hypertension Blood pressure is elevated today. Probable contributed to his increased pain. Patient to be assessed for DVT stat. He will continue antihypertensive medications as prescribed. He will report to ED for chest pain, cough, shortness of breath, severe headaches, dizziness, confusion, seizures, double vision, and blurred vision, nausea and vomiting. She will continue to decrease high sodium intake, excessive alcohol intake, increase potassium intake, smoking cessation, and increase physical activity of at least 30 minutes of cardio activity daily. She will continue to follow Heart Healthy or DASH diet. - POCT urinalysis dipstick - VAS [place] LOWER EXTREMITY VENOUS (DVT); Future  4. Follow up He will follow up on 09/12/2018.  Orders Placed This Encounter  Procedures  . US  ARTERIAL LOWER EXTREMITY DUPLEX BILATERAL  . POCT urinalysis dipstick   Kathe Becton,  MSN, FNP-C Patient [facility] [address] [place] [address] [place], [place] [address] [phone]

## 2018-08-14 NOTE — Telephone Encounter (Signed)
Patient was negative for DVT. Patient wants to know what will be sent into pharmacy for pain. Please call patient at [phone]

## 2018-08-15 ENCOUNTER — Other Ambulatory Visit: Payer: Self-pay | Admitting: Family Medicine

## 2018-08-15 ENCOUNTER — Telehealth: Payer: Self-pay

## 2018-08-15 DIAGNOSIS — M13161 Monoarthritis, not elsewhere classified, right knee: Secondary | ICD-10-CM

## 2018-08-15 DIAGNOSIS — M25561 Pain in right knee: Secondary | ICD-10-CM

## 2018-08-15 MED ORDER — PREDNISONE 10 MG PO TABS: ORAL_TABLET | ORAL | 0 refills | Status: DC

## 2018-08-15 MED ORDER — IBUPROFEN 800 MG PO TABS: 800.0000 mg | ORAL_TABLET | Freq: Three times a day (TID) | ORAL | 2 refills | Status: DC | PRN

## 2018-08-15 NOTE — Telephone Encounter (Signed)
Patient notified that Ibuprofen and Predisone have been sent to the pharmacy and to follow up in 2 weeks.

## 2018-08-16 MED FILL — predniSONE 10 MG TABS: 10 | 6 days supply | Qty: 21 | Fill #0

## 2018-08-19 ENCOUNTER — Encounter: Payer: Self-pay | Admitting: Family Medicine

## 2018-09-11 ENCOUNTER — Encounter: Payer: Self-pay | Admitting: Family Medicine

## 2018-09-11 ENCOUNTER — Ambulatory Visit (INDEPENDENT_AMBULATORY_CARE_PROVIDER_SITE_OTHER): Payer: Self-pay | Admitting: Family Medicine

## 2018-09-11 VITALS — BP 176/96 | HR 72 | Temp 98.1°F | Resp 16 | Ht 68.0 in | Wt 210.0 lb

## 2018-09-11 DIAGNOSIS — I10 Essential (primary) hypertension: Secondary | ICD-10-CM

## 2018-09-11 DIAGNOSIS — F418 Other specified anxiety disorders: Secondary | ICD-10-CM

## 2018-09-11 DIAGNOSIS — M13161 Monoarthritis, not elsewhere classified, right knee: Secondary | ICD-10-CM

## 2018-09-11 DIAGNOSIS — M25561 Pain in right knee: Secondary | ICD-10-CM

## 2018-09-11 DIAGNOSIS — Z09 Encounter for follow-up examination after completed treatment for conditions other than malignant neoplasm: Secondary | ICD-10-CM

## 2018-09-11 DIAGNOSIS — M7989 Other specified soft tissue disorders: Secondary | ICD-10-CM

## 2018-09-11 LAB — POCT URINALYSIS DIPSTICK
Bilirubin, UA: NEGATIVE
Blood, UA: NEGATIVE
Glucose, UA: NEGATIVE
Ketones, UA: NEGATIVE
Nitrite, UA: NEGATIVE
Protein, UA: POSITIVE — AB
Spec Grav, UA: 1.02 (ref 1.010–1.025)
Urobilinogen, UA: 0.2 E.U./dL
pH, UA: 5.5 (ref 5.0–8.0)

## 2018-09-11 MED ORDER — CLONIDINE HCL 0.1 MG PO TABS
0.2000 mg | ORAL_TABLET | Freq: Once | ORAL | Status: AC
  Administered 2018-09-11: 0.2 mg via ORAL

## 2018-09-11 MED ORDER — PREDNISONE 10 MG PO TABS: ORAL_TABLET | ORAL | 0 refills | Status: DC

## 2018-09-11 NOTE — Progress Notes (Signed)
Follow Up  Subjective:    Patient ID: Stanley [name], male    DOB: 05/23/1976, 42 y.o.   MRN: [id number]  Chief Complaint  Patient presents with  . Hypertension   HPI  Stanley [name] is a 42 year old male with a past medical history of Tobacco Abuse, Stroke, Hypertensive Urgency, High Cholesterol, Dyslexia, Depression, CKD, and Anxiety. He is here today for follow up of chronic diseases.   Current Status: Since his last office visit, he has c/o continued right knee swelling. He denies visual changes, chest pain, cough, shortness of breath, heart palpitations, and falls. He continues to smoke 1 pack of cigarettes in 3 days. He has occasionally headaches and dizziness with position changes. Denies severe headaches, confusion, seizures, double vision, and blurred vision, nausea and vomiting.  He denies fevers, chills, fatigue, recent infections, weight loss, and night sweats. No reports of GI problems such as nausea, vomiting, diarrhea, and constipation. He has no reports of blood in stools, dysuria and hematuria. His anxiety level is increased because of recent relocation.   Review of Systems  Constitutional: Negative.   HENT: Negative.   Eyes: Negative.   Respiratory: Negative.   Cardiovascular: Negative.   Gastrointestinal: Negative.   Endocrine: Negative.   Genitourinary: Negative.   Musculoskeletal: Positive for joint swelling (right knee).  Skin: Negative.   Allergic/Immunologic: Negative.   Neurological: Positive for dizziness and headaches.  Hematological: Negative.   Psychiatric/Behavioral: The patient is nervous/anxious (increased situational anxiety ).    Objective:   Physical Exam  Constitutional: He is oriented to person, place, and time. He appears well-developed and well-nourished.  HENT:  Head: Normocephalic and atraumatic.  Eyes: Pupils are equal, round, and reactive to light. Conjunctivae are normal.  Neck: Normal range of motion. Neck supple.  Cardiovascular: Normal  rate, regular rhythm, normal heart sounds and intact distal pulses.  Pulmonary/Chest: Effort normal and breath sounds normal.  Abdominal: Soft. Bowel sounds are normal.  Musculoskeletal: Normal range of motion.  Neurological: He is alert and oriented to person, place, and time.  Skin: Skin is warm and dry.  Psychiatric: He has a normal mood and affect. His behavior is normal. Judgment and thought content normal.  Nursing note and vitals reviewed.  Assessment & Plan:   1. Hypertension, unspecified type Blood pressures are elevated today. Clonidine 0.3 mg given to patient in office to aid in decreasing blood pressure. Patient is advised to report to ED to further evaluate and treat increased blood pressure. patietn refused to report to ED for further evaluation. AMA signed by patient at discharge.  - Urinalysis Dipstick - cloNIDine (CATAPRES) tablet 0.2 mg  2. Acute pain of right knee - predniSONE (DELTASONE) 10 MG tablet; Day #1: Take 6 tablets by mouth (60 mg) Day #2: Take 5 tablets by mouth (50 mg) Day #3: Take 4 tablets by mouth (40 mg) Day #4: Take 3 tablets by mouth (30 mg) Day #5: Take 2 tablets by mouth (20 mg) Day #6: Take 1 tablet by mouth (10 mg).  Day #7: Complete.  Dispense: 21 tablet; Refill: 0  3. Inflammation of joint of right knee - predniSONE (DELTASONE) 10 MG tablet; Day #1: Take 6 tablets by mouth (60 mg) Day #2: Take 5 tablets by mouth (50 mg) Day #3: Take 4 tablets by mouth (40 mg) Day #4: Take 3 tablets by mouth (30 mg) Day #5: Take 2 tablets by mouth (20 mg) Day #6: Take 1 tablet by mouth (10 mg).  Day #7: Complete.  Dispense: 21 tablet; Refill: 0  4. Right leg swelling - predniSONE (DELTASONE) 10 MG tablet; Day #1: Take 6 tablets by mouth (60 mg) Day #2: Take 5 tablets by mouth (50 mg) Day #3: Take 4 tablets by mouth (40 mg) Day #4: Take 3 tablets by mouth (30 mg) Day #5: Take 2 tablets by mouth (20 mg) Day #6: Take 1 tablet by mouth (10 mg).  Day #7:  Complete.  Dispense: 21 tablet; Refill: 0  5. Situational anxiety Stable.   6. Follow up He will follow up in 3 months.   Meds ordered this encounter  Medications  . cloNIDine (CATAPRES) tablet 0.2 mg  . predniSONE (DELTASONE) 10 MG tablet    Sig: Day #1: Take 6 tablets by mouth (60 mg) Day #2: Take 5 tablets by mouth (50 mg) Day #3: Take 4 tablets by mouth (40 mg) Day #4: Take 3 tablets by mouth (30 mg) Day #5: Take 2 tablets by mouth (20 mg) Day #6: Take 1 tablet by mouth (10 mg).  Day #7: Complete.    Dispense:  21 tablet    Refill:  0   Kathe Becton,  MSN, FNP-C Patient [facility] [address] [place], [place] [address] [phone]

## 2018-09-12 ENCOUNTER — Ambulatory Visit: Payer: Self-pay | Admitting: Family Medicine

## 2018-09-12 MED FILL — predniSONE 10 MG TABS: 10 | 6 days supply | Qty: 21 | Fill #0

## 2018-09-17 MED FILL — traZODone HCL 100 MG TABS: 100 | 30 days supply | Qty: 30 | Fill #2

## 2018-09-17 MED FILL — GEMFIBROZIL 600 MG TAB: 600 | 30 days supply | Qty: 60 | Fill #3

## 2018-09-17 MED FILL — hydrALAZINE HCL 100 MG TABS: 100 | 30 days supply | Qty: 90 | Fill #3

## 2018-09-17 MED FILL — LABETALOL HCL 200 MG TABLET: 200 | 30 days supply | Qty: 180 | Fill #3

## 2018-09-18 MED FILL — METOPROLOL SUCCINATE ER 50: 50 | 30 days supply | Qty: 30 | Fill #3

## 2018-09-18 MED FILL — cloNIDine HCL 0.3 MG TABS: 0.3 | 30 days supply | Qty: 90 | Fill #3

## 2018-09-21 ENCOUNTER — Telehealth: Payer: Self-pay

## 2018-10-01 MED FILL — FUROSEMIDE 40 MG TAB: 40 | 30 days supply | Qty: 30 | Fill #2

## 2018-10-04 ENCOUNTER — Telehealth: Payer: Self-pay | Admitting: Cardiology

## 2018-10-04 ENCOUNTER — Ambulatory Visit: Payer: Self-pay | Attending: Family Medicine

## 2018-10-04 NOTE — Telephone Encounter (Signed)
Spoke with patient's mother Stanley [name]) RE: patient's recent pain.  She reports pain was constant for 2 days, in the chest of his chest between his breasts, no radiation, no n/v but some SOB.  She reports he did not sleep for 2 days and has not been eating well.  He tried taking Ibuprofen with some relief but pain mainly resolved on it's own.  He has been drinking smoothies today made with bananas and blueberries per mother's report.  Pt has an appt this afternoon to speak with a counselor about getting his medications at a reduced costs.  Mother reports pt is presently taking his medications this week but she doesn't always have the money to but them.  She reports his BP has been under good control recently.  Advised since pt is not having any active chest pain and no history of CAD to continue medications as listed.  Advised of when 911 should be called and when pt should seek further evaluation for chest pain.  Mother states understanding and that she will notify pt once he returns home.  She was grateful for the call and information.  Pt will keep appt as scheduled with Dr Marlou Porch for eval and treatment as necessary.

## 2018-10-04 NOTE — Telephone Encounter (Signed)
° °  Patient's mother calling to report patient had episode of chest pain on 10/03/2018, stating he was weak and unable to sleep. Requested to speak with patient to obtain additional details, however his mother states he is still asleep.

## 2018-10-10 ENCOUNTER — Ambulatory Visit: Payer: Self-pay | Admitting: Family Medicine

## 2018-10-17 ENCOUNTER — Ambulatory Visit (INDEPENDENT_AMBULATORY_CARE_PROVIDER_SITE_OTHER): Payer: Self-pay | Admitting: Family Medicine

## 2018-10-17 ENCOUNTER — Encounter: Payer: Self-pay | Admitting: Family Medicine

## 2018-10-17 VITALS — BP 160/92 | HR 72 | Temp 98.2°F | Ht 68.0 in | Wt 213.0 lb

## 2018-10-17 DIAGNOSIS — I10 Essential (primary) hypertension: Secondary | ICD-10-CM

## 2018-10-17 DIAGNOSIS — F418 Other specified anxiety disorders: Secondary | ICD-10-CM

## 2018-10-17 DIAGNOSIS — M25561 Pain in right knee: Secondary | ICD-10-CM

## 2018-10-17 DIAGNOSIS — Z09 Encounter for follow-up examination after completed treatment for conditions other than malignant neoplasm: Secondary | ICD-10-CM

## 2018-10-17 LAB — POCT URINALYSIS DIP (MANUAL ENTRY)
Bilirubin, UA: NEGATIVE
Blood, UA: NEGATIVE
Glucose, UA: NEGATIVE mg/dL
Ketones, POC UA: NEGATIVE mg/dL
Nitrite, UA: NEGATIVE
Protein Ur, POC: 100 mg/dL — AB
Spec Grav, UA: 1.01 (ref 1.010–1.025)
Urobilinogen, UA: 0.2 E.U./dL
pH, UA: 5 (ref 5.0–8.0)

## 2018-10-17 MED ORDER — CLONIDINE HCL 0.1 MG PO TABS
0.1000 mg | ORAL_TABLET | Freq: Once | ORAL | Status: AC
  Administered 2018-10-17: 0.1 mg via ORAL

## 2018-10-17 NOTE — Progress Notes (Signed)
Follow Up  Subjective:    Patient ID: Stanley [name], male    DOB: March 23, 1976, 43 y.o.   MRN: [id number]  Chief Complaint  Patient presents with  . Follow-up    chronic condition    HPI  Stanley [name] is a 43 year old male with a past medical history of Tobacco Use, Stroke, Hypertensive Urgency, Hyperlipidemia, Dyslexia, Depression, CKD, and Anxiety. He is here today for follow up.   Current Status: Since his last office visit, he is doing well with no complaints. He denies visual changes, chest pain, cough, shortness of breath, heart palpitations, and falls. He has occasionally headaches and dizziness with position changes. Denies severe headaches, confusion, seizures, double vision, and blurred vision, nausea and vomiting.  He denies fevers, chills, fatigue, recent infections, weight loss, and night sweats. he has not had any  falls. No reports of GI problems such as nausea, vomiting, diarrhea, and constipation. He has no reports of blood in stools, dysuria and hematuria. No depression or anxiety reported. He denies pain today.   Review of Systems  Constitutional: Negative.   HENT: Negative.   Eyes: Negative.   Respiratory: Negative.   Cardiovascular: Negative.   Gastrointestinal: Negative.   Endocrine: Negative.   Genitourinary: Negative.   Musculoskeletal: Negative.   Skin: Negative.   Allergic/Immunologic: Negative.   Neurological: Positive for dizziness and headaches.  Hematological: Negative.   Psychiatric/Behavioral: Negative.     Objective:   Physical Exam Vitals signs and nursing note reviewed.  Constitutional:      Appearance: Normal appearance.  HENT:     Head: Normocephalic and atraumatic.     Nose: Nose normal.     Mouth/Throat:     Mouth: Mucous membranes are moist.     Pharynx: Oropharynx is clear.  Eyes:     Conjunctiva/sclera: Conjunctivae normal.  Neck:     Musculoskeletal: Normal range of motion and neck supple.  Cardiovascular:     Rate and Rhythm:  Normal rate and regular rhythm.     Pulses: Normal pulses.     Heart sounds: Normal heart sounds.  Pulmonary:     Effort: Pulmonary effort is normal.     Breath sounds: Normal breath sounds.  Abdominal:     General: Abdomen is flat. Bowel sounds are normal.  Musculoskeletal: Normal range of motion.  Skin:    General: Skin is warm and dry.     Capillary Refill: Capillary refill takes less than 2 seconds.  Neurological:     General: No focal deficit present.     Mental Status: He is alert and oriented to person, place, and time.  Psychiatric:        Mood and Affect: Mood normal.        Behavior: Behavior normal.        Thought Content: Thought content normal.        Judgment: Judgment normal.    Assessment & Plan:   1. Accelerated hypertension  2. Hypertension, unspecified type Blood pressures continue to be elevated. Continue HyLabs draw at next office visit.dralazine, Isosorbide, Labetalol, and Metoprolol, and Clonidine as prescribed. Blood pressures are elevated today. Clonidine 0.3 mg given to patient in office and blood pressures remain elevated. We referred him to ED via ambulance at this time. Patient refused and signed AMA form at discharge. He denies severe headaches, confusion, seizures, double vision, and blurred vision, nausea and vomiting. He will report to ED if he experiences these symptoms. Patient verbalized understanding.   -  POCT urinalysis dipstick - cloNIDine (CATAPRES) tablet 0.3 mg  3. Acute pain of right knee Continue Motrin as prescribed.   4. Situational anxiety  5. Follow up He will follow up in 2 weeks. We will draw labs at next office visit.   Meds ordered this encounter  Medications  . cloNIDine (CATAPRES) tablet 0.1 mg   Kathe Becton,  MSN, FNP-C Patient [facility] [address] [place], [place] [address] [phone]

## 2018-10-22 ENCOUNTER — Ambulatory Visit (INDEPENDENT_AMBULATORY_CARE_PROVIDER_SITE_OTHER): Payer: Self-pay | Admitting: Cardiology

## 2018-10-22 ENCOUNTER — Encounter: Payer: Self-pay | Admitting: Cardiology

## 2018-10-22 VITALS — BP 160/92 | HR 70 | Ht 68.0 in | Wt 218.2 lb

## 2018-10-22 DIAGNOSIS — I517 Cardiomegaly: Secondary | ICD-10-CM

## 2018-10-22 DIAGNOSIS — I10 Essential (primary) hypertension: Secondary | ICD-10-CM

## 2018-10-22 DIAGNOSIS — N189 Chronic kidney disease, unspecified: Secondary | ICD-10-CM

## 2018-10-22 NOTE — Progress Notes (Signed)
Cardiology Office Note:    Date:  10/22/2018   ID:  Stanley [name], DOB 18-Nov-1975, MRN [id number]  PCP:  [name], FNP  Cardiologist:  No primary care provider on file.  Electrophysiologist:  None   Referring MD: [name], FNP     History of Present Illness:    Stanley [name] is a 43 y.o. male with difficult to control hypertension here for follow-up with recent chest pain.  Back in 2017 he was admitted with shortness of breath and hypertensive emergency with acute kidney injury.  Severe concentric hypertrophy was normal ejection fraction was noted.  Creatinine 3.9.  Patient's mother Stanley [name] was called secondary to recent chest pain.  It was constant for 2 days center chest with no radiation but some shortness of breath.  He did not sleep for 2 days and had not been eating well.  Ibuprofen seemed to help a little bit but then the pain resolved on its own.  He had been drinking banana and blueberry smoothies.                           Today he states he is not having any chest discomfort.  He does feel sometimes some shortness of breath with activity but this is not severe.  He is here with his brother.  Overall he is quite depressed about not working.  He has been waiting for potential Social Security disability but this process is going on for quite some time.  His blood pressure is also being monitored and controlled by Dr. [name] of nephrology.  He is on both labetalol and Toprol.  He is concerned about working throughout the day taking clonidine 0.3, 3 times a day.  He worries about falling asleep.  His brother is a Administrator and he had to stop taking 1 of the doses of clonidine when driving at night.  Past Medical History:  Diagnosis Date  . Anxiety   . CKD (chronic kidney disease), stage IV ([facility])   . Depression   . Dyslexia   . High cholesterol   . Hypertension   . Hypertensive urgency   . Stroke ([facility]) 06/03/2017   "minor; completely recovered today"  (06/13/2017)  . Tobacco abuse     Past Surgical History:  Procedure Laterality Date  . NO PAST SURGERIES      Current Medications: Current Meds  Medication Sig  . acetaminophen (TYLENOL) 500 MG tablet Take 1,000 mg by mouth as needed for mild pain.  Marland Kitchen albuterol (PROVENTIL HFA;VENTOLIN HFA) 108 (90 Base) MCG/ACT inhaler Inhale 2 puffs into the lungs every 4 (four) hours as needed for wheezing or shortness of breath (cough, shortness of breath or wheezing.).  Marland Kitchen amLODipine (NORVASC) 10 MG tablet Take 1 tablet (10 mg total) by mouth daily.  Marland Kitchen aspirin EC 81 MG tablet Take 81 mg by mouth daily.  Marland Kitchen atorvastatin (LIPITOR) 40 MG tablet Take 1 tablet (40 mg total) by mouth daily at 6 PM.  . cloNIDine (CATAPRES) 0.3 MG tablet Take 1 tablet (0.3 mg total) by mouth 3 (three) times daily.  . furosemide (LASIX) 20 MG tablet Take 1 tablet (20 mg total) by mouth daily as needed.  Marland Kitchen gemfibrozil (LOPID) 600 MG tablet Take 1 tablet (600 mg total) by mouth 2 (two) times daily before a meal.  . hydrALAZINE (APRESOLINE) 100 MG tablet Take 1 tablet (100 mg total) by mouth 3 (three) times daily.  Marland Kitchen  ibuprofen (ADVIL,MOTRIN) 800 MG tablet Take 1 tablet (800 mg total) by mouth every 8 (eight) hours as needed.  . isosorbide mononitrate (IMDUR) 30 MG 24 hr tablet Take 1 tablet (30 mg total) by mouth daily.  Marland Kitchen labetalol (NORMODYNE) 200 MG tablet Take 2 tablets (400 mg total) by mouth 3 (three) times daily.  . metoprolol succinate (TOPROL-XL) 50 MG 24 hr tablet Take 1 tablet (50 mg total) by mouth daily. Take with or immediately following a meal.  . Multiple Vitamin (MULTIVITAMIN WITH MINERALS) TABS tablet Take 1 tablet by mouth daily as needed.  . nicotine (NICODERM CQ - DOSED IN MG/24 HOURS) 14 mg/24hr patch Place 1 patch (14 mg total) onto the skin daily.  . potassium chloride SA (K-DUR,KLOR-CON) 20 MEQ tablet TAKE 1 TABLET BY MOUTH DAILY.  . traZODone (DESYREL) 100 MG tablet Take 1 tablet (100 mg total) by mouth at  bedtime as needed for sleep.     Allergies:   Lactose intolerance (gi); Other; and Nitroglycerin   Social History   Socioeconomic History  . Marital status: Married    Spouse name: Not on file  . Number of children: Not on file  . Years of education: Not on file  . Highest education level: Not on file  Occupational History  . Not on file  Social Needs  . Financial resource strain: Not on file  . Food insecurity:    Worry: Not on file    Inability: Not on file  . Transportation needs:    Medical: Not on file    Non-medical: Not on file  Tobacco Use  . Smoking status: Current Every Day Smoker    Packs/day: 1.00    Years: 30.00    Pack years: 30.00    Types: Cigarettes  . Smokeless tobacco: Never Used  Substance and Sexual Activity  . Alcohol use: No  . Drug use: Yes    Types: Marijuana    Comment: 06/13/2017 "not often"  . Sexual activity: Not Currently  Lifestyle  . Physical activity:    Days per week: Not on file    Minutes per session: Not on file  . Stress: Not on file  Relationships  . Social connections:    Talks on phone: Not on file    Gets together: Not on file    Attends religious service: Not on file    Active member of club or organization: Not on file    Attends meetings of clubs or organizations: Not on file    Relationship status: Not on file  Other Topics Concern  . Not on file  Social History Narrative  . Not on file     Family History: The patient's family history includes Diabetes type II in his brother; Hypertension in his brother and mother.  ROS:   Please see the history of present illness.   Denies any fevers chills nausea vomiting syncope  All other systems reviewed and are negative.  EKGs/Labs/Other Studies Reviewed:    The following studies were reviewed today:   EKG:  EKG is not ordered today.   Recent Labs: 02/11/2018: Magnesium 2.0 02/12/2018: B Natriuretic Peptide 207.6 03/01/2018: ALT 10; TSH 1.310 05/18/2018: BUN 32;  Creatinine, Ser 3.24; Hemoglobin 13.0; Platelets 263; Potassium 4.0; Sodium 137  Recent Lipid Panel    Component Value Date/Time   CHOL 96 (L) 03/01/2018 1356   TRIG 62 03/01/2018 1356   HDL 40 03/01/2018 1356   CHOLHDL 2.4 03/01/2018 1356   CHOLHDL 4.3  09/27/2016 0301   VLDL 14 09/27/2016 0301   LDLCALC 44 03/01/2018 1356    Physical Exam:    VS:  BP (!) 160/92   Pulse 70   Ht 5\' 8"  (1.727 m)   Wt 218 lb 3.2 oz (99 kg)   SpO2 97%   BMI 33.18 kg/m     Wt Readings from Last 3 Encounters:  10/22/18 218 lb 3.2 oz (99 kg)  10/17/18 213 lb (96.6 kg)  09/11/18 210 lb (95.3 kg)     GEN:  Well nourished, well developed in no acute distress HEENT: Normal NECK: No JVD; No carotid bruits LYMPHATICS: No lymphadenopathy CARDIAC: RRR, no murmurs, rubs, gallops RESPIRATORY:  Clear to auscultation without rales, wheezing or rhonchi  ABDOMEN: Soft, non-tender, non-distended MUSCULOSKELETAL:  No edema; No deformity  SKIN: Warm and dry NEUROLOGIC:  Alert and oriented x 3 PSYCHIATRIC:  Normal affect , somber  ASSESSMENT:    1. Hypertension, accelerated   2. Chronic kidney disease, unspecified CKD stage   3. LVH (left ventricular hypertrophy)    PLAN:    In order of problems listed above:  Difficult to control hypertension - Under better control with current regimen.  Appreciate Dr. Elza Rafter work.   Shortness of breath -Echocardiogram with normal ejection fraction.  LVH quite severe.  Likely playing a role in his shortness of breath.  Continue with blood pressure control, walking, exercise.  Since his blood pressure is under better control, I would not have a problem with him going back to work.  I would like for him to consult with Dr. Macario Golds as well.  He is worried about feeling tired with clonidine.   Medication Adjustments/Labs and Tests Ordered: Current medicines are reviewed at length with the patient today.  Concerns regarding medicines are outlined above.  No  orders of the defined types were placed in this encounter.  No orders of the defined types were placed in this encounter.   Patient Instructions  Medication Instructions:  The current medical regimen is effective;  continue present plan and medications.  If you need a refill on your cardiac medications before your next appointment, please call your pharmacy.   Follow-Up: At [facility], you and your health needs are our priority.  As part of our continuing mission to provide you with exceptional heart care, we have created designated Provider Care Teams.  These Care Teams include your primary Cardiologist (physician) and Advanced Practice Providers (APPs -  Physician Assistants and Nurse Practitioners) who all work together to provide you with the care you need, when you need it. You will need a follow up appointment in 12 months.  Please call our office 2 months in advance to schedule this appointment.  You may see Dr Candee Furbish or one of the following Advanced Practice Providers on your designated Care Team:   Truitt Merle, NP Cecilie Kicks, NP . Kathyrn Drown, NP  Thank you for choosing [facility]!!        Signed, Candee Furbish, MD  10/22/2018 3:58 PM    [place]

## 2018-10-22 NOTE — Patient Instructions (Signed)
Medication Instructions:  The current medical regimen is effective;  continue present plan and medications.  If you need a refill on your cardiac medications before your next appointment, please call your pharmacy.   Follow-Up: At [facility], you and your health needs are our priority.  As part of our continuing mission to provide you with exceptional heart care, we have created designated Provider Care Teams.  These Care Teams include your primary Cardiologist (physician) and Advanced Practice Providers (APPs -  Physician Assistants and Nurse Practitioners) who all work together to provide you with the care you need, when you need it. You will need a follow up appointment in 12 months.  Please call our office 2 months in advance to schedule this appointment.  You may see Dr Mark Skains. or one of the following Advanced Practice Providers on your designated Care Team:   Lori Gerhardt, NP Laura Ingold, NP . Jill McDaniel, NP  Thank you for choosing [facility]!!      

## 2018-10-25 MED FILL — traZODone HCL 100 MG TABS: 100 | 30 days supply | Qty: 30 | Fill #3

## 2018-10-25 MED FILL — ?FUROSEMIDE 40 MG TABLET: 40 | 30 days supply | Qty: 30 | Fill #3

## 2018-10-25 MED FILL — GEMFIBROZIL 600 MG TAB: 600 | 30 days supply | Qty: 60 | Fill #4

## 2018-10-25 MED FILL — hydrALAZINE HCL 100 MG TABS: 100 | 30 days supply | Qty: 90 | Fill #4

## 2018-10-25 MED FILL — METOPROLOL SUCCINATE ER 50: 50 | 30 days supply | Qty: 30 | Fill #4

## 2018-10-25 MED FILL — LABETALOL HCL 200 MG TABLET: 200 | 30 days supply | Qty: 180 | Fill #4

## 2018-10-31 ENCOUNTER — Ambulatory Visit: Payer: Self-pay | Admitting: Family Medicine

## 2018-11-01 MED FILL — cloNIDine HCL 0.3 MG TABS: 0.3 | 30 days supply | Qty: 90 | Fill #4

## 2018-11-14 ENCOUNTER — Encounter: Payer: Self-pay | Admitting: Family Medicine

## 2018-11-14 ENCOUNTER — Ambulatory Visit (INDEPENDENT_AMBULATORY_CARE_PROVIDER_SITE_OTHER): Payer: Self-pay | Admitting: Family Medicine

## 2018-11-14 VITALS — BP 210/102 | HR 70 | Temp 97.8°F | Ht 68.0 in | Wt 211.0 lb

## 2018-11-14 DIAGNOSIS — Z09 Encounter for follow-up examination after completed treatment for conditions other than malignant neoplasm: Secondary | ICD-10-CM

## 2018-11-14 DIAGNOSIS — N5089 Other specified disorders of the male genital organs: Secondary | ICD-10-CM

## 2018-11-14 DIAGNOSIS — I10 Essential (primary) hypertension: Secondary | ICD-10-CM

## 2018-11-14 DIAGNOSIS — Z87438 Personal history of other diseases of male genital organs: Secondary | ICD-10-CM

## 2018-11-14 LAB — POCT URINALYSIS DIP (MANUAL ENTRY)
Bilirubin, UA: NEGATIVE
Blood, UA: NEGATIVE
Glucose, UA: NEGATIVE mg/dL
Ketones, POC UA: NEGATIVE mg/dL
Leukocytes, UA: NEGATIVE
Nitrite, UA: NEGATIVE
Protein Ur, POC: >=300 mg/dL — AB
Spec Grav, UA: 1.025 (ref 1.010–1.025)
Urobilinogen, UA: 0.2 E.U./dL
pH, UA: 5.5 (ref 5.0–8.0)

## 2018-11-14 MED ORDER — CLONIDINE HCL 0.1 MG PO TABS
0.1000 mg | ORAL_TABLET | Freq: Once | ORAL | Status: AC
  Administered 2018-11-14: 0.1 mg via ORAL

## 2018-11-14 NOTE — Progress Notes (Signed)
Patient [name]  Established Patient Office Visit  Subjective:  Patient ID: Stanley [name], male    DOB: 1976-03-04  Age: 43 y.o. MRN: [id number]  CC:  Chief Complaint  Patient presents with  . Follow-up    chronic condition     HPI Stanley [name] is a 43 year old male who presents for follow up.  Past Medical History:  Diagnosis Date  . Anxiety   . CKD (chronic kidney disease), stage IV ([employer])   . Depression   . Dyslexia   . High cholesterol   . Hypertension   . Hypertensive urgency   . Stroke ([place]) 06/03/2017   "minor; completely recovered today" (06/13/2017)  . Tobacco abuse    Current Status: Since his last office visit, he denies visual changes, chest pain, cough, shortness of breath, heart palpitations, and falls. He has occasional headaches and dizziness with position changes. Denies severe headaches, confusion, seizures, double vision, and blurred vision, nausea and vomiting. His anxiety is mild today, r/t relationship situation. He denies suicidal ideations, homicidal ideations, or auditory hallucinations.   He denies fevers, chills, fatigue, recent infections, weight loss, and night sweats. He has not had any dizziness, and falls. No chest pain, heart palpitations, cough and shortness of breath reported. No reports of GI problems such as diarrhea, and constipation. He has no reports of blood in stools, dysuria and hematuria. He reports today that his testicles are both swollen today. He denies pain.   Past Surgical History:  Procedure Laterality Date  . NO PAST SURGERIES      Family History  Problem Relation Age of Onset  . Hypertension Mother   . Hypertension Brother   . Diabetes type II Brother     Social History   Socioeconomic History  . Marital status: Married    Spouse name: Not on file  . Number of children: Not on file  . Years of education: Not on file  . Highest education level: Not on file  Occupational  History  . Not on file  Social Needs  . Financial resource strain: Not on file  . Food insecurity:    Worry: Not on file    Inability: Not on file  . Transportation needs:    Medical: Not on file    Non-medical: Not on file  Tobacco Use  . Smoking status: Current Every Day Smoker    Packs/day: 1.00    Years: 30.00    Pack years: 30.00    Types: Cigarettes  . Smokeless tobacco: Never Used  Substance and Sexual Activity  . Alcohol use: No  . Drug use: Yes    Types: Marijuana    Comment: 06/13/2017 "not often"  . Sexual activity: Not Currently  Lifestyle  . Physical activity:    Days per week: Not on file    Minutes per session: Not on file  . Stress: Not on file  Relationships  . Social connections:    Talks on phone: Not on file    Gets together: Not on file    Attends religious service: Not on file    Active member of club or organization: Not on file    Attends meetings of clubs or organizations: Not on file    Relationship status: Not on file  . Intimate partner violence:    Fear of current or ex partner: Not on file    Emotionally abused: Not on file    Physically abused:  Not on file    Forced sexual activity: Not on file  Other Topics Concern  . Not on file  Social History Narrative  . Not on file    Outpatient Medications Prior to Visit  Medication Sig Dispense Refill  . acetaminophen (TYLENOL) 500 MG tablet Take 1,000 mg by mouth as needed for mild pain.    Marland Kitchen albuterol (PROVENTIL HFA;VENTOLIN HFA) 108 (90 Base) MCG/ACT inhaler Inhale 2 puffs into the lungs every 4 (four) hours as needed for wheezing or shortness of breath (cough, shortness of breath or wheezing.). 1 Inhaler 1  . amLODipine (NORVASC) 10 MG tablet Take 1 tablet (10 mg total) by mouth daily. 90 tablet 3  . aspirin EC 81 MG tablet Take 81 mg by mouth daily.    Marland Kitchen atorvastatin (LIPITOR) 40 MG tablet Take 1 tablet (40 mg total) by mouth daily at 6 PM. 90 tablet 3  . cloNIDine (CATAPRES) 0.3 MG  tablet Take 1 tablet (0.3 mg total) by mouth 3 (three) times daily. 270 tablet 3  . furosemide (LASIX) 20 MG tablet Take 1 tablet (20 mg total) by mouth daily as needed. 90 tablet 3  . gemfibrozil (LOPID) 600 MG tablet Take 1 tablet (600 mg total) by mouth 2 (two) times daily before a meal. 180 tablet 3  . hydrALAZINE (APRESOLINE) 100 MG tablet Take 1 tablet (100 mg total) by mouth 3 (three) times daily. 270 tablet 3  . ibuprofen (ADVIL,MOTRIN) 800 MG tablet Take 1 tablet (800 mg total) by mouth every 8 (eight) hours as needed. 30 tablet 2  . isosorbide mononitrate (IMDUR) 30 MG 24 hr tablet Take 1 tablet (30 mg total) by mouth daily. 90 tablet 3  . labetalol (NORMODYNE) 200 MG tablet Take 2 tablets (400 mg total) by mouth 3 (three) times daily. 270 tablet 3  . metoprolol succinate (TOPROL-XL) 50 MG 24 hr tablet Take 1 tablet (50 mg total) by mouth daily. Take with or immediately following a meal. 90 tablet 3  . Multiple Vitamin (MULTIVITAMIN WITH MINERALS) TABS tablet Take 1 tablet by mouth daily as needed.    . nicotine (NICODERM CQ - DOSED IN MG/24 HOURS) 14 mg/24hr patch Place 1 patch (14 mg total) onto the skin daily. 28 patch 0  . potassium chloride SA (K-DUR,KLOR-CON) 20 MEQ tablet TAKE 1 TABLET BY MOUTH DAILY. 30 tablet 3  . traZODone (DESYREL) 100 MG tablet Take 1 tablet (100 mg total) by mouth at bedtime as needed for sleep. 30 tablet 3   No facility-administered medications prior to visit.     Allergies  Allergen Reactions  . Lactose Intolerance (Gi) Nausea And Vomiting  . Other Swelling    All beans  . Nitroglycerin Anxiety and Other (See Comments)    Pain and feels like he is on fire    ROS Review of Systems  Constitutional: Negative.   HENT: Negative.   Eyes: Negative.   Respiratory: Negative.   Cardiovascular: Negative.   Gastrointestinal: Negative.   Endocrine: Negative.   Genitourinary: Negative.   Musculoskeletal: Negative.   Skin: Negative.     Allergic/Immunologic: Negative.   Neurological: Positive for dizziness and headaches.  Hematological: Negative.   Psychiatric/Behavioral: Negative.       Objective:    Physical Exam  Constitutional: He is oriented to person, place, and time. He appears well-developed and well-nourished.  HENT:  Head: Normocephalic and atraumatic.  Eyes: Conjunctivae are normal.  Neck: Normal range of motion. Neck supple.  Cardiovascular: Normal  rate, regular rhythm, normal heart sounds and intact distal pulses.  Pulmonary/Chest: Effort normal and breath sounds normal.  Abdominal: Soft. Bowel sounds are normal.  Musculoskeletal: Normal range of motion.  Neurological: He is alert and oriented to person, place, and time. He has normal reflexes.  Skin: Skin is warm and dry.  Psychiatric: He has a normal mood and affect. His behavior is normal. Judgment and thought content normal.  Nursing note and vitals reviewed.   BP (!) 210/102   Pulse 70   Temp 97.8 F (36.6 C) (Oral)   Ht 5\' 8"  (1.727 m)   Wt 211 lb (95.7 kg)   SpO2 100%   BMI 32.08 kg/m  Wt Readings from Last 3 Encounters:  11/14/18 211 lb (95.7 kg)  10/22/18 218 lb 3.2 oz (99 kg)  10/17/18 213 lb (96.6 kg)   Health Maintenance Due  Topic Date Due  . TETANUS/TDAP  07/16/1996    There are no preventive care reminders to display for this patient.  Lab Results  Component Value Date   TSH 1.310 03/01/2018   Lab Results  Component Value Date   WBC 5.1 05/18/2018   HGB 13.0 05/18/2018   HCT 41.0 05/18/2018   MCV 88.9 05/18/2018   PLT 263 05/18/2018   Lab Results  Component Value Date   NA 137 05/18/2018   K 4.0 05/18/2018   CO2 23 05/18/2018   GLUCOSE 90 05/18/2018   BUN 32 (H) 05/18/2018   CREATININE 3.24 (H) 05/18/2018   BILITOT 0.3 03/01/2018   ALKPHOS 92 03/01/2018   AST 13 03/01/2018   ALT 10 03/01/2018   PROT 7.3 03/01/2018   ALBUMIN 4.5 03/01/2018   CALCIUM 9.1 05/18/2018   ANIONGAP 9 05/18/2018   Lab  Results  Component Value Date   CHOL 96 (L) 03/01/2018   Lab Results  Component Value Date   HDL 40 03/01/2018   Lab Results  Component Value Date   LDLCALC 44 03/01/2018   Lab Results  Component Value Date   TRIG 62 03/01/2018   Lab Results  Component Value Date   CHOLHDL 2.4 03/01/2018   Lab Results  Component Value Date   HGBA1C 4.9 03/01/2018   Assessment & Plan:   1. Accelerated hypertension Blood pressures are elevated today. Clonidine 0.3 mg given to patient in office and blood pressures remain elevated. We referred him to ED via ambulance at this time. Patient refused and signed AMA form at discharge. He denies severe headaches, confusion, seizures, double vision, and blurred vision, nausea and vomiting. He will report to ED if he experiences these symptoms. Patient verbalized understanding.    2. Hypertension, unspecified type - POCT urinalysis dipstick - cloNIDine (CATAPRES) tablet 0.1 mg  3. Swollen testicle Testicular US ordered today.  - US Scrotum; Future - US SCROTUM W/DOPPLER; Future  4. History of hydrocele   5. Follow up Hd sill follow up in 2 months.  Meds ordered this encounter  Medications  . cloNIDine (CATAPRES) tablet 0.1 mg   Orders Placed This Encounter  Procedures  . US Scrotum  . US SCROTUM W/DOPPLER  . POCT urinalysis dipstick   Referral Orders  No referral(s) requested today   Kathe Becton,  MSN, FNP-C Patient [place], [place] [address] [phone]   Problem List Items Addressed This Visit      Cardiovascular and Mediastinum   Hypertension - Primary   Relevant Medications   cloNIDine (CATAPRES)  tablet 0.1 mg (Completed)   Other Relevant Orders   POCT urinalysis dipstick (Completed)    Other Visit Diagnoses    Accelerated hypertension       Relevant Medications   cloNIDine (CATAPRES) tablet 0.1 mg (Completed)   Swollen testicle       Relevant Orders   US  Scrotum   US SCROTUM W/DOPPLER   Follow up          Meds ordered this encounter  Medications  . cloNIDine (CATAPRES) tablet 0.1 mg    Follow-up: No follow-ups on file.    [name], FNP

## 2018-11-15 ENCOUNTER — Telehealth: Payer: Self-pay

## 2018-11-15 NOTE — Telephone Encounter (Signed)
Patient mom notified of appointment for [place] on 11/21/2018

## 2018-11-21 ENCOUNTER — Encounter: Payer: Self-pay | Admitting: Family Medicine

## 2018-11-21 ENCOUNTER — Ambulatory Visit (HOSPITAL_COMMUNITY)
Admission: RE | Admit: 2018-11-21 | Discharge: 2018-11-21 | Disposition: A | Discharge status: Home / Self Care | Payer: Medicaid Other | Source: Ambulatory Visit | Attending: Family Medicine | Admitting: Family Medicine

## 2018-11-21 ENCOUNTER — Ambulatory Visit (INDEPENDENT_AMBULATORY_CARE_PROVIDER_SITE_OTHER): Payer: Self-pay | Admitting: Family Medicine

## 2018-11-21 VITALS — BP 212/118 | HR 66 | Ht 68.0 in | Wt 211.0 lb

## 2018-11-21 DIAGNOSIS — I10 Essential (primary) hypertension: Secondary | ICD-10-CM

## 2018-11-21 DIAGNOSIS — N5089 Other specified disorders of the male genital organs: Secondary | ICD-10-CM | POA: Diagnosis not present

## 2018-11-21 DIAGNOSIS — Z09 Encounter for follow-up examination after completed treatment for conditions other than malignant neoplasm: Secondary | ICD-10-CM

## 2018-11-21 MED ORDER — CLONIDINE HCL 0.1 MG PO TABS
0.2000 mg | ORAL_TABLET | Freq: Once | ORAL | Status: AC
  Administered 2018-11-21: 0.2 mg via ORAL

## 2018-11-21 MED ORDER — CLONIDINE HCL 0.1 MG PO TABS
0.1000 mg | ORAL_TABLET | Freq: Once | ORAL | Status: AC
  Administered 2018-11-21: 0.1 mg via ORAL

## 2018-11-21 NOTE — Progress Notes (Signed)
Patient [facility]  Established Patient Office Visit  Subjective:  Patient ID: Stanley [name], male    DOB: [birth year]  Age: 43 y.o. MRN: [id number]  CC:  Chief Complaint  Patient presents with  . Blood Pressure Check    HPI Stanley [name] is a 43 year old male who presents for follow up on Hypertension.   Past Medical History:  Diagnosis Date  . Anxiety   . CKD (chronic kidney disease), stage IV ([place])   . Depression   . Dyslexia   . High cholesterol   . Hypertension   . Hypertensive urgency   . Stroke ([place]) 06/03/2017   "minor; completely recovered today" (06/13/2017)  . Tobacco abuse    Current Status: Since his last office visit, he states that he has no complaints today. He states that he has been taking all medications for Hypertension as directed. He denies visual changes, chest pain, cough, shortness of breath, heart palpitations, and falls. He has occasional headaches and dizziness with position changes. Denies severe headaches, confusion, seizures, double vision, and blurred vision, nausea and vomiting. His anxiety is mild today. He denies suicidal ideations, homicidal ideations, or auditory hallucinations.  He denies fevers, chills, fatigue, recent infections, weight loss, and night sweats. No reports of GI problems such as nausea, vomiting, diarrhea, and constipation. He has no reports of blood in stools, dysuria and hematuria. He denies pain today.   Past Surgical History:  Procedure Laterality Date  . NO PAST SURGERIES      Family History  Problem Relation Age of Onset  . Hypertension Mother   . Hypertension Brother   . Diabetes type II Brother     Social History   Socioeconomic History  . Marital status: Married    Spouse name: Not on file  . Number of children: Not on file  . Years of education: Not on file  . Highest education level: Not on file  Occupational History  . Not on file  Social Needs  . Financial  resource strain: Not on file  . Food insecurity:    Worry: Not on file    Inability: Not on file  . Transportation needs:    Medical: Not on file    Non-medical: Not on file  Tobacco Use  . Smoking status: Current Every Day Smoker    Packs/day: 1.00    Years: 30.00    Pack years: 30.00    Types: Cigarettes  . Smokeless tobacco: Never Used  Substance and Sexual Activity  . Alcohol use: No  . Drug use: Yes    Types: Marijuana    Comment: 06/13/2017 "not often"  . Sexual activity: Not Currently  Lifestyle  . Physical activity:    Days per week: Not on file    Minutes per session: Not on file  . Stress: Not on file  Relationships  . Social connections:    Talks on phone: Not on file    Gets together: Not on file    Attends religious service: Not on file    Active member of club or organization: Not on file    Attends meetings of clubs or organizations: Not on file    Relationship status: Not on file  . Intimate partner violence:    Fear of current or ex partner: Not on file    Emotionally abused: Not on file    Physically abused: Not on file    Forced sexual activity:  Not on file  Other Topics Concern  . Not on file  Social History Narrative  . Not on file    Outpatient Medications Prior to Visit  Medication Sig Dispense Refill  . acetaminophen (TYLENOL) 500 MG tablet Take 1,000 mg by mouth as needed for mild pain.    Marland Kitchen albuterol (PROVENTIL HFA;VENTOLIN HFA) 108 (90 Base) MCG/ACT inhaler Inhale 2 puffs into the lungs every 4 (four) hours as needed for wheezing or shortness of breath (cough, shortness of breath or wheezing.). 1 Inhaler 1  . amLODipine (NORVASC) 10 MG tablet Take 1 tablet (10 mg total) by mouth daily. 90 tablet 3  . aspirin EC 81 MG tablet Take 81 mg by mouth daily.    Marland Kitchen atorvastatin (LIPITOR) 40 MG tablet Take 1 tablet (40 mg total) by mouth daily at 6 PM. 90 tablet 3  . cloNIDine (CATAPRES) 0.3 MG tablet Take 1 tablet (0.3 mg total) by mouth 3 (three)  times daily. 270 tablet 3  . furosemide (LASIX) 20 MG tablet Take 1 tablet (20 mg total) by mouth daily as needed. 90 tablet 3  . gemfibrozil (LOPID) 600 MG tablet Take 1 tablet (600 mg total) by mouth 2 (two) times daily before a meal. 180 tablet 3  . hydrALAZINE (APRESOLINE) 100 MG tablet Take 1 tablet (100 mg total) by mouth 3 (three) times daily. 270 tablet 3  . ibuprofen (ADVIL,MOTRIN) 800 MG tablet Take 1 tablet (800 mg total) by mouth every 8 (eight) hours as needed. 30 tablet 2  . isosorbide mononitrate (IMDUR) 30 MG 24 hr tablet Take 1 tablet (30 mg total) by mouth daily. 90 tablet 3  . labetalol (NORMODYNE) 200 MG tablet Take 2 tablets (400 mg total) by mouth 3 (three) times daily. 270 tablet 3  . metoprolol succinate (TOPROL-XL) 50 MG 24 hr tablet Take 1 tablet (50 mg total) by mouth daily. Take with or immediately following a meal. 90 tablet 3  . Multiple Vitamin (MULTIVITAMIN WITH MINERALS) TABS tablet Take 1 tablet by mouth daily as needed.    . nicotine (NICODERM CQ - DOSED IN MG/24 HOURS) 14 mg/24hr patch Place 1 patch (14 mg total) onto the skin daily. 28 patch 0  . potassium chloride SA (K-DUR,KLOR-CON) 20 MEQ tablet TAKE 1 TABLET BY MOUTH DAILY. 30 tablet 3  . traZODone (DESYREL) 100 MG tablet Take 1 tablet (100 mg total) by mouth at bedtime as needed for sleep. 30 tablet 3   No facility-administered medications prior to visit.     Allergies  Allergen Reactions  . Lactose Intolerance (Gi) Nausea And Vomiting  . Other Swelling    All beans  . Nitroglycerin Anxiety and Other (See Comments)    Pain and feels like he is on fire    ROS Review of Systems  Constitutional: Negative.   HENT: Negative.   Eyes: Negative.   Respiratory: Negative.   Cardiovascular: Negative.   Gastrointestinal: Negative.   Endocrine: Negative.   Genitourinary: Negative.   Musculoskeletal: Negative.   Skin: Negative.   Allergic/Immunologic: Negative.   Neurological: Positive for dizziness  and headaches.  Hematological: Negative.       Objective:    Physical Exam  Constitutional: He is oriented to person, place, and time. He appears well-developed and well-nourished.  HENT:  Head: Normocephalic and atraumatic.  Eyes: Conjunctivae are normal.  Neck: Normal range of motion. Neck supple.  Cardiovascular: Normal rate, regular rhythm, normal heart sounds and intact distal pulses.  Pulmonary/Chest: Effort normal  and breath sounds normal.  Abdominal: Soft. Bowel sounds are normal.  Musculoskeletal: Normal range of motion.  Neurological: He is alert and oriented to person, place, and time. He has normal reflexes.  Skin: Skin is warm and dry.  Psychiatric: He has a normal mood and affect. His behavior is normal. Judgment and thought content normal.  Nursing note and vitals reviewed.   BP (!) 212/118   Pulse 66   Ht 5\' 8"  (1.727 m)   Wt 211 lb (95.7 kg)   SpO2 100%   BMI 32.08 kg/m  Wt Readings from Last 3 Encounters:  11/21/18 211 lb (95.7 kg)  11/14/18 211 lb (95.7 kg)  10/22/18 218 lb 3.2 oz (99 kg)     Health Maintenance Due  Topic Date Due  . TETANUS/TDAP  07/16/1996    There are no preventive care reminders to display for this patient.  Lab Results  Component Value Date   TSH 1.310 03/01/2018   Lab Results  Component Value Date   WBC 5.1 05/18/2018   HGB 13.0 05/18/2018   HCT 41.0 05/18/2018   MCV 88.9 05/18/2018   PLT 263 05/18/2018   Lab Results  Component Value Date   NA 137 05/18/2018   K 4.0 05/18/2018   CO2 23 05/18/2018   GLUCOSE 90 05/18/2018   BUN 32 (H) 05/18/2018   CREATININE 3.24 (H) 05/18/2018   BILITOT 0.3 03/01/2018   ALKPHOS 92 03/01/2018   AST 13 03/01/2018   ALT 10 03/01/2018   PROT 7.3 03/01/2018   ALBUMIN 4.5 03/01/2018   CALCIUM 9.1 05/18/2018   ANIONGAP 9 05/18/2018   Lab Results  Component Value Date   CHOL 96 (L) 03/01/2018   Lab Results  Component Value Date   HDL 40 03/01/2018   Lab Results    Component Value Date   LDLCALC 44 03/01/2018   Lab Results  Component Value Date   TRIG 62 03/01/2018   Lab Results  Component Value Date   CHOLHDL 2.4 03/01/2018   Lab Results  Component Value Date   HGBA1C 4.9 03/01/2018   Assessment & Plan:   1. Hypertension, accelerated Blood pressures are elevated today. Clonidine 0.3 mg given to patient in office and blood pressures remain elevated. We referred him to ED via ambulance at this time. Patient refused and signed AMA form at discharge. He denies severe headaches, confusion, seizures, double vision, and blurred vision, nausea and vomiting. He will report to ED if he experiences these symptoms. Patient verbalized understanding.   - cloNIDine (CATAPRES) tablet 0.2 mg - cloNIDine (CATAPRES) tablet 0.1 mg  2. Follow up He will follow up in 1 week for blood pressure re-check.   Meds ordered this encounter  Medications  . cloNIDine (CATAPRES) tablet 0.2 mg  . cloNIDine (CATAPRES) tablet 0.1 mg   No orders of the defined types were placed in this encounter.   Referral Orders  No referral(s) requested today   Kathe Becton,  MSN, FNP-C Patient [address] [place], [place] [address] [phone]  Problem List Items Addressed This Visit    None    Visit Diagnoses    Hypertension, accelerated    -  Primary   Relevant Medications   cloNIDine (CATAPRES) tablet 0.2 mg (Completed)   cloNIDine (CATAPRES) tablet 0.1 mg (Completed)   Follow up          Meds ordered this encounter  Medications  . cloNIDine (CATAPRES) tablet 0.2  mg  . cloNIDine (CATAPRES) tablet 0.1 mg    Follow-up: No follow-ups on file.    [name], FNP

## 2018-11-22 MED FILL — cloNIDine HCL 0.3 MG TABS: 0.3 | 30 days supply | Qty: 90 | Fill #4

## 2018-11-26 ENCOUNTER — Other Ambulatory Visit: Payer: Self-pay | Admitting: Family Medicine

## 2018-11-26 DIAGNOSIS — G4700 Insomnia, unspecified: Secondary | ICD-10-CM

## 2018-11-26 MED FILL — ?FUROSEMIDE 40 MG TABLET: 40 | 30 days supply | Qty: 30 | Fill #4

## 2018-11-26 MED FILL — hydrALAZINE HCL 100 MG TABS: 100 | 30 days supply | Qty: 90 | Fill #5

## 2018-11-28 ENCOUNTER — Other Ambulatory Visit: Payer: Self-pay

## 2018-11-28 DIAGNOSIS — G4700 Insomnia, unspecified: Secondary | ICD-10-CM

## 2018-11-28 MED ORDER — TRAZODONE HCL 100 MG PO TABS: 100.0000 mg | ORAL_TABLET | Freq: Every evening | ORAL | 3 refills | Status: DC | PRN

## 2018-11-28 MED FILL — traZODone HCL 100 MG TABS: 100 | 30 days supply | Qty: 30 | Fill #0

## 2018-11-28 NOTE — Telephone Encounter (Signed)
Refill sent to pharmacy.   

## 2018-11-30 MED FILL — ATORVASTATIN CALCIUM 40 MG: 40 | 30 days supply | Qty: 30 | Fill #3

## 2018-11-30 MED FILL — LABETALOL HCL 200 MG TABLET: 200 | 30 days supply | Qty: 180 | Fill #5

## 2018-11-30 MED FILL — GEMFIBROZIL 600 MG TAB: 600 | 30 days supply | Qty: 60 | Fill #5

## 2018-11-30 MED FILL — cloNIDine HCL 0.3 MG TABS: 0.3 | 30 days supply | Qty: 90 | Fill #5

## 2018-11-30 MED FILL — AMLODIPINE BESYLATE 10 MG T: 10 | 30 days supply | Qty: 30 | Fill #3

## 2018-11-30 MED FILL — METOPROLOL SUCCINATE ER 50: 50 | 30 days supply | Qty: 30 | Fill #5

## 2018-12-12 ENCOUNTER — Ambulatory Visit: Payer: Self-pay | Admitting: Family Medicine

## 2018-12-19 ENCOUNTER — Ambulatory Visit (INDEPENDENT_AMBULATORY_CARE_PROVIDER_SITE_OTHER): Payer: Self-pay | Admitting: Family Medicine

## 2018-12-19 ENCOUNTER — Other Ambulatory Visit: Payer: Self-pay

## 2018-12-19 ENCOUNTER — Encounter: Payer: Self-pay | Admitting: Family Medicine

## 2018-12-19 VITALS — BP 166/97 | HR 68 | Temp 98.1°F | Resp 18 | Ht 71.0 in | Wt 211.0 lb

## 2018-12-19 DIAGNOSIS — M25561 Pain in right knee: Secondary | ICD-10-CM | POA: Insufficient documentation

## 2018-12-19 DIAGNOSIS — M13161 Monoarthritis, not elsewhere classified, right knee: Secondary | ICD-10-CM | POA: Insufficient documentation

## 2018-12-19 DIAGNOSIS — I10 Essential (primary) hypertension: Secondary | ICD-10-CM

## 2018-12-19 DIAGNOSIS — Z09 Encounter for follow-up examination after completed treatment for conditions other than malignant neoplasm: Secondary | ICD-10-CM

## 2018-12-19 DIAGNOSIS — N433 Hydrocele, unspecified: Secondary | ICD-10-CM

## 2018-12-19 DIAGNOSIS — F419 Anxiety disorder, unspecified: Secondary | ICD-10-CM

## 2018-12-19 DIAGNOSIS — N5089 Other specified disorders of the male genital organs: Secondary | ICD-10-CM

## 2018-12-19 MED ORDER — CLONIDINE HCL 0.1 MG PO TABS
0.1000 mg | ORAL_TABLET | Freq: Once | ORAL | Status: AC
  Administered 2018-12-19: 0.1 mg via ORAL

## 2018-12-19 MED ORDER — BUSPIRONE HCL 5 MG PO TABS: 5.0000 mg | ORAL_TABLET | Freq: Two times a day (BID) | ORAL | 1 refills | Status: DC

## 2018-12-19 MED ORDER — IBUPROFEN 800 MG PO TABS: 800.0000 mg | ORAL_TABLET | Freq: Three times a day (TID) | ORAL | 2 refills | Status: DC | PRN

## 2018-12-19 NOTE — Patient Instructions (Addendum)
Generalized Anxiety Disorder, Adult Generalized anxiety disorder (GAD) is a mental health disorder. People with this condition constantly worry about everyday events. Unlike normal anxiety, worry related to GAD is not triggered by a specific event. These worries also do not fade or get better with time. GAD interferes with life functions, including relationships, work, and school. GAD can vary from mild to severe. People with severe GAD can have intense waves of anxiety with physical symptoms (panic attacks). What are the causes? The exact cause of GAD is not known. What increases the risk? This condition is more likely to develop in:  Women.  People who have a family history of anxiety disorders.  People who are very shy.  People who experience very stressful life events, such as the death of a loved one.  People who have a very stressful family environment. What are the signs or symptoms? People with GAD often worry excessively about many things in their lives, such as their health and family. They may also be overly concerned about:  Doing well at work.  Being on time.  Natural disasters.  Friendships. Physical symptoms of GAD include:  Fatigue.  Muscle tension or having muscle twitches.  Trembling or feeling shaky.  Being easily startled.  Feeling like your heart is pounding or racing.  Feeling out of breath or like you cannot take a deep breath.  Having trouble falling asleep or staying asleep.  Sweating.  Nausea, diarrhea, or irritable bowel syndrome (IBS).  Headaches.  Trouble concentrating or remembering facts.  Restlessness.  Irritability. How is this diagnosed? Your health care provider can diagnose GAD based on your symptoms and medical history. You will also have a physical exam. The health care provider will ask specific questions about your symptoms, including how severe they are, when they started, and if they come and go. Your health care  provider may ask you about your use of alcohol or drugs, including prescription medicines. Your health care provider may refer you to a mental health specialist for further evaluation. Your health care provider will do a thorough examination and may perform additional tests to rule out other possible causes of your symptoms. To be diagnosed with GAD, a person must have anxiety that:  Is out of his or her control.  Affects several different aspects of his or her life, such as work and relationships.  Causes distress that makes him or her unable to take part in normal activities.  Includes at least three physical symptoms of GAD, such as restlessness, fatigue, trouble concentrating, irritability, muscle tension, or sleep problems. Before your health care provider can confirm a diagnosis of GAD, these symptoms must be present more days than they are not, and they must last for six months or longer. How is this treated? The following therapies are usually used to treat GAD:  Medicine. Antidepressant medicine is usually prescribed for long-term daily control. Antianxiety medicines may be added in severe cases, especially when panic attacks occur.  Talk therapy (psychotherapy). Certain types of talk therapy can be helpful in treating GAD by providing support, education, and guidance. Options include: ? Cognitive behavioral therapy (CBT). People learn coping skills and techniques to ease their anxiety. They learn to identify unrealistic or negative thoughts and behaviors and to replace them with positive ones. ? Acceptance and commitment therapy (ACT). This treatment teaches people how to be mindful as a way to cope with unwanted thoughts and feelings. ? Biofeedback. This process trains you to manage your body's response (  physiological response) through breathing techniques and relaxation methods. You will work with a therapist while machines are used to monitor your physical symptoms.  Stress  management techniques. These include yoga, meditation, and exercise. A mental health specialist can help determine which treatment is best for you. Some people see improvement with one type of therapy. However, other people require a combination of therapies. Follow these instructions at home:  Take over-the-counter and prescription medicines only as told by your health care provider.  Try to maintain a normal routine.  Try to anticipate stressful situations and allow extra time to manage them.  Practice any stress management or self-calming techniques as taught by your health care provider.  Do not punish yourself for setbacks or for not making progress.  Try to recognize your accomplishments, even if they are small.  Keep all follow-up visits as told by your health care provider. This is important. Contact a health care provider if:  Your symptoms do not get better.  Your symptoms get worse.  You have signs of depression, such as: ? A persistently sad, cranky, or irritable mood. ? Loss of enjoyment in activities that used to bring you joy. ? Change in weight or eating. ? Changes in sleeping habits. ? Avoiding friends or family members. ? Loss of energy for normal tasks. ? Feelings of guilt or worthlessness. Get help right away if:  You have serious thoughts about hurting yourself or others. If you ever feel like you may hurt yourself or others, or have thoughts about taking your own life, get help right away. You can go to your nearest emergency department or call:  Your local emergency services (911 in the [place]  A suicide crisis helpline, such as the [place] at (941)310-0371. This is open 24 hours a day. Summary  Generalized anxiety disorder (GAD) is a mental health disorder that involves worry that is not triggered by a specific event.  People with GAD often worry excessively about many things in their lives, such as their health and  family.  GAD may cause physical symptoms such as restlessness, trouble concentrating, sleep problems, frequent sweating, nausea, diarrhea, headaches, and trembling or muscle twitching.  A mental health specialist can help determine which treatment is best for you. Some people see improvement with one type of therapy. However, other people require a combination of therapies. This information is not intended to replace advice given to you by your health care provider. Make sure you discuss any questions you have with your health care provider. Document Released: 01/14/2013 Document Revised: 08/09/2016 Document Reviewed: 08/09/2016 Elsevier Interactive Patient Education  2019 [employer] Buspirone tablets What is this medicine? BUSPIRONE (byoo SPYE rone) is used to treat anxiety disorders. This medicine may be used for other purposes; ask your health care provider or pharmacist if you have questions. COMMON BRAND NAME(S): BuSpar What should I tell my health care provider before I take this medicine? They need to know if you have any of these conditions: -kidney or liver disease -an unusual or allergic reaction to buspirone, other medicines, foods, dyes, or preservatives -pregnant or trying to get pregnant -breast-feeding How should I use this medicine? Take this medicine by mouth with a glass of water. Follow the directions on the prescription label. You may take this medicine with or without food. To ensure that this medicine always works the same way for you, you should take it either always with or always without food. Take your doses at regular intervals.  Do not take your medicine more often than directed. Do not stop taking except on the advice of your doctor or health care professional. Talk to your pediatrician regarding the use of this medicine in children. Special care may be needed. Overdosage: If you think you have taken too much of this medicine contact a poison control center or  emergency room at once. NOTE: This medicine is only for you. Do not share this medicine with others. What if I miss a dose? If you miss a dose, take it as soon as you can. If it is almost time for your next dose, take only that dose. Do not take double or extra doses. What may interact with this medicine? Do not take this medicine with any of the following medications: -linezolid -MAOIs like Carbex, Eldepryl, Marplan, Nardil, and Parnate -methylene blue -procarbazine This medicine may also interact with the following medications: -diazepam -digoxin -diltiazem -erythromycin -grapefruit juice -haloperidol -medicines for mental depression or mood problems -medicines for seizures like carbamazepine, phenobarbital and phenytoin -nefazodone -other medications for anxiety -rifampin -ritonavir -some antifungal medicines like itraconazole, ketoconazole, and voriconazole -verapamil -warfarin This list may not describe all possible interactions. Give your health care provider a list of all the medicines, herbs, non-prescription drugs, or dietary supplements you use. Also tell them if you smoke, drink alcohol, or use illegal drugs. Some items may interact with your medicine. What should I watch for while using this medicine? Visit your doctor or health care professional for regular checks on your progress. It may take 1 to 2 weeks before your anxiety gets better. You may get drowsy or dizzy. Do not drive, use machinery, or do anything that needs mental alertness until you know how this drug affects you. Do not stand or sit up quickly, especially if you are an older patient. This reduces the risk of dizzy or fainting spells. Alcohol can make you more drowsy and dizzy. Avoid alcoholic drinks. What side effects may I notice from receiving this medicine? Side effects that you should report to your doctor or health care professional as soon as possible: -blurred vision or other vision changes -chest  pain -confusion -difficulty breathing -feelings of hostility or anger -muscle aches and pains -numbness or tingling in hands or feet -ringing in the ears -skin rash and itching -vomiting -weakness Side effects that usually do not require medical attention (report to your doctor or health care professional if they continue or are bothersome): -disturbed dreams, nightmares -headache -nausea -restlessness or nervousness -sore throat and nasal congestion -stomach upset This list may not describe all possible side effects. Call your doctor for medical advice about side effects. You may report side effects to FDA at 1-800-FDA-1088. Where should I keep my medicine? Keep out of the reach of children. Store at room temperature below 30 degrees C (86 degrees F). Protect from light. Keep container tightly closed. Throw away any unused medicine after the expiration date. NOTE: This sheet is a summary. It may not cover all possible information. If you have questions about this medicine, talk to your doctor, pharmacist, or health care provider.  2019 Elsevier/Gold Standard (2010-04-29 18:06:11)  Hydrocele, Adult A hydrocele is a collection of fluid in the loose pouch of skin that holds the testicles (scrotum). This may happen because:  The amount of fluid produced in the scrotum is not absorbed by the rest of the body.  Fluid from the abdomen fills the scrotum. Normally, the testicles develop in the abdomen then move (drop) into  to the scrotum before birth. The tube that the testicles travel through usually closes after the testicles drop. If the tube does not close, fluid from the abdomen can fill the scrotum. This is less common in adults. What are the causes? The cause of a hydrocele in adults is usually not known. However, it may be caused by:  An injury to the scrotum.  An infection (epididymitis).  Decreased blood flow to the scrotum.  Twisting of a testicle (testicular torsion).  A  birth defect.  A tumor or cancer of the testicle. What are the signs or symptoms? A hydrocele feels like a water-filled balloon. It may also feel heavy. Other symptoms include:  Swelling of the scrotum. The swelling may decrease when you lie down. You may also notice more swelling at night than in the morning.  Swelling of the groin.  Mild discomfort in the scrotum.  Pain. This can develop if the hydrocele was caused by infection or twisting. The larger the hydrocele, the more likely you are to have pain. How is this diagnosed? This condition may be diagnosed based on:  Physical exam.  Medical history. You may also have other tests, including:  Imaging tests, such as ultrasound.  Blood or urine tests. How is this treated? Most hydroceles go away on their own. If you have no discomfort or pain, your health care provider may suggest close monitoring of your condition (called watch and wait or watchful waiting) until the condition goes away or symptoms develop. If treatment is needed, it may include:  Treating an underlying condition. This may include using an antibiotic medicine to treat an infection.  Surgery to stop fluid from collecting in the scrotum.  Surgery to drain the fluid. Options include: ? Needle aspiration. A needle is used to drain fluid. However, the fluid buildup will come back quickly. ? Hydrocelectomy. For this procedure, an incision is made in the scrotum to remove the fluid sac. Follow these instructions at home:  Watch the hydrocele for any changes.  Take over-the-counter and prescription medicines only as told by your health care provider.  If you were prescribed an antibiotic medicine, use it as told by your health care provider. Do not stop taking the antibiotic even if you start to feel better.  Keep all follow-up visits as told by your health care provider. This is important. Contact a health care provider if:  You notice any changes in the  hydrocele.  The swelling in your scrotum or groin gets worse.  The hydrocele becomes red, firm, painful, or tender to the touch.  You have a fever. Get help right away if you:  Develop a lot of pain, or your pain becomes worse. Summary  A hydrocele is a collection of fluid in the loose pouch of skin that holds the testicles (scrotum).  Hydroceles can cause swelling, discomfort, and sometimes pain.  In adults, the cause of a hydrocele usually is not known. However, it is sometimes caused by an infection or a rotation and twisting of the scrotum.  Treatment is usually not needed. Hydroceles often go away on their own. If a hydrocele causes pain, treatment may be given to ease the pain. This information is not intended to replace advice given to you by your health care provider. Make sure you discuss any questions you have with your health care provider. Document Released: 03/09/2010 Document Revised: 09/30/2017 Document Reviewed: 09/30/2017 Elsevier Interactive Patient Education  2019 [place]. Ibuprofen tablets and capsules What is this  medicine? IBUPROFEN (eye BYOO proe fen) is a non-steroidal anti-inflammatory drug (NSAID). It is used for dental pain, fever, headaches or migraines, osteoarthritis, rheumatoid arthritis, or painful monthly periods. It can also relieve minor aches and pains caused by a cold, flu, or sore throat. This medicine may be used for other purposes; ask your health care provider or pharmacist if you have questions. COMMON BRAND NAME(S): Advil, Advil Junior Strength, Advil Migraine, Genpril, Ibren, IBU, Midol, Midol Cramps and Body Aches, Motrin, Motrin IB, Motrin Junior Strength, Motrin Migraine Pain, Samson-8, Toxicology Saliva Collection What should I tell my health care provider before I take this medicine? They need to know if you have any of these conditions: -cigarette smoker -coronary artery bypass graft (CABG) surgery within the past 2 weeks -drink  more than 3 alcohol-containing drinks a day -heart disease -high blood pressure -history of stomach bleeding -kidney disease -liver disease -lung or breathing disease, like asthma -an unusual or allergic reaction to ibuprofen, aspirin, other NSAIDs, other medicines, foods, dyes, or preservatives -pregnant or trying to get pregnant -breast-feeding How should I use this medicine? Take this medicine by mouth with a glass of water. Follow the directions on the prescription label. Take this medicine with food if your stomach gets upset. Try to not lie down for at least 10 minutes after you take the medicine. Take your medicine at regular intervals. Do not take your medicine more often than directed. A special MedGuide will be given to you by the pharmacist with each prescription and refill. Be sure to read this information carefully each time. Talk to your pediatrician regarding the use of this medicine in children. Special care may be needed. Overdosage: If you think you have taken too much of this medicine contact a poison control center or emergency room at once. NOTE: This medicine is only for you. Do not share this medicine with others. What if I miss a dose? If you miss a dose, take it as soon as you can. If it is almost time for your next dose, take only that dose. Do not take double or extra doses. What may interact with this medicine? Do not take this medicine with any of the following medications: -cidofovir -ketorolac -methotrexate -pemetrexed This medicine may also interact with the following medications: -alcohol -aspirin -diuretics -lithium -other drugs for inflammation like prednisone -warfarin This list may not describe all possible interactions. Give your health care provider a list of all the medicines, herbs, non-prescription drugs, or dietary supplements you use. Also tell them if you smoke, drink alcohol, or use illegal drugs. Some items may interact with your  medicine. What should I watch for while using this medicine? Tell your doctor or healthcare professional if your symptoms do not start to get better or if they get worse. This medicine does not prevent heart attack or stroke. In fact, this medicine may increase the chance of a heart attack or stroke. The chance may increase with longer use of this medicine and in people who have heart disease. If you take aspirin to prevent heart attack or stroke, talk with your doctor or health care professional. Do not take other medicines that contain aspirin, ibuprofen, or naproxen with this medicine. Side effects such as stomach upset, nausea, or ulcers may be more likely to occur. Many medicines available without a prescription should not be taken with this medicine. This medicine can cause ulcers and bleeding in the stomach and intestines at any time during treatment. Ulcers and bleeding can  happen without warning symptoms and can cause death. To reduce your risk, do not smoke cigarettes or drink alcohol while you are taking this medicine. You may get drowsy or dizzy. Do not drive, use machinery, or do anything that needs mental alertness until you know how this medicine affects you. Do not stand or sit up quickly, especially if you are an older patient. This reduces the risk of dizzy or fainting spells. This medicine can cause you to bleed more easily. Try to avoid damage to your teeth and gums when you brush or floss your teeth. This medicine may be used to treat migraines. If you take migraine medicines for 10 or more days a month, your migraines may get worse. Keep a diary of headache days and medicine use. Contact your healthcare professional if your migraine attacks occur more frequently. What side effects may I notice from receiving this medicine? Side effects that you should report to your doctor or health care professional as soon as possible: -allergic reactions like skin rash, itching or hives, swelling  of the face, lips, or tongue -severe stomach pain -signs and symptoms of bleeding such as bloody or black, tarry stools; red or dark-brown urine; spitting up blood or brown material that looks like coffee grounds; red spots on the skin; unusual bruising or bleeding from the eye, gums, or nose -signs and symptoms of a blood clot such as changes in vision; chest pain; severe, sudden headache; trouble speaking; sudden numbness or weakness of the face, arm, or leg -unexplained weight gain or swelling -unusually weak or tired -yellowing of eyes or skin Side effects that usually do not require medical attention (report to your doctor or health care professional if they continue or are bothersome): -bruising -diarrhea -dizziness, drowsiness -headache -nausea, vomiting This list may not describe all possible side effects. Call your doctor for medical advice about side effects. You may report side effects to FDA at 1-800-FDA-1088. Where should I keep my medicine? Keep out of the reach of children. Store at room temperature between 15 and 30 degrees C (59 and 86 degrees F). Keep container tightly closed. Throw away any unused medicine after the expiration date. NOTE: This sheet is a summary. It may not cover all possible information. If you have questions about this medicine, talk to your doctor, pharmacist, or health care provider.  2019 Elsevier/Gold Standard (2017-05-24 12:43:57)

## 2018-12-19 NOTE — Progress Notes (Signed)
Patient [name]  Established Patient Office Visit  Subjective:  Patient ID: Stanley [name], male    DOB: 22-Jun-1976  Age: 43 y.o. MRN: [id number]  CC:  Chief Complaint  Patient presents with  . Follow-up    ultrasound results    HPI Stanley [name] is a 43 year old male who presents for Follow Up today.   Past Medical History:  Diagnosis Date  . Anxiety   . CKD (chronic kidney disease), stage IV ([place])   . Depression   . Dyslexia   . High cholesterol   . Hypertension   . Hypertensive urgency   . Stroke ([place]) 06/03/2017   "minor; completely recovered today" (06/13/2017)  . Tobacco abuse    Current Status: Since his last office visit, he is doing well with no complaints. He reports occasHe cough and mild chest pain. He denies visual changes, chest pain, cough, shortness of breath, heart palpitations, and falls. He has occasional headaches and dizziness with position changes. Denies severe headaches, confusion, seizures, double vision, and blurred vision, nausea and vomiting.He reports visual changes. He has c/o swollen scrotum today.  He denies fevers, chills, fatigue, recent infections, weight loss, and night sweats.  No reports of GI problems such as diarrhea, and constipation. He denies pain today.   Past Surgical History:  Procedure Laterality Date  . NO PAST SURGERIES      Family History  Problem Relation Age of Onset  . Hypertension Mother   . Hypertension Brother   . Diabetes type II Brother     Social History   Socioeconomic History  . Marital status: Married    Spouse name: Not on file  . Number of children: Not on file  . Years of education: Not on file  . Highest education level: Not on file  Occupational History  . Not on file  Social Needs  . Financial resource strain: Not on file  . Food insecurity:    Worry: Not on file    Inability: Not on file  . Transportation needs:    Medical: Not on file   Non-medical: Not on file  Tobacco Use  . Smoking status: Current Every Day Smoker    Packs/day: 1.00    Years: 30.00    Pack years: 30.00    Types: Cigarettes  . Smokeless tobacco: Never Used  Substance and Sexual Activity  . Alcohol use: No  . Drug use: Yes    Types: Marijuana    Comment: 06/13/2017 "not often"  . Sexual activity: Not Currently  Lifestyle  . Physical activity:    Days per week: Not on file    Minutes per session: Not on file  . Stress: Not on file  Relationships  . Social connections:    Talks on phone: Not on file    Gets together: Not on file    Attends religious service: Not on file    Active member of club or organization: Not on file    Attends meetings of clubs or organizations: Not on file    Relationship status: Not on file  . Intimate partner violence:    Fear of current or ex partner: Not on file    Emotionally abused: Not on file    Physically abused: Not on file    Forced sexual activity: Not on file  Other Topics Concern  . Not on file  Social History Narrative  . Not on file  Outpatient Medications Prior to Visit  Medication Sig Dispense Refill  . acetaminophen (TYLENOL) 500 MG tablet Take 1,000 mg by mouth as needed for mild pain.    Marland Kitchen albuterol (PROVENTIL HFA;VENTOLIN HFA) 108 (90 Base) MCG/ACT inhaler Inhale 2 puffs into the lungs every 4 (four) hours as needed for wheezing or shortness of breath (cough, shortness of breath or wheezing.). 1 Inhaler 1  . amLODipine (NORVASC) 10 MG tablet Take 1 tablet (10 mg total) by mouth daily. 90 tablet 3  . aspirin EC 81 MG tablet Take 81 mg by mouth daily.    Marland Kitchen atorvastatin (LIPITOR) 40 MG tablet Take 1 tablet (40 mg total) by mouth daily at 6 PM. 90 tablet 3  . cloNIDine (CATAPRES) 0.3 MG tablet Take 1 tablet (0.3 mg total) by mouth 3 (three) times daily. 270 tablet 3  . furosemide (LASIX) 20 MG tablet Take 1 tablet (20 mg total) by mouth daily as needed. 90 tablet 3  . gemfibrozil (LOPID) 600  MG tablet Take 1 tablet (600 mg total) by mouth 2 (two) times daily before a meal. 180 tablet 3  . hydrALAZINE (APRESOLINE) 100 MG tablet Take 1 tablet (100 mg total) by mouth 3 (three) times daily. 270 tablet 3  . ibuprofen (ADVIL,MOTRIN) 800 MG tablet Take 1 tablet (800 mg total) by mouth every 8 (eight) hours as needed. 30 tablet 2  . isosorbide mononitrate (IMDUR) 30 MG 24 hr tablet Take 1 tablet (30 mg total) by mouth daily. 90 tablet 3  . labetalol (NORMODYNE) 200 MG tablet Take 2 tablets (400 mg total) by mouth 3 (three) times daily. 270 tablet 3  . metoprolol succinate (TOPROL-XL) 50 MG 24 hr tablet Take 1 tablet (50 mg total) by mouth daily. Take with or immediately following a meal. 90 tablet 3  . Multiple Vitamin (MULTIVITAMIN WITH MINERALS) TABS tablet Take 1 tablet by mouth daily as needed.    . nicotine (NICODERM CQ - DOSED IN MG/24 HOURS) 14 mg/24hr patch Place 1 patch (14 mg total) onto the skin daily. 28 patch 0  . potassium chloride SA (K-DUR,KLOR-CON) 20 MEQ tablet TAKE 1 TABLET BY MOUTH DAILY. 30 tablet 3  . traZODone (DESYREL) 100 MG tablet Take 1 tablet (100 mg total) by mouth at bedtime as needed for sleep. 30 tablet 3   No facility-administered medications prior to visit.     Allergies  Allergen Reactions  . Lactose Intolerance (Gi) Nausea And Vomiting  . Other Swelling    All beans  . Nitroglycerin Anxiety and Other (See Comments)    Pain and feels like he is on fire    ROS Review of Systems  Constitutional: Negative.   HENT: Negative.   Eyes: Positive for visual disturbance.  Respiratory: Negative.   Cardiovascular: Negative.   Gastrointestinal: Negative.   Endocrine: Negative.   Genitourinary: Positive for scrotal swelling (bilateral L>R).  Musculoskeletal: Negative.   Skin: Negative.   Neurological: Positive for dizziness and headaches.  Hematological: Negative.   Psychiatric/Behavioral: The patient is nervous/anxious.    Objective:    Physical  Exam  Constitutional: He is oriented to person, place, and time. He appears well-developed and well-nourished.  HENT:  Head: Normocephalic and atraumatic.  Eyes: Conjunctivae are normal.  Neck: Normal range of motion. Neck supple.  Cardiovascular: Normal rate, regular rhythm, normal heart sounds and intact distal pulses.  Pulmonary/Chest: Effort normal and breath sounds normal.  Abdominal: Soft. Bowel sounds are normal.  Musculoskeletal: Normal range of motion.  Neurological: He is alert and oriented to person, place, and time. He has normal reflexes.  Skin: Skin is warm and dry.  Psychiatric: His behavior is normal. Judgment and thought content normal.  anxiety   BP (!) 164/99 (BP Location: Left Arm, Patient Position: Sitting, Cuff Size: Normal)   Pulse 68   Temp 98.1 F (36.7 C) (Oral)   Resp 18   Ht 5\' 11"  (1.803 m)   Wt 211 lb (95.7 kg)   SpO2 98%   BMI 29.43 kg/m  Wt Readings from Last 3 Encounters:  12/19/18 211 lb (95.7 kg)  11/21/18 211 lb (95.7 kg)  11/14/18 211 lb (95.7 kg)     Health Maintenance Due  Topic Date Due  . TETANUS/TDAP  07/16/1996    There are no preventive care reminders to display for this patient.  Lab Results  Component Value Date   TSH 1.310 03/01/2018   Lab Results  Component Value Date   WBC 5.1 05/18/2018   HGB 13.0 05/18/2018   HCT 41.0 05/18/2018   MCV 88.9 05/18/2018   PLT 263 05/18/2018   Lab Results  Component Value Date   NA 137 05/18/2018   K 4.0 05/18/2018   CO2 23 05/18/2018   GLUCOSE 90 05/18/2018   BUN 32 (H) 05/18/2018   CREATININE 3.24 (H) 05/18/2018   BILITOT 0.3 03/01/2018   ALKPHOS 92 03/01/2018   AST 13 03/01/2018   ALT 10 03/01/2018   PROT 7.3 03/01/2018   ALBUMIN 4.5 03/01/2018   CALCIUM 9.1 05/18/2018   ANIONGAP 9 05/18/2018   Lab Results  Component Value Date   CHOL 96 (L) 03/01/2018   Lab Results  Component Value Date   HDL 40 03/01/2018   Lab Results  Component Value Date   LDLCALC 44  03/01/2018   Lab Results  Component Value Date   TRIG 62 03/01/2018   Lab Results  Component Value Date   CHOLHDL 2.4 03/01/2018   Lab Results  Component Value Date   HGBA1C 4.9 03/01/2018     Assessment & Plan:  1. Anxiety We will initiate Bupar toey - busPIRone (BUSPAR) 5 MG tablet; Take 1 tablet (5 mg total) by mouth 2 (two) times daily.  Dispense: 60 tablet; Refill: 1 - Ambulatory referral to Psychiatry  2. Hydrocele, bilateral Initiate Motrin today. - ibuprofen (ADVIL,MOTRIN) 800 MG tablet; Take 1 tablet (800 mg total) by mouth every 8 (eight) hours as needed.  Dispense: 30 tablet; Refill: 2  3. Acute pain of right knee We will initiate Motrin today.  - ibuprofen (ADVIL,MOTRIN) 800 MG tablet; Take 1 tablet (800 mg total) by mouth every 8 (eight) hours as needed.  Dispense: 30 tablet; Refill: 2  4. Inflammation of joint of right knee - ibuprofen (ADVIL,MOTRIN) 800 MG tablet; Take 1 tablet (800 mg total) by mouth every 8 (eight) hours as needed.  Dispense: 30 tablet; Refill: 2  5. Hypertension, accelerated Blood pressures are elevated today. Blood pressures are elevated today. Clonidine 0.2 mg given to patient in office and blood pressures remain elevated. He denies severe headaches, confusion, seizures, double vision, and blurred vision, nausea and vomiting. He will report to ED if he experiences these symptoms. Patient verbalized understanding.   - cloNIDine (CATAPRES) tablet 0.1 mg  6. Swollen testicle He will discontinue wearing boxer underwear and began wearing briefs to support scrotum. He will take Motrin as needed for pain.  7. Accelerated hypertension Blood pressure is elevated today. Continue antihypertensive medications . She  will continue to decrease high sodium intake, excessive alcohol intake, increase potassium intake, smoking cessation, and increase physical activity of at least 30 minutes of cardio activity daily. She will continue to follow Heart Healthy  or DASH diet. He will continue antihypertensive medications a prescribed.  8. Follow up He will follow up in 6 weeks to assess effectiveness of Buspar and Psychi referral.   Meds ordered this encounter  Medications  . busPIRone (BUSPAR) 5 MG tablet    Sig: Take 1 tablet (5 mg total) by mouth 2 (two) times daily.    Dispense:  60 tablet    Refill:  1  . ibuprofen (ADVIL,MOTRIN) 800 MG tablet    Sig: Take 1 tablet (800 mg total) by mouth every 8 (eight) hours as needed.    Dispense:  30 tablet    Refill:  2  . cloNIDine (CATAPRES) tablet 0.1 mg    Referral Orders     Ambulatory referral to Psychiatry   Kathe Becton,  MSN, FNP-C Patient [place],  [id number] [phone]   Problem List Items Addressed This Visit    None      No orders of the defined types were placed in this encounter.   Follow-up: No follow-ups on file.    [name], FNP

## 2018-12-25 MED FILL — AMLODIPINE BESYLATE 10 MG T: 10 | 30 days supply | Qty: 30 | Fill #4

## 2018-12-25 MED FILL — ?FUROSEMIDE 40 MG TABLET: 40 | 60 days supply | Qty: 60 | Fill #5

## 2018-12-31 ENCOUNTER — Other Ambulatory Visit: Payer: Self-pay | Admitting: Cardiology

## 2018-12-31 MED FILL — cloNIDine HCL 0.3 MG TABS: 0.3 | 30 days supply | Qty: 90 | Fill #6

## 2018-12-31 MED FILL — hydrALAZINE HCL 100 MG TABS: 100 | 30 days supply | Qty: 90 | Fill #6

## 2018-12-31 MED FILL — ATORVASTATIN CALCIUM 40 MG: 40 | 30 days supply | Qty: 30 | Fill #4

## 2018-12-31 MED FILL — traZODone HCL 100 MG TABS: 100 | 30 days supply | Qty: 30 | Fill #1

## 2018-12-31 MED FILL — METOPROLOL SUCCINATE ER 50: 50 | 30 days supply | Qty: 30 | Fill #6

## 2018-12-31 MED FILL — LABETALOL HCL 200 MG TABLET: 200 | 30 days supply | Qty: 180 | Fill #0

## 2019-01-26 ENCOUNTER — Encounter: Payer: Self-pay | Admitting: Family Medicine

## 2019-01-26 ENCOUNTER — Telehealth: Payer: Self-pay | Admitting: Family Medicine

## 2019-01-26 MED ORDER — FUROSEMIDE 20 MG PO TABS: 20.0000 mg | ORAL_TABLET | Freq: Every day | ORAL | 0 refills | Status: DC | PRN

## 2019-02-05 ENCOUNTER — Ambulatory Visit: Payer: Self-pay | Admitting: Family Medicine

## 2019-02-07 NOTE — Telephone Encounter (Signed)
Message sent to provider 

## 2019-02-11 MED FILL — GEMFIBROZIL 600 MG TAB: 600 | 30 days supply | Qty: 60 | Fill #6

## 2019-02-11 MED FILL — hydrALAZINE HCL 100 MG TABS: 100 | 30 days supply | Qty: 90 | Fill #7

## 2019-02-11 MED FILL — LABETALOL HCL 200 MG TABLET: 200 | 30 days supply | Qty: 180 | Fill #1

## 2019-02-11 MED FILL — ?ATORVASTATIN 40MG TABLET: 40 | 30 days supply | Qty: 30 | Fill #5

## 2019-02-11 MED FILL — cloNIDine HCL 0.3 MG TABS: 0.3 | 30 days supply | Qty: 90 | Fill #7

## 2019-02-11 MED FILL — ?METOPROLOL SUCC ER 50MG TA: 50 | 30 days supply | Qty: 30 | Fill #7

## 2019-02-18 MED FILL — traZODone HCL 100 MG TABS: 100 | 30 days supply | Qty: 30 | Fill #2

## 2019-02-20 ENCOUNTER — Ambulatory Visit (INDEPENDENT_AMBULATORY_CARE_PROVIDER_SITE_OTHER): Payer: Self-pay | Admitting: Family Medicine

## 2019-02-20 ENCOUNTER — Other Ambulatory Visit: Payer: Self-pay

## 2019-02-20 ENCOUNTER — Encounter: Payer: Self-pay | Admitting: Family Medicine

## 2019-02-20 VITALS — BP 214/108 | HR 62 | Temp 98.2°F | Ht 71.0 in | Wt 218.0 lb

## 2019-02-20 DIAGNOSIS — N433 Hydrocele, unspecified: Secondary | ICD-10-CM

## 2019-02-20 DIAGNOSIS — I10 Essential (primary) hypertension: Secondary | ICD-10-CM

## 2019-02-20 DIAGNOSIS — R002 Palpitations: Secondary | ICD-10-CM

## 2019-02-20 DIAGNOSIS — Z09 Encounter for follow-up examination after completed treatment for conditions other than malignant neoplasm: Secondary | ICD-10-CM

## 2019-02-20 DIAGNOSIS — R8290 Unspecified abnormal findings in urine: Secondary | ICD-10-CM

## 2019-02-20 DIAGNOSIS — F419 Anxiety disorder, unspecified: Secondary | ICD-10-CM

## 2019-02-20 LAB — POCT URINALYSIS DIP (MANUAL ENTRY)
Bilirubin, UA: NEGATIVE
Blood, UA: NEGATIVE
Glucose, UA: NEGATIVE mg/dL
Ketones, POC UA: NEGATIVE mg/dL
Nitrite, UA: NEGATIVE
Protein Ur, POC: >=300 mg/dL — AB
Spec Grav, UA: 1.025 (ref 1.010–1.025)
Urobilinogen, UA: 0.2 E.U./dL
pH, UA: 6 (ref 5.0–8.0)

## 2019-02-20 MED ORDER — CLONIDINE HCL 0.1 MG PO TABS
0.2000 mg | ORAL_TABLET | Freq: Once | ORAL | Status: AC
  Administered 2019-02-20: 0.2 mg via ORAL

## 2019-02-20 MED ORDER — CLONIDINE HCL 0.1 MG PO TABS
0.1000 mg | ORAL_TABLET | Freq: Once | ORAL | Status: AC
  Administered 2019-02-20: 0.1 mg via ORAL

## 2019-02-20 MED ORDER — NITROGLYCERIN 0.4 MG SL SUBL: 0.4000 mg | SUBLINGUAL_TABLET | SUBLINGUAL | 3 refills | Status: DC | PRN

## 2019-02-20 NOTE — Patient Instructions (Signed)
Nitroglycerin sublingual tablets  What is this medicine?  NITROGLYCERIN (nye troe GLI ser in) is a type of vasodilator. It relaxes blood vessels, increasing the blood and oxygen supply to your heart. This medicine is used to relieve chest pain caused by angina. It is also used to prevent chest pain before activities like climbing stairs, going outdoors in cold weather, or sexual activity.  This medicine may be used for other purposes; ask your health care provider or pharmacist if you have questions.  COMMON BRAND NAME(S): Nitroquick, Nitrostat, Nitrotab  What should I tell my health care provider before I take this medicine?  They need to know if you have any of these conditions:  -anemia  -head injury, recent stroke, or bleeding in the brain  -liver disease  -previous heart attack  -an unusual or allergic reaction to nitroglycerin, other medicines, foods, dyes, or preservatives  -pregnant or trying to get pregnant  -breast-feeding  How should I use this medicine?  Take this medicine by mouth as needed. At the first sign of an angina attack (chest pain or tightness) place one tablet under your tongue. You can also take this medicine 5 to 10 minutes before an event likely to produce chest pain. Follow the directions on the prescription label. Let the tablet dissolve under the tongue. Do not swallow whole. Replace the dose if you accidentally swallow it. It will help if your mouth is not dry. Saliva around the tablet will help it to dissolve more quickly. Do not eat or drink, smoke or chew tobacco while a tablet is dissolving. If you are not better within 5 minutes after taking ONE dose of nitroglycerin, call 9-1-1 immediately to seek emergency medical care. Do not take more than 3 nitroglycerin tablets over 15 minutes.  If you take this medicine often to relieve symptoms of angina, your doctor or health care professional may provide you with different instructions to manage your symptoms. If symptoms do not go away  after following these instructions, it is important to call 9-1-1 immediately. Do not take more than 3 nitroglycerin tablets over 15 minutes.  Talk to your pediatrician regarding the use of this medicine in children. Special care may be needed.  Overdosage: If you think you have taken too much of this medicine contact a poison control center or emergency room at once.  NOTE: This medicine is only for you. Do not share this medicine with others.  What if I miss a dose?  This does not apply. This medicine is only used as needed.  What may interact with this medicine?  Do not take this medicine with any of the following medications:  -certain migraine medicines like ergotamine and dihydroergotamine (DHE)  -medicines used to treat erectile dysfunction like sildenafil, tadalafil, and vardenafil  -riociguat  This medicine may also interact with the following medications:  -alteplase  -aspirin  -heparin  -medicines for high blood pressure  -medicines for mental depression  -other medicines used to treat angina  -phenothiazines like chlorpromazine, mesoridazine, prochlorperazine, thioridazine  This list may not describe all possible interactions. Give your health care provider a list of all the medicines, herbs, non-prescription drugs, or dietary supplements you use. Also tell them if you smoke, drink alcohol, or use illegal drugs. Some items may interact with your medicine.  What should I watch for while using this medicine?  Tell your doctor or health care professional if you feel your medicine is no longer working.  Keep this medicine with you at   all times. Sit or lie down when you take your medicine to prevent falling if you feel dizzy or faint after using it. Try to remain calm. This will help you to feel better faster. If you feel dizzy, take several deep breaths and lie down with your feet propped up, or bend forward with your head resting between your knees.  You may get drowsy or dizzy. Do not drive, use machinery,  or do anything that needs mental alertness until you know how this drug affects you. Do not stand or sit up quickly, especially if you are an older patient. This reduces the risk of dizzy or fainting spells. Alcohol can make you more drowsy and dizzy. Avoid alcoholic drinks.  Do not treat yourself for coughs, colds, or pain while you are taking this medicine without asking your doctor or health care professional for advice. Some ingredients may increase your blood pressure.  What side effects may I notice from receiving this medicine?  Side effects that you should report to your doctor or health care professional as soon as possible:  -blurred vision  -dry mouth  -skin rash  -sweating  -the feeling of extreme pressure in the head  -unusually weak or tired  Side effects that usually do not require medical attention (report to your doctor or health care professional if they continue or are bothersome):  -flushing of the face or neck  -headache  -irregular heartbeat, palpitations  -nausea, vomiting  This list may not describe all possible side effects. Call your doctor for medical advice about side effects. You may report side effects to FDA at 1-800-FDA-1088.  Where should I keep my medicine?  Keep out of the reach of children.  Store at room temperature between 20 and 25 degrees C (68 and 77 degrees F). Store in original container. Protect from light and moisture. Keep tightly closed. Throw away any unused medicine after the expiration date.  NOTE: This sheet is a summary. It may not cover all possible information. If you have questions about this medicine, talk to your doctor, pharmacist, or health care provider.   2019 [employer]vier/Gold Standard (2013-07-18 17:57:36)

## 2019-02-20 NOTE — Progress Notes (Signed)
Patient [facility]    Established Patient Office Visit  Subjective:  Patient ID: Stanley [name], male    DOB: September 05, 1976  Age: 43 y.o. MRN: [id number]  CC:  Chief Complaint  Patient presents with   Follow-up    chronic condition     HPI Stanley [name] is a 43 year old male who presents for follow up today.   Past Medical History:  Diagnosis Date   Anxiety    CKD (chronic kidney disease), stage IV (HCC)    Depression    Dyslexia    Heart palpitations    High cholesterol    Hypertension    Hypertensive urgency    Stroke ([place]) 06/03/2017   "minor; completely recovered today" (06/13/2017)   Tobacco abuse    Current Status: Since his last office visit, he is doing well with no complaints. Although his blood pressure readings are elevated today, he denies visual changes, chest pain, cough, shortness of breath, heart palpitations, and falls. He has occasional headaches and dizziness with position changes. Denies severe headaches, confusion, seizures, double vision, and blurred vision, nausea and vomiting. He states that he is taking all of his medications as prescribed. His anxiety is mild today. He denies suicidal ideations, homicidal ideations, or auditory hallucinations. He reports frequent intermittent heart palpitations and cough lately. He current smokes < 1/2 pack of cigarettes a day. He has not began Nicotine Patches as of yet.   He denies fevers, chills, fatigue, recent infections, weight loss, and night sweats.No reports of GI problems such as diarrhea, and constipation. He has no reports of blood in stools, dysuria and hematuria. He denies pain today.   Past Surgical History:  Procedure Laterality Date   NO PAST SURGERIES      Family History  Problem Relation Age of Onset   Hypertension Mother    Hypertension Brother    Diabetes type II Brother     Social History   Socioeconomic History   Marital status:  Married    Spouse name: Not on file   Number of children: Not on file   Years of education: Not on file   Highest education level: Not on file  Occupational History   Not on file  Social Needs   Financial resource strain: Not on file   Food insecurity:    Worry: Not on file    Inability: Not on file   Transportation needs:    Medical: Not on file    Non-medical: Not on file  Tobacco Use   Smoking status: Current Every Day Smoker    Packs/day: 1.00    Years: 30.00    Pack years: 30.00    Types: Cigarettes   Smokeless tobacco: Never Used  Substance and Sexual Activity   Alcohol use: No   Drug use: Yes    Types: Marijuana    Comment: 06/13/2017 "not often"   Sexual activity: Not Currently  Lifestyle   Physical activity:    Days per week: Not on file    Minutes per session: Not on file   Stress: Not on file  Relationships   Social connections:    Talks on phone: Not on file    Gets together: Not on file    Attends religious service: Not on file    Active member of club or organization: Not on file    Attends meetings of clubs or organizations: Not on file    Relationship status:  Not on file   Intimate partner violence:    Fear of current or ex partner: Not on file    Emotionally abused: Not on file    Physically abused: Not on file    Forced sexual activity: Not on file  Other Topics Concern   Not on file  Social History Narrative   Not on file    Outpatient Medications Prior to Visit  Medication Sig Dispense Refill   acetaminophen (TYLENOL) 500 MG tablet Take 1,000 mg by mouth as needed for mild pain.     albuterol (PROVENTIL HFA;VENTOLIN HFA) 108 (90 Base) MCG/ACT inhaler Inhale 2 puffs into the lungs every 4 (four) hours as needed for wheezing or shortness of breath (cough, shortness of breath or wheezing.). 1 Inhaler 1   amLODipine (NORVASC) 10 MG tablet Take 1 tablet (10 mg total) by mouth daily. 90 tablet 3   aspirin EC 81 MG tablet Take  81 mg by mouth daily.     atorvastatin (LIPITOR) 40 MG tablet Take 1 tablet (40 mg total) by mouth daily at 6 PM. 90 tablet 3   busPIRone (BUSPAR) 5 MG tablet Take 1 tablet (5 mg total) by mouth 2 (two) times daily. 60 tablet 1   cloNIDine (CATAPRES) 0.3 MG tablet Take 1 tablet (0.3 mg total) by mouth 3 (three) times daily. 270 tablet 3   gemfibrozil (LOPID) 600 MG tablet Take 1 tablet (600 mg total) by mouth 2 (two) times daily before a meal. 180 tablet 3   hydrALAZINE (APRESOLINE) 100 MG tablet Take 1 tablet (100 mg total) by mouth 3 (three) times daily. 270 tablet 3   ibuprofen (ADVIL,MOTRIN) 800 MG tablet Take 1 tablet (800 mg total) by mouth every 8 (eight) hours as needed. 30 tablet 2   isosorbide mononitrate (IMDUR) 30 MG 24 hr tablet Take 1 tablet (30 mg total) by mouth daily. 90 tablet 3   labetalol (NORMODYNE) 200 MG tablet TAKE 2 TABLETS (400 MG TOTAL) BY MOUTH 3 (THREE) TIMES DAILY. 540 tablet 1   metoprolol succinate (TOPROL-XL) 50 MG 24 hr tablet Take 1 tablet (50 mg total) by mouth daily. Take with or immediately following a meal. 90 tablet 3   Multiple Vitamin (MULTIVITAMIN WITH MINERALS) TABS tablet Take 1 tablet by mouth daily as needed.     potassium chloride SA (K-DUR,KLOR-CON) 20 MEQ tablet TAKE 1 TABLET BY MOUTH DAILY. 30 tablet 3   traZODone (DESYREL) 100 MG tablet Take 1 tablet (100 mg total) by mouth at bedtime as needed for sleep. 30 tablet 3   furosemide (LASIX) 20 MG tablet Take 1 tablet (20 mg total) by mouth daily as needed for up to 30 days. 30 tablet 0   nicotine (NICODERM CQ - DOSED IN MG/24 HOURS) 14 mg/24hr patch Place 1 patch (14 mg total) onto the skin daily. (Patient not taking: Reported on 02/20/2019) 28 patch 0   No facility-administered medications prior to visit.     Allergies  Allergen Reactions   Lactose Intolerance (Gi) Nausea And Vomiting   Other Swelling    All beans   Nitroglycerin Anxiety and Other (See Comments)    Pain and  feels like he is on fire    ROS Review of Systems  Constitutional: Negative.   HENT: Negative.   Eyes: Negative.   Respiratory: Negative.   Cardiovascular: Positive for palpitations (intermittent).  Gastrointestinal: Negative.   Endocrine: Negative.   Genitourinary: Negative.   Musculoskeletal: Negative.   Skin: Negative.  Allergic/Immunologic: Negative.   Neurological: Positive for dizziness and headaches.  Hematological: Negative.   Psychiatric/Behavioral: Negative.       Objective:    Physical Exam  Constitutional: He is oriented to person, place, and time. He appears well-developed and well-nourished.  HENT:  Head: Normocephalic and atraumatic.  Eyes: Conjunctivae are normal.  Neck: Normal range of motion. Neck supple.  Cardiovascular: Normal rate and normal heart sounds.  Pulmonary/Chest: Effort normal and breath sounds normal.  Abdominal: Soft. Bowel sounds are normal.  Genitourinary:    Genitourinary Comments: Scrotal swelling.    Musculoskeletal: Normal range of motion.  Neurological: He is alert and oriented to person, place, and time. He has normal reflexes.  Skin: Skin is warm and dry.  Psychiatric: He has a normal mood and affect. His behavior is normal. Judgment and thought content normal.  Nursing note and vitals reviewed.   BP (!) 214/108    Pulse 62    Temp 98.2 F (36.8 C) (Oral)    Ht 5\' 11"  (1.803 m)    Wt 218 lb (98.9 kg)    SpO2 99%    BMI 30.40 kg/m  Wt Readings from Last 3 Encounters:  02/20/19 218 lb (98.9 kg)  12/19/18 211 lb (95.7 kg)  11/21/18 211 lb (95.7 kg)     There are no preventive care reminders to display for this patient.  There are no preventive care reminders to display for this patient.  Lab Results  Component Value Date   TSH 1.310 03/01/2018   Lab Results  Component Value Date   WBC 5.1 05/18/2018   HGB 13.0 05/18/2018   HCT 41.0 05/18/2018   MCV 88.9 05/18/2018   PLT 263 05/18/2018   Lab Results  Component  Value Date   NA 137 05/18/2018   K 4.0 05/18/2018   CO2 23 05/18/2018   GLUCOSE 90 05/18/2018   BUN 32 (H) 05/18/2018   CREATININE 3.24 (H) 05/18/2018   BILITOT 0.3 03/01/2018   ALKPHOS 92 03/01/2018   AST 13 03/01/2018   ALT 10 03/01/2018   PROT 7.3 03/01/2018   ALBUMIN 4.5 03/01/2018   CALCIUM 9.1 05/18/2018   ANIONGAP 9 05/18/2018   Lab Results  Component Value Date   CHOL 96 (L) 03/01/2018   Lab Results  Component Value Date   HDL 40 03/01/2018   Lab Results  Component Value Date   LDLCALC 44 03/01/2018   Lab Results  Component Value Date   TRIG 62 03/01/2018   Lab Results  Component Value Date   CHOLHDL 2.4 03/01/2018   Lab Results  Component Value Date   HGBA1C 4.9 03/01/2018    Assessment & Plan:   1. Accelerated hypertension  2. Hypertension, unspecified type Blood pressures are elevated today. Clonidine 0.3 mg given to patient in office and blood pressures remain elevated. We referred him to ED via ambulance at this time. Patient refused and signed AMA form at discharge. He denies severe headaches, confusion, seizures, double vision, and blurred vision, nausea and vomiting. He will report to ED if he experiences these symptoms. Patient verbalized understanding.   - POCT urinalysis dipstick - cloNIDine (CATAPRES) tablet 0.2 mg - cloNIDine (CATAPRES) tablet 0.1 mg  3. Heart palpitations ECG today revealed ischemia, We will initiate Nitrostat today. He will follow up with Dr.Skains in Cardiology.  - nitroGLYCERIN (NITROSTAT) 0.4 MG SL tablet; Place 1 tablet (0.4 mg total) under the tongue every 5 (five) minutes as needed for chest pain.  Dispense:  50 tablet; Refill: 3  4. Abnormal urinalysis Results are pending.  - Urine Culture  5. Hydrocele, bilateral  6. Chronic hydrocele - Ambulatory referral to Urology  7. Anxiety  8. Follow up He will follow up in 1 week for blood pressure checi only.   Meds ordered this encounter  Medications    cloNIDine (CATAPRES) tablet 0.2 mg   cloNIDine (CATAPRES) tablet 0.1 mg   nitroGLYCERIN (NITROSTAT) 0.4 MG SL tablet    Sig: Place 1 tablet (0.4 mg total) under the tongue every 5 (five) minutes as needed for chest pain.    Dispense:  50 tablet    Refill:  3    Orders Placed This Encounter  Procedures   Urine Culture   Ambulatory referral to Urology   POCT urinalysis dipstick     Referral Orders     Ambulatory referral to Urology   Kathe Becton,  MSN, [address] [place] [place], [place] [address] [phone]   Problem List Items Addressed This Visit      Cardiovascular and Mediastinum   Hypertension   Relevant Medications   cloNIDine (CATAPRES) tablet 0.2 mg (Completed)   cloNIDine (CATAPRES) tablet 0.1 mg (Completed)   nitroGLYCERIN (NITROSTAT) 0.4 MG SL tablet   Other Relevant Orders   POCT urinalysis dipstick (Completed)     Other   Anxiety    Other Visit Diagnoses    Accelerated hypertension    -  Primary   Relevant Medications   cloNIDine (CATAPRES) tablet 0.2 mg (Completed)   cloNIDine (CATAPRES) tablet 0.1 mg (Completed)   nitroGLYCERIN (NITROSTAT) 0.4 MG SL tablet   Heart palpitations       Relevant Medications   nitroGLYCERIN (NITROSTAT) 0.4 MG SL tablet   Abnormal urinalysis       Relevant Orders   Urine Culture (Completed)   Hydrocele, bilateral       Chronic hydrocele       Relevant Orders   Ambulatory referral to Urology   Follow up          Meds ordered this encounter  Medications   cloNIDine (CATAPRES) tablet 0.2 mg   cloNIDine (CATAPRES) tablet 0.1 mg   nitroGLYCERIN (NITROSTAT) 0.4 MG SL tablet    Sig: Place 1 tablet (0.4 mg total) under the tongue every 5 (five) minutes as needed for chest pain.    Dispense:  50 tablet    Refill:  3    Follow-up: No follow-ups on file.    [name], FNP

## 2019-02-21 ENCOUNTER — Telehealth: Payer: Self-pay

## 2019-02-21 MED ORDER — FUROSEMIDE 20 MG PO TABS: 20.0000 mg | ORAL_TABLET | Freq: Every day | ORAL | 2 refills | Status: DC | PRN

## 2019-02-21 MED FILL — ?FUROSEMIDE 20 MG TABLET: 20 | 30 days supply | Qty: 30 | Fill #0

## 2019-02-21 NOTE — Telephone Encounter (Signed)
Medication sent to pharmacy. Patient mom is aware

## 2019-02-21 NOTE — Telephone Encounter (Signed)
Patient were notified and state that they already have one

## 2019-02-21 NOTE — Telephone Encounter (Signed)
Patient requesting a refill on Lasix. Is it okay to send medication.

## 2019-02-21 NOTE — Telephone Encounter (Signed)
-----   Message from [name], [place] sent at 02/04/2019 10:50 AM EDT ----- Regarding: "Blood Pressure Monitors" Morey Hummingbird,   Please contact patient and inform that he is eligible for Blood Pressure monitor, if he has Medicaid from [employer] [phone]). He can also inform his brothers. Thank you.

## 2019-02-22 LAB — URINE CULTURE

## 2019-02-23 DIAGNOSIS — R002 Palpitations: Secondary | ICD-10-CM | POA: Insufficient documentation

## 2019-02-27 NOTE — Telephone Encounter (Signed)
Patient mom will call and schedule follow up cardiology

## 2019-02-27 NOTE — Telephone Encounter (Signed)
-----   Message from [name], [place] sent at 02/23/2019 10:54 PM EDT ----- Regarding: "Follow Up" Please remind patient to schedule follow up appointment with Dr. Candee Furbish in Cardiology.

## 2019-03-04 ENCOUNTER — Telehealth: Payer: Self-pay

## 2019-03-04 NOTE — Telephone Encounter (Signed)
YOUR CARDIOLOGY TEAM HAS ARRANGED FOR AN E-VISIT FOR YOUR APPOINTMENT - PLEASE REVIEW IMPORTANT INFORMATION BELOW SEVERAL DAYS PRIOR TO YOUR APPOINTMENT  Due to the recent COVID-19 pandemic, we are transitioning in-person office visits to tele-medicine visits in an effort to decrease unnecessary exposure to our patients, their families, and staff. These visits are billed to your insurance just like a normal visit is. We also encourage you to sign up for MyChart if you have not already done so. You will need a smartphone if possible. For patients that do not have this, we can still complete the visit using a regular telephone but do prefer a smartphone to enable video when possible. You may have a family member that lives with you that can help. If possible, we also ask that you have a blood pressure cuff and scale at home to measure your blood pressure, heart rate and weight prior to your scheduled appointment. Patients with clinical needs that need an in-person evaluation and testing will still be able to come to the office if absolutely necessary. If you have any questions, feel free to call our office.     YOUR PROVIDER WILL BE USING THE FOLLOWING PLATFORM TO COMPLETE YOUR VISIT: [email or web address]  . IF USING MYCHART - How to Download the MyChart App to Your SmartPhone   - If Apple, go to App Store and type in MyChart in the search bar and download the app. If Android, ask patient to go to Google Play Store and type in MyChart in the search bar and download the app. The app is free but as with any other app downloads, your phone may require you to verify saved payment information or Apple/Android password.  - You will need to then log into the app with your MyChart username and password, and select [facility] as your healthcare provider to link the account.  - When it is time for your visit, go to the MyChart app, find appointments, and click Begin Video Visit. Be sure to Select Allow for your device to  access the Microphone and Camera for your visit. You will then be connected, and your provider will be with you shortly.  **If you have any issues connecting or need assistance, please contact MyChart service desk [phone] ([phone])**  **If using a computer, in order to ensure the best quality for your visit, you will need to use either of the following Internet Browsers: Google Chrome or Microsoft Edge**  . IF USING DOXIMITY or [email or web address] - The staff will give you instructions on receiving your link to join the meeting the day of your visit.      2-3 DAYS BEFORE YOUR APPOINTMENT  You will receive a telephone call from one of our [facility] team members - your caller ID may say "Unknown caller." If this is a video visit, we will walk you through how to get the video launched on your phone. We will remind you check your blood pressure, heart rate and weight prior to your scheduled appointment. If you have an Apple Watch or Kardia, please upload any pertinent ECG strips the day before or morning of your appointment to MyChart. Our staff will also make sure you have reviewed the consent and agree to move forward with your scheduled tele-health visit.     THE DAY OF YOUR APPOINTMENT  Approximately 15 minutes prior to your scheduled appointment, you will receive a telephone call from one of [facility] team - your caller ID may say "Unknown caller."    Our staff will confirm medications, vital signs for the day and any symptoms you may be experiencing. Please have this information available prior to the time of visit start. It may also be helpful for you to have a pad of paper and pen handy for any instructions given during your visit. They will also walk you through joining the smartphone meeting if this is a video visit.    CONSENT FOR TELE-HEALTH VISIT - PLEASE REVIEW  I hereby voluntarily request, consent and authorize [facility] and its employed or contracted physicians, physician  assistants, nurse practitioners or other licensed health care professionals (the Practitioner), to provide me with telemedicine health care services (the "Services") as deemed necessary by the treating Practitioner. I acknowledge and consent to receive the Services by the Practitioner via telemedicine. I understand that the telemedicine visit will involve communicating with the Practitioner through live audiovisual communication technology and the disclosure of certain medical information by electronic transmission. I acknowledge that I have been given the opportunity to request an in-person assessment or other available alternative prior to the telemedicine visit and am voluntarily participating in the telemedicine visit.  I understand that I have the right to withhold or withdraw my consent to the use of telemedicine in the course of my care at any time, without affecting my right to future care or treatment, and that the Practitioner or I may terminate the telemedicine visit at any time. I understand that I have the right to inspect all information obtained and/or recorded in the course of the telemedicine visit and may receive copies of available information for a reasonable fee.  I understand that some of the potential risks of receiving the Services via telemedicine include:  . Delay or interruption in medical evaluation due to technological equipment failure or disruption; . Information transmitted may not be sufficient (e.g. poor resolution of images) to allow for appropriate medical decision making by the Practitioner; and/or  . In rare instances, security protocols could fail, causing a breach of personal health information.  Furthermore, I acknowledge that it is my responsibility to provide information about my medical history, conditions and care that is complete and accurate to the best of my ability. I acknowledge that Practitioner's advice, recommendations, and/or decision may be based on  factors not within their control, such as incomplete or inaccurate data provided by me or distortions of diagnostic images or specimens that may result from electronic transmissions. I understand that the practice of medicine is not an exact science and that Practitioner makes no warranties or guarantees regarding treatment outcomes. I acknowledge that I will receive a copy of this consent concurrently upon execution via email to the email address I last provided but may also request a printed copy by calling the office of [facility].    I understand that my insurance will be billed for this visit.   I have read or had this consent read to me. . I understand the contents of this consent, which adequately explains the benefits and risks of the Services being provided via telemedicine.  . I have been provided ample opportunity to ask questions regarding this consent and the Services and have had my questions answered to my satisfaction. . I give my informed consent for the services to be provided through the use of telemedicine in my medical care  By participating in this telemedicine visit I agree to the above.  

## 2019-03-05 ENCOUNTER — Encounter: Payer: Self-pay | Admitting: Cardiology

## 2019-03-05 ENCOUNTER — Other Ambulatory Visit: Payer: Self-pay

## 2019-03-05 ENCOUNTER — Telehealth: Payer: Self-pay | Admitting: Pharmacist

## 2019-03-05 ENCOUNTER — Telehealth (INDEPENDENT_AMBULATORY_CARE_PROVIDER_SITE_OTHER): Payer: Self-pay | Admitting: Cardiology

## 2019-03-05 VITALS — BP 197/106 | HR 66 | Temp 97.9°F | Ht 71.0 in

## 2019-03-05 DIAGNOSIS — I10 Essential (primary) hypertension: Secondary | ICD-10-CM

## 2019-03-05 DIAGNOSIS — I517 Cardiomegaly: Secondary | ICD-10-CM

## 2019-03-05 DIAGNOSIS — N189 Chronic kidney disease, unspecified: Secondary | ICD-10-CM

## 2019-03-05 NOTE — Telephone Encounter (Signed)
Attempted to call patient to set up a date and time to complete HTN visit. Left VM for patient to call back

## 2019-03-05 NOTE — Progress Notes (Signed)
Virtual Visit via Video Note   This visit type was conducted due to national recommendations for restrictions regarding the COVID-19 Pandemic (e.g. social distancing) in an effort to limit this patient's exposure and mitigate transmission in our community.  Due to his co-morbid illnesses, this patient is at least at moderate risk for complications without adequate follow up.  This format is felt to be most appropriate for this patient at this time.  All issues noted in this document were discussed and addressed.  A limited physical exam was performed with this format.  Please refer to the patient's chart for his consent to telehealth for [facility].   Date:  03/05/2019   ID:  Stanley [name], DOB 1976-02-11, MRN [id number]  Patient Location: Home Provider Location: Home  PCP:  [name], FNP  Cardiologist:  Candee Furbish, MD  Electrophysiologist:  None   Evaluation Performed:  Follow-Up Visit  Chief Complaint:  HTN  History of Present Illness:    Stanley [name] is a 43 y.o. male with HTN follow up.  Back in 2017 he was admitted with shortness of breath and hypertensive emergency with acute kidney injury.  Severe concentric hypertrophy was normal ejection fraction was noted.  Creatinine 3.9.  Patient's mother [name] was called secondary to recent chest pain.  It was constant for 2 days center chest with no radiation but some shortness of breath.  He did not sleep for 2 days and had not been eating well.  Ibuprofen seemed to help a little bit but then the pain resolved on its own.  He had been drinking banana and blueberry smoothies.                           Today he states he is not having any chest discomfort.  He does feel sometimes some shortness of breath with activity but this is not severe.  He is here with his brother.  Overall he is quite depressed about not working.  He has been waiting for potential Social Security disability but this process is going on for quite some  time.  His blood pressure is also being monitored and controlled by Dr. [name] of nephrology.  He is on both labetalol and Toprol.  He is concerned about working throughout the day taking clonidine 0.3, 3 times a day.  He worries about falling asleep.  His brother is a Administrator and he had to stop taking 1 of the doses of clonidine when driving at night.  03/05/2019- here for follow-up.  Recently saw his primary provider.  Pressure still quite high.  He was having difficulty getting both the labetalol and the Lasix over the last several weeks.  No fevers chills nausea vomiting syncope bleeding orthopnea PND or other neurologic sequela.  Just feels tired. Lasix did not come in order. Lasix helped out improved his breathing. Did not get labetalol since May  The patient does not have symptoms concerning for COVID-19 infection (fever, chills, cough, or new shortness of breath).    Past Medical History:  Diagnosis Date  . Anxiety   . CKD (chronic kidney disease), stage IV ([place])   . Depression   . Dyslexia   . Heart palpitations   . High cholesterol   . Hypertension   . Hypertensive urgency   . Stroke ([place]) 06/03/2017   "minor; completely recovered today" (06/13/2017)  . Tobacco abuse    Past Surgical History:  Procedure  Laterality Date  . NO PAST SURGERIES       Current Meds  Medication Sig  . acetaminophen (TYLENOL) 500 MG tablet Take 1,000 mg by mouth as needed for mild pain.  Marland Kitchen albuterol (PROVENTIL HFA;VENTOLIN HFA) 108 (90 Base) MCG/ACT inhaler Inhale 2 puffs into the lungs every 4 (four) hours as needed for wheezing or shortness of breath (cough, shortness of breath or wheezing.).  Marland Kitchen amLODipine (NORVASC) 10 MG tablet Take 1 tablet (10 mg total) by mouth daily.  Marland Kitchen aspirin EC 81 MG tablet Take 81 mg by mouth daily.  Marland Kitchen atorvastatin (LIPITOR) 40 MG tablet Take 1 tablet (40 mg total) by mouth daily at 6 PM.  . busPIRone (BUSPAR) 5 MG tablet Take 1 tablet (5 mg total) by mouth 2  (two) times daily.  . cloNIDine (CATAPRES) 0.3 MG tablet Take 1 tablet (0.3 mg total) by mouth 3 (three) times daily.  . furosemide (LASIX) 20 MG tablet Take 1 tablet (20 mg total) by mouth daily as needed for up to 30 days.  Marland Kitchen gemfibrozil (LOPID) 600 MG tablet Take 1 tablet (600 mg total) by mouth 2 (two) times daily before a meal.  . hydrALAZINE (APRESOLINE) 100 MG tablet Take 1 tablet (100 mg total) by mouth 3 (three) times daily.  Marland Kitchen ibuprofen (ADVIL,MOTRIN) 800 MG tablet Take 1 tablet (800 mg total) by mouth every 8 (eight) hours as needed.  . isosorbide mononitrate (IMDUR) 30 MG 24 hr tablet Take 1 tablet (30 mg total) by mouth daily.  Marland Kitchen labetalol (NORMODYNE) 200 MG tablet TAKE 2 TABLETS (400 MG TOTAL) BY MOUTH 3 (THREE) TIMES DAILY.  . metoprolol succinate (TOPROL-XL) 50 MG 24 hr tablet Take 1 tablet (50 mg total) by mouth daily. Take with or immediately following a meal.  . Multiple Vitamin (MULTIVITAMIN WITH MINERALS) TABS tablet Take 1 tablet by mouth daily as needed.  . nicotine (NICODERM CQ - DOSED IN MG/24 HOURS) 14 mg/24hr patch Place 1 patch (14 mg total) onto the skin daily.  . nitroGLYCERIN (NITROSTAT) 0.4 MG SL tablet Place 1 tablet (0.4 mg total) under the tongue every 5 (five) minutes as needed for chest pain.  . potassium chloride SA (K-DUR,KLOR-CON) 20 MEQ tablet TAKE 1 TABLET BY MOUTH DAILY.  . traZODone (DESYREL) 100 MG tablet Take 1 tablet (100 mg total) by mouth at bedtime as needed for sleep.     Allergies:   Lactose intolerance (gi); Other; and Nitroglycerin   Social History   Tobacco Use  . Smoking status: Current Every Day Smoker    Packs/day: 1.00    Years: 30.00    Pack years: 30.00    Types: Cigarettes  . Smokeless tobacco: Never Used  Substance Use Topics  . Alcohol use: No  . Drug use: Yes    Types: Marijuana    Comment: 06/13/2017 "not often"     Family Hx: The patient's family history includes Diabetes type II in his brother; Hypertension in his  brother and mother.  ROS:   Please see the history of present illness.     All other systems reviewed and are negative.   Prior CV studies:   The following studies were reviewed today:  ECHO 2019 - Left ventricle: The cavity size was mildly dilated. Wall   thickness was increased in a pattern of severe LVH. Systolic   function was normal. The estimated ejection fraction was in the   range of 60% to 65%. Doppler parameters are consistent with both  elevated ventricular end-diastolic filling pressure and elevated   left atrial filling pressure. - Aortic valve: There was mild regurgitation. - Mitral valve: There was mild regurgitation. - Left atrium: The atrium was mildly dilated. - Atrial septum: No defect or patent foramen ovale was identified.  Labs/Other Tests and Data Reviewed:    EKG:  An ECG dated 02/20/19 was personally reviewed today and demonstrated:  NSR, T wave changes noted  Recent Labs: 05/18/2018: BUN 32; Creatinine, Ser 3.24; Hemoglobin 13.0; Platelets 263; Potassium 4.0; Sodium 137   Recent Lipid Panel Lab Results  Component Value Date/Time   CHOL 96 (L) 03/01/2018 01:56 PM   TRIG 62 03/01/2018 01:56 PM   HDL 40 03/01/2018 01:56 PM   CHOLHDL 2.4 03/01/2018 01:56 PM   CHOLHDL 4.3 09/27/2016 03:01 AM   LDLCALC 44 03/01/2018 01:56 PM    Wt Readings from Last 3 Encounters:  02/20/19 218 lb (98.9 kg)  12/19/18 211 lb (95.7 kg)  11/21/18 211 lb (95.7 kg)     Objective:    Vital Signs:  BP (!) 197/106   Pulse 66   Temp 97.9 F (36.6 C)   Ht 5\' 11"  (1.803 m)   SpO2 98%   BMI 30.40 kg/m    VITAL SIGNS:  reviewed GEN:  no acute distress EYES:  sclerae anicteric, EOMI - Extraocular Movements Intact RESPIRATORY:  normal respiratory effort, symmetric expansion SKIN:  no rash, lesions or ulcers. MUSCULOSKELETAL:  no obvious deformities. NEURO:  alert and oriented x 3, no obvious focal deficit PSYCH:  normal affect  ASSESSMENT & PLAN:    Difficult to  control hypertension -  Appreciate Dr. Elza Rafter work in the past. I will plug him into out HTN clinic as well.   Shortness of breath -Echocardiogram with normal ejection fraction.  LVH quite severe.  Likely playing a role in his shortness of breath.  Continue with blood pressure control, walking, exercise. Lasix helps.   Tobacco use  - cessation discussed.   COVID-19 Education: The signs and symptoms of COVID-19 were discussed with the patient and how to seek care for testing (follow up with PCP or arrange E-visit).  The importance of social distancing was discussed today.  Time:   Today, I have spent 11 minutes with the patient with telehealth technology discussing the above problems.     Medication Adjustments/Labs and Tests Ordered: Current medicines are reviewed at length with the patient today.  Concerns regarding medicines are outlined above.   Tests Ordered: No orders of the defined types were placed in this encounter.   Medication Changes: No orders of the defined types were placed in this encounter.   Disposition:  Follow up with HTN clinic here  Signed, Candee Furbish, MD  03/05/2019 9:18 AM    [place]

## 2019-03-05 NOTE — Patient Instructions (Signed)
Medication Instructions:  The current medical regimen is effective;  continue present plan and medications.  If you need a refill on your cardiac medications before your next appointment, please call your pharmacy.   You have been referred to the Hypertension Clinic here in our office.  You will be contacted to be scheduled for this appointment.  Follow-Up: At [facility], you and your health needs are our priority.  As part of our continuing mission to provide you with exceptional heart care, we have created designated Provider Care Teams.  These Care Teams include your primary Cardiologist (physician) and Advanced Practice Providers (APPs -  Physician Assistants and Nurse Practitioners) who all work together to provide you with the care you need, when you need it. You will need a follow up appointment in 6 months with Cecilie Kicks, NP and Dr Marlou Porch in 1 year.  Please call our office 2 months in advance to schedule this appointment.  You may see Candee Furbish, MD or one of the following Advanced Practice Providers on your designated Care Team:   Truitt Merle, NP Cecilie Kicks, NP . Kathyrn Drown, NP  Thank you for choosing [facility]!!

## 2019-03-08 NOTE — Telephone Encounter (Signed)
Spoke with patient's mother (on [place]) and scheduled appointment for 6/8 @ 11:00

## 2019-03-11 ENCOUNTER — Telehealth: Payer: Self-pay | Admitting: Pharmacist

## 2019-03-11 VITALS — BP 206/109 | HR 63

## 2019-03-11 MED ORDER — ISOSORBIDE MONONITRATE ER 30 MG PO TB24: 30.0000 mg | ORAL_TABLET | Freq: Every day | ORAL | 11 refills | Status: DC

## 2019-03-11 MED ORDER — AMLODIPINE BESYLATE 10 MG PO TABS: 10.0000 mg | ORAL_TABLET | Freq: Every day | ORAL | 11 refills | Status: DC

## 2019-03-11 MED FILL — ?AMLODIPINE BESYLATE 10 MG: 10 | 30 days supply | Qty: 30 | Fill #0

## 2019-03-11 MED FILL — ISOSORBIDE MN ER 30 MG TAB: 30 | 30 days supply | Qty: 30 | Fill #0

## 2019-03-11 NOTE — Progress Notes (Addendum)
Patient ID: Stanley [name]                 DOB: [birth year]                      MRN: [id number]     HPI: Stanley [name] is a 43 y.o. male referred by Stanley. Marlou [name] to HTN clinic. PMH is significant for hypertensive emergency, LVH, chest pain, CKD stage IV, stroke, HLD, tobacco abuse, and anxiety/depression. BP was most recently elevated at 197/106 during telehealth visit on 6/2, no medication changes were made. Stanley [name] with nephrology has been managing patient's BP. Pt is on both labetalol and metoprolol.  Visit today conducted via telephone due to COVID-19 pandemic - both patient and his mother were on the call. She fills patient's med box at home and helps with medication copays. He has noted some headaches as well as trouble sleeping at night. He is tired during the day and naps frequently though - he feels that this is due to his medications.   Reports adherence to his medications including TID dosing - takes those at breakfast, lunch, and dinner. Upon further review, discovered that pt has run out of his amlodipine and isosorbide. His home BP this AM was 206/109, HR 63. Reports this has been normal as of late. BP 2 hours after taking medications was 198/116, HR 62.  States the pharmacy will not fill his nicotine patches, he is currently smoking a little less than 1 PPD. He had ibuprofen on his med list but does not report using this and takes Tylenol as needed. He has been using his prn furosemide daily. Exercise is limited due to fatigue. His mom cooks most food - does not add salt to food, only buys food with < 10% RDV of sodium, minimal caffeine.  Current HTN meds: amlodipine 10mg  daily, clonidine 0.3mg  TID, furosemide 20mg  prn - taking daily in AM, hydralazine 100mg  TID, Imdur 30mg  daily, labetalol 400mg  TID, Toprol 50mg  daily  BP goal: <130/56mmHg  Family History: The patient's family history includes Diabetes type II in his brother; Hypertension in his brother and mother.  Social History:  Currently smokes 1 PPD, denies alcohol use, occasionally uses marijuana.  Diet: Breakfast - [place] sausage, eggs, oranges. Lunch - sandwich. Eats a lot of vegetables. Dinner - meat, starch, veggie. Only eats beef 2x a month, mostly eats chicken, veal, or [place]. Does not add any salt to food, mom buys foods with < 10% sodium. Caffeine is limited to 1 cup of coffee each day, occasionally sweet tea.  Exercise: Limited - medication makes him very tired/weak.  Wt Readings from Last 3 Encounters:  02/20/19 218 lb (98.9 kg)  12/19/18 211 lb (95.7 kg)  11/21/18 211 lb (95.7 kg)   BP Readings from Last 3 Encounters:  03/05/19 (!) 197/106  02/20/19 (!) 214/108  12/19/18 (!) 166/97   Pulse Readings from Last 3 Encounters:  03/05/19 66  02/20/19 62  12/19/18 68    Renal function: CrCl cannot be calculated (Patient's most recent lab result is older than the maximum 21 days allowed.).  Past Medical History:  Diagnosis Date  . Anxiety   . CKD (chronic kidney disease), stage IV ([place])   . Depression   . Dyslexia   . Heart palpitations   . High cholesterol   . Hypertension   . Hypertensive urgency   . Stroke ([place]) 06/03/2017   "minor; completely recovered today" (06/13/2017)  . Tobacco abuse  Current Outpatient Medications on File Prior to Visit  Medication Sig Dispense Refill  . acetaminophen (TYLENOL) 500 MG tablet Take 1,000 mg by mouth as needed for mild pain.    Marland Kitchen albuterol (PROVENTIL HFA;VENTOLIN HFA) 108 (90 Base) MCG/ACT inhaler Inhale 2 puffs into the lungs every 4 (four) hours as needed for wheezing or shortness of breath (cough, shortness of breath or wheezing.). 1 Inhaler 1  . amLODipine (NORVASC) 10 MG tablet Take 1 tablet (10 mg total) by mouth daily. 90 tablet 3  . aspirin EC 81 MG tablet Take 81 mg by mouth daily.    Marland Kitchen atorvastatin (LIPITOR) 40 MG tablet Take 1 tablet (40 mg total) by mouth daily at 6 PM. 90 tablet 3  . busPIRone (BUSPAR) 5 MG tablet Take 1 tablet (5  mg total) by mouth 2 (two) times daily. 60 tablet 1  . cloNIDine (CATAPRES) 0.3 MG tablet Take 1 tablet (0.3 mg total) by mouth 3 (three) times daily. 270 tablet 3  . furosemide (LASIX) 20 MG tablet Take 1 tablet (20 mg total) by mouth daily as needed for up to 30 days. 30 tablet 2  . gemfibrozil (LOPID) 600 MG tablet Take 1 tablet (600 mg total) by mouth 2 (two) times daily before a meal. 180 tablet 3  . hydrALAZINE (APRESOLINE) 100 MG tablet Take 1 tablet (100 mg total) by mouth 3 (three) times daily. 270 tablet 3  . ibuprofen (ADVIL,MOTRIN) 800 MG tablet Take 1 tablet (800 mg total) by mouth every 8 (eight) hours as needed. 30 tablet 2  . isosorbide mononitrate (IMDUR) 30 MG 24 hr tablet Take 1 tablet (30 mg total) by mouth daily. 90 tablet 3  . labetalol (NORMODYNE) 200 MG tablet TAKE 2 TABLETS (400 MG TOTAL) BY MOUTH 3 (THREE) TIMES DAILY. 540 tablet 1  . metoprolol succinate (TOPROL-XL) 50 MG 24 hr tablet Take 1 tablet (50 mg total) by mouth daily. Take with or immediately following a meal. 90 tablet 3  . Multiple Vitamin (MULTIVITAMIN WITH MINERALS) TABS tablet Take 1 tablet by mouth daily as needed.    . nicotine (NICODERM CQ - DOSED IN MG/24 HOURS) 14 mg/24hr patch Place 1 patch (14 mg total) onto the skin daily. 28 patch 0  . nitroGLYCERIN (NITROSTAT) 0.4 MG SL tablet Place 1 tablet (0.4 mg total) under the tongue every 5 (five) minutes as needed for chest pain. 50 tablet 3  . potassium chloride SA (K-DUR,KLOR-CON) 20 MEQ tablet TAKE 1 TABLET BY MOUTH DAILY. 30 tablet 3  . traZODone (DESYREL) 100 MG tablet Take 1 tablet (100 mg total) by mouth at bedtime as needed for sleep. 30 tablet 3   No current facility-administered medications on file prior to visit.     Allergies  Allergen Reactions  . Lactose Intolerance (Gi) Nausea And Vomiting  . Other Swelling    All beans  . Nitroglycerin Anxiety and Other (See Comments)    Pain and feels like he is on fire     Assessment/Plan:  1.  Hypertension - BP remains suboptimally controlled and far above goal <130/42mmHg. As it was discovered that pt ran out of amlodipine and Imdur, I have sent in refills and pt will resume amlodipine 10mg  daily and Imdur 30mg  daily. He will continue on clonidine 0.3mg  TID, labetalol 400mg  TID, Toprol 50mg  daily, and furosemide 20mg  daily. HR > 60 even with use of 2 beta blockers. Discussed that clonidine is likely contributing to his fatigue, however BP options are limited  due to his CKD stage IV (cannot use ACE/ARB, thiazide diuretics, or aldosterone antagonists) and we cannot de-escalate clonidine therapy with his current BP readings. Will call pt in 2 weeks for update with BP readings. If they remain above goal, will plan to add doxazosin 2mg  HS.  2.Tobacco abuse - Pt smoking a little less than 1 PPD. Discussed the need to quit. He has nicotine patches on his med list but states the pharmacy will not fill them. [facility] to see what patch they will cover. Attempted 3x however their phone rang busy each time and was unable to get through. Will follow up at next visit.  Megan E. Supple, PharmD, BCACP, [place] [address], [place], [place] [address] Phone: [phone]; Fax: [phone] 03/11/2019 1:32 PM

## 2019-03-13 ENCOUNTER — Telehealth: Payer: Self-pay | Admitting: Cardiology

## 2019-03-13 MED FILL — ?METOPROLOL SUCC ER 50MG TA: 50 | 30 days supply | Qty: 30 | Fill #8

## 2019-03-13 NOTE — Telephone Encounter (Signed)
I will route this message back to the operator as I need more information as to why the pt is calling.

## 2019-03-13 NOTE — Telephone Encounter (Signed)
Error

## 2019-03-13 NOTE — Telephone Encounter (Signed)
New Message °

## 2019-03-19 ENCOUNTER — Telehealth: Payer: Self-pay | Admitting: Cardiology

## 2019-03-19 MED FILL — cloNIDine HCL 0.3 MG TABS: 0.3 | 30 days supply | Qty: 90 | Fill #8

## 2019-03-19 MED FILL — ?FUROSEMIDE 20 MG TABLET: 20 | 30 days supply | Qty: 30 | Fill #1

## 2019-03-19 MED FILL — LABETALOL HCL 200 MG TABLET: 200 | 30 days supply | Qty: 180 | Fill #2

## 2019-03-19 MED FILL — ?ATORVASTATIN 40MG TABLET: 40 | 30 days supply | Qty: 30 | Fill #6

## 2019-03-19 MED FILL — hydrALAZINE HCL 100 MG TABS: 100 | 30 days supply | Qty: 90 | Fill #8

## 2019-03-19 NOTE — Telephone Encounter (Signed)
  [facility] is calling about two drug interactions and the patient having a kidney injury and they need advice on what to do.

## 2019-03-20 NOTE — Telephone Encounter (Addendum)
Returned call to outpatient pharmacy, concern is for concomitant atorvastatin and gemfibrozil use. Combination therapy can increase risk of myalgias and gemfibrozil use is not recommended if SCr > 2. Patient's SCr is > 3. His TG are also normal in the 60s. It looks as though he was started on gemfibrozil before establishing with [facility]. Will plan to discontinue gemfibrozil and continue atorvastatin as TG will still remain in the normal range even after gemfibrozil discontinuation.   Called pt who is aware of plan. He did mention that he has stopped taking his isosorbide because it gave him a headache.  Will route to Dr Marlou Porch as an Juluis Rainier regarding gemfibrozil and for follow up/advice regarding isosorbide.

## 2019-03-21 NOTE — Telephone Encounter (Signed)
Thanks, agree with plan to stop gemfibrozil with atorvastatin use.  I am also fine with him holding off on using isosorbide because of headaches. Candee Furbish, MD

## 2019-03-25 ENCOUNTER — Telehealth (INDEPENDENT_AMBULATORY_CARE_PROVIDER_SITE_OTHER): Payer: Self-pay | Admitting: Pharmacist

## 2019-03-25 VITALS — BP 191/107 | HR 64

## 2019-03-25 DIAGNOSIS — F17210 Nicotine dependence, cigarettes, uncomplicated: Secondary | ICD-10-CM

## 2019-03-25 DIAGNOSIS — I10 Essential (primary) hypertension: Secondary | ICD-10-CM

## 2019-03-25 MED ORDER — NICOTINE 21 MG/24HR TD PT24: 21.0000 mg | MEDICATED_PATCH | Freq: Every day | TRANSDERMAL | 1 refills | Status: DC

## 2019-03-25 MED ORDER — NICOTINE POLACRILEX 2 MG MT GUM: 2.0000 mg | CHEWING_GUM | OROMUCOSAL | 0 refills | Status: DC | PRN

## 2019-03-25 MED ORDER — DOXAZOSIN MESYLATE 2 MG PO TABS: 2.0000 mg | ORAL_TABLET | Freq: Every day | ORAL | 3 refills | Status: DC

## 2019-03-25 MED FILL — NICOTINE 21 MG/24HR PATCH: 21 | 28 days supply | Qty: 28 | Fill #0

## 2019-03-25 MED FILL — DOXAZOSIN MESYLATE 2 MG TAB: 2 | 30 days supply | Qty: 30 | Fill #0

## 2019-03-25 MED FILL — traZODone HCL 100 MG TABS: 100 | 30 days supply | Qty: 30 | Fill #3

## 2019-03-25 NOTE — Progress Notes (Signed)
Patient ID: Stanley [name]                 DOB: 1976/08/01                      MRN: [id number]     HPI: Stanley [name] is a 43 y.o. male referred by Dr. Marlou Porch to HTN clinic. PMH is significant for hypertensive emergency, LVH, chest pain, CKD stage IV, stroke, HLD, tobacco abuse, and anxiety/depression. BP was most recently elevated at 206/109 during telehealth visit on 6/08 with HTN clinic. Dr [name] with nephrology has been managing patient's BP. At last visit with HTN clinic, it was discovered that patient had run out of amlodipine and isosorbide. He was also on both labetalol and metoprolol. However these were both continued due to limited medication options and no signs of bradycardia. Amlodipine was resumed, but isosorbide was put on hold due to headaches.  Visit today conducted via telephone due to COVID-19 pandemic - both patient and his mother were on the call. She fills patient's med box at home and helps with medication copays. He has noted shortness of breath on exertion (specifically during intercourse) and dizziness and SOB even walking around. This limits his ability to exercise or be active.  States that his swelling is much improved on furosemide. Does not have a scale at home. I have offered him a scale from [facility]. Accepted and will pick up from office. I have advised him to check weight daily and to call with a weight gain of >3lb in one day or 5lb in a week.  Patient has not been checking blood pressure. Last reading was from last appointment with [place]. Patient took BP while on the phone. Initial reading was 221/117 HR 71. After about 20 min patient checked again and came down yo 191/107 HR 64. I advised him to take his second dose of clonidine when we got off the phone.  Mother was asking about when to take his meds and if they needed to be taken with food and what ones were ok to take together. Patient taking TID meds with breakfast (8AM), lunch (12-2) and dinner  (6-9).  Current HTN meds: amlodipine 10mg  daily, clonidine 0.3mg  TID, furosemide 20mg  prn - taking daily in AM, hydralazine 100mg  TID, labetalol 400mg  TID, Toprol 50mg  daily  BP goal: <130/50mmHg  Family History: The patient's family history includes Diabetes type II in his brother; Hypertension in his brother and mother.  Social History: Currently smokes 1 PPD, denies alcohol use, occasionally uses marijuana.  Diet: Breakfast - [place] sausage, eggs, oranges. Lunch - sandwich. Eats a lot of vegetables. Dinner - meat, starch, veggie. Only eats beef 2x a month, mostly eats chicken, veal, or [place]. Does not add any salt to food, mom buys foods with < 10% sodium. Caffeine is limited to 1 cup of coffee each day, occasionally sweet tea.  Exercise: Limited - medication makes him very tired/weak.  Wt Readings from Last 3 Encounters:  02/20/19 218 lb (98.9 kg)  12/19/18 211 lb (95.7 kg)  11/21/18 211 lb (95.7 kg)   BP Readings from Last 3 Encounters:  03/11/19 (!) 206/109  03/05/19 (!) 197/106  02/20/19 (!) 214/108   Pulse Readings from Last 3 Encounters:  03/11/19 63  03/05/19 66  02/20/19 62    Renal function: CrCl cannot be calculated (Patient's most recent lab result is older than the maximum 21 days allowed.).  Past Medical  History:  Diagnosis Date   Anxiety    CKD (chronic kidney disease), stage IV (HCC)    Depression    Dyslexia    Heart palpitations    High cholesterol    Hypertension    Hypertensive urgency    Stroke ([place]) 06/03/2017   "minor; completely recovered today" (06/13/2017)   Tobacco abuse     Current Outpatient Medications on File Prior to Visit  Medication Sig Dispense Refill   acetaminophen (TYLENOL) 500 MG tablet Take 1,000 mg by mouth as needed for mild pain.     albuterol (PROVENTIL HFA;VENTOLIN HFA) 108 (90 Base) MCG/ACT inhaler Inhale 2 puffs into the lungs every 4 (four) hours as needed for wheezing or shortness of breath (cough,  shortness of breath or wheezing.). 1 Inhaler 1   amLODipine (NORVASC) 10 MG tablet Take 1 tablet (10 mg total) by mouth daily. 30 tablet 11   aspirin EC 81 MG tablet Take 81 mg by mouth daily.     atorvastatin (LIPITOR) 40 MG tablet Take 1 tablet (40 mg total) by mouth daily at 6 PM. 90 tablet 3   busPIRone (BUSPAR) 5 MG tablet Take 1 tablet (5 mg total) by mouth 2 (two) times daily. 60 tablet 1   cloNIDine (CATAPRES) 0.3 MG tablet Take 1 tablet (0.3 mg total) by mouth 3 (three) times daily. 270 tablet 3   furosemide (LASIX) 20 MG tablet Take 1 tablet (20 mg total) by mouth daily as needed for up to 30 days. 30 tablet 2   hydrALAZINE (APRESOLINE) 100 MG tablet Take 1 tablet (100 mg total) by mouth 3 (three) times daily. 270 tablet 3   isosorbide mononitrate (IMDUR) 30 MG 24 hr tablet Take 1 tablet (30 mg total) by mouth daily. 30 tablet 11   labetalol (NORMODYNE) 200 MG tablet TAKE 2 TABLETS (400 MG TOTAL) BY MOUTH 3 (THREE) TIMES DAILY. 540 tablet 1   metoprolol succinate (TOPROL-XL) 50 MG 24 hr tablet Take 1 tablet (50 mg total) by mouth daily. Take with or immediately following a meal. 90 tablet 3   Multiple Vitamin (MULTIVITAMIN WITH MINERALS) TABS tablet Take 1 tablet by mouth daily as needed.     nicotine (NICODERM CQ - DOSED IN MG/24 HOURS) 14 mg/24hr patch Place 1 patch (14 mg total) onto the skin daily. 28 patch 0   nitroGLYCERIN (NITROSTAT) 0.4 MG SL tablet Place 1 tablet (0.4 mg total) under the tongue every 5 (five) minutes as needed for chest pain. 50 tablet 3   Potassium Gluconate 595 MG CAPS Take 1 capsule by mouth daily.     PSYLLIUM PO Take 1 tablet by mouth daily.     traZODone (DESYREL) 100 MG tablet Take 1 tablet (100 mg total) by mouth at bedtime as needed for sleep. 30 tablet 3   No current facility-administered medications on file prior to visit.     Allergies  Allergen Reactions   Lactose Intolerance (Gi) Nausea And Vomiting   Other Swelling    All  beans   Nitroglycerin Anxiety and Other (See Comments)    Pain and feels like he is on fire     Assessment/Plan:  1. Hypertension - BP remains suboptimally controlled and far above goal <130/65mmHg. Patient denies missing any doses last night or the AM. Discussed that BP options are limited due to his CKD stage IV (cannot use ACE/ARB, thiazide diuretics, or aldosterone antagonists) Will add doxazosin 2mg  HS. Discussed side effects of orthostatic hypotension and reasoning behind  taking it at bedtime. Ok to take with trazodone. Asked patient to check his blood pressure daily (or at least every other day) until his next appointment in 2 weeks. Discussed with his mother better spacing of blood pressure medications. Suggested he take his TID medications with breakfast (8 AM), a late lunch or snack(2PM) and at bedtime (10-11PM) as to have less time without medications overnight. (was taking last dose at night around 6PM and the not again until 8AM). Follow up virtual/phone visit in 2 weeks.  2.Tobacco abuse - At last visit patient states [facility] wouldn't fill patches. Looks like Rx is >46 years old. I called [facility] and they said they had the 21,14, and 7mg  patches. I will send over another Rx for 21mg  patches. States he smokes about 1/2 pack per day, sometimes more. Patient states he is ready to quite. Reviewed directions for patch, steps, to rotate site and to remove prior to bedtime if he has a hard time sleeping. Will also provide Rx for nicotine gum. Instructions on how to chew and park gum were also provided.  Ramond Dial, Pharm.D, [facility]  [address], [place], [place] [address]  Phone: [phone]; Fax: [phone]   03/25/2019 10:59 AM

## 2019-03-26 NOTE — Telephone Encounter (Signed)
Pt is aware he can stop isosorbide, med has been removed from his list and added to allergies.

## 2019-04-08 ENCOUNTER — Telehealth (INDEPENDENT_AMBULATORY_CARE_PROVIDER_SITE_OTHER): Payer: Self-pay | Admitting: Pharmacist

## 2019-04-08 ENCOUNTER — Other Ambulatory Visit: Payer: Self-pay

## 2019-04-08 VITALS — BP 201/108 | HR 66

## 2019-04-08 DIAGNOSIS — I10 Essential (primary) hypertension: Secondary | ICD-10-CM

## 2019-04-08 MED ORDER — DOXAZOSIN MESYLATE 4 MG PO TABS: 4.0000 mg | ORAL_TABLET | Freq: Every day | ORAL | 11 refills | Status: DC

## 2019-04-08 MED FILL — ?AMLODIPINE BESYLATE 10 MG: 10 | 30 days supply | Qty: 30 | Fill #1

## 2019-04-08 NOTE — Progress Notes (Signed)
Patient ID: ODA LANSDOWNE                 DOB: January 18, 1976                      MRN: [id number]     HPI: Stanley [name] is a 43 y.o. male referred by Dr. Marlou Porch to HTN clinic. PMH is significant for hypertensive emergency, LVH, chest pain, CKD stage IV, stroke, HLD, tobacco abuse, and anxiety/depression. BP was most recently elevated at 191/107 during telehealth visit on 6/22 with HTN clinic. Dr [name] with nephrology has previously been managing patient's BP. At last visit with HTN clinic, doxazosin 2mg  at bedtime was initiated. Patient and mother were also asked to change the timing of his three time a day medications to 8AM, 2PM and 10-11PM for better spacing. Patient was provided with a scale to monitor weight. He was asked to weigh himself every morning. He was also asked to check him blood pressure once a day. Of note, patient is on two beta blockers. Due to the limited amount of medication options for patient due to CKD (cannot use ACE/ARB or aldosterone antagonists) and the lack of evidence that there is any bradycardia, it has been decided to leave as is.  Visit today conducted via telephone due to COVID-19 pandemic - both patient and his mother were on the call. She fills patient's med box at home and helps with medication copays. Patient has checked his blood pressure at home a few times since last visit. Still running very high.  Patient denies dizziness/lightheadedness. States he hasn't had headaches since stopping Imdur and starting doxazosin. No blurred vision. States that he had SOB, chest pain last night. Felt like he couldn't breath. Says he got in the shower and the steam helped him feel better. Does admit to a cough but states he has had for a long time. Of note he states that his O2 stats are usually in the 70's?? (possible a machine error) Will confirm when patient is in the office) Denies swelling but state that he has dry skin/scabs on his ankles.   Does not take NSAIDS. States he has  been compliant with medications. Admits to low Na diet. Has been spacing medications as discussed at previous visit.  Home BP readings: 6/23- 196/111  6/28- 181/98 HR 70 o2 sat 70 2:45PM 6/30 - 188/106 HR 64 10:45AM 7/4 201/108 HR 66   Current HTN meds: amlodipine 10mg  daily, clonidine 0.3mg  TID, furosemide 20mg  prn - taking daily in AM, hydralazine 100mg  TID, labetalol 400mg  TID, Toprol 50mg  daily, doxazosin 2mg  at bedtime  Previously tried: isosorbide (headaches)  BP goal: <130/87mmHg  Family History: The patient's family history includes Diabetes type II in his brother; Hypertension in his brother and mother.  Social History: Currently smokes 1 PPD, denies alcohol use, occasionally uses marijuana.  Diet: Breakfast - [place] sausage, eggs, oranges. Lunch - sandwich. Eats a lot of vegetables. Dinner - meat, starch, veggie. Only eats beef 2x a month, mostly eats chicken, veal, or [place]. Does not add any salt to food, mom buys foods with < 10% sodium. Caffeine is limited to 1 cup of coffee each day, occasionally sweet tea.  Exercise: Limited - medication makes him very tired/weak.  Wt Readings from Last 3 Encounters:  02/20/19 218 lb (98.9 kg)  12/19/18 211 lb (95.7 kg)  11/21/18 211 lb (95.7 kg)   BP Readings from Last 3 Encounters:  03/25/19 (!) 191/107  03/11/19 (!) 206/109  03/05/19 (!) 197/106   Pulse Readings from Last 3 Encounters:  03/25/19 64  03/11/19 63  03/05/19 66    Renal function: CrCl cannot be calculated (Patient's most recent lab result is older than the maximum 21 days allowed.).  Past Medical History:  Diagnosis Date  . Anxiety   . CKD (chronic kidney disease), stage IV ([facility])   . Depression   . Dyslexia   . Heart palpitations   . High cholesterol   . Hypertension   . Hypertensive urgency   . Stroke ([facility]) 06/03/2017   "minor; completely recovered today" (06/13/2017)  . Tobacco abuse     Current Outpatient Medications on File Prior to Visit   Medication Sig Dispense Refill  . acetaminophen (TYLENOL) 500 MG tablet Take 1,000 mg by mouth as needed for mild pain.    Marland Kitchen albuterol (PROVENTIL HFA;VENTOLIN HFA) 108 (90 Base) MCG/ACT inhaler Inhale 2 puffs into the lungs every 4 (four) hours as needed for wheezing or shortness of breath (cough, shortness of breath or wheezing.). 1 Inhaler 1  . amLODipine (NORVASC) 10 MG tablet Take 1 tablet (10 mg total) by mouth daily. 30 tablet 11  . aspirin EC 81 MG tablet Take 81 mg by mouth daily.    Marland Kitchen atorvastatin (LIPITOR) 40 MG tablet Take 1 tablet (40 mg total) by mouth daily at 6 PM. 90 tablet 3  . busPIRone (BUSPAR) 5 MG tablet Take 1 tablet (5 mg total) by mouth 2 (two) times daily. 60 tablet 1  . cloNIDine (CATAPRES) 0.3 MG tablet Take 1 tablet (0.3 mg total) by mouth 3 (three) times daily. 270 tablet 3  . doxazosin (CARDURA) 2 MG tablet Take 1 tablet (2 mg total) by mouth at bedtime. 90 tablet 3  . furosemide (LASIX) 20 MG tablet Take 1 tablet (20 mg total) by mouth daily as needed for up to 30 days. 30 tablet 2  . hydrALAZINE (APRESOLINE) 100 MG tablet Take 1 tablet (100 mg total) by mouth 3 (three) times daily. 270 tablet 3  . labetalol (NORMODYNE) 200 MG tablet TAKE 2 TABLETS (400 MG TOTAL) BY MOUTH 3 (THREE) TIMES DAILY. 540 tablet 1  . metoprolol succinate (TOPROL-XL) 50 MG 24 hr tablet Take 1 tablet (50 mg total) by mouth daily. Take with or immediately following a meal. 90 tablet 3  . Multiple Vitamin (MULTIVITAMIN WITH MINERALS) TABS tablet Take 1 tablet by mouth daily as needed.    . nicotine (NICODERM CQ - DOSED IN MG/24 HOURS) 21 mg/24hr patch Place 1 patch (21 mg total) onto the skin daily. Place 1 patch onto the skin daily for 6 weeks. Call [phone] for next prescription 28 patch 1  . nicotine polacrilex (NICORETTE) 2 MG gum Take 1 each (2 mg total) by mouth as needed for smoking cessation. 100 tablet 0  . nitroGLYCERIN (NITROSTAT) 0.4 MG SL tablet Place 1 tablet (0.4 mg total)  under the tongue every 5 (five) minutes as needed for chest pain. 50 tablet 3  . Potassium Gluconate 595 MG CAPS Take 1 capsule by mouth daily.    . PSYLLIUM PO Take 1 tablet by mouth daily.    . traZODone (DESYREL) 100 MG tablet Take 1 tablet (100 mg total) by mouth at bedtime as needed for sleep. 30 tablet 3   No current facility-administered medications on file prior to visit.     Allergies  Allergen Reactions  . Lactose Intolerance (Gi) Nausea And Vomiting  . Other Swelling  All beans  . Isosorbide     headaches  . Nitroglycerin Anxiety and Other (See Comments)    Pain and feels like he is on fire     Assessment/Plan:  1. Hypertension - BP remains suboptimally controlled and far above goal <130/21mmHg. Patient denies missing any doses in the last 2 weeks. Will increase doxazosin to 4mg  HS. Patient forgot to pick up the scale we left for him. Says his mom ordered him one, but it hasn't come yet. Discussed again the importance of a low Na diet. I encouraged patient to track his Na intake for 1-2 days to see if he is eating 1500mg  of Na a day. Reviewed how to read nutrition label and calculate intake with his mother. Goal would be to consume 1500mg  of Na a day. I have asked patient to check his blood pressure daily until his next appointment in 2 weeks. Will follow up with patient in about 2 weeks in the office. I have asked him to bring his home BP meter with him.  I will reach out to patient nephrologist to get his opinion on using an ACE/ARB in this patient with CKD. Will also need to get updated scr.   2.Tobacco abuse - Patient has not yet quite. He says he has 1 pack left and is going to quite when they are gone. States his mother is going to quite with him. Says he was able to get patches from pharmacy. Patient commended on deciding to quite and commended his mom on making the decision to quite with him.   Ramond Dial, Pharm.D, [place]   [address], [place], [place] [address]  Phone: [phone]; Fax: [phone]   04/08/2019 6:59 AM

## 2019-04-10 ENCOUNTER — Telehealth: Payer: Self-pay | Admitting: Cardiology

## 2019-04-10 NOTE — Telephone Encounter (Signed)
Left message w/ Hinton Dyer at [facility] to return call to clarify what records they need from our office.

## 2019-04-10 NOTE — Telephone Encounter (Signed)
  Mother was asked by Dr Marlou Porch nurse when Bayard Hugger had seen Dr [name] last. Mother states is has been a while and Dr Elissa Hefty office said we can fax records over to them at [facility] at [phone]. Dana's direct number is [phone] ext [phone] if there are any questions.

## 2019-04-11 ENCOUNTER — Encounter: Payer: Self-pay | Admitting: Family Medicine

## 2019-04-11 ENCOUNTER — Other Ambulatory Visit: Payer: Self-pay

## 2019-04-11 ENCOUNTER — Ambulatory Visit (INDEPENDENT_AMBULATORY_CARE_PROVIDER_SITE_OTHER): Payer: Self-pay | Admitting: Family Medicine

## 2019-04-11 VITALS — BP 178/100 | HR 72 | Temp 98.9°F | Resp 16 | Ht 71.0 in | Wt 210.0 lb

## 2019-04-11 DIAGNOSIS — I10 Essential (primary) hypertension: Secondary | ICD-10-CM

## 2019-04-11 DIAGNOSIS — L03119 Cellulitis of unspecified part of limb: Secondary | ICD-10-CM

## 2019-04-11 MED ORDER — SULFAMETHOXAZOLE-TRIMETHOPRIM 800-160 MG PO TABS: 1.0000 | ORAL_TABLET | Freq: Two times a day (BID) | ORAL | 0 refills | Status: DC

## 2019-04-11 MED ORDER — CEFTRIAXONE SODIUM 1 G IJ SOLR
1.0000 g | Freq: Once | INTRAMUSCULAR | Status: AC
  Administered 2019-04-11: 1 g via INTRAMUSCULAR

## 2019-04-11 MED FILL — SULFAMETHOXAZOLE-TMP DS TAB: 800-160 | 10 days supply | Qty: 20 | Fill #0

## 2019-04-11 NOTE — Progress Notes (Signed)
Patient [facility]   Progress Note: Sick Visit Provider: Lanae Boast, FNP  SUBJECTIVE:   Stanley [name] is a 43 y.o. male who  has a past medical history of Anxiety, CKD (chronic kidney disease), stage IV ([place]), Depression, Dyslexia, Heart palpitations, High cholesterol, Hypertension, Hypertensive urgency, Stroke ([place] [facility]) (06/03/2017), and Tobacco abuse.. Patient presents today for Insect Bite (one on left foot and one on right ankle. )  Rash This is a new problem. The current episode started in the past 7 days. The problem has been rapidly worsening since onset. The rash is characterized by swelling, redness, pain and draining. He was exposed to an insect bite/sting. Pertinent negatives include no facial edema, fever, rhinorrhea, shortness of breath or sore throat. Past treatments include antibiotic cream. The treatment provided mild relief.  Patient did not see the insect. No one else in the household affected.   Review of Systems  Constitutional: Negative for chills and fever.  HENT: Negative for rhinorrhea and sore throat.   Respiratory: Negative for shortness of breath.   Skin: Positive for rash.  All other systems reviewed and are negative.    OBJECTIVE: BP (!) 197/103 (BP Location: Left Arm, Patient Position: Sitting, Cuff Size: Large)   Pulse 72   Temp 98.9 F (37.2 C) (Oral)   Resp 16   Ht 5\' 11"  (1.803 m)   Wt 210 lb (95.3 kg)   SpO2 100%   BMI 29.29 kg/m   Wt Readings from Last 3 Encounters:  04/11/19 210 lb (95.3 kg)  02/20/19 218 lb (98.9 kg)  12/19/18 211 lb (95.7 kg)     Physical Exam Vitals signs and nursing note reviewed.  Constitutional:      General: He is not in acute distress.    Appearance: Normal appearance.  HENT:     Head: Normocephalic and atraumatic.  Eyes:     Extraocular Movements: Extraocular movements intact.     Conjunctiva/sclera: Conjunctivae normal.     Pupils: Pupils are equal, round, and  reactive to light.  Cardiovascular:     Rate and Rhythm: Normal rate and regular rhythm.     Heart sounds: No murmur.  Pulmonary:     Effort: Pulmonary effort is normal.     Breath sounds: Normal breath sounds.  Musculoskeletal: Normal range of motion.  Skin:    General: Skin is warm and dry.     Findings: Abscess, erythema and lesion present.  Neurological:     Mental Status: He is alert and oriented to person, place, and time.  Psychiatric:        Mood and Affect: Mood normal.        Behavior: Behavior normal.        Thought Content: Thought content normal.        Judgment: Judgment normal.             ASSESSMENT/PLAN:   1. Cellulitis of lower extremity, unspecified laterality Rocephin 1 gram given in the office today. Start on Bactrim DS BID x 10 days. Continue to keep area clean and dry. Cover with otc antibiotic ointment and bandage. Advised patient to go to the ED if redness goes outside of the marked line.  - cefTRIAXone (ROCEPHIN) injection 1 g - sulfamethoxazole-trimethoprim (BACTRIM DS) 800-160 MG tablet; Take 1 tablet by mouth 2 (two) times daily for 10 days.  Dispense: 20 tablet; Refill: 0    2. Uncontrolled hypertension Patient reports taking his BP medication 30  minutes prior to coming to the clinic. He will follow up with his PCP for this.    The patient was given clear instructions to go to ER or return to medical center if symptoms do not improve, worsen or new problems develop. The patient verbalized understanding and agreed with plan of care.   Ms. Doug Sou. Nathaneil Canary, FNP-BC [facility] [address] [place], [place] [address] [phone]     This note has been created with Dragon speech recognition software and smart phrase technology. Any transcriptional errors are unintentional.

## 2019-04-11 NOTE — Patient Instructions (Signed)
If the redness increases, please go immediately to the emergency room.  Take all antibiotics until they are finished.     Cellulitis, Adult  Cellulitis is a skin infection. The infected area is often warm, red, swollen, and sore. It occurs most often in the arms and lower legs. It is very important to get treated for this condition. What are the causes? This condition is caused by bacteria. The bacteria enter through a break in the skin, such as a cut, burn, insect bite, open sore, or crack. What increases the risk? This condition is more likely to occur in people who:  Have a weak body defense system (immune system).  Have open cuts, burns, bites, or scrapes on the skin.  Are older than 42 years of age.  Have a blood sugar problem (diabetes).  Have a long-lasting (chronic) liver disease (cirrhosis) or kidney disease.  Are very overweight (obese).  Have a skin problem, such as: ? Itchy rash (eczema). ? Slow movement of blood in the veins (venous stasis). ? Fluid buildup below the skin (edema).  Have been treated with high-energy rays (radiation).  Use IV drugs. What are the signs or symptoms? Symptoms of this condition include:  Skin that is: ? Red. ? Streaking. ? Spotting. ? Swollen. ? Sore or painful when you touch it. ? Warm.  A fever.  Chills.  Blisters. How is this diagnosed? This condition is diagnosed based on:  Medical history.  Physical exam.  Blood tests.  Imaging tests. How is this treated? Treatment for this condition may include:  Medicines to treat infections or allergies.  Home care, such as: ? Rest. ? Placing cold or warm cloths (compresses) on the skin.  Hospital care, if the condition is very bad. Follow these instructions at home: Medicines  Take over-the-counter and prescription medicines only as told by your doctor.  If you were prescribed an antibiotic medicine, take it as told by your doctor. Do not stop taking it even  if you start to feel better. General instructions   Drink enough fluid to keep your pee (urine) pale yellow.  Do not touch or rub the infected area.  Raise (elevate) the infected area above the level of your heart while you are sitting or lying down.  Place cold or warm cloths on the area as told by your doctor.  Keep all follow-up visits as told by your doctor. This is important. Contact a doctor if:  You have a fever.  You do not start to get better after 1-2 days of treatment.  Your bone or joint under the infected area starts to hurt after the skin has healed.  Your infection comes back. This can happen in the same area or another area.  You have a swollen bump in the area.  You have new symptoms.  You feel ill and have muscle aches and pains. Get help right away if:  Your symptoms get worse.  You feel very sleepy.  You throw up (vomit) or have watery poop (diarrhea) for a long time.  You see red streaks coming from the area.  Your red area gets larger.  Your red area turns dark in color. These symptoms may represent a serious problem that is an emergency. Do not wait to see if the symptoms will go away. Get medical help right away. Call your local emergency services (911 in the [place] Do not drive yourself to the hospital. Summary  Cellulitis is a skin infection. The area is often  warm, red, swollen, and sore.  This condition is treated with medicines, rest, and cold and warm cloths.  Take all medicines only as told by your doctor.  Tell your doctor if symptoms do not start to get better after 1-2 days of treatment. This information is not intended to replace advice given to you by your health care provider. Make sure you discuss any questions you have with your health care provider. Document Released: 03/07/2008 Document Revised: 02/08/2018 Document Reviewed: 02/08/2018 Elsevier Patient Education  2020 [employer].

## 2019-04-15 IMAGING — DX DG CHEST 2V
2 series · 2 of 2 positions shown · non-contrast
Comparison: 11/15/2016

CLINICAL DATA: Witnessed syncopal episode at home, palpitations,
history hypertension, stroke, stage IV chronic kidney disease,
smoker

EXAM:
CHEST - 2 VIEW

[chest ap]
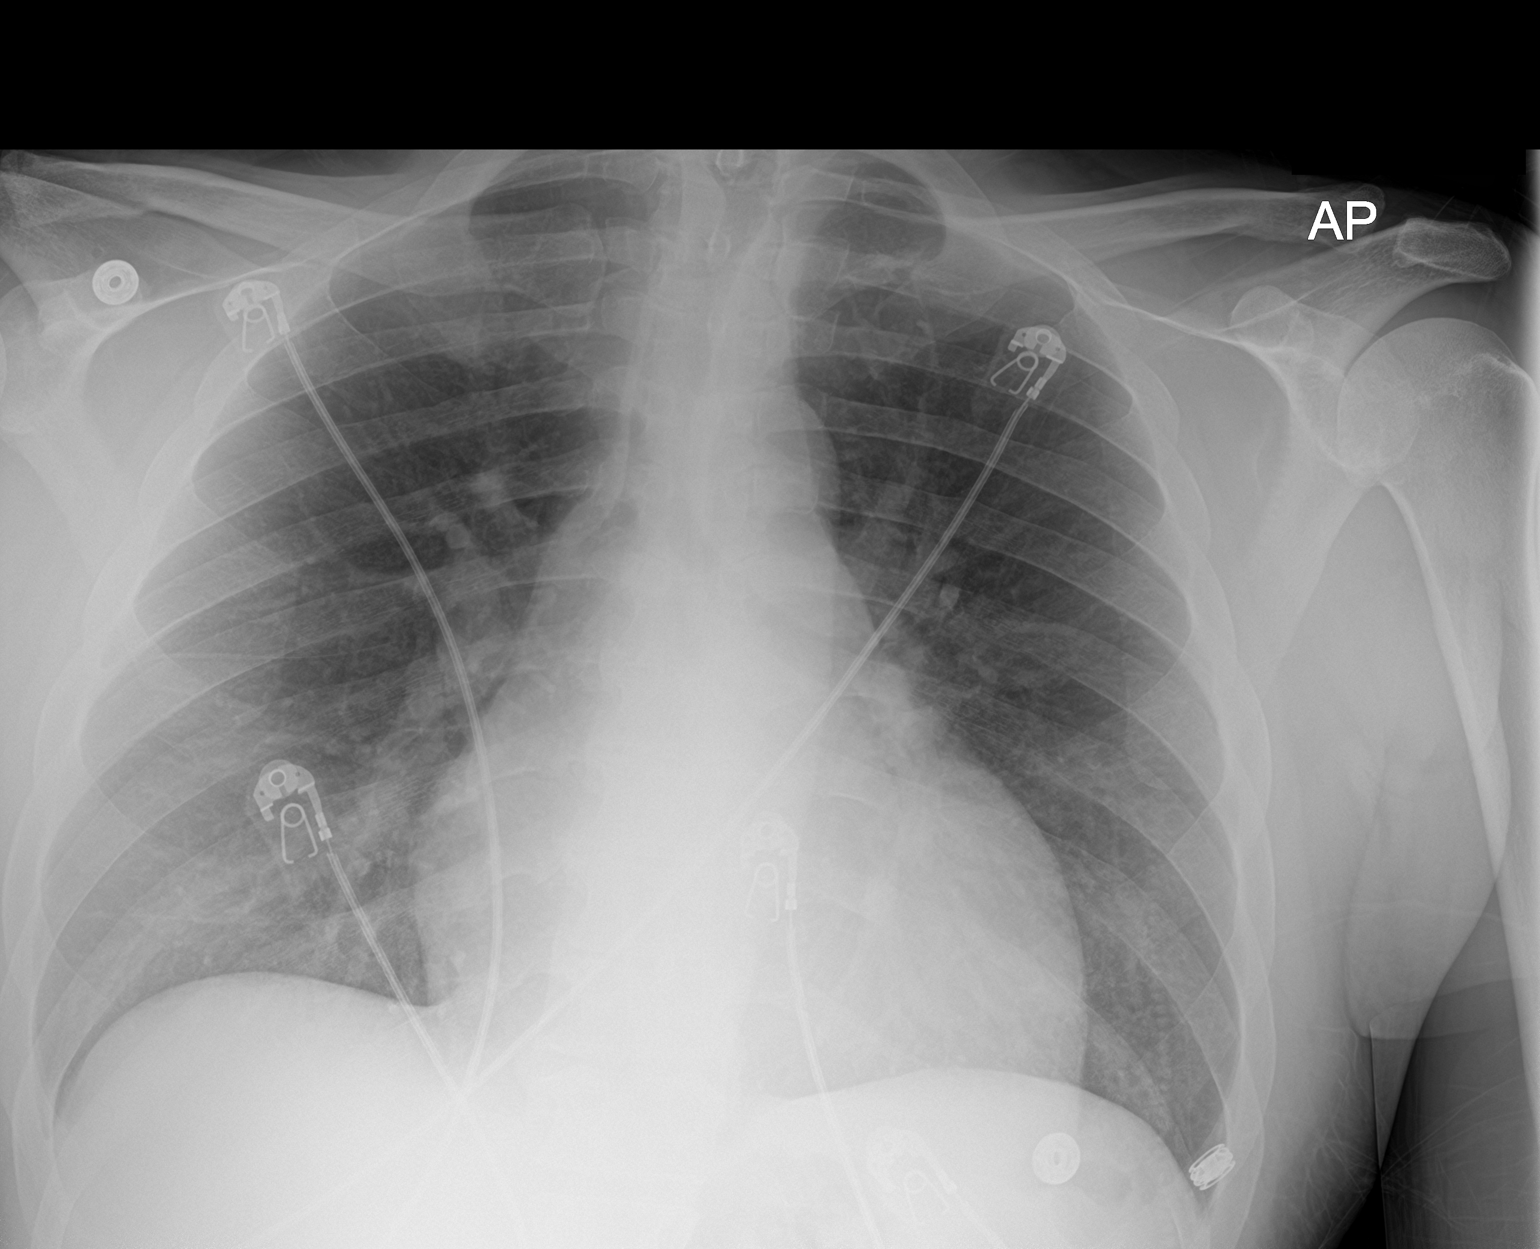

[chest lat]
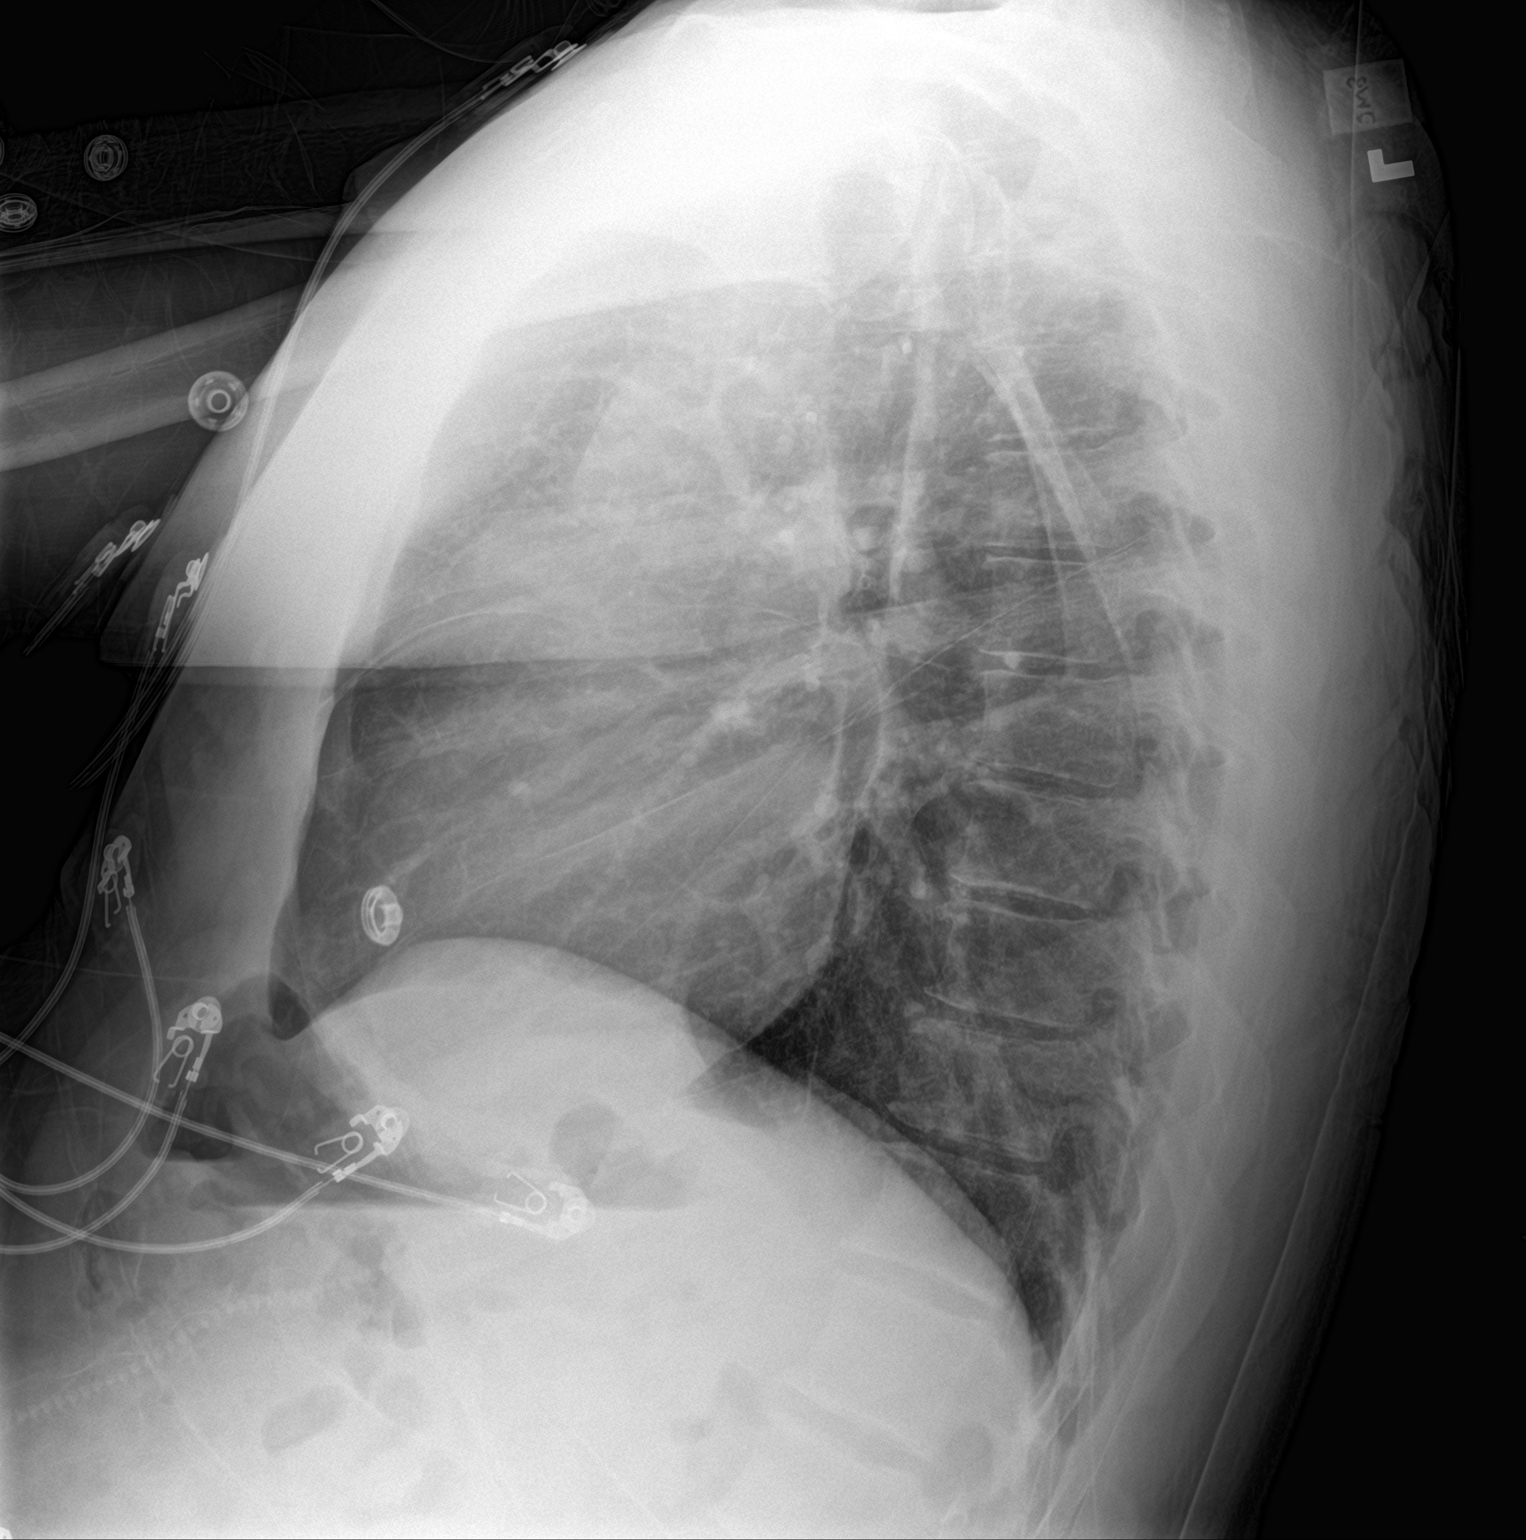

[2 of 2 positions shown; findings below may reference images not displayed]

FINDINGS: Borderline enlargement of cardiac silhouette with slight pulmonary
vascular congestion.

Mediastinal contours and pulmonary vascularity normal.

Lungs clear.

No pleural effusion or pneumothorax.

Bones unremarkable.
IMPRESSION: No acute abnormalities.

Borderline enlargement of cardiac silhouette with slight pulmonary
vascular congestion

## 2019-04-15 MED FILL — hydrALAZINE HCL 100 MG TABS: 100 | 30 days supply | Qty: 90 | Fill #9

## 2019-04-15 NOTE — Telephone Encounter (Signed)
Faxed last OV with Dr. Marlou Porch and last 3 visits with hypertension clinic.

## 2019-04-18 ENCOUNTER — Encounter (HOSPITAL_COMMUNITY): Payer: Self-pay | Admitting: Emergency Medicine

## 2019-04-18 ENCOUNTER — Ambulatory Visit (HOSPITAL_COMMUNITY)
Admission: EM | Admit: 2019-04-18 | Discharge: 2019-04-18 | Disposition: A | Discharge status: Home / Self Care | Payer: Medicaid Other | Attending: Internal Medicine | Admitting: Internal Medicine

## 2019-04-18 ENCOUNTER — Telehealth: Payer: Self-pay

## 2019-04-18 ENCOUNTER — Other Ambulatory Visit: Payer: Self-pay

## 2019-04-18 DIAGNOSIS — R112 Nausea with vomiting, unspecified: Secondary | ICD-10-CM

## 2019-04-18 MED ORDER — ONDANSETRON 4 MG PO TBDP: 4.0000 mg | ORAL_TABLET | Freq: Three times a day (TID) | ORAL | 0 refills | Status: DC | PRN

## 2019-04-18 MED FILL — ONDANSETRON ODT 4 MG TABLET: 4 | 6 days supply | Qty: 20 | Fill #0

## 2019-04-18 NOTE — Telephone Encounter (Signed)
FYI- Brother states that patient blood pressure is very elevated and he is having abdominal pain and vomiting. Patient was advise to go to ED and to follow up with are office afterward. Patient brother advise me that they would go.

## 2019-04-18 NOTE — ED Provider Notes (Signed)
[place]    CSN: [id number] Arrival date & time: 04/18/19  1347      History   Chief Complaint Chief Complaint  Patient presents with  . Emesis    HPI Stanley [name] is a 42 y.o. male with a history of chronic kidney disease stage IV, hypertension on antihypertensive medications comes to urgent care with complaints of nausea and vomiting today.  Patient was bitten by a spider a few days ago and was started on Bactrim.  He tolerated the Bactrim until today.  He had emesis right after taking his Bactrim.  He denies any rash, shortness of breath, throat swelling, difficulty breathing. HPI  Past Medical History:  Diagnosis Date  . Anxiety   . CKD (chronic kidney disease), stage IV ([place])   . Depression   . Dyslexia   . Heart palpitations   . High cholesterol   . Hypertension   . Hypertensive urgency   . Stroke ([place]) 06/03/2017   "minor; completely recovered today" (06/13/2017)  . Tobacco abuse     Patient Active Problem List   Diagnosis Date Noted  . Heart palpitations 02/23/2019  . Anxiety 12/19/2018  . Acute pain of right knee 12/19/2018  . Inflammation of joint of right knee 12/19/2018  . Swollen testicle 12/19/2018  . Syncope and collapse   . Transient vision disturbance of left eye   . Hydrocele   . Hypertensive urgency 06/12/2017  . CKD stage 4 secondary to hypertension ([place]) 03/01/2017  . Anemia, chronic disease 03/01/2017  . Hyperparathyroidism, secondary renal ([place]) 03/01/2017  . Hypertensive emergency 09/26/2016  . Tobacco abuse 09/26/2016  . Uncontrolled hypertension 09/26/2016  . AKI (acute kidney injury) ([place]) 09/26/2016    Past Surgical History:  Procedure Laterality Date  . NO PAST SURGERIES         Home Medications    Prior to Admission medications   Medication Sig Start Date End Date Taking? Authorizing Provider  acetaminophen (TYLENOL) 500 MG tablet Take 1,000 mg by mouth as needed for mild pain.    [provider]   albuterol (PROVENTIL HFA;VENTOLIN HFA) 108 (90 Base) MCG/ACT inhaler Inhale 2 puffs into the lungs every 4 (four) hours as needed for wheezing or shortness of breath (cough, shortness of breath or wheezing.). 11/24/17   Scot Jun, FNP  amLODipine (NORVASC) 10 MG tablet Take 1 tablet (10 mg total) by mouth daily. 03/11/19   Jerline Pain, MD  aspirin EC 81 MG tablet Take 81 mg by mouth daily.    [provider]  atorvastatin (LIPITOR) 40 MG tablet Take 1 tablet (40 mg total) by mouth daily at 6 PM. 05/22/18   Jerline Pain, MD  busPIRone (BUSPAR) 5 MG tablet Take 1 tablet (5 mg total) by mouth 2 (two) times daily. 12/19/18   [name], FNP  cloNIDine (CATAPRES) 0.3 MG tablet Take 1 tablet (0.3 mg total) by mouth 3 (three) times daily. 05/22/18   Jerline Pain, MD  doxazosin (CARDURA) 4 MG tablet Take 1 tablet (4 mg total) by mouth daily. 04/08/19   Jerline Pain, MD  furosemide (LASIX) 20 MG tablet Take 1 tablet (20 mg total) by mouth daily as needed for up to 30 days. 02/21/19 03/23/19  [name], FNP  hydrALAZINE (APRESOLINE) 100 MG tablet Take 1 tablet (100 mg total) by mouth 3 (three) times daily. 05/22/18   Jerline Pain, MD  labetalol (NORMODYNE) 200 MG tablet TAKE 2 TABLETS (  400 MG TOTAL) BY MOUTH 3 (THREE) TIMES DAILY. 12/31/18   Jerline Pain, MD  metoprolol succinate (TOPROL-XL) 50 MG 24 hr tablet Take 1 tablet (50 mg total) by mouth daily. Take with or immediately following a meal. 05/22/18   Jerline Pain, MD  Multiple Vitamin (MULTIVITAMIN WITH MINERALS) TABS tablet Take 1 tablet by mouth daily as needed.    [provider]  nicotine (NICODERM CQ - DOSED IN MG/24 HOURS) 21 mg/24hr patch Place 1 patch (21 mg total) onto the skin daily. Place 1 patch onto the skin daily for 6 weeks. Call [phone] for next prescription 03/25/19   Jerline Pain, MD  nicotine polacrilex (NICORETTE) 2 MG gum Take 1 each (2 mg total) by mouth as needed for smoking  cessation. 03/25/19   Jerline Pain, MD  nitroGLYCERIN (NITROSTAT) 0.4 MG SL tablet Place 1 tablet (0.4 mg total) under the tongue every 5 (five) minutes as needed for chest pain. 02/20/19   [name], FNP  Potassium Gluconate 595 MG CAPS Take 1 capsule by mouth daily.    [provider]  PSYLLIUM PO Take 1 tablet by mouth daily.    [provider]  sulfamethoxazole-trimethoprim (BACTRIM DS) 800-160 MG tablet Take 1 tablet by mouth 2 (two) times daily for 10 days. 04/11/19 04/21/19  Lanae Boast, FNP  traZODone (DESYREL) 100 MG tablet Take 1 tablet (100 mg total) by mouth at bedtime as needed for sleep. 11/28/18   [name], FNP    Family History Family History  Problem Relation Age of Onset  . Hypertension Mother   . Hypertension Brother   . Diabetes type II Brother     Social History Social History   Tobacco Use  . Smoking status: Current Every Day Smoker    Packs/day: 1.00    Years: 30.00    Pack years: 30.00    Types: Cigarettes  . Smokeless tobacco: Never Used  Substance Use Topics  . Alcohol use: No  . Drug use: Yes    Types: Marijuana    Comment: 06/13/2017 "not often"     Allergies   Lactose intolerance (gi), Other, Isosorbide, and Nitroglycerin   Review of Systems Review of Systems  Constitutional: Negative for activity change, chills, fatigue and fever.  HENT: Negative.   Eyes: Negative for pain, discharge, redness and itching.  Gastrointestinal: Positive for nausea and vomiting. Negative for abdominal pain and diarrhea.  Genitourinary: Negative.   Musculoskeletal: Negative for arthralgias, joint swelling and myalgias.  Skin: Negative for pallor, rash and wound.  Neurological: Negative for dizziness, weakness, numbness and headaches.     Physical Exam Triage Vital Signs ED Triage Vitals  Enc Vitals Group     BP 04/18/19 1404 (!) 145/81     Pulse Rate 04/18/19 1404 67     Resp 04/18/19 1404 18     Temp 04/18/19 1404 98.5  F (36.9 C)     Temp Source 04/18/19 1404 Oral     SpO2 04/18/19 1404 96 %     Weight --      Height --      Head Circumference --      Peak Flow --      Pain Score 04/18/19 1405 0     Pain Loc --      Pain Edu? --      Excl. in [place]? --    No data found.  Updated Vital Signs BP (!) 145/81 (BP Location: Right  Arm)   Pulse 67   Temp 98.5 F (36.9 C) (Oral)   Resp 18   SpO2 96%   Visual Acuity Right Eye Distance:   Left Eye Distance:   Bilateral Distance:    Right Eye Near:   Left Eye Near:    Bilateral Near:     Physical Exam Constitutional:      Appearance: Normal appearance. He is not ill-appearing or toxic-appearing.  Cardiovascular:     Rate and Rhythm: Normal rate and regular rhythm.  Pulmonary:     Effort: Pulmonary effort is normal. No respiratory distress.     Breath sounds: Normal breath sounds. No wheezing or rales.  Abdominal:     General: Bowel sounds are normal. There is no distension.     Palpations: Abdomen is soft.     Tenderness: There is no abdominal tenderness. There is no guarding.  Musculoskeletal: Normal range of motion.        General: No swelling or deformity.  Skin:    General: Skin is warm.     Capillary Refill: Capillary refill takes less than 2 seconds.  Neurological:     General: No focal deficit present.     Mental Status: He is alert.      UC Treatments / Results  Labs (all labs ordered are listed, but only abnormal results are displayed) Labs Reviewed - No data to display  EKG   Radiology No results found.  Procedures Procedures (including critical care time)  Medications Ordered in UC Medications - No data to display  Initial Impression / Assessment and Plan / UC Course  I have reviewed the triage vital signs and the nursing notes.  Pertinent labs & imaging results that were available during my care of the patient were reviewed by me and considered in my medical decision making (see chart for details).      1.  Nausea with vomiting secondary to Bactrim use: Discontinue Bactrim use Zofran as needed for nausea/vomiting  2.  Spider bite with healing ulceration: Local wound dressing If patient symptoms worsen he is advised to come to urgent care to be reevaluated Final Clinical Impressions(s) / UC Diagnoses   Final diagnoses:  None   Discharge Instructions   None    ED Prescriptions    None     Controlled Substance Prescriptions [place] Controlled Substance Registry consulted? No   Chase Picket, MD 04/18/19 1426

## 2019-04-18 NOTE — ED Triage Notes (Signed)
Pt sts vomiting after taking meds today; pt sts currently taking antibiotics for spider bite

## 2019-04-22 ENCOUNTER — Telehealth: Payer: Self-pay

## 2019-04-22 ENCOUNTER — Other Ambulatory Visit: Payer: Self-pay

## 2019-04-22 DIAGNOSIS — R002 Palpitations: Secondary | ICD-10-CM

## 2019-04-22 MED FILL — ?METOPROLOL SUCC ER 50MG TA: 50 | 30 days supply | Qty: 30 | Fill #9

## 2019-04-22 MED FILL — ?FUROSEMIDE 20 MG TABLET: 20 | 30 days supply | Qty: 30 | Fill #2

## 2019-04-22 MED FILL — cloNIDine HCL 0.3 MG TABS: 0.3 | 30 days supply | Qty: 90 | Fill #9

## 2019-04-22 MED FILL — LABETALOL HCL 200 MG TABLET: 200 | 30 days supply | Qty: 180 | Fill #3

## 2019-04-22 MED FILL — DOXAZOSIN MESYLATE 2 MG TAB: 2 | 30 days supply | Qty: 30 | Fill #1

## 2019-04-22 NOTE — Telephone Encounter (Signed)

## 2019-04-23 ENCOUNTER — Ambulatory Visit: Payer: Self-pay | Admitting: Family Medicine

## 2019-04-24 ENCOUNTER — Encounter: Payer: Self-pay | Admitting: Pharmacist

## 2019-04-24 ENCOUNTER — Other Ambulatory Visit: Payer: Self-pay

## 2019-04-24 ENCOUNTER — Ambulatory Visit (INDEPENDENT_AMBULATORY_CARE_PROVIDER_SITE_OTHER): Payer: Self-pay | Admitting: Pharmacist

## 2019-04-24 ENCOUNTER — Other Ambulatory Visit: Payer: Self-pay | Admitting: *Deleted

## 2019-04-24 VITALS — BP 142/89 | HR 66

## 2019-04-24 DIAGNOSIS — Z720 Tobacco use: Secondary | ICD-10-CM

## 2019-04-24 DIAGNOSIS — R002 Palpitations: Secondary | ICD-10-CM

## 2019-04-24 DIAGNOSIS — I10 Essential (primary) hypertension: Secondary | ICD-10-CM

## 2019-04-24 LAB — COMPREHENSIVE METABOLIC PANEL
ALT: 8 IU/L (ref 0–44)
AST: 13 IU/L (ref 0–40)
Albumin/Globulin Ratio: 2 (ref 1.2–2.2)
Albumin: 4.5 g/dL (ref 4.0–5.0)
Alkaline Phosphatase: 90 IU/L (ref 39–117)
BUN/Creatinine Ratio: 8 — ABNORMAL LOW (ref 9–20)
BUN: 55 mg/dL — ABNORMAL HIGH (ref 6–24)
Bilirubin Total: <0.2 mg/dL (ref 0.0–1.2)
CO2: 17 mmol/L — ABNORMAL LOW (ref 20–29)
Calcium: 9 mg/dL (ref 8.7–10.2)
Chloride: 104 mmol/L (ref 96–106)
Creatinine, Ser: 7.21 mg/dL — ABNORMAL HIGH (ref 0.76–1.27)
GFR calc Af Amer: 10 mL/min/{1.73_m2} — ABNORMAL LOW (ref >59)
GFR calc non Af Amer: 9 mL/min/{1.73_m2} — ABNORMAL LOW (ref >59)
Globulin, Total: 2.2 g/dL (ref 1.5–4.5)
Glucose: 86 mg/dL (ref 65–99)
Potassium: 4.9 mmol/L (ref 3.5–5.2)
Sodium: 141 mmol/L (ref 134–144)
Total Protein: 6.7 g/dL (ref 6.0–8.5)

## 2019-04-24 MED ORDER — DOXAZOSIN MESYLATE 8 MG PO TABS: 8.0000 mg | ORAL_TABLET | Freq: Every day | ORAL | 11 refills | Status: DC

## 2019-04-24 MED FILL — DOXAZOSIN MESYLATE 8 MG TAB: 8 | 30 days supply | Qty: 30 | Fill #0

## 2019-04-24 NOTE — Progress Notes (Addendum)
Patient ID: Stanley [name]                 DOB: [birth year]                      MRN: [id number]     HPI: Stanley [name] is a 43 y.o. male referred by Dr. Marlou [name] to HTN clinic. PMH is significant for hypertensive emergency, LVH, chest pain, CKD stage IV, stroke, HLD, tobacco abuse, and anxiety/depression. BP was most recently elevated at 191/107 during telehealth visit on 6/22 with HTN clinic. Dr Stanley [name] with nephrology has previously been managing patient's BP. He has not seen the nephrologist in about 6 months. At last visit with HTN clinic, doxazosin was increased to 4mg  at bedtime. He was asked to weigh himself every morning. He was also asked to check him blood pressure once a day.  Of note, patient is on two beta blockers. Due to the limited amount of medication options for patient due to CKD (cannot use ACE/ARB or aldosterone antagonists) and the lack of evidence that there is any bradycardia, it has been decided to leave as is. Patient was seen in the ED last week for nausea and vomiting secondary to bactrim. He was bitten by a spider a few days prior to this and was placed on bactrim. His blood pressure in the ED was 145/81. HR 67.  We will be checking a CMP today to follow up on patients renal function.  Pt presents today in good spirits. When discussing his blood pressure, he states he feels dizzy and has blurred vision typically when he stands up too quickly. Educated pt that this is likely d/t to orthostatic hypotension and make sure to move slowly when changing from sitting to standing. Patient denies orthostatics being any worse after starting doxazosin.   Also, pt is concerned that it is challenging to afford his medications and will feel dizzy, SOB or have chest pain when he run out. He runs out of medications ~1 week/month. Pt states he is pursuing Medicaid application. We have provided him instructions on how to apply online and I have also reached out to Stanley [name], Education officer, [employer], to  see if there is anything she can do to assist.  Pt is still interested in pursuing smoking cessation. He has went from smoking 1 pack per 2 days to 1 pack per week. He is currently using the nicotine patch and does not smoke with the patch on; re-iterated to pt that smoking with the patch on can increase risk of nicotine toxicity therefore encouraged him for not doing so. Pt stated he is interested in using the nicotine gum. Reminded pt that nicotine gum rx was sent to the pharmacy, however, he is unable to remember why he was not able to obtain the prescription.   Of note he states that his O2 stats are usually in the 70's; confirmed that this was likely a machine error considering his stats at today's visit and his prior ED visit (04/18/19) were in the 90s.  Home BP readings: Pt did not bring BP log.   Current HTN meds: amlodipine 10mg  daily, clonidine 0.3mg  TID, furosemide 20mg  prn - taking daily in AM, hydralazine 100mg  TID, labetalol 400mg  TID, Toprol 50mg  daily, doxazosin 4mg  at bedtime  Previously tried: isosorbide (headaches)  BP goal: <130/74mmHg  Family History: The patient's family history includes Diabetes type II in his brother; Hypertension in his brother and mother.  Social History: Currently smokes  1 PPD, denies alcohol use, occasionally uses marijuana.  Diet: Breakfast - [place] sausage, eggs, oranges. Lunch - sandwich. Eats a lot of vegetables. Dinner - meat, starch, veggie. Only eats beef 2x a month, mostly eats chicken, veal, or [place]. Does not add any salt to food, mom buys foods with < 10% sodium. Caffeine is limited to 1 cup of coffee each day, occasionally sweet tea.  Exercise: Limited - medication makes him very tired/weak.  Wt Readings from Last 3 Encounters:  04/11/19 210 lb (95.3 kg)  02/20/19 218 lb (98.9 kg)  12/19/18 211 lb (95.7 kg)   BP Readings from Last 3 Encounters:  04/18/19 (!) 145/81  04/11/19 (!) 178/100  04/06/19 (!) 201/108   Pulse Readings  from Last 3 Encounters:  04/18/19 67  04/11/19 72  04/06/19 66    Renal function: CrCl cannot be calculated (Patient's most recent lab result is older than the maximum 21 days allowed.).  Past Medical History:  Diagnosis Date  . Anxiety   . CKD (chronic kidney disease), stage IV ([place])   . Depression   . Dyslexia   . Heart palpitations   . High cholesterol   . Hypertension   . Hypertensive urgency   . Stroke ([place]) 06/03/2017   "minor; completely recovered today" (06/13/2017)  . Tobacco abuse     Current Outpatient Medications on File Prior to Visit  Medication Sig Dispense Refill  . acetaminophen (TYLENOL) 500 MG tablet Take 1,000 mg by mouth as needed for mild pain.    Marland Kitchen albuterol (PROVENTIL HFA;VENTOLIN HFA) 108 (90 Base) MCG/ACT inhaler Inhale 2 puffs into the lungs every 4 (four) hours as needed for wheezing or shortness of breath (cough, shortness of breath or wheezing.). 1 Inhaler 1  . amLODipine (NORVASC) 10 MG tablet Take 1 tablet (10 mg total) by mouth daily. 30 tablet 11  . aspirin EC 81 MG tablet Take 81 mg by mouth daily.    Marland Kitchen atorvastatin (LIPITOR) 40 MG tablet Take 1 tablet (40 mg total) by mouth daily at 6 PM. 90 tablet 3  . busPIRone (BUSPAR) 5 MG tablet Take 1 tablet (5 mg total) by mouth 2 (two) times daily. 60 tablet 1  . cloNIDine (CATAPRES) 0.3 MG tablet Take 1 tablet (0.3 mg total) by mouth 3 (three) times daily. 270 tablet 3  . doxazosin (CARDURA) 4 MG tablet Take 1 tablet (4 mg total) by mouth daily. 30 tablet 11  . furosemide (LASIX) 20 MG tablet Take 1 tablet (20 mg total) by mouth daily as needed for up to 30 days. 30 tablet 2  . hydrALAZINE (APRESOLINE) 100 MG tablet Take 1 tablet (100 mg total) by mouth 3 (three) times daily. 270 tablet 3  . labetalol (NORMODYNE) 200 MG tablet TAKE 2 TABLETS (400 MG TOTAL) BY MOUTH 3 (THREE) TIMES DAILY. 540 tablet 1  . metoprolol succinate (TOPROL-XL) 50 MG 24 hr tablet Take 1 tablet (50 mg total) by mouth daily. Take  with or immediately following a meal. 90 tablet 3  . Multiple Vitamin (MULTIVITAMIN WITH MINERALS) TABS tablet Take 1 tablet by mouth daily as needed.    . nicotine (NICODERM CQ - DOSED IN MG/24 HOURS) 21 mg/24hr patch Place 1 patch (21 mg total) onto the skin daily. Place 1 patch onto the skin daily for 6 weeks. Call [phone] for next prescription 28 patch 1  . nicotine polacrilex (NICORETTE) 2 MG gum Take 1 each (2 mg total) by mouth as needed for smoking cessation.  100 tablet 0  . nitroGLYCERIN (NITROSTAT) 0.4 MG SL tablet Place 1 tablet (0.4 mg total) under the tongue every 5 (five) minutes as needed for chest pain. 50 tablet 3  . ondansetron (ZOFRAN ODT) 4 MG disintegrating tablet Take 1 tablet (4 mg total) by mouth every 8 (eight) hours as needed for nausea or vomiting. 20 tablet 0  . Potassium Gluconate 595 MG CAPS Take 1 capsule by mouth daily.    . PSYLLIUM PO Take 1 tablet by mouth daily.    . traZODone (DESYREL) 100 MG tablet Take 1 tablet (100 mg total) by mouth at bedtime as needed for sleep. 30 tablet 3   No current facility-administered medications on file prior to visit.     Allergies  Allergen Reactions  . Lactose Intolerance (Gi) Nausea And Vomiting  . Other Swelling    All beans  . Isosorbide     headaches  . Nitroglycerin Anxiety and Other (See Comments)    Pain and feels like he is on fire     Assessment/Plan:  1. Hypertension - BP remains above goal <130/48mmHg, but significant improvement from past visits.  Pt did not bring in home BP readings, however, at today's visit BP was 142/89 mmHg. Pt is currently taking amlodipine 10mg  daily, clonidine 0.3mg  TID, furosemide 20mg  prn - taking daily in AM, hydralazine 100mg  TID, labetalol 400mg  TID, Toprol 50mg  daily, doxazosin 4mg  at bedtime. Recommend increasing doxazosin from 4 mg daily to 8 mg daily. Will f/u with pt pm 05/21/19 to determine effects of dosage increase on BP. If possible in future visits would like to  get patient off of two beta blockers as this is most likely contributing to his fatigue and in ability to exercise.  2.Tobacco abuse - Pt has made improvements with smoking (1 pack/2 days to 1 pack/week). Encouraged pt for his success. Pt hasnt been using nicotine patch daily. Encouraged patient to use nicotine patch daily and not buy any more cigarettes. He has been unable to obtain nicotine gum. Pt is interested in using gum and stated he is willing to try again to obtain from pharmacy. Will f/u with pt regarding smoking cessation at f/u visit.   Patient seen by Stanley [name], PharmD, PGY2 Resident in conjunction with:  Ramond Dial, Pharm.D, [facility]  [address] [place], [place] [address]  Phone: [phone]; Fax: [phone]  04/24/2019 6:54 AM

## 2019-04-24 NOTE — Patient Instructions (Addendum)
Thank you for following up in clinic today, it was a pleasure seeing you!  The plan today is...  1. Go to the following wesbite: [email or web address]. Press the blue button labeled "Apply Online Now". After filling out application, call [phone] to discuss the status of application.   2. Plan to increase doxazosin from 4 mg daily to 8 mg daily. It is okay to take 2 tablets of the 4 mg dose until you finish your prescription. Afterwards, you can pick up the doxazosin 8 mg daily prescription.   3. Continue to take amlodipine, clonidine, furosemide, hydralazine, labetalol, and metoprolol tartrate as prescribed.  4. Great job at your effort to quit smoking! Continue to use your nicotine patch daily and if possible to try to start using nicotine gum.

## 2019-05-01 ENCOUNTER — Other Ambulatory Visit: Payer: Self-pay | Admitting: Family Medicine

## 2019-05-01 DIAGNOSIS — G4700 Insomnia, unspecified: Secondary | ICD-10-CM

## 2019-05-01 MED FILL — traZODone HCL 100 MG TABS: 100 | 30 days supply | Qty: 30 | Fill #0

## 2019-05-01 MED FILL — ?ATORVASTATIN 40MG TABLET: 40 | 30 days supply | Qty: 30 | Fill #7

## 2019-05-06 MED FILL — ?AMLODIPINE BESYLATE 10 MG: 10 | 30 days supply | Qty: 30 | Fill #2

## 2019-05-08 ENCOUNTER — Encounter: Payer: Self-pay | Admitting: *Deleted

## 2019-05-20 NOTE — Progress Notes (Unsigned)
Patient ID: Stanley [name]                 DOB: 1976-09-02                      MRN: [id number]     HPI: Stanley [name] is a 43 y.o. male referred by Dr. Marlou Porch to HTN clinic. PMH is significant for hypertensive emergency, LVH, chest pain, CKD stage IV, stroke, HLD, tobacco abuse, and anxiety/depression. Pt's hypertension was previously managed by his nephrologist, Dr. [name]. It is pertinent to note that pt is on two beta blockers. Pt will continue dual beta blocker therapy considering the limited medication options for this pt d/t CKD and how his HR is stable. However, it is a long-term goal to D/C dual beta blocker agents considering these medictions are likely contributing to pt's complaints of fatigue. At last visit (04/24/19), doxazosin was increased from 4 mg daily to 8 mg daily and a BMP was obtained. The BMP indicated a significant rise in pt's Scr (3.24 (05/18/18) to 7.21 (04/24/19).   ***Make sure pt is notified of recent BMP results and f/u with Dr. [name]. In the meantime, inform him to D/C anything that could be contributing to increased serum creatinine (e.g., NSAIDs, APAP, loop diuretics, illicit drug use (cocaine, synthetic cannabinoids ))  ***Tolerating dose increase?  ***Assess affordability of medications and Medicaid application  ***Assess smoking cessation (was smoking 1 pack/2days to 1 pack/week) and was using nicotine patch at last appt. Was he able to purchase nicotine gum?  Current HTN meds: amlodipine 10mg  daily, clonidine 0.3mg  TID, furosemide 20mg  prn - taking daily in AM, hydralazine 100mg  TID, labetalol 400mg  TID, Toprol 50mg  daily, doxazosin 8mg  at bedtime  Previously tried: isosorbide (headaches)  BP goal: <130/58mmHg  Family History: The patient'sfamily history includes Diabetes type II in his brother; Hypertension in his brother and mother.  Social History: Currently smokes 1 PPD, denies alcohol use, occasionally uses marijuana.  Diet: Breakfast -  [place] sausage, eggs, oranges. Lunch - sandwich. Eats a lot of vegetables. Dinner - meat, starch, veggie. Only eats beef 2x a month, mostly eats chicken, veal, or [place]. Does not add any salt to food, mom buys foods with < 10% sodium. Caffeine is limited to 1 cup of coffee each day, occasionally sweet tea.  Exercise: Limited - medication makes him very tired/weak.  Home BP readings:   Wt Readings from Last 3 Encounters:  04/11/19 210 lb (95.3 kg)  02/20/19 218 lb (98.9 kg)  12/19/18 211 lb (95.7 kg)   BP Readings from Last 3 Encounters:  04/24/19 (!) 142/89  04/18/19 (!) 145/81  04/11/19 (!) 178/100   Pulse Readings from Last 3 Encounters:  04/24/19 66  04/18/19 67  04/11/19 72    Microalbumin/creatinine ratio: 870.1 (03/01/2018)  Renal function: CrCl cannot be calculated (Patient's most recent lab result is older than the maximum 21 days allowed.).  Past Medical History:  Diagnosis Date  . Anxiety   . CKD (chronic kidney disease), stage IV ([place])   . Depression   . Dyslexia   . Heart palpitations   . High cholesterol   . Hypertension   . Hypertensive urgency   . Stroke ([place]) 06/03/2017   "minor; completely recovered today" (06/13/2017)  . Tobacco abuse     Current Outpatient Medications on File Prior to Visit  Medication Sig Dispense Refill  . acetaminophen (TYLENOL) 500 MG tablet Take 1,000 mg by mouth as needed for  mild pain.    Marland Kitchen albuterol (PROVENTIL HFA;VENTOLIN HFA) 108 (90 Base) MCG/ACT inhaler Inhale 2 puffs into the lungs every 4 (four) hours as needed for wheezing or shortness of breath (cough, shortness of breath or wheezing.). 1 Inhaler 1  . amLODipine (NORVASC) 10 MG tablet Take 1 tablet (10 mg total) by mouth daily. 30 tablet 11  . aspirin EC 81 MG tablet Take 81 mg by mouth daily.    Marland Kitchen atorvastatin (LIPITOR) 40 MG tablet Take 1 tablet (40 mg total) by mouth daily at 6 PM. 90 tablet 3  . busPIRone (BUSPAR) 5 MG tablet Take 1 tablet (5 mg total) by mouth  2 (two) times daily. 60 tablet 1  . cloNIDine (CATAPRES) 0.3 MG tablet Take 1 tablet (0.3 mg total) by mouth 3 (three) times daily. 270 tablet 3  . doxazosin (CARDURA) 8 MG tablet Take 1 tablet (8 mg total) by mouth daily. 30 tablet 11  . furosemide (LASIX) 20 MG tablet Take 1 tablet (20 mg total) by mouth daily as needed for up to 30 days. 30 tablet 2  . hydrALAZINE (APRESOLINE) 100 MG tablet Take 1 tablet (100 mg total) by mouth 3 (three) times daily. 270 tablet 3  . labetalol (NORMODYNE) 200 MG tablet TAKE 2 TABLETS (400 MG TOTAL) BY MOUTH 3 (THREE) TIMES DAILY. 540 tablet 1  . metoprolol succinate (TOPROL-XL) 50 MG 24 hr tablet Take 1 tablet (50 mg total) by mouth daily. Take with or immediately following a meal. 90 tablet 3  . Multiple Vitamin (MULTIVITAMIN WITH MINERALS) TABS tablet Take 1 tablet by mouth daily as needed.    . nicotine (NICODERM CQ - DOSED IN MG/24 HOURS) 21 mg/24hr patch Place 1 patch (21 mg total) onto the skin daily. Place 1 patch onto the skin daily for 6 weeks. Call [phone] for next prescription 28 patch 1  . nicotine polacrilex (NICORETTE) 2 MG gum Take 1 each (2 mg total) by mouth as needed for smoking cessation. 100 tablet 0  . nitroGLYCERIN (NITROSTAT) 0.4 MG SL tablet Place 1 tablet (0.4 mg total) under the tongue every 5 (five) minutes as needed for chest pain. 50 tablet 3  . ondansetron (ZOFRAN ODT) 4 MG disintegrating tablet Take 1 tablet (4 mg total) by mouth every 8 (eight) hours as needed for nausea or vomiting. 20 tablet 0  . Potassium Gluconate 595 MG CAPS Take 1 capsule by mouth daily.    . PSYLLIUM PO Take 1 tablet by mouth daily.    . traZODone (DESYREL) 100 MG tablet TAKE 1 TABLET (100 MG TOTAL) BY MOUTH AT BEDTIME AS NEEDED FOR SLEEP. 30 tablet 3   No current facility-administered medications on file prior to visit.     Allergies  Allergen Reactions  . Lactose Intolerance (Gi) Nausea And Vomiting  . Other Swelling    All beans  . Isosorbide      headaches  . Nitroglycerin Anxiety and Other (See Comments)    Pain and feels like he is on fire     Assessment/Plan:  1. Stage 2 AKI - Pt's Scr has increased 2.2x from baseline (Scr 3.24 in 05/2018 and 7.21 04/2019). Refer to ED.   2. Hypertension - BP goal is < 130 mmHg. Pt's most recent microalbumin/creatinine ratio was significantly elevated in 2019. Plan to get updated microalbumin/creatinine ratio.    Consider switching patient from amlodipine 10 mg daily to diltiazem 120 mg daily to decrease proteinuria.   Future plan: After AKI resolves,  plan on tapering off of metoprolol and initiating lisinopril 10 mg daily to further decrease proteinuria (higher proteinuria levels are associated with faster progression of CKD and inc risk fo CVD) . Consider switching labetalol to carvedilol 6.25 mg twice daily ($4 list on [facility]; more affordable).  3. Smoking Cessation -    Thank you for involving pharmacy to assist in providing Mr. Leh's care.  Drexel Iha, PharmD PGY2 Ambulatory Care Pharmacy Resident

## 2019-05-21 ENCOUNTER — Ambulatory Visit: Payer: Self-pay

## 2019-05-27 ENCOUNTER — Other Ambulatory Visit: Payer: Self-pay | Admitting: Cardiology

## 2019-05-27 DIAGNOSIS — I10 Essential (primary) hypertension: Secondary | ICD-10-CM

## 2019-05-27 MED FILL — ?AMLODIPINE BESYLATE 10 MG: 10 | 30 days supply | Qty: 30 | Fill #2

## 2019-05-28 ENCOUNTER — Telehealth: Payer: Self-pay | Admitting: Pharmacist

## 2019-05-28 MED FILL — hydrALAZINE HCL 100 MG TABS: 100 | 30 days supply | Qty: 90 | Fill #0

## 2019-05-28 MED FILL — cloNIDine HCL 0.3 MG TABS: 0.3 | 30 days supply | Qty: 90 | Fill #0

## 2019-05-28 NOTE — Telephone Encounter (Signed)
Called Dr. Elissa Hefty office at 9:00AM on 05/28/19 to determine if he has seen pt's most recent BMP d/t concern for elevated Scr. Spoke with Stanley [name] in medical records who stated [name] is no longer a patient of Dr. Marval [name] due to multiple prior no show appts. Pt has not been seen by Dr. Marval [name] since 06/2018.   Attempted to call pt. He also missed his appt in PharmD HTN clinic last week (05/21/19). Mobile - Phone number is disconnected.  Home - Phone number worked, however, Research scientist (medical) was full. Work - Designer, industrial/product number worked; left a Set designer with directions to call back Stanley [name] clinic to discuss labs.  It appears lab results were mailed to Stanley [name]. Plan to wait for a returned phone call from pt.

## 2019-05-29 ENCOUNTER — Ambulatory Visit: Payer: Self-pay | Admitting: Family Medicine

## 2019-05-31 ENCOUNTER — Ambulatory Visit (INDEPENDENT_AMBULATORY_CARE_PROVIDER_SITE_OTHER): Payer: Self-pay | Admitting: Family Medicine

## 2019-05-31 ENCOUNTER — Encounter: Payer: Self-pay | Admitting: Family Medicine

## 2019-05-31 ENCOUNTER — Ambulatory Visit: Payer: Self-pay | Admitting: Pharmacist

## 2019-05-31 ENCOUNTER — Other Ambulatory Visit: Payer: Self-pay

## 2019-05-31 VITALS — BP 168/104 | HR 68 | Temp 98.3°F | Ht 71.0 in | Wt 203.4 lb

## 2019-05-31 DIAGNOSIS — I10 Essential (primary) hypertension: Secondary | ICD-10-CM

## 2019-05-31 DIAGNOSIS — Z09 Encounter for follow-up examination after completed treatment for conditions other than malignant neoplasm: Secondary | ICD-10-CM

## 2019-05-31 DIAGNOSIS — R112 Nausea with vomiting, unspecified: Secondary | ICD-10-CM

## 2019-05-31 DIAGNOSIS — R42 Dizziness and giddiness: Secondary | ICD-10-CM

## 2019-05-31 DIAGNOSIS — R002 Palpitations: Secondary | ICD-10-CM

## 2019-05-31 DIAGNOSIS — R110 Nausea: Secondary | ICD-10-CM

## 2019-05-31 DIAGNOSIS — F419 Anxiety disorder, unspecified: Secondary | ICD-10-CM

## 2019-05-31 MED ORDER — ONDANSETRON 4 MG PO TBDP: 4.0000 mg | ORAL_TABLET | Freq: Three times a day (TID) | ORAL | 2 refills | Status: DC | PRN

## 2019-05-31 MED FILL — ONDANSETRON HCL 4 MG TABLET: 4 | 30 days supply | Qty: 30 | Fill #0

## 2019-05-31 NOTE — Progress Notes (Signed)
Patient [facility] Follow Up   Subjective:  Patient ID: Stanley [name], male    DOB: [birth year]  Age: 43 y.o. MRN: [id number]  CC:  Chief Complaint  Patient presents with   Follow-up    follow up for htn, no other concerns     HPI Stanley [name] is a 43 year old male who presents for Follow Up today.   Past Medical History:  Diagnosis Date   Anxiety    CKD (chronic kidney disease), stage IV ([facility])    Depression    Dyslexia    Heart palpitations    High cholesterol    Hypertension    Hypertensive urgency    Stroke ([place]) 06/03/2017   "minor; completely recovered today" (06/13/2017)   Tobacco abuse    Current Status: Since his last office visit, he has had a visit to Urgent Care for Nausea and Vomiting. He is doing well with no complaints. Although his blood pressure readings are elevated today, her reports that home blood pressure readings are normal range. He is not able to quote home blood pressure readings. He states that she is taking all prescribed blood pressure medications daily as directed. He denies visual changes, chest pain, cough, shortness of breath, heart palpitations, and falls. He has occasional headaches and dizziness with position changes. Denies severe headaches, confusion, seizures, double vision, and blurred vision, nausea and vomiting. His anxiety is mild today. He denies suicidal ideations, homicidal ideations, or auditory hallucinations.   He denies fevers, chills, fatigue, recent infections, weight loss, and night sweats. No reports of GI problems such as diarrhea, and constipation. He has no reports of blood in stools, dysuria and hematuria. He denies pain today.   Past Surgical History:  Procedure Laterality Date   NO PAST SURGERIES      Family History  Problem Relation Age of Onset   Hypertension Mother    Hypertension Brother    Diabetes type II Brother     Social History    Socioeconomic History   Marital status: Married    Spouse name: Not on file   Number of children: Not on file   Years of education: Not on file   Highest education level: Not on file  Occupational History   Not on file  Social Needs   Financial resource strain: Not on file   Food insecurity    Worry: Not on file    Inability: Not on file   Transportation needs    Medical: Not on file    Non-medical: Not on file  Tobacco Use   Smoking status: Current Every Day Smoker    Packs/day: 1.00    Years: 30.00    Pack years: 30.00    Types: Cigarettes   Smokeless tobacco: Never Used  Substance and Sexual Activity   Alcohol use: No   Drug use: Yes    Types: Marijuana    Comment: 06/13/2017 "not often"   Sexual activity: Not Currently  Lifestyle   Physical activity    Days per week: Not on file    Minutes per session: Not on file   Stress: Not on file  Relationships   Social connections    Talks on phone: Not on file    Gets together: Not on file    Attends religious service: Not on file    Active member of club or organization: Not on file    Attends meetings of clubs  or organizations: Not on file    Relationship status: Not on file   Intimate partner violence    Fear of current or ex partner: Not on file    Emotionally abused: Not on file    Physically abused: Not on file    Forced sexual activity: Not on file  Other Topics Concern   Not on file  Social History Narrative   Not on file    Outpatient Medications Prior to Visit  Medication Sig Dispense Refill   acetaminophen (TYLENOL) 500 MG tablet Take 1,000 mg by mouth as needed for mild pain.     albuterol (PROVENTIL HFA;VENTOLIN HFA) 108 (90 Base) MCG/ACT inhaler Inhale 2 puffs into the lungs every 4 (four) hours as needed for wheezing or shortness of breath (cough, shortness of breath or wheezing.). 1 Inhaler 1   amLODipine (NORVASC) 10 MG tablet Take 1 tablet (10 mg total) by mouth daily. 30  tablet 11   aspirin EC 81 MG tablet Take 81 mg by mouth daily.     atorvastatin (LIPITOR) 40 MG tablet Take 1 tablet (40 mg total) by mouth daily at 6 PM. 90 tablet 3   busPIRone (BUSPAR) 5 MG tablet Take 1 tablet (5 mg total) by mouth 2 (two) times daily. 60 tablet 1   cloNIDine (CATAPRES) 0.3 MG tablet TAKE 1 TABLET (0.3 MG TOTAL) BY MOUTH 3 (THREE) TIMES DAILY. 270 tablet 1   doxazosin (CARDURA) 8 MG tablet Take 1 tablet (8 mg total) by mouth daily. 30 tablet 11   hydrALAZINE (APRESOLINE) 100 MG tablet TAKE 1 TABLET (100 MG TOTAL) BY MOUTH 3 (THREE) TIMES DAILY. 270 tablet 1   labetalol (NORMODYNE) 200 MG tablet TAKE 2 TABLETS (400 MG TOTAL) BY MOUTH 3 (THREE) TIMES DAILY. 540 tablet 1   metoprolol succinate (TOPROL-XL) 50 MG 24 hr tablet Take 1 tablet (50 mg total) by mouth daily. Take with or immediately following a meal. 90 tablet 3   Multiple Vitamin (MULTIVITAMIN WITH MINERALS) TABS tablet Take 1 tablet by mouth daily as needed.     nicotine (NICODERM CQ - DOSED IN MG/24 HOURS) 21 mg/24hr patch Place 1 patch (21 mg total) onto the skin daily. Place 1 patch onto the skin daily for 6 weeks. Call [phone] for next prescription 28 patch 1   nitroGLYCERIN (NITROSTAT) 0.4 MG SL tablet Place 1 tablet (0.4 mg total) under the tongue every 5 (five) minutes as needed for chest pain. 50 tablet 3   Potassium Gluconate 595 MG CAPS Take 1 capsule by mouth daily.     PSYLLIUM PO Take 1 tablet by mouth daily.     traZODone (DESYREL) 100 MG tablet TAKE 1 TABLET (100 MG TOTAL) BY MOUTH AT BEDTIME AS NEEDED FOR SLEEP. 30 tablet 3   ondansetron (ZOFRAN ODT) 4 MG disintegrating tablet Take 1 tablet (4 mg total) by mouth every 8 (eight) hours as needed for nausea or vomiting. 20 tablet 0   furosemide (LASIX) 20 MG tablet Take 1 tablet (20 mg total) by mouth daily as needed for up to 30 days. 30 tablet 2   nicotine polacrilex (NICORETTE) 2 MG gum Take 1 each (2 mg total) by mouth as needed for  smoking cessation. (Patient not taking: Reported on 05/31/2019) 100 tablet 0   No facility-administered medications prior to visit.     Allergies  Allergen Reactions   Lactose Intolerance (Gi) Nausea And Vomiting   Other Swelling    All beans   Isosorbide  headaches   Nitroglycerin Anxiety and Other (See Comments)    Pain and feels like he is on fire    ROS Review of Systems  Constitutional: Positive for fatigue.  HENT: Negative.   Eyes: Negative.   Respiratory: Negative.   Cardiovascular: Negative.   Gastrointestinal: Positive for nausea (occasional ).  Endocrine: Negative.   Genitourinary: Negative.   Musculoskeletal: Negative.   Skin: Negative.   Allergic/Immunologic: Negative.   Neurological: Positive for dizziness (frequent) and headaches (occasional ).  Hematological: Negative.   Psychiatric/Behavioral: Negative.       Objective:    Physical Exam  Constitutional: He is oriented to person, place, and time. He appears well-developed and well-nourished.  HENT:  Head: Normocephalic and atraumatic.  Eyes: Conjunctivae are normal.  Neck: Normal range of motion. Neck supple.  Cardiovascular: Normal rate, regular rhythm, normal heart sounds and intact distal pulses.  Pulmonary/Chest: Effort normal and breath sounds normal.  Abdominal: Soft. Bowel sounds are normal.  Musculoskeletal: Normal range of motion.  Neurological: He is alert and oriented to person, place, and time. He has normal reflexes.  Skin: Skin is warm and dry.  Psychiatric: He has a normal mood and affect. His behavior is normal. Judgment and thought content normal.  Nursing note and vitals reviewed.   BP (!) 168/104    Pulse 68    Temp 98.3 F (36.8 C)    Ht 5\' 11"  (1.803 m)    Wt 203 lb 6.4 oz (92.3 kg)    BMI 28.37 kg/m  Wt Readings from Last 3 Encounters:  05/31/19 203 lb 6.4 oz (92.3 kg)  04/11/19 210 lb (95.3 kg)  02/20/19 218 lb (98.9 kg)    Health Maintenance Due  Topic Date  Due   INFLUENZA VACCINE  05/04/2019    There are no preventive care reminders to display for this patient.  Lab Results  Component Value Date   TSH 1.310 03/01/2018   Lab Results  Component Value Date   WBC 5.1 05/18/2018   HGB 13.0 05/18/2018   HCT 41.0 05/18/2018   MCV 88.9 05/18/2018   PLT 263 05/18/2018   Lab Results  Component Value Date   NA 141 04/24/2019   K 4.9 04/24/2019   CO2 17 (L) 04/24/2019   GLUCOSE 86 04/24/2019   BUN 55 (H) 04/24/2019   CREATININE 7.21 (H) 04/24/2019   BILITOT <0.2 04/24/2019   ALKPHOS 90 04/24/2019   AST 13 04/24/2019   ALT 8 04/24/2019   PROT 6.7 04/24/2019   ALBUMIN 4.5 04/24/2019   CALCIUM 9.0 04/24/2019   ANIONGAP 9 05/18/2018   Lab Results  Component Value Date   CHOL 96 (L) 03/01/2018   Lab Results  Component Value Date   HDL 40 03/01/2018   Lab Results  Component Value Date   LDLCALC 44 03/01/2018   Lab Results  Component Value Date   TRIG 62 03/01/2018   Lab Results  Component Value Date   CHOLHDL 2.4 03/01/2018   Lab Results  Component Value Date   HGBA1C 4.9 03/01/2018      Assessment & Plan:   1. Hospital discharge follow-up  2. Non-intractable vomiting with nausea, unspecified vomiting type Stable today.   3. Nausea - ondansetron (ZOFRAN ODT) 4 MG disintegrating tablet; Take 1 tablet (4 mg total) by mouth every 8 (eight) hours as needed for nausea or vomiting.  Dispense: 30 tablet; Refill: 2  4. Uncontrolled hypertension Blood pressures are elevated today. Clonidine 0.2 mg given to  patient in office and blood pressures remain elevated. We referred him to ED via ambulance at this time. Patient refused and signed AMA form at discharge. He denies severe headaches, confusion, seizures, double vision, and blurred vision, nausea and vomiting. He will report to ED if he experiences these symptoms. Patient verbalized understanding. Patient is encouraged to keep a blood pressure diary obtaining blood  pressure readings in the mornings and evenings, and bring with him to next office visit.  - POCT Urinalysis Dipstick  5. Heart palpitations Stable. No signs or symptoms reported.   6. Dizziness Probable r/t hypertensive medications. He will ambulate slowly when changing positions.   7. Anxiety Stable today.   8. Follow up He will follow up in 3 months.   Meds ordered this encounter  Medications   ondansetron (ZOFRAN ODT) 4 MG disintegrating tablet    Sig: Take 1 tablet (4 mg total) by mouth every 8 (eight) hours as needed for nausea or vomiting.    Dispense:  30 tablet    Refill:  2    Orders Placed This Encounter  Procedures   POCT Urinalysis Dipstick    Referral Orders  No referral(s) requested today    Kathe Becton,  MSN, FNP-BC [facility] [address] [place], [place] [address] [phone]- fax  Problem List Items Addressed This Visit      Cardiovascular and Mediastinum   Uncontrolled hypertension   Relevant Orders   POCT Urinalysis Dipstick     Other   Anxiety   Heart palpitations    Other Visit Diagnoses    Hospital discharge follow-up    -  Primary   Non-intractable vomiting with nausea, unspecified vomiting type       Nausea       Relevant Medications   ondansetron (ZOFRAN ODT) 4 MG disintegrating tablet   Dizziness       Follow up          Meds ordered this encounter  Medications   ondansetron (ZOFRAN ODT) 4 MG disintegrating tablet    Sig: Take 1 tablet (4 mg total) by mouth every 8 (eight) hours as needed for nausea or vomiting.    Dispense:  30 tablet    Refill:  2    Follow-up: Return in about 3 months (around 08/31/2019).    [name], FNP

## 2019-05-31 NOTE — Progress Notes (Deleted)
Patient ID: Stanley [name]                 DOB: 1976-05-12                      MRN: [id number]     HPI: Stanley [name] is a 43 y.o. male referred by Dr. Marlou Porch to HTN clinic. PMH is significant for hypertensive emergency, LVH, chest pain, CKD stage IV, stroke, HLD, tobacco abuse, and anxiety/depression.  At last visit (04/24/19), doxazosin was increased from 4 mg daily to 8 mg daily and a BMP was obtained. The BMP indicated a significant rise in pt's SCr 3.24 (05/18/18) to 7.21 (04/24/19). Pt missed follow up appt. Labs were routed to nephrologist, however we called his office and were informed that he was discharged from their office due to multiple no shows - has not been seen since 06/2018.   Pt presents today in good spirits. When discussing his blood pressure, he states he feels dizzy and has blurred vision typically when he stands up too quickly. Educated pt that this is likely d/t to orthostatic hypotension and make sure to move slowly when changing from sitting to standing. Patient denies orthostatics being any worse after starting doxazosin.   Of note, patient is on two beta blockers. Due to the limited amount of medication options for patient due to CKD (cannot use ACE/ARB, thiazide diuretics, or aldosterone antagonists) and the lack of bradycardia, will leave as is. However, it is a long-term goal to D/C dual beta blocker agents considering these medictions are likely contributing to pt's complaints of fatigue.  Needs BMET Any NSAIDs, loop diuretics, illicit drug use (cocaine, synthetic cannabinoids ))  Needs nephrologist [facility] doxazosin if needed ***Tolerating dose increase?  ***Assess affordability of medications and Medicaid application  ***Assess smoking cessation (was smoking 1 pack/2days to 1 pack/week) and was using nicotine patch at last appt. Was he able to purchase nicotine gum?  Current HTN meds: amlodipine 10mg  daily, clonidine 0.3mg  TID, furosemide 20mg  prn - taking daily in AM,  hydralazine 100mg  TID, labetalol 400mg  TID, Toprol 50mg  daily, doxazosin 8mg  HS  Previously tried: isosorbide (headaches)  BP goal: <130/3mmHg  Family History: The patient'sfamily history includes Diabetes type II in his brother; Hypertension in his brother and mother.  Social History: Currently smokes 1 PPD, denies alcohol use, occasionally uses marijuana.  Diet: Breakfast - [place] sausage, eggs, oranges. Lunch - sandwich. Eats a lot of vegetables. Dinner - meat, starch, veggie. Only eats beef 2x a month, mostly eats chicken, veal, or [place]. Does not add any salt to food, mom buys foods with < 10% sodium. Caffeine is limited to 1 cup of coffee each day, occasionally sweet tea.  Exercise: Limited - medication makes him very tired/weak.  Home BP readings:   Wt Readings from Last 3 Encounters:  04/11/19 210 lb (95.3 kg)  02/20/19 218 lb (98.9 kg)  12/19/18 211 lb (95.7 kg)   BP Readings from Last 3 Encounters:  04/24/19 (!) 142/89  04/18/19 (!) 145/81  04/11/19 (!) 178/100   Pulse Readings from Last 3 Encounters:  04/24/19 66  04/18/19 67  04/11/19 72    Microalbumin/creatinine ratio: 870.1 (03/01/2018)  Renal function: CrCl cannot be calculated (Patient's most recent lab result is older than the maximum 21 days allowed.).  Past Medical History:  Diagnosis Date  . Anxiety   . CKD (chronic kidney disease), stage IV ([place])   . Depression   . Dyslexia   .  Heart palpitations   . High cholesterol   . Hypertension   . Hypertensive urgency   . Stroke ([place]) 06/03/2017   "minor; completely recovered today" (06/13/2017)  . Tobacco abuse     Current Outpatient Medications on File Prior to Visit  Medication Sig Dispense Refill  . acetaminophen (TYLENOL) 500 MG tablet Take 1,000 mg by mouth as needed for mild pain.    Marland Kitchen albuterol (PROVENTIL HFA;VENTOLIN HFA) 108 (90 Base) MCG/ACT inhaler Inhale 2 puffs into the lungs every 4 (four) hours as needed for wheezing or  shortness of breath (cough, shortness of breath or wheezing.). 1 Inhaler 1  . amLODipine (NORVASC) 10 MG tablet Take 1 tablet (10 mg total) by mouth daily. 30 tablet 11  . aspirin EC 81 MG tablet Take 81 mg by mouth daily.    Marland Kitchen atorvastatin (LIPITOR) 40 MG tablet Take 1 tablet (40 mg total) by mouth daily at 6 PM. 90 tablet 3  . busPIRone (BUSPAR) 5 MG tablet Take 1 tablet (5 mg total) by mouth 2 (two) times daily. 60 tablet 1  . cloNIDine (CATAPRES) 0.3 MG tablet TAKE 1 TABLET (0.3 MG TOTAL) BY MOUTH 3 (THREE) TIMES DAILY. 270 tablet 1  . doxazosin (CARDURA) 8 MG tablet Take 1 tablet (8 mg total) by mouth daily. 30 tablet 11  . furosemide (LASIX) 20 MG tablet Take 1 tablet (20 mg total) by mouth daily as needed for up to 30 days. 30 tablet 2  . hydrALAZINE (APRESOLINE) 100 MG tablet TAKE 1 TABLET (100 MG TOTAL) BY MOUTH 3 (THREE) TIMES DAILY. 270 tablet 1  . labetalol (NORMODYNE) 200 MG tablet TAKE 2 TABLETS (400 MG TOTAL) BY MOUTH 3 (THREE) TIMES DAILY. 540 tablet 1  . metoprolol succinate (TOPROL-XL) 50 MG 24 hr tablet Take 1 tablet (50 mg total) by mouth daily. Take with or immediately following a meal. 90 tablet 3  . Multiple Vitamin (MULTIVITAMIN WITH MINERALS) TABS tablet Take 1 tablet by mouth daily as needed.    . nicotine (NICODERM CQ - DOSED IN MG/24 HOURS) 21 mg/24hr patch Place 1 patch (21 mg total) onto the skin daily. Place 1 patch onto the skin daily for 6 weeks. Call [phone] for next prescription 28 patch 1  . nicotine polacrilex (NICORETTE) 2 MG gum Take 1 each (2 mg total) by mouth as needed for smoking cessation. 100 tablet 0  . nitroGLYCERIN (NITROSTAT) 0.4 MG SL tablet Place 1 tablet (0.4 mg total) under the tongue every 5 (five) minutes as needed for chest pain. 50 tablet 3  . ondansetron (ZOFRAN ODT) 4 MG disintegrating tablet Take 1 tablet (4 mg total) by mouth every 8 (eight) hours as needed for nausea or vomiting. 20 tablet 0  . Potassium Gluconate 595 MG CAPS Take 1  capsule by mouth daily.    . PSYLLIUM PO Take 1 tablet by mouth daily.    . traZODone (DESYREL) 100 MG tablet TAKE 1 TABLET (100 MG TOTAL) BY MOUTH AT BEDTIME AS NEEDED FOR SLEEP. 30 tablet 3   No current facility-administered medications on file prior to visit.     Allergies  Allergen Reactions  . Lactose Intolerance (Gi) Nausea And Vomiting  . Other Swelling    All beans  . Isosorbide     headaches  . Nitroglycerin Anxiety and Other (See Comments)    Pain and feels like he is on fire     Assessment/Plan:  1. Hypertension - BP goal is < 130/80 mmHg.  Consider switching labetalol to carvedilol 6.25 mg twice daily ($4 list on [facility]; more affordable).  3. Smoking Cessation -    Ferrah Panagopoulos E. Jayvien Rowlette, PharmD, BCACP, [place] [id number] [place], [place] [address] Phone: [phone]; Fax: [phone] 05/31/2019 7:31 AM

## 2019-06-03 ENCOUNTER — Other Ambulatory Visit: Payer: Self-pay | Admitting: Family Medicine

## 2019-06-03 MED FILL — FUROSEMIDE 20 MG TABS: 20 | 30 days supply | Qty: 30 | Fill #0

## 2019-06-03 MED FILL — LABETALOL HCL 200 MG TABLET: 200 | 30 days supply | Qty: 180 | Fill #4

## 2019-06-04 ENCOUNTER — Telehealth: Payer: Self-pay

## 2019-06-04 NOTE — Telephone Encounter (Signed)
-----   Message from [name], [place] sent at 06/01/2019  7:36 PM EDT ----- Regarding: "Blood Pressure Readings" Please encourage patient to keep a blood pressure diary obtaining blood pressure readings in the mornings and evenings, and bring with him to next office visit. Thank you.

## 2019-06-04 NOTE — Telephone Encounter (Signed)
Called and l/m on voicemail for patient to call back about his b/p.

## 2019-06-11 ENCOUNTER — Other Ambulatory Visit: Payer: Self-pay | Admitting: Cardiology

## 2019-06-11 MED FILL — traZODone HCL 100 MG TABS: 100 | 30 days supply | Qty: 30 | Fill #1

## 2019-06-11 MED FILL — DOXAZOSIN MESYLATE 8 MG TAB: 8 | 30 days supply | Qty: 30 | Fill #1

## 2019-06-11 MED FILL — cloNIDine HCL 0.3 MG TABS: 0.3 | 30 days supply | Qty: 90 | Fill #0

## 2019-06-12 MED FILL — METOPROLOL SUCCINATE ER 50: 50 | 30 days supply | Qty: 30 | Fill #0

## 2019-06-24 MED FILL — hydrALAZINE HCL 100 MG TABS: 100 | 30 days supply | Qty: 90 | Fill #1

## 2019-07-08 ENCOUNTER — Other Ambulatory Visit: Payer: Self-pay | Admitting: Cardiology

## 2019-07-08 MED FILL — AMLODIPINE BESYLATE 10 MG T: 10 | 30 days supply | Qty: 30 | Fill #3

## 2019-07-08 MED FILL — cloNIDine HCL 0.3 MG TABS: 0.3 | 30 days supply | Qty: 90 | Fill #1

## 2019-07-08 MED FILL — ATORVASTATIN CALCIUM 40 MG: 40 | 30 days supply | Qty: 30 | Fill #0

## 2019-07-08 MED FILL — traZODone HCL 100 MG TABS: 100 | 30 days supply | Qty: 30 | Fill #2

## 2019-07-08 MED FILL — DOXAZOSIN MESYLATE 8 MG TAB: 8 | 30 days supply | Qty: 30 | Fill #2

## 2019-07-08 MED FILL — hydrALAZINE HCL 100 MG TABS: 100 | 30 days supply | Qty: 90 | Fill #1

## 2019-07-09 MED FILL — LABETALOL HCL 200 MG TABLET: 200 | 30 days supply | Qty: 270 | Fill #0

## 2019-07-25 ENCOUNTER — Other Ambulatory Visit: Payer: Self-pay | Admitting: Cardiology

## 2019-07-25 MED FILL — hydrALAZINE HCL 100 MG TABS: 100 | 30 days supply | Qty: 90 | Fill #1

## 2019-07-25 MED FILL — FUROSEMIDE 20 MG TABS: 20 | 30 days supply | Qty: 30 | Fill #1

## 2019-07-25 MED FILL — LABETALOL HCL 200 MG TABLET: 200 | 30 days supply | Qty: 270 | Fill #0

## 2019-08-05 ENCOUNTER — Ambulatory Visit: Payer: Self-pay

## 2019-08-13 MED FILL — traZODone HCL 100 MG TABS: 100 | 30 days supply | Qty: 30 | Fill #3

## 2019-08-13 MED FILL — ?ATORVASTATIN 40MG TABLET: 40 | 30 days supply | Qty: 30 | Fill #1

## 2019-08-23 ENCOUNTER — Inpatient Hospital Stay (HOSPITAL_COMMUNITY)
Admission: EM | Admit: 2019-08-23 | Discharge: 2019-08-27 | DRG: 305 | Discharge status: Against Medical Advice | Payer: Medicaid Other | Attending: Pulmonary Disease | Admitting: Pulmonary Disease

## 2019-08-23 ENCOUNTER — Encounter (HOSPITAL_COMMUNITY): Payer: Self-pay | Admitting: Emergency Medicine

## 2019-08-23 ENCOUNTER — Other Ambulatory Visit: Payer: Self-pay

## 2019-08-23 DIAGNOSIS — Z8673 Personal history of transient ischemic attack (TIA), and cerebral infarction without residual deficits: Secondary | ICD-10-CM

## 2019-08-23 DIAGNOSIS — Z20828 Contact with and (suspected) exposure to other viral communicable diseases: Secondary | ICD-10-CM | POA: Diagnosis present

## 2019-08-23 DIAGNOSIS — Z9119 Patient's noncompliance with other medical treatment and regimen: Secondary | ICD-10-CM | POA: Diagnosis not present

## 2019-08-23 DIAGNOSIS — G4700 Insomnia, unspecified: Secondary | ICD-10-CM | POA: Diagnosis present

## 2019-08-23 DIAGNOSIS — N185 Chronic kidney disease, stage 5: Secondary | ICD-10-CM | POA: Diagnosis present

## 2019-08-23 DIAGNOSIS — D631 Anemia in chronic kidney disease: Secondary | ICD-10-CM | POA: Diagnosis present

## 2019-08-23 DIAGNOSIS — I5032 Chronic diastolic (congestive) heart failure: Secondary | ICD-10-CM | POA: Diagnosis present

## 2019-08-23 DIAGNOSIS — I129 Hypertensive chronic kidney disease with stage 1 through stage 4 chronic kidney disease, or unspecified chronic kidney disease: Secondary | ICD-10-CM | POA: Diagnosis present

## 2019-08-23 DIAGNOSIS — Z79899 Other long term (current) drug therapy: Secondary | ICD-10-CM

## 2019-08-23 DIAGNOSIS — I422 Other hypertrophic cardiomyopathy: Secondary | ICD-10-CM | POA: Diagnosis present

## 2019-08-23 DIAGNOSIS — N2581 Secondary hyperparathyroidism of renal origin: Secondary | ICD-10-CM | POA: Diagnosis present

## 2019-08-23 DIAGNOSIS — I160 Hypertensive urgency: Secondary | ICD-10-CM | POA: Diagnosis present

## 2019-08-23 DIAGNOSIS — F418 Other specified anxiety disorders: Secondary | ICD-10-CM | POA: Diagnosis present

## 2019-08-23 DIAGNOSIS — F17210 Nicotine dependence, cigarettes, uncomplicated: Secondary | ICD-10-CM | POA: Diagnosis present

## 2019-08-23 DIAGNOSIS — I161 Hypertensive emergency: Secondary | ICD-10-CM | POA: Diagnosis present

## 2019-08-23 DIAGNOSIS — R040 Epistaxis: Secondary | ICD-10-CM | POA: Diagnosis present

## 2019-08-23 DIAGNOSIS — E785 Hyperlipidemia, unspecified: Secondary | ICD-10-CM | POA: Diagnosis present

## 2019-08-23 DIAGNOSIS — E7800 Pure hypercholesterolemia, unspecified: Secondary | ICD-10-CM | POA: Diagnosis present

## 2019-08-23 DIAGNOSIS — I701 Atherosclerosis of renal artery: Secondary | ICD-10-CM

## 2019-08-23 DIAGNOSIS — Z7982 Long term (current) use of aspirin: Secondary | ICD-10-CM | POA: Diagnosis not present

## 2019-08-23 DIAGNOSIS — R480 Dyslexia and alexia: Secondary | ICD-10-CM | POA: Diagnosis present

## 2019-08-23 DIAGNOSIS — I132 Hypertensive heart and chronic kidney disease with heart failure and with stage 5 chronic kidney disease, or end stage renal disease: Secondary | ICD-10-CM | POA: Diagnosis present

## 2019-08-23 DIAGNOSIS — E739 Lactose intolerance, unspecified: Secondary | ICD-10-CM | POA: Diagnosis present

## 2019-08-23 DIAGNOSIS — I169 Hypertensive crisis, unspecified: Secondary | ICD-10-CM | POA: Diagnosis present

## 2019-08-23 LAB — CORTISOL: Cortisol, Plasma: 10.6 ug/dL

## 2019-08-23 LAB — CBC
HCT: 36.1 % — ABNORMAL LOW (ref 39.0–52.0)
Hemoglobin: 11.3 g/dL — ABNORMAL LOW (ref 13.0–17.0)
MCH: 29.6 pg (ref 26.0–34.0)
MCHC: 31.3 g/dL (ref 30.0–36.0)
MCV: 94.5 fL (ref 80.0–100.0)
Platelets: 242 10*3/uL (ref 150–400)
RBC: 3.82 MIL/uL — ABNORMAL LOW (ref 4.22–5.81)
RDW: 14.6 % (ref 11.5–15.5)
WBC: 3.7 10*3/uL — ABNORMAL LOW (ref 4.0–10.5)
nRBC: 0 % (ref 0.0–0.2)

## 2019-08-23 LAB — TROPONIN I (HIGH SENSITIVITY)
Troponin I (High Sensitivity): 37 ng/L — ABNORMAL HIGH (ref <18)
Troponin I (High Sensitivity): 39 ng/L — ABNORMAL HIGH (ref <18)

## 2019-08-23 LAB — BASIC METABOLIC PANEL
Anion gap: 13 (ref 5–15)
BUN: 40 mg/dL — ABNORMAL HIGH (ref 6–20)
CO2: 23 mmol/L (ref 22–32)
Calcium: 9.2 mg/dL (ref 8.9–10.3)
Chloride: 103 mmol/L (ref 98–111)
Creatinine, Ser: 4.95 mg/dL — ABNORMAL HIGH (ref 0.61–1.24)
GFR calc Af Amer: 15 mL/min — ABNORMAL LOW (ref >60)
GFR calc non Af Amer: 13 mL/min — ABNORMAL LOW (ref >60)
Glucose, Bld: 88 mg/dL (ref 70–99)
Potassium: 4.1 mmol/L (ref 3.5–5.1)
Sodium: 139 mmol/L (ref 135–145)

## 2019-08-23 LAB — TYPE AND SCREEN
ABO/RH(D): O POS
Antibody Screen: NEGATIVE

## 2019-08-23 LAB — ABO/RH: ABO/RH(D): O POS

## 2019-08-23 LAB — APTT: aPTT: 32 seconds (ref 24–36)

## 2019-08-23 LAB — HIV ANTIBODY (ROUTINE TESTING W REFLEX): HIV Screen 4th Generation wRfx: NONREACTIVE

## 2019-08-23 LAB — PROTIME-INR
INR: 1.1 (ref 0.8–1.2)
Prothrombin Time: 14.3 seconds (ref 11.4–15.2)

## 2019-08-23 LAB — MAGNESIUM: Magnesium: 2.4 mg/dL (ref 1.7–2.4)

## 2019-08-23 LAB — MRSA PCR SCREENING: MRSA by PCR: NEGATIVE

## 2019-08-23 LAB — BRAIN NATRIURETIC PEPTIDE: B Natriuretic Peptide: 776.9 pg/mL — ABNORMAL HIGH (ref 0.0–100.0)

## 2019-08-23 LAB — SARS CORONAVIRUS 2 (TAT 6-24 HRS): SARS Coronavirus 2: NEGATIVE

## 2019-08-23 LAB — PHOSPHORUS: Phosphorus: 3.7 mg/dL (ref 2.5–4.6)

## 2019-08-23 MED ORDER — LORAZEPAM 2 MG/ML IJ SOLN
INTRAMUSCULAR | Status: AC
  Administered 2019-08-23: 2 mg
  Filled 2019-08-23: qty 1

## 2019-08-23 MED ORDER — ORAL CARE MOUTH RINSE
15.0000 mL | Freq: Two times a day (BID) | OROMUCOSAL | Status: DC
  Administered 2019-08-24 (×2): 15 mL via OROMUCOSAL

## 2019-08-23 MED ORDER — CLONIDINE HCL 0.3 MG PO TABS
0.3000 mg | ORAL_TABLET | Freq: Three times a day (TID) | ORAL | Status: DC
  Administered 2019-08-23 – 2019-08-27 (×12): 0.3 mg via ORAL
  Filled 2019-08-23 (×12): qty 1

## 2019-08-23 MED ORDER — PANTOPRAZOLE SODIUM 40 MG IV SOLR
40.0000 mg | Freq: Every day | INTRAVENOUS | Status: DC
  Administered 2019-08-23 – 2019-08-24 (×2): 40 mg via INTRAVENOUS
  Filled 2019-08-23 (×2): qty 40

## 2019-08-23 MED ORDER — TRAZODONE HCL 50 MG PO TABS
100.0000 mg | ORAL_TABLET | Freq: Every evening | ORAL | Status: DC | PRN
  Administered 2019-08-23 – 2019-08-26 (×4): 100 mg via ORAL
  Filled 2019-08-23 (×4): qty 2

## 2019-08-23 MED ORDER — HYDRALAZINE HCL 20 MG/ML IJ SOLN
10.0000 mg | Freq: Once | INTRAMUSCULAR | Status: AC
  Administered 2019-08-23: 10 mg via INTRAVENOUS
  Filled 2019-08-23: qty 1

## 2019-08-23 MED ORDER — ACETAMINOPHEN 325 MG PO TABS
650.0000 mg | ORAL_TABLET | ORAL | Status: DC | PRN
  Administered 2019-08-24 (×2): 650 mg via ORAL
  Filled 2019-08-23 (×2): qty 2

## 2019-08-23 MED ORDER — ONDANSETRON HCL 4 MG/2ML IJ SOLN: 4.0000 mg | Freq: Four times a day (QID) | INTRAMUSCULAR | Status: DC | PRN

## 2019-08-23 MED ORDER — LACTATED RINGERS IV SOLN
INTRAVENOUS | Status: DC
  Administered 2019-08-23: 18:00:00 via INTRAVENOUS

## 2019-08-23 MED ORDER — AMLODIPINE BESYLATE 10 MG PO TABS
10.0000 mg | ORAL_TABLET | Freq: Every day | ORAL | Status: DC
  Administered 2019-08-24 – 2019-08-27 (×4): 10 mg via ORAL
  Filled 2019-08-23 (×4): qty 1

## 2019-08-23 MED ORDER — CHLORHEXIDINE GLUCONATE CLOTH 2 % EX PADS
6.0000 | MEDICATED_PAD | Freq: Every day | CUTANEOUS | Status: DC
  Administered 2019-08-24 – 2019-08-27 (×4): 6 via TOPICAL

## 2019-08-23 MED ORDER — CHLORHEXIDINE GLUCONATE 0.12 % MT SOLN
15.0000 mL | Freq: Two times a day (BID) | OROMUCOSAL | Status: DC
  Administered 2019-08-24 (×2): 15 mL via OROMUCOSAL
  Filled 2019-08-23 (×3): qty 15

## 2019-08-23 MED ORDER — LABETALOL HCL 5 MG/ML IV SOLN
10.0000 mg | Freq: Once | INTRAVENOUS | Status: AC
  Administered 2019-08-23: 10 mg via INTRAVENOUS
  Filled 2019-08-23: qty 4

## 2019-08-23 MED ORDER — OXYMETAZOLINE HCL 0.05 % NA SOLN
1.0000 | Freq: Once | NASAL | Status: AC
  Administered 2019-08-23: 13:00:00 1 via NASAL
  Filled 2019-08-23: qty 30

## 2019-08-23 MED ORDER — CLEVIDIPINE BUTYRATE 0.5 MG/ML IV EMUL
0.0000 mg/h | INTRAVENOUS | Status: DC
  Administered 2019-08-23 (×2): 13 mg/h via INTRAVENOUS
  Administered 2019-08-23: 12 mg/h via INTRAVENOUS
  Administered 2019-08-23: 11 mg/h via INTRAVENOUS
  Administered 2019-08-23: 3 mg/h via INTRAVENOUS
  Administered 2019-08-23: 9 mg/h via INTRAVENOUS
  Administered 2019-08-23: 13 mg/h via INTRAVENOUS
  Administered 2019-08-23: 6 mg/h via INTRAVENOUS
  Administered 2019-08-23: 4 mg/h via INTRAVENOUS
  Administered 2019-08-23: 8 mg/h via INTRAVENOUS
  Administered 2019-08-23: 10 mg/h via INTRAVENOUS
  Administered 2019-08-23: 7 mg/h via INTRAVENOUS
  Administered 2019-08-23: 13 mg/h via INTRAVENOUS
  Administered 2019-08-23: 5 mg/h via INTRAVENOUS
  Administered 2019-08-23: 2 mg/h via INTRAVENOUS
  Administered 2019-08-23: 1 mg/h via INTRAVENOUS
  Administered 2019-08-24: 21 mg/h via INTRAVENOUS
  Administered 2019-08-24: 18 mg/h via INTRAVENOUS
  Administered 2019-08-24: 14 mg/h via INTRAVENOUS
  Administered 2019-08-24: 21 mg/h via INTRAVENOUS
  Administered 2019-08-24: 5 mg/h via INTRAVENOUS
  Administered 2019-08-24: 16 mg/h via INTRAVENOUS
  Administered 2019-08-25 – 2019-08-26 (×3): 3 mg/h via INTRAVENOUS
  Administered 2019-08-26: 2 mg/h via INTRAVENOUS
  Filled 2019-08-23 (×3): qty 50
  Filled 2019-08-23 (×2): qty 100
  Filled 2019-08-23: qty 50
  Filled 2019-08-23 (×2): qty 100
  Filled 2019-08-23: qty 50
  Filled 2019-08-23: qty 100
  Filled 2019-08-23 (×4): qty 50
  Filled 2019-08-23 (×3): qty 100
  Filled 2019-08-23: qty 50
  Filled 2019-08-23: qty 100

## 2019-08-23 MED FILL — traZODone HCL 100 MG TABS: 100 | 30 days supply | Qty: 30 | Fill #3

## 2019-08-23 NOTE — ED Provider Notes (Addendum)
[facility] EMERGENCY DEPARTMENT Provider Note   CSN: [id number] Arrival date & time: 08/23/19  1152     History   Chief Complaint Chief Complaint  Patient presents with  . Epistaxis    HPI Stanley [name] is a 43 y.o. male with past medical history of stage IV CKD, hypertension shin, stroke, presenting to the emergency department with complaint of epistaxis that began 2 days ago.  He states he has been having intermittent nosebleeds, however it restarted this morning and has been persistent.  He has not been able to stop the bleeding with pressure.  He is noted to be significantly hypertensive on arrival though reports strict compliance with his blood pressure medication.  Patient is prescribed numerous blood pressure medications including metoprolol, labetalol, hydralazine, clonidine, amlodipine.  He states he took all 5 of these medications this morning as directed.  He denies associated headache, vision changes, chest pain, shortness of breath.  He has never had issues with nosebleeds in the past.  He does have some seasonal allergies.  Denies recent trauma to the nose.     The history is provided by the patient.    Past Medical History:  Diagnosis Date  . Anxiety   . CKD (chronic kidney disease), stage IV ([place])   . Depression   . Dyslexia   . Heart palpitations   . High cholesterol   . Hypertension   . Hypertensive urgency   . Stroke ([place]) 06/03/2017   "minor; completely recovered today" (06/13/2017)  . Tobacco abuse     Patient Active Problem List   Diagnosis Date Noted  . Heart palpitations 02/23/2019  . Anxiety 12/19/2018  . Acute pain of right knee 12/19/2018  . Inflammation of joint of right knee 12/19/2018  . Swollen testicle 12/19/2018  . Syncope and collapse   . Transient vision disturbance of left eye   . Hydrocele   . Hypertensive urgency 06/12/2017  . CKD stage 4 secondary to hypertension ([place]) 03/01/2017  . Anemia, chronic disease  03/01/2017  . Hyperparathyroidism, secondary renal ([place]) 03/01/2017  . Hypertensive emergency 09/26/2016  . Tobacco abuse 09/26/2016  . Uncontrolled hypertension 09/26/2016  . AKI (acute kidney injury) ([place]) 09/26/2016    Past Surgical History:  Procedure Laterality Date  . NO PAST SURGERIES          Home Medications    Prior to Admission medications   Medication Sig Start Date End Date Taking? Authorizing Provider  acetaminophen (TYLENOL) 500 MG tablet Take 1,000 mg by mouth as needed for mild pain.    [provider]  albuterol (PROVENTIL HFA;VENTOLIN HFA) 108 (90 Base) MCG/ACT inhaler Inhale 2 puffs into the lungs every 4 (four) hours as needed for wheezing or shortness of breath (cough, shortness of breath or wheezing.). 11/24/17   Scot Jun, FNP  amLODipine (NORVASC) 10 MG tablet Take 1 tablet (10 mg total) by mouth daily. 03/11/19   Jerline Pain, MD  aspirin EC 81 MG tablet Take 81 mg by mouth daily.    [provider]  atorvastatin (LIPITOR) 40 MG tablet TAKE 1 TABLET (40 MG TOTAL) BY MOUTH DAILY AT 6 PM. 07/08/19   Jerline Pain, MD  busPIRone (BUSPAR) 5 MG tablet Take 1 tablet (5 mg total) by mouth 2 (two) times daily. 12/19/18   [name], FNP  cloNIDine (CATAPRES) 0.3 MG tablet TAKE 1 TABLET (0.3 MG TOTAL) BY MOUTH 3 (THREE) TIMES DAILY. 05/28/19   Candee Furbish  C, MD  doxazosin (CARDURA) 8 MG tablet Take 1 tablet (8 mg total) by mouth daily. 04/24/19   Jerline Pain, MD  furosemide (LASIX) 20 MG tablet TAKE 1 TABLET (20 MG TOTAL) BY MOUTH DAILY AS NEEDED FOR UP TO 30 DAYS. 06/03/19 08/23/19  [name], FNP  hydrALAZINE (APRESOLINE) 100 MG tablet TAKE 1 TABLET (100 MG TOTAL) BY MOUTH 3 (THREE) TIMES DAILY. 05/28/19   Jerline Pain, MD  labetalol (NORMODYNE) 200 MG tablet TAKE 2 TABLETS (400 MG TOTAL) BY MOUTH 3 (THREE) TIMES DAILY. 12/31/18   Jerline Pain, MD  metoprolol succinate (TOPROL-XL) 50 MG 24 hr tablet TAKE 1 TABLET (50 MG  TOTAL) BY MOUTH DAILY. TAKE WITH OR IMMEDIATELY FOLLOWING A MEAL. 06/12/19   Jerline Pain, MD  Multiple Vitamin (MULTIVITAMIN WITH MINERALS) TABS tablet Take 1 tablet by mouth daily as needed.    [provider]  nicotine (NICODERM CQ - DOSED IN MG/24 HOURS) 21 mg/24hr patch Place 1 patch (21 mg total) onto the skin daily. Place 1 patch onto the skin daily for 6 weeks. Call [phone] for next prescription 03/25/19   Jerline Pain, MD  nicotine polacrilex (NICORETTE) 2 MG gum Take 1 each (2 mg total) by mouth as needed for smoking cessation. Patient not taking: Reported on 05/31/2019 03/25/19   Jerline Pain, MD  nitroGLYCERIN (NITROSTAT) 0.4 MG SL tablet Place 1 tablet (0.4 mg total) under the tongue every 5 (five) minutes as needed for chest pain. 02/20/19   [name], FNP  ondansetron (ZOFRAN ODT) 4 MG disintegrating tablet Take 1 tablet (4 mg total) by mouth every 8 (eight) hours as needed for nausea or vomiting. 05/31/19   [name], FNP  Potassium Gluconate 595 MG CAPS Take 1 capsule by mouth daily.    [provider]  PSYLLIUM PO Take 1 tablet by mouth daily.    [provider]  traZODone (DESYREL) 100 MG tablet TAKE 1 TABLET (100 MG TOTAL) BY MOUTH AT BEDTIME AS NEEDED FOR SLEEP. 05/01/19   [name], FNP    Family History Family History  Problem Relation Age of Onset  . Hypertension Mother   . Hypertension Brother   . Diabetes type II Brother     Social History Social History   Tobacco Use  . Smoking status: Current Every Day Smoker    Packs/day: 1.00    Years: 30.00    Pack years: 30.00    Types: Cigarettes  . Smokeless tobacco: Never Used  Substance Use Topics  . Alcohol use: No  . Drug use: Yes    Types: Marijuana    Comment: 06/13/2017 "not often"     Allergies   Lactose intolerance (gi), Other, Isosorbide, and Nitroglycerin   Review of Systems Review of Systems  HENT: Positive for nosebleeds.   Eyes: Negative  for visual disturbance.  Cardiovascular: Negative for chest pain and palpitations.  Neurological: Negative for headaches.  All other systems reviewed and are negative.    Physical Exam Updated Vital Signs BP (!) 225/115 (BP Location: Left Arm)   Pulse (!) 58   Temp 97.9 F (36.6 C) (Oral)   Resp 16   SpO2 96%   Physical Exam Vitals signs and nursing note reviewed.  Constitutional:      Appearance: He is well-developed.  HENT:     Head: Normocephalic and atraumatic.     Nose:     Right Nostril: Epistaxis present.  Left Nostril: Epistaxis present.     Comments: There is active bleeding present to bilateral nares, left worse than right.  Dark red blood clots are present as well.  No obvious anterior septal source of bleed. Eyes:     Conjunctiva/sclera: Conjunctivae normal.  Cardiovascular:     Rate and Rhythm: Normal rate and regular rhythm.  Pulmonary:     Effort: Pulmonary effort is normal. No respiratory distress.     Breath sounds: Normal breath sounds.  Abdominal:     Palpations: Abdomen is soft.  Skin:    General: Skin is warm.  Neurological:     Mental Status: He is alert.  Psychiatric:        Behavior: Behavior normal.      ED Treatments / Results  Labs (all labs ordered are listed, but only abnormal results are displayed) Labs Reviewed  CBC - Abnormal; Notable for the following components:      Result Value   WBC 3.7 (*)    RBC 3.82 (*)    Hemoglobin 11.3 (*)    HCT 36.1 (*)    All other components within normal limits  BASIC METABOLIC PANEL - Abnormal; Notable for the following components:   BUN 40 (*)    Creatinine, Ser 4.95 (*)    GFR calc non Af Amer 13 (*)    GFR calc Af Amer 15 (*)    All other components within normal limits  SARS CORONAVIRUS 2 (TAT 6-24 HRS)    EKG None  Radiology No results found.  Procedures .Epistaxis Management  Date/Time: 08/23/2019 3:04 PM Performed by: Taylr Meuth, [place] N, PA-C Authorized by: Alonda Weaber,  [place], PA-C   Consent:    Consent obtained:  Verbal   Consent given by:  Patient   Risks discussed:  Pain, nasal injury and infection   Alternatives discussed:  No treatment Anesthesia (see MAR for exact dosages):    Anesthesia method:  None Procedure details:    Treatment site:  L septum   Treatment method:  Nasal balloon   Treatment complexity:  Limited   Treatment episode: recurring   Post-procedure details:    Assessment:  Bleeding decreased   Patient tolerance of procedure:  Tolerated well, no immediate complications Comments:     4.5 rapid rhino placed in left nare, inflated with 4cc air .Critical Care Performed by: Allison Silva, [place] N, PA-C Authorized by: Shawnetta Lein, [place], PA-C   Critical care provider statement:    Critical care time (minutes):  45   Critical care time was exclusive of:  Separately billable procedures and treating other patients and teaching time   Critical care was necessary to treat or prevent imminent or life-threatening deterioration of the following conditions:  Circulatory failure (hypertensive emergency)   Critical care was time spent personally by me on the following activities:  Discussions with consultants, evaluation of patient's response to treatment, examination of patient, ordering and performing treatments and interventions, ordering and review of laboratory studies, ordering and review of radiographic studies, pulse oximetry, re-evaluation of patient's condition, obtaining history from patient or surrogate and review of old charts   I assumed direction of critical care for this patient from another provider in my specialty: no   .Epistaxis Management  Date/Time: 08/23/2019 6:48 PM Performed by: Caedan Sumler, [place], PA-C Authorized by: Alden Bensinger, [place] N, PA-C   Consent:    Consent obtained:  Verbal   Consent given by:  Patient   Risks discussed:  Pain   Alternatives discussed:  No treatment Anesthesia (see MAR for exact dosages):     Anesthesia method:  None Procedure details:    Treatment site:  L posterior   Treatment method:  Merocel sponge   Treatment complexity:  Limited   Treatment episode: recurring   Post-procedure details:    Assessment:  Bleeding decreased   Patient tolerance of procedure:  Tolerated well, no immediate complications Comments:     Bacitracin applied to end of sponge prior to placement. Pt tolerated well.    (including critical care time)  Medications Ordered in ED Medications  oxymetazoline (AFRIN) 0.05 % nasal spray 1 spray (1 spray Each Nare Given 08/23/19 1316)  hydrALAZINE (APRESOLINE) injection 10 mg (10 mg Intravenous Given 08/23/19 1352)  labetalol (NORMODYNE) injection 10 mg (10 mg Intravenous Given 08/23/19 1442)     Initial Impression / Assessment and Plan / ED Course  I have reviewed the triage vital signs and the nursing notes.  Pertinent labs & imaging results that were available during my care of the patient were reviewed by me and considered in my medical decision making (see chart for details).  Clinical Course as of Aug 22 1846  [facility] Aug 23, 2019  1413 Pt re-evaluated, BP remains elevated. Epistaxis slightly improved though still bleeding in left nare.   [JR]  [id number] Critical care to admit patient and start patient on Cleviprex drip.   [JR]  69 Dr. Janace Hoard ENT recommending merocel nasal packing. Recommends no other interventions at this time until BP controlled.    [JR]    Clinical Course User Index [JR] Halie Gass, [place] N, PA-C       Patient presenting with epistaxis and severe hypertension.  Reports compliance with his blood pressure medications, taking all 5 of his morning medications as directed.  He states he has not missed any doses.  Denies headache, chest pain, palpitations.  Patient's blood pressure is 243/114 on arrival and remains elevated in the 230s throughout ED stay despite multiple IV antihypertensive medications. Patient's labs reveal creatinine of  4.95, unclear of baseline though this may be slightly elevated from baseline which seems to be in the 3s.  He states he has not currently on hemodialysis though there has been talk of starting him.  Hemostasis was attempted initially with Afrin nasal spray and 20 minutes of pressure to the septum without improvement.  Nasal packing was then placed by Dr. Sedonia Small, however patient sneezed and packing fell out.  I then replaced nasal packing and patient's bleeding improved.    After multiple doses of IV antihypertensive medications, BP without improvement. Will admit for hypertensive urgency. Pt agreeable to plan. Dr. Lorin Mercy evaluated patient and recommends critical care consult.  Patient likely requiring drip of hypertensive medications.  Treatment is somewhat limited due to low heart rate and poor renal failure.  Dr.Olalere with critical care accepting admission.  Will start Cleviprex.  Consulted with Dr. Janace Hoard with ENT, recommended Merocel nasal packing which was placed at bedside and provided improvement in patient's bleeding.  Appreciate consult.  Final Clinical Impressions(s) / ED Diagnoses   Final diagnoses:  Epistaxis  Hypertensive emergency    ED Discharge Orders    None       Aaralynn Shepheard, [place] N, PA-C 08/23/19 1538    Solomiya Pascale, [place] N, [place] 08/23/19 1849    Maudie Flakes, MD 08/25/19 1505

## 2019-08-23 NOTE — ED Notes (Signed)
Pt becoming anxious and stating that he needs to leave and that all of this blood is freaking him out. EDP at bedside. Pt's rhino rocket out of nostril. Verbal order for medication given.

## 2019-08-23 NOTE — ED Notes (Signed)
Pt hooked up to the 5 lead, pulse ox and bp cuff

## 2019-08-23 NOTE — ED Notes (Signed)
ED TO INPATIENT HANDOFF REPORT  ED Nurse Name and Phone #:  [phone]  S Name/Age/Gender Stanley [name] 43 y.o. male Room/Bed: 010C/010C  Code Status   Code Status: Full Code  Home/SNF/Other Home Patient oriented to: self, place, time and situation Is this baseline? Yes   Triage Complete: Triage complete  Chief Complaint Nose bleeding  Triage Note Pt states has HTN and his nose has been bleeding with clots nx 2 days  Ems BP was  200/140   Allergies Allergies  Allergen Reactions  . Lactose Intolerance (Gi) Nausea And Vomiting  . Other Swelling    All beans  . Isosorbide     headaches  . Nitroglycerin Anxiety and Other (See Comments)    Pain and feels like he is on fire    Level of Care/Admitting Diagnosis ED Disposition    ED Disposition Condition [place]: [place] [[id number]]  Level of Care: ICU [6]  Covid Evaluation: Person Under Investigation (PUI)  Diagnosis: Hypertensive urgency [id number]  Admitting Physician: Laurin Coder [id number]  Attending Physician: Laurin Coder [id number]  Estimated length of stay: past midnight tomorrow  Certification:: I certify this patient will need inpatient services for at least 2 midnights  PT Class (Do Not Modify): Inpatient [101]  PT Acc Code (Do Not Modify): Private [1]       B Medical/Surgery History Past Medical History:  Diagnosis Date  . Anxiety   . CKD (chronic kidney disease), stage IV ([place])   . Depression   . Dyslexia   . Heart palpitations   . High cholesterol   . Hypertension   . Hypertensive urgency   . Stroke ([place]) 06/03/2017   "minor; completely recovered today" (06/13/2017)  . Tobacco abuse    Past Surgical History:  Procedure Laterality Date  . NO PAST SURGERIES       A IV Location/Drains/Wounds Patient Lines/Drains/Airways Status   Active Line/Drains/Airways    Name:   Placement date:   Placement time:   Site:   Days:   Peripheral IV  08/23/19 Left Hand   08/23/19    0000    Hand   less than 1   Peripheral IV 08/23/19 Right Forearm   08/23/19    1758    Forearm   less than 1          Intake/Output Last 24 hours No intake or output data in the 24 hours ending 08/23/19 1911  Labs/Imaging Results for orders placed or performed during the hospital encounter of 08/23/19 (from the past 48 hour(s))  CBC     Status: Abnormal   Collection Time: 08/23/19 12:11 PM  Result Value Ref Range   WBC 3.7 (L) 4.0 - 10.5 K/uL   RBC 3.82 (L) 4.22 - 5.81 MIL/uL   Hemoglobin 11.3 (L) 13.0 - 17.0 g/dL   HCT 36.1 (L) 39.0 - 52.0 %   MCV 94.5 80.0 - 100.0 fL   MCH 29.6 26.0 - 34.0 pg   MCHC 31.3 30.0 - 36.0 g/dL   RDW 14.6 11.5 - 15.5 %   Platelets 242 150 - 400 K/uL   nRBC 0.0 0.0 - 0.2 %    Comment: Performed at [facility], [place] [address], [place], [place] [id number]  Basic metabolic panel     Status: Abnormal   Collection Time: 08/23/19 12:11 PM  Result Value Ref Range   Sodium 139 135 - 145 mmol/L   Potassium  4.1 3.5 - 5.1 mmol/L   Chloride 103 98 - 111 mmol/L   CO2 23 22 - 32 mmol/L   Glucose, Bld 88 70 - 99 mg/dL   BUN 40 (H) 6 - 20 mg/dL   Creatinine, Ser 4.95 (H) 0.61 - 1.24 mg/dL   Calcium 9.2 8.9 - 10.3 mg/dL   GFR calc non Af Amer 13 (L) >60 mL/min   GFR calc Af Amer 15 (L) >60 mL/min   Anion gap 13 5 - 15    Comment: Performed at [facility] [address] [place], [place] [address]  Troponin I (High Sensitivity)     Status: Abnormal   Collection Time: 08/23/19  6:00 PM  Result Value Ref Range   Troponin I (High Sensitivity) 37 (H) <18 ng/L    Comment: (NOTE) Elevated high sensitivity troponin I (hsTnI) values and significant  changes across serial measurements may suggest ACS but many other  chronic and acute conditions are known to elevate hsTnI results.  Refer to the "Links" section for chest pain algorithms and additional  guidance. Performed at [facility], [place] [address],  [place], [place] [address]   Magnesium     Status: None   Collection Time: 08/23/19  6:00 PM  Result Value Ref Range   Magnesium 2.4 1.7 - 2.4 mg/dL    Comment: Performed at [facility], [place] [address], [place], [place] [address]  Phosphorus     Status: None   Collection Time: 08/23/19  6:00 PM  Result Value Ref Range   Phosphorus 3.7 2.5 - 4.6 mg/dL    Comment: Performed at [facility], [place] [address] [place], [place] [address]  Cortisol     Status: None   Collection Time: 08/23/19  6:00 PM  Result Value Ref Range   Cortisol, Plasma 10.6 ug/dL    Comment: (NOTE) AM    6.7 - 22.6 ug/dL PM   <10.0       ug/dL Performed at [facility] [address], [place], [place] [address]   Type and screen If need to transfuse blood products please use the blood administration order set     Status: None   Collection Time: 08/23/19  6:00 PM  Result Value Ref Range   ABO/RH(D) O POS    Antibody Screen NEG    Sample Expiration      08/26/2019,2359 Performed at [facility], [place] [address] [place], [place] [address]   ABO/Rh     Status: None (Preliminary result)   Collection Time: 08/23/19  6:00 PM  Result Value Ref Range   ABO/RH(D)      O POS Performed at [facility] [address], [place],  [address]    No results found.  Pending Labs Unresulted Labs (From admission, onward)    Start     Ordered   08/24/19 0500  CBC  Tomorrow morning,   R     08/23/19 1736   08/24/19 [id number]  Basic metabolic panel  Tomorrow morning,   R     08/23/19 1736   08/23/19 1833  APTT  ONCE - STAT,   STAT     08/23/19 1833   08/23/19 1833  Protime-INR  ONCE - STAT,   STAT     08/23/19 1833   08/23/19 1734  HIV Antibody (routine testing w rflx)  (HIV Antibody (Routine testing w reflex) panel)  Once,   STAT  08/23/19 1736   08/23/19 1711  Brain natriuretic peptide  Once,   STAT     08/23/19 1711   08/23/19 1515  SARS CORONAVIRUS 2 (TAT 6-24 HRS) Nasopharyngeal  Nasopharyngeal Swab  (Asymptomatic/Tier 3)  Once,   STAT    Question Answer Comment  Is this test for diagnosis or screening Screening   Symptomatic for COVID-19 as defined by CDC No   Hospitalized for COVID-19 No   Admitted to ICU for COVID-19 No   Previously tested for COVID-19 No   Resident in a congregate (group) care setting No   Employed in healthcare setting No      08/23/19 1514          Vitals/Pain Today's Vitals   08/23/19 1850 08/23/19 1855 08/23/19 1900 08/23/19 1909  BP: (!) 165/89 (!) 175/112 (!) 166/103   Pulse: 79 77 77   Resp: 20 17 16    Temp:      TempSrc:      SpO2: 97% 98% 95%   Weight:      Height:      PainSc:    0-No pain    Isolation Precautions No active isolations  Medications Medications  clevidipine (CLEVIPREX) infusion 0.5 mg/mL (13 mg/hr Intravenous New Bag/Given 08/23/19 1910)  lactated ringers infusion ( Intravenous New Bag/Given 08/23/19 1820)  acetaminophen (TYLENOL) tablet 650 mg (has no administration in time range)  ondansetron (ZOFRAN) injection 4 mg (has no administration in time range)  pantoprazole (PROTONIX) injection 40 mg (has no administration in time range)  cloNIDine (CATAPRES) tablet 0.3 mg (0.3 mg Oral Given 08/23/19 1821)  amLODipine (NORVASC) tablet 10 mg (has no administration in time range)  oxymetazoline (AFRIN) 0.05 % nasal spray 1 spray (1 spray Each Nare Given 08/23/19 1316)  hydrALAZINE (APRESOLINE) injection 10 mg (10 mg Intravenous Given 08/23/19 1352)  labetalol (NORMODYNE) injection 10 mg (10 mg Intravenous Given 08/23/19 1442)  LORazepam (ATIVAN) 2 MG/ML injection (2 mg  Given 08/23/19 1624)    Mobility walks Low fall risk   Focused Assessments Cardiac Assessment Handoff:  Cardiac Rhythm: Normal sinus rhythm Lab Results  Component Value Date   TROPONINI 0.04 ([place]) 02/11/2018   No results found for: DDIMER Does the Patient currently have chest pain? No     R Recommendations: See Admitting  Provider Note  Report given to:   Additional Notes: -

## 2019-08-23 NOTE — ED Triage Notes (Signed)
Pt states has HTN and his nose has been bleeding with clots nx 2 days  Ems BP was  200/140

## 2019-08-23 NOTE — Progress Notes (Signed)
eLink Physician-Brief Progress Note Patient Name: Stanley [name] DOB: Nov 29, 1975 MRN: [id number]   Date of Service  08/23/2019  HPI/Events of Note  Patient requests home Trazodone for sleep.   eICU Interventions  Will order: 1. Trazodone 100 mg PO Q HS PRN sleep.      Intervention Category Major Interventions: Other:  Lysle Dingwall 08/23/2019, 9:14 PM

## 2019-08-23 NOTE — ED Provider Notes (Signed)
.  Epistaxis Management  Date/Time: 08/23/2019 3:05 PM Performed by: Maudie Flakes, MD Authorized by: Maudie Flakes, MD   Consent:    Consent obtained:  Verbal   Consent given by:  Patient   Risks discussed:  Bleeding, nasal injury and pain Anesthesia (see MAR for exact dosages):    Anesthesia method:  None Procedure details:    Treatment site:  L anterior   Treatment method:  Merocel sponge   Treatment complexity:  Limited   Treatment episode: initial   Post-procedure details:    Assessment:  Bleeding decreased   Patient tolerance of procedure:  Tolerated well, no immediate complications      Maudie Flakes, MD 08/23/19 1506

## 2019-08-23 NOTE — H&P (Addendum)
NAME:  Stanley [name], MRN:  [id number], DOB:  1976/03/29, LOS: 0 ADMISSION DATE:  08/23/2019, CONSULTATION DATE:  08/23/2019 REFERRING MD: Lorin Mercy- [facility], CHIEF COMPLAINT:  epistaxis  Brief History   43 yo M with poorly controlled HTN presents for epistaxis, noted to have HTN urgency   History of present illness   43 yo M PMH CKD, HTN, HLD, Anxiety, depression presents to [facility] ED with CC epistaxis. Bleeding began 2 days prior to presentation and did not resolve with pressure, but did intermittently spontaneously resolve, then recurred.11/20 when epistaxis recurred it was persistent without periods of resolution or improvement, prompting ED presentation. In ED, patient with SBP > 240. Initially patient without complaints of HA, dizziness, nausea. Patient given 1x labetalol, 1x hydralazine for SBP and nose was packed. SBP continues to be > 200 and nose continues to bleed. For these reasons, PCCM asked to admit. Patient to be started on cleviprex gtt in ED for BP control.   BMP significant for BUN 40, Cr 4.95.   COVID-19 pending Past Medical History  HTN HLD CKD  Anxiety Depression  Hx diastolic heart failure, LV Hypertrophy  Significant Hospital Events   11/20 Nasal packing for epistaxis, SBP > 240 with progressive dizziness, HA. Initially to be admitted to [facility] however now requiring gtt for hemodynamic control. Will admit to PCCM   Consults:  PCCM ENT   Procedures:    Significant Diagnostic Tests:  11/20 Renal US >>> 11/20  Micro Data:  11/20 SARS CoV2>>>   Antimicrobials:    Interim history/subjective:  Very anxious in ED and states he wants to leave  Objective   Blood pressure (!) 249/114, pulse (!) 58, temperature 97.9 F (36.6 C), temperature source Oral, resp. rate 18, height 5\' 11"  (1.803 m), weight 83.9 kg, SpO2 96 %.       No intake or output data in the 24 hours ending 08/23/19 1646 Filed Weights   08/23/19 1554  Weight: 83.9 kg    Examination: General:  WDWN adult M, reclined in bed, anxious appearing HENT: [place]. L nare packing saturated with sanguinous output. Sanguinous post-nasal drip.  Lungs: CTA, symmetrical chest expansion. No accessory muscle use Cardiovascular: RRR s1s2 no rgm. Cap refill < 3 sec Abdomen: soft, round ndnt + bowel sounds  Extremities: Symmetrical bulk and tone. Trace BLE edema. No obvious joint deformity  Neuro: AAOx4 following commands. PERRL GU: defer  Resolved Hospital Problem list     Assessment & Plan:   Hypertensive urgency -presents with SBP 243 -multiple admissions for this in past  Hx HTN, uncontrolled  --home:amlodipine 10mg  daily, clonidine 0.3mg  TID,  hydralazine 100mg  TID, labetalol 400mg  TID, Toprol 50mg  daily, doxazosin 4mg  at bedtime, furosemide 20mg  prn - taking daily in AM  P -goal SBP 160-180 -cleviprex for goal -admit to ICU for close hemodynamic monitoring -Will slowly add back some of patients home meds (as airway protection remains somewhat of concern, am limiting PO meds to extent at this time while epistaxis is addressed)  -ECHO-- if LV dysfunction would add diuretic  -Check BNP, troponins -Check cortisol  -Patient will need optimization of home regimen; there are ongoing concerns/anxiety surrounding ability to afford medications. In setting of CKD, some options are limited   CKD  -not on dialysis -2/2 HTN P -Renal US ordered by hospitalist team  -Labs without acute indication for iHD now -Trend renal indices and electrolytes -Strict I/O   Epistaxis -bleeding began 11/18, was intermittent, now constant -s/p merocel in ED  11/20 -denies facial trauma, hx nosebleeds P -ENT consulted  -AM CBC -Protecting airway at present  -Will hold off chemical VTE ppx in setting of ongoing bleeding   Anxiety -home buspar Sleep disturbance -home trazodone  P -environmental interventions for calm environment   HLD -holding home lipitor   Best practice:  Diet: Ice chips  Pain/Anxiety/Delirium protocol (if indicated): na VAP protocol (if indicated): na DVT prophylaxis: SCD GI prophylaxis: protonix Glucose control: monitor Mobility: BR Code Status: Full Family Communication: Patient updated 11/20  Disposition: Admit to ICU, will admit as PUI as COVID-19 not yet resulted  Labs   CBC: Recent Labs  Lab 08/23/19 1211  WBC 3.7*  HGB 11.3*  HCT 36.1*  MCV 94.5  PLT [id number]    Basic Metabolic Panel: Recent Labs  Lab 08/23/19 1211  NA 139  K 4.1  CL 103  CO2 23  GLUCOSE 88  BUN 40*  CREATININE 4.95*  CALCIUM 9.2   GFR: Estimated Creatinine Clearance: 20.7 mL/min (A) (by C-G formula based on SCr of 4.95 mg/dL (H)). Recent Labs  Lab 08/23/19 1211  WBC 3.7*    Liver Function Tests: No results for input(s): AST, ALT, ALKPHOS, BILITOT, PROT, ALBUMIN in the last 168 hours. No results for input(s): LIPASE, AMYLASE in the last 168 hours. No results for input(s): AMMONIA in the last 168 hours.  ABG    Component Value Date/Time   HCO3 31.0 (H) 10/22/2007 1526   TCO2 33 10/22/2007 1526     Coagulation Profile: No results for input(s): INR, PROTIME in the last 168 hours.  Cardiac Enzymes: No results for input(s): CKTOTAL, CKMB, CKMBINDEX, TROPONINI in the last 168 hours.  HbA1C: Hemoglobin A1C  Date/Time Value Ref Range Status  03/01/2018 02:26 PM 4.9 4.0 - 5.6 % Final  11/24/2017 03:22 PM 5.5  Final   Hgb A1c MFr Bld  Date/Time Value Ref Range Status  09/27/2016 03:01 AM 4.8 4.8 - 5.6 % Final    Comment:    (NOTE)         Pre-diabetes: 5.7 - 6.4         Diabetes: >6.4         Glycemic control for adults with diabetes: <7.0     CBG: No results for input(s): GLUCAP in the last 168 hours.  Review of Systems:   Pertinent positives include: Headache Dizziness Epistaxis  Nausea  Past Medical History  He,  has a past medical history of Anxiety, CKD (chronic kidney disease), stage IV ([place]), Depression, Dyslexia, Heart  palpitations, High cholesterol, Hypertension, Hypertensive urgency, Stroke ([place]) (06/03/2017), and Tobacco abuse.   Surgical History    Past Surgical History:  Procedure Laterality Date  . NO PAST SURGERIES       Social History   reports that he has been smoking cigarettes. He has a 30.00 pack-year smoking history. He has never used smokeless tobacco. He reports current drug use. Drug: Marijuana. He reports that he does not drink alcohol.   Family History   His family history includes Diabetes type II in his brother; Hypertension in his brother and mother.   Allergies Allergies  Allergen Reactions  . Lactose Intolerance (Gi) Nausea And Vomiting  . Other Swelling    All beans  . Isosorbide     headaches  . Nitroglycerin Anxiety and Other (See Comments)    Pain and feels like he is on fire     Home Medications  Prior to Admission medications   Medication Sig  Start Date End Date Taking? Authorizing Provider  acetaminophen (TYLENOL) 500 MG tablet Take 1,000 mg by mouth as needed for mild pain.    [provider]  albuterol (PROVENTIL HFA;VENTOLIN HFA) 108 (90 Base) MCG/ACT inhaler Inhale 2 puffs into the lungs every 4 (four) hours as needed for wheezing or shortness of breath (cough, shortness of breath or wheezing.). 11/24/17   Scot Jun, FNP  amLODipine (NORVASC) 10 MG tablet Take 1 tablet (10 mg total) by mouth daily. 03/11/19   Jerline Pain, MD  aspirin EC 81 MG tablet Take 81 mg by mouth daily.    [provider]  atorvastatin (LIPITOR) 40 MG tablet TAKE 1 TABLET (40 MG TOTAL) BY MOUTH DAILY AT 6 PM. 07/08/19   Jerline Pain, MD  busPIRone (BUSPAR) 5 MG tablet Take 1 tablet (5 mg total) by mouth 2 (two) times daily. 12/19/18   [name], FNP  cloNIDine (CATAPRES) 0.3 MG tablet TAKE 1 TABLET (0.3 MG TOTAL) BY MOUTH 3 (THREE) TIMES DAILY. 05/28/19   Jerline Pain, MD  doxazosin (CARDURA) 8 MG tablet Take 1 tablet (8 mg total) by mouth daily.  04/24/19   Jerline Pain, MD  furosemide (LASIX) 20 MG tablet TAKE 1 TABLET (20 MG TOTAL) BY MOUTH DAILY AS NEEDED FOR UP TO 30 DAYS. 06/03/19 08/23/19  [name], FNP  hydrALAZINE (APRESOLINE) 100 MG tablet TAKE 1 TABLET (100 MG TOTAL) BY MOUTH 3 (THREE) TIMES DAILY. 05/28/19   Jerline Pain, MD  labetalol (NORMODYNE) 200 MG tablet TAKE 2 TABLETS (400 MG TOTAL) BY MOUTH 3 (THREE) TIMES DAILY. 12/31/18   Jerline Pain, MD  metoprolol succinate (TOPROL-XL) 50 MG 24 hr tablet TAKE 1 TABLET (50 MG TOTAL) BY MOUTH DAILY. TAKE WITH OR IMMEDIATELY FOLLOWING A MEAL. 06/12/19   Jerline Pain, MD  Multiple Vitamin (MULTIVITAMIN WITH MINERALS) TABS tablet Take 1 tablet by mouth daily as needed.    [provider]  nicotine (NICODERM CQ - DOSED IN MG/24 HOURS) 21 mg/24hr patch Place 1 patch (21 mg total) onto the skin daily. Place 1 patch onto the skin daily for 6 weeks. Call [phone] for next prescription 03/25/19   Jerline Pain, MD  nicotine polacrilex (NICORETTE) 2 MG gum Take 1 each (2 mg total) by mouth as needed for smoking cessation. Patient not taking: Reported on 05/31/2019 03/25/19   Jerline Pain, MD  nitroGLYCERIN (NITROSTAT) 0.4 MG SL tablet Place 1 tablet (0.4 mg total) under the tongue every 5 (five) minutes as needed for chest pain. 02/20/19   [name], FNP  ondansetron (ZOFRAN ODT) 4 MG disintegrating tablet Take 1 tablet (4 mg total) by mouth every 8 (eight) hours as needed for nausea or vomiting. 05/31/19   [name], FNP  Potassium Gluconate 595 MG CAPS Take 1 capsule by mouth daily.    [provider]  PSYLLIUM PO Take 1 tablet by mouth daily.    [provider]  traZODone (DESYREL) 100 MG tablet TAKE 1 TABLET (100 MG TOTAL) BY MOUTH AT BEDTIME AS NEEDED FOR SLEEP. 05/01/19   [name], FNP     Critical care time: 40 min       Eliseo Gum MSN, AGACNP-BC [employer] [id number] If no answer,  [id number] 08/23/2019, 5:32 PM

## 2019-08-23 NOTE — Consult Note (Addendum)
ER Consult Note   Stanley [name] [id number] DOB: [birth year] DOA: 08/23/2019  PCP: [name], FNP Consultants:  Marlou Porch - cardiology   Chief Complaint: Epistaxis  HPI: Stanley [name] is a 43 y.o. male with medical history significant of tobacco abuse; CVA; HTN; HLD; stage 4 CKD; and anxiety/depression presenting with epistaxis.  Patient has had 2 doses of 10 mg hydralazine; 10 mg IV labetalol; and nasal packing - all without improvement in BP or epistaxis.  He reports not feeling well - he is dizzy, irritable and uncomfortable.   ED Course:  Presented with epistaxis.  Significantly hypertensive.  Reports compliance with MULTIPLE medications.  BP 243/115 here, hasn't gotten much improvement with hydralazine or labetalol.  Nasal packing done.  Creatinine is 4.95 today, ?baseline.  They "have been talking" about HD.  Renal US ordered.   Past Medical History:  Diagnosis Date  . Anxiety   . CKD (chronic kidney disease), stage IV ([place])   . Depression   . Dyslexia   . Heart palpitations   . High cholesterol   . Hypertension   . Hypertensive urgency   . Stroke ([place]) 06/03/2017   "minor; completely recovered today" (06/13/2017)  . Tobacco abuse     Past Surgical History:  Procedure Laterality Date  . NO PAST SURGERIES      Social History   Socioeconomic History  . Marital status: Married    Spouse name: Not on file  . Number of children: Not on file  . Years of education: Not on file  . Highest education level: Not on file  Occupational History  . Not on file  Social Needs  . Financial resource strain: Not on file  . Food insecurity    Worry: Not on file    Inability: Not on file  . Transportation needs    Medical: Not on file    Non-medical: Not on file  Tobacco Use  . Smoking status: Current Every Day Smoker    Packs/day: 1.00    Years: 30.00    Pack years: 30.00    Types: Cigarettes  . Smokeless tobacco: Never Used  Substance and Sexual Activity  . Alcohol  use: No  . Drug use: Yes    Types: Marijuana    Comment: 06/13/2017 "not often"  . Sexual activity: Not Currently  Lifestyle  . Physical activity    Days per week: Not on file    Minutes per session: Not on file  . Stress: Not on file  Relationships  . Social Herbalist on phone: Not on file    Gets together: Not on file    Attends religious service: Not on file    Active member of club or organization: Not on file    Attends meetings of clubs or organizations: Not on file    Relationship status: Not on file  . Intimate partner violence    Fear of current or ex partner: Not on file    Emotionally abused: Not on file    Physically abused: Not on file    Forced sexual activity: Not on file  Other Topics Concern  . Not on file  Social History Narrative  . Not on file    Allergies  Allergen Reactions  . Lactose Intolerance (Gi) Nausea And Vomiting  . Other Swelling    All beans  . Isosorbide     headaches  . Nitroglycerin Anxiety and Other (See Comments)    Pain and  feels like he is on fire    Family History  Problem Relation Age of Onset  . Hypertension Mother   . Hypertension Brother   . Diabetes type II Brother     Prior to Admission medications   Medication Sig Start Date End Date Taking? Authorizing Provider  acetaminophen (TYLENOL) 500 MG tablet Take 1,000 mg by mouth as needed for mild pain.    [provider]  albuterol (PROVENTIL HFA;VENTOLIN HFA) 108 (90 Base) MCG/ACT inhaler Inhale 2 puffs into the lungs every 4 (four) hours as needed for wheezing or shortness of breath (cough, shortness of breath or wheezing.). 11/24/17   Scot Jun, FNP  amLODipine (NORVASC) 10 MG tablet Take 1 tablet (10 mg total) by mouth daily. 03/11/19   Jerline Pain, MD  aspirin EC 81 MG tablet Take 81 mg by mouth daily.    [provider]  atorvastatin (LIPITOR) 40 MG tablet TAKE 1 TABLET (40 MG TOTAL) BY MOUTH DAILY AT 6 PM. 07/08/19   Jerline Pain, MD  busPIRone (BUSPAR) 5 MG tablet Take 1 tablet (5 mg total) by mouth 2 (two) times daily. 12/19/18   [name], FNP  cloNIDine (CATAPRES) 0.3 MG tablet TAKE 1 TABLET (0.3 MG TOTAL) BY MOUTH 3 (THREE) TIMES DAILY. 05/28/19   Jerline Pain, MD  doxazosin (CARDURA) 8 MG tablet Take 1 tablet (8 mg total) by mouth daily. 04/24/19   Jerline Pain, MD  furosemide (LASIX) 20 MG tablet TAKE 1 TABLET (20 MG TOTAL) BY MOUTH DAILY AS NEEDED FOR UP TO 30 DAYS. 06/03/19 08/23/19  [name], FNP  hydrALAZINE (APRESOLINE) 100 MG tablet TAKE 1 TABLET (100 MG TOTAL) BY MOUTH 3 (THREE) TIMES DAILY. 05/28/19   Jerline Pain, MD  labetalol (NORMODYNE) 200 MG tablet TAKE 2 TABLETS (400 MG TOTAL) BY MOUTH 3 (THREE) TIMES DAILY. 12/31/18   Jerline Pain, MD  metoprolol succinate (TOPROL-XL) 50 MG 24 hr tablet TAKE 1 TABLET (50 MG TOTAL) BY MOUTH DAILY. TAKE WITH OR IMMEDIATELY FOLLOWING A MEAL. 06/12/19   Jerline Pain, MD  Multiple Vitamin (MULTIVITAMIN WITH MINERALS) TABS tablet Take 1 tablet by mouth daily as needed.    [provider]  nicotine (NICODERM CQ - DOSED IN MG/24 HOURS) 21 mg/24hr patch Place 1 patch (21 mg total) onto the skin daily. Place 1 patch onto the skin daily for 6 weeks. Call [phone] for next prescription 03/25/19   Jerline Pain, MD  nicotine polacrilex (NICORETTE) 2 MG gum Take 1 each (2 mg total) by mouth as needed for smoking cessation. Patient not taking: Reported on 05/31/2019 03/25/19   Jerline Pain, MD  nitroGLYCERIN (NITROSTAT) 0.4 MG SL tablet Place 1 tablet (0.4 mg total) under the tongue every 5 (five) minutes as needed for chest pain. 02/20/19   [name], FNP  ondansetron (ZOFRAN ODT) 4 MG disintegrating tablet Take 1 tablet (4 mg total) by mouth every 8 (eight) hours as needed for nausea or vomiting. 05/31/19   [name], FNP  Potassium Gluconate 595 MG CAPS Take 1 capsule by mouth daily.    [provider]  PSYLLIUM PO Take 1  tablet by mouth daily.    [provider]  traZODone (DESYREL) 100 MG tablet TAKE 1 TABLET (100 MG TOTAL) BY MOUTH AT BEDTIME AS NEEDED FOR SLEEP. 05/01/19   [name], FNP    Physical Exam: Vitals:   08/23/19 1152 08/23/19  1259 08/23/19 1554 08/23/19 1600  BP: (!) 243/114 (!) 225/115  (!) 249/114  Pulse: 64 (!) 58    Resp: 16 16    Temp: 97.6 F (36.4 C) 97.9 F (36.6 C)    TempSrc: Oral Oral    SpO2: 99% 96%    Weight:   83.9 kg   Height:   5\' 11"  (1.803 m)      General:  Appears uncomfortable, spitting blood into an emesis bag   Radiological Exams on Admission: No results found.  EKG: ordered   Labs on Admission: I have personally reviewed the available labs and imaging studies at the time of the admission.  Pertinent labs:   BUN 40/Creatinine 4.95/GFR 15 WBC 3.7 Hgb 11.3   Assessment/Plan Principal Problem:   Hypertensive crisis Active Problems:   CKD stage 4 secondary to hypertension (HCC)   Acute posterior epistaxis   -The patient has had multiple doses of IV medications to improve uncontrolled HTN without success -Appears to have HTN emergency given SBP 249 and new/worsening symptoms including dizziness -He appears likely to need a drip for improved BP control -EKG ordered -In conjunction with uncontrolled HTN, he also has uncontrolled epistaxis despite attempts at nasal packing while in the ER  -As a result of these 2 ongoing, uncontrolled issues, I have requested PCCM and ENT consults vs. Further ER stabilization for these issues at this time -Please reconsult [facility] should further evaluation be needed once the patient is stabilized.   Karmen Bongo MD [facility]   How to contact the [facility] Attending or Consulting provider [place] or covering provider during after hours [place], for this patient?  1. Check the care team in [facility] and look for a) attending/consulting [facility] provider listed and b) the [facility] team listed 2. Log into  [email or web address] and use [place]'s universal password to access. If you do not have the password, please contact the hospital operator. 3. Locate the [facility] provider you are looking for under [facility] and page to a number that you can be directly reached. 4. If you still have difficulty reaching the provider, please page the [facility] (Director on Call) for the Hospitalists listed on amion for assistance.   08/23/2019, 4:22 PM

## 2019-08-24 ENCOUNTER — Inpatient Hospital Stay (HOSPITAL_COMMUNITY): Payer: Medicaid Other

## 2019-08-24 DIAGNOSIS — I129 Hypertensive chronic kidney disease with stage 1 through stage 4 chronic kidney disease, or unspecified chronic kidney disease: Secondary | ICD-10-CM

## 2019-08-24 DIAGNOSIS — R040 Epistaxis: Secondary | ICD-10-CM

## 2019-08-24 DIAGNOSIS — J810 Acute pulmonary edema: Secondary | ICD-10-CM

## 2019-08-24 DIAGNOSIS — I160 Hypertensive urgency: Secondary | ICD-10-CM

## 2019-08-24 DIAGNOSIS — I169 Hypertensive crisis, unspecified: Secondary | ICD-10-CM

## 2019-08-24 DIAGNOSIS — N184 Chronic kidney disease, stage 4 (severe): Secondary | ICD-10-CM

## 2019-08-24 LAB — BASIC METABOLIC PANEL
Anion gap: 13 (ref 5–15)
BUN: 37 mg/dL — ABNORMAL HIGH (ref 6–20)
CO2: 20 mmol/L — ABNORMAL LOW (ref 22–32)
Calcium: 8.9 mg/dL (ref 8.9–10.3)
Chloride: 106 mmol/L (ref 98–111)
Creatinine, Ser: 4.33 mg/dL — ABNORMAL HIGH (ref 0.61–1.24)
GFR calc Af Amer: 18 mL/min — ABNORMAL LOW (ref >60)
GFR calc non Af Amer: 16 mL/min — ABNORMAL LOW (ref >60)
Glucose, Bld: 86 mg/dL (ref 70–99)
Potassium: 3.8 mmol/L (ref 3.5–5.1)
Sodium: 139 mmol/L (ref 135–145)

## 2019-08-24 LAB — ECHOCARDIOGRAM COMPLETE
Height: 71 in
Weight: 3065.28 oz

## 2019-08-24 LAB — CBC
HCT: 33.4 % — ABNORMAL LOW (ref 39.0–52.0)
Hemoglobin: 11 g/dL — ABNORMAL LOW (ref 13.0–17.0)
MCH: 30 pg (ref 26.0–34.0)
MCHC: 32.9 g/dL (ref 30.0–36.0)
MCV: 91 fL (ref 80.0–100.0)
Platelets: 216 10*3/uL (ref 150–400)
RBC: 3.67 MIL/uL — ABNORMAL LOW (ref 4.22–5.81)
RDW: 14.4 % (ref 11.5–15.5)
WBC: 4.9 10*3/uL (ref 4.0–10.5)

## 2019-08-24 LAB — TROPONIN I (HIGH SENSITIVITY): Troponin I (High Sensitivity): 39 ng/L — ABNORMAL HIGH (ref <18)

## 2019-08-24 MED ORDER — WHITE PETROLATUM EX OINT
TOPICAL_OINTMENT | CUTANEOUS | Status: AC
  Administered 2019-08-24: 19:00:00
  Filled 2019-08-24: qty 28.35

## 2019-08-24 MED ORDER — LABETALOL HCL 200 MG PO TABS
400.0000 mg | ORAL_TABLET | Freq: Three times a day (TID) | ORAL | Status: DC
  Administered 2019-08-24 – 2019-08-26 (×7): 400 mg via ORAL
  Filled 2019-08-24 (×9): qty 2

## 2019-08-24 MED ORDER — HYDRALAZINE HCL 50 MG PO TABS
100.0000 mg | ORAL_TABLET | Freq: Three times a day (TID) | ORAL | Status: DC
  Administered 2019-08-24 – 2019-08-27 (×10): 100 mg via ORAL
  Filled 2019-08-24 (×11): qty 2

## 2019-08-24 MED ORDER — FUROSEMIDE 10 MG/ML IJ SOLN
80.0000 mg | Freq: Three times a day (TID) | INTRAMUSCULAR | Status: AC
  Administered 2019-08-24 (×2): 80 mg via INTRAVENOUS
  Filled 2019-08-24 (×2): qty 8

## 2019-08-24 MED ORDER — DOXAZOSIN MESYLATE 4 MG PO TABS
4.0000 mg | ORAL_TABLET | Freq: Every day | ORAL | Status: DC
  Administered 2019-08-24 – 2019-08-26 (×3): 4 mg via ORAL
  Filled 2019-08-24 (×3): qty 1

## 2019-08-24 NOTE — Progress Notes (Signed)
Renal artery duplex       has been completed. Preliminary results can be found under CV proc through chart review. Jaylenn Altier, BS, RDMS, RVT   

## 2019-08-24 NOTE — Progress Notes (Signed)
Assisted tele visit to patient with family member.  Koston Hennes M, RN  

## 2019-08-24 NOTE — Consult Note (Signed)
Reason for Consult:epistaxis Referring Physician: hospitalist  Stanley [name] is an 43 y.o. male.  HPI: hx of epistaxis and hypertensive crisis. He had pack placed into the left side and has had good control. He also is getting his BP under control. He has no significant hx of epistaxis previous.   Past Medical History:  Diagnosis Date  . Anxiety   . CKD (chronic kidney disease), stage IV ([place])   . Depression   . Dyslexia   . Heart palpitations   . High cholesterol   . Hypertension   . Hypertensive urgency   . Stroke ([place]) 06/03/2017   "minor; completely recovered today" (06/13/2017)  . Tobacco abuse     Past Surgical History:  Procedure Laterality Date  . NO PAST SURGERIES      Family History  Problem Relation Age of Onset  . Hypertension Mother   . Hypertension Brother   . Diabetes type II Brother     Social History:  reports that he has been smoking cigarettes. He has a 30.00 pack-year smoking history. He has never used smokeless tobacco. He reports current drug use. Drug: Marijuana. He reports that he does not drink alcohol.  Allergies:  Allergies  Allergen Reactions  . Lactose Intolerance (Gi) Nausea And Vomiting  . Other Swelling    All beans  . Isosorbide     headaches  . Nitroglycerin Anxiety and Other (See Comments)    Pain and feels like he is on fire    Medications: I have reviewed the patient's current medications.  Results for orders placed or performed during the hospital encounter of 08/23/19 (from the past 48 hour(s))  CBC     Status: Abnormal   Collection Time: 08/23/19 12:11 PM  Result Value Ref Range   WBC 3.7 (L) 4.0 - 10.5 K/uL   RBC 3.82 (L) 4.22 - 5.81 MIL/uL   Hemoglobin 11.3 (L) 13.0 - 17.0 g/dL   HCT 36.1 (L) 39.0 - 52.0 %   MCV 94.5 80.0 - 100.0 fL   MCH 29.6 26.0 - 34.0 pg   MCHC 31.3 30.0 - 36.0 g/dL   RDW 14.6 11.5 - 15.5 %   Platelets 242 150 - 400 K/uL   nRBC 0.0 0.0 - 0.2 %    Comment: Performed at [facility],  [place] [address] [place], [place] [id number]  Basic metabolic panel     Status: Abnormal   Collection Time: 08/23/19 12:11 PM  Result Value Ref Range   Sodium 139 135 - 145 mmol/L   Potassium 4.1 3.5 - 5.1 mmol/L   Chloride 103 98 - 111 mmol/L   CO2 23 22 - 32 mmol/L   Glucose, Bld 88 70 - 99 mg/dL   BUN 40 (H) 6 - 20 mg/dL   Creatinine, Ser 4.95 (H) 0.61 - 1.24 mg/dL   Calcium 9.2 8.9 - 10.3 mg/dL   GFR calc non Af Amer 13 (L) >60 mL/min   GFR calc Af Amer 15 (L) >60 mL/min   Anion gap 13 5 - 15    Comment: Performed at [facility], [place] [address] [place], [place] [address]  SARS CORONAVIRUS 2 (TAT 6-24 HRS) Nasopharyngeal Nasopharyngeal Swab     Status: None   Collection Time: 08/23/19  3:44 PM   Specimen: Nasopharyngeal Swab  Result Value Ref Range   SARS Coronavirus 2 NEGATIVE NEGATIVE    Comment: (NOTE) SARS-CoV-2 target nucleic acids are NOT DETECTED. The SARS-CoV-2 RNA is generally  detectable in upper and lower respiratory specimens during the acute phase of infection. Negative results do not preclude SARS-CoV-2 infection, do not rule out co-infections with other pathogens, and should not be used as the sole basis for treatment or other patient management decisions. Negative results must be combined with clinical observations, patient history, and epidemiological information. The expected result is Negative. Fact Sheet for Patients: SugarRoll.be Fact Sheet for Healthcare Providers: https://www.woods-mathews.com/ This test is not yet approved or cleared by the [place] FDA and  has been authorized for detection and/or diagnosis of SARS-CoV-2 by FDA under an Emergency Use Authorization (EUA). This EUA will remain  in effect (meaning this test can be used) for the duration of the COVID-19 declaration under Section 56 4(b)(1) of the Act, 21 U.S.C. section 360bbb-3(b)(1), unless the authorization is terminated or revoked  sooner. Performed at [facility], [place] [address], [place], [place] [address]   Brain natriuretic peptide     Status: Abnormal   Collection Time: 08/23/19  6:00 PM  Result Value Ref Range   B Natriuretic Peptide 776.9 (H) 0.0 - 100.0 pg/mL    Comment: Performed at [place] [address] [place], [place] [address]  Troponin I (High Sensitivity)     Status: Abnormal   Collection Time: 08/23/19  6:00 PM  Result Value Ref Range   Troponin I (High Sensitivity) 37 (H) <18 ng/L    Comment: (NOTE) Elevated high sensitivity troponin I (hsTnI) values and significant  changes across serial measurements may suggest ACS but many other  chronic and acute conditions are known to elevate hsTnI results.  Refer to the "Links" section for chest pain algorithms and additional  guidance. Performed at [facility], [place] [address], [place], [place] [address]   HIV Antibody (routine testing w rflx)     Status: None   Collection Time: 08/23/19  6:00 PM  Result Value Ref Range   HIV Screen 4th Generation wRfx NON REACTIVE NON REACTIVE    Comment: Performed at [facility], [place] [address] [place], [place] [address]  Magnesium     Status: None   Collection Time: 08/23/19  6:00 PM  Result Value Ref Range   Magnesium 2.4 1.7 - 2.4 mg/dL    Comment: Performed at [facility], [place] [address], [place], [place] [address]  Phosphorus     Status: None   Collection Time: 08/23/19  6:00 PM  Result Value Ref Range   Phosphorus 3.7 2.5 - 4.6 mg/dL    Comment: Performed at [facility], [place] [address] [place], [place] [address]  Cortisol     Status: None   Collection Time: 08/23/19  6:00 PM  Result Value Ref Range   Cortisol, Plasma 10.6 ug/dL    Comment: (NOTE) AM    6.7 - 22.6 ug/dL PM   <10.0       ug/dL Performed at [facility] [address] [place], [place] [address]   Type and screen If need to transfuse blood products please use the blood  administration order set     Status: None   Collection Time: 08/23/19  6:00 PM  Result Value Ref Range   ABO/RH(D) O POS    Antibody Screen NEG    Sample Expiration      08/26/2019,2359 Performed at [facility], [place] [address] [place], [place] [address]   ABO/Rh     Status: None  Collection Time: 08/23/19  6:00 PM  Result Value Ref Range   ABO/RH(D)      O POS Performed at [facility] [address] [place], [place] [address]   APTT     Status: None   Collection Time: 08/23/19  6:54 PM  Result Value Ref Range   aPTT 32 24 - 36 seconds    Comment: Performed at [place] [facility] [address] [place], [place] [address]  Protime-INR     Status: None   Collection Time: 08/23/19  6:54 PM  Result Value Ref Range   Prothrombin Time 14.3 11.4 - 15.2 seconds   INR 1.1 0.8 - 1.2    Comment: (NOTE) INR goal varies based on device and disease states. Performed at [facility], [place] [address], [place], [place] [address]   MRSA PCR Screening     Status: None   Collection Time: 08/23/19  8:08 PM   Specimen: Nasopharyngeal  Result Value Ref Range   MRSA by PCR NEGATIVE NEGATIVE    Comment:        The GeneXpert MRSA Assay (FDA approved for NASAL specimens only), is one component of a comprehensive MRSA colonization surveillance program. It is not intended to diagnose MRSA infection nor to guide or monitor treatment for MRSA infections. Performed at [facility], [place] [address], [place], [place] [address]   Troponin I (High Sensitivity)     Status: Abnormal   Collection Time: 08/23/19  8:51 PM  Result Value Ref Range   Troponin I (High Sensitivity) 39 (H) <18 ng/L    Comment: (NOTE) Elevated high sensitivity troponin I (hsTnI) values and significant  changes across serial measurements may suggest ACS but many other  chronic and acute conditions are known to elevate hsTnI results.  Refer to the "Links" section for chest pain algorithms and  additional  guidance. Performed at [facility], [place] [address], [place], [place] [address]   CBC     Status: Abnormal   Collection Time: 08/24/19  3:16 AM  Result Value Ref Range   WBC 4.9 4.0 - 10.5 K/uL    Comment: REPEATED TO VERIFY WHITE COUNT CONFIRMED ON SMEAR    RBC 3.67 (L) 4.22 - 5.81 MIL/uL   Hemoglobin 11.0 (L) 13.0 - 17.0 g/dL   HCT 33.4 (L) 39.0 - 52.0 %   MCV 91.0 80.0 - 100.0 fL   MCH 30.0 26.0 - 34.0 pg   MCHC 32.9 30.0 - 36.0 g/dL   RDW 14.4 11.5 - 15.5 %   Platelets 216 150 - 400 K/uL   nRBC Not Measured 0.0 - 0.2 %    Comment: Performed at [facility], [place] [address], [place], [place] [id number]  Basic metabolic panel     Status: Abnormal   Collection Time: 08/24/19  3:16 AM  Result Value Ref Range   Sodium 139 135 - 145 mmol/L   Potassium 3.8 3.5 - 5.1 mmol/L   Chloride 106 98 - 111 mmol/L   CO2 20 (L) 22 - 32 mmol/L   Glucose, Bld 86 70 - 99 mg/dL   BUN 37 (H) 6 - 20 mg/dL   Creatinine, Ser 4.33 (H) 0.61 - 1.24 mg/dL   Calcium 8.9 8.9 - 10.3 mg/dL   GFR calc non Af Amer 16 (L) >60 mL/min   GFR calc Af Amer 18 (L) >60 mL/min   Anion gap 13 5 - 15  Comment: Performed at [facility], [place] [address] [place], [place] [address]  Troponin I (High Sensitivity)     Status: Abnormal   Collection Time: 08/24/19  3:16 AM  Result Value Ref Range   Troponin I (High Sensitivity) 39 (H) <18 ng/L    Comment: (NOTE) Elevated high sensitivity troponin I (hsTnI) values and significant  changes across serial measurements may suggest ACS but many other  chronic and acute conditions are known to elevate hsTnI results.  Refer to the "Links" section for chest pain algorithms and additional  guidance. Performed at [facility], [place] [address], [place],  [address]     Vas US Renal Artery Duplex  Result Date: 08/24/2019 ABDOMINAL VISCERAL High Risk Factors: Hypertension. Limitations: Air/bowel gas. Performing Technologist: June Leap RDMS, RVT  Examination Guidelines: A complete evaluation includes B-mode imaging, spectral Doppler, color Doppler, and power Doppler as needed of all accessible portions of each vessel. Bilateral testing is considered an integral part of a complete examination. Limited examinations for reoccurring indications may be performed as noted.  Duplex Findings: +--------------------+--------+--------+------+--------------+ Mesenteric          PSV cm/sEDV cm/sPlaque   Comments    +--------------------+--------+--------+------+--------------+ Aorta at SMA                              not visualized +--------------------+--------+--------+------+--------------+ Celiac Artery Origin                      not visualized +--------------------+--------+--------+------+--------------+ SMA Origin                                not visualized +--------------------+--------+--------+------+--------------+    +------------------+--------+--------+-------+ Right Renal ArteryPSV cm/sEDV cm/sComment +------------------+--------+--------+-------+ Origin              123      18           +------------------+--------+--------+-------+ Proximal             67      20           +------------------+--------+--------+-------+ Mid                 104      17           +------------------+--------+--------+-------+ Distal              110      11           +------------------+--------+--------+-------+ +-----------------+--------+--------+-------+ Left Renal ArteryPSV cm/sEDV cm/sComment +-----------------+--------+--------+-------+ Origin              46      8            +-----------------+--------+--------+-------+ Proximal            46      5            +-----------------+--------+--------+-------+ Mid                 45      8            +-----------------+--------+--------+-------+ Distal              57      12            +-----------------+--------+--------+-------+ +------------+--------+--------+----+-----------+--------+--------+----+ Right KidneyPSV cm/sEDV cm/sRI  Left KidneyPSV cm/sEDV cm/sRI   +------------+--------+--------+----+-----------+--------+--------+----+  Upper Pole  23      7       0.70Upper Pole 28      6       0.80 +------------+--------+--------+----+-----------+--------+--------+----+ Mid         36      10      0.71Mid        28      8       0.70 +------------+--------+--------+----+-----------+--------+--------+----+ Lower Pole  24      7       0.71Lower Pole                      +------------+--------+--------+----+-----------+--------+--------+----+ Hilar       82      27      0.67Hilar      60      9       0.85 +------------+--------+--------+----+-----------+--------+--------+----+ +------------------+----+------------------+----+ Right Kidney          Left Kidney            +------------------+----+------------------+----+ RAR                   RAR                    +------------------+----+------------------+----+ RAR (manual)          RAR (manual)           +------------------+----+------------------+----+ Cortex                Cortex                 +------------------+----+------------------+----+ Cortex thickness      Corex thickness        +------------------+----+------------------+----+ Kidney length (cm)9.08Kidney length (cm)8.40 +------------------+----+------------------+----+   Summary: Renal:  Right: No evidence of right renal artery stenosis. Abnormal right        Resistive Index. Left:  No evidence of left renal artery stenosis. Abnormal left        Resisitve Index.  *See table(s) above for measurements and observations.     Preliminary     ROS Blood pressure (!) 163/86, pulse 87, temperature 98.3 F (36.8 C), temperature source Oral, resp. rate 14, height 5\' 11"  (1.803 m), weight 86.9 kg, SpO2 99 %. Physical  Exam Alert awake.  Packing in place and minimal to no bleeding. There is left merocel sponge. His BP is 170/90  Assessment/Plan: Epistaxis- he needs to keep the pack for 5-6 days. Cover for staph with antibiotics. Call if further bleeding.   Melissa Montane 08/24/2019, 9:44 AM

## 2019-08-24 NOTE — Plan of Care (Signed)
  Problem: Education: Goal: Knowledge of General Education information will improve Description: Including pain rating scale, medication(s)/side effects and non-pharmacologic comfort measures Denies pain.  Medications being administered explained.  NPO status explained. Condition explained Outcome: Progressing   Problem: Health Behavior/Discharge Planning: Goal: Ability to manage health-related needs will improve Outcome: Progressing   Problem: Clinical Measurements: Goal: Ability to maintain clinical measurements within normal limits will improve Outcome: Progressing   Problem: Activity: Goal: Risk for activity intolerance will decrease Outcome: Progressing   Problem: Nutrition: Goal: Adequate nutrition will be maintained Outcome: Progressing   Problem: Coping: Goal: Level of anxiety will decrease Outcome: Progressing   Problem: Skin Integrity: Goal: Risk for impaired skin integrity will decrease Outcome: Progressing

## 2019-08-24 NOTE — Progress Notes (Signed)
24 hour urine collection started per policy per orders. Will continue to monitor.

## 2019-08-24 NOTE — Progress Notes (Signed)
  Echocardiogram 2D Echocardiogram has been performed.  Stanley [name] 08/24/2019, 8:42 AM

## 2019-08-24 NOTE — Progress Notes (Addendum)
NAME:  Stanley [name], MRN:  [id number], DOB:  11/21/1975, LOS: 1 ADMISSION DATE:  08/23/2019, CONSULTATION DATE:  08/23/2019 REFERRING MD: Lorin Mercy- [facility], CHIEF COMPLAINT:  epistaxis  Brief History   44 yo M with poorly controlled HTN presents for epistaxis, noted to have HTN urgency   History of present illness   43 yo M PMH CKD, HTN, HLD, Anxiety, depression presents to [facility] ED with CC epistaxis. Bleeding began 2 days prior to presentation and did not resolve with pressure, but did intermittently spontaneously resolve, then recurred.11/20 when epistaxis recurred it was persistent without periods of resolution or improvement, prompting ED presentation. In ED, patient with SBP > 240. Initially patient without complaints of HA, dizziness, nausea. Patient given 1x labetalol, 1x hydralazine for SBP and nose was packed. SBP continues to be > 200 and nose continues to bleed. For these reasons, PCCM asked to admit. Patient to be started on cleviprex gtt in ED for BP control.   BMP significant for BUN 40, Cr 4.95.   COVID-19 pending Past Medical History  HTN HLD CKD  Anxiety Depression  Hx diastolic heart failure, LV Hypertrophy  Significant Hospital Events   11/20 Nasal packing for epistaxis, SBP > 240 with progressive dizziness, HA. Initially to be admitted to [facility] however now requiring gtt for hemodynamic control. Will admit to PCCM   Consults:  PCCM ENT   Procedures:    Significant Diagnostic Tests:  11/20 Renal US >>> 11/20  Micro Data:  11/20 SARS CoV2>>>   Antimicrobials:    Interim history/subjective:  Very anxious in ED and states he wants to leave  Objective   Blood pressure (!) 166/89, pulse 72, temperature 98.3 F (36.8 C), temperature source Oral, resp. rate 18, height 5\' 11"  (1.803 m), weight 86.9 kg, SpO2 98 %.        Intake/Output Summary (Last 24 hours) at 08/24/2019 0723 Last data filed at 08/24/2019 0600 Gross per 24 hour  Intake 451.34 ml  Output 900  ml  Net -448.66 ml   Filed Weights   08/23/19 1554 08/23/19 2007 08/24/19 0454  Weight: 83.9 kg 86.9 kg 86.9 kg    Examination: General: WDWN adult M, reclined in bed, anxious appearing HENT: [place]. L nare packing saturated with sanguinous output. Sanguinous post-nasal drip.  Lungs: CTA, symmetrical chest expansion. No accessory muscle use Cardiovascular: RRR s1s2 no rgm. Cap refill < 3 sec Abdomen: soft, round ndnt + bowel sounds  Extremities: Symmetrical bulk and tone. Trace BLE edema. No obvious joint deformity  Neuro: AAOx4 following commands. PERRL GU: defer  Resolved Hospital Problem list     Assessment & Plan:   Hypertensive urgency  -presents with SBP 243, lowered x 10%, then to 160-180. -multiple admissions for this in past  Hx HTN, uncontrolled  --home:amlodipine 10mg  daily, clonidine 0.3mg  TID,  hydralazine 100mg  TID, labetalol 400mg  TID, Toprol 50mg  daily, doxazosin 4mg  at bedtime, furosemide 20mg  prn - taking daily in AM  P -modify SBP goal to 140-160 -cleviprex for goal -close hemodynamic monitoring -Slowly adding back patients home meds (11/21 request by consult service to remain NPO so will defer further PO additions at this time)  -ECHO-- if LV dysfunction would add diuretic  -Check cortisol  -Patient will need optimization of home regimen; there are ongoing concerns/anxiety surrounding ability to afford medications. In setting of CKD, some options are limited  -renal US, plasma metanephrines, urine cat.  CKD -not on dialysis -2/2 HTN P -Labs without acute indication for dialysis -  Trend renal indices and electrolytes -Strict  I/O   Epistaxis -bleeding began 11/18, was intermittent, now constant -s/p packing in ED 11/20.  -denies facial trauma, hx nosebleeds P -ENT consulted; overnight reportedly have  requested patient to remain NPO.  -Continues to protect airway -CBC stable; trend PRN  -No chemical VTE ppx in setting of ongoing epistaxis  Anxiety  -home buspar Sleep disturbance -home trazodone  P -environmental interventions for calm environment  -qHS trazodone   HLD -holding lipitor    Best practice:  Diet: NPO, ice chips + meds  Pain/Anxiety/Delirium protocol (if indicated): na VAP protocol (if indicated): na DVT prophylaxis: SCD GI prophylaxis: protonix Glucose control: monitor Mobility: BR Code Status: Full Family Communication: Patient updated 11/21 Disposition: Remains in ICU due to vasoactive gtt  Labs   CBC: Recent Labs  Lab 08/23/19 1211 08/24/19 0316  WBC 3.7* 4.9  HGB 11.3* 11.0*  HCT 36.1* 33.4*  MCV 94.5 91.0  PLT 242 [id number]    Basic Metabolic Panel: Recent Labs  Lab 08/23/19 1211 08/23/19 1800 08/24/19 0316  NA 139  --  139  K 4.1  --  3.8  CL 103  --  106  CO2 23  --  20*  GLUCOSE 88  --  86  BUN 40*  --  37*  CREATININE 4.95*  --  4.33*  CALCIUM 9.2  --  8.9  MG  --  2.4  --   PHOS  --  3.7  --    GFR: Estimated Creatinine Clearance: 23.7 mL/min (A) (by C-G formula based on SCr of 4.33 mg/dL (H)). Recent Labs  Lab 08/23/19 1211 08/24/19 0316  WBC 3.7* 4.9    Liver Function Tests: No results for input(s): AST, ALT, ALKPHOS, BILITOT, PROT, ALBUMIN in the last 168 hours. No results for input(s): LIPASE, AMYLASE in the last 168 hours. No results for input(s): AMMONIA in the last 168 hours.  ABG    Component Value Date/Time   HCO3 31.0 (H) 10/22/2007 1526   TCO2 33 10/22/2007 1526     Coagulation Profile: Recent Labs  Lab 08/23/19 1854  INR 1.1    Cardiac Enzymes: No results for input(s): CKTOTAL, CKMB, CKMBINDEX, TROPONINI in the last 168 hours.  HbA1C: Hemoglobin A1C  Date/Time Value Ref Range Status  03/01/2018 02:26 PM 4.9 4.0 - 5.6 % Final  11/24/2017 03:22 PM 5.5  Final   Hgb A1c MFr Bld  Date/Time Value Ref Range Status  09/27/2016 03:01 AM 4.8 4.8 - 5.6 % Final    Comment:    (NOTE)         Pre-diabetes: 5.7 - 6.4         Diabetes: >6.4          Glycemic control for adults with diabetes: <7.0     CBG: No results for input(s): GLUCAP in the last 168 hours.   Critical care time: 35 min      CRITICAL CARE Performed by: Cristal Generous  Critical care time was exclusive of separately billable procedures and treating other patients. Critical care was necessary to treat or prevent imminent or life-threatening deterioration.  Critical care was time spent personally by me on the following activities: development of treatment plan with patient and/or surrogate as well as nursing, discussions with consultants, evaluation of patient's response to treatment, examination of patient, obtaining history from patient or surrogate, ordering and performing treatments and interventions, ordering and review of laboratory studies, ordering and review of radiographic studies,  pulse oximetry and re-evaluation of patient's condition.   Eliseo Gum MSN, AGACNP-BC [place] [id number] If no answer, [id number] 08/24/2019, 7:23 AM  Attending Note:  43 year old male with malignant HTN and CKD who presents with hypertensive emergency requiring cleveprix drip.  Patient remains profoundly hypertensive.  Repeat epistaxis overnight.  No further events overnight.  No new complaints.  On exam, bibasilar crackles.  I reviewed CXR myself, pulmonary edema noted.  Discussed with PCCM-NP.  Restart PO Labetalol 400 q8, hydralazine 100 mg PO q8, clonidine 0.3 TID, amlodipine 10 daily and doxazosin 4 mg QHS.  Will attempt to get off cleveprix today.  Accept SBP of 140-160.  Will give 2 doses of 80 mg of IV lasix for diureses.  Work up for pheochromocytoma and ultrasound of the kidneys ordered.  The patient is critically ill with multiple organ systems failure and requires high complexity decision making for assessment and support, frequent evaluation and titration of therapies, application of advanced monitoring technologies and extensive  interpretation of multiple databases.   Critical Care Time devoted to patient care services described in this note is  32  Minutes. This time reflects time of care of this signee Dr Jennet Maduro. This critical care time does not reflect procedure time, or teaching time or supervisory time of PA/NP/Med student/Med Resident etc but could involve care discussion time.  Rush Farmer, M.D. [facility] Pulmonary/Critical Care Medicine.

## 2019-08-25 DIAGNOSIS — I160 Hypertensive urgency: Secondary | ICD-10-CM

## 2019-08-25 LAB — BASIC METABOLIC PANEL
Anion gap: 14 (ref 5–15)
BUN: 54 mg/dL — ABNORMAL HIGH (ref 6–20)
CO2: 19 mmol/L — ABNORMAL LOW (ref 22–32)
Calcium: 9.1 mg/dL (ref 8.9–10.3)
Chloride: 103 mmol/L (ref 98–111)
Creatinine, Ser: 5.08 mg/dL — ABNORMAL HIGH (ref 0.61–1.24)
GFR calc Af Amer: 15 mL/min — ABNORMAL LOW (ref >60)
GFR calc non Af Amer: 13 mL/min — ABNORMAL LOW (ref >60)
Glucose, Bld: 168 mg/dL — ABNORMAL HIGH (ref 70–99)
Potassium: 3.4 mmol/L — ABNORMAL LOW (ref 3.5–5.1)
Sodium: 136 mmol/L (ref 135–145)

## 2019-08-25 LAB — TRIGLYCERIDES: Triglycerides: 129 mg/dL (ref <150)

## 2019-08-25 MED ORDER — METOPROLOL SUCCINATE ER 50 MG PO TB24
50.0000 mg | ORAL_TABLET | Freq: Every day | ORAL | Status: DC
  Administered 2019-08-25 – 2019-08-26 (×2): 50 mg via ORAL
  Filled 2019-08-25 (×2): qty 1

## 2019-08-25 MED ORDER — PANTOPRAZOLE SODIUM 40 MG PO TBEC
40.0000 mg | DELAYED_RELEASE_TABLET | Freq: Every day | ORAL | Status: DC
  Administered 2019-08-25: 40 mg via ORAL
  Filled 2019-08-25: qty 1

## 2019-08-25 MED ORDER — POTASSIUM CHLORIDE CRYS ER 20 MEQ PO TBCR
40.0000 meq | EXTENDED_RELEASE_TABLET | Freq: Once | ORAL | Status: AC
  Administered 2019-08-25: 40 meq via ORAL
  Filled 2019-08-25: qty 2

## 2019-08-25 MED ORDER — FUROSEMIDE 10 MG/ML IJ SOLN
40.0000 mg | Freq: Every day | INTRAMUSCULAR | Status: DC
  Administered 2019-08-25 – 2019-08-26 (×2): 40 mg via INTRAVENOUS
  Filled 2019-08-25 (×2): qty 4

## 2019-08-25 NOTE — Progress Notes (Addendum)
NAME:  Stanley [name], MRN:  [id number], DOB:  [birth year], LOS: 2 ADMISSION DATE:  08/23/2019, CONSULTATION DATE:  08/23/2019 REFERRING MD: Lorin Mercy- [facility], CHIEF COMPLAINT:  epistaxis  Brief History   43 yo M with poorly controlled HTN presents for epistaxis, noted to have HTN urgency   History of present illness   43 yo M PMH CKD, HTN, HLD, Anxiety, depression presents to [facility] ED with CC epistaxis. Bleeding began 2 days prior to presentation and did not resolve with pressure, but did intermittently spontaneously resolve, then recurred.11/20 when epistaxis recurred it was persistent without periods of resolution or improvement, prompting ED presentation. In ED, patient with SBP > 240. Initially patient without complaints of HA, dizziness, nausea. Patient given 1x labetalol, 1x hydralazine for SBP and nose was packed. SBP continues to be > 200 and nose continues to bleed. For these reasons, PCCM asked to admit. Patient to be started on cleviprex gtt in ED for BP control.   BMP significant for BUN 40, Cr 4.95.   COVID-19 pending  Past Medical History  HTN HLD CKD  Anxiety Depression  Hx diastolic heart failure, LV Hypertrophy  Significant Hospital Events   11/20 Nasal packing for epistaxis, SBP > 240 with progressive dizziness, HA. Initially to be admitted to [facility] however now requiring gtt for hemodynamic control. Will admit to PCCM   Consults:  PCCM ENT   Procedures:    Significant Diagnostic Tests:  11/20 Renal art [place] >>> 11/20 ECHO > LVEF 65-70% Severe LVH. Elevated LA pressure. Grade II diastolic dysfunction. RVSP 38.5. Dilated LA, dilated RA. Trivial pericardial effusion, circumferential. Mild-moderate aortic valve regurgitation, no evidence of sclerosis or steonsis.   Micro Data:  11/20 SARS CoV2>>>   Antimicrobials:   Interim history/subjective:  NAD NAEO Endorses discomfort with nasal packing   Objective   Blood pressure (!) 159/99, pulse 68, temperature 97.9 F  (36.6 C), temperature source Oral, resp. rate 10, height 5\' 11"  (1.803 m), weight 80.5 kg, SpO2 98 %.        Intake/Output Summary (Last 24 hours) at 08/25/2019 0830 Last data filed at 08/25/2019 0600 Gross per 24 hour  Intake 2927.19 ml  Output 3700 ml  Net -772.81 ml   Filed Weights   08/23/19 2007 08/24/19 0454 08/25/19 0400  Weight: 86.9 kg 86.9 kg 80.5 kg    Examination: General: WDWN adult M, NAD reclined in bed  HENT: [place]. L nasal packing with sanguinous saturation. Anicteric sclera. Pink mmm  Lungs: CTA, unlabored, even respirations  Cardiovascular: RRR s1s2 no rgm. Cap refill < 3 seconds  Abdomen: soft round ndnt + bowel sounds  Extremities: Symemtrical bulk and tone, no joint deformity. Trace edema  Neuro: AAOx4, PERRL, following commands  GU: defer  Resolved Hospital Problem list     Assessment & Plan:   Hypertensive urgency  -presents with SBP 243, lowered x 10%, then to 160-180, then 140-160.  -multiple admissions for this in past  Hx HTN, uncontrolled  --home:amlodipine 10mg  daily, clonidine 0.3mg  TID,  hydralazine 100mg  TID, labetalol 400mg  TID, Toprol 50mg  daily, doxazosin 4mg  at bedtime, furosemide 20mg  prn - taking daily in AM  P -SBP goal 120-140 -cleviprex for goal -close hemodynamic monitoring -On entire home reg at this time + 40 lasix  -will order for K replacement in setting of diuresis -Patient will need optimization of home regimen; there are ongoing concerns/anxiety surrounding ability to afford medications. In setting of CKD, some options are limited  - plasma metanephrine ordered;  pending -24 urine collection for Ur metanephrines, Ur catecholamines ordered   CKD -not on dialysis -2/2 HTN -No RAS noted  P -Trend renal indices and electrolytes -Strict  I/O   Epistaxis -bleeding began 11/18, was intermittent, now constant -s/p packing in ED 11/20.  -denies facial trauma, hx nosebleeds P -ENT has seen, rec nasal packing x 5 days   -Continues to protect airway -CBC stable; trend PRN  -No chemical VTE ppx in setting of ongoing epistaxis  Anxiety -home buspar Sleep disturbance -home trazodone  P -environmental interventions for calm environment  -qHS trazodone   HLD -holding lipitor    Best practice:  Diet: renal, heart healthy Pain/Anxiety/Delirium protocol (if indicated): na VAP protocol (if indicated): na DVT prophylaxis: SCD GI prophylaxis: protonix Glucose control: monitor Mobility: BR Code Status: Full Family Communication: Patient updated  Disposition: Remains in ICU due to cleviprex  Labs   CBC: Recent Labs  Lab 08/23/19 1211 08/24/19 0316  WBC 3.7* 4.9  HGB 11.3* 11.0*  HCT 36.1* 33.4*  MCV 94.5 91.0  PLT 242 [id number]    Basic Metabolic Panel: Recent Labs  Lab 08/23/19 1211 08/23/19 1800 08/24/19 0316  NA 139  --  139  K 4.1  --  3.8  CL 103  --  106  CO2 23  --  20*  GLUCOSE 88  --  86  BUN 40*  --  37*  CREATININE 4.95*  --  4.33*  CALCIUM 9.2  --  8.9  MG  --  2.4  --   PHOS  --  3.7  --    GFR: Estimated Creatinine Clearance: 23.7 mL/min (A) (by C-G formula based on SCr of 4.33 mg/dL (H)). Recent Labs  Lab 08/23/19 1211 08/24/19 0316  WBC 3.7* 4.9    Liver Function Tests: No results for input(s): AST, ALT, ALKPHOS, BILITOT, PROT, ALBUMIN in the last 168 hours. No results for input(s): LIPASE, AMYLASE in the last 168 hours. No results for input(s): AMMONIA in the last 168 hours.  ABG    Component Value Date/Time   HCO3 31.0 (H) 10/22/2007 1526   TCO2 33 10/22/2007 1526     Coagulation Profile: Recent Labs  Lab 08/23/19 1854  INR 1.1    Cardiac Enzymes: No results for input(s): CKTOTAL, CKMB, CKMBINDEX, TROPONINI in the last 168 hours.  HbA1C: Hemoglobin A1C  Date/Time Value Ref Range Status  03/01/2018 02:26 PM 4.9 4.0 - 5.6 % Final  11/24/2017 03:22 PM 5.5  Final   Hgb A1c MFr Bld  Date/Time Value Ref Range Status  09/27/2016 03:01 AM 4.8 4.8  - 5.6 % Final    Comment:    (NOTE)         Pre-diabetes: 5.7 - 6.4         Diabetes: >6.4         Glycemic control for adults with diabetes: <7.0     CBG: No results for input(s): GLUCAP in the last 168 hours.   Critical care time: 31 min     Eliseo Gum MSN, AGACNP-BC [place] [id number] If no answer, [id number] 08/25/2019, 8:30 AM  Attending Note:  43 year old male with malignant HTN and associated hypertrophic cardiomyopathy and renal failure who presents to PCCM with a hypertensive emergency.  Yesterday, patient was started on all his home medications but continues to require very close monitoring of his BP and a cleveprix drip.  Renal function is worsening on labs this AM.  Epistaxis seems  to have resolved.  On exam, bibasilar crackles.  I reviewed CXR myself, cardiomegaly noted.  Discussed with PCCM-NP.  Will continue to keep patient in the ICU given need for a titratable cleveprix drip.  Increase oral anti-HTN.  PCCM will continue to manage.  The patient is critically ill with multiple organ systems failure and requires high complexity decision making for assessment and support, frequent evaluation and titration of therapies, application of advanced monitoring technologies and extensive interpretation of multiple databases.   Critical Care Time devoted to patient care services described in this note is  31  Minutes. This time reflects time of care of this signee Dr Jennet Maduro. This critical care time does not reflect procedure time, or teaching time or supervisory time of PA/NP/Med student/Med Resident etc but could involve care discussion time.  Rush Farmer, M.D. [facility] Pulmonary/Critical Care Medicine.

## 2019-08-25 NOTE — Progress Notes (Signed)
eLink Physician-Brief Progress Note Patient Name: AZARIYAH RAMUS DOB: 1976/01/18 MRN: [id number]   Date of Service  08/25/2019  HPI/Events of Note  Asked to change IV PPI to oral since patient tolerating oral diet with no issues  eICU Interventions  Order placed     Intervention Category Minor Interventions: Routine modifications to care plan (e.g. PRN medications for pain, fever)  Margaretmary Lombard 08/25/2019, 8:08 PM

## 2019-08-26 LAB — GLUCOSE, CAPILLARY: Glucose-Capillary: 99 mg/dL (ref 70–99)

## 2019-08-26 MED ORDER — ATORVASTATIN CALCIUM 40 MG PO TABS
40.0000 mg | ORAL_TABLET | Freq: Every day | ORAL | Status: DC
  Administered 2019-08-26: 40 mg via ORAL
  Filled 2019-08-26: qty 1

## 2019-08-26 MED ORDER — LABETALOL HCL 300 MG PO TABS
600.0000 mg | ORAL_TABLET | Freq: Three times a day (TID) | ORAL | Status: DC
  Administered 2019-08-26 – 2019-08-27 (×3): 600 mg via ORAL
  Filled 2019-08-26 (×4): qty 2

## 2019-08-26 MED ORDER — CEPHALEXIN 250 MG PO CAPS
250.0000 mg | ORAL_CAPSULE | Freq: Two times a day (BID) | ORAL | Status: DC
  Administered 2019-08-26 – 2019-08-27 (×3): 250 mg via ORAL
  Filled 2019-08-26 (×3): qty 1

## 2019-08-26 NOTE — Consult Note (Signed)
[facility] Renal Consultation Note  Requesting MD: Chesley Mires Indication for Consultation:  CKD, HTN   Chief complaint epistaxis   HPI:  Stanley [name] is a 43 y.o. male with a history of CKD stage IV (trends as below), hypertension, chronic diastolic CHF, and severe LVH who has recently re-established care after being lost to follow-up with nephrology.  He presented on 07/08/19 to the extender clinic after missing multiple appointments over a year.  Previously assessed by Dr. [name] in clinic.  On 10/5 his creatinine was 5.5, BUN 42, potassium 4.4, hemoglobin 12.2 and PTH 151 per [facility] records.  Per clinic records anti-hypertensives are the following though there was concern for compliance per clinic note.  He has been transitioned to labetalol from metoprolol.  Team has him on clevidipine gtt and is hoping to wean - pressures 140's on exam.  The patient states that he has been noncompliant for a couple of days prior to admission however mother manages his meds and reports that he was taking.  We discussed his renal function and he would want dialysis when needed and understands no acute need but approaching dialysis.  States he thinks will miss an appt at our office due to hospitalization.  Has been seen by ENT for epistaxis which is improving.    - amlodipine 10mg  QD - Clonidine 0.3mg  TID - furosemide 40mg  QD - hydralazine 100mg  TID - Toprol 50mg  QD -Imdur 30mg  QD  Creat  Date/Time Value Ref Range Status  06/20/2017 11:00 AM 3.44 (H) 0.60 - 1.35 mg/dL Final  11/02/2016 02:50 PM 3.80 (H) 0.60 - 1.35 mg/dL Final   Creatinine, Ser  Date/Time Value Ref Range Status  08/25/2019 09:20 AM 5.08 (H) 0.61 - 1.24 mg/dL Final  08/24/2019 03:16 AM 4.33 (H) 0.61 - 1.24 mg/dL Final  08/23/2019 12:11 PM 4.95 (H) 0.61 - 1.24 mg/dL Final  04/24/2019 11:54 AM 7.21 (H) 0.76 - 1.27 mg/dL Final  05/18/2018 01:47 PM 3.24 (H) 0.61 - 1.24 mg/dL Final  03/01/2018 01:56 PM 3.42 (H)  0.76 - 1.27 mg/dL Final  02/12/2018 03:12 AM 3.19 (H) 0.61 - 1.24 mg/dL Final  02/11/2018 06:46 AM 3.11 (H) 0.61 - 1.24 mg/dL Final  02/10/2018 03:27 PM 3.18 (H) 0.61 - 1.24 mg/dL Final  01/16/2018 09:35 AM 3.61 (H) 0.61 - 1.24 mg/dL Final  11/24/2017 04:06 PM 2.86 (H) 0.76 - 1.27 mg/dL Final  06/15/2017 08:47 AM 3.68 (H) 0.61 - 1.24 mg/dL Final  06/14/2017 05:49 AM 3.29 (H) 0.61 - 1.24 mg/dL Final  06/13/2017 03:13 AM 2.90 (H) 0.61 - 1.24 mg/dL Final  06/12/2017 09:03 AM 3.11 (H) 0.61 - 1.24 mg/dL Final  11/30/2016 10:06 AM 3.44 (H) 0.76 - 1.27 mg/dL Final  10/03/2016 02:43 AM 3.85 (H) 0.61 - 1.24 mg/dL Final  10/02/2016 04:07 AM 3.82 (H) 0.61 - 1.24 mg/dL Final  10/01/2016 11:40 AM 3.90 (H) 0.61 - 1.24 mg/dL Final  09/30/2016 03:41 AM 3.82 (H) 0.61 - 1.24 mg/dL Final  09/29/2016 02:29 AM 3.55 (H) 0.61 - 1.24 mg/dL Final  09/28/2016 03:13 AM 2.99 (H) 0.61 - 1.24 mg/dL Final  09/27/2016 04:33 AM 3.16 (H) 0.61 - 1.24 mg/dL Final  09/27/2016 03:01 AM 3.15 (H) 0.61 - 1.24 mg/dL Final  09/26/2016 08:30 PM 3.18 (H) 0.61 - 1.24 mg/dL Final  09/29/2009 04:30 PM 1.10 0.4 - 1.5 mg/dL Final  10/22/2007 03:23 PM 1.1  Final     PMHx:   Past Medical History:  Diagnosis Date  . Anxiety   .  CKD (chronic kidney disease), stage IV ([place])   . Depression   . Dyslexia   . Heart palpitations   . High cholesterol   . Hypertension   . Hypertensive urgency   . Stroke ([place]) 06/03/2017   "minor; completely recovered today" (06/13/2017)  . Tobacco abuse     Past Surgical History:  Procedure Laterality Date  . NO PAST SURGERIES      Family Hx:  Family History  Problem Relation Age of Onset  . Hypertension Mother   . Hypertension Brother   . Diabetes type II Brother     Social History:  reports that he has been smoking cigarettes. He has a 30.00 pack-year smoking history. He has never used smokeless tobacco. He reports current drug use. Drug: Marijuana. He reports that he does not drink  alcohol.  Allergies:  Allergies  Allergen Reactions  . Lactose Intolerance (Gi) Nausea And Vomiting  . Other Swelling    All beans  . Isosorbide     headaches  . Nitroglycerin Anxiety and Other (See Comments)    Pain and feels like he is on fire    Medications: Prior to Admission medications   Medication Sig Start Date End Date Taking? Authorizing Provider  acetaminophen (TYLENOL) 500 MG tablet Take 1,000 mg by mouth as needed for mild pain.   Yes [provider]  albuterol (PROVENTIL HFA;VENTOLIN HFA) 108 (90 Base) MCG/ACT inhaler Inhale 2 puffs into the lungs every 4 (four) hours as needed for wheezing or shortness of breath (cough, shortness of breath or wheezing.). 11/24/17  Yes Scot Jun, FNP  aspirin EC 81 MG tablet Take 81 mg by mouth daily.   Yes [provider]  atorvastatin (LIPITOR) 40 MG tablet TAKE 1 TABLET (40 MG TOTAL) BY MOUTH DAILY AT 6 PM. 07/08/19  Yes Skains, Thana Farr, MD  cloNIDine (CATAPRES) 0.3 MG tablet TAKE 1 TABLET (0.3 MG TOTAL) BY MOUTH 3 (THREE) TIMES DAILY. 05/28/19  Yes Jerline Pain, MD  doxazosin (CARDURA) 8 MG tablet Take 1 tablet (8 mg total) by mouth daily. 04/24/19  Yes Jerline Pain, MD  folic acid (FOLVITE) [id number] MCG tablet Take 400 mcg by mouth daily.   Yes [provider]  furosemide (LASIX) 20 MG tablet TAKE 1 TABLET (20 MG TOTAL) BY MOUTH DAILY AS NEEDED FOR UP TO 30 DAYS. 06/03/19 08/25/19 Yes [name], FNP  hydrALAZINE (APRESOLINE) 100 MG tablet TAKE 1 TABLET (100 MG TOTAL) BY MOUTH 3 (THREE) TIMES DAILY. 05/28/19  Yes Jerline Pain, MD  labetalol (NORMODYNE) 200 MG tablet TAKE 2 TABLETS (400 MG TOTAL) BY MOUTH 3 (THREE) TIMES DAILY. Patient taking differently: Take 600 mg by mouth 3 (three) times daily.  12/31/18  Yes Jerline Pain, MD  Multiple Vitamin (MULTIVITAMIN WITH MINERALS) TABS tablet Take 1 tablet by mouth daily as needed.   Yes [provider]  ondansetron (ZOFRAN ODT) 4 MG  disintegrating tablet Take 1 tablet (4 mg total) by mouth every 8 (eight) hours as needed for nausea or vomiting. 05/31/19  Yes [name], FNP  Potassium Gluconate 595 MG CAPS Take 1 capsule by mouth daily.   Yes [provider]  PSYLLIUM PO Take 1 tablet by mouth daily.   Yes [provider]  traZODone (DESYREL) 100 MG tablet TAKE 1 TABLET (100 MG TOTAL) BY MOUTH AT BEDTIME AS NEEDED FOR SLEEP. 05/01/19  Yes [name], FNP    I have reviewed the patient's  current medications.  Labs:  BMP Latest Ref Rng & Units 08/25/2019 08/24/2019 08/23/2019  Glucose 70 - 99 mg/dL 168(H) 86 88  BUN 6 - 20 mg/dL 54(H) 37(H) 40(H)  Creatinine 0.61 - 1.24 mg/dL 5.08(H) 4.33(H) 4.95(H)  BUN/Creat Ratio 9 - 20 - - -  Sodium 135 - 145 mmol/L 136 139 139  Potassium 3.5 - 5.1 mmol/L 3.4(L) 3.8 4.1  Chloride 98 - 111 mmol/L 103 106 103  CO2 22 - 32 mmol/L 19(L) 20(L) 23  Calcium 8.9 - 10.3 mg/dL 9.1 8.9 9.2    Urinalysis    Component Value Date/Time   COLORURINE STRAW (A) 02/10/2018 1652   APPEARANCEUR CLEAR 02/10/2018 1652   LABSPEC 1.009 02/10/2018 1652   PHURINE 6.0 02/10/2018 1652   GLUCOSEU NEGATIVE 02/10/2018 1652   HGBUR NEGATIVE 02/10/2018 1652   BILIRUBINUR negative 02/20/2019 1301   BILIRUBINUR neg 09/11/2018 1334   KETONESUR negative 02/20/2019 1301   KETONESUR NEGATIVE 02/10/2018 1652   PROTEINUR >=300 (A) 02/20/2019 1301   PROTEINUR Positive (A) 09/11/2018 1334   PROTEINUR 100 (A) 02/10/2018 1652   UROBILINOGEN 0.2 02/20/2019 1301   UROBILINOGEN 0.2 11/24/2017 1509   NITRITE Negative 02/20/2019 1301   NITRITE neg 09/11/2018 1334   NITRITE NEGATIVE 02/10/2018 1652   LEUKOCYTESUR Trace (A) 02/20/2019 1301     ROS:  Pertinent items noted in HPI and remainder of comprehensive ROS otherwise negative. nosebleed as above   Physical Exam: Vitals:   08/26/19 1500 08/26/19 1600  BP: 133/84 (!) 157/96  Pulse: 66 66  Resp: 15 13  Temp:    SpO2: 99%  100%     General: adult male in bed in NAD  HEENT: NCAT  Eyes:EOMI sclera anicteric Neck: supple trachea midline Heart: RRR; no rub Lungs: clear to auscultation and unlabored  Abdomen: soft/NT/ND; normal bowel sounds Extremities: trace to no edema appreciated Skin: no rash on extremities exposed  Neuro: alert and oriented x3   Assessment/Plan:  # HTN emergency  - Continue current regimen and transition off of clevidipine as tolerated    # CKD stage IV  - Progressive and near CKD stage V - does not have access and had been previously lost to follow-up until re-establishing last month - not on HD however near the need for HD   # Epistaxis  - noted and resolving per pt   # Dyslexia  - Noted patient's mother manages his medications   # Anemia - secondary to CKD as well as acute blood loss - no acute need for PRBC's - trend   # Secondary hyperparathyroidism  - update PTH - unsure he will be able to get calcitriol outpatient    Claudia Desanctis 08/26/2019, 4:11 PM

## 2019-08-26 NOTE — Evaluation (Signed)
Physical Therapy Evaluation Patient Details Name: Stanley [name] MRN: [id number] DOB: [birth year] Today's Date: 08/26/2019   History of Present Illness  43 yo M PMH CKD, HTN, HLD, Anxiety, depression presents to [facility] ED with CC epistaxis. Bleeding began 2 days prior to presentation and did not resolve with pressure, but did intermittently spontaneously resolve, then recurred.11/20 when epistaxis recurred it was persistent without periods of resolution or improvement. In ED, patient with SBP > 240. Admitted 11/20 to ICU for BP control.  Clinical Impression  PTA pt living with mother, brother and 3 nieces in house with 4 steps to enter. Pt reports independence with household level ambulation, and with ADLs. Pt has dyslexia and his mother manages his medications. Pt is currently limited in safe mobility by increase in BP with mobility (see General Comments) in presence of decreased strength and balance. Pt is mod I for bed mobility, min guard for transfers and min guard progressing to supervision for ambulation of 2x100 feet without AD. Pt will not need additional PT services at discharge, however PT will follow acutely until d/c to progress safe mobility.     Follow Up Recommendations No PT follow up;Supervision/Assistance - 24 hour    Equipment Recommendations  None recommended by PT       Precautions / Restrictions Precautions Precautions: None Restrictions Weight Bearing Restrictions: No      Mobility  Bed Mobility Overal bed mobility: Modified Independent             General bed mobility comments: HoB elevated and use of bed rails   Transfers Overall transfer level: Needs assistance Equipment used: None Transfers: Sit to/from Stand Sit to Stand: Supervision         General transfer comment: supervision for safety  Ambulation/Gait Ambulation/Gait assistance: Min guard;Supervision Gait Distance (Feet): 100 Feet(2x100) Assistive device: None Gait Pattern/deviations:  Step-through pattern;Drifts right/left Gait velocity: slowed Gait velocity interpretation: 1.31 - 2.62 ft/sec, indicative of limited community ambulator General Gait Details: min guard for minor instability that progressed to supervision as stability increased, slight drift right and left , complaints of abdominal pain and request to use bathroom on return to room         Balance Overall balance assessment: Mild deficits observed, not formally tested                                           Pertinent Vitals/Pain Pain Assessment: No/denies pain    Home Living Family/patient expects to be discharged to:: Private residence Living Arrangements: Parent;Other relatives(mother. 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 BP        Extremity/Trunk Assessment   Upper Extremity Assessment Upper Extremity Assessment: Defer to OT evaluation    Lower Extremity Assessment Lower Extremity Assessment: Generalized weakness       Communication   Communication: No difficulties  Cognition Arousal/Alertness: Awake/alert Behavior During Therapy: WFL for tasks assessed/performed Overall Cognitive Status: Within Functional  Limits for tasks assessed                                        General Comments General comments (skin integrity, edema, etc.): Increase in BP with activity, sitting 111/73, standing  124/74, ambulation of 100 feet in standing 142/90 and after 200 feet in sitting 160/84, after using bathroom 173/102         Assessment/Plan    PT  Assessment Patient needs continued PT services  PT Problem List Decreased activity tolerance;Cardiopulmonary status limiting activity       PT Treatment Interventions DME instruction;Gait training;Stair training;Functional mobility training;Therapeutic activities;Therapeutic exercise;Balance training;Cognitive remediation;Patient/family education    PT Goals (Current goals can be found in the Care Plan section)  Acute Rehab PT Goals Patient Stated Goal: be home for Thanksgivign PT Goal Formulation: With patient Time For Goal Achievement: 09/09/19 Potential to Achieve Goals: Fair    Frequency Min 3X/week    AM-PAC PT "6 Clicks" Mobility  Outcome Measure Help needed turning from your back to your side while in a flat bed without using bedrails?: None Help needed moving from lying on your back to sitting on the side of a flat bed without using bedrails?: A Little Help needed moving to and from a bed to a chair (including a wheelchair)?: None Help needed standing up from a chair using your arms (e.g., wheelchair or bedside chair)?: None Help needed to walk in hospital room?: None Help needed climbing 3-5 steps with a railing? : A Little 6 Click Score: 22    End of Session Equipment Utilized During Treatment: Gait belt Activity Tolerance: Treatment limited secondary to medical complications (Comment)(increase in BP with ambulation ) Patient left: in bed;with call bell/phone within reach Nurse Communication: Mobility status;Other (comment)(increased BP with ambulation ) PT Visit Diagnosis: Other abnormalities of gait and mobility (R26.89);Difficulty in walking, not elsewhere classified (R26.2)    Time: [id number] PT Time Calculation (min) (ACUTE ONLY): 30 min   Charges:   PT Evaluation $PT Eval Moderate Complexity: 1 Mod          Tatjana Turcott B. Migdalia Dk PT, DPT Acute Rehabilitation Services Pager [phone] Office [phone]   [place] 08/26/2019,  4:13 PM

## 2019-08-26 NOTE — Evaluation (Addendum)
Occupational Therapy Evaluation Patient Details Name: Stanley [name] MRN: [id number] DOB: [birth year] Today's Date: 08/26/2019    History of Present Illness 43 yo M PMH CKD, HTN, HLD, Anxiety, depression presents to [facility] ED with CC epistaxis. Bleeding began 2 days prior to presentation and did not resolve with pressure, but did intermittently spontaneously resolve, then recurred.11/20 when epistaxis recurred it was persistent without periods of resolution or improvement. In ED, patient with SBP > 240. Admitted 11/20 to ICU for BP control.   Clinical Impression   Pt admitted with above diagnoses, presenting with limited BADL endurance 2/2 high BP and epistaxis. 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 Mother manages meds due to dyslexia. Overall pt is mod I for BADL and able to physically complete tasks, but lacks endurance 2/2 increasing BP. Able to complete functional mobility at household level distance. Pt will not need f/u OT, but will continue to follow acutely to progress BADL endurance per POC below.  BP in session are as follows: 111/73 seated EOB; 124/74 standing; 142/90 functional mobility; 160/84 seated back EOB; 173/102 after few minutes of rest    Follow Up Recommendations  No OT follow up    Equipment Recommendations  None recommended by OT    Recommendations for Other Services       Precautions / Restrictions Precautions Precautions: None Restrictions Weight Bearing Restrictions: No      Mobility Bed Mobility Overal bed mobility: Modified Independent             General bed mobility comments: HoB elevated and use of bed rails   Transfers Overall transfer level: Needs assistance Equipment used: None Transfers: Sit to/from Stand Sit to Stand: Supervision         General transfer comment: supervision for safety    Balance Overall balance assessment: Mild deficits observed, not formally  tested                                         ADL either performed or assessed with clinical judgement   ADL Overall ADL's : Modified independent;At baseline                                     Functional mobility during ADLs: Supervision/safety General ADL Comments: pt completing BADL all at mod I level, mostly at baseline. Pt completed toilet transfer and LB dressing without assist at eval. He is limited by high BP and epistaxis to progress mobility tolerance for prior level of function     Vision Patient Visual Report: No change from baseline       Perception     Praxis      Pertinent Vitals/Pain Pain Assessment: No/denies pain     Hand Dominance     Extremity/Trunk Assessment Upper Extremity Assessment Upper Extremity Assessment: Overall WFL for tasks assessed   Lower Extremity Assessment Lower Extremity Assessment: Defer to PT evaluation       Communication Communication Communication: No difficulties   Cognition Arousal/Alertness: Awake/alert Behavior During Therapy: WFL for tasks assessed/performed Overall Cognitive Status: Within Functional Limits for tasks assessed  General Comments  Increase in BP with activity, sitting 111/73, standing  124/74, ambulation of 100 feet in standing 142/90 and after 200 feet in sitting 160/84, after using bathroom 173/102     Exercises     Shoulder Instructions      Home Living Family/patient expects to be discharged to:: Private residence Living Arrangements: Parent;Other relatives(mother, brother and 3 nieces) Available Help at Discharge: Family;Available 24 hours/day Type of Home: House Home Access: Stairs to enter [employer] of Steps: 4   Home Layout: One level     Bathroom Shower/Tub: Teacher, early years/pre: Standard     Home Equipment: None          Prior Functioning/Environment Level of  Independence: Independent        Comments: independent with mobility, ADLs, per notes mother assists with medications due to dyslexia, 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 BP        OT Problem List: Decreased knowledge of use of DME or AE;Decreased activity tolerance;Impaired balance (sitting and/or standing)      OT Treatment/Interventions: Self-care/ADL training;Therapeutic exercise;Patient/family education;Balance training;Therapeutic activities;Energy conservation;DME and/or AE instruction    OT Goals(Current goals can be found in the care plan section) Acute Rehab OT Goals Patient Stated Goal: be home at thanksgiving OT Goal Formulation: With patient Time For Goal Achievement: 09/09/19 Potential to Achieve Goals: Good  OT Frequency: Min 1X/week   Barriers to D/C:            Co-evaluation              AM-PAC OT "6 Clicks" Daily Activity     Outcome Measure Help from another person eating meals?: None Help from another person taking care of personal grooming?: None Help from another person toileting, which includes using toliet, bedpan, or urinal?: None Help from another person bathing (including washing, rinsing, drying)?: None Help from another person to put on and taking off regular upper body clothing?: None Help from another person to put on and taking off regular lower body clothing?: None 6 Click Score: 24   End of Session Nurse Communication: Mobility status  Activity Tolerance: Patient tolerated treatment well Patient left: in bed;with call bell/phone within reach  OT Visit Diagnosis: Other abnormalities of gait and mobility (R26.89)                Time: [id number] OT Time Calculation (min): 27 min Charges:  OT General Charges $OT Visit: 1 Visit OT Evaluation $OT Eval Low Complexity: 1 Low  Zenovia Jarred, MSOT, OTR/L [place] OT/ Acute Relief OT [facility] Office:  [place] 08/26/2019, 5:43 PM

## 2019-08-26 NOTE — Progress Notes (Signed)
Assisted tele visit to patient with family member.  Overton Boggus M, RN  

## 2019-08-26 NOTE — Progress Notes (Signed)
Updated pts mother at length via phone 11/23. Questions answered.  She feels like stress from a recent breakup with his girlfriend has contributed to his HTN crisis. She manages his medications at home r/t pt's dyslexia and feels that he has been mostly compliant with his previous home anti-HTN regimen.   Nickolas Madrid, NP Pulmonary/Critical Care Medicine  08/26/2019  1:54 PM

## 2019-08-26 NOTE — Progress Notes (Signed)
NAME:  Stanley [name], MRN:  [id number], DOB:  1976/03/16, LOS: 3 ADMISSION DATE:  08/23/2019, CONSULTATION DATE:  08/23/2019 REFERRING MD: Lorin Mercy- [facility], CHIEF COMPLAINT:  epistaxis  Brief History   43 yo M with poorly controlled HTN presents for epistaxis, noted to have HTN urgency   History of present illness   42 yo M PMH CKD, HTN, HLD, Anxiety, depression presents to [facility] ED with CC epistaxis. Bleeding began 2 days prior to presentation and did not resolve with pressure, but did intermittently spontaneously resolve, then recurred.11/20 when epistaxis recurred it was persistent without periods of resolution or improvement, prompting ED presentation. In ED, patient with SBP > 240. Initially patient without complaints of HA, dizziness, nausea. Patient given 1x labetalol, 1x hydralazine for SBP and nose was packed. SBP continues to be > 200 and nose continues to bleed. For these reasons, PCCM asked to admit. Patient to be started on cleviprex gtt in ED for BP control.   BMP significant for BUN 40, Cr 4.95.   COVID-19 >>> neg   Past Medical History  HTN HLD CKD  Anxiety Depression  Hx diastolic heart failure, LV Hypertrophy  Significant Hospital Events   11/20 Nasal packing for epistaxis, SBP > 240 with progressive dizziness, HA. Initially to be admitted to [facility] however now requiring gtt for hemodynamic control. Will admit to PCCM   Consults:  PCCM ENT   Procedures:    Significant Diagnostic Tests:  11/20 Renal art [place] >>> 11/20 ECHO > LVEF 65-70% Severe LVH. Elevated LA pressure. Grade II diastolic dysfunction. RVSP 38.5. Dilated LA, dilated RA. Trivial pericardial effusion, circumferential. Mild-moderate aortic valve regurgitation, no evidence of sclerosis or steonsis.   Micro Data:  11/20 SARS CoV2>>> neg   Antimicrobials:   Interim history/subjective:    Objective   Blood pressure 135/75, pulse 70, temperature 97.9 F (36.6 C), temperature source Oral, resp. rate 20,  height 5\' 11"  (1.803 m), weight 81.8 kg, SpO2 99 %.        Intake/Output Summary (Last 24 hours) at 08/26/2019 1049 Last data filed at 08/26/2019 1000 Gross per 24 hour  Intake 886.98 ml  Output 400 ml  Net 486.98 ml   Filed Weights   08/24/19 0454 08/25/19 0400 08/26/19 0300  Weight: 86.9 kg 80.5 kg 81.8 kg    Examination: General: WDWN adult M, NAD in bed  HENT: mm moist, no JVD, L nare nasal packing dried  Lungs: resps even non labored on RA Cardiovascular: RRR s1s2 no rgm.  Abdomen: soft round ndnt + bowel sounds, tol PO diet  Extremities: Symemtrical bulk and tone, no joint deformity. No edema  Neuro: AAOx4, PERRL, following commands  GU: defer  Resolved Hospital Problem list     Assessment & Plan:   Hypertensive urgency  -presents with SBP 243, lowered x 10%, then to 160-180, then 140-160.  -multiple admissions for this in past  Hx HTN, uncontrolled  --home:amlodipine 10mg  daily, clonidine 0.3mg  TID,  hydralazine 100mg  TID, labetalol 400mg  TID, Toprol 50mg  daily, doxazosin 4mg  at bedtime, furosemide 20mg  prn - taking daily in AM  P Increase labetalol to 600mg  po q8  Continue other oral home anti-HTN and attempt wean cleviprex  SBP goal 120-140 Patient will need optimization of home regimen; there are ongoing concerns/anxiety surrounding ability to afford medications. In setting of CKD, some options are limited  24 urine collection for Ur metanephrines, Ur catecholamines ordered   CKD -not on dialysis -2/2 HTN -No RAS noted  P -  Trend renal indices and electrolytes -Strict  I/O  -will ask renal to see   Epistaxis -bleeding began 11/18, was intermittent, now constant -s/p packing in ED 11/20.  -denies facial trauma, hx nosebleeds P -ENT has seen, rec nasal packing x 5-6 days (11/26-27) -CBC stable; trend PRN  -No chemical VTE ppx in setting of ongoing epistaxis -added staph abx coverage in setting nasal packing   Anxiety -home buspar Sleep disturbance  -home trazodone  P -environmental interventions for calm environment  -qHS trazodone   HLD -holding lipitor    Best practice:  Diet: renal, heart healthy Pain/Anxiety/Delirium protocol (if indicated): na VAP protocol (if indicated): na DVT prophylaxis: SCD GI prophylaxis: protonix Glucose control: monitor Mobility: BR Code Status: Full Family Communication: Patient updated  Disposition: Remains in ICU due to cleviprex  Labs   CBC: Recent Labs  Lab 08/23/19 1211 08/24/19 0316  WBC 3.7* 4.9  HGB 11.3* 11.0*  HCT 36.1* 33.4*  MCV 94.5 91.0  PLT 242 [id number]    Basic Metabolic Panel: Recent Labs  Lab 08/23/19 1211 08/23/19 1800 08/24/19 0316 08/25/19 0920  NA 139  --  139 136  K 4.1  --  3.8 3.4*  CL 103  --  106 103  CO2 23  --  20* 19*  GLUCOSE 88  --  86 168*  BUN 40*  --  37* 54*  CREATININE 4.95*  --  4.33* 5.08*  CALCIUM 9.2  --  8.9 9.1  MG  --  2.4  --   --   PHOS  --  3.7  --   --    GFR: Estimated Creatinine Clearance: 20.2 mL/min (A) (by C-G formula based on SCr of 5.08 mg/dL (H)). Recent Labs  Lab 08/23/19 1211 08/24/19 0316  WBC 3.7* 4.9    Liver Function Tests: No results for input(s): AST, ALT, ALKPHOS, BILITOT, PROT, ALBUMIN in the last 168 hours. No results for input(s): LIPASE, AMYLASE in the last 168 hours. No results for input(s): AMMONIA in the last 168 hours.  ABG    Component Value Date/Time   HCO3 31.0 (H) 10/22/2007 1526   TCO2 33 10/22/2007 1526     Coagulation Profile: Recent Labs  Lab 08/23/19 1854  INR 1.1    Cardiac Enzymes: No results for input(s): CKTOTAL, CKMB, CKMBINDEX, TROPONINI in the last 168 hours.  HbA1C: Hemoglobin A1C  Date/Time Value Ref Range Status  03/01/2018 02:26 PM 4.9 4.0 - 5.6 % Final  11/24/2017 03:22 PM 5.5  Final   Hgb A1c MFr Bld  Date/Time Value Ref Range Status  09/27/2016 03:01 AM 4.8 4.8 - 5.6 % Final    Comment:    (NOTE)         Pre-diabetes: 5.7 - 6.4         Diabetes:  >6.4         Glycemic control for adults with diabetes: <7.0     CBG: No results for input(s): GLUCAP in the last 168 hours.   Critical care time: 67 min     Eliseo Gum MSN, AGACNP-BC [place] [id number] If no answer, [id number] 08/26/2019, 10:49 AM  Attending Note:  43 year old male with malignant HTN and associated hypertrophic cardiomyopathy and renal failure who presents to PCCM with a hypertensive emergency.  Yesterday, patient was started on all his home medications but continues to require very close monitoring of his BP and a cleveprix drip.  Renal function is worsening on labs this  AM.  Epistaxis seems to have resolved.  On exam, bibasilar crackles.  I reviewed CXR myself, cardiomegaly noted.  Discussed with PCCM-NP.  Will continue to keep patient in the ICU given need for a titratable cleveprix drip.  Increase oral anti-HTN.  PCCM will continue to manage.

## 2019-08-27 DIAGNOSIS — N185 Chronic kidney disease, stage 5: Secondary | ICD-10-CM

## 2019-08-27 LAB — RENAL FUNCTION PANEL
Albumin: 3.6 g/dL (ref 3.5–5.0)
Anion gap: 12 (ref 5–15)
BUN: 61 mg/dL — ABNORMAL HIGH (ref 6–20)
CO2: 23 mmol/L (ref 22–32)
Calcium: 8.8 mg/dL — ABNORMAL LOW (ref 8.9–10.3)
Chloride: 102 mmol/L (ref 98–111)
Creatinine, Ser: 7.08 mg/dL — ABNORMAL HIGH (ref 0.61–1.24)
GFR calc Af Amer: 10 mL/min — ABNORMAL LOW (ref >60)
GFR calc non Af Amer: 9 mL/min — ABNORMAL LOW (ref >60)
Glucose, Bld: 94 mg/dL (ref 70–99)
Phosphorus: 5.6 mg/dL — ABNORMAL HIGH (ref 2.5–4.6)
Potassium: 3.7 mmol/L (ref 3.5–5.1)
Sodium: 137 mmol/L (ref 135–145)

## 2019-08-27 LAB — CBC
HCT: 27.1 % — ABNORMAL LOW (ref 39.0–52.0)
Hemoglobin: 8.9 g/dL — ABNORMAL LOW (ref 13.0–17.0)
MCH: 30.2 pg (ref 26.0–34.0)
MCHC: 32.8 g/dL (ref 30.0–36.0)
MCV: 91.9 fL (ref 80.0–100.0)
Platelets: 220 10*3/uL (ref 150–400)
RBC: 2.95 MIL/uL — ABNORMAL LOW (ref 4.22–5.81)
RDW: 14.1 % (ref 11.5–15.5)
WBC: 4.2 10*3/uL (ref 4.0–10.5)
nRBC: 0 % (ref 0.0–0.2)

## 2019-08-27 MED ORDER — DARBEPOETIN ALFA 40 MCG/0.4ML IJ SOSY: 40.0000 ug | PREFILLED_SYRINGE | Freq: Once | INTRAMUSCULAR | Status: DC

## 2019-08-27 NOTE — Discharge Summary (Addendum)
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 Found to have SBP > 240.  Had nasal packing in ER and ENT consulted.  Started on cleviprex for BP control and admitted to ICU.  COVID 19 test negative.  Started on keflex for prophylaxis while nasal packing in place.  Oral antihypertensive regimen adjusted.  Nephrology consulted.  Concern he might need HD and plan to have perm cath placed if renal function got worse.  Plan was to remove nasal packing on 11/25 and then d/c keflex on 11/26.  Patient felt he needed to be at home for Thanksgiving holiday and did not want to stay in hospital.  He also felt he needed to discuss with his primary nephrologist whether he should be on dialysis.  He planned to leave against medical advice.  Video conference was done with his mother, and his brother was at bedside.  Patient initially said he would remain in hospital to complete treatment.  However, he later changed his mind and left hospital against medical advice.   Chesley Mires, MD [facility] Pulmonary/Critical Care 08/27/2019, 1:45 PM

## 2019-08-27 NOTE — Progress Notes (Signed)
Pt plans to leave against medical advice. I spoke with his mother and brother to see if they could advise the pt to stay to get the HD catheter in order to get HD tomorrow. Pt creatinine continues to increase. Dr. Royce Macadamia from nephrology was informed of pts decision and advised that he shouldn't leave against medical advice. Dr. Halford Chessman spoke with the pt about this decision and the pt still would prefer to leave. Waiting for family to arrive. Will continue to monitor.

## 2019-08-27 NOTE — Progress Notes (Signed)
Pt has signed the [place] papers, pts brother is at bedside. Dr. Halford Chessman has been made aware of this situation. Will continue to monitor until pt departs.

## 2019-08-27 NOTE — Progress Notes (Signed)
[facility] Progress Note  Name: Stanley [name] MRN: [id number] DOB: 04-12-1976  Chief Complaint:  Epistaxis   Subjective:  had 1.7 liters UOP over 11/23.  He confirmed that he would want HD when needed.  No hx of malignancy and we discussed aranesp - he is ok with starting the aranesp.  Still on the gtt - hard to get off with the parameters.   Review of systems:  Denies shortness of breath or chest pain  Denies n/v Urinating ok  Reports nose bleed much better   --------------------- Background on consult:  Stanley [name] is a 43 y.o. male with a history of CKD stage IV (trends as below), hypertension, chronic diastolic CHF, and severe LVH who has recently re-established care after being lost to follow-up with nephrology.  He presented on 07/08/19 to the extender clinic after missing multiple appointments over a year.  Previously assessed by Dr. [name] in clinic.  On 10/5 his creatinine was 5.5, BUN 42, potassium 4.4, hemoglobin 12.2 and PTH 151 per [facility] records.  Per clinic records anti-hypertensives are the following though there was concern for compliance per clinic note.  He has been transitioned to labetalol from metoprolol.  Team has him on clevidipine gtt and is hoping to wean - pressures 140's on exam.  The patient states that he has been noncompliant for a couple of days prior to admission however mother manages his meds and reports that he was taking.  We discussed his renal function and he would want dialysis when needed and understands no acute need but approaching dialysis.  States he thinks will miss an appt at our office due to hospitalization.  Has been seen by ENT for epistaxis which is improving.  outpatient regimen:   - amlodipine 10mg  QD - Clonidine 0.3mg  TID - furosemide 40mg  QD - hydralazine 100mg  TID - Toprol 50mg  QD -Imdur 30mg  QD   Intake/Output Summary (Last 24 hours) at 08/27/2019 0703 Last data filed at 08/27/2019 0600 Gross per 24  hour  Intake 80.72 ml  Output 1700 ml  Net -1619.28 ml    Vitals:  Vitals:   08/27/19 0530 08/27/19 0551 08/27/19 0600 08/27/19 0630  BP: 136/77 136/77 133/77 135/82  Pulse: 61  65 61  Resp: 14  13 15   Temp:      TempSrc:      SpO2: 97%  98% 98%  Weight:      Height:         Physical Exam:  General adult male in bed in no acute distress HEENT normocephalic atraumatic extraocular movements intact sclera anicteric Neck supple trachea midline Lungs clear to auscultation bilaterally normal work of breathing at rest  Heart regular rate and rhythm no rubs or gallops appreciated Abdomen soft nontender nondistended Extremities no edema  Psych normal mood and affect Neuro - alert and oriented x 3   Medications reviewed  Labs:  BMP Latest Ref Rng & Units 08/27/2019 08/25/2019 08/24/2019  Glucose 70 - 99 mg/dL 94 168(H) 86  BUN 6 - 20 mg/dL 61(H) 54(H) 37(H)  Creatinine 0.61 - 1.24 mg/dL 7.08(H) 5.08(H) 4.33(H)  BUN/Creat Ratio 9 - 20 - - -  Sodium 135 - 145 mmol/L 137 136 139  Potassium 3.5 - 5.1 mmol/L 3.7 3.4(L) 3.8  Chloride 98 - 111 mmol/L 102 103 106  CO2 22 - 32 mmol/L 23 19(L) 20(L)  Calcium 8.9 - 10.3 mg/dL 8.8(L) 9.1 8.9     Assessment/Plan:   # HTN emergency  -  Continue current regimen and transition off of clevidipine as tolerated  - see studies sent per team - metanephrines and cathecholamines    # CKD stage IV  - Progressive and near CKD stage V - does not have access and had been previously lost to follow-up until re-establishing last month - not on HD however near the need for HD  - may have some normotensive ATN vs progression with management of HTN emergency  - NPO after midnight tonight in the event HD needed 11/25 - Discontinue lasix IV for now - euvolemic   # Epistaxis  - noted and resolving per pt   # Dyslexia  - Noted patient's mother manages his medications   # Anemia - secondary to CKD as well as acute blood loss - no acute need for  PRBC's - Will initiate aranesp - pressures are improved   # Secondary hyperparathyroidism  - update PTH - unsure he will be able to get calcitriol outpatient     Claudia Desanctis, MD 08/27/2019 7:03 AM

## 2019-08-27 NOTE — Progress Notes (Signed)
NAME:  Stanley [name], MRN:  [id number], DOB:  12/12/1975, LOS: 4 ADMISSION DATE:  08/23/2019, CONSULTATION DATE:  08/23/2019 REFERRING MD: Lorin Mercy[facility], CHIEF COMPLAINT:  epistaxis  Brief History   43 yo M with poorly controlled HTN presents for epistaxis, noted to have HTN urgency   History of present illness   43 yo M PMH CKD, HTN, HLD, Anxiety, depression presents to [facility] ED with CC epistaxis. Bleeding began 2 days prior to presentation and did not resolve with pressure, but did intermittently spontaneously resolve, then recurred.11/20 when epistaxis recurred it was persistent without periods of resolution or improvement, prompting ED presentation. In ED, patient with SBP > 240. Initially patient without complaints of HA, dizziness, nausea. Patient given 1x labetalol, 1x hydralazine for SBP and nose was packed. SBP continues to be > 200 and nose continues to bleed. For these reasons, PCCM asked to admit. Patient to be started on cleviprex gtt in ED for BP control.   BMP significant for BUN 40, Cr 4.95.   COVID-19 >>> neg   Past Medical History  HTN HLD CKD  Anxiety Depression  Hx diastolic heart failure, LV Hypertrophy  Significant Hospital Events   11/20 Nasal packing for epistaxis, SBP > 240 with progressive dizziness, HA. Initially to be admitted to [facility] however now requiring gtt for hemodynamic control. Will admit to PCCM   Consults:  ENT  Nephrology  Procedures:    Significant Diagnostic Tests:  11/21 ECHO > LVEF 65-70% Severe LVH. Elevated LA pressure. Grade II diastolic dysfunction. RVSP 38.5. Dilated LA, dilated RA. Trivial pericardial effusion, circumferential. Mild-moderate aortic valve regurgitation, no evidence of sclerosis or steonsis.   Micro Data:  11/20 SARS CoV2>>> neg   Antimicrobials:    Interim history/subjective:  Remains on cleviprex.  No further epistaxis.  Denies chest pain/abdominal pain.  Objective   Blood pressure 124/64, pulse 62,  temperature 98.4 F (36.9 C), temperature source Oral, resp. rate 13, height 5\' 11"  (1.803 m), weight 83.3 kg, SpO2 98 %.        Intake/Output Summary (Last 24 hours) at 08/27/2019 0737 Last data filed at 08/27/2019 0700 Gross per 24 hour  Intake 84.72 ml  Output 1700 ml  Net -1615.28 ml   Filed Weights   08/25/19 0400 08/26/19 0300 08/27/19 0500  Weight: 80.5 kg 81.8 kg 83.3 kg    Examination:  General - alert Eyes - pupils reactive ENT - nasal packing in place Cardiac - regular rate/rhythm, no murmur Chest - equal breath sounds b/l, no wheezing or rales Abdomen - soft, non tender, + bowel sounds Extremities - no cyanosis, clubbing, or edema Skin - no rashes Neuro - normal strength, moves extremities, follows commands Psych - normal mood and behavior   Resolved Hospital Problem list     Assessment & Plan:   Hypertensive emergency. - change goal SBP to < 160 - continue norvasc, catapres, cardura, hydralazine, labetalol  CKD 5. - hold lasix - likely will need HD - nephrology arranging for perm cath if renal fx doesn't improve  Epistaxis. - seen by Dr. Janace Hoard with ENT - keep nasal packing in until 11/25 - continue keflex while nasal packing in place for Staph prophylaxis  Hx of HLD. - continue lipitor  Anemia of chronic disease. - f/u CBC - transfuse for Hb < 7 or significant bleeding  Insomnia. - trazodone qhs prn   Best practice:  Diet: renal, heart healthy; NPO after midnight on 11/24 DVT prophylaxis: SCD GI prophylaxis: not  indicated Mobility: OOB to chair Code Status: Full Disposition: ICU  Labs    CMP Latest Ref Rng & Units 08/27/2019 08/25/2019 08/24/2019  Glucose 70 - 99 mg/dL 94 168(H) 86  BUN 6 - 20 mg/dL 61(H) 54(H) 37(H)  Creatinine 0.61 - 1.24 mg/dL 7.08(H) 5.08(H) 4.33(H)  Sodium 135 - 145 mmol/L 137 136 139  Potassium 3.5 - 5.1 mmol/L 3.7 3.4(L) 3.8  Chloride 98 - 111 mmol/L 102 103 106  CO2 22 - 32 mmol/L 23 19(L) 20(L)   Calcium 8.9 - 10.3 mg/dL 8.8(L) 9.1 8.9  Total Protein 6.0 - 8.5 g/dL - - -  Total Bilirubin 0.0 - 1.2 mg/dL - - -  Alkaline Phos 39 - 117 IU/L - - -  AST 0 - 40 IU/L - - -  ALT 0 - 44 IU/L - - -    CBC Latest Ref Rng & Units 08/27/2019 08/24/2019 08/23/2019  WBC 4.0 - 10.5 K/uL 4.2 4.9 3.7(L)  Hemoglobin 13.0 - 17.0 g/dL 8.9(L) 11.0(L) 11.3(L)  Hematocrit 39.0 - 52.0 % 27.1(L) 33.4(L) 36.1(L)  Platelets 150 - 400 K/uL 220 216 242    D/w Dr. Dorethea Clan, MD [place] 08/27/2019, 7:45 AM

## 2019-08-27 NOTE — Progress Notes (Signed)
Had discussion with pt's mother and his brother at bedside with Mr. Hironaka.  After lengthy discussion Mr. Koleszar has agreed to stay in hospital and continue current therapy.  Chesley Mires, MD [facility] Pulmonary/Critical Care 08/27/2019, 10:52 AM

## 2019-08-28 LAB — CATECHOLAMINES,UR.,FREE,24 HR: Total Volume: 4600

## 2019-08-28 LAB — PTH, INTACT AND CALCIUM
Calcium, Total (PTH): 8.6 mg/dL — ABNORMAL LOW (ref 8.7–10.2)
PTH: 101 pg/mL — ABNORMAL HIGH (ref 15–65)

## 2019-08-28 MED FILL — DOXAZOSIN MESYLATE 8 MG TAB: 8 | 30 days supply | Qty: 30 | Fill #2

## 2019-08-28 MED FILL — LABETALOL HCL 200 MG TABLET: 200 | 30 days supply | Qty: 270 | Fill #1

## 2019-08-28 MED FILL — FUROSEMIDE 20 MG TABS: 20 | 30 days supply | Qty: 30 | Fill #2

## 2019-08-28 MED FILL — hydrALAZINE HCL 100 MG TABS: 100 | 30 days supply | Qty: 90 | Fill #2

## 2019-08-28 MED FILL — cloNIDine HCL 0.3 MG TABS: 0.3 | 30 days supply | Qty: 90 | Fill #2

## 2019-08-31 LAB — METANEPHRINES, URINE, 24 HOUR
Metaneph Total, Ur: 43 ug/L
Metanephrines, 24H Ur: 198 ug/24 hr (ref 58–276)
Normetanephrine, 24H Ur: 1136 ug/24 hr — ABNORMAL HIGH (ref 156–729)
Normetanephrine, Ur: 247 ug/L
Total Volume: 4600

## 2019-09-01 LAB — METANEPHRINES, PLASMA
Metanephrine, Free: 21.4 pg/mL (ref 0.0–88.0)
Normetanephrine, Free: 233.5 pg/mL — ABNORMAL HIGH (ref 0.0–125.8)

## 2019-09-02 ENCOUNTER — Ambulatory Visit: Payer: Self-pay | Admitting: Family Medicine

## 2019-09-04 ENCOUNTER — Ambulatory Visit (INDEPENDENT_AMBULATORY_CARE_PROVIDER_SITE_OTHER): Payer: Self-pay | Admitting: Family Medicine

## 2019-09-04 ENCOUNTER — Other Ambulatory Visit: Payer: Self-pay

## 2019-09-04 ENCOUNTER — Encounter: Payer: Self-pay | Admitting: Family Medicine

## 2019-09-04 VITALS — BP 164/94 | HR 62 | Temp 98.0°F | Ht 71.0 in | Wt 198.6 lb

## 2019-09-04 DIAGNOSIS — I169 Hypertensive crisis, unspecified: Secondary | ICD-10-CM

## 2019-09-04 DIAGNOSIS — R040 Epistaxis: Secondary | ICD-10-CM

## 2019-09-04 DIAGNOSIS — N5089 Other specified disorders of the male genital organs: Secondary | ICD-10-CM

## 2019-09-04 DIAGNOSIS — N390 Urinary tract infection, site not specified: Secondary | ICD-10-CM

## 2019-09-04 DIAGNOSIS — N433 Hydrocele, unspecified: Secondary | ICD-10-CM

## 2019-09-04 DIAGNOSIS — F419 Anxiety disorder, unspecified: Secondary | ICD-10-CM

## 2019-09-04 DIAGNOSIS — I10 Essential (primary) hypertension: Secondary | ICD-10-CM

## 2019-09-04 DIAGNOSIS — Z09 Encounter for follow-up examination after completed treatment for conditions other than malignant neoplasm: Secondary | ICD-10-CM

## 2019-09-04 DIAGNOSIS — R42 Dizziness and giddiness: Secondary | ICD-10-CM

## 2019-09-04 LAB — POCT URINALYSIS DIPSTICK
Bilirubin, UA: NEGATIVE
Blood, UA: NEGATIVE
Glucose, UA: NEGATIVE
Ketones, UA: NEGATIVE
Nitrite, UA: NEGATIVE
Protein, UA: POSITIVE — AB
Spec Grav, UA: 1.02 (ref 1.010–1.025)
Urobilinogen, UA: 2 E.U./dL — AB
pH, UA: 5.5 (ref 5.0–8.0)

## 2019-09-04 MED ORDER — SULFAMETHOXAZOLE-TRIMETHOPRIM 800-160 MG PO TABS: 1.0000 | ORAL_TABLET | Freq: Two times a day (BID) | ORAL | 0 refills | Status: DC

## 2019-09-04 MED FILL — SULFAMETHOXAZOLE-TMP DS TAB: 800-160 | 7 days supply | Qty: 14 | Fill #0

## 2019-09-04 NOTE — Patient Instructions (Signed)
Urinary Tract Infection, Adult A urinary tract infection (UTI) is an infection of any part of the urinary tract. The urinary tract includes:  The kidneys.  The ureters.  The bladder.  The urethra. These organs make, store, and get rid of pee (urine) in the body. What are the causes? This is caused by germs (bacteria) in your genital area. These germs grow and cause swelling (inflammation) of your urinary tract. What increases the risk? You are more likely to develop this condition if:  You have a small, thin tube (catheter) to drain pee.  You cannot control when you pee or poop (incontinence).  You are male, and: ? You use these methods to prevent pregnancy: ? A medicine that kills sperm (spermicide). ? A device that blocks sperm (diaphragm). ? You have low levels of a male hormone (estrogen). ? You are pregnant.  You have genes that add to your risk.  You are sexually active.  You take antibiotic medicines.  You have trouble peeing because of: ? A prostate that is bigger than normal, if you are male. ? A blockage in the part of your body that drains pee from the bladder (urethra). ? A kidney stone. ? A nerve condition that affects your bladder (neurogenic bladder). ? Not getting enough to drink. ? Not peeing often enough.  You have other conditions, such as: ? Diabetes. ? A weak disease-fighting system (immune system). ? Sickle cell disease. ? Gout. ? Injury of the spine. What are the signs or symptoms? Symptoms of this condition include:  Needing to pee right away (urgently).  Peeing often.  Peeing small amounts often.  Pain or burning when peeing.  Blood in the pee.  Pee that smells bad or not like normal.  Trouble peeing.  Pee that is cloudy.  Fluid coming from the vagina, if you are male.  Pain in the belly or lower back. Other symptoms include:  Throwing up (vomiting).  No urge to eat.  Feeling mixed up (confused).  Being tired  and grouchy (irritable).  A fever.  Watery poop (diarrhea). How is this treated? This condition may be treated with:  Antibiotic medicine.  Other medicines.  Drinking enough water. Follow these instructions at home:  Medicines  Take over-the-counter and prescription medicines only as told by your doctor.  If you were prescribed an antibiotic medicine, take it as told by your doctor. Do not stop taking it even if you start to feel better. General instructions  Make sure you: ? Pee until your bladder is empty. ? Do not hold pee for a long time. ? Empty your bladder after sex. ? Wipe from front to back after pooping if you are a male. Use each tissue one time when you wipe.  Drink enough fluid to keep your pee pale yellow.  Keep all follow-up visits as told by your doctor. This is important. Contact a doctor if:  You do not get better after 1-2 days.  Your symptoms go away and then come back. Get help right away if:  You have very bad back pain.  You have very bad pain in your lower belly.  You have a fever.  You are sick to your stomach (nauseous).  You are throwing up. Summary  A urinary tract infection (UTI) is an infection of any part of the urinary tract.  This condition is caused by germs in your genital area.  There are many risk factors for a UTI. These include having a small, thin   tube to drain pee and not being able to control when you pee or poop.  Treatment includes antibiotic medicines for germs.  Drink enough fluid to keep your pee pale yellow. This information is not intended to replace advice given to you by your health care provider. Make sure you discuss any questions you have with your health care provider. Document Released: 03/07/2008 Document Revised: 09/06/2018 Document Reviewed: 03/29/2018 Elsevier Patient Education  [place]. Sulfamethoxazole; Trimethoprim, SMX-TMP tablets What is this medicine? SULFAMETHOXAZOLE;  TRIMETHOPRIM or SMX-TMP (suhl fuh meth OK suh zohl; trye METH oh prim) is a combination of a sulfonamide antibiotic and a second antibiotic, trimethoprim. It is used to treat or prevent certain kinds of bacterial infections. It will not work for colds, flu, or other viral infections. This medicine may be used for other purposes; ask your health care provider or pharmacist if you have questions. COMMON BRAND NAME(S): Bacter-Aid DS, Bactrim, Bactrim DS, Septra, Septra DS What should I tell my health care provider before I take this medicine? They need to know if you have any of these conditions:  anemia  asthma  being treated with anticonvulsants  if you frequently drink alcohol containing drinks  kidney disease  liver disease  low level of folic acid or [id number] dehydrogenase  poor nutrition or malabsorption  porphyria  severe allergies  thyroid disorder  an unusual or allergic reaction to sulfamethoxazole, trimethoprim, sulfa drugs, other medicines, foods, dyes, or preservatives  pregnant or trying to get pregnant  breast-feeding How should I use this medicine? Take this medicine by mouth with a full glass of water. Follow the directions on the prescription label. Take your medicine at regular intervals. Do not take it more often than directed. Do not skip doses or stop your medicine early. Talk to your pediatrician regarding the use of this medicine in children. Special care may be needed. This medicine has been used in children as young as 26 months of age. Overdosage: If you think you have taken too much of this medicine contact a poison control center or emergency room at once. NOTE: This medicine is only for you. Do not share this medicine with others. What if I miss a dose? If you miss a dose, take it as soon as you can. If it is almost time for your next dose, take only that dose. Do not take double or extra doses. What may interact with this medicine? Do not  take this medicine with any of the following medications:  aminobenzoate potassium  dofetilide  metronidazole This medicine may also interact with the following medications:  ACE inhibitors like benazepril, enalapril, lisinopril, and ramipril  birth control pills  cyclosporine  digoxin  diuretics  indomethacin  medicines for diabetes  methenamine  methotrexate  phenytoin  potassium supplements  pyrimethamine  sulfinpyrazone  tricyclic antidepressants  warfarin This list may not describe all possible interactions. Give your health care provider a list of all the medicines, herbs, non-prescription drugs, or dietary supplements you use. Also tell them if you smoke, drink alcohol, or use illegal drugs. Some items may interact with your medicine. What should I watch for while using this medicine? Tell your doctor or health care professional if your symptoms do not improve. Drink several glasses of water a day to reduce the risk of kidney problems. Do not treat diarrhea with over the counter products. Contact your doctor if you have diarrhea that lasts more than 2 days or if it is severe and watery.  This medicine can make you more sensitive to the sun. Keep out of the sun. If you cannot avoid being in the sun, wear protective clothing and use a sunscreen. Do not use sun lamps or tanning beds/booths. What side effects may I notice from receiving this medicine? Side effects that you should report to your doctor or health care professional as soon as possible:  allergic reactions like skin rash or hives, swelling of the face, lips, or tongue  breathing problems  fever or chills, sore throat  irregular heartbeat, chest pain  joint or muscle pain  pain or difficulty passing urine  red pinpoint spots on skin  redness, blistering, peeling or loosening of the skin, including inside the mouth  unusual bleeding or bruising  unusually weak or tired  yellowing of the  eyes or skin Side effects that usually do not require medical attention (report to your doctor or health care professional if they continue or are bothersome):  diarrhea  dizziness  headache  loss of appetite  nausea, vomiting  nervousness This list may not describe all possible side effects. Call your doctor for medical advice about side effects. You may report side effects to FDA at 1-800-FDA-1088. Where should I keep my medicine? Keep out of the reach of children. Store at room temperature between 20 to 25 degrees C (68 to 77 degrees F). Protect from light. Throw away any unused medicine after the expiration date. NOTE: This sheet is a summary. It may not cover all possible information. If you have questions about this medicine, talk to your doctor, pharmacist, or health care provider.  2020 Elsevier/Gold Standard (2013-04-26 14:38:26)

## 2019-09-04 NOTE — Progress Notes (Signed)
Patient [facility] Follow Up  Subjective:  Patient ID: Stanley [name], male    DOB: 11-Jun-1976  Age: 43 y.o. MRN: [id number]  CC:  Chief Complaint  Patient presents with  . Hospitalization Follow-up    ED follow up 08/23/2019 for HTN    HPI Stanley [name] is a 43 year old male who presents for Follow Up today.   Past Medical History:  Diagnosis Date  . Anxiety   . CKD (chronic kidney disease), stage IV ([place])   . Depression   . Dyslexia   . Heart palpitations   . High cholesterol   . Hypertension   . Hypertensive urgency   . Stroke ([place]) 06/03/2017   "minor; completely recovered today" (06/13/2017)  . Tobacco abuse    Current Status: Since his last office visit, he has had a Hospital Admission for Hypertensive Crisis on 08/23/2019-08/27/2019. He states that he continues to have increased pain and swelling in left testicle. He does not take any medications for pain. His blood pressures are elevated this morning. He states that he had drink coffee this morning prior to his appointment. He denies visual changes, chest pain, cough, shortness of breath, heart palpitations, and falls. He has occasional headaches and dizziness with position changes. Denies severe headaches, confusion, seizures, double vision, and blurred vision, nausea and vomiting. His anxiety is mild today, r/t not being able to work. He denies suicidal ideations, homicidal ideations, or auditory hallucinations. He denies fevers, chills, fatigue, recent infections, weight loss, and night sweats. No reports of GI problems such as diarrhea, and constipation. He has no reports of blood in stools, dysuria and hematuria. He denies pain today.   Past Surgical History:  Procedure Laterality Date  . NO PAST SURGERIES      Family History  Problem Relation Age of Onset  . Hypertension Mother   . Hypertension Brother   . Diabetes type II Brother     Social History    Socioeconomic History  . Marital status: Married    Spouse name: Not on file  . Number of children: Not on file  . Years of education: Not on file  . Highest education level: Not on file  Occupational History  . Not on file  Social Needs  . Financial resource strain: Not on file  . Food insecurity    Worry: Not on file    Inability: Not on file  . Transportation needs    Medical: Not on file    Non-medical: Not on file  Tobacco Use  . Smoking status: Current Every Day Smoker    Packs/day: 1.00    Years: 30.00    Pack years: 30.00    Types: Cigarettes  . Smokeless tobacco: Never Used  Substance and Sexual Activity  . Alcohol use: No  . Drug use: Yes    Types: Marijuana    Comment: 06/13/2017 "not often"  . Sexual activity: Not Currently  Lifestyle  . Physical activity    Days per week: Not on file    Minutes per session: Not on file  . Stress: Not on file  Relationships  . Social Herbalist on phone: Not on file    Gets together: Not on file    Attends religious service: Not on file    Active member of club or organization: Not on file    Attends meetings of clubs or organizations: Not on file  Relationship status: Not on file  . Intimate partner violence    Fear of current or ex partner: Not on file    Emotionally abused: Not on file    Physically abused: Not on file    Forced sexual activity: Not on file  Other Topics Concern  . Not on file  Social History Narrative  . Not on file    Outpatient Medications Prior to Visit  Medication Sig Dispense Refill  . acetaminophen (TYLENOL) 500 MG tablet Take 1,000 mg by mouth as needed for mild pain.    Marland Kitchen albuterol (PROVENTIL HFA;VENTOLIN HFA) 108 (90 Base) MCG/ACT inhaler Inhale 2 puffs into the lungs every 4 (four) hours as needed for wheezing or shortness of breath (cough, shortness of breath or wheezing.). 1 Inhaler 1  . aspirin EC 81 MG tablet Take 81 mg by mouth daily.    Marland Kitchen atorvastatin (LIPITOR) 40  MG tablet TAKE 1 TABLET (40 MG TOTAL) BY MOUTH DAILY AT 6 PM. 90 tablet 0  . cloNIDine (CATAPRES) 0.3 MG tablet TAKE 1 TABLET (0.3 MG TOTAL) BY MOUTH 3 (THREE) TIMES DAILY. 270 tablet 1  . doxazosin (CARDURA) 8 MG tablet Take 1 tablet (8 mg total) by mouth daily. 30 tablet 11  . folic acid (FOLVITE) Q000111Q MCG tablet Take 400 mcg by mouth daily.    . furosemide (LASIX) 20 MG tablet TAKE 1 TABLET (20 MG TOTAL) BY MOUTH DAILY AS NEEDED FOR UP TO 30 DAYS. 30 tablet 2  . hydrALAZINE (APRESOLINE) 100 MG tablet TAKE 1 TABLET (100 MG TOTAL) BY MOUTH 3 (THREE) TIMES DAILY. 270 tablet 1  . labetalol (NORMODYNE) 200 MG tablet TAKE 2 TABLETS (400 MG TOTAL) BY MOUTH 3 (THREE) TIMES DAILY. (Patient taking differently: Take 600 mg by mouth 3 (three) times daily. ) 540 tablet 1  . Multiple Vitamin (MULTIVITAMIN WITH MINERALS) TABS tablet Take 1 tablet by mouth daily as needed.    . ondansetron (ZOFRAN ODT) 4 MG disintegrating tablet Take 1 tablet (4 mg total) by mouth every 8 (eight) hours as needed for nausea or vomiting. 30 tablet 2  . Potassium Gluconate 595 MG CAPS Take 1 capsule by mouth daily.    . PSYLLIUM PO Take 1 tablet by mouth daily.    . traZODone (DESYREL) 100 MG tablet TAKE 1 TABLET (100 MG TOTAL) BY MOUTH AT BEDTIME AS NEEDED FOR SLEEP. 30 tablet 3   No facility-administered medications prior to visit.     Allergies  Allergen Reactions  . Lactose Intolerance (Gi) Nausea And Vomiting  . Other Swelling    All beans  . Isosorbide     headaches  . Nitroglycerin Anxiety and Other (See Comments)    Pain and feels like he is on fire    ROS Review of Systems  Constitutional: Negative.   HENT: Negative.   Eyes: Negative.   Respiratory: Negative.   Cardiovascular: Negative.   Gastrointestinal: Negative.   Endocrine: Negative.   Genitourinary: Positive for scrotal swelling (chronic hydrocele).  Musculoskeletal: Negative.   Skin: Negative.   Allergic/Immunologic: Negative.   Neurological:  Positive for dizziness (occasional ) and headaches (Occasional ).  Hematological: Negative.   Psychiatric/Behavioral: Negative.       Objective:    Physical Exam  Constitutional: He is oriented to person, place, and time. He appears well-developed and well-nourished.  HENT:  Head: Normocephalic and atraumatic.  Eyes: Conjunctivae are normal.  Neck: Normal range of motion. Neck supple.  Cardiovascular: Normal rate, regular rhythm,  normal heart sounds and intact distal pulses.  Pulmonary/Chest: Effort normal and breath sounds normal.  Abdominal: Soft. Bowel sounds are normal.  Musculoskeletal: Normal range of motion.  Neurological: He is alert and oriented to person, place, and time. He has normal reflexes.  Skin: Skin is warm and dry.  Psychiatric: He has a normal mood and affect. His behavior is normal. Judgment and thought content normal.  Nursing note and vitals reviewed.   BP (!) 164/94   Pulse 62   Temp 98 F (36.7 C) (Oral)   Ht 5\' 11"  (1.803 m)   Wt 198 lb 9.6 oz (90.1 kg)   SpO2 97%   BMI 27.70 kg/m  Wt Readings from Last 3 Encounters:  09/04/19 198 lb 9.6 oz (90.1 kg)  08/27/19 183 lb 10.3 oz (83.3 kg)  05/31/19 203 lb 6.4 oz (92.3 kg)     Health Maintenance Due  Topic Date Due  . INFLUENZA VACCINE  05/04/2019    There are no preventive care reminders to display for this patient.  Lab Results  Component Value Date   TSH 1.310 03/01/2018   Lab Results  Component Value Date   WBC 4.2 08/27/2019   HGB 8.9 (L) 08/27/2019   HCT 27.1 (L) 08/27/2019   MCV 91.9 08/27/2019   PLT 220 08/27/2019   Lab Results  Component Value Date   NA 137 08/27/2019   K 3.7 08/27/2019   CO2 23 08/27/2019   GLUCOSE 94 08/27/2019   BUN 61 (H) 08/27/2019   CREATININE 7.08 (H) 08/27/2019   BILITOT <0.2 04/24/2019   ALKPHOS 90 04/24/2019   AST 13 04/24/2019   ALT 8 04/24/2019   PROT 6.7 04/24/2019   ALBUMIN 3.6 08/27/2019   CALCIUM 8.8 (L) 08/27/2019   CALCIUM 8.6  (L) 08/27/2019   ANIONGAP 12 08/27/2019   Lab Results  Component Value Date   CHOL 96 (L) 03/01/2018   Lab Results  Component Value Date   HDL 40 03/01/2018   Lab Results  Component Value Date   LDLCALC 44 03/01/2018   Lab Results  Component Value Date   TRIG 129 08/25/2019   Lab Results  Component Value Date   CHOLHDL 2.4 03/01/2018   Lab Results  Component Value Date   HGBA1C 4.9 03/01/2018      Assessment & Plan:   1. Hospital discharge follow-up  2. Epistaxis Stable today.   3. Hypertensive crisis - Urinalysis Dipstick  4. Hydrocele, unspecified hydrocele type L >R. Stable.  5. Swollen testicle Possible referral back to Urologist. L>R. - Urinalysis Dipstick  6. Anxiety Stable today.   7. Uncontrolled hypertension  8. Dizziness  9. Urinary tract infection without hematuria, site unspecified We will initiate Septra today.  - sulfamethoxazole-trimethoprim (BACTRIM DS) 800-160 MG tablet; Take 1 tablet by mouth 2 (two) times daily.  Dispense: 14 tablet; Refill: 0  10. Follow up He will follow up in 3 months.   Meds ordered this encounter  Medications  . sulfamethoxazole-trimethoprim (BACTRIM DS) 800-160 MG tablet    Sig: Take 1 tablet by mouth 2 (two) times daily.    Dispense:  14 tablet    Refill:  0    Orders Placed This Encounter  Procedures  . Urinalysis Dipstick    Referral Orders  No referral(s) requested today    Kathe Becton,  MSN, FNP-BC [facility] [address] [place],  [address] [phone] fax  Problem List Items Addressed This Visit      Cardiovascular and Mediastinum   Hypertensive crisis   Relevant Orders   Urinalysis Dipstick (Completed)   Uncontrolled hypertension     Genitourinary   Hydrocele     Other   Anxiety   Swollen testicle   Relevant Orders   Urinalysis Dipstick (Completed)    Other Visit  Diagnoses    Hospital discharge follow-up    -  Primary   Epistaxis       Dizziness       Urinary tract infection without hematuria, site unspecified       Relevant Medications   sulfamethoxazole-trimethoprim (BACTRIM DS) 800-160 MG tablet   Follow up          Meds ordered this encounter  Medications  . sulfamethoxazole-trimethoprim (BACTRIM DS) 800-160 MG tablet    Sig: Take 1 tablet by mouth 2 (two) times daily.    Dispense:  14 tablet    Refill:  0    Follow-up: Return in about 3 months (around 12/03/2019).    Stanley Glatter, FNP

## 2019-09-05 ENCOUNTER — Telehealth: Payer: Self-pay

## 2019-09-05 NOTE — Telephone Encounter (Signed)
-----   Message from [name], [place] sent at 09/04/2019  7:24 PM EST ----- Regarding: "Office Notes" Please have most recent office notes faxed to our office from [facility] for evaluation of treatment. Thank you.

## 2019-09-05 NOTE — Telephone Encounter (Signed)
This has been faxed to [facility] today. Thanks!

## 2019-09-06 MED FILL — ?AMLODIPINE BESYLATE 10 MG: 10 | 30 days supply | Qty: 30 | Fill #0

## 2019-09-13 MED FILL — SULFAMETHOXAZOLE-TMP DS TAB: 800-160 | 7 days supply | Qty: 14 | Fill #0

## 2019-09-18 ENCOUNTER — Other Ambulatory Visit: Payer: Self-pay | Admitting: Pharmacist

## 2019-09-18 DIAGNOSIS — I509 Heart failure, unspecified: Secondary | ICD-10-CM

## 2019-09-19 ENCOUNTER — Other Ambulatory Visit: Payer: Self-pay | Admitting: Family Medicine

## 2019-09-19 DIAGNOSIS — I509 Heart failure, unspecified: Secondary | ICD-10-CM

## 2019-09-19 MED ORDER — ALBUTEROL SULFATE HFA 108 (90 BASE) MCG/ACT IN AERS: 2.0000 | INHALATION_SPRAY | RESPIRATORY_TRACT | 12 refills | Status: DC | PRN

## 2019-09-19 MED FILL — ALBUTEROL SULFATE HFA 108 (: 108 (90 BAS | 25 days supply | Qty: 18 | Fill #0

## 2019-09-23 ENCOUNTER — Other Ambulatory Visit: Payer: Self-pay | Admitting: Family Medicine

## 2019-09-23 DIAGNOSIS — G4700 Insomnia, unspecified: Secondary | ICD-10-CM

## 2019-09-23 DIAGNOSIS — N390 Urinary tract infection, site not specified: Secondary | ICD-10-CM

## 2019-09-23 MED FILL — hydrALAZINE HCL 100 MG TABS: 100 | 30 days supply | Qty: 90 | Fill #3

## 2019-09-23 MED FILL — DOXAZOSIN MESYLATE 8 MG TAB: 8 | 30 days supply | Qty: 30 | Fill #3

## 2019-09-23 MED FILL — cloNIDine HCL 0.3 MG TABS: 0.3 | 30 days supply | Qty: 90 | Fill #3

## 2019-09-23 MED FILL — ?ATORVASTATIN 40MG TABLET: 40 | 30 days supply | Qty: 30 | Fill #2

## 2019-09-23 MED FILL — LABETALOL HCL 200 MG TABLET: 200 | 30 days supply | Qty: 270 | Fill #2

## 2019-09-23 NOTE — Telephone Encounter (Signed)
Rx refill Trazodone, Lasix

## 2019-09-25 ENCOUNTER — Telehealth: Payer: Self-pay

## 2019-09-25 ENCOUNTER — Other Ambulatory Visit: Payer: Self-pay | Admitting: Family Medicine

## 2019-09-25 DIAGNOSIS — I10 Essential (primary) hypertension: Secondary | ICD-10-CM

## 2019-09-25 DIAGNOSIS — G4700 Insomnia, unspecified: Secondary | ICD-10-CM

## 2019-09-25 DIAGNOSIS — I509 Heart failure, unspecified: Secondary | ICD-10-CM

## 2019-09-25 MED ORDER — TRAZODONE HCL 100 MG PO TABS: 100.0000 mg | ORAL_TABLET | Freq: Every evening | ORAL | 3 refills | Status: DC | PRN

## 2019-09-25 MED ORDER — FUROSEMIDE 20 MG PO TABS: 20.0000 mg | ORAL_TABLET | Freq: Every day | ORAL | 3 refills | Status: DC | PRN

## 2019-09-25 MED FILL — traZODone HCL 100 MG TABS: 100 | 30 days supply | Qty: 30 | Fill #0

## 2019-09-25 MED FILL — FUROSEMIDE 20 MG TABS: 20 | 30 days supply | Qty: 30 | Fill #0

## 2019-09-25 NOTE — Telephone Encounter (Signed)
Message left for all back.

## 2019-09-25 NOTE — Telephone Encounter (Signed)
-----   Message from [name], FNP sent at 09/25/2019  4:45 AM EST ----- Regarding: "Refills" Rx refills for Trazodone and Lasix sent to pharmacy today. Patient also requests refill on Bactrim. Please assess need for antibiotic. Thank you.

## 2019-09-30 MED FILL — AMLODIPINE BESYLATE 10 MG T: 10 | 90 days supply | Qty: 90 | Fill #1

## 2019-10-02 ENCOUNTER — Other Ambulatory Visit: Payer: Self-pay | Admitting: Family Medicine

## 2019-10-02 DIAGNOSIS — N390 Urinary tract infection, site not specified: Secondary | ICD-10-CM

## 2019-10-02 MED ORDER — SULFAMETHOXAZOLE-TRIMETHOPRIM 800-160 MG PO TABS: 1.0000 | ORAL_TABLET | Freq: Two times a day (BID) | ORAL | 0 refills | Status: DC

## 2019-10-02 MED FILL — SULFAMETHOXAZOLE-TMP DS TAB: 800-160 | 7 days supply | Qty: 14 | Fill #0

## 2019-10-07 ENCOUNTER — Ambulatory Visit: Payer: Self-pay | Admitting: Family Medicine

## 2019-10-29 ENCOUNTER — Other Ambulatory Visit: Payer: Self-pay

## 2019-10-29 ENCOUNTER — Encounter: Payer: Self-pay | Admitting: Family Medicine

## 2019-10-29 ENCOUNTER — Ambulatory Visit (INDEPENDENT_AMBULATORY_CARE_PROVIDER_SITE_OTHER): Payer: Medicaid Other | Admitting: Family Medicine

## 2019-10-29 DIAGNOSIS — I10 Essential (primary) hypertension: Secondary | ICD-10-CM | POA: Diagnosis not present

## 2019-10-29 DIAGNOSIS — F419 Anxiety disorder, unspecified: Secondary | ICD-10-CM | POA: Diagnosis not present

## 2019-10-29 DIAGNOSIS — R42 Dizziness and giddiness: Secondary | ICD-10-CM

## 2019-10-29 DIAGNOSIS — R040 Epistaxis: Secondary | ICD-10-CM | POA: Diagnosis not present

## 2019-10-29 DIAGNOSIS — Z09 Encounter for follow-up examination after completed treatment for conditions other than malignant neoplasm: Secondary | ICD-10-CM

## 2019-10-29 NOTE — Progress Notes (Signed)
Virtual Visit via Telephone Note  I connected with Stanley [name] on 10/29/19 at  8:20 AM EST by telephone and verified that I am speaking with the correct person using two identifiers.   I discussed the limitations, risks, security and privacy concerns of performing an evaluation and management service by telephone and the availability of in person appointments. I also discussed with the patient that there may be a patient responsible charge related to this service. The patient expressed understanding and agreed to proceed.   History of Present Illness: Past Medical History:  Diagnosis Date  . Anxiety   . CKD (chronic kidney disease), stage IV ([place])   . Depression   . Dyslexia   . Heart palpitations   . High cholesterol   . Hypertension   . Hypertensive urgency   . Polysubstance (including opioids) dependence, binge pattern ([place]) 08/2020  . Stroke ([place]) 06/03/2017   "minor; completely recovered today" (06/13/2017)  . Tobacco abuse    Social History   Tobacco Use  . Smoking status: Current Every Day Smoker    Packs/day: 1.00    Years: 30.00    Pack years: 30.00    Types: Cigarettes  . Smokeless tobacco: Never Used  Substance Use Topics  . Alcohol use: No  . Drug use: Yes    Types: Marijuana    Comment: 06/13/2017 "not often"    Past Surgical History:  Procedure Laterality Date  . NO PAST SURGERIES      Social History   Tobacco Use  . Smoking status: Current Every Day Smoker    Packs/day: 1.00    Years: 30.00    Pack years: 30.00    Types: Cigarettes  . Smokeless tobacco: Never Used  Substance Use Topics  . Alcohol use: No  . Drug use: Yes    Types: Marijuana    Comment: 06/13/2017 "not often"    Allergies  Allergen Reactions  . Lactose Intolerance (Gi) Nausea And Vomiting  . Other Swelling    All beans  . Isosorbide     headaches  . Nitroglycerin Anxiety and Other (See Comments)    Pain and feels like he is on fire    Current Outpatient Medications on  File Prior to Visit  Medication Sig Dispense Refill  . acetaminophen (TYLENOL) 500 MG tablet Take 1,000 mg by mouth as needed for mild pain.    Marland Kitchen albuterol (VENTOLIN HFA) 108 (90 Base) MCG/ACT inhaler Inhale 2 puffs into the lungs every 4 (four) hours as needed for wheezing or shortness of breath (cough, shortness of breath or wheezing.). 8 g 12  . aspirin EC 81 MG tablet Take 81 mg by mouth daily.    Marland Kitchen atorvastatin (LIPITOR) 40 MG tablet TAKE 1 TABLET (40 MG TOTAL) BY MOUTH DAILY AT 6 PM. 90 tablet 0  . cloNIDine (CATAPRES) 0.3 MG tablet TAKE 1 TABLET (0.3 MG TOTAL) BY MOUTH 3 (THREE) TIMES DAILY. 270 tablet 1  . doxazosin (CARDURA) 8 MG tablet Take 1 tablet (8 mg total) by mouth daily. 30 tablet 11  . folic acid (FOLVITE) Q000111Q MCG tablet Take 400 mcg by mouth daily.    . furosemide (LASIX) 20 MG tablet Take 1 tablet (20 mg total) by mouth daily as needed. 30 tablet 3  . hydrALAZINE (APRESOLINE) 100 MG tablet TAKE 1 TABLET (100 MG TOTAL) BY MOUTH 3 (THREE) TIMES DAILY. 270 tablet 1  . labetalol (NORMODYNE) 200 MG tablet TAKE 2 TABLETS (400 MG TOTAL) BY MOUTH 3 (  THREE) TIMES DAILY. (Patient taking differently: Take 600 mg by mouth 3 (three) times daily. ) 540 tablet 1  . Multiple Vitamin (MULTIVITAMIN WITH MINERALS) TABS tablet Take 1 tablet by mouth daily as needed.    . ondansetron (ZOFRAN ODT) 4 MG disintegrating tablet Take 1 tablet (4 mg total) by mouth every 8 (eight) hours as needed for nausea or vomiting. 30 tablet 2  . Potassium Gluconate 595 MG CAPS Take 1 capsule by mouth daily.    . PSYLLIUM PO Take 1 tablet by mouth daily.    Marland Kitchen sulfamethoxazole-trimethoprim (BACTRIM DS) 800-160 MG tablet Take 1 tablet by mouth 2 (two) times daily. 14 tablet 0  . traZODone (DESYREL) 100 MG tablet Take 1 tablet (100 mg total) by mouth at bedtime as needed for sleep. 30 tablet 3   No current facility-administered medications on file prior to visit.    Current Status: Since his last office visit, he  states that he is doing well with no complaints. His last Hospital Admission for Hypertensive Urgency was 08/22/2020-08/26/2020. His last office visit at our office was 09/03/2020. He denies visual changes, chest pain, cough, shortness of breath, heart palpitations, and falls. He has occasional headaches and dizziness with position changes. Denies severe headaches, confusion, seizures, double vision, and blurred vision, nausea and vomiting. He denies fevers, chills, fatigue, recent infections, weight loss, and night sweats. No reports of GI problems such as diarrhea, and constipation. He has no reports of blood in stools, dysuria and hematuria. No depression or anxiety reported today. He denies suicidal ideations, homicidal ideations, or auditory hallucinations. He denies pain today.    Observations/Objective:  Telephone Virtual Visit   Assessment and Plan:  1. Accelerated hypertension Blood pressures have been stable. He reports that he takes bp regularly at home. This morning's bp reading was 152/86. He will continue to take medications as prescribed, to decrease high sodium intake, excessive alcohol intake, increase potassium intake, smoking cessation, and increase physical activity of at least 30 minutes of cardio activity daily. He will continue to follow Heart Healthy or DASH diet.  2. Epistaxis Resolved.   3. Dizziness Stable.  4. Anxiety Stable today.  5. Follow up He will follow up in 1 month.    No orders of the defined types were placed in this encounter.   No orders of the defined types were placed in this encounter.   Referral Orders  No referral(s) requested today    Kathe Becton,  MSN, FNP-BC [facility] [address] [place], [place] [address] [phone]- fax    Follow Up Instructions:    I discussed the assessment and treatment plan with the patient. The patient was  provided an opportunity to ask questions and all were answered. The patient agreed with the plan and demonstrated an understanding of the instructions.   The patient was advised to call back or seek an in-person evaluation if the symptoms worsen or if the condition fails to improve as anticipated.  I provided 20 minutes of non-face-to-face time during this encounter.   [name], FNP

## 2019-11-12 ENCOUNTER — Ambulatory Visit: Payer: Self-pay | Admitting: Family Medicine

## 2019-11-18 ENCOUNTER — Other Ambulatory Visit: Payer: Self-pay

## 2019-11-18 ENCOUNTER — Encounter: Payer: Self-pay | Admitting: Family Medicine

## 2019-11-18 ENCOUNTER — Ambulatory Visit (INDEPENDENT_AMBULATORY_CARE_PROVIDER_SITE_OTHER): Payer: Medicaid Other | Admitting: Family Medicine

## 2019-11-18 VITALS — BP 147/86 | HR 64 | Temp 98.6°F | Ht 71.0 in | Wt 196.2 lb

## 2019-11-18 DIAGNOSIS — Z09 Encounter for follow-up examination after completed treatment for conditions other than malignant neoplasm: Secondary | ICD-10-CM

## 2019-11-18 DIAGNOSIS — I10 Essential (primary) hypertension: Secondary | ICD-10-CM

## 2019-11-18 DIAGNOSIS — G4730 Sleep apnea, unspecified: Secondary | ICD-10-CM

## 2019-11-18 DIAGNOSIS — N50812 Left testicular pain: Secondary | ICD-10-CM

## 2019-11-18 DIAGNOSIS — N50811 Right testicular pain: Secondary | ICD-10-CM | POA: Diagnosis not present

## 2019-11-18 DIAGNOSIS — F419 Anxiety disorder, unspecified: Secondary | ICD-10-CM

## 2019-11-18 DIAGNOSIS — N433 Hydrocele, unspecified: Secondary | ICD-10-CM | POA: Diagnosis not present

## 2019-11-18 LAB — POCT URINALYSIS DIPSTICK
Bilirubin, UA: NEGATIVE
Blood, UA: NEGATIVE
Glucose, UA: NEGATIVE
Ketones, UA: NEGATIVE
Nitrite, UA: NEGATIVE
Protein, UA: POSITIVE — AB
Spec Grav, UA: 1.025 (ref 1.010–1.025)
Urobilinogen, UA: 0.2 E.U./dL
pH, UA: 5.5 (ref 5.0–8.0)

## 2019-11-18 MED ORDER — IBUPROFEN 800 MG PO TABS: 800.0000 mg | ORAL_TABLET | Freq: Three times a day (TID) | ORAL | 3 refills | Status: DC | PRN

## 2019-11-18 NOTE — Patient Instructions (Signed)
Ibuprofen tablets and capsules What is this medicine? IBUPROFEN (eye BYOO proe fen) is a non-steroidal anti-inflammatory drug (NSAID). It is used for dental pain, fever, headaches or migraines, osteoarthritis, rheumatoid arthritis, or painful monthly periods. It can also relieve minor aches and pains caused by a cold, flu, or sore throat. This medicine may be used for other purposes; ask your health care provider or pharmacist if you have questions. COMMON BRAND NAME(S): Advil, Advil Junior Strength, Advil Migraine, Genpril, Ibren, IBU, Ibupak, Midol, Midol Cramps and Body Aches, Motrin, Motrin IB, Motrin Junior Strength, Motrin Migraine Pain, Samson-8, Toxicology Saliva Collection What should I tell my health care provider before I take this medicine? They need to know if you have any of these conditions:  cigarette smoker  coronary artery bypass graft (CABG) surgery within the past 2 weeks  drink more than 3 alcohol-containing drinks a day  heart disease  high blood pressure  history of stomach bleeding  kidney disease  liver disease  lung or breathing disease, like asthma  an unusual or allergic reaction to ibuprofen, aspirin, other NSAIDs, other medicines, foods, dyes, or preservatives  pregnant or trying to get pregnant  breast-feeding How should I use this medicine? Take this medicine by mouth with a glass of water. Follow the directions on the prescription label. Take this medicine with food if your stomach gets upset. Try to not lie down for at least 10 minutes after you take the medicine. Take your medicine at regular intervals. Do not take your medicine more often than directed. A special MedGuide will be given to you by the pharmacist with each prescription and refill. Be sure to read this information carefully each time. Talk to your pediatrician regarding the use of this medicine in children. Special care may be needed. Overdosage: If you think you have taken too much  of this medicine contact a poison control center or emergency room at once. NOTE: This medicine is only for you. Do not share this medicine with others. What if I miss a dose? If you miss a dose, take it as soon as you can. If it is almost time for your next dose, take only that dose. Do not take double or extra doses. What may interact with this medicine? Do not take this medicine with any of the following medications:  cidofovir  ketorolac  methotrexate  pemetrexed This medicine may also interact with the following medications:  alcohol  aspirin  diuretics  lithium  other drugs for inflammation like prednisone  warfarin This list may not describe all possible interactions. Give your health care provider a list of all the medicines, herbs, non-prescription drugs, or dietary supplements you use. Also tell them if you smoke, drink alcohol, or use illegal drugs. Some items may interact with your medicine. What should I watch for while using this medicine? Tell your doctor or healthcare provider if your symptoms do not start to get better or if they get worse. This medicine may cause serious skin reactions. They can happen weeks to months after starting the medicine. Contact your healthcare provider right away if you notice fevers or flu-like symptoms with a rash. The rash may be red or purple and then turn into blisters or peeling of the skin. Or, you might notice a red rash with swelling of the face, lips or lymph nodes in your neck or under your arms. This medicine does not prevent heart attack or stroke. In fact, this medicine may increase the chance of a  heart attack or stroke. The chance may increase with longer use of this medicine and in people who have heart disease. If you take aspirin to prevent heart attack or stroke, talk with your doctor or healthcare provider. Do not take other medicines that contain aspirin, ibuprofen, or naproxen with this medicine. Side effects such as  stomach upset, nausea, or ulcers may be more likely to occur. Many medicines available without a prescription should not be taken with this medicine. This medicine can cause ulcers and bleeding in the stomach and intestines at any time during treatment. Ulcers and bleeding can happen without warning symptoms and can cause death. To reduce your risk, do not smoke cigarettes or drink alcohol while you are taking this medicine. You may get drowsy or dizzy. Do not drive, use machinery, or do anything that needs mental alertness until you know how this medicine affects you. Do not stand or sit up quickly, especially if you are an older patient. This reduces the risk of dizzy or fainting spells. This medicine can cause you to bleed more easily. Try to avoid damage to your teeth and gums when you brush or floss your teeth. This medicine may be used to treat migraines. If you take migraine medicines for 10 or more days a month, your migraines may get worse. Keep a diary of headache days and medicine use. Contact your healthcare provider if your migraine attacks occur more frequently. What side effects may I notice from receiving this medicine? Side effects that you should report to your doctor or health care professional as soon as possible:  allergic reactions like skin rash, itching or hives, swelling of the face, lips, or tongue  redness, blistering, peeling or loosening of the skin, including inside the mouth  severe stomach pain  signs and symptoms of bleeding such as bloody or black, tarry stools; red or dark-brown urine; spitting up blood or brown material that looks like coffee grounds; red spots on the skin; unusual bruising or bleeding from the eye, gums, or nose  signs and symptoms of a blood clot such as changes in vision; chest pain; severe, sudden headache; trouble speaking; sudden numbness or weakness of the face, arm, or leg  unexplained weight gain or swelling  unusually weak or  tired  yellowing of eyes or skin Side effects that usually do not require medical attention (report to your doctor or health care professional if they continue or are bothersome):  bruising  diarrhea  dizziness, drowsiness  headache  nausea, vomiting This list may not describe all possible side effects. Call your doctor for medical advice about side effects. You may report side effects to FDA at 1-800-FDA-1088. Where should I keep my medicine? Keep out of the reach of children. Store at room temperature between 15 and 30 degrees C (59 and 86 degrees F). Keep container tightly closed. Throw away any unused medicine after the expiration date. NOTE: This sheet is a summary. It may not cover all possible information. If you have questions about this medicine, talk to your doctor, pharmacist, or health care provider.  2020 Elsevier/Gold Standard (2018-12-05 14:11:00)   Hydrocele, Adult A hydrocele is a collection of fluid in the loose pouch of skin that holds the testicles (scrotum). This may happen because:  The amount of fluid produced in the scrotum is not absorbed by the rest of the body.  Fluid from the abdomen fills the scrotum. Normally, the testicles develop in the abdomen then move (drop) into to the  scrotum before birth. The tube that the testicles travel through usually closes after the testicles drop. If the tube does not close, fluid from the abdomen can fill the scrotum. This is less common in adults. What are the causes? The cause of a hydrocele in adults is usually not known. However, it may be caused by:  An injury to the scrotum.  An infection (epididymitis).  Decreased blood flow to the scrotum.  Twisting of a testicle (testicular torsion).  A birth defect.  A tumor or cancer of the testicle. What are the signs or symptoms? A hydrocele feels like a water-filled balloon. It may also feel heavy. Other symptoms include:  Swelling of the scrotum. The swelling  may decrease when you lie down. You may also notice more swelling at night than in the morning.  Swelling of the groin.  Mild discomfort in the scrotum.  Pain. This can develop if the hydrocele was caused by infection or twisting. The larger the hydrocele, the more likely you are to have pain. How is this diagnosed? This condition may be diagnosed based on:  Physical exam.  Medical history. You may also have other tests, including:  Imaging tests, such as ultrasound.  Blood or urine tests. How is this treated? Most hydroceles go away on their own. If you have no discomfort or pain, your health care provider may suggest close monitoring of your condition (called watch and wait or watchful waiting) until the condition goes away or symptoms develop. If treatment is needed, it may include:  Treating an underlying condition. This may include using an antibiotic medicine to treat an infection.  Surgery to stop fluid from collecting in the scrotum.  Surgery to drain the fluid. Options include: ? Needle aspiration. A needle is used to drain fluid. However, the fluid buildup will come back quickly. ? Hydrocelectomy. For this procedure, an incision is made in the scrotum to remove the fluid sac. Follow these instructions at home:  Watch the hydrocele for any changes.  Take over-the-counter and prescription medicines only as told by your health care provider.  If you were prescribed an antibiotic medicine, use it as told by your health care provider. Do not stop taking the antibiotic even if you start to feel better.  Keep all follow-up visits as told by your health care provider. This is important. Contact a health care provider if:  You notice any changes in the hydrocele.  The swelling in your scrotum or groin gets worse.  The hydrocele becomes red, firm, painful, or tender to the touch.  You have a fever. Get help right away if you:  Develop a lot of pain, or your pain  becomes worse. Summary  A hydrocele is a collection of fluid in the loose pouch of skin that holds the testicles (scrotum).  Hydroceles can cause swelling, discomfort, and sometimes pain.  In adults, the cause of a hydrocele usually is not known. However, it is sometimes caused by an infection or a rotation and twisting of the scrotum.  Treatment is usually not needed. Hydroceles often go away on their own. If a hydrocele causes pain, treatment may be given to ease the pain. This information is not intended to replace advice given to you by your health care provider. Make sure you discuss any questions you have with your health care provider. Document Revised: 09/30/2017 Document Reviewed: 09/30/2017 Elsevier Patient Education  2020 [employer].

## 2019-11-18 NOTE — Progress Notes (Signed)
Patient [facility]   Established Patient Office Visit  Subjective:  Patient ID: Stanley [name], male    DOB: [birth year]  Age: 44 y.o. MRN: [id number]  CC:  Chief Complaint  Patient presents with  . Follow-up    HTN  . Sleep Apnea    stop breathing in sleep  . skin itching    HPI Stanley [name] is a 44 year old male who presents for Follow Up today.   Past Medical History:  Diagnosis Date  . Anxiety   . CKD (chronic kidney disease), stage IV ([place])   . Depression   . Dyslexia   . Heart palpitations   . High cholesterol   . Hypertension   . Hypertensive urgency   . Polysubstance (including opioids) dependence, binge pattern ([place]) 08/2020  . Stroke ([place]) 06/03/2017   "minor; completely recovered today" (06/13/2017)  . Tobacco abuse    Current Status: Since his last office visit, he is doing well with no complaints. He reports that his home blood pressure readings. He states that he is feeling better lately and eating well. He denies fevers, chills, fatigue, recent infections, weight loss, and night sweats. He has not had any headaches, visual changes, dizziness, and falls. No chest pain, heart palpitations, cough and shortness of breath reported. No reports of GI problems such as nausea, vomiting, diarrhea, and constipation. He has no reports of blood in stools, dysuria and hematuria. No depression or anxiety reported today. He denies suicidal ideations, homicidal ideations, or auditory hallucinations. He has moderate scrotal pain today.   Past Surgical History:  Procedure Laterality Date  . NO PAST SURGERIES      Family History  Problem Relation Age of Onset  . Hypertension Mother   . Hypertension Brother   . Diabetes type II Brother     Social History   Socioeconomic History  . Marital status: Married    Spouse name: Not on file  . Number of children: Not on file  . Years of education: Not on file  . Highest education level:  Not on file  Occupational History  . Not on file  Tobacco Use  . Smoking status: Current Every Day Smoker    Packs/day: 1.00    Years: 30.00    Pack years: 30.00    Types: Cigarettes  . Smokeless tobacco: Never Used  Substance and Sexual Activity  . Alcohol use: No  . Drug use: Yes    Types: Marijuana    Comment: 06/13/2017 "not often"  . Sexual activity: Not Currently  Other Topics Concern  . Not on file  Social History Narrative  . Not on file   Social Determinants of Health   Financial Resource Strain:   . Difficulty of Paying Living Expenses: Not on file  Food Insecurity:   . Worried About Charity fundraiser in the Last Year: Not on file  . Ran Out of Food in the Last Year: Not on file  Transportation Needs:   . Lack of Transportation (Medical): Not on file  . Lack of Transportation (Non-Medical): Not on file  Physical Activity:   . Days of Exercise per Week: Not on file  . Minutes of Exercise per Session: Not on file  Stress:   . Feeling of Stress : Not on file  Social Connections:   . Frequency of Communication with Friends and Family: Not on file  . Frequency of Social Gatherings with Friends and Family:  Not on file  . Attends Religious Services: Not on file  . Active Member of Clubs or Organizations: Not on file  . Attends Archivist Meetings: Not on file  . Marital Status: Not on file  Intimate Partner Violence:   . Fear of Current or Ex-Partner: Not on file  . Emotionally Abused: Not on file  . Physically Abused: Not on file  . Sexually Abused: Not on file    Outpatient Medications Prior to Visit  Medication Sig Dispense Refill  . acetaminophen (TYLENOL) 500 MG tablet Take 1,000 mg by mouth as needed for mild pain.    Marland Kitchen albuterol (VENTOLIN HFA) 108 (90 Base) MCG/ACT inhaler Inhale 2 puffs into the lungs every 4 (four) hours as needed for wheezing or shortness of breath (cough, shortness of breath or wheezing.). 8 g 12  . aspirin EC 81 MG  tablet Take 81 mg by mouth daily.    Marland Kitchen atorvastatin (LIPITOR) 40 MG tablet TAKE 1 TABLET (40 MG TOTAL) BY MOUTH DAILY AT 6 PM. 90 tablet 0  . cloNIDine (CATAPRES) 0.3 MG tablet TAKE 1 TABLET (0.3 MG TOTAL) BY MOUTH 3 (THREE) TIMES DAILY. 270 tablet 1  . doxazosin (CARDURA) 8 MG tablet Take 1 tablet (8 mg total) by mouth daily. 30 tablet 11  . folic acid (FOLVITE) [id number] MCG tablet Take 400 mcg by mouth daily.    . hydrALAZINE (APRESOLINE) 100 MG tablet TAKE 1 TABLET (100 MG TOTAL) BY MOUTH 3 (THREE) TIMES DAILY. 270 tablet 1  . labetalol (NORMODYNE) 200 MG tablet TAKE 2 TABLETS (400 MG TOTAL) BY MOUTH 3 (THREE) TIMES DAILY. (Patient taking differently: Take 600 mg by mouth 3 (three) times daily. ) 540 tablet 1  . Multiple Vitamin (MULTIVITAMIN WITH MINERALS) TABS tablet Take 1 tablet by mouth daily as needed.    . ondansetron (ZOFRAN ODT) 4 MG disintegrating tablet Take 1 tablet (4 mg total) by mouth every 8 (eight) hours as needed for nausea or vomiting. 30 tablet 2  . Potassium Gluconate 595 MG CAPS Take 1 capsule by mouth daily.    . PSYLLIUM PO Take 1 tablet by mouth daily.    . traZODone (DESYREL) 100 MG tablet Take 1 tablet (100 mg total) by mouth at bedtime as needed for sleep. 30 tablet 3  . furosemide (LASIX) 20 MG tablet Take 1 tablet (20 mg total) by mouth daily as needed. 30 tablet 3   No facility-administered medications prior to visit.    Allergies  Allergen Reactions  . Lactose Intolerance (Gi) Nausea And Vomiting  . Other Swelling    All beans  . Isosorbide Itching    headaches  . Nitroglycerin Anxiety and Other (See Comments)    Pain and feels like he is on fire    ROS Review of Systems  Constitutional: Negative.   HENT: Negative.   Eyes: Negative.   Respiratory: Negative.   Cardiovascular: Negative.   Gastrointestinal: Negative.   Endocrine: Negative.   Genitourinary: Positive for scrotal swelling (r/t bilateral chronic Hydrocele).  Musculoskeletal: Positive for  arthralgias (generalized joint pain).  Skin: Negative.   Allergic/Immunologic: Negative.   Neurological: Positive for dizziness (occasional ) and headaches (occasional ).  Hematological: Negative.   Psychiatric/Behavioral: Negative.    Objective:    Physical Exam  Constitutional: He is oriented to person, place, and time. He appears well-developed and well-nourished.  HENT:  Head: Normocephalic and atraumatic.  Eyes: Conjunctivae are normal.  Cardiovascular: Normal rate, regular rhythm, normal heart  sounds and intact distal pulses.  Pulmonary/Chest: Effort normal and breath sounds normal.  Abdominal: Soft. Bowel sounds are normal.  Musculoskeletal:        General: Normal range of motion.     Cervical back: Normal range of motion and neck supple.  Neurological: He is alert and oriented to person, place, and time. He has normal reflexes.  Skin: Skin is warm and dry.  Psychiatric: He has a normal mood and affect. Judgment and thought content normal.  Nursing note and vitals reviewed.  BP (!) 147/86   Pulse 64   Temp 98.6 F (37 C) (Oral)   Ht 5\' 11"  (1.803 m)   Wt 196 lb 3.2 oz (89 kg)   SpO2 100%   BMI 27.36 kg/m  Wt Readings from Last 3 Encounters:  11/18/19 196 lb 3.2 oz (89 kg)  09/04/19 198 lb 9.6 oz (90.1 kg)  08/27/19 183 lb 10.3 oz (83.3 kg)     Health Maintenance Due  Topic Date Due  . INFLUENZA VACCINE  05/04/2019    There are no preventive care reminders to display for this patient.  Lab Results  Component Value Date   TSH 1.310 03/01/2018   Lab Results  Component Value Date   WBC 4.2 08/27/2019   HGB 8.9 (L) 08/27/2019   HCT 27.1 (L) 08/27/2019   MCV 91.9 08/27/2019   PLT 220 08/27/2019   Lab Results  Component Value Date   NA 137 08/27/2019   K 3.7 08/27/2019   CO2 23 08/27/2019   GLUCOSE 94 08/27/2019   BUN 61 (H) 08/27/2019   CREATININE 7.08 (H) 08/27/2019   BILITOT <0.2 04/24/2019   ALKPHOS 90 04/24/2019   AST 13 04/24/2019   ALT 8  04/24/2019   PROT 6.7 04/24/2019   ALBUMIN 3.6 08/27/2019   CALCIUM 8.8 (L) 08/27/2019   CALCIUM 8.6 (L) 08/27/2019   ANIONGAP 12 08/27/2019   Lab Results  Component Value Date   CHOL 96 (L) 03/01/2018   Lab Results  Component Value Date   HDL 40 03/01/2018   Lab Results  Component Value Date   LDLCALC 44 03/01/2018   Lab Results  Component Value Date   TRIG 129 08/25/2019   Lab Results  Component Value Date   CHOLHDL 2.4 03/01/2018   Lab Results  Component Value Date   HGBA1C 4.9 03/01/2018   Assessment & Plan:   1. Accelerated hypertension The current medical regimen is effective; blood pressure is stable at 147/89 today; continue present plan and medications as prescribed. He will continue to take medications as prescribed, to decrease high sodium intake, excessive alcohol intake, increase potassium intake, smoking cessation, and increase physical activity of at least 30 minutes of cardio activity daily. He will continue to follow Heart Healthy or DASH diet. - POCT urinalysis dipstick  2. Hypertension, unspecified type  3. Hydrocele, unspecified hydrocele type Stable. We will initiate Motrin today.  - ibuprofen (ADVIL) 800 MG tablet; Take 1 tablet (800 mg total) by mouth every 8 (eight) hours as needed.  Dispense: 30 tablet; Refill: 3  4. Hydrocele, bilateral - US SCROTUM W/DOPPLER; Future - ibuprofen (ADVIL) 800 MG tablet; Take 1 tablet (800 mg total) by mouth every 8 (eight) hours as needed.  Dispense: 30 tablet; Refill: 3  5. Anxiety Stable today.   6. Pain in both testicles - ibuprofen (ADVIL) 800 MG tablet; Take 1 tablet (800 mg total) by mouth every 8 (eight) hours as needed.  Dispense: 30 tablet;  Refill: 3  7. Observed sleep apnea - Ambulatory referral to Sleep Studies - Split night study; Future  8. Follow up He will follow up in 3 months.   Meds ordered this encounter  Medications  . ibuprofen (ADVIL) 800 MG tablet    Sig: Take 1 tablet (800  mg total) by mouth every 8 (eight) hours as needed.    Dispense:  30 tablet    Refill:  3    Orders Placed This Encounter  Procedures  . US SCROTUM W/DOPPLER  . Ambulatory referral to Sleep Studies  . POCT urinalysis dipstick  . Split night study     Referral Orders     Ambulatory referral to Sleep Studies   Kathe Becton,  MSN, FNP-BC [address] [place], [place] [address] [phone]- fax   Problem List Items Addressed This Visit      Cardiovascular and Mediastinum   Hypertension     Genitourinary   Hydrocele   Relevant Medications   ibuprofen (ADVIL) 800 MG tablet     Other   Anxiety    Other Visit Diagnoses    Accelerated hypertension    -  Primary   Relevant Orders   POCT urinalysis dipstick (Completed)   Hydrocele, bilateral       Relevant Medications   ibuprofen (ADVIL) 800 MG tablet   Other Relevant Orders   US SCROTUM W/DOPPLER   Pain in both testicles       Relevant Medications   ibuprofen (ADVIL) 800 MG tablet   Observed sleep apnea       Relevant Orders   Ambulatory referral to Sleep Studies   Split night study   Follow up          Meds ordered this encounter  Medications  . ibuprofen (ADVIL) 800 MG tablet    Sig: Take 1 tablet (800 mg total) by mouth every 8 (eight) hours as needed.    Dispense:  30 tablet    Refill:  3    Follow-up: Return in about 3 months (around 02/15/2020).    [name], FNP

## 2019-11-19 DIAGNOSIS — N50812 Left testicular pain: Secondary | ICD-10-CM | POA: Insufficient documentation

## 2019-11-19 DIAGNOSIS — N50811 Right testicular pain: Secondary | ICD-10-CM | POA: Insufficient documentation

## 2019-11-26 ENCOUNTER — Ambulatory Visit (HOSPITAL_COMMUNITY): Payer: Medicaid Other

## 2019-11-27 ENCOUNTER — Telehealth: Payer: Self-pay

## 2019-11-27 NOTE — Telephone Encounter (Signed)
Scrotum US & Doppler approved. Appointment 11/29/2019 at 2:15 at [facility]. Patient's mother given information. Advised to call and reschedule in advance,  if needed.

## 2019-11-29 ENCOUNTER — Other Ambulatory Visit: Payer: Self-pay

## 2019-11-29 ENCOUNTER — Ambulatory Visit (HOSPITAL_COMMUNITY)
Admission: RE | Admit: 2019-11-29 | Discharge: 2019-11-29 | Disposition: A | Discharge status: Home / Self Care | Payer: Medicaid Other | Source: Ambulatory Visit | Attending: Family Medicine | Admitting: Family Medicine

## 2019-11-29 DIAGNOSIS — N433 Hydrocele, unspecified: Secondary | ICD-10-CM | POA: Diagnosis present

## 2019-12-03 ENCOUNTER — Telehealth: Payer: Self-pay | Admitting: Family Medicine

## 2019-12-03 NOTE — Telephone Encounter (Signed)
Pt's mother called wanting to know son's test results. Please give her a call back at [phone].

## 2019-12-09 ENCOUNTER — Other Ambulatory Visit (HOSPITAL_COMMUNITY)
Admission: RE | Admit: 2019-12-09 | Discharge: 2019-12-09 | Disposition: A | Discharge status: Home / Self Care | Payer: Medicaid Other | Source: Ambulatory Visit | Attending: Internal Medicine | Admitting: Internal Medicine

## 2019-12-09 DIAGNOSIS — Z20822 Contact with and (suspected) exposure to covid-19: Secondary | ICD-10-CM | POA: Diagnosis not present

## 2019-12-09 DIAGNOSIS — Z01812 Encounter for preprocedural laboratory examination: Secondary | ICD-10-CM | POA: Insufficient documentation

## 2019-12-10 LAB — SARS CORONAVIRUS 2 (TAT 6-24 HRS): SARS Coronavirus 2: NEGATIVE

## 2019-12-11 ENCOUNTER — Other Ambulatory Visit: Payer: Self-pay

## 2019-12-11 ENCOUNTER — Ambulatory Visit (HOSPITAL_BASED_OUTPATIENT_CLINIC_OR_DEPARTMENT_OTHER): Payer: Medicaid Other | Attending: Family Medicine | Admitting: Internal Medicine

## 2019-12-11 VITALS — Temp 98.9°F | Ht 71.0 in | Wt 197.2 lb

## 2019-12-11 DIAGNOSIS — Q288 Other specified congenital malformations of circulatory system: Secondary | ICD-10-CM | POA: Diagnosis not present

## 2019-12-11 DIAGNOSIS — I110 Hypertensive heart disease with heart failure: Secondary | ICD-10-CM | POA: Diagnosis not present

## 2019-12-11 DIAGNOSIS — I509 Heart failure, unspecified: Secondary | ICD-10-CM | POA: Insufficient documentation

## 2019-12-11 DIAGNOSIS — G4733 Obstructive sleep apnea (adult) (pediatric): Secondary | ICD-10-CM | POA: Insufficient documentation

## 2019-12-11 DIAGNOSIS — Z79899 Other long term (current) drug therapy: Secondary | ICD-10-CM | POA: Diagnosis not present

## 2019-12-11 DIAGNOSIS — G4730 Sleep apnea, unspecified: Secondary | ICD-10-CM | POA: Insufficient documentation

## 2019-12-13 ENCOUNTER — Other Ambulatory Visit: Payer: Self-pay | Admitting: Family Medicine

## 2019-12-13 DIAGNOSIS — N433 Hydrocele, unspecified: Secondary | ICD-10-CM

## 2019-12-14 ENCOUNTER — Other Ambulatory Visit: Payer: Self-pay | Admitting: Family Medicine

## 2019-12-18 ENCOUNTER — Other Ambulatory Visit: Payer: Self-pay | Admitting: Family Medicine

## 2019-12-18 DIAGNOSIS — N433 Hydrocele, unspecified: Secondary | ICD-10-CM

## 2019-12-18 DIAGNOSIS — G4730 Sleep apnea, unspecified: Secondary | ICD-10-CM

## 2019-12-21 DIAGNOSIS — G4730 Sleep apnea, unspecified: Secondary | ICD-10-CM | POA: Diagnosis not present

## 2019-12-21 NOTE — Procedures (Signed)
Patient Name: Stanley [name], Stanley [name] Date: 12/11/2019 Gender: Male D.O.B: 1976/06/30 Age (years): 43 Referring Provider: [name] FNP Height (inches): 71 Interpreting Physician: Baird Lyons MD, ABSM Weight (lbs): 197 RPSGT: Laren Everts BMI: 27 MRN: [id number] Neck Size: 16.00  CLINICAL INFORMATION Sleep Study Type: NPSG Indication for sleep study: Congestive Heart Failure, Excessive Daytime Sleepiness, Fatigue, Hypertension, Morning Headaches, Sleep walking/talking/parasomnias, Snoring, Witnessed Apneas Epworth Sleepiness Score: 12  SLEEP STUDY TECHNIQUE As per the AASM Manual for the Scoring of Sleep and Associated Events v2.3 (April 2016) with a hypopnea requiring 4% desaturations.  The channels recorded and monitored were frontal, central and occipital EEG, electrooculogram (EOG), submentalis EMG (chin), nasal and oral airflow, thoracic and abdominal wall motion, anterior tibialis EMG, snore microphone, electrocardiogram, and pulse oximetry.  MEDICATIONS Medications self-administered by patient taken the night of the study : CLONIDINE, ATORVASTATIN, HYDRALAZINE, TRAZODONE, DOXAZOSIN MESYLATE, LABETALOL  SLEEP ARCHITECTURE The study was initiated at 11:08:09 PM and ended at 5:21:57 AM.  Sleep onset time was 23.0 minutes and the sleep efficiency was 67.4%%. The total sleep time was 252 minutes.  Stage REM latency was 260.0 minutes.  The patient spent 36.3%% of the night in stage N1 sleep, 55.4%% in stage N2 sleep, 0.0%% in stage N3 and 8.3% in REM.  Alpha intrusion was absent.  Supine sleep was 32.24%.  RESPIRATORY PARAMETERS The overall apnea/hypopnea index (AHI) was 64.0 per hour. There were 264 total apneas, including 147 obstructive, 116 central and 1 mixed apneas. There were 5 hypopneas and 79 RERAs.  The AHI during Stage REM sleep was 17.1 per hour.  AHI while supine was 149.2 per hour.  The mean oxygen saturation was 96.1%. The minimum SpO2 during  sleep was 92.0%.  moderate snoring was noted during this study.  CARDIAC DATA The 2 lead EKG demonstrated sinus rhythm. The mean heart rate was 63.8 beats per minute. Other EKG findings include: None.  LEG MOVEMENT DATA The total PLMS were 0 with a resulting PLMS index of 0.0. Associated arousal with leg movement index was 0.2 .  IMPRESSIONS - Severe obstructive sleep apnea occurred during this study (AHI = 64.0/h). - Moderate central sleep apnea occurred during this study (CAI = 27.6/h). - The patient had minimal or no oxygen desaturation during the study (Min O2 = 92.0%) - The patient snored with moderate snoring volume. - No cardiac abnormalities were noted during this study. - Clinically significant periodic limb movements did not occur during sleep. No significant associated arousals.  DIAGNOSIS - Obstructive Sleep Apnea (327.23 [G47.33 ICD-10]) - Central Sleep Apnea (327.27 [G47.37 ICD-10])  RECOMMENDATIONS - Sugest CPAP titration sleep study or autopap. Central apneas may resolve. - BiPAP or ASV titration may be required to eliminate residual central sleep apnea, if symptomatic and clinically significant.. - Be careful with alcohol, sedatives and other CNS depressants that may worsen sleep apnea and disrupt normal sleep architecture. - Sleep hygiene should be reviewed to assess factors that may improve sleep quality. - Weight management and regular exercise should be initiated or continued if appropriate.  [Electronically signed] 12/21/2019 03:07 PM  Baird Lyons MD, [place], American Board of Sleep Medicine   NPI: [id number]                         [facility], [facility]  ELECTRONICALLY SIGNED ON:  12/21/2019, 3:02 PM [place]: [phone]   FX: [phone]  [place] [facility]

## 2019-12-23 ENCOUNTER — Telehealth: Payer: Self-pay

## 2019-12-23 NOTE — Telephone Encounter (Signed)
Msg left on voicemail:  Patient is positive for Sleep Apnea. Please inform patient and assess if she needs assistance with obtaining CPAP. Sleep hygiene should include: avoiding alcohol and medications that are sedative, as these may interfere with sleep. Do not watch TV in bed, avoid eating, and beverages high in caffeine 2 hours prior to sleep. Patient should also get regular exercise and maintain a health weight.

## 2019-12-25 ENCOUNTER — Other Ambulatory Visit: Payer: Self-pay | Admitting: Family Medicine

## 2019-12-25 ENCOUNTER — Other Ambulatory Visit: Payer: Self-pay

## 2019-12-25 DIAGNOSIS — I10 Essential (primary) hypertension: Secondary | ICD-10-CM

## 2019-12-25 MED ORDER — LABETALOL HCL 200 MG PO TABS: 400.0000 mg | ORAL_TABLET | Freq: Three times a day (TID) | ORAL | 0 refills | Status: DC

## 2019-12-25 MED FILL — LABETALOL HCL 200 MG TABS: 200 | 30 days supply | Qty: 180 | Fill #0

## 2019-12-25 NOTE — Telephone Encounter (Signed)
This medication was refilled today for 1 month with no refills by Kathe Becton, NP with a note - pt must f/u with Dr Marlou Porch for further refills.  He will need to be scheduled for an appt with Dr Marlou Porch.

## 2019-12-25 NOTE — Telephone Encounter (Signed)
In review of pt's chart, I can not find any documentation where pt was instructed to increase labetalol 200 mg - take 3 tablets TID.  He is supposed to take 200 mg (2) tablets TID.  Will forward to Dr Marlou Porch for his review and any further orders.

## 2019-12-25 NOTE — Telephone Encounter (Signed)
Pt does have an appt with Dr. Marlou Porch on 03/04/20, pt's mother made the appt today and wanted the medication prescribed how pt takes it, 3 tablet TID, does Dr. Marlou Porch want to refill this medication as requested until appt time. Please address

## 2019-12-25 NOTE — Telephone Encounter (Signed)
Pt's mother is calling requesting a refill on labetalol 200 mg tablets, stating that pt takes 3 tablets TID. These directions are not on pt's medication list. Would Dr. Marlou Porch like to refill this medication for pt taking 3 tablets TID? Please address

## 2019-12-26 MED ORDER — LABETALOL HCL 200 MG PO TABS: 600.0000 mg | ORAL_TABLET | Freq: Three times a day (TID) | ORAL | 1 refills | Status: DC

## 2019-12-26 NOTE — Telephone Encounter (Signed)
OK to give labetalol 600mg  TID or 200mg  -3 tabs TID Please have him see HTN clinic as well. Candee Furbish, MD

## 2019-12-27 ENCOUNTER — Telehealth: Payer: Self-pay | Admitting: Family Medicine

## 2019-12-27 NOTE — Telephone Encounter (Signed)
[facility] Wants to know what pressure is needed for the auto titration faxed over for pt. It was hard to see. Please contact them back. However, a number was not left.

## 2020-01-01 NOTE — Telephone Encounter (Signed)
Left message on pt voicemail for him to return call. Will schedule patient in HTN clinic for first available appointment.

## 2020-01-02 ENCOUNTER — Telehealth: Payer: Self-pay | Admitting: Pharmacist

## 2020-01-02 NOTE — Telephone Encounter (Signed)
Called pt and left message. Called house # and spoke with his mother who schedules his appts. Scheduled HTN f/u on 4/6 at 2:30pm.

## 2020-01-02 NOTE — Telephone Encounter (Signed)
-----   Message from Ramond Dial, [facility] sent at 01/01/2020  9:20 AM EDT ----- Regarding: RE: pt needs to be called and scheduled HTN clinic appt per Dr. Laverda Sorenson and left message on VM to schedule ----- Message ----- From: Nuala Alpha, LPN Sent: 1/55/2080   9:02 AM EDT To: Shellia Cleverly, RN, Leeroy Bock, RPH, # Subject: pt needs to be called and scheduled HTN clin#  As mentioned below, Dr. Marlou Porch wanted this pt to start Labetalol 600 mg po TID, which was called in by a triage nurse, and then he wanted the pt to see HTN clinic at very next available.  He sent the message to [facility], but she has been off this week.  Can you please call the pt and schedule him a HTN Clinic appt with Pharmacist, for recent changes in BP meds?  He is aware you will call. Just send the date to [facility] basket.    Thanks so much, Alferd Patee, Thana Farr, MD  Shellia Cleverly, RN 6 days ago  OK to give labetalol 600mg  TID or 200mg  -3 tabs TID Please have him see HTN clinic as well. Candee Furbish, MD

## 2020-01-02 NOTE — Telephone Encounter (Signed)
RE: pt needs to be called and scheduled HTN clinic appt per Dr. Marlou Porch Received: Today Message Contents  Supple, Harlon Flor, RPH  [place], Carollee Massed, RPH; Nuala Alpha, LPN; P [facility] [address] [place]; [facility]  Cc: Shellia Cleverly, RN  He's scheduled for 4/6 at 2:30 with the PharmD to discuss his BP.   Thanks, [employer]

## 2020-01-03 ENCOUNTER — Ambulatory Visit: Payer: Self-pay | Admitting: Family Medicine

## 2020-01-07 ENCOUNTER — Ambulatory Visit: Payer: Medicaid Other

## 2020-01-07 NOTE — Progress Notes (Deleted)
Patient ID: Stanley [name]                 DOB: 1976/07/28                      MRN: [id number]     HPI: Stanley [name] is a 44 y.o. male referred by Dr. Marlou Porch to HTN clinic. PMH is significant for hypertensive emergency, LVH, chest pain, CKD stage IV, stroke, HLD, tobacco abuse, and anxiety/depression.  Patient was being followed by HTN clinic last summer. It was noted that his scr was significantly elevated in July 2020. We attempted to contact patient and his nephrologist. Patient did not return any of our phone calls. We sent lab work to Dr. [name] office and called to follow up. They stated patient was no longer a patient of Dr. Elissa Hefty due to multiple prior no shows. He saw Dr. Clovis Riley on 05/31/2019 and was referred to ER for hypertension but refused to go. Appears he did see nephrology on 07/08/2019. He was admitted to the ICU on 08/23/2019 for hypertensive emergency, CKD, epistaxis, secondary hyperparathyroidism. Nephrology recommended he have perm cath placed as there was concern he would need HD. Patient refused stating he needed to discuss with his "primary nephrologist" and patient left hospital AMA. Patient has followed up with the primary care physician several times since then. Unsure if he has seen nephrology since admission, and he has been prescribed high doses of NSAIDS and bactrim (not dose reduced) on multiple occassions.  Has recently been diagnosed with sleep apnea   Do not take NSAIDS Wearing CPAP? Following with nephrology? Amlodipine?  Home BP readings: Pt did not bring BP log.   Current HTN meds: clonidine 0.3mg  TID, furosemide 20mg  prn - taking daily in AM, hydralazine 100mg  TID, labetalol 600mg  TID, doxazosin 8mg  at bedtime  Previously tried: isosorbide (headaches)  BP goal: <130/63mmHg  Family History: The patient's family history includes Diabetes type II in his brother; Hypertension in his brother and mother.  Social History: Currently smokes 1 PPD, denies  alcohol use, occasionally uses marijuana.  Diet: Breakfast - [place] sausage, eggs, oranges. Lunch - sandwich. Eats a lot of vegetables. Dinner - meat, starch, veggie. Only eats beef 2x a month, mostly eats chicken, veal, or [place]. Does not add any salt to food, mom buys foods with < 10% sodium. Caffeine is limited to 1 cup of coffee each day, occasionally sweet tea.  Exercise: Limited - medication makes him very tired/weak.  Wt Readings from Last 3 Encounters:  12/11/19 197 lb 3.2 oz (89.4 kg)  11/18/19 196 lb 3.2 oz (89 kg)  09/04/19 198 lb 9.6 oz (90.1 kg)   BP Readings from Last 3 Encounters:  11/18/19 (!) 147/86  09/04/19 (!) 164/94  08/27/19 (!) 141/87   Pulse Readings from Last 3 Encounters:  11/18/19 64  09/04/19 62  08/27/19 60    Renal function: CrCl cannot be calculated (Patient's most recent lab result is older than the maximum 21 days allowed.).  Past Medical History:  Diagnosis Date  . Anxiety   . CKD (chronic kidney disease), stage IV ([place])   . Depression   . Dyslexia   . Heart palpitations   . High cholesterol   . Hypertension   . Hypertensive urgency   . Polysubstance (including opioids) dependence, binge pattern ([place]) 08/2020  . Stroke ([place]) 06/03/2017   "minor; completely recovered today" (06/13/2017)  . Tobacco abuse     Current Outpatient Medications  on File Prior to Visit  Medication Sig Dispense Refill  . acetaminophen (TYLENOL) 500 MG tablet Take 1,000 mg by mouth as needed for mild pain.    Marland Kitchen albuterol (VENTOLIN HFA) 108 (90 Base) MCG/ACT inhaler Inhale 2 puffs into the lungs every 4 (four) hours as needed for wheezing or shortness of breath (cough, shortness of breath or wheezing.). 8 g 12  . aspirin EC 81 MG tablet Take 81 mg by mouth daily.    Marland Kitchen atorvastatin (LIPITOR) 40 MG tablet TAKE 1 TABLET (40 MG TOTAL) BY MOUTH DAILY AT 6 PM. 90 tablet 0  . cloNIDine (CATAPRES) 0.3 MG tablet TAKE 1 TABLET (0.3 MG TOTAL) BY MOUTH 3 (THREE) TIMES DAILY.  270 tablet 1  . doxazosin (CARDURA) 8 MG tablet Take 1 tablet (8 mg total) by mouth daily. 30 tablet 11  . folic acid (FOLVITE) 329 MCG tablet Take 400 mcg by mouth daily.    . furosemide (LASIX) 20 MG tablet Take 1 tablet (20 mg total) by mouth daily as needed. 30 tablet 3  . hydrALAZINE (APRESOLINE) 100 MG tablet TAKE 1 TABLET (100 MG TOTAL) BY MOUTH 3 (THREE) TIMES DAILY. 270 tablet 1  . ibuprofen (ADVIL) 800 MG tablet Take 1 tablet (800 mg total) by mouth every 8 (eight) hours as needed. 30 tablet 3  . labetalol (NORMODYNE) 200 MG tablet Take 3 tablets (600 mg total) by mouth 3 (three) times daily. 270 tablet 1  . Multiple Vitamin (MULTIVITAMIN WITH MINERALS) TABS tablet Take 1 tablet by mouth daily as needed.    . ondansetron (ZOFRAN ODT) 4 MG disintegrating tablet Take 1 tablet (4 mg total) by mouth every 8 (eight) hours as needed for nausea or vomiting. 30 tablet 2  . Potassium Gluconate 595 MG CAPS Take 1 capsule by mouth daily.    . PSYLLIUM PO Take 1 tablet by mouth daily.    . traZODone (DESYREL) 100 MG tablet Take 1 tablet (100 mg total) by mouth at bedtime as needed for sleep. 30 tablet 3   No current facility-administered medications on file prior to visit.    Allergies  Allergen Reactions  . Lactose Intolerance (Gi) Nausea And Vomiting  . Other Swelling    All beans  . Isosorbide Itching    headaches  . Nitroglycerin Anxiety and Other (See Comments)    Pain and feels like he is on fire     Assessment/Plan:  1. Hypertension - BP remains above goal <130/69mmHg, but significant improvement from past visits.  Pt did not bring in home BP readings, however, at today's visit BP was 142/89 mmHg. Pt is currently taking amlodipine 10mg  daily, clonidine 0.3mg  TID, furosemide 20mg  prn - taking daily in AM, hydralazine 100mg  TID, labetalol 400mg  TID, Toprol 50mg  daily, doxazosin 4mg  at bedtime. Recommend increasing doxazosin from 4 mg daily to 8 mg daily. Will f/u with pt pm 05/21/19  to determine effects of dosage increase on BP. If possible in future visits would like to get patient off of two beta blockers as this is most likely contributing to his fatigue and in ability to exercise.  2.Tobacco abuse - Pt has made improvements with smoking (1 pack/2 days to 1 pack/week). Encouraged pt for his success. Pt hasnt been using nicotine patch daily. Encouraged patient to use nicotine patch daily and not buy any more cigarettes. He has been unable to obtain nicotine gum. Pt is interested in using gum and stated he is willing to try again to obtain  from pharmacy. Will f/u with pt regarding smoking cessation at f/u visit.   Patient seen by Drexel Iha, PharmD, PGY2 Resident in conjunction with:  Ramond Dial, Pharm.D, [facility]  [address], [place], [place] [address]  Phone: [phone]; Fax: [phone]  01/07/2020 7:46 AM

## 2020-01-14 ENCOUNTER — Ambulatory Visit: Payer: Medicaid Other

## 2020-01-14 NOTE — Progress Notes (Deleted)
Patient ID: Stanley [name]                 DOB: 01/05/1976                      MRN: [id number]     HPI: Stanley [name] is a 44 y.o. male referred by Dr. Marlou [name] to HTN clinic. PMH is significant for hypertensive emergency, LVH, chest pain, CKD stage IV, stroke, HLD, tobacco abuse, and anxiety/depression.  Patient was being followed by HTN clinic last summer. It was noted that his scr was significantly elevated in July 2020. We attempted to contact patient and his nephrologist. Patient did not return any of our phone calls. We sent lab work to Dr. Marval [name] office and called to follow up. They stated patient was no longer a patient of Dr. Elissa [name] due to multiple prior no shows. He saw Dr. Clovis [name] on 05/31/2019 and was referred to ER for hypertension but refused to go. Appears he did see nephrology on 07/08/2019. He was admitted to the ICU on 08/23/2019 for hypertensive emergency, CKD, epistaxis, secondary hyperparathyroidism. Nephrology recommended he have perm cath placed as there was concern he would need HD. Patient refused stating he needed to discuss with his "primary nephrologist" and patient left hospital AMA. Patient has followed up with the primary care physician several times since then. Unsure if he has seen nephrology since admission, and he has been prescribed high doses of NSAIDS and bactrim (not dose reduced) on multiple occassions.  Has recently been diagnosed with sleep apnea   Do not take NSAIDS Wearing CPAP? Following with nephrology? Amlodipine? Dizziness, lightheadedness, headache, blurred vision, SOB, swelling  Home BP readings: Pt did not bring BP log.   Current HTN meds: clonidine 0.3mg  TID, furosemide 20mg  prn - taking daily in AM, hydralazine 100mg  TID, labetalol 600mg  TID, doxazosin 8mg  at bedtime  Previously tried: isosorbide (headaches)  BP goal: <130/76mmHg  Family History: The patient's family history includes Diabetes type II in his brother; Hypertension in his  brother and mother.  Social History: Currently smokes 1 PPD, denies alcohol use, occasionally uses marijuana.  Diet: Breakfast - [place] sausage, eggs, oranges. Lunch - sandwich. Eats a lot of vegetables. Dinner - meat, starch, veggie. Only eats beef 2x a month, mostly eats chicken, veal, or [place]. Does not add any salt to food, mom buys foods with < 10% sodium. Caffeine is limited to 1 cup of coffee each day, occasionally sweet tea.  Exercise: Limited - medication makes him very tired/weak.  Wt Readings from Last 3 Encounters:  12/11/19 197 lb 3.2 oz (89.4 kg)  11/18/19 196 lb 3.2 oz (89 kg)  09/04/19 198 lb 9.6 oz (90.1 kg)   BP Readings from Last 3 Encounters:  11/18/19 (!) 147/86  09/04/19 (!) 164/94  08/27/19 (!) 141/87   Pulse Readings from Last 3 Encounters:  11/18/19 64  09/04/19 62  08/27/19 60    Renal function: CrCl cannot be calculated (Patient's most recent lab result is older than the maximum 21 days allowed.).  Past Medical History:  Diagnosis Date  . Anxiety   . CKD (chronic kidney disease), stage IV ([place])   . Depression   . Dyslexia   . Heart palpitations   . High cholesterol   . Hypertension   . Hypertensive urgency   . Polysubstance (including opioids) dependence, binge pattern ([place]) 08/2020  . Stroke ([place]) 06/03/2017   "minor; completely recovered today" (06/13/2017)  . Tobacco abuse  Current Outpatient Medications on File Prior to Visit  Medication Sig Dispense Refill  . acetaminophen (TYLENOL) 500 MG tablet Take 1,000 mg by mouth as needed for mild pain.    Marland Kitchen albuterol (VENTOLIN HFA) 108 (90 Base) MCG/ACT inhaler Inhale 2 puffs into the lungs every 4 (four) hours as needed for wheezing or shortness of breath (cough, shortness of breath or wheezing.). 8 g 12  . aspirin EC 81 MG tablet Take 81 mg by mouth daily.    Marland Kitchen atorvastatin (LIPITOR) 40 MG tablet TAKE 1 TABLET (40 MG TOTAL) BY MOUTH DAILY AT 6 PM. 90 tablet 0  . cloNIDine (CATAPRES) 0.3 MG  tablet TAKE 1 TABLET (0.3 MG TOTAL) BY MOUTH 3 (THREE) TIMES DAILY. 270 tablet 1  . doxazosin (CARDURA) 8 MG tablet Take 1 tablet (8 mg total) by mouth daily. 30 tablet 11  . folic acid (FOLVITE) 300 MCG tablet Take 400 mcg by mouth daily.    . furosemide (LASIX) 20 MG tablet Take 1 tablet (20 mg total) by mouth daily as needed. 30 tablet 3  . hydrALAZINE (APRESOLINE) 100 MG tablet TAKE 1 TABLET (100 MG TOTAL) BY MOUTH 3 (THREE) TIMES DAILY. 270 tablet 1  . ibuprofen (ADVIL) 800 MG tablet Take 1 tablet (800 mg total) by mouth every 8 (eight) hours as needed. 30 tablet 3  . labetalol (NORMODYNE) 200 MG tablet Take 3 tablets (600 mg total) by mouth 3 (three) times daily. 270 tablet 1  . Multiple Vitamin (MULTIVITAMIN WITH MINERALS) TABS tablet Take 1 tablet by mouth daily as needed.    . ondansetron (ZOFRAN ODT) 4 MG disintegrating tablet Take 1 tablet (4 mg total) by mouth every 8 (eight) hours as needed for nausea or vomiting. 30 tablet 2  . Potassium Gluconate 595 MG CAPS Take 1 capsule by mouth daily.    . PSYLLIUM PO Take 1 tablet by mouth daily.    . traZODone (DESYREL) 100 MG tablet Take 1 tablet (100 mg total) by mouth at bedtime as needed for sleep. 30 tablet 3   No current facility-administered medications on file prior to visit.    Allergies  Allergen Reactions  . Lactose Intolerance (Gi) Nausea And Vomiting  . Other Swelling    All beans  . Isosorbide Itching    headaches  . Nitroglycerin Anxiety and Other (See Comments)    Pain and feels like he is on fire     Assessment/Plan:  1. Hypertension -   2.Tobacco abuse -   Stanley [name], Pharm.D, [facility] [facility]  [address], [place], [place] [address]  Phone: [phone]; Fax: [phone]  01/14/2020 7:55 AM

## 2020-01-16 ENCOUNTER — Other Ambulatory Visit: Payer: Self-pay | Admitting: Cardiology

## 2020-01-24 ENCOUNTER — Other Ambulatory Visit: Payer: Self-pay | Admitting: Cardiology

## 2020-01-24 DIAGNOSIS — I10 Essential (primary) hypertension: Secondary | ICD-10-CM

## 2020-01-27 ENCOUNTER — Telehealth: Payer: Self-pay | Admitting: Family Medicine

## 2020-01-27 NOTE — Telephone Encounter (Signed)
Pt's mother wants to know if he can get covid shot

## 2020-01-27 NOTE — Telephone Encounter (Signed)
Stanley [name] is calling for medication refills.   Please review and confirm Rx notes for Stanley [name]. Patient need to keep his appointments for refills with Dr Marlou [name].  Please advise.

## 2020-01-27 NOTE — Telephone Encounter (Signed)
Pts mother wants to know more info regard to sleep study machinery. Can you please call mother back for more details

## 2020-01-28 NOTE — Telephone Encounter (Signed)
Stanley [name] called to check on the status for Stanley [name]'s CPAP. I called [facility] and the case was closed. Closed because [employer] could not reach the patient.   [employer] will reach out to the team to see if the case can be reopened. Stanley [name] has been advised that they will be in contact. And to please answer the call.

## 2020-01-29 ENCOUNTER — Telehealth: Payer: Self-pay | Admitting: Family Medicine

## 2020-01-29 NOTE — Telephone Encounter (Signed)
Stanley [name] c/o rectal bleeding. He said it started with a headache & stomach pain 2 days ago.  He had a BM noticed blood on his  tissue. Stool are formed and hard to get moving.  Per patient it only happen one time.   He has been advised to go the ED, if problem continues. Stay well hydrated, eat plenty of fruits and veggies. If a stool softener is needed ask the pharmacist (HTN).   Patient next visit is 02/10/2020

## 2020-02-03 ENCOUNTER — Telehealth: Payer: Self-pay | Admitting: Pharmacist

## 2020-02-03 NOTE — Telephone Encounter (Signed)
Patient canceled his appointment with PharmD on 4/6 and no showed to appointment on 4/13. I called and left VM on machine for patient to call back and reschedule.

## 2020-02-06 ENCOUNTER — Telehealth: Payer: Self-pay

## 2020-02-06 NOTE — Telephone Encounter (Signed)
Per [employer] multiple attempts have been made to reach out [name]. The calls go to voicemail. I tried to reach out myself, again today. All contacts go to voicemail. (He can call [employer] for further assistance on [phone]).

## 2020-02-10 ENCOUNTER — Ambulatory Visit: Payer: Medicaid Other | Admitting: Family Medicine

## 2020-02-17 ENCOUNTER — Other Ambulatory Visit: Payer: Self-pay

## 2020-02-17 ENCOUNTER — Encounter: Payer: Self-pay | Admitting: Family Medicine

## 2020-02-17 ENCOUNTER — Ambulatory Visit: Payer: Medicaid Other | Admitting: Family Medicine

## 2020-02-17 ENCOUNTER — Other Ambulatory Visit: Payer: Self-pay | Admitting: Family Medicine

## 2020-02-17 ENCOUNTER — Ambulatory Visit (INDEPENDENT_AMBULATORY_CARE_PROVIDER_SITE_OTHER): Payer: Medicaid Other | Admitting: Family Medicine

## 2020-02-17 VITALS — BP 242/110 | HR 63 | Temp 98.6°F | Ht 71.0 in | Wt 201.6 lb

## 2020-02-17 DIAGNOSIS — Z0000 Encounter for general adult medical examination without abnormal findings: Secondary | ICD-10-CM

## 2020-02-17 DIAGNOSIS — N433 Hydrocele, unspecified: Secondary | ICD-10-CM

## 2020-02-17 DIAGNOSIS — I10 Essential (primary) hypertension: Secondary | ICD-10-CM

## 2020-02-17 DIAGNOSIS — R8290 Unspecified abnormal findings in urine: Secondary | ICD-10-CM | POA: Diagnosis not present

## 2020-02-17 DIAGNOSIS — Z09 Encounter for follow-up examination after completed treatment for conditions other than malignant neoplasm: Secondary | ICD-10-CM

## 2020-02-17 DIAGNOSIS — I509 Heart failure, unspecified: Secondary | ICD-10-CM

## 2020-02-17 DIAGNOSIS — G4700 Insomnia, unspecified: Secondary | ICD-10-CM

## 2020-02-17 DIAGNOSIS — R42 Dizziness and giddiness: Secondary | ICD-10-CM

## 2020-02-17 LAB — POCT URINALYSIS DIPSTICK
Bilirubin, UA: NEGATIVE
Blood, UA: NEGATIVE
Glucose, UA: NEGATIVE
Ketones, UA: NEGATIVE
Nitrite, UA: NEGATIVE
Protein, UA: POSITIVE — AB
Spec Grav, UA: 1.025 (ref 1.010–1.025)
Urobilinogen, UA: 0.2 E.U./dL
pH, UA: 6 (ref 5.0–8.0)

## 2020-02-17 LAB — GLUCOSE, POCT (MANUAL RESULT ENTRY): POC Glucose: 88 mg/dl (ref 70–99)

## 2020-02-17 NOTE — Telephone Encounter (Signed)
Is this okay to refill? 

## 2020-02-17 NOTE — Progress Notes (Signed)
Patient [facility]   Established Patient Office Visit  Subjective:  Patient ID: Stanley [name], male    DOB: 05-24-1976  Age: 44 y.o. MRN: [id number]  CC:  Chief Complaint  Patient presents with  . Follow-up    HTN    HPI Stanley [name] is a 44 year old male who presents for Follow Up today.    Past Medical History:  Diagnosis Date  . Anxiety   . CKD (chronic kidney disease), stage IV ([place])   . Depression   . Dyslexia   . Heart palpitations   . High cholesterol   . Hypertension   . Hypertensive urgency   . Polysubstance (including opioids) dependence, binge pattern ([place]) 08/2020  . Stroke ([place]) 06/03/2017   "minor; completely recovered today" (06/13/2017)  . Tobacco abuse    Current Status: Since his last office visit, he is doing well with no complaints. His blood pressures are elevated today. She states that she is taking all medications as prescribed. He was scheduled to see Nephrology today. He denies visual changes, chest pain, cough, shortness of breath, heart palpitations, and falls. He has occasional headaches and dizziness with position changes. Denies severe headaches, confusion, seizures, double vision, and blurred vision, nausea and vomiting. He denies fevers, chills, fatigue, recent infections, weight loss, and night sweats.  Denies GI problems such as diarrhea, and constipation. He  has no reports of blood in stools, dysuria and hematuria. No depression or anxiety reported today. Hedenies suicidal ideations, homicidal ideations, or auditory hallucinations. He is taking all medications as prescribed. He denies pain today.   Past Surgical History:  Procedure Laterality Date  . NO PAST SURGERIES      Family History  Problem Relation Age of Onset  . Hypertension Mother   . Hypertension Brother   . Diabetes type II Brother     Social History   Socioeconomic History  . Marital status: Married    Spouse name: Not on file   . Number of children: Not on file  . Years of education: Not on file  . Highest education level: Not on file  Occupational History  . Not on file  Tobacco Use  . Smoking status: Current Every Day Smoker    Packs/day: 1.00    Years: 30.00    Pack years: 30.00    Types: Cigarettes  . Smokeless tobacco: Never Used  Substance and Sexual Activity  . Alcohol use: No  . Drug use: Yes    Types: Marijuana    Comment: 06/13/2017 "not often"  . Sexual activity: Not Currently  Other Topics Concern  . Not on file  Social History Narrative  . Not on file   Social Determinants of Health   Financial Resource Strain:   . Difficulty of Paying Living Expenses:   Food Insecurity:   . Worried About Charity fundraiser in the Last Year:   . Arboriculturist in the Last Year:   Transportation Needs:   . Film/video editor (Medical):   Marland Kitchen Lack of Transportation (Non-Medical):   Physical Activity:   . Days of Exercise per Week:   . Minutes of Exercise per Session:   Stress:   . Feeling of Stress :   Social Connections:   . Frequency of Communication with Friends and Family:   . Frequency of Social Gatherings with Friends and Family:   . Attends Religious Services:   . Active Member of  Clubs or Organizations:   . Attends Archivist Meetings:   Marland Kitchen Marital Status:   Intimate Partner Violence:   . Fear of Current or Ex-Partner:   . Emotionally Abused:   Marland Kitchen Physically Abused:   . Sexually Abused:     Outpatient Medications Prior to Visit  Medication Sig Dispense Refill  . acetaminophen (TYLENOL) 500 MG tablet Take 1,000 mg by mouth as needed for mild pain.    Marland Kitchen albuterol (VENTOLIN HFA) 108 (90 Base) MCG/ACT inhaler Inhale 2 puffs into the lungs every 4 (four) hours as needed for wheezing or shortness of breath (cough, shortness of breath or wheezing.). 8 g 12  . aspirin EC 81 MG tablet Take 81 mg by mouth daily.    Marland Kitchen atorvastatin (LIPITOR) 40 MG tablet TAKE 1 TABLET (40 MG TOTAL)  BY MOUTH DAILY AT 6 PM. 90 tablet 0  . cloNIDine (CATAPRES) 0.3 MG tablet Take 1 tablet (0.3 mg total) by mouth 3 (three) times daily. Please keep upcoming appt in June before anymore refills. Thank you 270 tablet 0  . doxazosin (CARDURA) 8 MG tablet Take 1 tablet (8 mg total) by mouth daily. 30 tablet 11  . folic acid (FOLVITE) 950 MCG tablet Take 400 mcg by mouth daily.    . furosemide (LASIX) 20 MG tablet TAKE 1 TABLET BY MOUTH ONCE DAILY AS NEEDED 90 tablet 1  . hydrALAZINE (APRESOLINE) 100 MG tablet TAKE 1 TABLET BY MOUTH THREE TIMES DAILY 90 tablet 1  . ibuprofen (ADVIL) 800 MG tablet Take 1 tablet (800 mg total) by mouth every 8 (eight) hours as needed. 30 tablet 3  . labetalol (NORMODYNE) 200 MG tablet Take 3 tablets (600 mg total) by mouth 3 (three) times daily. 270 tablet 1  . Multiple Vitamin (MULTIVITAMIN WITH MINERALS) TABS tablet Take 1 tablet by mouth daily as needed.    . ondansetron (ZOFRAN ODT) 4 MG disintegrating tablet Take 1 tablet (4 mg total) by mouth every 8 (eight) hours as needed for nausea or vomiting. 30 tablet 2  . Potassium Gluconate 595 MG CAPS Take 1 capsule by mouth daily.    . PSYLLIUM PO Take 1 tablet by mouth daily.    . traZODone (DESYREL) 100 MG tablet TAKE 1 TABLET BY MOUTH AT BEDTIME AS NEEDED FOR SLEEP 90 tablet 6   No facility-administered medications prior to visit.    Allergies  Allergen Reactions  . Lactose Intolerance (Gi) Nausea And Vomiting  . Other Swelling    All beans  . Isosorbide Itching    headaches  . Nitroglycerin Anxiety and Other (See Comments)    Pain and feels like he is on fire    ROS Review of Systems  Constitutional: Negative.   HENT: Negative.   Eyes: Negative.   Respiratory: Negative.   Cardiovascular: Negative.   Gastrointestinal: Negative.   Endocrine: Negative.   Genitourinary: Negative.   Musculoskeletal: Negative.   Skin: Negative.   Allergic/Immunologic: Negative.   Neurological: Positive for dizziness  (occasional ) and headaches (occasional ).  Hematological: Negative.   Psychiatric/Behavioral: Negative.       Objective:    Physical Exam  Constitutional: He is oriented to person, place, and time. He appears well-developed and well-nourished.  HENT:  Head: Normocephalic and atraumatic.  Eyes: Conjunctivae are normal.  Cardiovascular: Normal rate, regular rhythm, normal heart sounds and intact distal pulses.  Pulmonary/Chest: Effort normal and breath sounds normal.  Abdominal: Soft. Bowel sounds are normal.  Musculoskeletal:  General: Normal range of motion.     Cervical back: Normal range of motion and neck supple.  Neurological: He is alert and oriented to person, place, and time. He has normal reflexes.  Skin: Skin is warm and dry.  Psychiatric: He has a normal mood and affect. His behavior is normal. Judgment and thought content normal.  Nursing note and vitals reviewed.   BP (!) 242/110 (BP Location: Left Arm)   Pulse 63   Temp 98.6 F (37 C)   Ht 5\' 11"  (1.803 m)   Wt 201 lb 9.6 oz (91.4 kg)   SpO2 97%   BMI 28.12 kg/m  Wt Readings from Last 3 Encounters:  02/17/20 201 lb 9.6 oz (91.4 kg)  12/11/19 197 lb 3.2 oz (89.4 kg)  11/18/19 196 lb 3.2 oz (89 kg)     Health Maintenance Due  Topic Date Due  . COVID-19 Vaccine (1) Never done    There are no preventive care reminders to display for this patient.  Lab Results  Component Value Date   TSH 1.310 03/01/2018   Lab Results  Component Value Date   WBC 4.2 08/27/2019   HGB 8.9 (L) 08/27/2019   HCT 27.1 (L) 08/27/2019   MCV 91.9 08/27/2019   PLT 220 08/27/2019   Lab Results  Component Value Date   NA 137 08/27/2019   K 3.7 08/27/2019   CO2 23 08/27/2019   GLUCOSE 94 08/27/2019   BUN 61 (H) 08/27/2019   CREATININE 7.08 (H) 08/27/2019   BILITOT <0.2 04/24/2019   ALKPHOS 90 04/24/2019   AST 13 04/24/2019   ALT 8 04/24/2019   PROT 6.7 04/24/2019   ALBUMIN 3.6 08/27/2019   CALCIUM 8.8 (L)  08/27/2019   CALCIUM 8.6 (L) 08/27/2019   ANIONGAP 12 08/27/2019   Lab Results  Component Value Date   CHOL 96 (L) 03/01/2018   Lab Results  Component Value Date   HDL 40 03/01/2018   Lab Results  Component Value Date   LDLCALC 44 03/01/2018   Lab Results  Component Value Date   TRIG 129 08/25/2019   Lab Results  Component Value Date   CHOLHDL 2.4 03/01/2018   Lab Results  Component Value Date   HGBA1C 4.9 03/01/2018      Assessment & Plan:   1. Accelerated hypertension Blood pressures are elevated today. Clonidine 0.3 mg given to patient in office and blood pressures remain elevated. We referred him to ED via ambulance at this time. Patient refused and signed AMA form at discharge. He denies severe headaches, confusion, seizures, double vision, and blurred vision, nausea and vomiting. He will report to ED if he experiences these symptoms. Patient verbalized understanding.    2. Hypertension, unspecified type  3. Hydrocele, bilateral He is in the process of scheduling for surgery.   4. Dizziness  5. Abnormal urinalysis Results ar pending.  - Urine Culture  6. Healthcare maintenance - POCT urinalysis dipstick - POCT glucose (manual entry)  7. Follow up He will follow up in 2 weeks.   No orders of the defined types were placed in this encounter.  Orders Placed This Encounter  Procedures  . Urine Culture  . POCT urinalysis dipstick  . POCT glucose (manual entry)   Referral Orders  No referral(s) requested today    Kathe Becton,  MSN, FNP-BC [facility] [address] [place], [place] [address] [phone] fax   Problem List  Items Addressed This Visit      Cardiovascular and Mediastinum   Hypertension    Other Visit Diagnoses    Accelerated hypertension    -  Primary   Hydrocele, bilateral       Dizziness       Abnormal urinalysis       Relevant Orders    Urine Culture   Healthcare maintenance       Relevant Orders   POCT urinalysis dipstick (Completed)   POCT glucose (manual entry) (Completed)   Follow up          No orders of the defined types were placed in this encounter.   Follow-up: No follow-ups on file.    [name], FNP

## 2020-02-20 LAB — URINE CULTURE: Organism ID, Bacteria: NO GROWTH

## 2020-02-21 ENCOUNTER — Ambulatory Visit: Payer: Medicaid Other | Admitting: Family Medicine

## 2020-02-24 ENCOUNTER — Telehealth: Payer: Self-pay

## 2020-02-24 ENCOUNTER — Other Ambulatory Visit: Payer: Self-pay

## 2020-02-24 ENCOUNTER — Telehealth: Payer: Self-pay | Admitting: Family Medicine

## 2020-02-24 MED ORDER — LABETALOL HCL 200 MG PO TABS: 600.0000 mg | ORAL_TABLET | Freq: Three times a day (TID) | ORAL | 0 refills | Status: DC

## 2020-02-24 NOTE — Telephone Encounter (Signed)
Stanley [name] has been informed about follow with Dr. Marlou Porch

## 2020-02-24 NOTE — Telephone Encounter (Signed)
Pt's mother called needing prescription for labetalol sent to [facility] on [address] this one time because the typical pharmacy [facility] is out of the med.

## 2020-02-24 NOTE — Telephone Encounter (Signed)
Pt's medication was sent to pt's pharmacy as requested. Confirmation received.  °

## 2020-02-24 NOTE — Telephone Encounter (Signed)
Stanley [name] called: Pt's mother called needing prescription for labetalol sent to [facility] [address] this one time because the typical pharmacy ([facility] is out of the med.  Do you want to refill? There is a note attached to the script from Dr. Marlou Porch. Please review and advise.

## 2020-02-27 ENCOUNTER — Telehealth: Payer: Self-pay | Admitting: Family Medicine

## 2020-02-27 NOTE — Telephone Encounter (Signed)
Pt wants referral to different kidney doctor

## 2020-03-03 ENCOUNTER — Other Ambulatory Visit: Payer: Self-pay | Admitting: Cardiology

## 2020-03-04 ENCOUNTER — Telehealth: Payer: Self-pay

## 2020-03-04 ENCOUNTER — Ambulatory Visit: Payer: Medicaid Other | Admitting: Cardiology

## 2020-03-04 NOTE — Progress Notes (Deleted)
Cardiology Office Note:    Date:  03/04/2020   ID:  Stanley [name], DOB [birth year], MRN [id number]  PCP:  [name], FNP  Cardiologist:  Candee Furbish, MD  Electrophysiologist:  None   Referring MD: [name], FNP     History of Present Illness:    Stanley [name] is a 44 y.o. male here for follow up of difficult to control hypertension.  His blood pressure is also being monitored and controlled by Dr. Dewaine Oats nephrology. Of note, patient is on two beta blockers. Due to the limited amount of medication options for patient due to CKD (cannot use ACE/ARB or aldosterone antagonists) and the lack of evidence that there is any bradycardia, it has been decided to leave as is.  Has no showed to HTN clinic follow up.   Admission 08/2019 for HTN    Past Medical History:  Diagnosis Date  . Anxiety   . CKD (chronic kidney disease), stage IV ([place])   . Depression   . Dyslexia   . Heart palpitations   . High cholesterol   . Hypertension   . Hypertensive urgency   . Polysubstance (including opioids) dependence, binge pattern ([place]) 08/2020  . Stroke ([place]) 06/03/2017   "minor; completely recovered today" (06/13/2017)  . Tobacco abuse     Past Surgical History:  Procedure Laterality Date  . NO PAST SURGERIES      Current Medications: No outpatient medications have been marked as taking for the 03/04/20 encounter (Appointment) with Jerline Pain, MD.     Allergies:   Lactose intolerance (gi), Other, Isosorbide, and Nitroglycerin   Social History   Socioeconomic History  . Marital status: Married    Spouse name: Not on file  . Number of children: Not on file  . Years of education: Not on file  . Highest education level: Not on file  Occupational History  . Not on file  Tobacco Use  . Smoking status: Current Every Day Smoker    Packs/day: 1.00    Years: 30.00    Pack years: 30.00    Types: Cigarettes  . Smokeless tobacco: Never Used  Substance and Sexual  Activity  . Alcohol use: No  . Drug use: Yes    Types: Marijuana    Comment: 06/13/2017 "not often"  . Sexual activity: Not Currently  Other Topics Concern  . Not on file  Social History Narrative  . Not on file   Social Determinants of Health   Financial Resource Strain:   . Difficulty of Paying Living Expenses:   Food Insecurity:   . Worried About Charity fundraiser in the Last Year:   . Arboriculturist in the Last Year:   Transportation Needs:   . Film/video editor (Medical):   Marland Kitchen Lack of Transportation (Non-Medical):   Physical Activity:   . Days of Exercise per Week:   . Minutes of Exercise per Session:   Stress:   . Feeling of Stress :   Social Connections:   . Frequency of Communication with Friends and Family:   . Frequency of Social Gatherings with Friends and Family:   . Attends Religious Services:   . Active Member of Clubs or Organizations:   . Attends Archivist Meetings:   Marland Kitchen Marital Status:      Family History: The patient's ***family history includes Diabetes type II in his brother; Hypertension in his brother and mother.  ROS:   Please see the  history of present illness.    *** All other systems reviewed and are negative.  EKGs/Labs/Other Studies Reviewed:    The following studies were reviewed today: ***  EKG:  EKG is *** ordered today.  The ekg ordered today demonstrates ***  Recent Labs: 04/24/2019: ALT 8 08/23/2019: B Natriuretic Peptide 776.9; Magnesium 2.4 08/27/2019: BUN 61; Creatinine, Ser 7.08; Hemoglobin 8.9; Platelets 220; Potassium 3.7; Sodium 137  Recent Lipid Panel    Component Value Date/Time   CHOL 96 (L) 03/01/2018 1356   TRIG 129 08/25/2019 0920   HDL 40 03/01/2018 1356   CHOLHDL 2.4 03/01/2018 1356   CHOLHDL 4.3 09/27/2016 0301   VLDL 14 09/27/2016 0301   LDLCALC 44 03/01/2018 1356    Physical Exam:    VS:  There were no vitals taken for this visit.    Wt Readings from Last 3 Encounters:  02/17/20  201 lb 9.6 oz (91.4 kg)  12/11/19 197 lb 3.2 oz (89.4 kg)  11/18/19 196 lb 3.2 oz (89 kg)     GEN: *** Well nourished, well developed in no acute distress HEENT: Normal NECK: No JVD; No carotid bruits LYMPHATICS: No lymphadenopathy CARDIAC: ***RRR, no murmurs, rubs, gallops RESPIRATORY:  Clear to auscultation without rales, wheezing or rhonchi  ABDOMEN: Soft, non-tender, non-distended MUSCULOSKELETAL:  No edema; No deformity  SKIN: Warm and dry NEUROLOGIC:  Alert and oriented x 3 PSYCHIATRIC:  Normal affect   ASSESSMENT:    No diagnosis found. PLAN:    In order of problems listed above:  1. Hypertension - per prior HTN clinic note: BP remains above goal <130/47mmHg, but significant improvement from past visits.  Pt did not bring in home BP readings, however, at today's visit BP was 142/89 mmHg. Pt is currently taking amlodipine 10mg  daily, clonidine 0.3mg  TID, furosemide 20mg  prn - taking daily in AM, hydralazine 100mg  TID, labetalol 400mg  TID, Toprol 50mg  daily, doxazosin 4mg  at bedtime. Recommend increasing doxazosin from 4 mg daily to 8 mg daily. Will f/u with pt pm 05/21/19 to determine effects of dosage increase on BP. If possible in future visits would like to get patient off of two beta blockers as this is most likely contributing to his fatigue and in ability to exercise.  2.Tobacco abuse - Pt has made improvements with smoking (1 pack/2 days to 1 pack/week). Encouraged pt for his success. Pt hasnt been using nicotine patch daily. Encouraged patient to use nicotine patch daily and not buy any more cigarettes. He has been unable to obtain nicotine gum.  Medication Adjustments/Labs and Tests Ordered: Current medicines are reviewed at length with the patient today.  Concerns regarding medicines are outlined above.  No orders of the defined types were placed in this encounter.  No orders of the defined types were placed in this encounter.   There are no Patient Instructions on  file for this visit.   Signed, Candee Furbish, MD  03/04/2020 6:49 AM    [facility]

## 2020-03-04 NOTE — Telephone Encounter (Signed)
I spoke with Mrs. Jiggetts (Mother): If an new "Kidney Doctor" is requested, the location will be in [place] or [place]. However he may request to see another provider at his current location.   She will let Ladislao know.

## 2020-03-10 ENCOUNTER — Encounter: Payer: Self-pay | Admitting: Family Medicine

## 2020-03-10 ENCOUNTER — Other Ambulatory Visit: Payer: Self-pay

## 2020-03-10 ENCOUNTER — Telehealth: Payer: Self-pay | Admitting: Family Medicine

## 2020-03-10 ENCOUNTER — Ambulatory Visit (INDEPENDENT_AMBULATORY_CARE_PROVIDER_SITE_OTHER): Payer: Medicaid Other | Admitting: Family Medicine

## 2020-03-10 VITALS — BP 215/108 | HR 66 | Temp 98.6°F | Ht 71.0 in | Wt 196.8 lb

## 2020-03-10 DIAGNOSIS — I10 Essential (primary) hypertension: Secondary | ICD-10-CM | POA: Diagnosis not present

## 2020-03-10 DIAGNOSIS — I509 Heart failure, unspecified: Secondary | ICD-10-CM | POA: Diagnosis not present

## 2020-03-10 DIAGNOSIS — Z09 Encounter for follow-up examination after completed treatment for conditions other than malignant neoplasm: Secondary | ICD-10-CM

## 2020-03-10 DIAGNOSIS — N433 Hydrocele, unspecified: Secondary | ICD-10-CM

## 2020-03-10 DIAGNOSIS — N50812 Left testicular pain: Secondary | ICD-10-CM

## 2020-03-10 DIAGNOSIS — R110 Nausea: Secondary | ICD-10-CM

## 2020-03-10 DIAGNOSIS — F419 Anxiety disorder, unspecified: Secondary | ICD-10-CM

## 2020-03-10 DIAGNOSIS — N50811 Right testicular pain: Secondary | ICD-10-CM

## 2020-03-10 DIAGNOSIS — G4700 Insomnia, unspecified: Secondary | ICD-10-CM

## 2020-03-10 LAB — POCT URINALYSIS DIPSTICK
Bilirubin, UA: NEGATIVE
Blood, UA: NEGATIVE
Glucose, UA: NEGATIVE
Nitrite, UA: NEGATIVE
Protein, UA: POSITIVE — AB
Spec Grav, UA: 1.025 (ref 1.010–1.025)
Urobilinogen, UA: 0.2 E.U./dL
pH, UA: 6 (ref 5.0–8.0)

## 2020-03-10 MED ORDER — ALBUTEROL SULFATE HFA 108 (90 BASE) MCG/ACT IN AERS: 2.0000 | INHALATION_SPRAY | RESPIRATORY_TRACT | 12 refills | Status: DC | PRN

## 2020-03-10 MED ORDER — ATORVASTATIN CALCIUM 40 MG PO TABS: 40.0000 mg | ORAL_TABLET | Freq: Every day | ORAL | 11 refills | Status: DC

## 2020-03-10 MED ORDER — CLONIDINE HCL 0.1 MG PO TABS
0.1000 mg | ORAL_TABLET | Freq: Once | ORAL | Status: AC
  Administered 2020-03-10: 0.1 mg via ORAL

## 2020-03-10 MED ORDER — ONDANSETRON 4 MG PO TBDP: 4.0000 mg | ORAL_TABLET | Freq: Three times a day (TID) | ORAL | 3 refills | Status: DC | PRN

## 2020-03-10 MED ORDER — CLONIDINE HCL 0.3 MG PO TABS: 0.3000 mg | ORAL_TABLET | Freq: Three times a day (TID) | ORAL | 6 refills | Status: DC

## 2020-03-10 MED ORDER — TRAZODONE HCL 100 MG PO TABS: 100.0000 mg | ORAL_TABLET | Freq: Every evening | ORAL | 11 refills | Status: DC | PRN

## 2020-03-10 MED ORDER — FUROSEMIDE 20 MG PO TABS: 20.0000 mg | ORAL_TABLET | Freq: Every day | ORAL | 11 refills | Status: DC | PRN

## 2020-03-10 MED ORDER — IBUPROFEN 800 MG PO TABS: 800.0000 mg | ORAL_TABLET | Freq: Three times a day (TID) | ORAL | 3 refills | Status: DC | PRN

## 2020-03-10 MED ORDER — DOXAZOSIN MESYLATE 8 MG PO TABS: 8.0000 mg | ORAL_TABLET | Freq: Every day | ORAL | 11 refills | Status: DC

## 2020-03-10 MED ORDER — FOLIC ACID 800 MCG PO TABS: 400.0000 ug | ORAL_TABLET | Freq: Every day | ORAL | 11 refills | Status: AC

## 2020-03-10 MED ORDER — HYDRALAZINE HCL 100 MG PO TABS: 100.0000 mg | ORAL_TABLET | Freq: Three times a day (TID) | ORAL | 11 refills | Status: DC

## 2020-03-10 NOTE — Progress Notes (Signed)
Patient [facility]   Established Patient Office Visit  Subjective:  Patient ID: Stanley [name], male    DOB: [birth year]  Age: 44 y.o. MRN: [id number]  CC:  Chief Complaint  Patient presents with  . Follow-up    follow up  kidney , need refill on all medication     HPI Stanley [name] is a 44 year old male who presents for Follow Up today.   Past Medical History:  Diagnosis Date  . Anxiety   . CKD (chronic kidney disease), stage IV ([place])   . Depression   . Dyslexia   . Heart palpitations   . High cholesterol   . Hypertension   . Hypertensive urgency   . Polysubstance (including opioids) dependence, binge pattern ([place]) 08/2020  . Stroke ([place]) 06/03/2017   "minor; completely recovered today" (06/13/2017)  . Tobacco abuse    Patient Active Problem List   Diagnosis Date Noted  . Observed sleep apnea 12/11/2019  . Pain in both testicles 11/19/2019  . Hypertensive crisis 08/23/2019  . Acute posterior epistaxis 08/23/2019  . Heart palpitations 02/23/2019  . Anxiety 12/19/2018  . Acute pain of right knee 12/19/2018  . Inflammation of joint of right knee 12/19/2018  . Swollen testicle 12/19/2018  . Syncope and collapse   . Transient vision disturbance of left eye   . Hydrocele   . Hypertensive urgency 06/12/2017  . CKD stage 4 secondary to hypertension ([place]) 03/01/2017  . Anemia, chronic disease 03/01/2017  . Hyperparathyroidism, secondary renal ([place]) 03/01/2017  . Hypertensive emergency 09/26/2016  . Tobacco abuse 09/26/2016  . Hypertension 09/26/2016  . AKI (acute kidney injury) ([place]) 09/26/2016    Current Status: Since his last office visit, states that he has not been taking any of his medication, because they were accidentally thrown away. Today, he has elevated blood pressures. He denies visual changes, chest pain, cough, shortness of breath, heart palpitations, and falls. He has occasional headaches and dizziness with position  changes. Denies severe headaches, confusion, seizures, double vision, and blurred vision, nausea and vomiting. He denies fevers, chills, fatigue, recent infections, weight loss, and night sweats. Denies GI problems such as diarrhea, and constipation. He has no reports of blood in stools, dysuria and hematuria. He reports moderate anxiety today r/t family stressors. He denies suicidal ideations, homicidal ideations, or auditory hallucinations. He is taking all medications as prescribed. He denies pain today.   Past Surgical History:  Procedure Laterality Date  . NO PAST SURGERIES      Family History  Problem Relation Age of Onset  . Hypertension Mother   . Hypertension Brother   . Diabetes type II Brother     Social History   Socioeconomic History  . Marital status: Married    Spouse name: Not on file  . Number of children: Not on file  . Years of education: Not on file  . Highest education level: Not on file  Occupational History  . Not on file  Tobacco Use  . Smoking status: Current Every Day Smoker    Packs/day: 1.00    Years: 30.00    Pack years: 30.00    Types: Cigarettes  . Smokeless tobacco: Never Used  Substance and Sexual Activity  . Alcohol use: No  . Drug use: Yes    Types: Marijuana    Comment: 06/13/2017 "not often"  . Sexual activity: Not Currently  Other Topics Concern  . Not on file  Social History Narrative  . Not on file   Social Determinants of Health   Financial Resource Strain:   . Difficulty of Paying Living Expenses:   Food Insecurity:   . Worried About Charity fundraiser in the Last Year:   . Arboriculturist in the Last Year:   Transportation Needs:   . Film/video editor (Medical):   Marland Kitchen Lack of Transportation (Non-Medical):   Physical Activity:   . Days of Exercise per Week:   . Minutes of Exercise per Session:   Stress:   . Feeling of Stress :   Social Connections:   . Frequency of Communication with Friends and Family:   .  Frequency of Social Gatherings with Friends and Family:   . Attends Religious Services:   . Active Member of Clubs or Organizations:   . Attends Archivist Meetings:   Marland Kitchen Marital Status:   Intimate Partner Violence:   . Fear of Current or Ex-Partner:   . Emotionally Abused:   Marland Kitchen Physically Abused:   . Sexually Abused:     Outpatient Medications Prior to Visit  Medication Sig Dispense Refill  . acetaminophen (TYLENOL) 500 MG tablet Take 1,000 mg by mouth as needed for mild pain.    Marland Kitchen aspirin EC 81 MG tablet Take 81 mg by mouth daily.    . Multiple Vitamin (MULTIVITAMIN WITH MINERALS) TABS tablet Take 1 tablet by mouth daily as needed.    . Potassium Gluconate 595 MG CAPS Take 1 capsule by mouth daily.    . PSYLLIUM PO Take 1 tablet by mouth daily.    Marland Kitchen albuterol (VENTOLIN HFA) 108 (90 Base) MCG/ACT inhaler Inhale 2 puffs into the lungs every 4 (four) hours as needed for wheezing or shortness of breath (cough, shortness of breath or wheezing.). 8 g 12  . atorvastatin (LIPITOR) 40 MG tablet Take 1 tablet (40 mg total) by mouth daily. Please keep upcoming appt in June with Dr. Marlou Porch before anymore refills. Thank you 90 tablet 0  . cloNIDine (CATAPRES) 0.3 MG tablet Take 1 tablet (0.3 mg total) by mouth 3 (three) times daily. Please keep upcoming appt in June before anymore refills. Thank you 270 tablet 0  . doxazosin (CARDURA) 8 MG tablet Take 1 tablet (8 mg total) by mouth daily. 30 tablet 11  . folic acid (FOLVITE) 888 MCG tablet Take 400 mcg by mouth daily.    . furosemide (LASIX) 20 MG tablet TAKE 1 TABLET BY MOUTH ONCE DAILY AS NEEDED 90 tablet 1  . hydrALAZINE (APRESOLINE) 100 MG tablet TAKE 1 TABLET BY MOUTH THREE TIMES DAILY 90 tablet 1  . ibuprofen (ADVIL) 800 MG tablet Take 1 tablet (800 mg total) by mouth every 8 (eight) hours as needed. 30 tablet 3  . labetalol (NORMODYNE) 200 MG tablet Take 3 tablets (600 mg total) by mouth 3 (three) times daily. Please keep upcoming appt  in June with Dr. Marlou Porch for future refills. Thank yu 270 tablet 0  . ondansetron (ZOFRAN ODT) 4 MG disintegrating tablet Take 1 tablet (4 mg total) by mouth every 8 (eight) hours as needed for nausea or vomiting. 30 tablet 2  . traZODone (DESYREL) 100 MG tablet TAKE 1 TABLET BY MOUTH AT BEDTIME AS NEEDED FOR SLEEP 90 tablet 6   No facility-administered medications prior to visit.    Allergies  Allergen Reactions  . Lactose Intolerance (Gi) Nausea And Vomiting  . Other Swelling    All beans  .  Isosorbide Itching    headaches  . Nitroglycerin Anxiety and Other (See Comments)    Pain and feels like he is on fire    ROS Review of Systems  Constitutional: Negative.   HENT: Negative.   Eyes: Negative.   Respiratory: Negative.   Cardiovascular: Negative.   Gastrointestinal: Negative.   Endocrine: Negative.   Genitourinary: Negative.   Musculoskeletal: Negative.   Skin: Negative.   Allergic/Immunologic: Negative.   Neurological: Positive for dizziness (occasional ) and headaches (occasional ).  Hematological: Negative.   Psychiatric/Behavioral: Negative.       Objective:    Physical Exam  Constitutional: He is oriented to person, place, and time. He appears well-developed and well-nourished.  HENT:  Head: Normocephalic and atraumatic.  Eyes: Conjunctivae are normal.  Cardiovascular: Normal rate, regular rhythm, normal heart sounds and intact distal pulses.  Pulmonary/Chest: Effort normal and breath sounds normal.  Abdominal: Soft. Bowel sounds are normal.  Musculoskeletal:        General: Normal range of motion.     Cervical back: Normal range of motion and neck supple.  Neurological: He is alert and oriented to person, place, and time. He has normal reflexes.  Skin: Skin is warm and dry.  Psychiatric: He has a normal mood and affect. His behavior is normal. Judgment and thought content normal.  Nursing note and vitals reviewed.   BP (!) 215/108   Pulse 66   Temp  98.6 F (37 C) (Temporal)   Ht 5\' 11"  (1.803 m)   Wt 196 lb 12.8 oz (89.3 kg)   BMI 27.45 kg/m  Wt Readings from Last 3 Encounters:  03/10/20 196 lb 12.8 oz (89.3 kg)  02/17/20 201 lb 9.6 oz (91.4 kg)  12/11/19 197 lb 3.2 oz (89.4 kg)     Health Maintenance Due  Topic Date Due  . Hepatitis C Screening  Never done  . COVID-19 Vaccine (1) Never done  . TETANUS/TDAP  Never done    There are no preventive care reminders to display for this patient.  Lab Results  Component Value Date   TSH 1.310 03/01/2018   Lab Results  Component Value Date   WBC 4.2 08/27/2019   HGB 8.9 (L) 08/27/2019   HCT 27.1 (L) 08/27/2019   MCV 91.9 08/27/2019   PLT 220 08/27/2019   Lab Results  Component Value Date   NA 137 08/27/2019   K 3.7 08/27/2019   CO2 23 08/27/2019   GLUCOSE 94 08/27/2019   BUN 61 (H) 08/27/2019   CREATININE 7.08 (H) 08/27/2019   BILITOT <0.2 04/24/2019   ALKPHOS 90 04/24/2019   AST 13 04/24/2019   ALT 8 04/24/2019   PROT 6.7 04/24/2019   ALBUMIN 3.6 08/27/2019   CALCIUM 8.8 (L) 08/27/2019   CALCIUM 8.6 (L) 08/27/2019   ANIONGAP 12 08/27/2019   Lab Results  Component Value Date   CHOL 96 (L) 03/01/2018   Lab Results  Component Value Date   HDL 40 03/01/2018   Lab Results  Component Value Date   LDLCALC 44 03/01/2018   Lab Results  Component Value Date   TRIG 129 08/25/2019   Lab Results  Component Value Date   CHOLHDL 2.4 03/01/2018   Lab Results  Component Value Date   HGBA1C 4.9 03/01/2018      Assessment & Plan:   1. Hypertension, accelerated Blood pressures are elevated today. Clonidine 0.3 mg given to patient in office and blood pressures remain elevated. We referred him to ED  via ambulance at this time. Patient refused and signed AMA form at discharge. He denies severe headaches, confusion, seizures, double vision, and blurred vision, nausea and vomiting. He will report to ED if he experiences these symptoms. Patient verbalized  understanding. All refills of all medications sent to pharmacy today. He will continue to take medications as prescribed, to decrease high sodium intake, excessive alcohol intake, increase potassium intake, smoking cessation, and increase physical activity of at least 30 minutes of cardio activity daily. He will continue to follow Heart Healthy or DASH diet. - cloNIDine (CATAPRES) 0.3 MG tablet; Take 1 tablet (0.3 mg total) by mouth 3 (three) times daily.  Dispense: 90 tablet; Refill: 6  2. Uncontrolled hypertension - furosemide (LASIX) 20 MG tablet; Take 1 tablet (20 mg total) by mouth daily as needed.  Dispense: 90 tablet; Refill: 11  3. Hypertension, unspecified type - Urinalysis Dipstick - cloNIDine (CATAPRES) tablet 0.1 mg - cloNIDine (CATAPRES) tablet 0.1 mg - doxazosin (CARDURA) 8 MG tablet; Take 1 tablet (8 mg total) by mouth daily.  Dispense: 30 tablet; Refill: 11  4. Chronic congestive heart failure, unspecified heart failure type ([place]) Stable. No signs or symptoms of respiratory distress noted or reported today.  - albuterol (VENTOLIN HFA) 108 (90 Base) MCG/ACT inhaler; Inhale 2 puffs into the lungs every 4 (four) hours as needed for wheezing or shortness of breath (cough, shortness of breath or wheezing.).  Dispense: 8 g; Refill: 12 - furosemide (LASIX) 20 MG tablet; Take 1 tablet (20 mg total) by mouth daily as needed.  Dispense: 90 tablet; Refill: 11  5. Hydrocele, bilateral - ibuprofen (ADVIL) 800 MG tablet; Take 1 tablet (800 mg total) by mouth every 8 (eight) hours as needed.  Dispense: 30 tablet; Refill: 3  6. Pain in both testicles - ibuprofen (ADVIL) 800 MG tablet; Take 1 tablet (800 mg total) by mouth every 8 (eight) hours as needed.  Dispense: 30 tablet; Refill: 3  7. Nausea - ondansetron (ZOFRAN ODT) 4 MG disintegrating tablet; Take 1 tablet (4 mg total) by mouth every 8 (eight) hours as needed for nausea or vomiting.  Dispense: 30 tablet; Refill: 3  8. Insomnia,  unspecified type - traZODone (DESYREL) 100 MG tablet; Take 1 tablet (100 mg total) by mouth at bedtime as needed. for sleep  Dispense: 90 tablet; Refill: 11  9. Anxiety Stable today.   10. Follow up He will follow up in 1 month.   Meds ordered this encounter  Medications  . cloNIDine (CATAPRES) tablet 0.1 mg  . cloNIDine (CATAPRES) tablet 0.1 mg  . albuterol (VENTOLIN HFA) 108 (90 Base) MCG/ACT inhaler    Sig: Inhale 2 puffs into the lungs every 4 (four) hours as needed for wheezing or shortness of breath (cough, shortness of breath or wheezing.).    Dispense:  8 g    Refill:  12  . atorvastatin (LIPITOR) 40 MG tablet    Sig: Take 1 tablet (40 mg total) by mouth daily. Please keep upcoming appt in June with Dr. Marlou Porch before anymore refills. Thank you    Dispense:  30 tablet    Refill:  11  . cloNIDine (CATAPRES) 0.3 MG tablet    Sig: Take 1 tablet (0.3 mg total) by mouth 3 (three) times daily.    Dispense:  90 tablet    Refill:  6  . doxazosin (CARDURA) 8 MG tablet    Sig: Take 1 tablet (8 mg total) by mouth daily.    Dispense:  30  tablet    Refill:  11  . folic acid (FOLVITE) 010 MCG tablet    Sig: Take 0.5 tablets (400 mcg total) by mouth daily.    Dispense:  30 tablet    Refill:  11  . furosemide (LASIX) 20 MG tablet    Sig: Take 1 tablet (20 mg total) by mouth daily as needed.    Dispense:  90 tablet    Refill:  11  . hydrALAZINE (APRESOLINE) 100 MG tablet    Sig: Take 1 tablet (100 mg total) by mouth 3 (three) times daily.    Dispense:  90 tablet    Refill:  11  . ibuprofen (ADVIL) 800 MG tablet    Sig: Take 1 tablet (800 mg total) by mouth every 8 (eight) hours as needed.    Dispense:  30 tablet    Refill:  3  . ondansetron (ZOFRAN ODT) 4 MG disintegrating tablet    Sig: Take 1 tablet (4 mg total) by mouth every 8 (eight) hours as needed for nausea or vomiting.    Dispense:  30 tablet    Refill:  3  . traZODone (DESYREL) 100 MG tablet    Sig: Take 1 tablet  (100 mg total) by mouth at bedtime as needed. for sleep    Dispense:  90 tablet    Refill:  11    Orders Placed This Encounter  Procedures  . Urinalysis Dipstick    Referral Orders  No referral(s) requested today    Kathe Becton,  MSN, FNP-BC [address] [place], [place] [address] [phone]   Problem List Items Addressed This Visit      Cardiovascular and Mediastinum   Hypertension   Relevant Medications   atorvastatin (LIPITOR) 40 MG tablet   cloNIDine (CATAPRES) 0.3 MG tablet   doxazosin (CARDURA) 8 MG tablet   furosemide (LASIX) 20 MG tablet   hydrALAZINE (APRESOLINE) 100 MG tablet   Other Relevant Orders   Urinalysis Dipstick (Completed)     Other   Anxiety   Relevant Medications   traZODone (DESYREL) 100 MG tablet   Pain in both testicles   Relevant Medications   ibuprofen (ADVIL) 800 MG tablet    Other Visit Diagnoses    Hypertension, accelerated    -  Primary   Relevant Medications   cloNIDine (CATAPRES) tablet 0.1 mg (Completed)   cloNIDine (CATAPRES) tablet 0.1 mg (Completed)   atorvastatin (LIPITOR) 40 MG tablet   cloNIDine (CATAPRES) 0.3 MG tablet   doxazosin (CARDURA) 8 MG tablet   furosemide (LASIX) 20 MG tablet   hydrALAZINE (APRESOLINE) 100 MG tablet   Uncontrolled hypertension       Relevant Medications   cloNIDine (CATAPRES) tablet 0.1 mg (Completed)   cloNIDine (CATAPRES) tablet 0.1 mg (Completed)   atorvastatin (LIPITOR) 40 MG tablet   cloNIDine (CATAPRES) 0.3 MG tablet   doxazosin (CARDURA) 8 MG tablet   furosemide (LASIX) 20 MG tablet   hydrALAZINE (APRESOLINE) 100 MG tablet   Chronic congestive heart failure, unspecified heart failure type ([facility])       Relevant Medications   cloNIDine (CATAPRES) tablet 0.1 mg (Completed)   cloNIDine (CATAPRES) tablet 0.1 mg (Completed)   albuterol (VENTOLIN HFA) 108 (90 Base) MCG/ACT inhaler     atorvastatin (LIPITOR) 40 MG tablet   cloNIDine (CATAPRES) 0.3 MG tablet   doxazosin (CARDURA) 8 MG tablet   furosemide (LASIX) 20 MG tablet  hydrALAZINE (APRESOLINE) 100 MG tablet   Hydrocele, bilateral       Relevant Medications   ibuprofen (ADVIL) 800 MG tablet   Nausea       Relevant Medications   ondansetron (ZOFRAN ODT) 4 MG disintegrating tablet   Insomnia, unspecified type       Relevant Medications   traZODone (DESYREL) 100 MG tablet   Follow up          Meds ordered this encounter  Medications  . cloNIDine (CATAPRES) tablet 0.1 mg  . cloNIDine (CATAPRES) tablet 0.1 mg  . albuterol (VENTOLIN HFA) 108 (90 Base) MCG/ACT inhaler    Sig: Inhale 2 puffs into the lungs every 4 (four) hours as needed for wheezing or shortness of breath (cough, shortness of breath or wheezing.).    Dispense:  8 g    Refill:  12  . atorvastatin (LIPITOR) 40 MG tablet    Sig: Take 1 tablet (40 mg total) by mouth daily. Please keep upcoming appt in June with Dr. Marlou Porch before anymore refills. Thank you    Dispense:  30 tablet    Refill:  11  . cloNIDine (CATAPRES) 0.3 MG tablet    Sig: Take 1 tablet (0.3 mg total) by mouth 3 (three) times daily.    Dispense:  90 tablet    Refill:  6  . doxazosin (CARDURA) 8 MG tablet    Sig: Take 1 tablet (8 mg total) by mouth daily.    Dispense:  30 tablet    Refill:  11  . folic acid (FOLVITE) 488 MCG tablet    Sig: Take 0.5 tablets (400 mcg total) by mouth daily.    Dispense:  30 tablet    Refill:  11  . furosemide (LASIX) 20 MG tablet    Sig: Take 1 tablet (20 mg total) by mouth daily as needed.    Dispense:  90 tablet    Refill:  11  . hydrALAZINE (APRESOLINE) 100 MG tablet    Sig: Take 1 tablet (100 mg total) by mouth 3 (three) times daily.    Dispense:  90 tablet    Refill:  11  . ibuprofen (ADVIL) 800 MG tablet    Sig: Take 1 tablet (800 mg total) by mouth every 8 (eight) hours as needed.    Dispense:  30 tablet    Refill:  3  .  ondansetron (ZOFRAN ODT) 4 MG disintegrating tablet    Sig: Take 1 tablet (4 mg total) by mouth every 8 (eight) hours as needed for nausea or vomiting.    Dispense:  30 tablet    Refill:  3  . traZODone (DESYREL) 100 MG tablet    Sig: Take 1 tablet (100 mg total) by mouth at bedtime as needed. for sleep    Dispense:  90 tablet    Refill:  11    Follow-up: Return in about 1 month (around 04/09/2020).    [name], FNP

## 2020-03-10 NOTE — Telephone Encounter (Signed)
Pt states their child threw out all their meds. Therefore they need labetalol,trazadone, furosemide clonidine, hydralazine, and doxazosin.

## 2020-03-10 NOTE — Telephone Encounter (Signed)
Pt has appt today , will discuss refill at appointment.

## 2020-03-11 ENCOUNTER — Telehealth: Payer: Self-pay | Admitting: Cardiology

## 2020-03-11 MED ORDER — LABETALOL HCL 200 MG PO TABS: 600.0000 mg | ORAL_TABLET | Freq: Three times a day (TID) | ORAL | 0 refills | Status: DC

## 2020-03-11 NOTE — Telephone Encounter (Signed)
New Message  Pt c/o medication issue:  1. Name of Medication:  labetalol (NORMODYNE) 200 MG tablet  2. How are you currently taking this medication (dosage and times per day)? As written  3. Are you having a reaction (difficulty breathing--STAT)?  No  4. What is your medication issue?  Pt only has medication left for today. Needs new prescription from Dr. Marlou Porch for refills. Pt no-showed for appt in June

## 2020-03-11 NOTE — Telephone Encounter (Signed)
I spoke with patient's mother.  I made her aware patient had missed appointments in our office. I scheduled patient for visit in hypertension clinic on June 14,2021 at 9:30.  I told her I would send 15 day supply of labetalol to [facility] at [facility] but that patient would need to keep appointment on June 14 for additional refills.

## 2020-03-15 NOTE — Progress Notes (Deleted)
Patient ID: BAYNE FOSNAUGH                 DOB: 1976-03-20                      MRN: [id number]     HPI: Stanley [name] is a 44 y.o. male referred by Dr. Marlou Porch to HTN clinic. PMH is significant for hypertensive emergency, LVH, chest pain, CKD stage IV, stroke, HLD, tobacco abuse, and anxiety/depression. BP was most recently elevated at 191/107 during telehealth visit on 6/22 with HTN clinic. Dr [name] with nephrology has previously been managing patient's BP. He has not seen the nephrologist in about 6 months. At last visit with HTN clinic, doxazosin was increased to 4mg  at bedtime. He was asked to weigh himself every morning. He was also asked to check him blood pressure once a day.  Of note, patient is on two beta blockers. Due to the limited amount of medication options for patient due to CKD (cannot use ACE/ARB or aldosterone antagonists) and the lack of evidence that there is any bradycardia, it has been decided to leave as is. Patient was seen in the ED last week for nausea and vomiting secondary to bactrim. He was bitten by a spider a few days prior to this and was placed on bactrim. His blood pressure in the ED was 145/81. HR 67.  We will be checking a CMP today to follow up on patients renal function.  Pt presents today in good spirits. When discussing his blood pressure, he states he feels dizzy and has blurred vision typically when he stands up too quickly. Educated pt that this is likely d/t to orthostatic hypotension and make sure to move slowly when changing from sitting to standing. Patient denies orthostatics being any worse after starting doxazosin.   Also, pt is concerned that it is challenging to afford his medications and will feel dizzy, SOB or have chest pain when he run out. He runs out of medications ~1 week/month. Pt states he is pursuing Medicaid application. We have provided him instructions on how to apply online and I have also reached out to Raquel Sarna, Education officer, [employer], to  see if there is anything she can do to assist.  Pt is still interested in pursuing smoking cessation. He has went from smoking 1 pack per 2 days to 1 pack per week. He is currently using the nicotine patch and does not smoke with the patch on; re-iterated to pt that smoking with the patch on can increase risk of nicotine toxicity therefore encouraged him for not doing so. Pt stated he is interested in using the nicotine gum. Reminded pt that nicotine gum rx was sent to the pharmacy, however, he is unable to remember why he was not able to obtain the prescription.   Of note he states that his O2 stats are usually in the 70's; confirmed that this was likely a machine error considering his stats at today's visit and his prior ED visit (04/18/19) were in the 90s.  Home BP readings: Pt did not bring BP log.   Current HTN meds: amlodipine 10mg  daily, clonidine 0.3mg  TID, furosemide 20mg  prn - taking daily in AM, hydralazine 100mg  TID, labetalol 400mg  TID, Toprol 50mg  daily, doxazosin 4mg  at bedtime  Previously tried: isosorbide (headaches)  BP goal: <130/7mmHg  Family History: The patient's family history includes Diabetes type II in his brother; Hypertension in his brother and mother.  Social History: Currently smokes  1 PPD, denies alcohol use, occasionally uses marijuana.  Diet: Breakfast - [place] sausage, eggs, oranges. Lunch - sandwich. Eats a lot of vegetables. Dinner - meat, starch, veggie. Only eats beef 2x a month, mostly eats chicken, veal, or [place]. Does not add any salt to food, mom buys foods with < 10% sodium. Caffeine is limited to 1 cup of coffee each day, occasionally sweet tea.  Exercise: Limited - medication makes him very tired/weak.  Wt Readings from Last 3 Encounters:  03/10/20 196 lb 12.8 oz (89.3 kg)  02/17/20 201 lb 9.6 oz (91.4 kg)  12/11/19 197 lb 3.2 oz (89.4 kg)   BP Readings from Last 3 Encounters:  03/10/20 (!) 215/108  02/17/20 (!) 242/110  11/18/19 (!) 147/86     Pulse Readings from Last 3 Encounters:  03/10/20 66  02/17/20 63  11/18/19 64    Renal function: CrCl cannot be calculated (Patient's most recent lab result is older than the maximum 21 days allowed.).  Past Medical History:  Diagnosis Date  . Anxiety   . CKD (chronic kidney disease), stage IV ([place])   . Depression   . Dyslexia   . Heart palpitations   . High cholesterol   . Hypertension   . Hypertensive urgency   . Polysubstance (including opioids) dependence, binge pattern ([place]) 08/2020  . Stroke ([place]) 06/03/2017   "minor; completely recovered today" (06/13/2017)  . Tobacco abuse     Current Outpatient Medications on File Prior to Visit  Medication Sig Dispense Refill  . acetaminophen (TYLENOL) 500 MG tablet Take 1,000 mg by mouth as needed for mild pain.    Marland Kitchen albuterol (VENTOLIN HFA) 108 (90 Base) MCG/ACT inhaler Inhale 2 puffs into the lungs every 4 (four) hours as needed for wheezing or shortness of breath (cough, shortness of breath or wheezing.). 8 g 12  . aspirin EC 81 MG tablet Take 81 mg by mouth daily.    Marland Kitchen atorvastatin (LIPITOR) 40 MG tablet Take 1 tablet (40 mg total) by mouth daily. Please keep upcoming appt in June with Dr. Marlou Porch before anymore refills. Thank you 30 tablet 11  . cloNIDine (CATAPRES) 0.3 MG tablet Take 1 tablet (0.3 mg total) by mouth 3 (three) times daily. 90 tablet 6  . doxazosin (CARDURA) 8 MG tablet Take 1 tablet (8 mg total) by mouth daily. 30 tablet 11  . folic acid (FOLVITE) 631 MCG tablet Take 0.5 tablets (400 mcg total) by mouth daily. 30 tablet 11  . furosemide (LASIX) 20 MG tablet Take 1 tablet (20 mg total) by mouth daily as needed. 90 tablet 11  . hydrALAZINE (APRESOLINE) 100 MG tablet Take 1 tablet (100 mg total) by mouth 3 (three) times daily. 90 tablet 11  . ibuprofen (ADVIL) 800 MG tablet Take 1 tablet (800 mg total) by mouth every 8 (eight) hours as needed. 30 tablet 3  . labetalol (NORMODYNE) 200 MG tablet Take 3 tablets (600  mg total) by mouth 3 (three) times daily. Please keep appointment on June 14,2021 for additional refills 135 tablet 0  . Multiple Vitamin (MULTIVITAMIN WITH MINERALS) TABS tablet Take 1 tablet by mouth daily as needed.    . ondansetron (ZOFRAN ODT) 4 MG disintegrating tablet Take 1 tablet (4 mg total) by mouth every 8 (eight) hours as needed for nausea or vomiting. 30 tablet 3  . Potassium Gluconate 595 MG CAPS Take 1 capsule by mouth daily.    . PSYLLIUM PO Take 1 tablet by mouth daily.    Marland Kitchen  traZODone (DESYREL) 100 MG tablet Take 1 tablet (100 mg total) by mouth at bedtime as needed. for sleep 90 tablet 11   No current facility-administered medications on file prior to visit.    Allergies  Allergen Reactions  . Lactose Intolerance (Gi) Nausea And Vomiting  . Other Swelling    All beans  . Isosorbide Itching    headaches  . Nitroglycerin Anxiety and Other (See Comments)    Pain and feels like he is on fire     Assessment/Plan:  1. Hypertension - BP remains above goal <130/95mmHg, but significant improvement from past visits.  Pt did not bring in home BP readings, however, at today's visit BP was 142/89 mmHg. Pt is currently taking amlodipine 10mg  daily, clonidine 0.3mg  TID, furosemide 20mg  prn - taking daily in AM, hydralazine 100mg  TID, labetalol 400mg  TID, Toprol 50mg  daily, doxazosin 4mg  at bedtime. Recommend increasing doxazosin from 4 mg daily to 8 mg daily. Will f/u with pt pm 05/21/19 to determine effects of dosage increase on BP. If possible in future visits would like to get patient off of two beta blockers as this is most likely contributing to his fatigue and in ability to exercise.  2.Tobacco abuse - Pt has made improvements with smoking (1 pack/2 days to 1 pack/week). Encouraged pt for his success. Pt hasnt been using nicotine patch daily. Encouraged patient to use nicotine patch daily and not buy any more cigarettes. He has been unable to obtain nicotine gum. Pt is interested  in using gum and stated he is willing to try again to obtain from pharmacy. Will f/u with pt regarding smoking cessation at f/u visit.   Sherren Kerns, PharmD PGY1 Acute Care Pharmacy Resident  Ramond Dial, Pharm.D, [facility]  [address] [place], [place] [address]  Phone: [phone]; Fax: [phone]  03/15/2020 9:56 PM

## 2020-03-16 ENCOUNTER — Telehealth: Payer: Self-pay | Admitting: Pharmacist

## 2020-03-16 ENCOUNTER — Ambulatory Visit: Payer: Medicaid Other

## 2020-03-16 NOTE — Telephone Encounter (Signed)
Pt missed appt today for BP management, this is his 4th no show to see the PharmD, also has multiple no shows with Dr Marlou Porch, most recently for appt 2 weeks ago. Previously a pt of Dr [employer] with nephrology as well, however pt was discharged due to multiple no shows. SCr has ranged 4-7 over the past year. Needs nephrologist and close BP management which has been made difficult by pt's frequent missed appts. Called pt to reschedule BP visit from today, he states he just woke up and forgot about his appt. Rescheduled appt for tomorrow.

## 2020-03-16 NOTE — Progress Notes (Signed)
Patient ID: Stanley [name]                 DOB: 1975/10/24                      MRN: [id number]     HPI: Stanley [name] is a 44 y.o. male referred by Stanley [name] to HTN clinic. PMH is significant for PMH is significant for hypertensive emergency, LVH, chest pain, CKD stage IV, stroke, HLD, tobacco abuse, and anxiety/depression. Patient's BP was previously managed by Stanley [name], however, he has not been seen by Stanley [name] since 06/2018. At last appt with pharmacy team, 04/24/2019, patient's blood pressure was 142/89 mmHg and pulse 66. Doxazosin was increased from 4 mg daily to 8 mg daily. He has had a history of multiple missed appts over the past year. ACEi/ARB, thiazide diuretics, and aldosterone antagonists have been avoided due to kidney fxn.  Patient presents for follow up appt with pharmacy team. He is not using nicotine patch - prefers to quit smoking cold [place]. Has decreased smoking cigarettes from 1 PPD to 3 cigarettes/day, congratulated pt on his success. No pseudoephedrine use. Confirms increasing labatelol dose from 400 mg to 600 mg three times daily - tolerating without side effects. Patient denied angina/SOB/unilateral weakness/speech slurring. He currently was experiencing a headache (originated from the back of the head). He has experienced unilateral weakness on right side of the body previously - confirmed he was not currently experiencing now. Called and spoke with patient's mother to confirm medication regimen - she confirmed patient was taking clonidine 0.3mg  TID, furosemide 20mg  prn - taking daily in AM, hydralazine 100mg  TID, labetalol 600mg  TID,  doxazosin 8 mg at bedtime. Confirmed he stopped amlodipine, metoprolol, and potassium. Unclear why amlodipine was discontinued, looks like this may have been accidentally after ED visit for hypertensive urgency last fall. She also confirmed patient re-established with his nephrologist, Stanley [name] on 02/11/2020 and he has future appt at end  of the month (03/2020). Patient states Stanley [name] has mentioned dialysis before, but he is scared to pursue dialysis. Patient reports monitoring BP using arm cuff, however, did not bring readings to OV.   [facility] fill history: Amlodipine 10mg  daily - has never filled  Clonidine 0.3mg  TID - last filled 6/9 for 30 days  Furosemide 20mg  prn (taking daily) - 5/17 for 90 days  Hydralazine 100mg  TID - ready for pickup, last picked up 5/20 for 30 days  Labetalol 600mg  TID - last filled 6/9 partial fill  Toprol 50mg  daily - has never filled  Doxazosin 8mg  - last filled 6/9 for 30 days  Current HTN meds: clonidine 0.3mg  TID, furosemide 20mg  prn - taking daily in AM, hydralazine 100mg  TID, labetalol 600mg  TID,  doxazosin 8 mg daily   Previously tried: isosorbide (headaches)  BP goal: <130/53mmHg  Family History: The patient'sfamily history includes Diabetes type II in his brother; Hypertension in his brother and mother.  Social History: Currently smokes 3 cigarettes daily, denies alcohol use, occasionally uses marijuana  Diet: Breakfast - [place] sausage, eggs, oranges. Lunch (if he is awake) - sandwich. Eats a lot of vegetables. Dinner - meat, starch, veggie (rarely eats starch, cut out all red meat). Mostly eats chicken, veal, or [place]. Does not add any salt to food, mom buys foods with < 10% sodium. Caffeine is limited to 1 cup of coffee each day, occasionally sweet tea. -No soda -Canned and fresh vegetables - washes canned vegetables -Fast food/fried  food - less than once or twice per month -Frozen foods - denies use  Exercise: Limited - medication makes him very tired/weak however wants to increase exercise   Home BP readings (per patient's memory): 170/120 mmHg  Wt Readings from Last 3 Encounters:  03/10/20 196 lb 12.8 oz (89.3 kg)  02/17/20 201 lb 9.6 oz (91.4 kg)  12/11/19 197 lb 3.2 oz (89.4 kg)   BP Readings from Last 3 Encounters:  03/10/20 (!) 215/108  02/17/20  (!) 242/110  11/18/19 (!) 147/86   Pulse Readings from Last 3 Encounters:  03/10/20 66  02/17/20 63  11/18/19 64    Renal function: CrCl cannot be calculated (Patient's most recent lab result is older than the maximum 21 days allowed.).  Past Medical History:  Diagnosis Date   Anxiety    CKD (chronic kidney disease), stage IV ([facility])    Depression    Dyslexia    Heart palpitations    High cholesterol    Hypertension    Hypertensive urgency    Polysubstance (including opioids) dependence, binge pattern (Stanley [name]) 08/2020   Stroke (Stanley [name]) 06/03/2017   "minor; completely recovered today" (06/13/2017)   Tobacco abuse     Current Outpatient Medications on File Prior to Visit  Medication Sig Dispense Refill   acetaminophen (TYLENOL) 500 MG tablet Take 1,000 mg by mouth as needed for mild pain.     albuterol (VENTOLIN HFA) 108 (90 Base) MCG/ACT inhaler Inhale 2 puffs into the lungs every 4 (four) hours as needed for wheezing or shortness of breath (cough, shortness of breath or wheezing.). 8 g 12   aspirin EC 81 MG tablet Take 81 mg by mouth daily.     atorvastatin (LIPITOR) 40 MG tablet Take 1 tablet (40 mg total) by mouth daily. Please keep upcoming appt in June with Stanley [name] before anymore refills. Thank you 30 tablet 11   cloNIDine (CATAPRES) 0.3 MG tablet Take 1 tablet (0.3 mg total) by mouth 3 (three) times daily. 90 tablet 6   doxazosin (CARDURA) 8 MG tablet Take 1 tablet (8 mg total) by mouth daily. 30 tablet 11   folic acid (FOLVITE) 676 MCG tablet Take 0.5 tablets (400 mcg total) by mouth daily. 30 tablet 11   furosemide (LASIX) 20 MG tablet Take 1 tablet (20 mg total) by mouth daily as needed. 90 tablet 11   hydrALAZINE (APRESOLINE) 100 MG tablet Take 1 tablet (100 mg total) by mouth 3 (three) times daily. 90 tablet 11   ibuprofen (ADVIL) 800 MG tablet Take 1 tablet (800 mg total) by mouth every 8 (eight) hours as needed. 30 tablet 3   labetalol (NORMODYNE)  200 MG tablet Take 3 tablets (600 mg total) by mouth 3 (three) times daily. Please keep appointment on June 14,2021 for additional refills 135 tablet 0   Multiple Vitamin (MULTIVITAMIN WITH MINERALS) TABS tablet Take 1 tablet by mouth daily as needed.     ondansetron (ZOFRAN ODT) 4 MG disintegrating tablet Take 1 tablet (4 mg total) by mouth every 8 (eight) hours as needed for nausea or vomiting. 30 tablet 3   Potassium Gluconate 595 MG CAPS Take 1 capsule by mouth daily.     PSYLLIUM PO Take 1 tablet by mouth daily.     traZODone (DESYREL) 100 MG tablet Take 1 tablet (100 mg total) by mouth at bedtime as needed. for sleep 90 tablet 11   No current facility-administered medications on file prior to visit.    Allergies  Allergen Reactions   Lactose Intolerance (Gi) Nausea And Vomiting   Other Swelling    All beans   Isosorbide Itching    headaches   Nitroglycerin Anxiety and Other (See Comments)    Pain and feels like he is on fire     Assessment/Plan:  1. Hypertension - BP goal <130/80 mmHg, therefore, patient is EXTREMELY above goal. BP originally 240/106 mmHg --> provided patient clonidine 0.2 mg --> BP decreased to 220/104 mmHg. Thoroughly discussed s/sx of stroke and MI - pt denied experiencing SOB/angina/unilateral weakness/slurred speech currently. He was experiencing a headache, however, this is likely due to his elevated BP. Explained to patient his risk of stroke considering elevated BP and advised patient to go to ED. Patient refused to go to ED. Stated if he experienced any additional concerning symptoms of stroke or MI then he would go to ED. Will restart amlodipine 10 mg daily (take in AM) and increase doxazosin from 8 mg daily to 16 mg daily (take in PM). Continue clonidine 0.3 mg TID, hydralazine 100 mg TID, and labetalol 600 mg TID. Refilled all HTN meds and provided 90 day supply to help with adherence (clonidine, hydralazine, labetalol). Advised patient to NOT take  NSAIDs. Advised patient to make follow up appt with Stanley [name] (has not been seen >1 year). Obtained updated labs (CMET, lipid panel, direct LDL) today. Advised patient to monitor and record BP daily to bring to follow up appt with nephrologist (Stanley [name]) at the end of the month (03/2020). Will reach out to nephrologist after follow-up to see if they plan to manage BP long term as options are limited and pt likely needs dialysis which may help with BP.  Thank you for involving pharmacy to assist in providing this patient's care.   Drexel Iha, PharmD PGY2 Ambulatory Care Pharmacy Resident

## 2020-03-17 ENCOUNTER — Other Ambulatory Visit: Payer: Self-pay

## 2020-03-17 ENCOUNTER — Ambulatory Visit (INDEPENDENT_AMBULATORY_CARE_PROVIDER_SITE_OTHER): Payer: Medicaid Other | Admitting: Pharmacist

## 2020-03-17 VITALS — BP 220/104 | HR 60

## 2020-03-17 DIAGNOSIS — I10 Essential (primary) hypertension: Secondary | ICD-10-CM

## 2020-03-17 DIAGNOSIS — E782 Mixed hyperlipidemia: Secondary | ICD-10-CM | POA: Diagnosis not present

## 2020-03-17 MED ORDER — DOXAZOSIN MESYLATE 8 MG PO TABS: 16.0000 mg | ORAL_TABLET | Freq: Every day | ORAL | 3 refills | Status: DC

## 2020-03-17 MED ORDER — HYDRALAZINE HCL 100 MG PO TABS: 100.0000 mg | ORAL_TABLET | Freq: Three times a day (TID) | ORAL | 3 refills | Status: DC

## 2020-03-17 MED ORDER — CLONIDINE HCL 0.3 MG PO TABS: 0.3000 mg | ORAL_TABLET | Freq: Three times a day (TID) | ORAL | 3 refills | Status: DC

## 2020-03-17 MED ORDER — LABETALOL HCL 200 MG PO TABS: 600.0000 mg | ORAL_TABLET | Freq: Three times a day (TID) | ORAL | 3 refills | Status: DC

## 2020-03-17 MED ORDER — AMLODIPINE BESYLATE 10 MG PO TABS: 10.0000 mg | ORAL_TABLET | Freq: Every day | ORAL | 3 refills | Status: DC

## 2020-03-17 NOTE — Patient Instructions (Addendum)
It was a pleasure seeing you in clinic today!  Today the plan is... 1. START amlodipine 10 mg daily (take in the AM, one tablet) 2. INCREASE doxazosin 8 mg daily (1 tablet) to 16 mg daily (2 tablets, take in PM) 3. Continue clonidine 0.3 mg three times daily (1 tablet), hydralazine 100 mg three times daily (1 tablet), and labetalol 600 mg three times daily (three tablets) 4. Continue furosemide as needed 5. Go to ED if you feel weakness on one side of body, chest pain, slurred speech, or worsening headache.   6. Do NOT take ibuprofen/motrin/advil/alleve/naproxen for pain. ONLY take Tylenol 7. Make sure to go to Dr. [name] appointment. It is very important he helps manage your blood pressure!!  Please call the PharmD clinic at [phone] if you have any questions that you would like to speak with a pharmacist about Stanley [name], [place], [place]).

## 2020-03-18 ENCOUNTER — Other Ambulatory Visit: Payer: Self-pay | Admitting: Family Medicine

## 2020-03-18 DIAGNOSIS — I509 Heart failure, unspecified: Secondary | ICD-10-CM

## 2020-03-18 LAB — LIPID PANEL
Chol/HDL Ratio: 2.5 ratio (ref 0.0–5.0)
Cholesterol, Total: 91 mg/dL — ABNORMAL LOW (ref 100–199)
HDL: 36 mg/dL — ABNORMAL LOW (ref >39)
LDL Chol Calc (NIH): 41 mg/dL (ref 0–99)
Triglycerides: 62 mg/dL (ref 0–149)
VLDL Cholesterol Cal: 14 mg/dL (ref 5–40)

## 2020-03-18 LAB — COMPREHENSIVE METABOLIC PANEL
ALT: 6 IU/L (ref 0–44)
AST: 9 IU/L (ref 0–40)
Albumin/Globulin Ratio: 1.9 (ref 1.2–2.2)
Albumin: 4.3 g/dL (ref 4.0–5.0)
Alkaline Phosphatase: 103 IU/L (ref 48–121)
BUN/Creatinine Ratio: 9 (ref 9–20)
BUN: 38 mg/dL — ABNORMAL HIGH (ref 6–24)
Bilirubin Total: 0.3 mg/dL (ref 0.0–1.2)
CO2: 21 mmol/L (ref 20–29)
Calcium: 9.1 mg/dL (ref 8.7–10.2)
Chloride: 106 mmol/L (ref 96–106)
Creatinine, Ser: 4.24 mg/dL — ABNORMAL HIGH (ref 0.76–1.27)
GFR calc Af Amer: 19 mL/min/{1.73_m2} — ABNORMAL LOW (ref >59)
GFR calc non Af Amer: 16 mL/min/{1.73_m2} — ABNORMAL LOW (ref >59)
Globulin, Total: 2.3 g/dL (ref 1.5–4.5)
Glucose: 83 mg/dL (ref 65–99)
Potassium: 4.7 mmol/L (ref 3.5–5.2)
Sodium: 141 mmol/L (ref 134–144)
Total Protein: 6.6 g/dL (ref 6.0–8.5)

## 2020-03-18 LAB — LDL CHOLESTEROL, DIRECT: LDL Direct: 42 mg/dL (ref 0–99)

## 2020-03-18 MED ORDER — ALBUTEROL SULFATE HFA 108 (90 BASE) MCG/ACT IN AERS: 1.0000 | INHALATION_SPRAY | Freq: Four times a day (QID) | RESPIRATORY_TRACT | 11 refills | Status: DC | PRN

## 2020-03-18 MED FILL — PROAIR HFA 90 MCG INHALER: 108 (90 BAS | 25 days supply | Qty: 9 | Fill #0

## 2020-03-30 ENCOUNTER — Telehealth: Payer: Self-pay | Admitting: Pharmacist

## 2020-03-30 NOTE — Telephone Encounter (Signed)
Left message for Stanley [name] at Dr [employer] office with [facility] to see how his BP was at hist most recent visit and what their plans are with BP management moving forward as medication options are very limited.

## 2020-04-01 ENCOUNTER — Telehealth: Payer: Self-pay | Admitting: Family Medicine

## 2020-04-01 NOTE — Telephone Encounter (Signed)
Pts mother called wanting an appt to be scheduled with Dr. Shelva Majestic at [facility] regarding his sleep study.

## 2020-04-02 ENCOUNTER — Other Ambulatory Visit: Payer: Self-pay | Admitting: Family Medicine

## 2020-04-02 DIAGNOSIS — I509 Heart failure, unspecified: Secondary | ICD-10-CM

## 2020-04-02 DIAGNOSIS — G4730 Sleep apnea, unspecified: Secondary | ICD-10-CM

## 2020-04-02 DIAGNOSIS — I10 Essential (primary) hypertension: Secondary | ICD-10-CM

## 2020-04-10 ENCOUNTER — Ambulatory Visit: Payer: Medicaid Other | Admitting: Family Medicine

## 2020-04-14 ENCOUNTER — Ambulatory Visit (INDEPENDENT_AMBULATORY_CARE_PROVIDER_SITE_OTHER): Payer: Medicaid Other | Admitting: Family Medicine

## 2020-04-14 ENCOUNTER — Other Ambulatory Visit: Payer: Self-pay

## 2020-04-14 ENCOUNTER — Encounter: Payer: Self-pay | Admitting: Family Medicine

## 2020-04-14 VITALS — BP 206/116 | HR 66 | Temp 98.8°F | Resp 16 | Wt 194.2 lb

## 2020-04-14 DIAGNOSIS — R8290 Unspecified abnormal findings in urine: Secondary | ICD-10-CM

## 2020-04-14 DIAGNOSIS — G4700 Insomnia, unspecified: Secondary | ICD-10-CM

## 2020-04-14 DIAGNOSIS — Z09 Encounter for follow-up examination after completed treatment for conditions other than malignant neoplasm: Secondary | ICD-10-CM

## 2020-04-14 DIAGNOSIS — G4730 Sleep apnea, unspecified: Secondary | ICD-10-CM | POA: Diagnosis not present

## 2020-04-14 DIAGNOSIS — R5383 Other fatigue: Secondary | ICD-10-CM

## 2020-04-14 DIAGNOSIS — N433 Hydrocele, unspecified: Secondary | ICD-10-CM

## 2020-04-14 DIAGNOSIS — I10 Essential (primary) hypertension: Secondary | ICD-10-CM | POA: Diagnosis not present

## 2020-04-14 DIAGNOSIS — F419 Anxiety disorder, unspecified: Secondary | ICD-10-CM

## 2020-04-14 DIAGNOSIS — I509 Heart failure, unspecified: Secondary | ICD-10-CM

## 2020-04-14 LAB — POCT URINALYSIS DIPSTICK
Bilirubin, UA: NEGATIVE
Blood, UA: NEGATIVE
Glucose, UA: NEGATIVE
Ketones, UA: NEGATIVE
Nitrite, UA: NEGATIVE
Protein, UA: POSITIVE — AB
Spec Grav, UA: 1.025 (ref 1.010–1.025)
Urobilinogen, UA: 0.2 E.U./dL
pH, UA: 7 (ref 5.0–8.0)

## 2020-04-14 LAB — GLUCOSE, POCT (MANUAL RESULT ENTRY): POC Glucose: 86 mg/dl (ref 70–99)

## 2020-04-14 LAB — POCT GLYCOSYLATED HEMOGLOBIN (HGB A1C)
HbA1c POC (<> result, manual entry): 5 % (ref 4.0–5.6)
HbA1c, POC (controlled diabetic range): 5 % (ref 0.0–7.0)
HbA1c, POC (prediabetic range): 5 % — AB (ref 5.7–6.4)
Hemoglobin A1C: 5 % (ref 4.0–5.6)

## 2020-04-14 MED ORDER — CLONIDINE HCL 0.2 MG PO TABS
0.2000 mg | ORAL_TABLET | Freq: Every day | ORAL | Status: AC
  Administered 2020-04-14: 0.2 mg via ORAL

## 2020-04-14 MED ORDER — TRAZODONE HCL 150 MG PO TABS: 150.0000 mg | ORAL_TABLET | Freq: Every day | ORAL | 6 refills | Status: DC

## 2020-04-14 NOTE — Progress Notes (Signed)
[name]   Established Patient Office Visit  Subjective:  Patient ID: [name], male    DOB: September 27, 1976  Age: 44 y.o. MRN: [id number]  CC:  Chief Complaint  Patient presents with  . Follow-up    Pt states he is here for his f/u. Pt states his sleeping medicine is not working.Marland Kitchen    HPI Stanley [name] is a 44 year old male who presents for Follow Up today.     Patient Active Problem List   Diagnosis Date Noted  . Observed sleep apnea 12/11/2019  . Pain in both testicles 11/19/2019  . Hypertensive crisis 08/23/2019  . Acute posterior epistaxis 08/23/2019  . Heart palpitations 02/23/2019  . Anxiety 12/19/2018  . Acute pain of right knee 12/19/2018  . Inflammation of joint of right knee 12/19/2018  . Swollen testicle 12/19/2018  . Syncope and collapse   . Transient vision disturbance of left eye   . Hydrocele   . Hypertensive urgency 06/12/2017  . CKD stage 4 secondary to hypertension ([place]) 03/01/2017  . Anemia, chronic disease 03/01/2017  . Hyperparathyroidism, secondary renal ([place]) 03/01/2017  . Hypertensive emergency 09/26/2016  . Tobacco abuse 09/26/2016  . Hypertension 09/26/2016  . AKI (acute kidney injury) ([place]) 09/26/2016    Past Medical History:  Diagnosis Date  . Anxiety   . CKD (chronic kidney disease), stage IV ([place])   . Depression   . Dyslexia   . Heart palpitations   . High cholesterol   . Hypertension   . Hypertensive urgency   . Polysubstance (including opioids) dependence, binge pattern ([place]) 08/2020  . Stroke ([place]) 06/03/2017   "minor; completely recovered today" (06/13/2017)  . Tobacco abuse    Current Status: Since his last office visit, he states that he has not been getting enough sleep lately. He denies fevers, chills, fatigue, recent infections, weight loss, and night sweats. He has not had any headaches, visual changes, dizziness, and falls. No chest pain, heart palpitations, cough and  shortness of breath reported. Denies GI problems such as nausea, vomiting, diarrhea, and constipation. He has no reports of blood in stools, dysuria and hematuria. No depression or anxiety, and denies suicidal ideations, homicidal ideations, or auditory hallucinations. He is taking all medications as prescribed. He denies pain today.   Past Surgical History:  Procedure Laterality Date  . NO PAST SURGERIES      Family History  Problem Relation Age of Onset  . Hypertension Mother   . Hypertension Brother   . Diabetes type II Brother     Social History   Socioeconomic History  . Marital status: Married    Spouse name: Not on file  . Number of children: Not on file  . Years of education: Not on file  . Highest education level: Not on file  Occupational History  . Not on file  Tobacco Use  . Smoking status: Current Every Day Smoker    Packs/day: 1.00    Years: 30.00    Pack years: 30.00    Types: Cigarettes  . Smokeless tobacco: Never Used  Vaping Use  . Vaping Use: Never used  Substance and Sexual Activity  . Alcohol use: No  . Drug use: Yes    Types: Marijuana    Comment: 06/13/2017 "not often"  . Sexual activity: Not Currently  Other Topics Concern  . Not on file  Social History Narrative  . Not on file   Social Determinants of  Health   Financial Resource Strain:   . Difficulty of Paying Living Expenses:   Food Insecurity:   . Worried About Charity fundraiser in the Last Year:   . Arboriculturist in the Last Year:   Transportation Needs:   . Film/video editor (Medical):   Marland Kitchen Lack of Transportation (Non-Medical):   Physical Activity:   . Days of Exercise per Week:   . Minutes of Exercise per Session:   Stress:   . Feeling of Stress :   Social Connections:   . Frequency of Communication with Friends and Family:   . Frequency of Social Gatherings with Friends and Family:   . Attends Religious Services:   . Active Member of Clubs or Organizations:   .  Attends Archivist Meetings:   Marland Kitchen Marital Status:   Intimate Partner Violence:   . Fear of Current or Ex-Partner:   . Emotionally Abused:   Marland Kitchen Physically Abused:   . Sexually Abused:     Outpatient Medications Prior to Visit  Medication Sig Dispense Refill  . acetaminophen (TYLENOL) 500 MG tablet Take 1,000 mg by mouth as needed for mild pain.    Marland Kitchen albuterol (PROAIR HFA) 108 (90 Base) MCG/ACT inhaler Inhale 1-2 puffs into the lungs every 6 (six) hours as needed for wheezing or shortness of breath. 18 g 11  . amLODipine (NORVASC) 10 MG tablet Take 1 tablet (10 mg total) by mouth daily. 90 tablet 3  . aspirin EC 81 MG tablet Take 81 mg by mouth daily.    Marland Kitchen atorvastatin (LIPITOR) 40 MG tablet Take 1 tablet (40 mg total) by mouth daily. Please keep upcoming appt in June with Dr. Marlou Porch before anymore refills. Thank you 30 tablet 11  . cloNIDine (CATAPRES) 0.3 MG tablet Take 1 tablet (0.3 mg total) by mouth 3 (three) times daily. 270 tablet 3  . doxazosin (CARDURA) 8 MG tablet Take 2 tablets (16 mg total) by mouth daily. 283 tablet 3  . folic acid (FOLVITE) 662 MCG tablet Take 0.5 tablets (400 mcg total) by mouth daily. 30 tablet 11  . furosemide (LASIX) 20 MG tablet Take 1 tablet (20 mg total) by mouth daily as needed. 90 tablet 11  . hydrALAZINE (APRESOLINE) 100 MG tablet Take 1 tablet (100 mg total) by mouth 3 (three) times daily. 270 tablet 3  . ibuprofen (ADVIL) 800 MG tablet Take 1 tablet (800 mg total) by mouth every 8 (eight) hours as needed. 30 tablet 3  . labetalol (NORMODYNE) 200 MG tablet Take 3 tablets (600 mg total) by mouth 3 (three) times daily. 810 tablet 3  . Multiple Vitamin (MULTIVITAMIN WITH MINERALS) TABS tablet Take 1 tablet by mouth daily as needed.    . ondansetron (ZOFRAN ODT) 4 MG disintegrating tablet Take 1 tablet (4 mg total) by mouth every 8 (eight) hours as needed for nausea or vomiting. 30 tablet 3  . PSYLLIUM PO Take 1 tablet by mouth daily.    .  traZODone (DESYREL) 100 MG tablet Take 1 tablet (100 mg total) by mouth at bedtime as needed. for sleep 90 tablet 11   No facility-administered medications prior to visit.    Allergies  Allergen Reactions  . Lactose Intolerance (Gi) Nausea And Vomiting  . Nitroglycerin Anxiety and Other (See Comments)    Pain and feels like he is on fire  . Other Swelling    All beans  . Isosorbide Itching    headaches  ROS Review of Systems  Constitutional: Negative.   HENT: Negative.   Eyes: Negative.   Respiratory: Negative.   Cardiovascular: Negative.   Gastrointestinal: Negative.   Endocrine: Negative.   Genitourinary: Negative.   Musculoskeletal: Negative.   Skin: Negative.   Allergic/Immunologic: Negative.   Neurological: Positive for dizziness (occasional) and headaches (occasional ).  Hematological: Negative.   Psychiatric/Behavioral: Positive for sleep disturbance (insomnia).      Objective:    Physical Exam Vitals and nursing note reviewed.  Constitutional:      Appearance: Normal appearance.  HENT:     Head: Normocephalic and atraumatic.     Nose: Nose normal.     Mouth/Throat:     Mouth: Mucous membranes are moist.     Pharynx: Oropharynx is clear.  Cardiovascular:     Rate and Rhythm: Normal rate and regular rhythm.     Pulses: Normal pulses.     Heart sounds: Normal heart sounds.  Pulmonary:     Effort: Pulmonary effort is normal.     Breath sounds: Normal breath sounds.  Abdominal:     General: Bowel sounds are normal.     Palpations: Abdomen is soft.  Musculoskeletal:        General: Normal range of motion.     Cervical back: Normal range of motion and neck supple.  Skin:    General: Skin is warm and dry.  Neurological:     General: No focal deficit present.     Mental Status: He is alert and oriented to person, place, and time.  Psychiatric:        Mood and Affect: Mood normal.        Behavior: Behavior normal.        Thought Content: Thought  content normal.        Judgment: Judgment normal.     BP (!) 206/116 (BP Location: Left Arm, Patient Position: Sitting, Cuff Size: Normal)   Pulse 66   Temp 98.8 F (37.1 C)   Resp 16   Wt 194 lb 3.2 oz (88.1 kg)   SpO2 100%   BMI 27.09 kg/m  Wt Readings from Last 3 Encounters:  04/14/20 194 lb 3.2 oz (88.1 kg)  03/10/20 196 lb 12.8 oz (89.3 kg)  02/17/20 201 lb 9.6 oz (91.4 kg)     Health Maintenance Due  Topic Date Due  . Hepatitis C Screening  Never done  . COVID-19 Vaccine (1) Never done  . TETANUS/TDAP  Never done    There are no preventive care reminders to display for this patient.  Lab Results  Component Value Date   TSH 1.310 03/01/2018   Lab Results  Component Value Date   WBC 4.2 08/27/2019   HGB 8.9 (L) 08/27/2019   HCT 27.1 (L) 08/27/2019   MCV 91.9 08/27/2019   PLT 220 08/27/2019   Lab Results  Component Value Date   NA 141 03/17/2020   K 4.7 03/17/2020   CO2 21 03/17/2020   GLUCOSE 83 03/17/2020   BUN 38 (H) 03/17/2020   CREATININE 4.24 (H) 03/17/2020   BILITOT 0.3 03/17/2020   ALKPHOS 103 03/17/2020   AST 9 03/17/2020   ALT 6 03/17/2020   PROT 6.6 03/17/2020   ALBUMIN 4.3 03/17/2020   CALCIUM 9.1 03/17/2020   ANIONGAP 12 08/27/2019   Lab Results  Component Value Date   CHOL 91 (L) 03/17/2020   Lab Results  Component Value Date   HDL 36 (L) 03/17/2020   Lab  Results  Component Value Date   LDLCALC 41 03/17/2020   Lab Results  Component Value Date   TRIG 62 03/17/2020   Lab Results  Component Value Date   CHOLHDL 2.5 03/17/2020   Lab Results  Component Value Date   HGBA1C 4.9 03/01/2018      Assessment & Plan:   1. Hypertension, accelerated Blood pressures are elevated today. Clonidine 0.2 mg given to patient in office and blood pressures remain elevated. We referred him to ED via ambulance at this time. Patient refused and signed AMA form at discharge. He denies severe headaches, confusion, seizures, double vision,  and blurred vision, nausea and vomiting. He will report to ED if he experiences these symptoms. Patient verbalized understanding.   - POC Glucose (CBG) - POC HgB A1c - Urinalysis Dipstick  2. Hypertension, unspecified type  3. Insomnia, unspecified type Trazodone dosage increased today.  - traZODone (DESYREL) 150 MG tablet; Take 1 tablet (150 mg total) by mouth at bedtime.  Dispense: 30 tablet; Refill: 6  4. Observed sleep apne - Ambulatory referral to Sleep Studies  5. Fatigue, unspecified type - Ambulatory referral to Sleep Studies  6. Chronic congestive heart failure, unspecified heart failure type ([place])  7. Anxiety  8. Hydrocele, bilateral We will assess referral to Urology for surgery.   9. Follow up He will follow up in 1 month.   Meds ordered this encounter  Medications  . traZODone (DESYREL) 150 MG tablet    Sig: Take 1 tablet (150 mg total) by mouth at bedtime.    Dispense:  30 tablet    Refill:  6    Orders Placed This Encounter  Procedures  . Ambulatory referral to Sleep Studies  . POC Glucose (CBG)  . POC HgB A1c  . Urinalysis Dipstick     Referral Orders     Ambulatory referral to Sleep Studies   Kathe Becton,  MSN, FNP-BC [place] [place], [place] [address] [phone] fax    Problem List Items Addressed This Visit      Cardiovascular and Mediastinum   Hypertension     Respiratory   Observed sleep apnea   Relevant Orders   Ambulatory referral to Sleep Studies     Other   Anxiety   Relevant Medications   traZODone (DESYREL) 150 MG tablet    Other Visit Diagnoses    Hypertension, accelerated    -  Primary   Relevant Orders   POC Glucose (CBG)   POC HgB A1c   Urinalysis Dipstick   Insomnia, unspecified type       Relevant Medications   traZODone (DESYREL) 150 MG tablet   Fatigue, unspecified type        Relevant Orders   Ambulatory referral to Sleep Studies   Chronic congestive heart failure, unspecified heart failure type ([place])       Hydrocele, bilateral       Follow up          Meds ordered this encounter  Medications  . traZODone (DESYREL) 150 MG tablet    Sig: Take 1 tablet (150 mg total) by mouth at bedtime.    Dispense:  30 tablet    Refill:  6    Follow-up: No follow-ups on file.    [name], FNP

## 2020-04-16 LAB — URINE CULTURE

## 2020-05-08 ENCOUNTER — Ambulatory Visit: Payer: Medicaid Other | Admitting: Cardiology

## 2020-06-26 ENCOUNTER — Other Ambulatory Visit: Payer: Self-pay | Admitting: Family Medicine

## 2020-06-26 DIAGNOSIS — I10 Essential (primary) hypertension: Secondary | ICD-10-CM

## 2020-06-26 DIAGNOSIS — I509 Heart failure, unspecified: Secondary | ICD-10-CM

## 2020-06-29 ENCOUNTER — Ambulatory Visit: Payer: Medicaid Other | Admitting: Family Medicine

## 2020-07-10 ENCOUNTER — Other Ambulatory Visit: Payer: Self-pay

## 2020-07-10 ENCOUNTER — Encounter: Payer: Self-pay | Admitting: Family Medicine

## 2020-07-10 ENCOUNTER — Ambulatory Visit (INDEPENDENT_AMBULATORY_CARE_PROVIDER_SITE_OTHER): Payer: Medicaid Other | Admitting: Family Medicine

## 2020-07-10 VITALS — BP 218/110 | Temp 98.3°F | Ht 70.0 in | Wt 211.0 lb

## 2020-07-10 DIAGNOSIS — G4730 Sleep apnea, unspecified: Secondary | ICD-10-CM | POA: Diagnosis not present

## 2020-07-10 DIAGNOSIS — Z9119 Patient's noncompliance with other medical treatment and regimen: Secondary | ICD-10-CM | POA: Diagnosis not present

## 2020-07-10 DIAGNOSIS — R8290 Unspecified abnormal findings in urine: Secondary | ICD-10-CM

## 2020-07-10 DIAGNOSIS — R5383 Other fatigue: Secondary | ICD-10-CM

## 2020-07-10 DIAGNOSIS — Z09 Encounter for follow-up examination after completed treatment for conditions other than malignant neoplasm: Secondary | ICD-10-CM

## 2020-07-10 DIAGNOSIS — I10 Essential (primary) hypertension: Secondary | ICD-10-CM | POA: Diagnosis not present

## 2020-07-10 DIAGNOSIS — Z91199 Patient's noncompliance with other medical treatment and regimen due to unspecified reason: Secondary | ICD-10-CM

## 2020-07-10 DIAGNOSIS — G4700 Insomnia, unspecified: Secondary | ICD-10-CM

## 2020-07-10 DIAGNOSIS — F419 Anxiety disorder, unspecified: Secondary | ICD-10-CM

## 2020-07-10 DIAGNOSIS — R809 Proteinuria, unspecified: Secondary | ICD-10-CM

## 2020-07-10 LAB — POCT URINALYSIS DIPSTICK
Bilirubin, UA: NEGATIVE
Blood, UA: NEGATIVE
Glucose, UA: NEGATIVE
Ketones, UA: NEGATIVE
Leukocytes, UA: NEGATIVE
Nitrite, UA: NEGATIVE
Protein, UA: POSITIVE — AB
Spec Grav, UA: 1.025 (ref 1.010–1.025)
Urobilinogen, UA: 0.2 E.U./dL
pH, UA: 6.5 (ref 5.0–8.0)

## 2020-07-10 LAB — POCT GLYCOSYLATED HEMOGLOBIN (HGB A1C)
HbA1c POC (<> result, manual entry): 5 % (ref 4.0–5.6)
HbA1c, POC (controlled diabetic range): 5 % (ref 0.0–7.0)
HbA1c, POC (prediabetic range): 5 % — AB (ref 5.7–6.4)
Hemoglobin A1C: 5 % (ref 4.0–5.6)

## 2020-07-10 LAB — GLUCOSE, POCT (MANUAL RESULT ENTRY): POC Glucose: 108 mg/dl — AB (ref 70–99)

## 2020-07-10 MED ORDER — CLONIDINE HCL 0.1 MG PO TABS
0.1000 mg | ORAL_TABLET | Freq: Once | ORAL | Status: AC
Start: 2020-07-10 — End: 2020-07-10
  Administered 2020-07-10: 0.2 mg via ORAL

## 2020-07-10 NOTE — Progress Notes (Signed)
[name]   Established Patient Office Visit  Subjective:  Patient ID: [name], male    DOB: 1976-02-05  Age: 44 y.o. MRN: [id number]  CC:  Chief Complaint  Patient presents with  . Foot Swelling    X1 1/2 WKS Pt states both feet are swelling.    HPI Stanley [name] Is a 44 year old male who presents for Follow Up today.    Patient Active Problem List   Diagnosis Date Noted  . Observed sleep apnea 12/11/2019  . Pain in both testicles 11/19/2019  . Hypertensive crisis 08/23/2019  . Acute posterior epistaxis 08/23/2019  . Heart palpitations 02/23/2019  . Anxiety 12/19/2018  . Acute pain of right knee 12/19/2018  . Inflammation of joint of right knee 12/19/2018  . Swollen testicle 12/19/2018  . Syncope and collapse   . Transient vision disturbance of left eye   . Hydrocele   . Hypertensive urgency 06/12/2017  . CKD stage 4 secondary to hypertension ([place]) 03/01/2017  . Anemia, chronic disease 03/01/2017  . Hyperparathyroidism, secondary renal ([place]) 03/01/2017  . Hypertensive emergency 09/26/2016  . Tobacco abuse 09/26/2016  . Hypertension 09/26/2016  . AKI (acute kidney injury) ([place]) 09/26/2016   Current Status: Since his last office visit, He is doing well with no complaints. His blood pressures are elevated today. He states that he did drink coffee and he has not taken his hypertensive medications prior to appointment today. He denies illegal drug use and alcohol use. He admits to occasional heart palpitations and headaches. He denies visual changes, chest pain, cough, shortness of breath, and falls. He has occasional headaches and dizziness with position changes. Denies severe headaches,  confusion, seizures, double vision, and blurred vision, nausea and vomiting. He reports stress and anxiety r/t family and personal issues. He denies fevers, chills, fatigue, recent infections, weight loss, and night sweats. Denies GI  problems such as diarrhea, and constipation. He has no reports of blood in stools, dysuria and hematuria. He is taking all medications as prescribed. He denies pain today.   Past Medical History:  Diagnosis Date  . Anxiety   . CKD (chronic kidney disease), stage IV ([place])   . Depression   . Dyslexia   . Heart palpitations   . High cholesterol   . Hypertension   . Hypertensive urgency   . Polysubstance (including opioids) dependence, binge pattern ([place]) 08/2020  . Stroke ([place]) 06/03/2017   "minor; completely recovered today" (06/13/2017)  . Tobacco abuse     Past Surgical History:  Procedure Laterality Date  . NO PAST SURGERIES      Family History  Problem Relation Age of Onset  . Hypertension Mother   . Hypertension Brother   . Diabetes type II Brother     Social History   Socioeconomic History  . Marital status: Married    Spouse name: Not on file  . Number of children: Not on file  . Years of education: Not on file  . Highest education level: Not on file  Occupational History  . Not on file  Tobacco Use  . Smoking status: Current Every Day Smoker    Packs/day: 1.00    Years: 30.00    Pack years: 30.00    Types: Cigarettes  . Smokeless tobacco: Never Used  Vaping Use  . Vaping Use: Never used  Substance and Sexual Activity  . Alcohol use: No  . Drug use: Yes  Types: Marijuana    Comment: 06/13/2017 "not often"  . Sexual activity: Not Currently  Other Topics Concern  . Not on file  Social History Narrative  . Not on file   Social Determinants of Health   Financial Resource Strain:   . Difficulty of Paying Living Expenses: Not on file  Food Insecurity:   . Worried About Charity fundraiser in the Last Year: Not on file  . Ran Out of Food in the Last Year: Not on file  Transportation Needs:   . Lack of Transportation (Medical): Not on file  . Lack of Transportation (Non-Medical): Not on file  Physical Activity:   . Days of Exercise per Week: Not on  file  . Minutes of Exercise per Session: Not on file  Stress:   . Feeling of Stress : Not on file  Social Connections:   . Frequency of Communication with Friends and Family: Not on file  . Frequency of Social Gatherings with Friends and Family: Not on file  . Attends Religious Services: Not on file  . Active Member of Clubs or Organizations: Not on file  . Attends Archivist Meetings: Not on file  . Marital Status: Not on file  Intimate Partner Violence:   . Fear of Current or Ex-Partner: Not on file  . Emotionally Abused: Not on file  . Physically Abused: Not on file  . Sexually Abused: Not on file    Outpatient Medications Prior to Visit  Medication Sig Dispense Refill  . acetaminophen (TYLENOL) 500 MG tablet Take 1,000 mg by mouth as needed for mild pain.    Marland Kitchen albuterol (PROAIR HFA) 108 (90 Base) MCG/ACT inhaler Inhale 1-2 puffs into the lungs every 6 (six) hours as needed for wheezing or shortness of breath. 18 g 11  . aspirin EC 81 MG tablet Take 81 mg by mouth daily.    Marland Kitchen atorvastatin (LIPITOR) 40 MG tablet Take 1 tablet (40 mg total) by mouth daily. Please keep upcoming appt in June with Dr. Marlou Porch before anymore refills. Thank you 30 tablet 11  . cloNIDine (CATAPRES) 0.3 MG tablet Take 1 tablet (0.3 mg total) by mouth 3 (three) times daily. 270 tablet 3  . doxazosin (CARDURA) 8 MG tablet Take 2 tablets (16 mg total) by mouth daily. 224 tablet 3  . folic acid (FOLVITE) 825 MCG tablet Take 0.5 tablets (400 mcg total) by mouth daily. 30 tablet 11  . furosemide (LASIX) 20 MG tablet Take 1 tablet (20 mg total) by mouth daily as needed. 90 tablet 11  . hydrALAZINE (APRESOLINE) 100 MG tablet Take 1 tablet (100 mg total) by mouth 3 (three) times daily. 270 tablet 3  . ibuprofen (ADVIL) 800 MG tablet Take 1 tablet (800 mg total) by mouth every 8 (eight) hours as needed. 30 tablet 3  . labetalol (NORMODYNE) 200 MG tablet Take 3 tablets (600 mg total) by mouth 3 (three) times  daily. 810 tablet 3  . Multiple Vitamin (MULTIVITAMIN WITH MINERALS) TABS tablet Take 1 tablet by mouth daily as needed.    . ondansetron (ZOFRAN ODT) 4 MG disintegrating tablet Take 1 tablet (4 mg total) by mouth every 8 (eight) hours as needed for nausea or vomiting. 30 tablet 3  . PSYLLIUM PO Take 1 tablet by mouth daily.    . traZODone (DESYREL) 150 MG tablet Take 1 tablet (150 mg total) by mouth at bedtime. 30 tablet 6  . amLODipine (NORVASC) 10 MG tablet Take 1 tablet (  10 mg total) by mouth daily. 90 tablet 3   No facility-administered medications prior to visit.    Allergies  Allergen Reactions  . Lactose Intolerance (Gi) Nausea And Vomiting  . Nitroglycerin Anxiety and Other (See Comments)    Pain and feels like he is on fire  . Other Swelling    All beans  . Isosorbide Itching    headaches    ROS Review of Systems  Constitutional: Negative.   HENT: Negative.   Eyes: Negative.   Respiratory: Negative.   Cardiovascular: Negative.   Gastrointestinal: Negative.   Endocrine: Negative.   Genitourinary: Negative.   Musculoskeletal: Positive for arthralgias (generalized joint pain).  Skin: Negative.   Allergic/Immunologic: Negative.   Neurological: Positive for dizziness (occasional ) and headaches (occasional ).  Psychiatric/Behavioral: Negative.       Objective:    Physical Exam Vitals and nursing note reviewed.  Constitutional:      Appearance: Normal appearance.  HENT:     Head: Normocephalic and atraumatic.     Nose: Nose normal.     Mouth/Throat:     Mouth: Mucous membranes are moist.     Pharynx: Oropharynx is clear.  Cardiovascular:     Rate and Rhythm: Normal rate and regular rhythm.     Pulses: Normal pulses.     Heart sounds: Normal heart sounds.  Pulmonary:     Effort: Pulmonary effort is normal.     Breath sounds: Normal breath sounds.  Abdominal:     General: Bowel sounds are normal.     Palpations: Abdomen is soft.  Musculoskeletal:      Cervical back: Normal range of motion and neck supple.  Neurological:     Mental Status: He is alert.     BP (!) 218/110 (BP Location: Left Arm, Patient Position: Sitting, Cuff Size: Normal)   Temp 98.3 F (36.8 C)   Ht 5\' 10"  (1.778 m)   Wt 211 lb (95.7 kg)   SpO2 100%   BMI 30.28 kg/m  Wt Readings from Last 3 Encounters:  07/10/20 211 lb (95.7 kg)  04/14/20 194 lb 3.2 oz (88.1 kg)  03/10/20 196 lb 12.8 oz (89.3 kg)     Health Maintenance Due  Topic Date Due  . Hepatitis C Screening  Never done  . COVID-19 Vaccine (1) Never done  . TETANUS/TDAP  Never done  . INFLUENZA VACCINE  05/03/2020    There are no preventive care reminders to display for this patient.  Lab Results  Component Value Date   TSH 1.310 03/01/2018   Lab Results  Component Value Date   WBC 4.2 08/27/2019   HGB 8.9 (L) 08/27/2019   HCT 27.1 (L) 08/27/2019   MCV 91.9 08/27/2019   PLT 220 08/27/2019   Lab Results  Component Value Date   NA 141 03/17/2020   K 4.7 03/17/2020   CO2 21 03/17/2020   GLUCOSE 83 03/17/2020   BUN 38 (H) 03/17/2020   CREATININE 4.24 (H) 03/17/2020   BILITOT 0.3 03/17/2020   ALKPHOS 103 03/17/2020   AST 9 03/17/2020   ALT 6 03/17/2020   PROT 6.6 03/17/2020   ALBUMIN 4.3 03/17/2020   CALCIUM 9.1 03/17/2020   ANIONGAP 12 08/27/2019   Lab Results  Component Value Date   CHOL 91 (L) 03/17/2020   Lab Results  Component Value Date   HDL 36 (L) 03/17/2020   Lab Results  Component Value Date   LDLCALC 41 03/17/2020   Lab Results  Component Value Date   TRIG 62 03/17/2020   Lab Results  Component Value Date   CHOLHDL 2.5 03/17/2020   Lab Results  Component Value Date   HGBA1C 5.0 07/10/2020   HGBA1C 5.0 07/10/2020   HGBA1C 5.0 (A) 07/10/2020   HGBA1C 5.0 07/10/2020      Assessment & Plan:   1. Hypertension, unspecified type - Urinalysis Dipstick - POC Glucose (CBG) - POC HgB A1c - cloNIDine (CATAPRES) tablet 0.1 mg  2. Uncontrolled  hypertension Blood pressures are elevated today. Clonidine 0.3 mg given to patient in office and blood pressures remain elevated. We referred him to ED via ambulance at this time. Patient refused and signed AMA form at discharge. He denies severe headaches, confusion, seizures, double vision, and blurred vision, nausea and vomiting. He will report to ED if he experiences these symptoms. Patient verbalized understanding.    3. Hypertension, accelerated  4. Non compliance with medical treatment He states that he will begin taking anti=hypertensive medication as prescribed.   5. Observed sleep apnea  6. Anxiety  7. Insomnia, unspecified type  8. Fatigue, unspecified type  9. Abnormal urine finding - Urine Culture  10. Proteinuria, unspecified type - Microalbumin/Creatinine Ratio, Urine  11. Follow up  Meds ordered this encounter  Medications  . cloNIDine (CATAPRES) tablet 0.1 mg    Orders Placed This Encounter  Procedures  . Urine Culture  . Microalbumin/Creatinine Ratio, Urine  . Urinalysis Dipstick  . POC Glucose (CBG)  . POC HgB A1c    Referral Orders  No referral(s) requested today    Kathe Becton,  MSN, FNP-BC [place],  [phone]- fax   Problem List Items Addressed This Visit      Cardiovascular and Mediastinum   Hypertension - Primary   Relevant Orders   Urinalysis Dipstick (Completed)   POC Glucose (CBG) (Completed)   POC HgB A1c (Completed)     Respiratory   Observed sleep apnea     Other   Anxiety    Other Visit Diagnoses    Uncontrolled hypertension       Relevant Medications   cloNIDine (CATAPRES) tablet 0.1 mg (Completed)   Hypertension, accelerated       Relevant Medications   cloNIDine (CATAPRES) tablet 0.1 mg (Completed)   Non compliance with medical treatment       Insomnia, unspecified  type       Fatigue, unspecified type       Abnormal urine finding       Relevant Orders   Urine Culture (Completed)   Proteinuria, unspecified type       Relevant Orders   Microalbumin/Creatinine Ratio, Urine (Completed)   Follow up          Meds ordered this encounter  Medications  . cloNIDine (CATAPRES) tablet 0.1 mg    Follow-up: Return in about 1 month (around 08/10/2020).    [name], FNP

## 2020-07-11 LAB — MICROALBUMIN / CREATININE URINE RATIO
Creatinine, Urine: 91.2 mg/dL
Microalb/Creat Ratio: 1038 mg/g creat — ABNORMAL HIGH (ref 0–29)
Microalbumin, Urine: 946.6 ug/mL

## 2020-07-12 LAB — URINE CULTURE

## 2020-07-15 ENCOUNTER — Encounter: Payer: Self-pay | Admitting: Family Medicine

## 2020-07-15 NOTE — Progress Notes (Signed)
Pt  was called to discuss his lab results. Pt did not answer the phone so a message was left to give [place] a call back.

## 2020-07-20 ENCOUNTER — Telehealth: Payer: Self-pay | Admitting: Cardiology

## 2020-07-20 NOTE — Telephone Encounter (Signed)
Pt c/o swelling: STAT is pt has developed SOB within 24 hours  1) How much weight have you gained and in what time span? Yes not sure  2) If swelling, where is the swelling located? Feet and ankles and moving up the legs  3) Are you currently taking a fluid pill? yes  4) Are you currently SOB? yes  5) Do you have a log of your daily weights (if so, list)? no  6) Have you gained 3 pounds in a day or 5 pounds in a week? yes  7) Have you traveled recently? No

## 2020-07-20 NOTE — Telephone Encounter (Signed)
Spoke with pt's mother, DPR who reports pt has had SOB on exertion and lying flat with lower extremity edema x 1 week.  Pt has been elevating feet and legs above heart level during the day and taking Furosemide as prescribed. Pt's mother reports pt is also following a salt restrictive diet.  Reviewed foods with hidden sodium and encouraged no processed foods or soda.  Pt's current weight is 204 and he states he does not weigh daily.  Pt advised to take his blood pressure while on the phone with RN and reported a reading of 208/120.    Pt's mother advised pt's BP is dangerously high and with additional symptoms of SOB and swelling pt needs to go immediately to ED for further evaluation.  Pt refuses recommendation and states he is afraid of contracting Covid.  Pt is not vaccinated.  Pt advised again he must go to ED for evaluation.  Pt again refuses.  Pt's mother states she will try to get pt to present to ED today.

## 2020-08-03 DIAGNOSIS — F1120 Opioid dependence, uncomplicated: Secondary | ICD-10-CM

## 2020-08-03 HISTORY — DX: Opioid dependence, uncomplicated: F11.20

## 2020-08-04 ENCOUNTER — Telehealth: Payer: Self-pay | Admitting: Family Medicine

## 2020-08-06 NOTE — Telephone Encounter (Signed)
Done

## 2020-08-10 ENCOUNTER — Other Ambulatory Visit: Payer: Self-pay

## 2020-08-10 ENCOUNTER — Ambulatory Visit (INDEPENDENT_AMBULATORY_CARE_PROVIDER_SITE_OTHER): Payer: Medicaid Other | Admitting: Family Medicine

## 2020-08-10 ENCOUNTER — Encounter: Payer: Self-pay | Admitting: Family Medicine

## 2020-08-10 VITALS — BP 180/98 | HR 94 | Temp 98.8°F | Ht 70.0 in | Wt 207.8 lb

## 2020-08-10 DIAGNOSIS — Z9119 Patient's noncompliance with other medical treatment and regimen: Secondary | ICD-10-CM | POA: Diagnosis not present

## 2020-08-10 DIAGNOSIS — G4700 Insomnia, unspecified: Secondary | ICD-10-CM | POA: Diagnosis not present

## 2020-08-10 DIAGNOSIS — Z91199 Patient's noncompliance with other medical treatment and regimen due to unspecified reason: Secondary | ICD-10-CM

## 2020-08-10 DIAGNOSIS — F419 Anxiety disorder, unspecified: Secondary | ICD-10-CM

## 2020-08-10 DIAGNOSIS — Z09 Encounter for follow-up examination after completed treatment for conditions other than malignant neoplasm: Secondary | ICD-10-CM

## 2020-08-10 DIAGNOSIS — I10 Essential (primary) hypertension: Secondary | ICD-10-CM

## 2020-08-10 DIAGNOSIS — G4730 Sleep apnea, unspecified: Secondary | ICD-10-CM | POA: Diagnosis not present

## 2020-08-10 DIAGNOSIS — R5383 Other fatigue: Secondary | ICD-10-CM

## 2020-08-10 MED ORDER — CLONIDINE HCL 0.1 MG PO TABS
0.1000 mg | ORAL_TABLET | Freq: Once | ORAL | Status: AC
  Administered 2020-08-10: 0.1 mg via ORAL

## 2020-08-10 MED ORDER — CLONIDINE HCL 0.2 MG PO TABS
0.2000 mg | ORAL_TABLET | Freq: Once | ORAL | Status: AC
  Administered 2020-08-10: 0.2 mg via ORAL

## 2020-08-10 NOTE — Progress Notes (Signed)
Patient [facility]   Established Patient Office Visit  Subjective:  Patient ID: Stanley [name], male    DOB: [birth year]  Age: 44 y.o. MRN: [id number]  CC:  Chief Complaint  Patient presents with  . Follow-up    HPI Stanley [name] is a 44 year old male who presents for Follow Up today.    Patient Active Problem List   Diagnosis Date Noted  . Observed sleep apnea 12/11/2019  . Pain in both testicles 11/19/2019  . Hypertensive crisis 08/23/2019  . Acute posterior epistaxis 08/23/2019  . Heart palpitations 02/23/2019  . Anxiety 12/19/2018  . Acute pain of right knee 12/19/2018  . Inflammation of joint of right knee 12/19/2018  . Swollen testicle 12/19/2018  . Syncope and collapse   . Transient vision disturbance of left eye   . Hydrocele   . Hypertensive urgency 06/12/2017  . CKD stage 4 secondary to hypertension ([place]) 03/01/2017  . Anemia, chronic disease 03/01/2017  . Hyperparathyroidism, secondary renal ([place]) 03/01/2017  . Hypertensive emergency 09/26/2016  . Tobacco abuse 09/26/2016  . Hypertension 09/26/2016  . AKI (acute kidney injury) ([place]) 09/26/2016   Current Status: Since his last office visit, he is doing well with no complaints. He has not followed up with Cardiologist as needed. His blood pressures are elevated today. He states that he has been taking his medications as prescribed. He denies visual changes, chest pain, cough, shortness of breath, heart palpitations, and falls. He has occasional headaches and dizziness with position changes. Denies severe headaches, confusion, seizures, double vision, and blurred vision, nausea and vomiting. His anxiety is moderate today, r/t increased blood pressure today. He denies smoking cigarettes, drinking coffee, use of alcohol and illegal drugs today. He denies fevers, chills, fatigue, recent infections, weight loss, and night sweats. Denies GI problems such as diarrhea, and constipation.  He has no reports of blood in stools, dysuria and hematuria. He is taking all medications as prescribed. He denies pain today.   Past Medical History:  Diagnosis Date  . Anxiety   . CKD (chronic kidney disease), stage IV ([place])   . Depression   . Dyslexia   . Heart palpitations   . High cholesterol   . Hypertension   . Hypertensive urgency   . Polysubstance (including opioids) dependence, binge pattern ([place]) 08/2020  . Stroke ([place]) 06/03/2017   "minor; completely recovered today" (06/13/2017)  . Tobacco abuse     Past Surgical History:  Procedure Laterality Date  . NO PAST SURGERIES      Family History  Problem Relation Age of Onset  . Hypertension Mother   . Hypertension Brother   . Diabetes type II Brother     Social History   Socioeconomic History  . Marital status: Married    Spouse name: Not on file  . Number of children: Not on file  . Years of education: Not on file  . Highest education level: Not on file  Occupational History  . Not on file  Tobacco Use  . Smoking status: Current Every Day Smoker    Packs/day: 1.00    Years: 30.00    Pack years: 30.00    Types: Cigarettes  . Smokeless tobacco: Never Used  Vaping Use  . Vaping Use: Never used  Substance and Sexual Activity  . Alcohol use: No  . Drug use: Yes    Types: Marijuana    Comment: 06/13/2017 "not often"  . Sexual activity:  Not Currently  Other Topics Concern  . Not on file  Social History Narrative  . Not on file   Social Determinants of Health   Financial Resource Strain:   . Difficulty of Paying Living Expenses: Not on file  Food Insecurity:   . Worried About Charity fundraiser in the Last Year: Not on file  . Ran Out of Food in the Last Year: Not on file  Transportation Needs:   . Lack of Transportation (Medical): Not on file  . Lack of Transportation (Non-Medical): Not on file  Physical Activity:   . Days of Exercise per Week: Not on file  . Minutes of Exercise per Session: Not  on file  Stress:   . Feeling of Stress : Not on file  Social Connections:   . Frequency of Communication with Friends and Family: Not on file  . Frequency of Social Gatherings with Friends and Family: Not on file  . Attends Religious Services: Not on file  . Active Member of Clubs or Organizations: Not on file  . Attends Archivist Meetings: Not on file  . Marital Status: Not on file  Intimate Partner Violence:   . Fear of Current or Ex-Partner: Not on file  . Emotionally Abused: Not on file  . Physically Abused: Not on file  . Sexually Abused: Not on file    Outpatient Medications Prior to Visit  Medication Sig Dispense Refill  . acetaminophen (TYLENOL) 500 MG tablet Take 1,000 mg by mouth as needed for mild pain.    Marland Kitchen albuterol (PROAIR HFA) 108 (90 Base) MCG/ACT inhaler Inhale 1-2 puffs into the lungs every 6 (six) hours as needed for wheezing or shortness of breath. 18 g 11  . amLODipine (NORVASC) 10 MG tablet Take 1 tablet (10 mg total) by mouth daily. 90 tablet 3  . aspirin EC 81 MG tablet Take 81 mg by mouth daily.    Marland Kitchen atorvastatin (LIPITOR) 40 MG tablet Take 1 tablet (40 mg total) by mouth daily. Please keep upcoming appt in June with Dr. Marlou Porch before anymore refills. Thank you 30 tablet 11  . doxazosin (CARDURA) 8 MG tablet Take 2 tablets (16 mg total) by mouth daily. 161 tablet 3  . folic acid (FOLVITE) 096 MCG tablet Take 0.5 tablets (400 mcg total) by mouth daily. 30 tablet 11  . furosemide (LASIX) 20 MG tablet TAKE 1 TABLET BY MOUTH ONCE DAILY AS NEEDED 90 tablet 0  . hydrALAZINE (APRESOLINE) 100 MG tablet Take 1 tablet (100 mg total) by mouth 3 (three) times daily. 270 tablet 3  . ibuprofen (ADVIL) 800 MG tablet Take 1 tablet (800 mg total) by mouth every 8 (eight) hours as needed. 30 tablet 3  . labetalol (NORMODYNE) 200 MG tablet Take 3 tablets (600 mg total) by mouth 3 (three) times daily. 810 tablet 3  . Multiple Vitamin (MULTIVITAMIN WITH MINERALS) TABS  tablet Take 1 tablet by mouth daily as needed.    . ondansetron (ZOFRAN ODT) 4 MG disintegrating tablet Take 1 tablet (4 mg total) by mouth every 8 (eight) hours as needed for nausea or vomiting. 30 tablet 3  . PSYLLIUM PO Take 1 tablet by mouth daily.    . traZODone (DESYREL) 150 MG tablet Take 1 tablet (150 mg total) by mouth at bedtime. 30 tablet 6  . cloNIDine (CATAPRES) 0.3 MG tablet Take 1 tablet (0.3 mg total) by mouth 3 (three) times daily. 270 tablet 3   No facility-administered medications prior  to visit.    Allergies  Allergen Reactions  . Lactose Intolerance (Gi) Nausea And Vomiting  . Nitroglycerin Anxiety and Other (See Comments)    Pain and feels like he is on fire  . Other Swelling    All beans  . Isosorbide Itching    headaches    ROS Review of Systems  Constitutional: Negative.   HENT: Negative.   Eyes: Negative.   Respiratory: Positive for cough (occasional).   Cardiovascular: Negative.   Gastrointestinal: Negative.   Endocrine: Negative.   Genitourinary: Negative.   Musculoskeletal: Positive for arthralgias (generalized joint pain).  Skin: Negative.   Allergic/Immunologic: Negative.   Neurological: Positive for dizziness (occasional ) and headaches (occasional ).  Hematological: Negative.   Psychiatric/Behavioral: Negative.    Objective:    Physical Exam Vitals and nursing note reviewed.  Constitutional:      Appearance: Normal appearance.  HENT:     Head: Normocephalic and atraumatic.     Nose: Nose normal.     Mouth/Throat:     Mouth: Mucous membranes are moist.     Pharynx: Oropharynx is clear.  Cardiovascular:     Rate and Rhythm: Normal rate and regular rhythm.     Pulses: Normal pulses.     Heart sounds: Normal heart sounds.  Pulmonary:     Effort: Pulmonary effort is normal.     Breath sounds: Normal breath sounds.  Abdominal:     General: Bowel sounds are normal.     Palpations: Abdomen is soft.  Musculoskeletal:        General:  Normal range of motion.     Cervical back: Normal range of motion and neck supple.  Skin:    General: Skin is warm and dry.  Neurological:     General: No focal deficit present.     Mental Status: He is alert and oriented to person, place, and time.  Psychiatric:        Mood and Affect: Mood normal.        Behavior: Behavior normal.        Thought Content: Thought content normal.        Judgment: Judgment normal.     BP (!) 180/98 (BP Location: Right Arm, Patient Position: Sitting, Cuff Size: Large)   Pulse 94   Temp 98.8 F (37.1 C)   Ht 5\' 10"  (1.778 m)   Wt 207 lb 12.8 oz (94.3 kg)   BMI 29.82 kg/m  Wt Readings from Last 3 Encounters:  08/10/20 207 lb 12.8 oz (94.3 kg)  07/10/20 211 lb (95.7 kg)  04/14/20 194 lb 3.2 oz (88.1 kg)     Health Maintenance Due  Topic Date Due  . Hepatitis C Screening  Never done  . COVID-19 Vaccine (1) Never done  . TETANUS/TDAP  Never done  . INFLUENZA VACCINE  05/03/2020    There are no preventive care reminders to display for this patient.  Lab Results  Component Value Date   TSH 1.310 03/01/2018   Lab Results  Component Value Date   WBC 4.2 08/27/2019   HGB 8.9 (L) 08/27/2019   HCT 27.1 (L) 08/27/2019   MCV 91.9 08/27/2019   PLT 220 08/27/2019   Lab Results  Component Value Date   NA 141 03/17/2020   K 4.7 03/17/2020   CO2 21 03/17/2020   GLUCOSE 83 03/17/2020   BUN 38 (H) 03/17/2020   CREATININE 4.24 (H) 03/17/2020   BILITOT 0.3 03/17/2020   ALKPHOS 103 03/17/2020  AST 9 03/17/2020   ALT 6 03/17/2020   PROT 6.6 03/17/2020   ALBUMIN 4.3 03/17/2020   CALCIUM 9.1 03/17/2020   ANIONGAP 12 08/27/2019   Lab Results  Component Value Date   CHOL 91 (L) 03/17/2020   Lab Results  Component Value Date   HDL 36 (L) 03/17/2020   Lab Results  Component Value Date   LDLCALC 41 03/17/2020   Lab Results  Component Value Date   TRIG 62 03/17/2020   Lab Results  Component Value Date   CHOLHDL 2.5 03/17/2020    Lab Results  Component Value Date   HGBA1C 5.0 07/10/2020   HGBA1C 5.0 07/10/2020   HGBA1C 5.0 (A) 07/10/2020   HGBA1C 5.0 07/10/2020    Assessment & Plan:   1. Uncontrolled hypertension Blood pressures are elevated today. Clonidine 0.3 mg given to patient in office and blood pressures remain elevated. We referred him to ED via ambulance at this time. Patient refused and signed AMA form at discharge. He denies severe headaches, confusion, seizures, double vision, and blurred vision, nausea and vomiting. He will report to ED if he experiences these symptoms. Patient verbalized understanding.    2. Hypertension, accelerated - cloNIDine (CATAPRES) tablet 0.3 mg  3. Hypertension, unspecified type He will continue to take medications as prescribed, to decrease high sodium intake, excessive alcohol intake, increase potassium intake, smoking cessation, and increase physical activity of at least 30 minutes of cardio activity daily. He will continue to follow Heart Healthy or DASH diet.  4. Non compliance with medical treatment  5. Observed sleep apnea  6. Insomnia, unspecified type  7. Fatigue, unspecified type  8. Anxiety Stable at discharge.   9. Follow up He will follow up in 1 month.   Meds ordered this encounter  Medications  . cloNIDine (CATAPRES) tablet 0.2 mg  . cloNIDine (CATAPRES) tablet 0.1 mg    No orders of the defined types were placed in this encounter.   Referral Orders  No referral(s) requested today    Kathe Becton,  MSN, FNP-BC [facility] [place], [place] [address] [phone] fax   Problem List Items Addressed This Visit      Cardiovascular and Mediastinum   Hypertension     Respiratory   Observed sleep apnea     Other   Anxiety    Other Visit Diagnoses    Uncontrolled hypertension    -  Primary   Relevant Medications    cloNIDine (CATAPRES) tablet 0.2 mg (Completed)   cloNIDine (CATAPRES) tablet 0.1 mg (Completed)   Hypertension, accelerated       Relevant Medications   cloNIDine (CATAPRES) tablet 0.2 mg (Completed)   cloNIDine (CATAPRES) tablet 0.1 mg (Completed)   Non compliance with medical treatment       Insomnia, unspecified type       Fatigue, unspecified type       Follow up          Meds ordered this encounter  Medications  . cloNIDine (CATAPRES) tablet 0.2 mg  . cloNIDine (CATAPRES) tablet 0.1 mg    Follow-up: No follow-ups on file.    [name], FNP

## 2020-11-03 DIAGNOSIS — N185 Chronic kidney disease, stage 5: Secondary | ICD-10-CM

## 2020-11-03 HISTORY — DX: Chronic kidney disease, stage 5: N18.5

## 2020-11-09 ENCOUNTER — Other Ambulatory Visit: Payer: Self-pay

## 2020-11-09 ENCOUNTER — Encounter (HOSPITAL_COMMUNITY): Payer: Self-pay | Admitting: *Deleted

## 2020-11-09 ENCOUNTER — Inpatient Hospital Stay (HOSPITAL_COMMUNITY): Payer: Medicaid Other

## 2020-11-09 ENCOUNTER — Emergency Department (HOSPITAL_COMMUNITY): Payer: Medicaid Other

## 2020-11-09 ENCOUNTER — Inpatient Hospital Stay (HOSPITAL_COMMUNITY)
Admission: EM | Admit: 2020-11-09 | Discharge: 2020-11-14 | DRG: 291 | Disposition: A | Discharge status: Home / Self Care | Payer: Medicaid Other | Attending: Internal Medicine | Admitting: Internal Medicine

## 2020-11-09 DIAGNOSIS — N184 Chronic kidney disease, stage 4 (severe): Secondary | ICD-10-CM | POA: Diagnosis present

## 2020-11-09 DIAGNOSIS — Z20822 Contact with and (suspected) exposure to covid-19: Secondary | ICD-10-CM | POA: Diagnosis present

## 2020-11-09 DIAGNOSIS — D631 Anemia in chronic kidney disease: Secondary | ICD-10-CM | POA: Diagnosis present

## 2020-11-09 DIAGNOSIS — Z9119 Patient's noncompliance with other medical treatment and regimen: Secondary | ICD-10-CM

## 2020-11-09 DIAGNOSIS — I130 Hypertensive heart and chronic kidney disease with heart failure and stage 1 through stage 4 chronic kidney disease, or unspecified chronic kidney disease: Principal | ICD-10-CM | POA: Diagnosis present

## 2020-11-09 DIAGNOSIS — N186 End stage renal disease: Secondary | ICD-10-CM | POA: Diagnosis not present

## 2020-11-09 DIAGNOSIS — R0603 Acute respiratory distress: Secondary | ICD-10-CM | POA: Diagnosis not present

## 2020-11-09 DIAGNOSIS — E739 Lactose intolerance, unspecified: Secondary | ICD-10-CM | POA: Diagnosis present

## 2020-11-09 DIAGNOSIS — Z0184 Encounter for antibody response examination: Secondary | ICD-10-CM

## 2020-11-09 DIAGNOSIS — I169 Hypertensive crisis, unspecified: Secondary | ICD-10-CM | POA: Diagnosis present

## 2020-11-09 DIAGNOSIS — Z79899 Other long term (current) drug therapy: Secondary | ICD-10-CM

## 2020-11-09 DIAGNOSIS — G4733 Obstructive sleep apnea (adult) (pediatric): Secondary | ICD-10-CM | POA: Diagnosis present

## 2020-11-09 DIAGNOSIS — Z23 Encounter for immunization: Secondary | ICD-10-CM

## 2020-11-09 DIAGNOSIS — M898X9 Other specified disorders of bone, unspecified site: Secondary | ICD-10-CM | POA: Diagnosis present

## 2020-11-09 DIAGNOSIS — I5033 Acute on chronic diastolic (congestive) heart failure: Secondary | ICD-10-CM | POA: Diagnosis present

## 2020-11-09 DIAGNOSIS — Z833 Family history of diabetes mellitus: Secondary | ICD-10-CM

## 2020-11-09 DIAGNOSIS — N179 Acute kidney failure, unspecified: Secondary | ICD-10-CM | POA: Diagnosis present

## 2020-11-09 DIAGNOSIS — I509 Heart failure, unspecified: Secondary | ICD-10-CM

## 2020-11-09 DIAGNOSIS — Z7982 Long term (current) use of aspirin: Secondary | ICD-10-CM

## 2020-11-09 DIAGNOSIS — I161 Hypertensive emergency: Secondary | ICD-10-CM | POA: Diagnosis present

## 2020-11-09 DIAGNOSIS — E7800 Pure hypercholesterolemia, unspecified: Secondary | ICD-10-CM | POA: Diagnosis present

## 2020-11-09 DIAGNOSIS — Z992 Dependence on renal dialysis: Secondary | ICD-10-CM | POA: Diagnosis not present

## 2020-11-09 DIAGNOSIS — F17210 Nicotine dependence, cigarettes, uncomplicated: Secondary | ICD-10-CM | POA: Diagnosis present

## 2020-11-09 DIAGNOSIS — I361 Nonrheumatic tricuspid (valve) insufficiency: Secondary | ICD-10-CM

## 2020-11-09 DIAGNOSIS — E785 Hyperlipidemia, unspecified: Secondary | ICD-10-CM | POA: Diagnosis present

## 2020-11-09 DIAGNOSIS — Z8249 Family history of ischemic heart disease and other diseases of the circulatory system: Secondary | ICD-10-CM | POA: Diagnosis not present

## 2020-11-09 DIAGNOSIS — Z8673 Personal history of transient ischemic attack (TIA), and cerebral infarction without residual deficits: Secondary | ICD-10-CM | POA: Diagnosis not present

## 2020-11-09 DIAGNOSIS — D649 Anemia, unspecified: Secondary | ICD-10-CM

## 2020-11-09 DIAGNOSIS — Z888 Allergy status to other drugs, medicaments and biological substances status: Secondary | ICD-10-CM

## 2020-11-09 DIAGNOSIS — E872 Acidosis: Secondary | ICD-10-CM | POA: Diagnosis not present

## 2020-11-09 DIAGNOSIS — D638 Anemia in other chronic diseases classified elsewhere: Secondary | ICD-10-CM | POA: Diagnosis not present

## 2020-11-09 DIAGNOSIS — I10 Essential (primary) hypertension: Secondary | ICD-10-CM

## 2020-11-09 LAB — BRAIN NATRIURETIC PEPTIDE: B Natriuretic Peptide: 3521.3 pg/mL — ABNORMAL HIGH (ref 0.0–100.0)

## 2020-11-09 LAB — SARS CORONAVIRUS 2 BY RT PCR (HOSPITAL ORDER, PERFORMED IN ~~LOC~~ HOSPITAL LAB): SARS Coronavirus 2: NEGATIVE

## 2020-11-09 LAB — URINALYSIS, ROUTINE W REFLEX MICROSCOPIC
Bilirubin Urine: NEGATIVE
Glucose, UA: NEGATIVE mg/dL
Hgb urine dipstick: NEGATIVE
Ketones, ur: NEGATIVE mg/dL
Leukocytes,Ua: NEGATIVE
Nitrite: NEGATIVE
Protein, ur: 100 mg/dL — AB
Specific Gravity, Urine: 1.006 (ref 1.005–1.030)
pH: 6 (ref 5.0–8.0)

## 2020-11-09 LAB — HEPATIC FUNCTION PANEL
ALT: 25 U/L (ref 0–44)
AST: 30 U/L (ref 15–41)
Albumin: 3.4 g/dL — ABNORMAL LOW (ref 3.5–5.0)
Alkaline Phosphatase: 90 U/L (ref 38–126)
Bilirubin, Direct: 0.2 mg/dL (ref 0.0–0.2)
Indirect Bilirubin: 0.8 mg/dL (ref 0.3–0.9)
Total Bilirubin: 1 mg/dL (ref 0.3–1.2)
Total Protein: 6.8 g/dL (ref 6.5–8.1)

## 2020-11-09 LAB — PREPARE RBC (CROSSMATCH)

## 2020-11-09 LAB — BASIC METABOLIC PANEL
Anion gap: 15 (ref 5–15)
Anion gap: 13 (ref 5–15)
BUN: 61 mg/dL — ABNORMAL HIGH (ref 6–20)
BUN: 59 mg/dL — ABNORMAL HIGH (ref 6–20)
CO2: 16 mmol/L — ABNORMAL LOW (ref 22–32)
CO2: 18 mmol/L — ABNORMAL LOW (ref 22–32)
Calcium: 8.8 mg/dL — ABNORMAL LOW (ref 8.9–10.3)
Calcium: 8.8 mg/dL — ABNORMAL LOW (ref 8.9–10.3)
Chloride: 106 mmol/L (ref 98–111)
Chloride: 106 mmol/L (ref 98–111)
Creatinine, Ser: 6.87 mg/dL — ABNORMAL HIGH (ref 0.61–1.24)
Creatinine, Ser: 7.01 mg/dL — ABNORMAL HIGH (ref 0.61–1.24)
GFR, Estimated: 9 mL/min — ABNORMAL LOW (ref >60)
GFR, Estimated: 9 mL/min — ABNORMAL LOW (ref >60)
Glucose, Bld: 84 mg/dL (ref 70–99)
Glucose, Bld: 119 mg/dL — ABNORMAL HIGH (ref 70–99)
Potassium: 4.4 mmol/L (ref 3.5–5.1)
Potassium: 4.2 mmol/L (ref 3.5–5.1)
Sodium: 137 mmol/L (ref 135–145)
Sodium: 137 mmol/L (ref 135–145)

## 2020-11-09 LAB — CBC
HCT: 22.1 % — ABNORMAL LOW (ref 39.0–52.0)
HCT: 27.5 % — ABNORMAL LOW (ref 39.0–52.0)
Hemoglobin: 6.9 g/dL — CL (ref 13.0–17.0)
Hemoglobin: 8.6 g/dL — ABNORMAL LOW (ref 13.0–17.0)
MCH: 27.9 pg (ref 26.0–34.0)
MCH: 27.2 pg (ref 26.0–34.0)
MCHC: 31.2 g/dL (ref 30.0–36.0)
MCHC: 31.3 g/dL (ref 30.0–36.0)
MCV: 89.5 fL (ref 80.0–100.0)
MCV: 87 fL (ref 80.0–100.0)
Platelets: 264 10*3/uL (ref 150–400)
Platelets: 288 10*3/uL (ref 150–400)
RBC: 2.47 MIL/uL — ABNORMAL LOW (ref 4.22–5.81)
RBC: 3.16 MIL/uL — ABNORMAL LOW (ref 4.22–5.81)
RDW: 21.2 % — ABNORMAL HIGH (ref 11.5–15.5)
RDW: 19.9 % — ABNORMAL HIGH (ref 11.5–15.5)
WBC: 4.3 10*3/uL (ref 4.0–10.5)
WBC: 4.9 10*3/uL (ref 4.0–10.5)
nRBC: 0.5 % — ABNORMAL HIGH (ref 0.0–0.2)
nRBC: 0 % (ref 0.0–0.2)

## 2020-11-09 LAB — RAPID URINE DRUG SCREEN, HOSP PERFORMED
Amphetamines: NOT DETECTED
Barbiturates: NOT DETECTED
Benzodiazepines: NOT DETECTED
Cocaine: NOT DETECTED
Opiates: NOT DETECTED
Tetrahydrocannabinol: NOT DETECTED

## 2020-11-09 LAB — TROPONIN I (HIGH SENSITIVITY)
Troponin I (High Sensitivity): 40 ng/L — ABNORMAL HIGH (ref <18)
Troponin I (High Sensitivity): 42 ng/L — ABNORMAL HIGH (ref <18)

## 2020-11-09 LAB — SAR COV2 SEROLOGY (COVID19)AB(IGG),IA: SARS-CoV-2 Ab, IgG: REACTIVE — AB

## 2020-11-09 LAB — ECHOCARDIOGRAM COMPLETE
Area-P 1/2: 4.06 cm2
Height: 71 in
Weight: 3200 oz

## 2020-11-09 LAB — CREATININE, SERUM
Creatinine, Ser: 7.05 mg/dL — ABNORMAL HIGH (ref 0.61–1.24)
GFR, Estimated: 9 mL/min — ABNORMAL LOW (ref >60)

## 2020-11-09 LAB — MAGNESIUM: Magnesium: 1.6 mg/dL — ABNORMAL LOW (ref 1.7–2.4)

## 2020-11-09 LAB — MRSA PCR SCREENING: MRSA by PCR: NEGATIVE

## 2020-11-09 LAB — HIV ANTIBODY (ROUTINE TESTING W REFLEX): HIV Screen 4th Generation wRfx: NONREACTIVE

## 2020-11-09 MED ORDER — SODIUM CHLORIDE 0.9 % IV SOLN: INTRAVENOUS | Status: DC

## 2020-11-09 MED ORDER — DOXAZOSIN MESYLATE 2 MG PO TABS
16.0000 mg | ORAL_TABLET | Freq: Every day | ORAL | Status: DC
  Administered 2020-11-09 – 2020-11-14 (×6): 16 mg via ORAL
  Filled 2020-11-09 (×3): qty 8
  Filled 2020-11-09 (×3): qty 2

## 2020-11-09 MED ORDER — AMLODIPINE BESYLATE 10 MG PO TABS
10.0000 mg | ORAL_TABLET | Freq: Every day | ORAL | Status: DC
  Administered 2020-11-09 – 2020-11-14 (×6): 10 mg via ORAL
  Filled 2020-11-09 (×3): qty 1
  Filled 2020-11-09: qty 2
  Filled 2020-11-09 (×2): qty 1

## 2020-11-09 MED ORDER — SODIUM CHLORIDE 0.9 % IV SOLN: 10.0000 mL/h | Freq: Once | INTRAVENOUS | Status: DC

## 2020-11-09 MED ORDER — FUROSEMIDE 10 MG/ML IJ SOLN
80.0000 mg | Freq: Every day | INTRAMUSCULAR | Status: DC
  Administered 2020-11-09 – 2020-11-10 (×2): 80 mg via INTRAVENOUS
  Filled 2020-11-09 (×2): qty 8

## 2020-11-09 MED ORDER — ATORVASTATIN CALCIUM 40 MG PO TABS
40.0000 mg | ORAL_TABLET | Freq: Every day | ORAL | Status: DC
  Administered 2020-11-09 – 2020-11-14 (×6): 40 mg via ORAL
  Filled 2020-11-09: qty 1
  Filled 2020-11-09: qty 4
  Filled 2020-11-09 (×3): qty 1

## 2020-11-09 MED ORDER — INFLUENZA VAC SPLIT QUAD 0.5 ML IM SUSY
0.5000 mL | PREFILLED_SYRINGE | INTRAMUSCULAR | Status: AC
  Administered 2020-11-11: 0.5 mL via INTRAMUSCULAR
  Filled 2020-11-09: qty 0.5

## 2020-11-09 MED ORDER — HEPARIN SODIUM (PORCINE) 5000 UNIT/ML IJ SOLN
5000.0000 [IU] | Freq: Three times a day (TID) | INTRAMUSCULAR | Status: DC
  Administered 2020-11-09 – 2020-11-14 (×15): 5000 [IU] via SUBCUTANEOUS
  Filled 2020-11-09 (×13): qty 1

## 2020-11-09 MED ORDER — NICARDIPINE HCL IN NACL 20-0.86 MG/200ML-% IV SOLN
3.0000 mg/h | INTRAVENOUS | Status: DC
  Administered 2020-11-09: 5 mg/h via INTRAVENOUS
  Filled 2020-11-09: qty 200

## 2020-11-09 MED ORDER — FUROSEMIDE 10 MG/ML IJ SOLN
40.0000 mg | INTRAMUSCULAR | Status: AC
  Administered 2020-11-09: 40 mg via INTRAVENOUS
  Filled 2020-11-09: qty 4

## 2020-11-09 MED ORDER — MAGNESIUM SULFATE IN D5W 1-5 GM/100ML-% IV SOLN
1.0000 g | Freq: Once | INTRAVENOUS | Status: AC
  Administered 2020-11-09: 1 g via INTRAVENOUS
  Filled 2020-11-09: qty 100

## 2020-11-09 MED ORDER — NICARDIPINE HCL IN NACL 20-0.86 MG/200ML-% IV SOLN
3.0000 mg/h | INTRAVENOUS | Status: DC
  Administered 2020-11-09: 8 mg/h via INTRAVENOUS
  Administered 2020-11-09 – 2020-11-10 (×3): 3 mg/h via INTRAVENOUS
  Filled 2020-11-09 (×3): qty 200

## 2020-11-09 MED ORDER — LABETALOL HCL 200 MG PO TABS: 600.0000 mg | ORAL_TABLET | Freq: Once | ORAL | Status: DC

## 2020-11-09 MED ORDER — HYDRALAZINE HCL 50 MG PO TABS
100.0000 mg | ORAL_TABLET | Freq: Once | ORAL | Status: AC
  Administered 2020-11-09: 100 mg via ORAL
  Filled 2020-11-09: qty 2

## 2020-11-09 MED ORDER — LABETALOL HCL 300 MG PO TABS
600.0000 mg | ORAL_TABLET | Freq: Three times a day (TID) | ORAL | Status: DC
  Administered 2020-11-09 – 2020-11-14 (×14): 600 mg via ORAL
  Filled 2020-11-09: qty 2
  Filled 2020-11-09 (×3): qty 6
  Filled 2020-11-09 (×3): qty 2
  Filled 2020-11-09 (×2): qty 6
  Filled 2020-11-09 (×3): qty 2
  Filled 2020-11-09: qty 6
  Filled 2020-11-09 (×2): qty 2

## 2020-11-09 MED ORDER — HYDRALAZINE HCL 50 MG PO TABS
100.0000 mg | ORAL_TABLET | Freq: Three times a day (TID) | ORAL | Status: DC
  Administered 2020-11-09 – 2020-11-10 (×5): 100 mg via ORAL
  Filled 2020-11-09 (×5): qty 2

## 2020-11-09 MED ORDER — ASPIRIN EC 81 MG PO TBEC
81.0000 mg | DELAYED_RELEASE_TABLET | Freq: Every day | ORAL | Status: DC
  Administered 2020-11-09 – 2020-11-14 (×6): 81 mg via ORAL
  Filled 2020-11-09 (×6): qty 1

## 2020-11-09 MED ORDER — AMLODIPINE BESYLATE 5 MG PO TABS
10.0000 mg | ORAL_TABLET | Freq: Once | ORAL | Status: AC
  Administered 2020-11-09: 10 mg via ORAL
  Filled 2020-11-09: qty 2

## 2020-11-09 MED ORDER — CHLORHEXIDINE GLUCONATE CLOTH 2 % EX PADS
6.0000 | MEDICATED_PAD | Freq: Every day | CUTANEOUS | Status: DC
  Administered 2020-11-09 – 2020-11-10 (×2): 6 via TOPICAL

## 2020-11-09 NOTE — Consult Note (Signed)
Nephrology Consult   Requesting provider: Larey Days, MD  Service requesting consult: ICU Reason for consult: AKI on CKD stage IV   Assessment/Recommendations: Stanley [name] is a/an 45 y.o. male with a past medical history CKD stage IV, HTN, chronic diastolic CHF, severe LVH who present w/ poorly controlled hypertension.  AKI on CKD stage IV Patient with past medical history of CKD stage IV with a baseline creatinine between 4 and 5.  Patient reports that he follows with [facility] in the past but is 3-4 years ago and he has not followed because he was always working and kept missing appointments.  On arrival patient's creatinine elevated at 6.87.  Urinalysis showed proteinuria with a protein of 100.  Hemoglobin negative, glucose negative, ketones.  Differential includes cardiorenal syndrome 2/2 CHF exacerbation versus progression of CKD. -Lasix 40 mg IV twice daily -Continue to monitor daily Cr, Dose meds for GFR -Strict I's and O's -Daily weights -Maintain MAP>65 for optimal renal perfusion.  -Avoid nephrotoxic medications including NSAIDs and Vanc/Zosyn combo -Renal ultrasound to rule out obstruction -Currently no indication for HD but may need if kidney function continues to decline -Nephrology will continue to follow  HTN Presented in hypertensive emergency with blood pressure of 242/126.  He was started on nicardipine drip as well as IV Lasix and supplemental oxygen.  Patient started on hydralazine 100 mg 3 times daily, labetalol 600 mg 3 times daily, amlodipine 10 mg daily, doxazosin 16 mg daily. -Continue medication management per primary team  Anemia 2/2 CKD On arrival hemoglobin of 6.9.  No obvious signs of bleeding.  Patient has required transfusions in past admissions.  T transfusion perprimaryteam. -Follow-up on posttransfusion H&H -Morning CBCs -Transfuse for Hgb<7 g/dL -No role for ESA in this setting  CHF exacerbation 2/2 chronic diastolic CHF Most recent  echo on file 08/2019.  LVEF of 65-70% with hyperdynamic left ventricular function.  Elevated left atrial pressure.  Grade 2 diastolic dysfunction.  BNP elevated on arrival at 3521.  Chest x-ray showed bilateral pleural effusions.  Patient was given 40 mg IV Lasix while still in the emergency department.  Patient reports "a lot" of urine output although 300 mL of urine reported. -Strict I's and O's -See above for medication recommendations -Continue management per primary team  Recommendations conveyed to primary service.    [facility] 11/09/2020 1:38 PM   _____________________________________________________________________________________ CC: Lower extremity swelling and shortness of breath  History of Present Illness: Stanley [name] is a/an 45 y.o. male with a past medical history of CKD stage IV, HLD, HTN, chronic diastolic CHF who presents with lower extremity edema and orthopnea.  Patient was also having difficulty controlling his blood pressures with initial emergency department blood pressure of 242/126.  Patient reports that he takes a "fluid pill" daily but that he feels like he does not respond and does not have much urine output.  He also reports that he takes his blood pressure medications as prescribed every day.  He cannot name off his medications but reports that he takes them in the morning around 6 AM, some of his medications around lunchtime, some at dinnertime, and then some right before bed.  Reports that he has had increased leg swelling over the past week but just noticed the shortness of breath upon laying down night before last.   Medications:  Current Facility-Administered Medications  Medication Dose Route Frequency Provider Last Rate Last Admin  . 0.9 %  sodium chloride infusion  10 mL/hr Intravenous Once Erick Colace, NP      . amLODipine (NORVASC) tablet 10 mg  10 mg Oral Daily Erick Colace, NP      . aspirin EC tablet 81 mg   81 mg Oral Daily Erick Colace, NP      . atorvastatin (LIPITOR) tablet 40 mg  40 mg Oral Daily Erick Colace, NP      . doxazosin (CARDURA) tablet 16 mg  16 mg Oral Daily Erick Colace, NP      . heparin injection 5,000 Units  5,000 Units Subcutaneous Q8H Erick Colace, NP      . hydrALAZINE (APRESOLINE) tablet 100 mg  100 mg Oral TID Erick Colace, NP      . labetalol (NORMODYNE) tablet 600 mg  600 mg Oral TID Erick Colace, NP      . nicardipine (CARDENE) '20mg'$  in 0.86% saline 262m IV infusion (0.1 mg/ml)  3-15 mg/hr Intravenous Continuous BErick Colace NP       Current Outpatient Medications  Medication Sig Dispense Refill  . amLODipine (NORVASC) 10 MG tablet Take 1 tablet (10 mg total) by mouth daily. 90 tablet 3  . aspirin EC 81 MG tablet Take 81 mg by mouth daily.    .Marland Kitchenatorvastatin (LIPITOR) 40 MG tablet Take 1 tablet (40 mg total) by mouth daily. Please keep upcoming appt in June with Dr. SMarlou Porchbefore anymore refills. Thank you 30 tablet 11  . doxazosin (CARDURA) 8 MG tablet Take 2 tablets (16 mg total) by mouth daily. [id number] 3  . folic acid (FOLVITE) [id number] tablet Take 0.5 tablets (400 mcg total) by mouth daily. 30 tablet 11  . furosemide (LASIX) 20 MG tablet TAKE 1 TABLET BY MOUTH ONCE DAILY AS NEEDED (Patient taking differently: Take 20 mg by mouth daily.) 90 tablet 0  . hydrALAZINE (APRESOLINE) 100 MG tablet Take 1 tablet (100 mg total) by mouth 3 (three) times daily. 270 tablet 3  . labetalol (NORMODYNE) 200 MG tablet Take 3 tablets (600 mg total) by mouth 3 (three) times daily. 810 tablet 3  . Multiple Vitamin (MULTIVITAMIN WITH MINERALS) TABS tablet Take 1 tablet by mouth daily.    . traZODone (DESYREL) 100 MG tablet Take 100 mg by mouth at bedtime as needed for sleep.    .Marland Kitchenacetaminophen (TYLENOL) 500 MG tablet Take 1,000 mg by mouth as needed for mild pain.    .Marland Kitchenalbuterol (PROAIR HFA) 108 (90 Base) MCG/ACT inhaler Inhale 1-2 puffs into the lungs every  6 (six) hours as needed for wheezing or shortness of breath. 18 g 11  . ibuprofen (ADVIL) 800 MG tablet Take 1 tablet (800 mg total) by mouth every 8 (eight) hours as needed. 30 tablet 3  . ondansetron (ZOFRAN ODT) 4 MG disintegrating tablet Take 1 tablet (4 mg total) by mouth every 8 (eight) hours as needed for nausea or vomiting. 30 tablet 3  . PSYLLIUM PO Take 1 tablet by mouth daily.       ALLERGIES Lactose intolerance (gi), Nitroglycerin, Other, and Isosorbide  MEDICAL HISTORY Past Medical History:  Diagnosis Date  . Anxiety   . CKD (chronic kidney disease), stage IV ([place]   . Depression   . Dyslexia   . Heart palpitations   . High cholesterol   . Hypertension   . Hypertensive urgency   . Polysubstance (including opioids) dependence, binge pattern ([place] 08/[place]  . [place] ([place]   "minor;  completely recovered today" (06/13/2017)  . Tobacco abuse      SOCIAL HISTORY Social History   Socioeconomic History  . Marital status: Married    Spouse name: Not on file  . Number of children: Not on file  . Years of education: Not on file  . Highest education level: Not on file  Occupational History  . Not on file  Tobacco Use  . Smoking status: Current Every Day Smoker    Packs/day: 1.00    Years: 30.00    Pack years: 30.00    Types: Cigarettes  . Smokeless tobacco: Never Used  Vaping Use  . Vaping Use: Never used  Substance and Sexual Activity  . Alcohol use: No  . Drug use: Yes    Types: Marijuana    Comment: 06/13/2017 "not often"  . Sexual activity: Not Currently  Other Topics Concern  . Not on file  Social History Narrative  . Not on file   Social Determinants of Health   Financial Resource Strain: Not on file  Food Insecurity: Not on file  Transportation Needs: Not on file  Physical Activity: Not on file  Stress: Not on file  Social Connections: Not on file  Intimate Partner Violence: Not on file     FAMILY HISTORY Family History  Problem  Relation Age of Onset  . Hypertension Mother   . Hypertension Brother   . Diabetes type II Brother       Review of Systems: 12 systems reviewed Otherwise as per HPI, all other systems reviewed and negative  Physical Exam: Vitals:   11/09/20 1115 11/09/20 1200  BP: (!) 180/95 (!) 158/84  Pulse: 92 94  Resp: (!) 27 20  Temp:    SpO2: 91% 94%   Total I/O In: 300 [Blood:300] Out: 300 [Urine:300]  Intake/Output Summary (Last 24 hours) at 11/09/2020 1338 Last data filed at 11/09/2020 1057 Gross per 24 hour  Intake 300 ml  Output 300 ml  Net 0 ml   General: Lying in bed, appearing uncomfortable HEENT: anicteric sclera, oropharynx clear without lesions CV: Regular rate on exam, regular rhythm.  Pitting edema to his knees bilaterally. Lungs: Mild crackles in lung bases bilaterally with normal work of breathing Abd: soft, non-tender, non-distended Skin: no visible lesions or rashes Psych: alert, engaged, appropriate mood and affect Musculoskeletal: no obvious deformities Neuro: normal speech, no gross focal deficits   Test Results Reviewed Lab Results  Component Value Date   NA 137 11/09/2020   K 4.4 11/09/2020   CL 106 11/09/2020   CO2 16 (L) 11/09/2020   BUN 61 (H) 11/09/2020   CREATININE 6.87 (H) 11/09/2020   CALCIUM 8.8 (L) 11/09/2020   ALBUMIN 4.3 03/17/2020   PHOS 5.6 (H) 08/27/2019     I have reviewed all relevant outside healthcare records related to the patient's current hospitalization

## 2020-11-09 NOTE — ED Provider Notes (Addendum)
[facility] EMERGENCY DEPARTMENT Provider Note   CSN: [id number] Arrival date & time: 11/09/20  [id number]     History Chief Complaint  Patient presents with  . Hypertension  . Chest Pain    Stanley [name] is a 45 y.o. male.  45 year old male with extensive past medical history below including CKD, CVA, polysubstance abuse, hypertension, anxiety/depression, hyperlipidemia who presents with hypertension.  Patient states that for the past several days, he has had an uneasy feeling when he tries to to go to sleep at night.  He states that he feels a pressure/uneasy feeling in his chest, feels short of breath when he tries to lay flat, and feels a need to get out of bed and walk around.  Symptoms usually improve when he gets up and moves around.  He reports recent increased lower extremity edema and PND.  He denies weight gain.  He states that he has been compliant with all of his medications, last dose was last night.  He has had a cough recently that has been improving.  He reports that several weeks ago his mom was hospitalized for COVID-19 and his entire family was symptomatic.  He is not sure whether he had it but states that his current cough is better than it originally was.  He denies headache, vision changes, or vomiting.  The history is provided by the patient.  Hypertension Associated symptoms include chest pain.  Chest Pain      Past Medical History:  Diagnosis Date  . Anxiety   . CKD (chronic kidney disease), stage IV ([place])   . Depression   . Dyslexia   . Heart palpitations   . High cholesterol   . Hypertension   . Hypertensive urgency   . Polysubstance (including opioids) dependence, binge pattern ([place]) 08/2020  . Stroke ([place]) 06/03/2017   "minor; completely recovered today" (06/13/2017)  . Tobacco abuse     Patient Active Problem List   Diagnosis Date Noted  . Observed sleep apnea 12/11/2019  . Pain in both testicles 11/19/2019  . Hypertensive crisis  08/23/2019  . Acute posterior epistaxis 08/23/2019  . Heart palpitations 02/23/2019  . Anxiety 12/19/2018  . Acute pain of right knee 12/19/2018  . Inflammation of joint of right knee 12/19/2018  . Swollen testicle 12/19/2018  . Syncope and collapse   . Transient vision disturbance of left eye   . Hydrocele   . Hypertensive urgency 06/12/2017  . CKD stage 4 secondary to hypertension ([place]) 03/01/2017  . Anemia, chronic disease 03/01/2017  . Hyperparathyroidism, secondary renal ([place]) 03/01/2017  . Hypertensive emergency 09/26/2016  . Tobacco abuse 09/26/2016  . Hypertension 09/26/2016  . AKI (acute kidney injury) ([place]) 09/26/2016    Past Surgical History:  Procedure Laterality Date  . NO PAST SURGERIES         Family History  Problem Relation Age of Onset  . Hypertension Mother   . Hypertension Brother   . Diabetes type II Brother     Social History   Tobacco Use  . Smoking status: Current Every Day Smoker    Packs/day: 1.00    Years: 30.00    Pack years: 30.00    Types: Cigarettes  . Smokeless tobacco: Never Used  Vaping Use  . Vaping Use: Never used  Substance Use Topics  . Alcohol use: No  . Drug use: Yes    Types: Marijuana    Comment: 06/13/2017 "not often"    Home Medications Prior to Admission  medications   Medication Sig Start Date End Date Taking? Authorizing Provider  acetaminophen (TYLENOL) 500 MG tablet Take 1,000 mg by mouth as needed for mild pain.    [provider]  albuterol (PROAIR HFA) 108 (90 Base) MCG/ACT inhaler Inhale 1-2 puffs into the lungs every 6 (six) hours as needed for wheezing or shortness of breath. 03/18/20   [name], FNP  amLODipine (NORVASC) 10 MG tablet Take 1 tablet (10 mg total) by mouth daily. 03/17/20 08/10/21  Jerline Pain, MD  aspirin EC 81 MG tablet Take 81 mg by mouth daily.    [provider]  atorvastatin (LIPITOR) 40 MG tablet Take 1 tablet (40 mg total) by mouth daily. Please keep  upcoming appt in June with Dr. Marlou Porch before anymore refills. Thank you 03/10/20   [name], FNP  doxazosin (CARDURA) 8 MG tablet Take 2 tablets (16 mg total) by mouth daily. 03/17/20   Jerline Pain, MD  folic acid (FOLVITE) [id number] MCG tablet Take 0.5 tablets (400 mcg total) by mouth daily. 03/10/20   [name], FNP  furosemide (LASIX) 20 MG tablet TAKE 1 TABLET BY MOUTH ONCE DAILY AS NEEDED 07/31/20   [name], FNP  hydrALAZINE (APRESOLINE) 100 MG tablet Take 1 tablet (100 mg total) by mouth 3 (three) times daily. 03/17/20   Jerline Pain, MD  ibuprofen (ADVIL) 800 MG tablet Take 1 tablet (800 mg total) by mouth every 8 (eight) hours as needed. 03/10/20   [name], FNP  labetalol (NORMODYNE) 200 MG tablet Take 3 tablets (600 mg total) by mouth 3 (three) times daily. 03/17/20   Jerline Pain, MD  Multiple Vitamin (MULTIVITAMIN WITH MINERALS) TABS tablet Take 1 tablet by mouth daily as needed.    [provider]  ondansetron (ZOFRAN ODT) 4 MG disintegrating tablet Take 1 tablet (4 mg total) by mouth every 8 (eight) hours as needed for nausea or vomiting. 03/10/20   [name], FNP  PSYLLIUM PO Take 1 tablet by mouth daily.    [provider]  traZODone (DESYREL) 150 MG tablet Take 1 tablet (150 mg total) by mouth at bedtime. 04/14/20   [name], FNP    Allergies    Lactose intolerance (gi), Nitroglycerin, Other, and Isosorbide  Review of Systems   Review of Systems  Cardiovascular: Positive for chest pain.   All other systems reviewed and are negative except that which was mentioned in HPI  Physical Exam Updated Vital Signs BP (!) 248/126   Pulse 88   Temp 98.8 F (37.1 C) (Oral)   Resp 20   Ht '5\' 11"'$  (1.803 m)   Wt 90.7 kg   SpO2 93%   BMI 27.89 kg/m   Physical Exam Vitals and nursing note reviewed.  Constitutional:      General: He is not in acute distress.    Appearance: Normal appearance.  HENT:     Head:  Normocephalic and atraumatic.  Eyes:     Conjunctiva/sclera: Conjunctivae normal.  Cardiovascular:     Rate and Rhythm: Normal rate and regular rhythm.     Heart sounds: Normal heart sounds.  Pulmonary:     Effort: Pulmonary effort is normal.     Breath sounds: Rales present.  Abdominal:     General: Abdomen is flat. Bowel sounds are normal. There is no distension.     Palpations: Abdomen is soft.     Tenderness: There is no abdominal tenderness.  Musculoskeletal:     Right lower leg: Edema present.     Left lower leg: Edema present.  Skin:    General: Skin is warm and dry.  Neurological:     Mental Status: He is alert and oriented to person, place, and time.     Comments: fluent  Psychiatric:        Mood and Affect: Mood normal.        Behavior: Behavior normal.     ED Results / Procedures / Treatments   Labs (all labs ordered are listed, but only abnormal results are displayed) Labs Reviewed  BASIC METABOLIC PANEL - Abnormal; Notable for the following components:      Result Value   CO2 16 (*)    BUN 61 (*)    Creatinine, Ser 6.87 (*)    Calcium 8.8 (*)    GFR, Estimated 9 (*)    All other components within normal limits  CBC - Abnormal; Notable for the following components:   RBC 2.47 (*)    Hemoglobin 6.9 (*)    HCT 22.1 (*)    RDW 21.2 (*)    nRBC 0.5 (*)    All other components within normal limits  BRAIN NATRIURETIC PEPTIDE - Abnormal; Notable for the following components:   B Natriuretic Peptide 3,521.3 (*)    All other components within normal limits  TROPONIN I (HIGH SENSITIVITY) - Abnormal; Notable for the following components:   Troponin I (High Sensitivity) 40 (*)    All other components within normal limits  TROPONIN I (HIGH SENSITIVITY) - Abnormal; Notable for the following components:   Troponin I (High Sensitivity) 42 (*)    All other components within normal limits  SARS CORONAVIRUS 2 BY RT PCR (HOSPITAL ORDER, [facility])  HEPATIC FUNCTION PANEL  TYPE AND SCREEN  PREPARE RBC (CROSSMATCH)    EKG EKG Interpretation  Date/Time:  Monday November 09 2020 03:22:30 EST Ventricular Rate:  73 PR Interval:  166 QRS Duration: 88 QT Interval:  462 QTC Calculation: 508 R Axis:   26 Text Interpretation: Normal sinus rhythm Abnormal QRS-T angle, consider primary T wave abnormality Prolonged QT Abnormal ECG When compared with ECG of 08/23/2019, ST-t abnormality has improved Confirmed by Delora Fuel ([id number]) on 11/09/2020 3:55:29 AM   Radiology DG Chest 2 View  Result Date: 11/09/2020 CLINICAL DATA:  Hypertension EXAM: CHEST - 2 VIEW COMPARISON:  02/10/2018 FINDINGS: Cardiomegaly. Interstitial prominence in the lower lobes. Suspect small bilateral effusions. No acute bony abnormality or pneumothorax. IMPRESSION: Interstitial prominence most notable in the lower lobes bilaterally with small effusions. Cannot exclude infection or edema. Electronically Signed   By: Rolm Baptise M.D.   On: 11/09/2020 03:47    Procedures .Critical Care Performed by: Sharlett Iles, MD Authorized by: Sharlett Iles, MD   Critical care provider statement:    Critical care time (minutes):  30   Critical care time was exclusive of:  Separately billable procedures and treating other patients   Critical care was necessary to treat or prevent imminent or life-threatening deterioration of the following conditions:  Renal failure and cardiac failure   Critical care was time spent personally by me on the following activities:  Development of treatment plan with patient or surrogate, discussions with consultants, evaluation of patient's response to treatment, examination of patient, obtaining history from patient or surrogate, ordering and performing treatments and interventions, ordering and review of laboratory studies, ordering and review of radiographic  studies, re-evaluation of patient's condition and review of old charts      Medications Ordered in ED Medications  nicardipine (CARDENE) '20mg'$  in 0.86% saline 233m IV infusion (0.1 mg/ml) (5 mg/hr Intravenous New Bag/Given 11/09/20 1008)  0.9 %  sodium chloride infusion (has no administration in time range)  labetalol (NORMODYNE) tablet 600 mg (has no administration in time range)  amLODipine (NORVASC) tablet 10 mg (10 mg Oral Given 11/09/20 0957)  furosemide (LASIX) injection 40 mg (40 mg Intravenous Given 11/09/20 1009)  hydrALAZINE (APRESOLINE) tablet 100 mg (100 mg Oral Given 11/09/20 0957)    Ancelmo S Geister was evaluated in Emergency Department on 11/09/2020 for the symptoms described in the history of present illness. He was evaluated in the context of the global COVID-19 pandemic, which necessitated consideration that the patient might be at risk for infection with the SARS-CoV-2 virus that causes COVID-19. Institutional protocols and algorithms that pertain to the evaluation of patients at risk for COVID-19 are in a state of rapid change based on information released by regulatory bodies including the CDC and federal and state organizations. These policies and algorithms were followed during the patient's care in the ED.  ED Course  I have reviewed the triage vital signs and the nursing notes.  Pertinent labs & imaging results that were available during my care of the patient were reviewed by me and considered in my medical decision making (see chart for details).    MDM Rules/Calculators/A&P                          PT alert, mentating normally on exam. Severely hypertensive. CXR w/ volume overload, EKG without acute ischemic changes. Labs show worsening CKD w/ Cr 6.87, BUN 61, worsening anemia Hgb 6.9, trop 42 similar to previous, BNP 3521 elevated from previous. COVID negative. Ordered 1u pRBC, I suspect he may have anemia of chronic disease due to renal failure. No hx GI bleeding. I am concerned about hypertensive emergency w/ CHF and acute renal failure. Initiated  nicardipine drip and gave morning HTN meds as well as IV lasix. Discussed admission w/ CCM, GShirlee Limerick and they will admit for further treatment.  Final Clinical Impression(s) / ED Diagnoses Final diagnoses:  Hypertensive emergency  Acute renal failure, unspecified acute renal failure type (HCC)  Anemia, unspecified type  Acute congestive heart failure, unspecified heart failure type ([facility]    Rx / DC Orders ED Discharge Orders    None       Narek Kniss, RWenda Overland MD 11/09/20 1334    Venecia Mehl, RWenda Overland MD 11/09/20 1335

## 2020-11-09 NOTE — Progress Notes (Signed)
  Echocardiogram 2D Echocardiogram has been performed.  Stanley [name] 11/09/2020, 3:40 PM

## 2020-11-09 NOTE — Progress Notes (Signed)
[facility] Progress Note Patient Name: Stanley [name] DOB: 1976-01-02 MRN: [id number]   Date of Service  11/09/2020  HPI/Events of Note  Day shift nurse calls to report that the patient has c/o blurred visions for the last several days. I suspect that this is related to his hypertensive emergency that he was admitted with vs noncompliance with HD (BUN = 61 on admission). He has a history of ESRD on HD and has been dismissed from the renal practice d/t not showing up for his HD treatments.   eICU Interventions  Plan: 1. Head CT Scan w/o contrast now.  2. Will request that the ground team do a formal neurologic exam.      Intervention Category Major Interventions: Change in mental status - evaluation and management  [name] 11/09/2020, 7:28 PM

## 2020-11-09 NOTE — Progress Notes (Signed)
Paged Elink to make aware of Left eye blurry vision; no double vision; pt states this is new with current situation.  Report given to [employer].

## 2020-11-09 NOTE — ED Notes (Signed)
Patient came up to RN stating he was going to leave, strongly encouraged patient not to leave his blood pressure is high and his blood count is very low.

## 2020-11-09 NOTE — ED Triage Notes (Addendum)
Presents to ED via [facility] from home states he has na uneasy feeling in his chest for several days, states he can't lay down to sleep. C/o swelling to both lower ext. Patient is very hypertensive , states he takes his medication everyday , however still is always hypertensive.

## 2020-11-09 NOTE — H&P (Addendum)
NAME:  Stanley [name], MRN:  [id number], DOB:  November 13, 1975, LOS: 0 ADMISSION DATE:  11/09/2020, CONSULTATION DATE:  2/7 REFERRING MD:  Dr Rex Kras, CHIEF COMPLAINT:  Hypertensive emergency    Brief History:  45 year old male patient with history as mentioned below presented 2/7 with chief complaint of chest pressure, lower extremity edema, and orthopnea. Initial blood pressure 242/126 critical care admitted for hypertensive emergency on nicardipine drip  History of Present Illness:  This is a 45 year old male With history of very difficult to control hypertension presented to the emergency room on 2/7 with several day history of feeling "uneasy in his chest" orthopnea, and lower extremity swelling. In the emergency room initial recorded blood pressure 242/126. Additional diagnostics in the emergency room included: Creatinine of 6.87 up from baseline in the 4.2-5 range, 9 anion gap metabolic acidosis, mildly elevated troponins of 40, BNP of 3521, and portable chest x-ray for room mild interstitial prominence worrisome for possible evolving pulmonary edema. He states she takes his medications as ordered. Did have possible Covid exposure about 3 weeks prior. Therapeutic interventions to date have included: Initiation of nicardipine drip, IV Lasix, and supplemental oxygen. Because of acute hypertensive emergency critical care was asked to admit  Past Medical History:  HTN, poorly controlled, with prior admission for hypertensive crisis, prior episodes of leaving AMA, medical noncompliance. CKD stage IV Obstructive sleep apnea Prior epistaxis Syncope Anemia Tobacco abuse  Significant Hospital Events:  2/7 admitted nicardipine drip started  Consults:    Procedures:    Significant Diagnostic Tests:   Echocardiogram 2/7 Micro Data:  SARS coronavirus 2: Antimicrobials:  na   Interim History / Subjective:  Feels better   Objective   Blood pressure (Abnormal) 180/95, pulse 92, temperature  97.9 F (36.6 C), temperature source Oral, resp. rate (Abnormal) 27, height '5\' 11"'$  (1.803 m), weight 90.7 kg, SpO2 91 %.        Intake/Output Summary (Last 24 hours) at 11/09/2020 1138 Last data filed at 11/09/2020 1057 Gross per 24 hour  Intake 300 ml  Output 300 ml  Net 0 ml   Filed Weights   11/09/20 0329  Weight: 90.7 kg    Examination: General: otherwise healthy appearing 45 year old male. Lying in bed. Not in acute distress.  HENT: NCAT no JVD MMM Lungs: fine crackles in bases no accessory use currently on room air Cardiovascular: RRR Abdomen: soft not tender  Extremities: warm dry + LE edema  Neuro: awake and oriented  GU: clear yellow   Resolved Hospital Problem list     Assessment & Plan:  Hypertensive crisis/emergency -History of medical noncompliance but reports he has been taking medications as prescribed, has had very difficult to control hypertension in the past -initial BP 242/126  at 0320 am.  Plan Shoot for SBP 140-160 and DBP < 100 for next 24 hrs then aim for SBP <140-->titrate nicardipine to achieve this added back his home meds Admit to ICU Echo Tele Cycle CEs  Acute Pulmonary edema 2/2 above Plan Got lasix Wean oxygen Pulse ox Repeat am cxr  Acute on chronic renal failure  ckd stage IV at base line w/ cr typically in the 4.3 to 5 range Plan Control BP Renal adjust meds Need to get him re-established w/ nephro. Will request in-pt consult. Apparently he was discharged from the practice for missing appointments. He said early on he would oversleep and miss them   Anemia. Suspect 2/2 his renal fxn. No evidence of bleeding. Has required  transfusion in the past.  Plan Transfuse 1 unit Am cbc Ok to use [place] heparin still as doubt actively bleeding    Best practice (evaluated daily)  Diet: low salt Pain/Anxiety/Delirium protocol (if indicated): NA VAP protocol (if indicated): NA DVT prophylaxis: [place] heparin  GI prophylaxis: NA Glucose  control: NA Mobility: BR w/ BRP Disposition ICU  Goals of Care:  Last date of multidisciplinary goals of care discussion:pending  Family and staff present: not available  Summary of discussion: pending  Follow up goals of care discussion due: pending  Code Status: full code   Labs   CBC: Recent Labs  Lab 11/09/20 0332  WBC 4.3  HGB 6.9*  HCT 22.1*  MCV 89.5  PLT [id number]    Basic Metabolic Panel: Recent Labs  Lab 11/09/20 0332  NA 137  K 4.4  CL 106  CO2 16*  GLUCOSE 84  BUN 61*  CREATININE 6.87*  CALCIUM 8.8*   GFR: Estimated Creatinine Clearance: 16 mL/min (A) (by C-G formula based on SCr of 6.87 mg/dL (H)). Recent Labs  Lab 11/09/20 0332  WBC 4.3    Liver Function Tests: No results for input(s): AST, ALT, ALKPHOS, BILITOT, PROT, ALBUMIN in the last 168 hours. No results for input(s): LIPASE, AMYLASE in the last 168 hours. No results for input(s): AMMONIA in the last 168 hours.  ABG    Component Value Date/Time   HCO3 31.0 (H) 10/22/2007 1526   TCO2 33 10/22/2007 1526     Coagulation Profile: No results for input(s): INR, PROTIME in the last 168 hours.  Cardiac Enzymes: No results for input(s): CKTOTAL, CKMB, CKMBINDEX, TROPONINI in the last 168 hours.  HbA1C: Hemoglobin A1C  Date/Time Value Ref Range Status  07/10/2020 09:31 AM 5.0 4.0 - 5.6 % Final  04/14/2020 01:32 PM 5.0 4.0 - 5.6 % Final   HbA1c, POC (prediabetic range)  Date/Time Value Ref Range Status  07/10/2020 09:31 AM 5.0 (A) 5.7 - 6.4 % Final  04/14/2020 01:32 PM 5.0 (A) 5.7 - 6.4 % Final   HbA1c, POC (controlled diabetic range)  Date/Time Value Ref Range Status  07/10/2020 09:31 AM 5.0 0.0 - 7.0 % Final  04/14/2020 01:32 PM 5.0 0.0 - 7.0 % Final   HbA1c POC (<> result, manual entry)  Date/Time Value Ref Range Status  07/10/2020 09:31 AM 5.0 4.0 - 5.6 % Final  04/14/2020 01:32 PM 5.0 4.0 - 5.6 % Final    CBG: No results for input(s): GLUCAP in the last 168 hours.  Review  of Systems:   Review of Systems  Constitutional: Positive for malaise/fatigue.  HENT: Negative.   Eyes: Negative.   Respiratory: Positive for shortness of breath.   Cardiovascular: Positive for chest pain, orthopnea and leg swelling.  Gastrointestinal: Negative.   Genitourinary: Negative.   Musculoskeletal: Negative.   Neurological: Negative.   Endo/Heme/Allergies: Negative.   Psychiatric/Behavioral: Negative.      Past Medical History:  He,  has a past medical history of Anxiety, CKD (chronic kidney disease), stage IV ([place]), Depression, Dyslexia, Heart palpitations, High cholesterol, Hypertension, Hypertensive urgency, Polysubstance (including opioids) dependence, binge pattern ([place]) (08/2020), Stroke ([place]) (06/03/2017), and Tobacco abuse.   Surgical History:   Past Surgical History:  Procedure Laterality Date  . NO PAST SURGERIES       Social History:   reports that he has been smoking cigarettes. He has a 30.00 pack-year smoking history. He has never used smokeless tobacco. He reports current drug use. Drug: Marijuana. He reports  that he does not drink alcohol.   Family History:  His family history includes Diabetes type II in his brother; Hypertension in his brother and mother.   Allergies Allergies  Allergen Reactions  . Lactose Intolerance (Gi) Nausea And Vomiting  . Nitroglycerin Anxiety and Other (See Comments)    Pain and feels like he is on fire  . Other Swelling    All beans  . Isosorbide Itching    headaches     Home Medications  Prior to Admission medications   Medication Sig Start Date End Date Taking? Authorizing Provider  acetaminophen (TYLENOL) 500 MG tablet Take 1,000 mg by mouth as needed for mild pain.    [provider]  albuterol (PROAIR HFA) 108 (90 Base) MCG/ACT inhaler Inhale 1-2 puffs into the lungs every 6 (six) hours as needed for wheezing or shortness of breath. 03/18/20   [name], FNP  amLODipine (NORVASC) 10 MG tablet  Take 1 tablet (10 mg total) by mouth daily. 03/17/20 08/10/21  Jerline Pain, MD  aspirin EC 81 MG tablet Take 81 mg by mouth daily.    [provider]  atorvastatin (LIPITOR) 40 MG tablet Take 1 tablet (40 mg total) by mouth daily. Please keep upcoming appt in June with Dr. Marlou Porch before anymore refills. Thank you 03/10/20   [name], FNP  doxazosin (CARDURA) 8 MG tablet Take 2 tablets (16 mg total) by mouth daily. 03/17/20   Jerline Pain, MD  folic acid (FOLVITE) [id number] MCG tablet Take 0.5 tablets (400 mcg total) by mouth daily. 03/10/20   [name], FNP  furosemide (LASIX) 20 MG tablet TAKE 1 TABLET BY MOUTH ONCE DAILY AS NEEDED 07/31/20   [name], FNP  hydrALAZINE (APRESOLINE) 100 MG tablet Take 1 tablet (100 mg total) by mouth 3 (three) times daily. 03/17/20   Jerline Pain, MD  ibuprofen (ADVIL) 800 MG tablet Take 1 tablet (800 mg total) by mouth every 8 (eight) hours as needed. 03/10/20   [name], FNP  labetalol (NORMODYNE) 200 MG tablet Take 3 tablets (600 mg total) by mouth 3 (three) times daily. 03/17/20   Jerline Pain, MD  Multiple Vitamin (MULTIVITAMIN WITH MINERALS) TABS tablet Take 1 tablet by mouth daily as needed.    [provider]  ondansetron (ZOFRAN ODT) 4 MG disintegrating tablet Take 1 tablet (4 mg total) by mouth every 8 (eight) hours as needed for nausea or vomiting. 03/10/20   [name], FNP  PSYLLIUM PO Take 1 tablet by mouth daily.    [provider]  traZODone (DESYREL) 150 MG tablet Take 1 tablet (150 mg total) by mouth at bedtime. 04/14/20   [name], FNP     Critical care time: 32 min    Erick Colace ACNP-BC [facility] Pager # [phone] OR # [phone] if no answer

## 2020-11-09 NOTE — Progress Notes (Signed)
eLink Physician-Brief Progress Note Patient Name: Stanley [name] DOB: 12-18-1975 MRN: [id number]   Date of Service  11/09/2020  HPI/Events of Note  Hypomagnesemia - Mg++ = 1.6 and Creatinine = 7.01. Patient c/o leg cramps.   eICU Interventions  Plan: 1. Cautiously replace Mg++..     Intervention Category Major Interventions: Other:  Terran Hollenkamp Cornelia Copa 11/09/2020, 10:00 PM

## 2020-11-10 ENCOUNTER — Inpatient Hospital Stay (HOSPITAL_COMMUNITY): Payer: Medicaid Other

## 2020-11-10 DIAGNOSIS — I161 Hypertensive emergency: Secondary | ICD-10-CM | POA: Diagnosis not present

## 2020-11-10 DIAGNOSIS — N186 End stage renal disease: Secondary | ICD-10-CM | POA: Diagnosis not present

## 2020-11-10 LAB — TYPE AND SCREEN
ABO/RH(D): O POS
Antibody Screen: NEGATIVE
Unit division: 0

## 2020-11-10 LAB — BPAM RBC
Blood Product Expiration Date: 202203082359
ISSUE DATE / TIME: 202202071019
Unit Type and Rh: 5100

## 2020-11-10 LAB — CBC
HCT: 23.9 % — ABNORMAL LOW (ref 39.0–52.0)
Hemoglobin: 7.9 g/dL — ABNORMAL LOW (ref 13.0–17.0)
MCH: 28.3 pg (ref 26.0–34.0)
MCHC: 33.1 g/dL (ref 30.0–36.0)
MCV: 85.7 fL (ref 80.0–100.0)
Platelets: 268 10*3/uL (ref 150–400)
RBC: 2.79 MIL/uL — ABNORMAL LOW (ref 4.22–5.81)
RDW: 19.9 % — ABNORMAL HIGH (ref 11.5–15.5)
WBC: 5.1 10*3/uL (ref 4.0–10.5)
nRBC: 0 % (ref 0.0–0.2)

## 2020-11-10 LAB — IRON AND TIBC
Iron: 48 ug/dL (ref 45–182)
Saturation Ratios: 18 % (ref 17.9–39.5)
TIBC: 262 ug/dL (ref 250–450)
UIBC: 214 ug/dL

## 2020-11-10 LAB — RENAL FUNCTION PANEL
Albumin: 2.9 g/dL — ABNORMAL LOW (ref 3.5–5.0)
Anion gap: 13 (ref 5–15)
BUN: 59 mg/dL — ABNORMAL HIGH (ref 6–20)
CO2: 18 mmol/L — ABNORMAL LOW (ref 22–32)
Calcium: 8.2 mg/dL — ABNORMAL LOW (ref 8.9–10.3)
Chloride: 105 mmol/L (ref 98–111)
Creatinine, Ser: 6.55 mg/dL — ABNORMAL HIGH (ref 0.61–1.24)
GFR, Estimated: 10 mL/min — ABNORMAL LOW (ref >60)
Glucose, Bld: 90 mg/dL (ref 70–99)
Phosphorus: 3.8 mg/dL (ref 2.5–4.6)
Potassium: 3.8 mmol/L (ref 3.5–5.1)
Sodium: 136 mmol/L (ref 135–145)

## 2020-11-10 LAB — FERRITIN: Ferritin: 223 ng/mL (ref 24–336)

## 2020-11-10 MED ORDER — SODIUM BICARBONATE 650 MG PO TABS
650.0000 mg | ORAL_TABLET | Freq: Two times a day (BID) | ORAL | Status: DC
  Administered 2020-11-10 – 2020-11-11 (×3): 650 mg via ORAL
  Filled 2020-11-10 (×4): qty 1

## 2020-11-10 MED ORDER — FUROSEMIDE 80 MG PO TABS
80.0000 mg | ORAL_TABLET | Freq: Every day | ORAL | Status: DC
  Administered 2020-11-11: 80 mg via ORAL
  Filled 2020-11-10: qty 2

## 2020-11-10 MED ORDER — DARBEPOETIN ALFA 100 MCG/0.5ML IJ SOSY
100.0000 ug | PREFILLED_SYRINGE | Freq: Once | INTRAMUSCULAR | Status: AC
  Administered 2020-11-10: 100 ug via SUBCUTANEOUS
  Filled 2020-11-10: qty 0.5

## 2020-11-10 MED ORDER — SODIUM CHLORIDE 0.9 % IV SOLN
250.0000 mg | INTRAVENOUS | Status: AC
  Administered 2020-11-10 – 2020-11-12 (×3): 250 mg via INTRAVENOUS
  Filled 2020-11-10 (×3): qty 20

## 2020-11-10 MED ORDER — HYDRALAZINE HCL 20 MG/ML IJ SOLN
10.0000 mg | Freq: Four times a day (QID) | INTRAMUSCULAR | Status: DC | PRN
  Administered 2020-11-10: 10 mg via INTRAVENOUS
  Filled 2020-11-10: qty 1

## 2020-11-10 NOTE — Plan of Care (Signed)
Nutrition Education Note  RD consulted for nutrition education regarding a Low sodium diet.   Per pt he has stopped using the salt shaker and is no longer eating red meat. He has also tried to eat more fruits and vegetables. With further review pt continues to consume high sodium foods.    RD provided "Low Sodium Nutrition Therapy" handout from the Academy of [employer]. Reviewed patient's dietary recall. Provided examples on ways to decrease sodium intake in diet. Discouraged intake of processed foods and use of salt shaker. Encouraged fresh fruits and vegetables as well as whole grain sources of carbohydrates to maximize fiber intake. We discussed cooking foods to reduce sodium, reviewed recipes for salad dressings, marinara sauce, etc. Teach back method used. Pt does report difficulty since mom and niece do not like him in the kitchen. His hope is to get his entire family eating lower sodium.   Expect good compliance.  Body mass index is 27.52 kg/m. Pt meets criteria for overweight based on current BMI.  Current diet order is 2 gram sodium, patient is consuming approximately 100% of meals at this time. Labs and medications reviewed. No further nutrition interventions warranted at this time. RD contact information provided. If additional nutrition issues arise, please re-consult RD.  Lockie Pares., RD, LDN, CNSC See AMiON for contact information

## 2020-11-10 NOTE — Progress Notes (Signed)
[facility] Progress Note    Assessment/ Plan:   Stanley [name] is a/an 45 y.o. male with a past medical history CKD stage IV, HTN, chronic diastolic CHF, severe LVH who present w/ poorly controlled hypertension.  AKI on CKD stage IV Likely progression of CKD due to uncontrolled hypertension.  Patient received a dose of 40 mg IV Lasix yesterday morning and then an additional 80 mg IV Lasix in the afternoon.  He had 3600 mL of urine output over the last 24 hours.  Creatinine improved to 6.55 from 7.05, BUN 59, calcium of 8.2, phosphorus of 3.8, albumin of 2.9.  Renal ultrasound showed diffusely increased renal cortical echogenicity with trace perinephric free fluid bilaterally, nonspecific finding but most commonly seen with medical renal disease.  There was suboptimal visualization of the left kidney.  No remarkable findings in the bladder. -Continue 80 mg IV Lasix daily -Strict I's and O's -Daily renal function panel -No need for dialysis at this time.  If patient continues to improve he may not need dialysis but if remains stable or worsens he may require dialysis.  Hypertensive emergency Patient reports strict medication compliance with his home medications but given elevation on arrival it is difficult to determine if he was actually taking his medications.  He is still on a nicardipine drip as well as his home oral antihypertensives his blood pressures are normalizing although still moderately elevated. -Continue management per CCM  Anemia of CKD Patient with completely normal iron studies.  Hemoglobin decreased to 7.9 from 8.6 on previous check.  No obvious signs of bleeding at this time.  Car no need for ESA at this time.  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  Phosphorus of 3.8.  PTH is in process.  CHF exacerbation 2/2 chronic diastolic CHF Echocardiogram 11/09/2020 showed LVEF [id number], grade 1 diastolic dysfunction, no regional wall motion abnormalities.  Mildly  elevated pulmonary artery systolic pressure.  Mildly elevated right atrial pressure.  Small pericardial effusion noted. BNP elevated on arrival at 3521.  Chest x-ray showed bilateral pleural effusions. -Strict I's and O's -Lasix 80 mg IV daily -Continue management per primary team  Subjective:   Patient reports that he is doing well this morning has no acute concerns.  Is relieved that his kidney function has slightly improved.  We discussed that we are not in the clear yet and will continue to monitor his urine output as well as his kidney function.  He understands that he may require dialysis but no need for it today.   Objective:   BP (!) 143/71   Pulse 71   Temp 98.1 F (36.7 C) (Oral)   Resp (!) 24   Ht '5\' 11"'$  (1.803 m)   Wt 89.5 kg   SpO2 97%   BMI 27.52 kg/m   Intake/Output Summary (Last 24 hours) at 11/10/2020 0754 Last data filed at 11/10/2020 0500 Gross per 24 hour  Intake 1100.12 ml  Output 3600 ml  Net -2499.88 ml   Weight change: -1.219 kg  Physical Exam: Gen: Well-appearing, sitting comfortably in bed in no acute distress CVS: Regular rate and rhythm, no murmurs appreciated Resp: Normal work of breathing, crackles in lung bases bilaterally, edema in lower extremities bilaterally Abd: Soft, nontender, positive bowel sounds Ext: Edema to knees bilaterally  Imaging: DG Chest 2 View  Result Date: 11/09/2020 CLINICAL DATA:  Hypertension EXAM: CHEST - 2 VIEW COMPARISON:  02/10/2018 FINDINGS: Cardiomegaly. Interstitial prominence in the lower lobes. Suspect  small bilateral effusions. No acute bony abnormality or pneumothorax. IMPRESSION: Interstitial prominence most notable in the lower lobes bilaterally with small effusions. Cannot exclude infection or edema. Electronically Signed   By: Rolm Baptise M.D.   On: 11/09/2020 03:47   CT HEAD WO CONTRAST  Result Date: 11/09/2020 CLINICAL DATA:  Altered mental status, blurred vision for several days. EXAM: CT HEAD WITHOUT  CONTRAST TECHNIQUE: Contiguous axial images were obtained from the base of the skull through the vertex without intravenous contrast. COMPARISON:  Head CT Feb 10, 2018 and brain MRI Feb 10, 2018 FINDINGS: Brain: No evidence of acute infarction, hemorrhage, hydrocephalus, extra-axial collection or mass lesion/mass effect. Similar moderate burden of small vessel ischemic white matter disease. Vascular: No hyperdense vessel or unexpected calcification. Skull: Normal. Negative for fracture or focal lesion. Sinuses/Orbits: No acute finding. Other: None IMPRESSION: 1. No acute intracranial pathology. 2. Similar moderate burden of small vessel ischemic white matter disease. Electronically Signed   By: Dahlia Bailiff MD   On: 11/09/2020 21:54   US RENAL  Result Date: 11/09/2020 CLINICAL DATA:  Acute kidney injury, hypertensive, CKD stage 4 EXAM: RENAL / URINARY TRACT ULTRASOUND COMPLETE COMPARISON:  Ultrasound 09/30/2016 FINDINGS: Right Kidney: Renal measurements: 9.5 x 4.0 x 4.7 cm = volume: 94.2 mL. Diffusely increased renal cortical echogenicity. Trace perinephric free fluid. No visible renal mass, shadowing calculus or hydronephrosis is seen. Left Kidney: Renal measurements: 9 x 4.5 x 4.6 cm = volume: 99.5 mL. Suboptimal visualization of the left kidney due to extensive bowel gas and rib shadowing. Trace perinephric free fluid is seen. Diffusely increased renal cortical echogenicity. No visible renal mass, shadowing calculus or hydronephrosis within discernible portions of the left kidney. Bladder: Appears normal for degree of bladder distention. Bilateral bladder jets are identified. Other: None. IMPRESSION: 1. Diffusely increased renal cortical echogenicity with trace perinephric free fluid bilaterally, nonspecific finding but most commonly seen with medical renal disease. Suboptimal visualization of the left kidney. 2. Bladder is unremarkable. Electronically Signed   By: Lovena Le M.D.   On: 11/09/2020 21:53    ECHOCARDIOGRAM COMPLETE  Result Date: 11/09/2020    ECHOCARDIOGRAM REPORT   Patient Name:   Stanley [name] Date of Exam: 11/09/2020 Medical Rec #:  [id number]   Height:       71.0 in Accession #:    [id number]  Weight:       200.0 lb Date of Birth:  1975/10/08  BSA:          2.109 m Patient Age:    65 years    BP:           180/95 mmHg Patient Gender: M           HR:           96 bpm. Exam Location:  Inpatient Procedure: 2D Echo, Cardiac Doppler and Color Doppler Indications:    [place] respiratory distress R06.03  History:        Patient has prior history of Echocardiogram examinations, most                 recent 08/24/2019. Stroke; Risk Factors:Hypertension,                 Dyslipidemia and Current Smoker.  Sonographer:    Vickie Epley RDCS Referring Phys: [place]  1. Left ventricular ejection fraction, by estimation, is 65 to 70%. The left ventricle has normal function. The left ventricle has no regional wall motion  abnormalities. There is severe left ventricular hypertrophy. Left ventricular diastolic parameters  are consistent with Grade I diastolic dysfunction (impaired relaxation).  2. Right ventricular systolic function is normal. The right ventricular size is normal. There is mildly elevated pulmonary artery systolic pressure. The estimated right ventricular systolic pressure is [id number] mmHg.  3. Left atrial size was severely dilated.  4. Right atrial size was mildly dilated.  5. The mitral valve is normal in structure. Trivial mitral valve regurgitation. No evidence of mitral stenosis.  6. The aortic valve is tricuspid. Aortic valve regurgitation is mild. No aortic stenosis is present.  7. Aortic dilatation noted. There is mild dilatation of the ascending aorta, measuring 38 mm.  8. The inferior vena cava is dilated in size with >50% respiratory variability, suggesting right atrial pressure of 8 mmHg.  9. A small pericardial effusion is present. FINDINGS  Left Ventricle: Left  ventricular ejection fraction, by estimation, is 65 to 70%. The left ventricle has normal function. The left ventricle has no regional wall motion abnormalities. The left ventricular internal cavity size was normal in size. There is  severe left ventricular hypertrophy. Left ventricular diastolic parameters are consistent with Grade I diastolic dysfunction (impaired relaxation). Right Ventricle: The right ventricular size is normal. No increase in right ventricular wall thickness. Right ventricular systolic function is normal. There is mildly elevated pulmonary artery systolic pressure. The tricuspid regurgitant velocity is 2.99  m/s, and with an assumed right atrial pressure of 8 mmHg, the estimated right ventricular systolic pressure is [id number] mmHg. Left Atrium: Left atrial size was severely dilated. Right Atrium: Right atrial size was mildly dilated. Pericardium: A small pericardial effusion is present. Mitral Valve: The mitral valve is normal in structure. Trivial mitral valve regurgitation. No evidence of mitral valve stenosis. Tricuspid Valve: The tricuspid valve is normal in structure. Tricuspid valve regurgitation is mild. Aortic Valve: The aortic valve is tricuspid. Aortic valve regurgitation is mild. No aortic stenosis is present. Pulmonic Valve: The pulmonic valve was normal in structure. Pulmonic valve regurgitation is not visualized. Aorta: The aortic root is normal in size and structure and aortic dilatation noted. There is mild dilatation of the ascending aorta, measuring 38 mm. Venous: The inferior vena cava is dilated in size with greater than 50% respiratory variability, suggesting right atrial pressure of 8 mmHg. IAS/Shunts: No atrial level shunt detected by color flow Doppler.  LEFT VENTRICLE PLAX 2D LVIDd:         5.80 cm  Diastology LV PW:         1.50 cm  LV e' medial:    5.66 cm/s LV IVS:        1.40 cm  LV E/e' medial:  13.2 LVOT diam:     2.40 cm  LV e' lateral:   6.20 cm/s LV SV:         110       LV E/e' lateral: 12.0 LV SV Index:   52 LVOT Area:     4.52 cm  RIGHT VENTRICLE RV S prime:     17.50 cm/s TAPSE (M-mode): 3.0 cm LEFT ATRIUM            Index       RIGHT ATRIUM           Index LA diam:      3.90 cm  1.85 cm/m  RA Area:     22.80 cm LA Vol (A2C): 84.3 ml  39.98 ml/m RA Volume:   78.20 ml  37.09 ml/m LA Vol (A4C): 109.0 ml 51.69 ml/m  AORTIC VALVE LVOT Vmax:   146.00 cm/s LVOT Vmean:  94.900 cm/s LVOT VTI:    0.243 m  AORTA Ao Root diam: 3.80 cm Ao Asc diam:  3.80 cm MITRAL VALVE               TRICUSPID VALVE MV Area (PHT): 4.06 cm    TR Peak grad:   35.8 mmHg MV Decel Time: 187 msec    TR Vmax:        299.00 cm/s MV E velocity: 74.60 cm/s MV A velocity: 87.00 cm/s  SHUNTS MV E/A ratio:  0.86        Systemic VTI:  0.24 m                            Systemic Diam: 2.40 cm Loralie Champagne MD Electronically signed by Loralie Champagne MD Signature Date/Time: 11/09/2020/5:14:46 PM    Final     Labs: BMET Recent Labs  Lab 11/09/20 0332 11/09/20 1627 11/10/20 0327  NA 137 137 136  K 4.4 4.2 3.8  CL 106 106 105  CO2 16* 18* 18*  GLUCOSE 84 119* 90  BUN 61* 59* 59*  CREATININE 6.87* 7.05*  7.01* 6.55*  CALCIUM 8.8* 8.8* 8.2*  PHOS  --   --  3.8   CBC Recent Labs  Lab 11/09/20 0332 11/09/20 1627 11/10/20 0327  WBC 4.3 4.9 5.1  HGB 6.9* 8.6* 7.9*  HCT 22.1* 27.5* 23.9*  MCV 89.5 87.0 85.7  PLT 264 288 268    Medications:    . amLODipine  10 mg Oral Daily  . aspirin EC  81 mg Oral Daily  . atorvastatin  40 mg Oral Daily  . Chlorhexidine Gluconate Cloth  6 each Topical Daily  . doxazosin  16 mg Oral Daily  . furosemide  80 mg Intravenous Daily  . heparin  5,000 Units Subcutaneous Q8H  . hydrALAZINE  100 mg Oral TID  . influenza vac split quadrivalent PF  0.5 mL Intramuscular Tomorrow-1000  . labetalol  600 mg Oral TID     Gifford Shave, MD [place] Resident, PGY2 11/10/2020, 7:54 AM

## 2020-11-10 NOTE — Progress Notes (Signed)
RT set up CPAP and placed on patient. Patient tolerating settings well at this time. RT will monitor as needed.

## 2020-11-10 NOTE — Progress Notes (Signed)
eLink Physician-Brief Progress Note Patient Name: Stanley [name] DOB: Dec 26, 1975 MRN: [id number]   Date of Service  11/10/2020  HPI/Events of Note  Review of head CT scan w/o contrast reveals: 1. No acute intracranial pathology. 2. Similar moderate burden of small vessel ischemic white matter disease.  eICU Interventions  Continue present management.      Intervention Category Major Interventions: Other:  Jakevious Hollister Cornelia Copa 11/10/2020, 6:01 AM

## 2020-11-10 NOTE — Progress Notes (Signed)
NAME:  Stanley [name], MRN:  [id number], DOB:  November 24, 1975, LOS: 1 ADMISSION DATE:  11/09/2020, CONSULTATION DATE:  2/7 REFERRING MD:  Dr Rex Kras, CHIEF COMPLAINT:  Hypertensive emergency    Brief History:  45 year old male patient with history as mentioned below presented 2/7 with chief complaint of chest pressure, lower extremity edema, and orthopnea. Initial blood pressure 242/126 critical care admitted for hypertensive emergency on nicardipine drip  History of Present Illness:  This is a 45 year old male With history of very difficult to control hypertension presented to the emergency room on 2/7 with several day history of feeling "uneasy in his chest" orthopnea, and lower extremity swelling. In the emergency room initial recorded blood pressure 242/126. Additional diagnostics in the emergency room included: Creatinine of 6.87 up from baseline in the 4.2-5 range, 9 anion gap metabolic acidosis, mildly elevated troponins of 40, BNP of 3521, and portable chest x-ray for room mild interstitial prominence worrisome for possible evolving pulmonary edema. He states she takes his medications as ordered. Did have possible Covid exposure about 3 weeks prior. Therapeutic interventions to date have included: Initiation of nicardipine drip, IV Lasix, and supplemental oxygen. Because of acute hypertensive emergency critical care was asked to admit  Past Medical History:  HTN, poorly controlled, with prior admission for hypertensive crisis, prior episodes of leaving AMA, medical noncompliance. CKD stage IV Obstructive sleep apnea Prior epistaxis Syncope Anemia Tobacco abuse  Significant Hospital Events:  2/7 admitted nicardipine drip started  Consults:  nephro  Procedures:    Significant Diagnostic Tests:   Echocardiogram 2/7 LVEF 65-70% GI DD. RVSP 43.8 Severely dilated LA Mildly dilated RA  CT H 2/7> no acute intracranial abnormalities, unchanged moderate burden of small vessel ischemic white  matter disease  Micro Data:  SARS coronavirus 2: neg  Antimicrobials:  na   Interim History / Subjective:  SBP 150-160 this morning on low dose cardene gtt  + oral antihypertensives   Objective   Blood pressure (!) 143/71, pulse 71, temperature 98.1 F (36.7 C), temperature source Oral, resp. rate (!) 24, height '5\' 11"'$  (1.803 m), weight 89.5 kg, SpO2 97 %.        Intake/Output Summary (Last 24 hours) at 11/10/2020 0758 Last data filed at 11/10/2020 0500 Gross per 24 hour  Intake 1100.12 ml  Output 3600 ml  Net -2499.88 ml   Filed Weights   11/09/20 0329 11/10/20 0500  Weight: 90.7 kg 89.5 kg    Examination: General: wdwn middle aged M seated in bed NAD  HENT: NCAT pink mmm anicteric sclera Lungs: cta symmetrical chest expansion on RA  Cardiovascular: rrr s1s2 cap refill brisk 2+ pulses Abdomen:soft round ndnt + bowel sounds  Extremities: c.d.w no obvious deformity. Non-pitting edema  Neuro: AAOx4 following commands no deficit  GU: wnl. No foley. Clear yellow urine   Resolved Hospital Problem list   Pulmonary edema   Assessment & Plan:   Hypertensive Emergency -History of medical noncompliance previously, but reports he has been taking medications as prescribed. Pays for Rx in cash, thus we cant see refills in our system. Has had very difficult to control hypertension in the past -initial BP 242/126  at 0320 am.  -seems pt is quite compliant with home meds and can teach back numerous lifestyle changes that he states he follows  Plan SBP goal 140-160 -- wean cardene as able  RDN consult -- hope to optimize diet edu Continue PO antihypertensives   Acute on chronic diastolic HF -pulm edema, improved -  ECHO with LVEF 65-60%, GI DD. RVSP 43.8 Dilated LA dilated RA  P -BP control as above, fluid management as below  Acute on chronic renal failure ckd stage IV at base line w/ cr typically in the 4.3 to 5 range Had followed OP with nephro but fell off after missing  several appointments -- pt attributes this to sleeping through appointments due to changes in BP meds making him very tired and states he wants to follow with nephro again  Plan nephro consult Trend renal indices  Lasix 80 mg BID  Vein mapping 2/8, no indication for RRT at present but may require in future  Anemia -anemia of chronic disease 2/2 renal disease -s/p 1 PRBC 2/7 Plan Trend CBC, transfuse if needed  Consideration of esa per nephro -- looks like at this time no indication   Best practice (evaluated daily)  Diet: low salt Pain/Anxiety/Delirium protocol (if indicated): NA VAP protocol (if indicated): NA DVT prophylaxis: [place] heparin  GI prophylaxis: NA Glucose control: NA Mobility: BR w/ BRP Disposition ICU  Goals of Care:  Last date of multidisciplinary goals of care discussion:pending  Family and staff present: not available  Summary of discussion: pending  Follow up goals of care discussion due: pending  Code Status: full code   Labs   CBC: Recent Labs  Lab 11/09/20 0332 11/09/20 1627 11/10/20 0327  WBC 4.3 4.9 5.1  HGB 6.9* 8.6* 7.9*  HCT 22.1* 27.5* 23.9*  MCV 89.5 87.0 85.7  PLT 264 288 [id number]    Basic Metabolic Panel: Recent Labs  Lab 11/09/20 0332 11/09/20 1627 11/10/20 0327  NA 137 137 136  K 4.4 4.2 3.8  CL 106 106 105  CO2 16* 18* 18*  GLUCOSE 84 119* 90  BUN 61* 59* 59*  CREATININE 6.87* 7.05*  7.01* 6.55*  CALCIUM 8.8* 8.8* 8.2*  MG  --  1.6*  --   PHOS  --   --  3.8   GFR: Estimated Creatinine Clearance: 15.5 mL/min (A) (by C-G formula based on SCr of 6.55 mg/dL (H)). Recent Labs  Lab 11/09/20 0332 11/09/20 1627 11/10/20 0327  WBC 4.3 4.9 5.1    Liver Function Tests: Recent Labs  Lab 11/09/20 1627 11/10/20 0327  AST 30  --   ALT 25  --   ALKPHOS 90  --   BILITOT 1.0  --   PROT 6.8  --   ALBUMIN 3.4* 2.9*   No results for input(s): LIPASE, AMYLASE in the last 168 hours. No results for input(s): AMMONIA in the last  168 hours.  ABG    Component Value Date/Time   HCO3 31.0 (H) 10/22/2007 1526   TCO2 33 10/22/2007 1526     Coagulation Profile: No results for input(s): INR, PROTIME in the last 168 hours.  Cardiac Enzymes: No results for input(s): CKTOTAL, CKMB, CKMBINDEX, TROPONINI in the last 168 hours.  HbA1C: Hemoglobin A1C  Date/Time Value Ref Range Status  07/10/2020 09:31 AM 5.0 4.0 - 5.6 % Final  04/14/2020 01:32 PM 5.0 4.0 - 5.6 % Final   HbA1c, POC (prediabetic range)  Date/Time Value Ref Range Status  07/10/2020 09:31 AM 5.0 (A) 5.7 - 6.4 % Final  04/14/2020 01:32 PM 5.0 (A) 5.7 - 6.4 % Final   HbA1c, POC (controlled diabetic range)  Date/Time Value Ref Range Status  07/10/2020 09:31 AM 5.0 0.0 - 7.0 % Final  04/14/2020 01:32 PM 5.0 0.0 - 7.0 % Final   HbA1c POC (<> result, manual  entry)  Date/Time Value Ref Range Status  07/10/2020 09:31 AM 5.0 4.0 - 5.6 % Final  04/14/2020 01:32 PM 5.0 4.0 - 5.6 % Final    CBG: No results for input(s): GLUCAP in the last 168 hours.     CRITICAL CARE Performed by: Cristal Generous   Total critical care time: 43 minutes  Critical care time was exclusive of separately billable procedures and treating other patients.  Critical care was necessary to treat or prevent imminent or life-threatening deterioration.  Critical care was time spent personally by me on the following activities: development of treatment plan with patient and/or surrogate as well as nursing, discussions with consultants, evaluation of patient's response to treatment, examination of patient, obtaining history from patient or surrogate, ordering and performing treatments and interventions, ordering and review of laboratory studies, ordering and review of radiographic studies, pulse oximetry and re-evaluation of patient's condition.  Eliseo Gum MSN, AGACNP-BC [place] [id number] If no answer, [id number] 11/10/2020, 7:58 AM

## 2020-11-11 DIAGNOSIS — I161 Hypertensive emergency: Secondary | ICD-10-CM | POA: Diagnosis not present

## 2020-11-11 LAB — RENAL FUNCTION PANEL
Albumin: 2.9 g/dL — ABNORMAL LOW (ref 3.5–5.0)
Anion gap: 13 (ref 5–15)
BUN: 59 mg/dL — ABNORMAL HIGH (ref 6–20)
CO2: 18 mmol/L — ABNORMAL LOW (ref 22–32)
Calcium: 8 mg/dL — ABNORMAL LOW (ref 8.9–10.3)
Chloride: 106 mmol/L (ref 98–111)
Creatinine, Ser: 6.52 mg/dL — ABNORMAL HIGH (ref 0.61–1.24)
GFR, Estimated: 10 mL/min — ABNORMAL LOW (ref >60)
Glucose, Bld: 81 mg/dL (ref 70–99)
Phosphorus: 3.5 mg/dL (ref 2.5–4.6)
Potassium: 4.1 mmol/L (ref 3.5–5.1)
Sodium: 137 mmol/L (ref 135–145)

## 2020-11-11 LAB — PARATHYROID HORMONE, INTACT (NO CA): PTH: 105 pg/mL — ABNORMAL HIGH (ref 15–65)

## 2020-11-11 MED ORDER — HYDRALAZINE HCL 50 MG PO TABS
100.0000 mg | ORAL_TABLET | Freq: Four times a day (QID) | ORAL | Status: DC
  Administered 2020-11-11 – 2020-11-14 (×14): 100 mg via ORAL
  Filled 2020-11-11 (×14): qty 2

## 2020-11-11 MED ORDER — HYDRALAZINE HCL 20 MG/ML IJ SOLN: 10.0000 mg | INTRAMUSCULAR | Status: DC | PRN

## 2020-11-11 MED ORDER — SODIUM BICARBONATE 650 MG PO TABS
1300.0000 mg | ORAL_TABLET | Freq: Two times a day (BID) | ORAL | Status: DC
  Administered 2020-11-11 – 2020-11-14 (×6): 1300 mg via ORAL
  Filled 2020-11-11 (×6): qty 2

## 2020-11-11 MED ORDER — HYDRALAZINE HCL 20 MG/ML IJ SOLN
10.0000 mg | INTRAMUSCULAR | Status: DC | PRN
  Administered 2020-11-11 (×2): 10 mg via INTRAVENOUS
  Filled 2020-11-11: qty 1

## 2020-11-11 NOTE — Progress Notes (Signed)
eLink Physician-Brief Progress Note Patient Name: Stanley [name] DOB: [birth year] MRN: [id number]   Date of Service  11/11/2020  HPI/Events of Note  Hypertension - BP = 173/94.   eICU Interventions  Plan: 1. Increase Hydralazine to 100 mg PO Q 6 hours.      Intervention Category Major Interventions: Hypertension - evaluation and management  Hernandez Losasso Eugene 11/11/2020, 5:28 AM

## 2020-11-11 NOTE — Progress Notes (Addendum)
eLink Physician-Brief Progress Note Patient Name: HARUMI GULLICK DOB: 02-26-1976 MRN: [id number]   Date of Service  11/11/2020  HPI/Events of Note  Hypertension - BP = 174/85.   eICU Interventions  Plan: 1. Will increase Hydralazine to 10 mg IV Q 4 hours PRN SBP > 150.      Intervention Category Major Interventions: Hypertension - evaluation and management  Sonnie Pawloski Eugene 11/11/2020, 1:56 AM

## 2020-11-11 NOTE — Progress Notes (Incomplete)
NEW ADMISSION NOTE New Admission Note:   Arrival Method: wheel chair Mental Orientation:  A$O x4 Telemetry: Assessment: Completed Skin: IV: Pain: Tubes: Safety Measures: Safety Fall Prevention Plan has been given, discussed and signed Admission: Completed [facility] Orientation: Patient has been orientated to the room, unit and staff.  Family:  Orders have been reviewed and implemented. Will continue to monitor the patient. Call light has been placed within reach and bed alarm has been activated.   Berneta Levins, RN

## 2020-11-11 NOTE — Progress Notes (Addendum)
[facility] Progress Note    Assessment/ Plan:   Stanley BELLS is a/an 45 y.o. male with a past medical history CKD stage IV, HTN, chronic diastolic CHF, severe LVH who present w/ poorly controlled hypertension.  AKI on CKD stage IV Likely progression of CKD due to uncontrolled hypertension.  Patient received a dose of 40 mg IV was transitioned to 80 mg IV Lasix daily. Renal ultrasound showed diffusely increased renal cortical echogenicity with trace perinephric free fluid bilaterally, nonspecific finding but most commonly seen with medical renal disease.  There was suboptimal visualization of the left kidney.  No remarkable findings in the bladder. His eyes and over the last 24 hours patient has had 5 L of urine output. Creatinine slightly improved to 6.52 from 6.55, BUN 59, K4.1, albumin 2.9. -Transition to 80 mg Lasix p.o. today -Strict I's and O's -Daily renal function panel -No need for dialysis at this time.  If patient continues to improve he may not need dialysis but if remains stable or worsens he may require dialysis.  Hypertensive emergency Patient reports strict medication compliance with his home medications but given elevation on arrival it is difficult to determine if he was actually taking his medications. Patient was having lower blood pressures yesterday so the nicardipine drip was discontinued. Patient was continued on oral antihypertensives. Overnight he had elevated blood pressures so hydralazine 10 mg IV every 4 hours was added for SBP greater than 150.  It was then adjusted to 100 mg every 6 hours. -Continue management per CCM  Anemia of CKD Patient with completely normal iron studies.  Hemoglobin decreased to 7.9 on 2/8. No obvious signs of bleeding at this time. No need for ESA at this time.  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  Phosphorus of 3.8. PTH elevated at 105.  CHF exacerbation 2/2 chronic diastolic CHF Echocardiogram 11/09/2020 showed  LVEF [id number], grade 1 diastolic dysfunction, no regional wall motion abnormalities.  Mildly elevated pulmonary artery systolic pressure.  Mildly elevated right atrial pressure.  Small pericardial effusion noted. BNP elevated on arrival at 3521.  Chest x-ray showed bilateral pleural effusions. -Strict I's and O's -Lasix 80 milligrams p.o. daily -Continue management per primary team  Subjective:   Patient denies any acute events overnight.  Reports that his blood pressure became a little elevated yesterday when they initiated the CPAP because he felt like he could not breathe with the CPAP on.  Feels like he is urinating a considerable amount.   Objective:   BP (!) 168/93   Pulse 63   Temp 98.8 F (37.1 C) (Oral)   Resp (!) 8   Ht '5\' 11"'$  (1.803 m)   Wt 89.6 kg   SpO2 99%   BMI 27.55 kg/m   Intake/Output Summary (Last 24 hours) at 11/11/2020 [id number] Last data filed at 11/11/2020 0745 Gross per 24 hour  Intake 171.33 ml  Output 5675 ml  Net -5503.67 ml   Weight change: 0.1 kg  Physical Exam: Gen: Lying comfortably in bed, no acute distress CVS: Regular rate and rhythm, no murmurs appreciated Resp: Crackles in lung bases bilaterally but improved from previous exams, normal work of breathing Abd: Soft, nontender, positive bowel sounds Ext: Edema to the mid calf bilaterally, improved from previous exam  Imaging: CT HEAD WO CONTRAST  Result Date: 11/09/2020 CLINICAL DATA:  Altered mental status, blurred vision for several days. EXAM: CT HEAD WITHOUT CONTRAST TECHNIQUE: Contiguous axial images were obtained from the base  of the skull through the vertex without intravenous contrast. COMPARISON:  Head CT Feb 10, 2018 and brain MRI Feb 10, 2018 FINDINGS: Brain: No evidence of acute infarction, hemorrhage, hydrocephalus, extra-axial collection or mass lesion/mass effect. Similar moderate burden of small vessel ischemic white matter disease. Vascular: No hyperdense vessel or unexpected calcification.  Skull: Normal. Negative for fracture or focal lesion. Sinuses/Orbits: No acute finding. Other: None IMPRESSION: 1. No acute intracranial pathology. 2. Similar moderate burden of small vessel ischemic white matter disease. Electronically Signed   By: Dahlia Bailiff MD   On: 11/09/2020 21:54   US RENAL  Result Date: 11/09/2020 CLINICAL DATA:  Acute kidney injury, hypertensive, CKD stage 4 EXAM: RENAL / URINARY TRACT ULTRASOUND COMPLETE COMPARISON:  Ultrasound 09/30/2016 FINDINGS: Right Kidney: Renal measurements: 9.5 x 4.0 x 4.7 cm = volume: 94.2 mL. Diffusely increased renal cortical echogenicity. Trace perinephric free fluid. No visible renal mass, shadowing calculus or hydronephrosis is seen. Left Kidney: Renal measurements: 9 x 4.5 x 4.6 cm = volume: 99.5 mL. Suboptimal visualization of the left kidney due to extensive bowel gas and rib shadowing. Trace perinephric free fluid is seen. Diffusely increased renal cortical echogenicity. No visible renal mass, shadowing calculus or hydronephrosis within discernible portions of the left kidney. Bladder: Appears normal for degree of bladder distention. Bilateral bladder jets are identified. Other: None. IMPRESSION: 1. Diffusely increased renal cortical echogenicity with trace perinephric free fluid bilaterally, nonspecific finding but most commonly seen with medical renal disease. Suboptimal visualization of the left kidney. 2. Bladder is unremarkable. Electronically Signed   By: Lovena Le M.D.   On: 11/09/2020 21:53   ECHOCARDIOGRAM COMPLETE  Result Date: 11/09/2020    ECHOCARDIOGRAM REPORT   Patient Name:   [name] Date of Exam: 11/09/2020 Medical Rec #:  [id number]   Height:       71.0 in Accession #:    [id number]  Weight:       200.0 lb Date of Birth:  06/04/1976  BSA:          2.109 m Patient Age:    31 years    BP:           180/95 mmHg Patient Gender: M           HR:           96 bpm. Exam Location:  Inpatient Procedure: 2D Echo, Cardiac Doppler and  Color Doppler Indications:    [place] respiratory distress R06.03  History:        Patient has prior history of Echocardiogram examinations, most                 recent 08/24/2019. Stroke; Risk Factors:Hypertension,                 Dyslipidemia and Current Smoker.  Sonographer:    Vickie Epley RDCS Referring Phys: [place]  1. Left ventricular ejection fraction, by estimation, is 65 to 70%. The left ventricle has normal function. The left ventricle has no regional wall motion abnormalities. There is severe left ventricular hypertrophy. Left ventricular diastolic parameters  are consistent with Grade I diastolic dysfunction (impaired relaxation).  2. Right ventricular systolic function is normal. The right ventricular size is normal. There is mildly elevated pulmonary artery systolic pressure. The estimated right ventricular systolic pressure is [id number] mmHg.  3. Left atrial size was severely dilated.  4. Right atrial size was mildly dilated.  5. The mitral valve is normal in  structure. Trivial mitral valve regurgitation. No evidence of mitral stenosis.  6. The aortic valve is tricuspid. Aortic valve regurgitation is mild. No aortic stenosis is present.  7. Aortic dilatation noted. There is mild dilatation of the ascending aorta, measuring 38 mm.  8. The inferior vena cava is dilated in size with >50% respiratory variability, suggesting right atrial pressure of 8 mmHg.  9. A small pericardial effusion is present. FINDINGS  Left Ventricle: Left ventricular ejection fraction, by estimation, is 65 to 70%. The left ventricle has normal function. The left ventricle has no regional wall motion abnormalities. The left ventricular internal cavity size was normal in size. There is  severe left ventricular hypertrophy. Left ventricular diastolic parameters are consistent with Grade I diastolic dysfunction (impaired relaxation). Right Ventricle: The right ventricular size is normal. No increase in right  ventricular wall thickness. Right ventricular systolic function is normal. There is mildly elevated pulmonary artery systolic pressure. The tricuspid regurgitant velocity is 2.99  m/s, and with an assumed right atrial pressure of 8 mmHg, the estimated right ventricular systolic pressure is [id number] mmHg. Left Atrium: Left atrial size was severely dilated. Right Atrium: Right atrial size was mildly dilated. Pericardium: A small pericardial effusion is present. Mitral Valve: The mitral valve is normal in structure. Trivial mitral valve regurgitation. No evidence of mitral valve stenosis. Tricuspid Valve: The tricuspid valve is normal in structure. Tricuspid valve regurgitation is mild. Aortic Valve: The aortic valve is tricuspid. Aortic valve regurgitation is mild. No aortic stenosis is present. Pulmonic Valve: The pulmonic valve was normal in structure. Pulmonic valve regurgitation is not visualized. Aorta: The aortic root is normal in size and structure and aortic dilatation noted. There is mild dilatation of the ascending aorta, measuring 38 mm. Venous: The inferior vena cava is dilated in size with greater than 50% respiratory variability, suggesting right atrial pressure of 8 mmHg. IAS/Shunts: No atrial level shunt detected by color flow Doppler.  LEFT VENTRICLE PLAX 2D LVIDd:         5.80 cm  Diastology LV PW:         1.50 cm  LV e' medial:    5.66 cm/s LV IVS:        1.40 cm  LV E/e' medial:  13.2 LVOT diam:     2.40 cm  LV e' lateral:   6.20 cm/s LV SV:         110      LV E/e' lateral: 12.0 LV SV Index:   52 LVOT Area:     4.52 cm  RIGHT VENTRICLE RV S prime:     17.50 cm/s TAPSE (M-mode): 3.0 cm LEFT ATRIUM            Index       RIGHT ATRIUM           Index LA diam:      3.90 cm  1.85 cm/m  RA Area:     22.80 cm LA Vol (A2C): 84.3 ml  39.98 ml/m RA Volume:   78.20 ml  37.09 ml/m LA Vol (A4C): 109.0 ml 51.69 ml/m  AORTIC VALVE LVOT Vmax:   146.00 cm/s LVOT Vmean:  94.900 cm/s LVOT VTI:    0.243 m  AORTA Ao  Root diam: 3.80 cm Ao Asc diam:  3.80 cm MITRAL VALVE               TRICUSPID VALVE MV Area (PHT): 4.06 cm    TR Peak grad:   35.8  mmHg MV Decel Time: 187 msec    TR Vmax:        299.00 cm/s MV E velocity: 74.60 cm/s MV A velocity: 87.00 cm/s  SHUNTS MV E/A ratio:  0.86        Systemic VTI:  0.24 m                            Systemic Diam: 2.40 cm Loralie Champagne MD Electronically signed by Loralie Champagne MD Signature Date/Time: 11/09/2020/5:14:46 PM    Final    VAS [place] UPPER EXT VEIN MAPPING (PRE-OP AVF)  Result Date: 11/10/2020 UPPER EXTREMITY VEIN MAPPING  Indications: Pre-access. Comparison Study: No previous Performing Technologist: Vonzell Schlatter RVT  Examination Guidelines: A complete evaluation includes B-mode imaging, spectral Doppler, color Doppler, and power Doppler as needed of all accessible portions of each vessel. Bilateral testing is considered an integral part of a complete examination. Limited examinations for reoccurring indications may be performed as noted. +-----------------+-------------+----------+--------+ Right Cephalic   Diameter (cm)Depth (cm)Findings +-----------------+-------------+----------+--------+ Shoulder             0.26        0.37            +-----------------+-------------+----------+--------+ Prox upper arm       0.25        0.29            +-----------------+-------------+----------+--------+ Mid upper arm        0.21        0.32            +-----------------+-------------+----------+--------+ Dist upper arm       0.30        0.35            +-----------------+-------------+----------+--------+ Antecubital fossa    0.32        0.37            +-----------------+-------------+----------+--------+ Prox forearm         0.27        0.42            +-----------------+-------------+----------+--------+ Mid forearm          0.22        0.23            +-----------------+-------------+----------+--------+ Dist forearm         0.27        0.21             +-----------------+-------------+----------+--------+ Wrist                0.24        0.25            +-----------------+-------------+----------+--------+ +-----------------+-------------+----------+---------------------+ Right Basilic    Diameter (cm)Depth (cm)      Findings        +-----------------+-------------+----------+---------------------+ Prox upper arm       0.43        0.68                         +-----------------+-------------+----------+---------------------+ Mid upper arm        0.57        0.58                         +-----------------+-------------+----------+---------------------+ Dist upper arm       0.60        0.53                         +-----------------+-------------+----------+---------------------+  Antecubital fossa    0.24        0.20                         +-----------------+-------------+----------+---------------------+ Prox forearm                            not visualized and IV +-----------------+-------------+----------+---------------------+ Mid forearm          0.21        0.18                         +-----------------+-------------+----------+---------------------+ Distal forearm       0.22        0.18                         +-----------------+-------------+----------+---------------------+ Wrist                0.17        0.14                         +-----------------+-------------+----------+---------------------+ +-----------------+-------------+----------+---------+ Left Cephalic    Diameter (cm)Depth (cm)Findings  +-----------------+-------------+----------+---------+ Shoulder             0.28        0.27             +-----------------+-------------+----------+---------+ Prox upper arm       0.25        0.33             +-----------------+-------------+----------+---------+ Mid upper arm        0.30        0.31             +-----------------+-------------+----------+---------+ Dist  upper arm       0.35        0.36             +-----------------+-------------+----------+---------+ Antecubital fossa    0.38        0.52   branching +-----------------+-------------+----------+---------+ Prox forearm         0.23        0.30   branching +-----------------+-------------+----------+---------+ Mid forearm          0.20        0.26             +-----------------+-------------+----------+---------+ Dist forearm         0.23        0.24             +-----------------+-------------+----------+---------+ Wrist                0.23        0.22             +-----------------+-------------+----------+---------+ +-----------------+-------------+----------+--------------+ Left Basilic     Diameter (cm)Depth (cm)   Findings    +-----------------+-------------+----------+--------------+ Prox upper arm       0.49        0.92                  +-----------------+-------------+----------+--------------+ Mid upper arm        0.51        0.93                  +-----------------+-------------+----------+--------------+ Dist upper arm       0.46  0.85                  +-----------------+-------------+----------+--------------+ Antecubital fossa    0.12                              +-----------------+-------------+----------+--------------+ Prox forearm         0.13        0.15                  +-----------------+-------------+----------+--------------+ Mid forearm          0.12        0.15                  +-----------------+-------------+----------+--------------+ Distal forearm                          not visualized +-----------------+-------------+----------+--------------+ Elbow                                   not visualized +-----------------+-------------+----------+--------------+ *See table(s) above for measurements and observations.  Diagnosing physician: Deitra Mayo MD Electronically signed by Deitra Mayo MD on  11/10/2020 at 9:03:19 PM.    Final     Labs: BMET Recent Labs  Lab 11/09/20 0332 11/09/20 1627 11/10/20 0327 11/11/20 0024  NA 137 137 136 137  K 4.4 4.2 3.8 4.1  CL 106 106 105 106  CO2 16* 18* 18* 18*  GLUCOSE 84 119* 90 81  BUN 61* 59* 59* 59*  CREATININE 6.87* 7.05*  7.01* 6.55* 6.52*  CALCIUM 8.8* 8.8* 8.2* 8.0*  PHOS  --   --  3.8 3.5   CBC Recent Labs  Lab 11/09/20 0332 11/09/20 1627 11/10/20 0327  WBC 4.3 4.9 5.1  HGB 6.9* 8.6* 7.9*  HCT 22.1* 27.5* 23.9*  MCV 89.5 87.0 85.7  PLT 264 288 268    Medications:    . amLODipine  10 mg Oral Daily  . aspirin EC  81 mg Oral Daily  . atorvastatin  40 mg Oral Daily  . Chlorhexidine Gluconate Cloth  6 each Topical Daily  . doxazosin  16 mg Oral Daily  . furosemide  80 mg Oral Daily  . heparin  5,000 Units Subcutaneous Q8H  . hydrALAZINE  100 mg Oral Q6H  . influenza vac split quadrivalent PF  0.5 mL Intramuscular Tomorrow-1000  . labetalol  600 mg Oral TID  . sodium bicarbonate  650 mg Oral BID     Gifford Shave, MD [facility] Resident, PGY2 11/11/2020, 8:03 AM

## 2020-11-11 NOTE — Plan of Care (Signed)
  Problem: Clinical Measurements: Goal: Respiratory complications will improve Outcome: Progressing   Problem: Activity: Goal: Risk for activity intolerance will decrease Outcome: Progressing   

## 2020-11-11 NOTE — Progress Notes (Signed)
Patient refused CPAP for the night. Sp02=97% on Romm Air

## 2020-11-11 NOTE — Progress Notes (Addendum)
NAME:  Stanley [name], MRN:  [id number], DOB:  1975-12-24, LOS: 2 ADMISSION DATE:  11/09/2020, CONSULTATION DATE:  2/7 REFERRING MD:  Dr Rex Kras, CHIEF COMPLAINT:  Hypertensive emergency    Brief History:  45 year old male patient with history as mentioned below presented 2/7 with chief complaint of chest pressure, lower extremity edema, and orthopnea. Initial blood pressure 242/126 critical care admitted for hypertensive emergency on nicardipine drip  History of Present Illness:  This is a 45 year old male With history of very difficult to control hypertension presented to the emergency room on 2/7 with several day history of feeling "uneasy in his chest" orthopnea, and lower extremity swelling. In the emergency room initial recorded blood pressure 242/126. Additional diagnostics in the emergency room included: Creatinine of 6.87 up from baseline in the 4.2-5 range, 9 anion gap metabolic acidosis, mildly elevated troponins of 40, BNP of 3521, and portable chest x-ray for room mild interstitial prominence worrisome for possible evolving pulmonary edema. He states she takes his medications as ordered. Did have possible Covid exposure about 3 weeks prior. Therapeutic interventions to date have included: Initiation of nicardipine drip, IV Lasix, and supplemental oxygen. Because of acute hypertensive emergency critical care was asked to admit  Past Medical History:  HTN, poorly controlled, with prior admission for hypertensive crisis, prior episodes of leaving AMA, medical noncompliance. CKD stage IV Obstructive sleep apnea Prior epistaxis Syncope Anemia Tobacco abuse  Significant Hospital Events:  2/7 admitted nicardipine drip started 2/8 cardene off 2/9 transferring out of ICU  Consults:  nephro  Procedures:    Significant Diagnostic Tests:   Echocardiogram 2/7 LVEF 65-70% GI DD. RVSP 43.8 Severely dilated LA Mildly dilated RA  CT H 2/7> no acute intracranial abnormalities, unchanged  moderate burden of small vessel ischemic white matter disease  Micro Data:  SARS coronavirus 2: neg  Antimicrobials:  na   Interim History / Subjective:  SBPs have been 1302- 180, largely 140-160 off of cardene   Objective   Blood pressure (!) 165/98, pulse 74, temperature 97.6 F (36.4 C), temperature source Oral, resp. rate (!) 23, height '5\' 11"'$  (1.803 m), weight 89.6 kg, SpO2 99 %.        Intake/Output Summary (Last 24 hours) at 11/11/2020 1027 Last data filed at 11/11/2020 0849 Gross per 24 hour  Intake 386.23 ml  Output 4975 ml  Net -4588.77 ml   Filed Weights   11/09/20 0329 11/10/20 0500 11/11/20 0500  Weight: 90.7 kg 89.5 kg 89.6 kg    Examination: General: wdwn middle aged M seated in bed NAD   HENT: NCAT pink mm anicteric sclera  Lungs: CTAb symmetrical chest expansion on RA  Cardiovascular: rrr s1s2 no rgm 2+ pulses  Abdomen: soft round ndnt  Extremities: no acute joint deformity. No pitting edema  Neuro:  AAOx4 following commands, no focal deficits  GU: wnl. Clear yellow urine in bedside urinal  Skin: c/d/w/i  Resolved Hospital Problem list   Pulmonary edema   Assessment & Plan:   Hypertensive Emergency, improved  HTN, difficult to control -History of medical noncompliance previously, but reports he has been taking medications as prescribed. Pays for Rx in cash, thus we cant see refills in our system. Has had very difficult to control hypertension in the past -initial BP 242/126  at 0320 am.  -seems pt is quite compliant with home meds and can teach back numerous lifestyle changes that he states he follows. Some room for further improvement, but has a good start and  is motivated pt.  Plan RDN consult placed 2/8 -- hope to optimize diet edu Continue PO antihypertensives Goal SBP 140-160. This pt lives at a very high SBP (180s), ultimately do want good BP control but goal slightly above normotensive ok for now Off gtt and stable for transfer out of  ICU  Sleep disordered breathing -OP sleep study w evidence of sleep apnea P -qHS CPAP     Acute on chronic diastolic HF -pulm edema, improved -ECHO with LVEF 65-60%, GI DD. RVSP 43.8 Dilated LA dilated RA   P -diuresis as below -- edema has improved -BP control as above   Acute on chronic renal failure ckd stage IV at base line w/ cr typically in the 4.3 to 5 range Had followed OP with nephro but fell off after missing several appointments -- pt attributes this to sleeping through appointments due to changes in BP meds making him very tired and states he wants to follow with nephro again  Plan nephro following, appreciate recs Trend renal indices -- Cr slightly down 2/9 from prior. BUN stable  Lasix 80 mg BID -- defer changes per nephro no indication for RRT  Anemia -anemia of chronic disease 2/2 renal disease -s/p 1 PRBC 2/7 Plan Trend CBC, transfuse if needed  ESA, iron per nephro   Best practice (evaluated daily)  Diet: low salt, appreciate RDN education Pain/Anxiety/Delirium protocol (if indicated): NA VAP protocol (if indicated): NA DVT prophylaxis: [place] heparin  GI prophylaxis: NA Glucose control: NA Mobility: PT Disposition Transfer out of ICU. To [facility] 2/10   Goals of Care:  Last date of multidisciplinary goals of care discussion:pending  Family and staff present: not available  Summary of discussion: pending  Follow up goals of care discussion due: pending  Code Status: full code   Labs   CBC: Recent Labs  Lab 11/09/20 0332 11/09/20 1627 11/10/20 0327  WBC 4.3 4.9 5.1  HGB 6.9* 8.6* 7.9*  HCT 22.1* 27.5* 23.9*  MCV 89.5 87.0 85.7  PLT 264 288 [id number]    Basic Metabolic Panel: Recent Labs  Lab 11/09/20 0332 11/09/20 1627 11/10/20 0327 11/11/20 0024  NA 137 137 136 137  K 4.4 4.2 3.8 4.1  CL 106 106 105 106  CO2 16* 18* 18* 18*  GLUCOSE 84 119* 90 81  BUN 61* 59* 59* 59*  CREATININE 6.87* 7.05*  7.01* 6.55* 6.52*  CALCIUM 8.8* 8.8* 8.2* 8.0*   MG  --  1.6*  --   --   PHOS  --   --  3.8 3.5   GFR: Estimated Creatinine Clearance: 15.6 mL/min (A) (by C-G formula based on SCr of 6.52 mg/dL (H)). Recent Labs  Lab 11/09/20 0332 11/09/20 1627 11/10/20 0327  WBC 4.3 4.9 5.1    Liver Function Tests: Recent Labs  Lab 11/09/20 1627 11/10/20 0327 11/11/20 0024  AST 30  --   --   ALT 25  --   --   ALKPHOS 90  --   --   BILITOT 1.0  --   --   PROT 6.8  --   --   ALBUMIN 3.4* 2.9* 2.9*   No results for input(s): LIPASE, AMYLASE in the last 168 hours. No results for input(s): AMMONIA in the last 168 hours.  ABG    Component Value Date/Time   HCO3 31.0 (H) 10/22/2007 1526   TCO2 33 10/22/2007 1526     Coagulation Profile: No results for input(s): INR, PROTIME in the last 168  hours.  Cardiac Enzymes: No results for input(s): CKTOTAL, CKMB, CKMBINDEX, TROPONINI in the last 168 hours.  HbA1C: Hemoglobin A1C  Date/Time Value Ref Range Status  07/10/2020 09:31 AM 5.0 4.0 - 5.6 % Final  04/14/2020 01:32 PM 5.0 4.0 - 5.6 % Final   HbA1c, POC (prediabetic range)  Date/Time Value Ref Range Status  07/10/2020 09:31 AM 5.0 (A) 5.7 - 6.4 % Final  04/14/2020 01:32 PM 5.0 (A) 5.7 - 6.4 % Final   HbA1c, POC (controlled diabetic range)  Date/Time Value Ref Range Status  07/10/2020 09:31 AM 5.0 0.0 - 7.0 % Final  04/14/2020 01:32 PM 5.0 0.0 - 7.0 % Final   HbA1c POC (<> result, manual entry)  Date/Time Value Ref Range Status  07/10/2020 09:31 AM 5.0 4.0 - 5.6 % Final  04/14/2020 01:32 PM 5.0 4.0 - 5.6 % Final    CBG: No results for input(s): GLUCAP in the last 168 hours.    CCT: n/a  Eliseo Gum MSN, AGACNP-BC [place] [id number] If no answer, [id number] 11/11/2020, 10:27 AM

## 2020-11-12 DIAGNOSIS — N179 Acute kidney failure, unspecified: Secondary | ICD-10-CM

## 2020-11-12 DIAGNOSIS — N184 Chronic kidney disease, stage 4 (severe): Secondary | ICD-10-CM

## 2020-11-12 DIAGNOSIS — D638 Anemia in other chronic diseases classified elsewhere: Secondary | ICD-10-CM

## 2020-11-12 LAB — RENAL FUNCTION PANEL
Albumin: 3 g/dL — ABNORMAL LOW (ref 3.5–5.0)
Anion gap: 14 (ref 5–15)
BUN: 58 mg/dL — ABNORMAL HIGH (ref 6–20)
CO2: 19 mmol/L — ABNORMAL LOW (ref 22–32)
Calcium: 8.5 mg/dL — ABNORMAL LOW (ref 8.9–10.3)
Chloride: 104 mmol/L (ref 98–111)
Creatinine, Ser: 6.77 mg/dL — ABNORMAL HIGH (ref 0.61–1.24)
GFR, Estimated: 10 mL/min — ABNORMAL LOW (ref >60)
Glucose, Bld: 78 mg/dL (ref 70–99)
Phosphorus: 4.4 mg/dL (ref 2.5–4.6)
Potassium: 4.3 mmol/L (ref 3.5–5.1)
Sodium: 137 mmol/L (ref 135–145)

## 2020-11-12 MED ORDER — FUROSEMIDE 40 MG PO TABS
40.0000 mg | ORAL_TABLET | Freq: Every day | ORAL | Status: DC
  Administered 2020-11-12 – 2020-11-14 (×3): 40 mg via ORAL
  Filled 2020-11-12 (×3): qty 1

## 2020-11-12 NOTE — Progress Notes (Signed)
Rounded on patient secondary to Dr. Carolin Sicks referral with hypertensive crises and elevated BUN and creatinine levels. Patient found laying in the bed and brother at bedside. Given permission by patient to speak to both patient and brother.   Patient reports that he has spoken with the nephrologist and has already had vein mapping performed, but becomes tearful at his only choices and wonders if he should have done something different. This coordinator did speak at length with patient and brother regarding the current state of his kidneys and planning for preparation should he need to start dialysis. Patient reports that he understands, but that he has not been to a Nephrologist since visiting Dr. Arty Baumgartner, so he was unaware how serious his condition is. He also was in process of getting his insurance at the time of visiting Dr. Arty Baumgartner, which he now has. He endorses that he does not have a nephrologist at this time. Encouraged patient to write down his questions for the rounding nephrologist tomorrow in regards to next steps of finding a nephrologist because he reports he wants "to trust that they are making the right decision for him".  This coordinator also spoke with the patient in regards to specific dietary and fluid changes that would become necessary, as well as scheduling, fistula placement, and medication regimen and monitoring. Patient with several questions as to when he would start if needed. The etiology is unclear. The patient would like more time to think about it and pray on it before making a final decision. This coordinator reports that I understand. Renal failure booklet and cards and information given to patient for follow-up. Will continue to follow.   Dorthey Sawyer, RN  Dialysis Nurse Coordinator [phone]

## 2020-11-12 NOTE — Progress Notes (Signed)
Pt refused cpap

## 2020-11-12 NOTE — Progress Notes (Signed)
Stanley [name] Progress Note    Assessment/ Plan:   Stanley [name] is a/an 45 y.o. male with a past medical history CKD stage IV, HTN, chronic diastolic CHF, severe LVH who present w/ poorly controlled hypertension.  AKI on CKD stage IV Likely progression of CKD due to uncontrolled hypertension.  Patient received a dose of 40 mg IV was transitioned to 80 mg IV Lasix daily. Renal ultrasound showed diffusely increased renal cortical echogenicity with trace perinephric free fluid bilaterally, nonspecific finding but most commonly seen with medical renal disease.  There was suboptimal visualization of the left kidney.  No remarkable findings in the bladder. Patient is output of 1976 mL over the last 24 hours after transitioning him to 80 mg p.o. Lasix. Creatinine slightly increased to 6.77, BUN 58, K4.3, NA 137, calcium 8.5, Phos 4.4.  Patient appears euvolemic on exam. -Vein mapping study has been ordered in case of need for dialysis -Discussed with patient that he may require dialysis if kidney function does not improve. -Decrease Lasix to 40 mg daily given exam showing patient close to euvolemia -Strict I's and O's -Daily renal function panel -No need for dialysis at this time but will continue to monitor  Hypertensive emergency Patient reports strict medication compliance with his home medications but given elevation on arrival it is difficult to determine if he was actually taking his medications. Blood pressures have ranged from 134/72-176/95 over the last 24 hours. Patient is on amlodipine, Cardura, hydralazine, Lasix and labetalol -Continue management per CCM  Anemia of CKD Patient received transfusion on arrival. Patient with completely normal iron studies.  Hemoglobin decreased to 7.9 on 2/8. No obvious signs of bleeding at this time. No need for ESA at this time.  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  Phosphorus of 3.8. PTH elevated at 105.  CHF exacerbation 2/2  chronic diastolic CHF Echocardiogram 11/09/2020 showed LVEF [id number], grade 1 diastolic dysfunction, no regional wall motion abnormalities.  Mildly elevated pulmonary artery systolic pressure.  Mildly elevated right atrial pressure.  Small pericardial effusion noted. BNP elevated on arrival at 3521.  Chest x-ray showed bilateral pleural effusions. -Strict I's and O's -Lasix 80 milligrams p.o. daily -Continue management per primary team  Subjective:   Transferred out of the ICU overnight.  Patient reports he feels well this morning.  Has questions regarding how we can improve his kidney function.  No other concerns or questions at this time.   Objective:   BP (!) 153/79 (BP Location: Right Arm)   Pulse 63   Temp 98.2 F (36.8 C)   Resp 17   Ht '5\' 11"'$  (1.803 m)   Wt 89.6 kg   SpO2 95%   BMI 27.55 kg/m   Intake/Output Summary (Last 24 hours) at 11/12/2020 [id number] Last data filed at 11/12/2020 [id number] Gross per 24 hour  Intake 864 ml  Output 2651 ml  Net -1787 ml   Weight change:   Physical Exam: Gen: Lying comfortably in bed, no acute distress CVS: Regular rate and rhythm, no murmurs appreciated Resp: Crackles in lung bases bilaterally but improved from previous exams, normal work of breathing Abd: Soft, nontender, positive bowel sounds Ext: Edema to the mid calf bilaterally, improved from previous exam  Imaging: VAS [place] UPPER EXT VEIN MAPPING (PRE-OP AVF)  Result Date: 11/10/2020 UPPER EXTREMITY VEIN MAPPING  Indications: Pre-access. Comparison Study: No previous Performing Technologist: Vonzell Schlatter RVT  Examination Guidelines: A complete evaluation includes B-mode imaging, spectral Doppler, color  Doppler, and power Doppler as needed of all accessible portions of each vessel. Bilateral testing is considered an integral part of a complete examination. Limited examinations for reoccurring indications may be performed as noted. +-----------------+-------------+----------+--------+ Right  Cephalic   Diameter (cm)Depth (cm)Findings +-----------------+-------------+----------+--------+ Shoulder             0.26        0.37            +-----------------+-------------+----------+--------+ Prox upper arm       0.25        0.29            +-----------------+-------------+----------+--------+ Mid upper arm        0.21        0.32            +-----------------+-------------+----------+--------+ Dist upper arm       0.30        0.35            +-----------------+-------------+----------+--------+ Antecubital fossa    0.32        0.37            +-----------------+-------------+----------+--------+ Prox forearm         0.27        0.42            +-----------------+-------------+----------+--------+ Mid forearm          0.22        0.23            +-----------------+-------------+----------+--------+ Dist forearm         0.27        0.21            +-----------------+-------------+----------+--------+ Wrist                0.24        0.25            +-----------------+-------------+----------+--------+ +-----------------+-------------+----------+---------------------+ Right Basilic    Diameter (cm)Depth (cm)      Findings        +-----------------+-------------+----------+---------------------+ Prox upper arm       0.43        0.68                         +-----------------+-------------+----------+---------------------+ Mid upper arm        0.57        0.58                         +-----------------+-------------+----------+---------------------+ Dist upper arm       0.60        0.53                         +-----------------+-------------+----------+---------------------+ Antecubital fossa    0.24        0.20                         +-----------------+-------------+----------+---------------------+ Prox forearm                            not visualized and IV +-----------------+-------------+----------+---------------------+ Mid  forearm          0.21        0.18                         +-----------------+-------------+----------+---------------------+ Distal forearm  0.22        0.18                         +-----------------+-------------+----------+---------------------+ Wrist                0.17        0.14                         +-----------------+-------------+----------+---------------------+ +-----------------+-------------+----------+---------+ Left Cephalic    Diameter (cm)Depth (cm)Findings  +-----------------+-------------+----------+---------+ Shoulder             0.28        0.27             +-----------------+-------------+----------+---------+ Prox upper arm       0.25        0.33             +-----------------+-------------+----------+---------+ Mid upper arm        0.30        0.31             +-----------------+-------------+----------+---------+ Dist upper arm       0.35        0.36             +-----------------+-------------+----------+---------+ Antecubital fossa    0.38        0.52   branching +-----------------+-------------+----------+---------+ Prox forearm         0.23        0.30   branching +-----------------+-------------+----------+---------+ Mid forearm          0.20        0.26             +-----------------+-------------+----------+---------+ Dist forearm         0.23        0.24             +-----------------+-------------+----------+---------+ Wrist                0.23        0.22             +-----------------+-------------+----------+---------+ +-----------------+-------------+----------+--------------+ Left Basilic     Diameter (cm)Depth (cm)   Findings    +-----------------+-------------+----------+--------------+ Prox upper arm       0.49        0.92                  +-----------------+-------------+----------+--------------+ Mid upper arm        0.51        0.93                   +-----------------+-------------+----------+--------------+ Dist upper arm       0.46        0.85                  +-----------------+-------------+----------+--------------+ Antecubital fossa    0.12                              +-----------------+-------------+----------+--------------+ Prox forearm         0.13        0.15                  +-----------------+-------------+----------+--------------+ Mid forearm          0.12        0.15                  +-----------------+-------------+----------+--------------+  Distal forearm                          not visualized +-----------------+-------------+----------+--------------+ Elbow                                   not visualized +-----------------+-------------+----------+--------------+ *See table(s) above for measurements and observations.  Diagnosing physician: Deitra Mayo MD Electronically signed by Deitra Mayo MD on 11/10/2020 at 9:03:19 PM.    Final     Labs: BMET Recent Labs  Lab 11/09/20 [id number] 11/09/20 1627 11/10/20 0327 11/11/20 0024 11/12/20 0213  NA 137 137 136 137 137  K 4.4 4.2 3.8 4.1 4.3  CL 106 106 105 106 104  CO2 16* 18* 18* 18* 19*  GLUCOSE 84 119* 90 81 78  BUN 61* 59* 59* 59* 58*  CREATININE 6.87* 7.05*  7.01* 6.55* 6.52* 6.77*  CALCIUM 8.8* 8.8* 8.2* 8.0* 8.5*  PHOS  --   --  3.8 3.5 4.4   CBC Recent Labs  Lab 11/09/20 0332 11/09/20 1627 11/10/20 0327  WBC 4.3 4.9 5.1  HGB 6.9* 8.6* 7.9*  HCT 22.1* 27.5* 23.9*  MCV 89.5 87.0 85.7  PLT 264 288 268    Medications:    . amLODipine  10 mg Oral Daily  . aspirin EC  81 mg Oral Daily  . atorvastatin  40 mg Oral Daily  . Chlorhexidine Gluconate Cloth  6 each Topical Daily  . doxazosin  16 mg Oral Daily  . furosemide  80 mg Oral Daily  . heparin  5,000 Units Subcutaneous Q8H  . hydrALAZINE  100 mg Oral Q6H  . labetalol  600 mg Oral TID  . sodium bicarbonate  1,300 mg Oral BID     Gifford Shave, MD [facility]  Family Medicine Resident, PGY2 11/12/2020, 7:37 AM

## 2020-11-12 NOTE — Progress Notes (Signed)
Patient ID: Stanley [name], male   DOB: 10-Mar-1976, 45 y.o.   MRN: [id number]  PROGRESS NOTE    Stanley [name]  [id number] DOB: 05-Nov-1975 DOA: 11/09/2020 PCP: [name], FNP   Brief Narrative:  45 year old male with history of difficult to control hypertension with prior admission for hypertensive crisis, medical noncompliance, CKD stage IV, obstructive sleep apnea, tobacco abuse, chronic diastolic CHF, anemia of chronic disease presented on 11/09/2020 with chest pressure, lower extremity edema and orthopnea and was found to have initial blood pressure of 242/126 and subsequently admitted to ICU on nicardipine drip.  On presentation, creatinine was 6.87 with a baseline creatinine in the range of 4.2-5, BNP of 3521 chest x-ray showed evolving pulmonary edema.  He was started on IV Lasix.  Nephrology was consulted.  Blood pressure has improved and he has been transferred out of ICU to [facility] service on 11/12/2020.  Assessment & Plan:   Hypertensive emergency: Improved Difficult to control hypertension Medical noncompliance -Presented with blood pressure of 242/126 and treated with nicardipine drip in ICU. - Blood pressure has improved and he has been transferred out of ICU to [facility] service on 11/12/2020. -Continue amlodipine, doxazosin, Lasix, hydralazine and labetalol  Acute kidney injury on chronic kidney disease stage IV Acute metabolic acidosis -Presented with creatinine of 6.87; baseline creatinine around 4.2-5.  Nephrology following and diuretics as per nephrology team.  Currently making urine.  Creatinine 6.77 today.  Strict input and output.  Daily weights.  Fluid restriction -Still acidotic.  Continue sodium bicarbonate as per nephrology.  Monitor  Acute and chronic diastolic heart failure -Strict input output.  Daily weights.  Fluid restriction.  Volume being managed by nephrology.  Continue Lasix, hydralazine and labetalol.  Anemia of chronic disease -Hemoglobin stable.   Monitor  OSA -Continue nightly CPAP   DVT prophylaxis: Heparin Code Status: Full Family Communication: None Disposition Plan: Status is: Inpatient  Remains inpatient appropriate because:Inpatient level of care appropriate due to severity of illness   Dispo: The patient is from: Home              Anticipated d/c is to: Home              Anticipated d/c date is: 2 days              Patient currently is not medically stable to d/c.   Difficult to place patient No  Consultants: PCCM/nephrology  Procedures: Echo showed EF of 65 to 70% with grade 1 diastolic dysfunction  Antimicrobials: None   Subjective: Patient seen and examined at bedside.  Denies current chest pain, nausea, vomiting or worsening shortness of breath.  Objective: Vitals:   11/11/20 2029 11/12/20 0001 11/12/20 0511 11/12/20 0909  BP: (!) 157/96 139/82 (!) 153/79 (!) 159/87  Pulse: 68 65 63 66  Resp: '18 18 17 20  '$ Temp: 98.7 F (37.1 C) 98.4 F (36.9 C) 98.2 F (36.8 C) 98.3 F (36.8 C)  TempSrc:    Oral  SpO2: 97% 98% 95% 98%  Weight:      Height:        Intake/Output Summary (Last 24 hours) at 11/12/2020 0931 Last data filed at 11/12/2020 0711 Gross per 24 hour  Intake 589 ml  Output 1751 ml  Net -1162 ml   Filed Weights   11/09/20 0329 11/10/20 0500 11/11/20 0500  Weight: 90.7 kg 89.5 kg 89.6 kg    Examination:  General exam: Appears calm and comfortable.  Currently on room  air Respiratory system: Bilateral decreased breath sounds at bases with scattered crackles Cardiovascular system: S1 & S2 heard, Rate controlled Gastrointestinal system: Abdomen is nondistended, soft and nontender. Normal bowel sounds heard. Extremities: No cyanosis, clubbing; trace lower extremity edema Central nervous system: Alert and oriented. No focal neurological deficits. Moving extremities Skin: No rashes, lesions or ulcers Psychiatry: Judgement and insight appear normal. Mood & affect appropriate.      Data Reviewed: I have personally reviewed following labs and imaging studies  CBC: Recent Labs  Lab 11/09/20 0332 11/09/20 1627 11/10/20 0327  WBC 4.3 4.9 5.1  HGB 6.9* 8.6* 7.9*  HCT 22.1* 27.5* 23.9*  MCV 89.5 87.0 85.7  PLT 264 288 [id number]   Basic Metabolic Panel: Recent Labs  Lab 11/09/20 0332 11/09/20 1627 11/10/20 0327 11/11/20 0024 11/12/20 0213  NA 137 137 136 137 137  K 4.4 4.2 3.8 4.1 4.3  CL 106 106 105 106 104  CO2 16* 18* 18* 18* 19*  GLUCOSE 84 119* 90 81 78  BUN 61* 59* 59* 59* 58*  CREATININE 6.87* 7.05*  7.01* 6.55* 6.52* 6.77*  CALCIUM 8.8* 8.8* 8.2* 8.0* 8.5*  MG  --  1.6*  --   --   --   PHOS  --   --  3.8 3.5 4.4   GFR: Estimated Creatinine Clearance: 15 mL/min (A) (by C-G formula based on SCr of 6.77 mg/dL (H)). Liver Function Tests: Recent Labs  Lab 11/09/20 1627 11/10/20 0327 11/11/20 0024 11/12/20 0213  AST 30  --   --   --   ALT 25  --   --   --   ALKPHOS 90  --   --   --   BILITOT 1.0  --   --   --   PROT 6.8  --   --   --   ALBUMIN 3.4* 2.9* 2.9* 3.0*   No results for input(s): LIPASE, AMYLASE in the last 168 hours. No results for input(s): AMMONIA in the last 168 hours. Coagulation Profile: No results for input(s): INR, PROTIME in the last 168 hours. Cardiac Enzymes: No results for input(s): CKTOTAL, CKMB, CKMBINDEX, TROPONINI in the last 168 hours. BNP (last 3 results) No results for input(s): PROBNP in the last 8760 hours. HbA1C: No results for input(s): HGBA1C in the last 72 hours. CBG: No results for input(s): GLUCAP in the last 168 hours. Lipid Profile: No results for input(s): CHOL, HDL, LDLCALC, TRIG, CHOLHDL, LDLDIRECT in the last 72 hours. Thyroid Function Tests: No results for input(s): TSH, T4TOTAL, FREET4, T3FREE, THYROIDAB in the last 72 hours. Anemia Panel: Recent Labs    11/10/20 0327  FERRITIN 223  TIBC 262  IRON 48   Sepsis Labs: No results for input(s): PROCALCITON, LATICACIDVEN in the last  168 hours.  Recent Results (from the past 240 hour(s))  SARS Coronavirus 2 by RT PCR (hospital order, performed in [facility] hospital lab) Nasopharyngeal Nasopharyngeal Swab     Status: None   Collection Time: 11/09/20 11:14 AM   Specimen: Nasopharyngeal Swab  Result Value Ref Range Status   SARS Coronavirus 2 NEGATIVE NEGATIVE Final    Comment: (NOTE) SARS-CoV-2 target nucleic acids are NOT DETECTED.  The SARS-CoV-2 RNA is generally detectable in upper and lower respiratory specimens during the acute phase of infection. The lowest concentration of SARS-CoV-2 viral copies this assay can detect is 250 copies / mL. A negative result does not preclude SARS-CoV-2 infection and should not be used as the sole  basis for treatment or other patient management decisions.  A negative result may occur with improper specimen collection / handling, submission of specimen other than nasopharyngeal swab, presence of viral mutation(s) within the areas targeted by this assay, and inadequate number of viral copies (<250 copies / mL). A negative result must be combined with clinical observations, patient history, and epidemiological information.  Fact Sheet for Patients:   StrictlyIdeas.no  Fact Sheet for Healthcare Providers: BankingDealers.co.za  This test is not yet approved or  cleared by the [place] FDA and has been authorized for detection and/or diagnosis of SARS-CoV-2 by FDA under an Emergency Use Authorization (EUA).  This EUA will remain in effect (meaning this test can be used) for the duration of the COVID-19 declaration under Section 564(b)(1) of the Act, 21 U.S.C. section 360bbb-3(b)(1), unless the authorization is terminated or revoked sooner.  Performed at [facility], [place] [address], [place], [place] [address]   MRSA PCR Screening     Status: None   Collection Time: 11/09/20  4:09 PM   Specimen: Nasal Mucosa;  Nasopharyngeal  Result Value Ref Range Status   MRSA by PCR NEGATIVE NEGATIVE Final    Comment:        The GeneXpert MRSA Assay (FDA approved for NASAL specimens only), is one component of a comprehensive MRSA colonization surveillance program. It is not intended to diagnose MRSA infection nor to guide or monitor treatment for MRSA infections. Performed at [facility], [place] [address], [place], [place] [address]          Radiology Studies: VAS [place] UPPER EXT VEIN MAPPING (PRE-OP AVF)  Result Date: 11/10/2020 UPPER EXTREMITY VEIN MAPPING  Indications: Pre-access. Comparison Study: No previous Performing Technologist: Vonzell Schlatter RVT  Examination Guidelines: A complete evaluation includes B-mode imaging, spectral Doppler, color Doppler, and power Doppler as needed of all accessible portions of each vessel. Bilateral testing is considered an integral part of a complete examination. Limited examinations for reoccurring indications may be performed as noted. +-----------------+-------------+----------+--------+ Right Cephalic   Diameter (cm)Depth (cm)Findings +-----------------+-------------+----------+--------+ Shoulder             0.26        0.37            +-----------------+-------------+----------+--------+ Prox upper arm       0.25        0.29            +-----------------+-------------+----------+--------+ Mid upper arm        0.21        0.32            +-----------------+-------------+----------+--------+ Dist upper arm       0.30        0.35            +-----------------+-------------+----------+--------+ Antecubital fossa    0.32        0.37            +-----------------+-------------+----------+--------+ Prox forearm         0.27        0.42            +-----------------+-------------+----------+--------+ Mid forearm          0.22        0.23            +-----------------+-------------+----------+--------+ Dist forearm         0.27         0.21            +-----------------+-------------+----------+--------+  Wrist                0.24        0.25            +-----------------+-------------+----------+--------+ +-----------------+-------------+----------+---------------------+ Right Basilic    Diameter (cm)Depth (cm)      Findings        +-----------------+-------------+----------+---------------------+ Prox upper arm       0.43        0.68                         +-----------------+-------------+----------+---------------------+ Mid upper arm        0.57        0.58                         +-----------------+-------------+----------+---------------------+ Dist upper arm       0.60        0.53                         +-----------------+-------------+----------+---------------------+ Antecubital fossa    0.24        0.20                         +-----------------+-------------+----------+---------------------+ Prox forearm                            not visualized and IV +-----------------+-------------+----------+---------------------+ Mid forearm          0.21        0.18                         +-----------------+-------------+----------+---------------------+ Distal forearm       0.22        0.18                         +-----------------+-------------+----------+---------------------+ Wrist                0.17        0.14                         +-----------------+-------------+----------+---------------------+ +-----------------+-------------+----------+---------+ Left Cephalic    Diameter (cm)Depth (cm)Findings  +-----------------+-------------+----------+---------+ Shoulder             0.28        0.27             +-----------------+-------------+----------+---------+ Prox upper arm       0.25        0.33             +-----------------+-------------+----------+---------+ Mid upper arm        0.30        0.31             +-----------------+-------------+----------+---------+  Dist upper arm       0.35        0.36             +-----------------+-------------+----------+---------+ Antecubital fossa    0.38        0.52   branching +-----------------+-------------+----------+---------+ Prox forearm         0.23        0.30   branching +-----------------+-------------+----------+---------+ Mid forearm          0.20  0.26             +-----------------+-------------+----------+---------+ Dist forearm         0.23        0.24             +-----------------+-------------+----------+---------+ Wrist                0.23        0.22             +-----------------+-------------+----------+---------+ +-----------------+-------------+----------+--------------+ Left Basilic     Diameter (cm)Depth (cm)   Findings    +-----------------+-------------+----------+--------------+ Prox upper arm       0.49        0.92                  +-----------------+-------------+----------+--------------+ Mid upper arm        0.51        0.93                  +-----------------+-------------+----------+--------------+ Dist upper arm       0.46        0.85                  +-----------------+-------------+----------+--------------+ Antecubital fossa    0.12                              +-----------------+-------------+----------+--------------+ Prox forearm         0.13        0.15                  +-----------------+-------------+----------+--------------+ Mid forearm          0.12        0.15                  +-----------------+-------------+----------+--------------+ Distal forearm                          not visualized +-----------------+-------------+----------+--------------+ Elbow                                   not visualized +-----------------+-------------+----------+--------------+ *See table(s) above for measurements and observations.  Diagnosing physician: Deitra Mayo MD Electronically signed by Deitra Mayo MD  on 11/10/2020 at 9:03:19 PM.    Final         Scheduled Meds: . amLODipine  10 mg Oral Daily  . aspirin EC  81 mg Oral Daily  . atorvastatin  40 mg Oral Daily  . Chlorhexidine Gluconate Cloth  6 each Topical Daily  . doxazosin  16 mg Oral Daily  . furosemide  40 mg Oral Daily  . heparin  5,000 Units Subcutaneous Q8H  . hydrALAZINE  100 mg Oral Q6H  . labetalol  600 mg Oral TID  . sodium bicarbonate  1,300 mg Oral BID   Continuous Infusions: . sodium chloride    . ferric gluconate (FERRLECIT/NULECIT) IV 250 mg (11/11/20 1809)          Aline August, MD [facility] 11/12/2020, 9:31 AM

## 2020-11-13 DIAGNOSIS — Z992 Dependence on renal dialysis: Secondary | ICD-10-CM

## 2020-11-13 DIAGNOSIS — N186 End stage renal disease: Secondary | ICD-10-CM

## 2020-11-13 DIAGNOSIS — Z9119 Patient's noncompliance with other medical treatment and regimen: Secondary | ICD-10-CM

## 2020-11-13 LAB — CBC WITH DIFFERENTIAL/PLATELET
Abs Immature Granulocytes: 0.08 10*3/uL — ABNORMAL HIGH (ref 0.00–0.07)
Basophils Absolute: 0.1 10*3/uL (ref 0.0–0.1)
Basophils Relative: 1 %
Eosinophils Absolute: 0.3 10*3/uL (ref 0.0–0.5)
Eosinophils Relative: 4 %
HCT: 26.7 % — ABNORMAL LOW (ref 39.0–52.0)
Hemoglobin: 8.1 g/dL — ABNORMAL LOW (ref 13.0–17.0)
Immature Granulocytes: 1 %
Lymphocytes Relative: 16 %
Lymphs Abs: 1 10*3/uL (ref 0.7–4.0)
MCH: 27.6 pg (ref 26.0–34.0)
MCHC: 30.3 g/dL (ref 30.0–36.0)
MCV: 91.1 fL (ref 80.0–100.0)
Monocytes Absolute: 0.7 10*3/uL (ref 0.1–1.0)
Monocytes Relative: 11 %
Neutro Abs: 4.2 10*3/uL (ref 1.7–7.7)
Neutrophils Relative %: 67 %
Platelets: 311 10*3/uL (ref 150–400)
RBC: 2.93 MIL/uL — ABNORMAL LOW (ref 4.22–5.81)
RDW: 20.3 % — ABNORMAL HIGH (ref 11.5–15.5)
WBC: 6.3 10*3/uL (ref 4.0–10.5)
nRBC: 0 % (ref 0.0–0.2)

## 2020-11-13 LAB — RENAL FUNCTION PANEL
Albumin: 3.3 g/dL — ABNORMAL LOW (ref 3.5–5.0)
Anion gap: 13 (ref 5–15)
BUN: 54 mg/dL — ABNORMAL HIGH (ref 6–20)
CO2: 19 mmol/L — ABNORMAL LOW (ref 22–32)
Calcium: 9 mg/dL (ref 8.9–10.3)
Chloride: 103 mmol/L (ref 98–111)
Creatinine, Ser: 6.88 mg/dL — ABNORMAL HIGH (ref 0.61–1.24)
GFR, Estimated: 9 mL/min — ABNORMAL LOW (ref >60)
Glucose, Bld: 81 mg/dL (ref 70–99)
Phosphorus: 4.7 mg/dL — ABNORMAL HIGH (ref 2.5–4.6)
Potassium: 4.4 mmol/L (ref 3.5–5.1)
Sodium: 135 mmol/L (ref 135–145)

## 2020-11-13 MED ORDER — CLONIDINE HCL 0.1 MG PO TABS
0.1000 mg | ORAL_TABLET | Freq: Two times a day (BID) | ORAL | Status: DC
  Administered 2020-11-13 – 2020-11-14 (×3): 0.1 mg via ORAL
  Filled 2020-11-13 (×3): qty 1

## 2020-11-13 NOTE — Progress Notes (Signed)
Pt continues to refuse CPAP, order DC'd per RT protocol. RT explained that he could request CPAP at anytime.

## 2020-11-13 NOTE — Plan of Care (Signed)
  Problem: Activity: Goal: Risk for activity intolerance will decrease Outcome: Progressing   

## 2020-11-13 NOTE — Progress Notes (Signed)
[facility] Progress Note    Assessment/ Plan:   Stanley [name] is a/an 45 y.o. male with a past medical history CKD stage IV, HTN, chronic diastolic CHF, severe LVH who present w/ poorly controlled hypertension.  AKI on CKD stage IV Likely progression of CKD due to uncontrolled hypertension.  Patient received a dose of 40 mg IV was transitioned to 80 mg IV Lasix daily. Renal ultrasound showed diffusely increased renal cortical echogenicity with trace perinephric free fluid bilaterally, nonspecific finding but most commonly seen with medical renal disease.  There was suboptimal visualization of the left kidney.  No remarkable findings in the bladder.  Urine output over the last 24 hours of 3000 mL.  Creatinine slightly improved to 6.88 from 6.77, BUN decreased to 54, sodium 135, potassium 4.4. -Vein mapping has been completed.  Discussed with patient that although he does not need dialysis right now we should plan for the need in the future and have him evaluated for fistula placement by vascular surgery.  Patient is agreeable to this. -Continue discussion regarding possible need for dialysis if his kidney function does not improve -Continue Lasix 40 mg daily, patient is close to euvolemic -Strict I's and O's -Daily renal function panel -No need for dialysis at this time but will continue to monitor  Hypertensive emergency Patient reports strict medication compliance with his home medications but given elevation on arrival it is difficult to determine if he was actually taking his medications.  Blood pressures have ranged from 142/88-187/107. Patient is on amlodipine, Cardura, hydralazine, Lasix and labetalol.  Patient needs better blood pressure control.  Consider addition of clonidine 0.1 mg p.o. twice daily. -Continue management per primary team  Anemia of CKD Patient received transfusion on arrival. Patient with completely normal iron studies.  Hemoglobin decreased to 7.9 on  2/8. No obvious signs of bleeding at this time. No need for ESA at this time.  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  Phosphorus of 4.7. PTH elevated at 105.  CHF exacerbation 2/2 chronic diastolic CHF Echocardiogram 11/09/2020 showed LVEF [id number], grade 1 diastolic dysfunction, no regional wall motion abnormalities.  Mildly elevated pulmonary artery systolic pressure.  Mildly elevated right atrial pressure.  Small pericardial effusion noted. BNP elevated on arrival at 3521.  Chest x-ray showed bilateral pleural effusions. -Strict I's and O's -Lasix 80 milligrams p.o. daily -Continue management per primary team  Subjective:   Patient reports that he slept well overnight.  He is agreeable to have fistula placement while in the hospital if we think that is what is best for him.  He does want to make sure his PCP is informed/updated of what has occurred.  He also wants to be sure to follow-up with Dr. Carolin Sicks at [facility] for his nephrologist.   Objective:   BP (!) 187/103 (BP Location: Right Arm)   Pulse 63   Temp 98.5 F (36.9 C) (Oral)   Resp 20   Ht '5\' 11"'$  (1.803 m)   Wt 89.6 kg   SpO2 98%   BMI 27.55 kg/m   Intake/Output Summary (Last 24 hours) at 11/13/2020 0749 Last data filed at 11/13/2020 [id number] Gross per 24 hour  Intake 1200 ml  Output 2325 ml  Net -1125 ml   Weight change:   Physical Exam: Gen: Sleeping when I enter the room, easily arousable CVS: Regular rate and rhythm, no murmurs appreciated Resp: Normal work of breathing, lungs clear to auscultation Abd: Soft, nontender, positive bowel  sounds Ext: Continued edema in lower extremities bilaterally but improving  Imaging: No results found.  Labs: BMET Recent Labs  Lab 11/09/20 0332 11/09/20 1627 11/10/20 0327 11/11/20 0024 11/12/20 0213 11/13/20 0412  NA 137 137 136 137 137 135  K 4.4 4.2 3.8 4.1 4.3 4.4  CL 106 106 105 106 104 103  CO2 16* 18* 18* 18* 19* 19*  GLUCOSE 84 119* 90 81 78 81   BUN 61* 59* 59* 59* 58* 54*  CREATININE 6.87* 7.05*  7.01* 6.55* 6.52* 6.77* 6.88*  CALCIUM 8.8* 8.8* 8.2* 8.0* 8.5* 9.0  PHOS  --   --  3.8 3.5 4.4 4.7*   CBC Recent Labs  Lab 11/09/20 0332 11/09/20 1627 11/10/20 0327 11/13/20 0412  WBC 4.3 4.9 5.1 6.3  NEUTROABS  --   --   --  4.2  HGB 6.9* 8.6* 7.9* 8.1*  HCT 22.1* 27.5* 23.9* 26.7*  MCV 89.5 87.0 85.7 91.1  PLT 264 288 268 311    Medications:    . amLODipine  10 mg Oral Daily  . aspirin EC  81 mg Oral Daily  . atorvastatin  40 mg Oral Daily  . Chlorhexidine Gluconate Cloth  6 each Topical Daily  . doxazosin  16 mg Oral Daily  . furosemide  40 mg Oral Daily  . heparin  5,000 Units Subcutaneous Q8H  . hydrALAZINE  100 mg Oral Q6H  . labetalol  600 mg Oral TID  . sodium bicarbonate  1,300 mg Oral BID     Gifford Shave, MD [facility] Family Medicine Resident, PGY2 11/13/2020, 7:49 AM

## 2020-11-13 NOTE — Progress Notes (Signed)
Patient ID: Stanley [name], male   DOB: [birth year], 45 y.o.   MRN: [id number]  PROGRESS NOTE    Stanley [name]  [id number] DOB: 28-Jul-1976 DOA: 11/09/2020 PCP: [name], FNP   Brief Narrative:  45 year old male with history of difficult to control hypertension with prior admission for hypertensive crisis, medical noncompliance, CKD stage IV, obstructive sleep apnea, tobacco abuse, chronic diastolic CHF, anemia of chronic disease presented on 11/09/2020 with chest pressure, lower extremity edema and orthopnea and was found to have initial blood pressure of 242/126 and subsequently admitted to ICU on nicardipine drip.  On presentation, creatinine was 6.87 with a baseline creatinine in the range of 4.2-5, BNP of 3521 chest x-ray showed evolving pulmonary edema.  He was started on IV Lasix.  Nephrology was consulted.  Blood pressure has improved and he has been transferred out of ICU to [facility] service on 11/12/2020.  Assessment & Plan:   Hypertensive emergency: Improved Difficult to control hypertension Medical noncompliance -Presented with blood pressure of 242/126 and treated with nicardipine drip in ICU. - Blood pressure has improved but still intermittently elevated and he has been transferred out of ICU to [facility] service on 11/12/2020. -Continue amlodipine, doxazosin, Lasix, hydralazine and labetalol  Acute kidney injury on chronic kidney disease stage IV Acute metabolic acidosis -Presented with creatinine of 6.87; baseline creatinine around 4.2-5.  Nephrology following and diuretics as per nephrology team.  Currently making urine.  Creatinine 6.88 today.  Strict input and output.  Daily weights.  Fluid restriction -Still acidotic with bicarb of 19 today.  Continue sodium bicarbonate as per nephrology.  Monitor  Acute on chronic diastolic heart failure -Strict input output.  Daily weights.  Fluid restriction.  Volume being managed by nephrology.  Continue Lasix, hydralazine and  labetalol.  Anemia of chronic disease -Hemoglobin stable.  Monitor  OSA -Continue nightly CPAP   DVT prophylaxis: Heparin Code Status: Full Family Communication: None Disposition Plan: Status is: Inpatient  Remains inpatient appropriate because:Inpatient level of care appropriate due to severity of illness   Dispo: The patient is from: Home              Anticipated d/c is to: Home              Anticipated d/c date is: 2 days              Patient currently is not medically stable to d/c.   Difficult to place patient No  Consultants: PCCM/nephrology  Procedures: Echo showed EF of 65 to 70% with grade 1 diastolic dysfunction  Antimicrobials: None   Subjective: Patient seen and examined at bedside.  No fever, vomiting, chest pain or worsening shortness of breath reported. Objective: Vitals:   11/12/20 1638 11/12/20 2150 11/12/20 2336 11/13/20 0422  BP: (!) 165/97 (!) 182/91 (!) 153/81 (!) 187/103  Pulse: (!) 58 69 65 63  Resp: '18 17  20  '$ Temp: 99.4 F (37.4 C) 98.3 F (36.8 C)  98.5 F (36.9 C)  TempSrc: Oral Oral  Oral  SpO2: 97% 99%  98%  Weight:      Height:        Intake/Output Summary (Last 24 hours) at 11/13/2020 0717 Last data filed at 11/13/2020 [id number] Gross per 24 hour  Intake 1200 ml  Output 2325 ml  Net -1125 ml   Filed Weights   11/09/20 0329 11/10/20 0500 11/11/20 0500  Weight: 90.7 kg 89.5 kg 89.6 kg    Examination:  General exam: On  room air.  No acute distress. Respiratory system: Decreased breath sounds at bases bilaterally with some crackles Cardiovascular system: Rate controlled, S1-S2 heard  gastrointestinal system: Abdomen is nondistended, soft and has no tenderness.  Bowel sounds are heard  extremities: Lower extremity edema present.  No clubbing Central nervous system: Awake and alert.  No focal neurological deficits.  Moves extremities Skin: No obvious lesions/ecchymosis Psychiatry: Flat affect    Data Reviewed: I have  personally reviewed following labs and imaging studies  CBC: Recent Labs  Lab 11/09/20 0332 11/09/20 1627 11/10/20 0327 11/13/20 0412  WBC 4.3 4.9 5.1 6.3  NEUTROABS  --   --   --  4.2  HGB 6.9* 8.6* 7.9* 8.1*  HCT 22.1* 27.5* 23.9* 26.7*  MCV 89.5 87.0 85.7 91.1  PLT 264 288 268 [id number]   Basic Metabolic Panel: Recent Labs  Lab 11/09/20 1627 11/10/20 0327 11/11/20 0024 11/12/20 0213 11/13/20 0412  NA 137 136 137 137 135  K 4.2 3.8 4.1 4.3 4.4  CL 106 105 106 104 103  CO2 18* 18* 18* 19* 19*  GLUCOSE 119* 90 81 78 81  BUN 59* 59* 59* 58* 54*  CREATININE 7.05*  7.01* 6.55* 6.52* 6.77* 6.88*  CALCIUM 8.8* 8.2* 8.0* 8.5* 9.0  MG 1.6*  --   --   --   --   PHOS  --  3.8 3.5 4.4 4.7*   GFR: Estimated Creatinine Clearance: 14.7 mL/min (A) (by C-G formula based on SCr of 6.88 mg/dL (H)). Liver Function Tests: Recent Labs  Lab 11/09/20 1627 11/10/20 0327 11/11/20 0024 11/12/20 0213 11/13/20 0412  AST 30  --   --   --   --   ALT 25  --   --   --   --   ALKPHOS 90  --   --   --   --   BILITOT 1.0  --   --   --   --   PROT 6.8  --   --   --   --   ALBUMIN 3.4* 2.9* 2.9* 3.0* 3.3*   No results for input(s): LIPASE, AMYLASE in the last 168 hours. No results for input(s): AMMONIA in the last 168 hours. Coagulation Profile: No results for input(s): INR, PROTIME in the last 168 hours. Cardiac Enzymes: No results for input(s): CKTOTAL, CKMB, CKMBINDEX, TROPONINI in the last 168 hours. BNP (last 3 results) No results for input(s): PROBNP in the last 8760 hours. HbA1C: No results for input(s): HGBA1C in the last 72 hours. CBG: No results for input(s): GLUCAP in the last 168 hours. Lipid Profile: No results for input(s): CHOL, HDL, LDLCALC, TRIG, CHOLHDL, LDLDIRECT in the last 72 hours. Thyroid Function Tests: No results for input(s): TSH, T4TOTAL, FREET4, T3FREE, THYROIDAB in the last 72 hours. Anemia Panel: No results for input(s): VITAMINB12, FOLATE, FERRITIN, TIBC,  IRON, RETICCTPCT in the last 72 hours. Sepsis Labs: No results for input(s): PROCALCITON, LATICACIDVEN in the last 168 hours.  Recent Results (from the past 240 hour(s))  SARS Coronavirus 2 by RT PCR (hospital order, performed in [facility] hospital lab) Nasopharyngeal Nasopharyngeal Swab     Status: None   Collection Time: 11/09/20 11:14 AM   Specimen: Nasopharyngeal Swab  Result Value Ref Range Status   SARS Coronavirus 2 NEGATIVE NEGATIVE Final    Comment: (NOTE) SARS-CoV-2 target nucleic acids are NOT DETECTED.  The SARS-CoV-2 RNA is generally detectable in upper and lower respiratory specimens during the acute phase of infection.  The lowest concentration of SARS-CoV-2 viral copies this assay can detect is 250 copies / mL. A negative result does not preclude SARS-CoV-2 infection and should not be used as the sole basis for treatment or other patient management decisions.  A negative result may occur with improper specimen collection / handling, submission of specimen other than nasopharyngeal swab, presence of viral mutation(s) within the areas targeted by this assay, and inadequate number of viral copies (<250 copies / mL). A negative result must be combined with clinical observations, patient history, and epidemiological information.  Fact Sheet for Patients:   StrictlyIdeas.no  Fact Sheet for Healthcare Providers: BankingDealers.co.za  This test is not yet approved or  cleared by the [place] FDA and has been authorized for detection and/or diagnosis of SARS-CoV-2 by FDA under an Emergency Use Authorization (EUA).  This EUA will remain in effect (meaning this test can be used) for the duration of the COVID-19 declaration under Section 564(b)(1) of the Act, 21 U.S.C. section 360bbb-3(b)(1), unless the authorization is terminated or revoked sooner.  Performed at [facility], [place] [address] [place],  [place] [address]   MRSA PCR Screening     Status: None   Collection Time: 11/09/20  4:09 PM   Specimen: Nasal Mucosa; Nasopharyngeal  Result Value Ref Range Status   MRSA by PCR NEGATIVE NEGATIVE Final    Comment:        The GeneXpert MRSA Assay (FDA approved for NASAL specimens only), is one component of a comprehensive MRSA colonization surveillance program. It is not intended to diagnose MRSA infection nor to guide or monitor treatment for MRSA infections. Performed at [facility], [place] [address] [place], [place] [address]          Radiology Studies: No results found.      Scheduled Meds: . amLODipine  10 mg Oral Daily  . aspirin EC  81 mg Oral Daily  . atorvastatin  40 mg Oral Daily  . Chlorhexidine Gluconate Cloth  6 each Topical Daily  . doxazosin  16 mg Oral Daily  . furosemide  40 mg Oral Daily  . heparin  5,000 Units Subcutaneous Q8H  . hydrALAZINE  100 mg Oral Q6H  . labetalol  600 mg Oral TID  . sodium bicarbonate  1,300 mg Oral BID   Continuous Infusions: . sodium chloride            Aline August, MD [facility] 11/13/2020, 7:17 AM

## 2020-11-14 MED ORDER — CLONIDINE HCL 0.1 MG PO TABS: 0.1000 mg | ORAL_TABLET | Freq: Two times a day (BID) | ORAL | 0 refills | Status: DC

## 2020-11-14 MED ORDER — FUROSEMIDE 20 MG PO TABS: 40.0000 mg | ORAL_TABLET | Freq: Every day | ORAL | 0 refills | Status: DC

## 2020-11-14 MED ORDER — SODIUM BICARBONATE 650 MG PO TABS: 1300.0000 mg | ORAL_TABLET | Freq: Two times a day (BID) | ORAL | 0 refills | Status: AC

## 2020-11-14 NOTE — Plan of Care (Signed)
Patient discharged to go home self care.  

## 2020-11-14 NOTE — Discharge Summary (Signed)
Physician Discharge Summary  Stanley [name] [id number] DOB: 09-23-1976 DOA: 11/09/2020  PCP: [name], FNP  Admit date: 11/09/2020 Discharge date: 11/14/2020  Admitted From: Home Disposition: Home  Recommendations for Outpatient Follow-up:  1. Follow up with PCP in 1 week with repeat CBC/BMP 2. Outpatient follow-up with nephrology in 1 week 3. Comply with medications and follow-up 4. Follow up in ED if symptoms worsen or new appear   Home Health: No Equipment/Devices: None  Discharge Condition: Stable CODE STATUS: Full Diet recommendation: Heart healthy/fluid restriction of 1200 cc a day  Brief/Interim Summary: 45 year old male with history of difficult to control hypertension with prior admission for hypertensive crisis, medical noncompliance, CKD stage IV, obstructive sleep apnea, tobacco abuse, chronic diastolic CHF, anemia of chronic disease presented on 11/09/2020 with chest pressure, lower extremity edema and orthopnea and was found to have initial blood pressure of 242/126 and subsequently admitted to ICU on nicardipine drip.  On presentation, creatinine was 6.87 with a baseline creatinine in the range of 4.2-5, BNP of 3521 chest x-ray showed evolving pulmonary edema.  He was started on IV Lasix.  Nephrology was consulted.  Blood pressure has improved and he has been transferred out of ICU to [facility] service on 11/12/2020. Patient was offered AV fistula placement by nephrology but he refused. Nephrology has cleared the patient for discharge with outpatient follow-up with nephrology.  Discharge patient home today with outpatient follow-up PCP and nephrology  Discharge Diagnoses:   Hypertensive emergency: Improved Difficult to control hypertension Medical noncompliance -Presented with blood pressure of 242/126 and treated with nicardipine drip in ICU. - Blood pressure has improved but still intermittently elevated and he has been transferred out of ICU to [facility] service on  11/12/2020. -Currently on amlodipine, doxazosin, Lasix, hydralazine and labetalol.  Clonidine was added by nephrology on 11/13/2020.  All these medications will be continued upon discharge.  Nephrology has cleared the patient for discharge with outpatient follow-up with nephrology.  Blood pressure still on the higher side and needs to be closely followed up as an outpatient.  Explained the importance of compliance with medications and follow-up  Acute kidney injury on chronic kidney disease stage IV Acute metabolic acidosis -Presented with creatinine of 6.87; baseline creatinine around 4.2-5.  Nephrology following and diuretics as per nephrology team.  Currently making urine.  Creatinine 6.88  on 11/13/2020.   Continue salt and fluid restriction -Continue sodium bicarbonate as per nephrology.   -Patient was offered AV fistula placement by nephrology but he refused. -Nephrology has cleared the patient for discharge with outpatient follow-up with nephrology.  Acute on chronic diastolic heart failure - Volume being managed by nephrology.  Continue Lasix, hydralazine and labetalol.  Outpatient follow-up with cardiology  Anemia of chronic disease -Hemoglobin stable.  Outpatient follow-up  [facility] -Outpatient follow-up with PCP.  Refusing CPAP at night for the last couple of nights. Discharge Instructions  Discharge Instructions    Diet - low sodium heart healthy   Complete by: As directed    Fluid restriction of upto 1242m per day   Increase activity slowly   Complete by: As directed      Allergies as of 11/14/2020      Reactions   Lactose Intolerance (gi) Nausea And Vomiting   Nitroglycerin Anxiety, Other (See Comments)   Pain and feels like he is on fire   Other Swelling   All beans   Isosorbide Itching   headaches      Medication List  STOP taking these medications   ibuprofen 800 MG tablet Commonly known as: ADVIL   traZODone 100 MG tablet Commonly known as: DESYREL      TAKE these medications   acetaminophen 500 MG tablet Commonly known as: TYLENOL Take 1,000 mg by mouth as needed for mild pain.   albuterol 108 (90 Base) MCG/ACT inhaler Commonly known as: ProAir HFA Inhale 1-2 puffs into the lungs every 6 (six) hours as needed for wheezing or shortness of breath. What changed: when to take this   amLODipine 10 MG tablet Commonly known as: NORVASC Take 1 tablet (10 mg total) by mouth daily.   aspirin EC 81 MG tablet Take 81 mg by mouth daily.   atorvastatin 40 MG tablet Commonly known as: LIPITOR Take 1 tablet (40 mg total) by mouth daily. Please keep upcoming appt in June with Dr. Marlou Porch before anymore refills. Thank you   cloNIDine 0.1 MG tablet Commonly known as: CATAPRES Take 1 tablet (0.1 mg total) by mouth 2 (two) times daily.   doxazosin 8 MG tablet Commonly known as: CARDURA Take 2 tablets (16 mg total) by mouth daily.   folic acid [id number] MCG tablet Commonly known as: FOLVITE Take 0.5 tablets (400 mcg total) by mouth daily.   furosemide 20 MG tablet Commonly known as: LASIX Take 2 tablets (40 mg total) by mouth daily. What changed:   how much to take  when to take this  reasons to take this   hydrALAZINE 100 MG tablet Commonly known as: APRESOLINE Take 1 tablet (100 mg total) by mouth 3 (three) times daily.   labetalol 200 MG tablet Commonly known as: NORMODYNE Take 3 tablets (600 mg total) by mouth 3 (three) times daily.   multivitamin with minerals Tabs tablet Take 1 tablet by mouth daily.   ondansetron 4 MG disintegrating tablet Commonly known as: Zofran ODT Take 1 tablet (4 mg total) by mouth every 8 (eight) hours as needed for nausea or vomiting.   PSYLLIUM PO Take 1 tablet by mouth daily.   sodium bicarbonate 650 MG tablet Take 2 tablets (1,300 mg total) by mouth 2 (two) times daily.       Follow-up Information    [name], FNP. Schedule an appointment as soon as possible for a visit in 1  week(s).   Specialty: Family Medicine Why: with repeat cbc/bmp Contact information: [place] [address] [phone]        Jerline Pain, MD .   Specialty: Cardiology Contact information: [phone] [address] [place] [address] [phone]        [place], [place]. Schedule an appointment as soon as possible for a visit in 1 week(s).   Contact information: [place] [address] [phone]              Allergies  Allergen Reactions  . Lactose Intolerance (Gi) Nausea And Vomiting  . Nitroglycerin Anxiety and Other (See Comments)    Pain and feels like he is on fire  . Other Swelling    All beans  . Isosorbide Itching    headaches    Consultations:  Nephrology/PCCM   Procedures/Studies: DG Chest 2 View  Result Date: 11/09/2020 CLINICAL DATA:  Hypertension EXAM: CHEST - 2 VIEW COMPARISON:  02/10/2018 FINDINGS: Cardiomegaly. Interstitial prominence in the lower lobes. Suspect small bilateral effusions. No acute bony abnormality or pneumothorax. IMPRESSION: Interstitial prominence most notable in the lower lobes bilaterally with small effusions. Cannot exclude  infection or edema. Electronically Signed   By: Rolm Baptise M.D.   On: 11/09/2020 03:47   CT HEAD WO CONTRAST  Result Date: 11/09/2020 CLINICAL DATA:  Altered mental status, blurred vision for several days. EXAM: CT HEAD WITHOUT CONTRAST TECHNIQUE: Contiguous axial images were obtained from the base of the skull through the vertex without intravenous contrast. COMPARISON:  Head CT Feb 10, 2018 and brain MRI Feb 10, 2018 FINDINGS: Brain: No evidence of acute infarction, hemorrhage, hydrocephalus, extra-axial collection or mass lesion/mass effect. Similar moderate burden of small vessel ischemic white matter disease. Vascular: No hyperdense vessel or unexpected calcification. Skull: Normal. Negative for fracture or focal lesion. Sinuses/Orbits: No acute finding. Other:  None IMPRESSION: 1. No acute intracranial pathology. 2. Similar moderate burden of small vessel ischemic white matter disease. Electronically Signed   By: Dahlia Bailiff MD   On: 11/09/2020 21:54   US RENAL  Result Date: 11/09/2020 CLINICAL DATA:  Acute kidney injury, hypertensive, CKD stage 4 EXAM: RENAL / URINARY TRACT ULTRASOUND COMPLETE COMPARISON:  Ultrasound 09/30/2016 FINDINGS: Right Kidney: Renal measurements: 9.5 x 4.0 x 4.7 cm = volume: 94.2 mL. Diffusely increased renal cortical echogenicity. Trace perinephric free fluid. No visible renal mass, shadowing calculus or hydronephrosis is seen. Left Kidney: Renal measurements: 9 x 4.5 x 4.6 cm = volume: 99.5 mL. Suboptimal visualization of the left kidney due to extensive bowel gas and rib shadowing. Trace perinephric free fluid is seen. Diffusely increased renal cortical echogenicity. No visible renal mass, shadowing calculus or hydronephrosis within discernible portions of the left kidney. Bladder: Appears normal for degree of bladder distention. Bilateral bladder jets are identified. Other: None. IMPRESSION: 1. Diffusely increased renal cortical echogenicity with trace perinephric free fluid bilaterally, nonspecific finding but most commonly seen with medical renal disease. Suboptimal visualization of the left kidney. 2. Bladder is unremarkable. Electronically Signed   By: Lovena Le M.D.   On: 11/09/2020 21:53   ECHOCARDIOGRAM COMPLETE  Result Date: 11/09/2020    ECHOCARDIOGRAM REPORT   Patient Name:   CLEMENS POORE Hirschmann Date of Exam: 11/09/2020 Medical Rec #:  [id number]   Height:       71.0 in Accession #:    [id number]  Weight:       200.0 lb Date of Birth:  1975-11-14  BSA:          2.109 m Patient Age:    31 years    BP:           180/95 mmHg Patient Gender: M           HR:           96 bpm. Exam Location:  Inpatient Procedure: 2D Echo, Cardiac Doppler and Color Doppler Indications:    Acutre respiratory distress R06.03  History:        Patient has  prior history of Echocardiogram examinations, most                 recent 08/24/2019. Stroke; Risk Factors:Hypertension,                 Dyslipidemia and Current Smoker.  Sonographer:    Vickie Epley RDCS Referring Phys: [place]  1. Left ventricular ejection fraction, by estimation, is 65 to 70%. The left ventricle has normal function. The left ventricle has no regional wall motion abnormalities. There is severe left ventricular hypertrophy. Left ventricular diastolic parameters  are consistent with Grade I diastolic dysfunction (impaired relaxation).  2. Right  ventricular systolic function is normal. The right ventricular size is normal. There is mildly elevated pulmonary artery systolic pressure. The estimated right ventricular systolic pressure is [id number] mmHg.  3. Left atrial size was severely dilated.  4. Right atrial size was mildly dilated.  5. The mitral valve is normal in structure. Trivial mitral valve regurgitation. No evidence of mitral stenosis.  6. The aortic valve is tricuspid. Aortic valve regurgitation is mild. No aortic stenosis is present.  7. Aortic dilatation noted. There is mild dilatation of the ascending aorta, measuring 38 mm.  8. The inferior vena cava is dilated in size with >50% respiratory variability, suggesting right atrial pressure of 8 mmHg.  9. A small pericardial effusion is present. FINDINGS  Left Ventricle: Left ventricular ejection fraction, by estimation, is 65 to 70%. The left ventricle has normal function. The left ventricle has no regional wall motion abnormalities. The left ventricular internal cavity size was normal in size. There is  severe left ventricular hypertrophy. Left ventricular diastolic parameters are consistent with Grade I diastolic dysfunction (impaired relaxation). Right Ventricle: The right ventricular size is normal. No increase in right ventricular wall thickness. Right ventricular systolic function is normal. There is mildly elevated  pulmonary artery systolic pressure. The tricuspid regurgitant velocity is 2.99  m/s, and with an assumed right atrial pressure of 8 mmHg, the estimated right ventricular systolic pressure is [id number] mmHg. Left Atrium: Left atrial size was severely dilated. Right Atrium: Right atrial size was mildly dilated. Pericardium: A small pericardial effusion is present. Mitral Valve: The mitral valve is normal in structure. Trivial mitral valve regurgitation. No evidence of mitral valve stenosis. Tricuspid Valve: The tricuspid valve is normal in structure. Tricuspid valve regurgitation is mild. Aortic Valve: The aortic valve is tricuspid. Aortic valve regurgitation is mild. No aortic stenosis is present. Pulmonic Valve: The pulmonic valve was normal in structure. Pulmonic valve regurgitation is not visualized. Aorta: The aortic root is normal in size and structure and aortic dilatation noted. There is mild dilatation of the ascending aorta, measuring 38 mm. Venous: The inferior vena cava is dilated in size with greater than 50% respiratory variability, suggesting right atrial pressure of 8 mmHg. IAS/Shunts: No atrial level shunt detected by color flow Doppler.  LEFT VENTRICLE PLAX 2D LVIDd:         5.80 cm  Diastology LV PW:         1.50 cm  LV e' medial:    5.66 cm/s LV IVS:        1.40 cm  LV E/e' medial:  13.2 LVOT diam:     2.40 cm  LV e' lateral:   6.20 cm/s LV SV:         110      LV E/e' lateral: 12.0 LV SV Index:   52 LVOT Area:     4.52 cm  RIGHT VENTRICLE RV S prime:     17.50 cm/s TAPSE (M-mode): 3.0 cm LEFT ATRIUM            Index       RIGHT ATRIUM           Index LA diam:      3.90 cm  1.85 cm/m  RA Area:     22.80 cm LA Vol (A2C): 84.3 ml  39.98 ml/m RA Volume:   78.20 ml  37.09 ml/m LA Vol (A4C): 109.0 ml 51.69 ml/m  AORTIC VALVE LVOT Vmax:   146.00 cm/s LVOT Vmean:  94.900 cm/s LVOT  VTI:    0.243 m  AORTA Ao Root diam: 3.80 cm Ao Asc diam:  3.80 cm MITRAL VALVE               TRICUSPID VALVE MV Area (PHT):  4.06 cm    TR Peak grad:   35.8 mmHg MV Decel Time: 187 msec    TR Vmax:        299.00 cm/s MV E velocity: 74.60 cm/s MV A velocity: 87.00 cm/s  SHUNTS MV E/A ratio:  0.86        Systemic VTI:  0.24 m                            Systemic Diam: 2.40 cm Loralie Champagne MD Electronically signed by Loralie Champagne MD Signature Date/Time: 11/09/2020/5:14:46 PM    Final    VAS [place] UPPER EXT VEIN MAPPING (PRE-OP AVF)  Result Date: 11/10/2020 UPPER EXTREMITY VEIN MAPPING  Indications: Pre-access. Comparison Study: No previous Performing Technologist: Vonzell Schlatter RVT  Examination Guidelines: A complete evaluation includes B-mode imaging, spectral Doppler, color Doppler, and power Doppler as needed of all accessible portions of each vessel. Bilateral testing is considered an integral part of a complete examination. Limited examinations for reoccurring indications may be performed as noted. +-----------------+-------------+----------+--------+ Right Cephalic   Diameter (cm)Depth (cm)Findings +-----------------+-------------+----------+--------+ Shoulder             0.26        0.37            +-----------------+-------------+----------+--------+ Prox upper arm       0.25        0.29            +-----------------+-------------+----------+--------+ Mid upper arm        0.21        0.32            +-----------------+-------------+----------+--------+ Dist upper arm       0.30        0.35            +-----------------+-------------+----------+--------+ Antecubital fossa    0.32        0.37            +-----------------+-------------+----------+--------+ Prox forearm         0.27        0.42            +-----------------+-------------+----------+--------+ Mid forearm          0.22        0.23            +-----------------+-------------+----------+--------+ Dist forearm         0.27        0.21            +-----------------+-------------+----------+--------+ Wrist                0.24         0.25            +-----------------+-------------+----------+--------+ +-----------------+-------------+----------+---------------------+ Right Basilic    Diameter (cm)Depth (cm)      Findings        +-----------------+-------------+----------+---------------------+ Prox upper arm       0.43        0.68                         +-----------------+-------------+----------+---------------------+ Mid upper arm        0.57  0.58                         +-----------------+-------------+----------+---------------------+ Dist upper arm       0.60        0.53                         +-----------------+-------------+----------+---------------------+ Antecubital fossa    0.24        0.20                         +-----------------+-------------+----------+---------------------+ Prox forearm                            not visualized and IV +-----------------+-------------+----------+---------------------+ Mid forearm          0.21        0.18                         +-----------------+-------------+----------+---------------------+ Distal forearm       0.22        0.18                         +-----------------+-------------+----------+---------------------+ Wrist                0.17        0.14                         +-----------------+-------------+----------+---------------------+ +-----------------+-------------+----------+---------+ Left Cephalic    Diameter (cm)Depth (cm)Findings  +-----------------+-------------+----------+---------+ Shoulder             0.28        0.27             +-----------------+-------------+----------+---------+ Prox upper arm       0.25        0.33             +-----------------+-------------+----------+---------+ Mid upper arm        0.30        0.31             +-----------------+-------------+----------+---------+ Dist upper arm       0.35        0.36              +-----------------+-------------+----------+---------+ Antecubital fossa    0.38        0.52   branching +-----------------+-------------+----------+---------+ Prox forearm         0.23        0.30   branching +-----------------+-------------+----------+---------+ Mid forearm          0.20        0.26             +-----------------+-------------+----------+---------+ Dist forearm         0.23        0.24             +-----------------+-------------+----------+---------+ Wrist                0.23        0.22             +-----------------+-------------+----------+---------+ +-----------------+-------------+----------+--------------+ Left Basilic     Diameter (cm)Depth (cm)   Findings    +-----------------+-------------+----------+--------------+ Prox upper arm       0.49        0.92                  +-----------------+-------------+----------+--------------+  Mid upper arm        0.51        0.93                  +-----------------+-------------+----------+--------------+ Dist upper arm       0.46        0.85                  +-----------------+-------------+----------+--------------+ Antecubital fossa    0.12                              +-----------------+-------------+----------+--------------+ Prox forearm         0.13        0.15                  +-----------------+-------------+----------+--------------+ Mid forearm          0.12        0.15                  +-----------------+-------------+----------+--------------+ Distal forearm                          not visualized +-----------------+-------------+----------+--------------+ Elbow                                   not visualized +-----------------+-------------+----------+--------------+ *See table(s) above for measurements and observations.  Diagnosing physician: Deitra Mayo MD Electronically signed by Deitra Mayo MD on 11/10/2020 at 9:03:19 PM.    Final         Subjective: Patient seen and examined at bedside.  Wants to go home today.  Denies worsening shortness of breath, nausea, vomiting or chest pain.  Discharge Exam: Vitals:   11/13/20 2120 11/14/20 0455  BP: (!) 166/87 (!) 180/99  Pulse: 68 70  Resp: 16 18  Temp: 99.4 F (37.4 C) 99.8 F (37.7 C)  SpO2: 95% 94%    General: Pt is sleepy, wakes up slightly, answers questions appropriately.  Looks chronically ill. Cardiovascular: rate controlled, S1/S2 + Respiratory: bilateral decreased breath sounds at bases with some scattered crackles Abdominal: Soft, NT, ND, bowel sounds + Extremities: Lower extremity edema present; no cyanosis    The results of significant diagnostics from this hospitalization (including imaging, microbiology, ancillary and laboratory) are listed below for reference.     Microbiology: Recent Results (from the past 240 hour(s))  SARS Coronavirus 2 by RT PCR (hospital order, performed in [facility] hospital lab) Nasopharyngeal Nasopharyngeal Swab     Status: None   Collection Time: 11/09/20 11:14 AM   Specimen: Nasopharyngeal Swab  Result Value Ref Range Status   SARS Coronavirus 2 NEGATIVE NEGATIVE Final    Comment: (NOTE) SARS-CoV-2 target nucleic acids are NOT DETECTED.  The SARS-CoV-2 RNA is generally detectable in upper and lower respiratory specimens during the acute phase of infection. The lowest concentration of SARS-CoV-2 viral copies this assay can detect is 250 copies / mL. A negative result does not preclude SARS-CoV-2 infection and should not be used as the sole basis for treatment or other patient management decisions.  A negative result may occur with improper specimen collection / handling, submission of specimen other than nasopharyngeal swab, presence of viral mutation(s) within the areas targeted by this assay, and inadequate number of viral copies (<250 copies / mL). A negative result must be combined with  clinical observations, patient history, and epidemiological information.  Fact Sheet for Patients:   StrictlyIdeas.no  Fact Sheet for Healthcare Providers: BankingDealers.co.za  This test is not yet approved or  cleared by the [place] FDA and has been authorized for detection and/or diagnosis of SARS-CoV-2 by FDA under an Emergency Use Authorization (EUA).  This EUA will remain in effect (meaning this test can be used) for the duration of the COVID-19 declaration under Section 564(b)(1) of the Act, 21 U.S.C. section 360bbb-3(b)(1), unless the authorization is terminated or revoked sooner.  Performed at [facility], [place] [address] [place], [place] [address]   MRSA PCR Screening     Status: None   Collection Time: 11/09/20  4:09 PM   Specimen: Nasal Mucosa; Nasopharyngeal  Result Value Ref Range Status   MRSA by PCR NEGATIVE NEGATIVE Final    Comment:        The GeneXpert MRSA Assay (FDA approved for NASAL specimens only), is one component of a comprehensive MRSA colonization surveillance program. It is not intended to diagnose MRSA infection nor to guide or monitor treatment for MRSA infections. Performed at [facility], [place] [address], [place], [place] [address]      Labs: BNP (last 3 results) Recent Labs    11/09/20 0333  BNP 123XX123*   Basic Metabolic Panel: Recent Labs  Lab 11/09/20 1627 11/10/20 0327 11/11/20 0024 11/12/20 0213 11/13/20 0412  NA 137 136 137 137 135  K 4.2 3.8 4.1 4.3 4.4  CL 106 105 106 104 103  CO2 18* 18* 18* 19* 19*  GLUCOSE 119* 90 81 78 81  BUN 59* 59* 59* 58* 54*  CREATININE 7.05*  7.01* 6.55* 6.52* 6.77* 6.88*  CALCIUM 8.8* 8.2* 8.0* 8.5* 9.0  MG 1.6*  --   --   --   --   PHOS  --  3.8 3.5 4.4 4.7*   Liver Function Tests: Recent Labs  Lab 11/09/20 1627 11/10/20 0327 11/11/20 0024 11/12/20 0213 11/13/20 0412  AST 30  --   --   --   --   ALT 25  --    --   --   --   ALKPHOS 90  --   --   --   --   BILITOT 1.0  --   --   --   --   PROT 6.8  --   --   --   --   ALBUMIN 3.4* 2.9* 2.9* 3.0* 3.3*   No results for input(s): LIPASE, AMYLASE in the last 168 hours. No results for input(s): AMMONIA in the last 168 hours. CBC: Recent Labs  Lab 11/09/20 0332 11/09/20 1627 11/10/20 0327 11/13/20 0412  WBC 4.3 4.9 5.1 6.3  NEUTROABS  --   --   --  4.2  HGB 6.9* 8.6* 7.9* 8.1*  HCT 22.1* 27.5* 23.9* 26.7*  MCV 89.5 87.0 85.7 91.1  PLT 264 288 268 311   Cardiac Enzymes: No results for input(s): CKTOTAL, CKMB, CKMBINDEX, TROPONINI in the last 168 hours. BNP: Invalid input(s): POCBNP CBG: No results for input(s): GLUCAP in the last 168 hours. D-Dimer No results for input(s): DDIMER in the last 72 hours. Hgb A1c No results for input(s): HGBA1C in the last 72 hours. Lipid Profile No results for input(s): CHOL, HDL, LDLCALC, TRIG, CHOLHDL, LDLDIRECT in the last 72 hours. Thyroid function studies No results for input(s): TSH, T4TOTAL, T3FREE, THYROIDAB in the last 72 hours.  Invalid input(s): FREET3  Anemia work up No results for input(s): VITAMINB12, FOLATE, FERRITIN, TIBC, IRON, RETICCTPCT in the last 72 hours. Urinalysis    Component Value Date/Time   COLORURINE STRAW (A) 11/09/2020 1221   APPEARANCEUR CLEAR 11/09/2020 1221   LABSPEC 1.006 11/09/2020 1221   PHURINE 6.0 11/09/2020 1221   GLUCOSEU NEGATIVE 11/09/2020 1221   HGBUR NEGATIVE 11/09/2020 1221   BILIRUBINUR NEGATIVE 11/09/2020 1221   BILIRUBINUR neg 07/10/2020 0955   KETONESUR NEGATIVE 11/09/2020 1221   PROTEINUR 100 (A) 11/09/2020 1221   UROBILINOGEN 0.2 07/10/2020 0955   UROBILINOGEN 0.2 11/24/2017 1509   NITRITE NEGATIVE 11/09/2020 1221   LEUKOCYTESUR NEGATIVE 11/09/2020 1221   Sepsis Labs Invalid input(s): PROCALCITONIN,  WBC,  LACTICIDVEN Microbiology Recent Results (from the past 240 hour(s))  SARS Coronavirus 2 by RT PCR (hospital order, performed in [place] hospital lab) Nasopharyngeal Nasopharyngeal Swab     Status: None   Collection Time: 11/09/20 11:14 AM   Specimen: Nasopharyngeal Swab  Result Value Ref Range Status   SARS Coronavirus 2 NEGATIVE NEGATIVE Final    Comment: (NOTE) SARS-CoV-2 target nucleic acids are NOT DETECTED.  The SARS-CoV-2 RNA is generally detectable in upper and lower respiratory specimens during the acute phase of infection. The lowest concentration of SARS-CoV-2 viral copies this assay can detect is 250 copies / mL. A negative result does not preclude SARS-CoV-2 infection and should not be used as the sole basis for treatment or other patient management decisions.  A negative result may occur with improper specimen collection / handling, submission of specimen other than nasopharyngeal swab, presence of viral mutation(s) within the areas targeted by this assay, and inadequate number of viral copies (<250 copies / mL). A negative result must be combined with clinical observations, patient history, and epidemiological information.  Fact Sheet for Patients:   StrictlyIdeas.no  Fact Sheet for Healthcare Providers: BankingDealers.co.za  This test is not yet approved or  cleared by the [place] FDA and has been authorized for detection and/or diagnosis of SARS-CoV-2 by FDA under an Emergency Use Authorization (EUA).  This EUA will remain in effect (meaning this test can be used) for the duration of the COVID-19 declaration under Section 564(b)(1) of the Act, 21 U.S.C. section 360bbb-3(b)(1), unless the authorization is terminated or revoked sooner.  Performed at [facility], [place] [address] [place], [place] [address]   MRSA PCR Screening     Status: None   Collection Time: 11/09/20  4:09 PM   Specimen: Nasal Mucosa; Nasopharyngeal  Result Value Ref Range Status   MRSA by PCR NEGATIVE NEGATIVE Final    Comment:        The GeneXpert MRSA Assay  (FDA approved for NASAL specimens only), is one component of a comprehensive MRSA colonization surveillance program. It is not intended to diagnose MRSA infection nor to guide or monitor treatment for MRSA infections. Performed at [facility], [place] [address], [place], [place] [address]      Time coordinating discharge: 35 minutes  SIGNED:   Aline August, MD  [facility] 11/14/2020, 9:45 AM

## 2020-11-14 NOTE — Discharge Instructions (Signed)
Acute Kidney Injury, Adult  Acute kidney injury is a sudden worsening of kidney function. The kidneys are organs that have several jobs. They filter the blood to remove waste products and extra fluid. They also maintain a healthy balance of minerals and hormones in the body, which helps control blood pressure and keep bones strong. With this condition, your kidneys do not do their jobs as well as they should. This condition ranges from mild to severe. Over time, it may develop into long-lasting (chronic) kidney disease. Early detection and treatment may prevent acute kidney injury from developing into a chronic condition. What are the causes? Common causes of this condition include:  A problem with blood flow to the kidneys. This may be caused by: ? Low blood pressure (hypotension) or shock. ? Blood loss. ? Heart and blood vessel (cardiovascular) disease. ? Severe burns. ? Liver disease.  Direct damage to the kidneys. This may be caused by: ? Certain medicines. ? A kidney infection. ? Poisoning. ? Being around or in contact with toxic substances. ? A surgical wound. ? A hard, direct hit to the kidney area.  A sudden blockage of urine flow. This may be caused by: ? Cancer. ? Kidney stones. ? An enlarged prostate in males. What increases the risk? You are more likely to develop this condition if you:  Are older than age 23.  Are male.  Are hospitalized, especially if you are in critical condition.  Have certain conditions, such as: ? Chronic kidney disease. ? Diabetes. ? Coronary artery disease and heart failure. ? Pulmonary disease. ? Chronic liver disease. What are the signs or symptoms? Symptoms of this condition may not be obvious until the condition becomes severe. Symptoms of this condition can include:  Tiredness (lethargy) or difficulty staying awake.  Nausea or vomiting.  Swelling (edema) of the face, legs, ankles, or feet.  Problems with urination, such  as: ? Pain in the abdomen, or pain along the side of your stomach (flank). ? Producing little or no urine. ? Passing urine with a weak flow.  Muscle twitches and cramps, especially in the legs.  Confusion or trouble concentrating.  Loss of appetite.  Fever. How is this diagnosed? Your health care provider can diagnose this condition based on your symptoms, medical history, and a physical exam.  You may also have other tests, such as:  Blood tests.  Urine tests.  Imaging tests.  A test in which a sample of tissue is removed from the kidneys to be examined under a microscope (kidney biopsy). How is this treated? Treatment for this condition depends on the cause and how severe the condition is. In mild cases, treatment may not be needed. The kidneys may heal on their own. In more severe cases, treatment will involve:  Treating the cause of the kidney injury. This may involve changing any medicines you are taking or adjusting your dosage.  Fluids. You may need specialized IV fluids to balance your body's needs.  Having a catheter placed to drain urine and prevent blockages.  Preventing problems from occurring. This may mean avoiding certain medicines or procedures that can cause further injury to the kidneys. In some cases, treatment may also require:  A procedure to remove toxic wastes from the body (dialysis or continuous renal replacement therapy, CRRT).  Surgery. This may be done to repair a torn kidney or to remove the blockage from the urinary system. Follow these instructions at home: Medicines  Take over-the-counter and prescription medicines only  as told by your health care provider.  Do not take any new medicines without your health care provider's approval. Many medicines can worsen your kidney damage.  Do not take any vitamin and mineral supplements without your health care provider's approval. Many nutritional supplements can worsen your kidney  damage. Lifestyle  If your health care provider prescribed changes to your diet, follow them. You may need to decrease the amount of protein you eat.  Achieve and maintain a healthy weight. If you need help with this, ask your health care provider.  Start or continue an exercise plan. Try to exercise at least 30 minutes a day, 5 days a week.  Do not use any products that contain nicotine or tobacco, such as cigarettes, e-cigarettes, and chewing tobacco. If you need help quitting, ask your health care provider.   General instructions  Keep track of your blood pressure. Report changes in your blood pressure as told by your health care provider.  Stay up to date with your vaccines. Ask your health care provider which vaccines you need.  Keep all follow-up visits as told by your health care provider. This is important.   Where to find more information  American Association of Kidney Patients: BombTimer.gl  National Kidney Foundation: www.kidney.[place]: https://mathis.com/  Life Options Rehabilitation Program: ? www.lifeoptions.org ? www.kidneyschool.org Contact a health care provider if:  Your symptoms get worse.  You develop new symptoms. Get help right away if:  You develop symptoms of worsening kidney disease, which include: ? Headaches. ? Abnormally dark or light skin. ? Easy bruising. ? Frequent hiccups. ? Chest pain. ? Shortness of breath. ? End of menstruation in women. ? Seizures. ? Confusion or altered mental status. ? Abdominal or back pain. ? Itchiness.  You have a fever.  Your body is producing less urine.  You have pain or bleeding when you urinate. Summary  Acute kidney injury is a sudden worsening of kidney function.  Acute kidney injury can be caused by problems with blood flow to the kidneys, direct damage to the kidneys, and sudden blockage of urine flow.  Symptoms of this condition may not be obvious until it becomes severe.  Symptoms may include edema, lethargy, confusion, nausea or vomiting, and problems passing urine.  This condition can be diagnosed with blood tests, urine tests, and imaging tests. Sometimes a kidney biopsy is done to diagnose this condition.  Treatment for this condition often involves treating the underlying cause. It is treated with fluids, medicines, diet changes, dialysis, or surgery. This information is not intended to replace advice given to you by your health care provider. Make sure you discuss any questions you have with your health care provider. Document Revised: 07/30/2019 Document Reviewed: 07/30/2019 Elsevier Patient Education  2021 [place].   Acute Kidney Injury, Adult  Acute kidney injury is a sudden worsening of kidney function. The kidneys are organs that have several jobs. They filter the blood to remove waste products and extra fluid. They also maintain a healthy balance of minerals and hormones in the body, which helps control blood pressure and keep bones strong. With this condition, your kidneys do not do their jobs as well as they should. This condition ranges from mild to severe. Over time, it may develop into long-lasting (chronic) kidney disease. Early detection and treatment may prevent acute kidney injury from developing into a chronic condition. What are the causes? Common causes of this condition include:  A problem with blood flow to  the kidneys. This may be caused by: ? Low blood pressure (hypotension) or shock. ? Blood loss. ? Heart and blood vessel (cardiovascular) disease. ? Severe burns. ? Liver disease.  Direct damage to the kidneys. This may be caused by: ? Certain medicines. ? A kidney infection. ? Poisoning. ? Being around or in contact with toxic substances. ? A surgical wound. ? A hard, direct hit to the kidney area.  A sudden blockage of urine flow. This may be caused by: ? Cancer. ? Kidney stones. ? An enlarged prostate in  males. What increases the risk? You are more likely to develop this condition if you:  Are older than age 19.  Are male.  Are hospitalized, especially if you are in critical condition.  Have certain conditions, such as: ? Chronic kidney disease. ? Diabetes. ? Coronary artery disease and heart failure. ? Pulmonary disease. ? Chronic liver disease. What are the signs or symptoms? Symptoms of this condition may not be obvious until the condition becomes severe. Symptoms of this condition can include:  Tiredness (lethargy) or difficulty staying awake.  Nausea or vomiting.  Swelling (edema) of the face, legs, ankles, or feet.  Problems with urination, such as: ? Pain in the abdomen, or pain along the side of your stomach (flank). ? Producing little or no urine. ? Passing urine with a weak flow.  Muscle twitches and cramps, especially in the legs.  Confusion or trouble concentrating.  Loss of appetite.  Fever. How is this diagnosed? Your health care provider can diagnose this condition based on your symptoms, medical history, and a physical exam.  You may also have other tests, such as:  Blood tests.  Urine tests.  Imaging tests.  A test in which a sample of tissue is removed from the kidneys to be examined under a microscope (kidney biopsy). How is this treated? Treatment for this condition depends on the cause and how severe the condition is. In mild cases, treatment may not be needed. The kidneys may heal on their own. In more severe cases, treatment will involve:  Treating the cause of the kidney injury. This may involve changing any medicines you are taking or adjusting your dosage.  Fluids. You may need specialized IV fluids to balance your body's needs.  Having a catheter placed to drain urine and prevent blockages.  Preventing problems from occurring. This may mean avoiding certain medicines or procedures that can cause further injury to the kidneys. In  some cases, treatment may also require:  A procedure to remove toxic wastes from the body (dialysis or continuous renal replacement therapy, CRRT).  Surgery. This may be done to repair a torn kidney or to remove the blockage from the urinary system. Follow these instructions at home: Medicines  Take over-the-counter and prescription medicines only as told by your health care provider.  Do not take any new medicines without your health care provider's approval. Many medicines can worsen your kidney damage.  Do not take any vitamin and mineral supplements without your health care provider's approval. Many nutritional supplements can worsen your kidney damage. Lifestyle  If your health care provider prescribed changes to your diet, follow them. You may need to decrease the amount of protein you eat.  Achieve and maintain a healthy weight. If you need help with this, ask your health care provider.  Start or continue an exercise plan. Try to exercise at least 30 minutes a day, 5 days a week.  Do not use any products that  contain nicotine or tobacco, such as cigarettes, e-cigarettes, and chewing tobacco. If you need help quitting, ask your health care provider.   General instructions  Keep track of your blood pressure. Report changes in your blood pressure as told by your health care provider.  Stay up to date with your vaccines. Ask your health care provider which vaccines you need.  Keep all follow-up visits as told by your health care provider. This is important.   Where to find more information  American Association of Kidney Patients: BombTimer.gl  National Kidney Foundation: www.kidney.[place]: https://mathis.com/  Life Options Rehabilitation Program: ? www.lifeoptions.org ? www.kidneyschool.org Contact a health care provider if:  Your symptoms get worse.  You develop new symptoms. Get help right away if:  You develop symptoms of worsening kidney disease,  which include: ? Headaches. ? Abnormally dark or light skin. ? Easy bruising. ? Frequent hiccups. ? Chest pain. ? Shortness of breath. ? End of menstruation in women. ? Seizures. ? Confusion or altered mental status. ? Abdominal or back pain. ? Itchiness.  You have a fever.  Your body is producing less urine.  You have pain or bleeding when you urinate. Summary  Acute kidney injury is a sudden worsening of kidney function.  Acute kidney injury can be caused by problems with blood flow to the kidneys, direct damage to the kidneys, and sudden blockage of urine flow.  Symptoms of this condition may not be obvious until it becomes severe. Symptoms may include edema, lethargy, confusion, nausea or vomiting, and problems passing urine.  This condition can be diagnosed with blood tests, urine tests, and imaging tests. Sometimes a kidney biopsy is done to diagnose this condition.  Treatment for this condition often involves treating the underlying cause. It is treated with fluids, medicines, diet changes, dialysis, or surgery. This information is not intended to replace advice given to you by your health care provider. Make sure you discuss any questions you have with your health care provider. Document Revised: 07/30/2019 Document Reviewed: 07/30/2019 Elsevier Patient Education  2021 [place].   Chronic Kidney Disease, Adult Chronic kidney disease is when lasting damage happens to the kidneys slowly over a long time. The kidneys help to:  Make pee (urine).  Make hormones.  Keep the right amount of fluids and chemicals in the body. Most often, this disease does not go away. You must take steps to help keep the kidney damage from getting worse. If steps are not taken, the kidneys might stop working forever. What are the causes?  Diabetes.  High blood pressure.  Diseases that affect the heart and blood vessels.  Other kidney diseases.  Diseases of the body's  disease-fighting system.  A problem with the flow of pee.  Infections of the organs that make pee, store it, and take it out of the body.  Swelling or irritation of your blood vessels. What increases the risk?  Getting older.  Having someone in your family who has kidney disease or kidney failure.  Having a disease caused by genes.  Taking medicines often that harm the kidneys.  Being near or having contact with harmful substances.  Being very overweight.  Using tobacco now or in the past. What are the signs or symptoms?  Feeling very tired.  Having a swollen face, legs, ankles, or feet.  Feeling like you may vomit or vomiting.  Not feeling hungry.  Being confused or not able to focus.  Twitches and cramps in the leg  muscles or other muscles.  Dry, itchy skin.  A taste of metal in your mouth.  Making less pee, or making more pee.  Shortness of breath.  Trouble sleeping. You may also become anemic or get weak bones. Anemic means there is not enough red blood cells or hemoglobin in your blood. You may get symptoms slowly. You may not notice them until the kidney damage gets very bad. How is this treated? Often, there is no cure for this disease. Treatment can help with symptoms and help keep the disease from getting worse. You may need to:  Avoid alcohol.  Avoid foods that are high in salt, potassium, phosphorous, and protein.  Take medicines for symptoms and to help control other conditions.  Have dialysis. This treatment gets harmful waste out of your body.  Treat other problems that cause your kidney disease or make it worse. Follow these instructions at home: Medicines  Take over-the-counter and prescription medicines only as told by your doctor.  Do not take any new medicines, vitamins, or supplements unless your doctor says it is okay. Lifestyle  Do not smoke or use any products that contain nicotine or tobacco. If you need help quitting, ask  your doctor.  If you drink alcohol: ? Limit how much you use to:  0-1 drink a day for women who are not pregnant.  0-2 drinks a day for men. ? Know how much alcohol is in your drink. In the [place] one drink equals one 12 oz bottle of beer (355 mL), one 5 oz glass of wine (148 mL), or one 1 oz glass of hard liquor (44 mL).  Stay at a healthy weight. If you need help losing weight, ask your doctor.   General instructions  Follow instructions from your doctor about what you cannot eat or drink.  Track your blood pressure at home. Tell your doctor about any changes.  If you have diabetes, track your blood sugar.  Exercise at least 30 minutes a day, 5 days a week.  Keep your shots (vaccinations) up to date.  Keep all follow-up visits.   Where to find more information  American Association of Kidney Patients: BombTimer.gl  National Kidney Foundation: www.kidney.[place]: https://mathis.com/  Life Options: www.lifeoptions.org  Kidney School: www.kidneyschool.org Contact a doctor if:  Your symptoms get worse.  You get new symptoms. Get help right away if:  You get symptoms of end-stage kidney disease. These include: ? Headaches. ? Losing feeling in your hands or feet. ? Easy bruising. ? Having hiccups often. ? Chest pain. ? Shortness of breath. ? Lack of menstrual periods, in women.  You have a fever.  You make less pee than normal.  You have pain or you bleed when you pee or poop. These symptoms may be an emergency. Get help right away. Call your local emergency services (911 in the [place]  Do not wait to see if the symptoms will go away.  Do not drive yourself to the hospital. Summary  Chronic kidney disease is when lasting damage happens to the kidneys slowly over a long time.  Causes of this disease include diabetes and high blood pressure.  Often, there is no cure for this disease. Treatment can help symptoms and help keep the disease from  getting worse.  Treatment may involve lifestyle changes, medicines, and dialysis. This information is not intended to replace advice given to you by your health care provider. Make sure you discuss any questions you have with your  health care provider. Document Revised: 12/25/2019 Document Reviewed: 12/25/2019 Elsevier Patient Education  2021 [place]. Acute Kidney Injury, Adult  Acute kidney injury is a sudden worsening of kidney function. The kidneys are organs that have several jobs. They filter the blood to remove waste products and extra fluid. They also maintain a healthy balance of minerals and hormones in the body, which helps control blood pressure and keep bones strong. With this condition, your kidneys do not do their jobs as well as they should. This condition ranges from mild to severe. Over time, it may develop into long-lasting (chronic) kidney disease. Early detection and treatment may prevent acute kidney injury from developing into a chronic condition. What are the causes? Common causes of this condition include:  A problem with blood flow to the kidneys. This may be caused by: ? Low blood pressure (hypotension) or shock. ? Blood loss. ? Heart and blood vessel (cardiovascular) disease. ? Severe burns. ? Liver disease.  Direct damage to the kidneys. This may be caused by: ? Certain medicines. ? A kidney infection. ? Poisoning. ? Being around or in contact with toxic substances. ? A surgical wound. ? A hard, direct hit to the kidney area.  A sudden blockage of urine flow. This may be caused by: ? Cancer. ? Kidney stones. ? An enlarged prostate in males. What increases the risk? You are more likely to develop this condition if you:  Are older than age 91.  Are male.  Are hospitalized, especially if you are in critical condition.  Have certain conditions, such as: ? Chronic kidney disease. ? Diabetes. ? Coronary artery disease and heart  failure. ? Pulmonary disease. ? Chronic liver disease. What are the signs or symptoms? Symptoms of this condition may not be obvious until the condition becomes severe. Symptoms of this condition can include:  Tiredness (lethargy) or difficulty staying awake.  Nausea or vomiting.  Swelling (edema) of the face, legs, ankles, or feet.  Problems with urination, such as: ? Pain in the abdomen, or pain along the side of your stomach (flank). ? Producing little or no urine. ? Passing urine with a weak flow.  Muscle twitches and cramps, especially in the legs.  Confusion or trouble concentrating.  Loss of appetite.  Fever. How is this diagnosed? Your health care provider can diagnose this condition based on your symptoms, medical history, and a physical exam.  You may also have other tests, such as:  Blood tests.  Urine tests.  Imaging tests.  A test in which a sample of tissue is removed from the kidneys to be examined under a microscope (kidney biopsy). How is this treated? Treatment for this condition depends on the cause and how severe the condition is. In mild cases, treatment may not be needed. The kidneys may heal on their own. In more severe cases, treatment will involve:  Treating the cause of the kidney injury. This may involve changing any medicines you are taking or adjusting your dosage.  Fluids. You may need specialized IV fluids to balance your body's needs.  Having a catheter placed to drain urine and prevent blockages.  Preventing problems from occurring. This may mean avoiding certain medicines or procedures that can cause further injury to the kidneys. In some cases, treatment may also require:  A procedure to remove toxic wastes from the body (dialysis or continuous renal replacement therapy, CRRT).  Surgery. This may be done to repair a torn kidney or to remove the blockage from the  urinary system. Follow these instructions at home: Medicines  Take  over-the-counter and prescription medicines only as told by your health care provider.  Do not take any new medicines without your health care provider's approval. Many medicines can worsen your kidney damage.  Do not take any vitamin and mineral supplements without your health care provider's approval. Many nutritional supplements can worsen your kidney damage. Lifestyle  If your health care provider prescribed changes to your diet, follow them. You may need to decrease the amount of protein you eat.  Achieve and maintain a healthy weight. If you need help with this, ask your health care provider.  Start or continue an exercise plan. Try to exercise at least 30 minutes a day, 5 days a week.  Do not use any products that contain nicotine or tobacco, such as cigarettes, e-cigarettes, and chewing tobacco. If you need help quitting, ask your health care provider.   General instructions  Keep track of your blood pressure. Report changes in your blood pressure as told by your health care provider.  Stay up to date with your vaccines. Ask your health care provider which vaccines you need.  Keep all follow-up visits as told by your health care provider. This is important.   Where to find more information  American Association of Kidney Patients: BombTimer.gl  National Kidney Foundation: www.kidney.[place]: https://mathis.com/  Life Options Rehabilitation Program: ? www.lifeoptions.org ? www.kidneyschool.org Contact a health care provider if:  Your symptoms get worse.  You develop new symptoms. Get help right away if:  You develop symptoms of worsening kidney disease, which include: ? Headaches. ? Abnormally dark or light skin. ? Easy bruising. ? Frequent hiccups. ? Chest pain. ? Shortness of breath. ? End of menstruation in women. ? Seizures. ? Confusion or altered mental status. ? Abdominal or back pain. ? Itchiness.  You have a fever.  Your body is  producing less urine.  You have pain or bleeding when you urinate. Summary  Acute kidney injury is a sudden worsening of kidney function.  Acute kidney injury can be caused by problems with blood flow to the kidneys, direct damage to the kidneys, and sudden blockage of urine flow.  Symptoms of this condition may not be obvious until it becomes severe. Symptoms may include edema, lethargy, confusion, nausea or vomiting, and problems passing urine.  This condition can be diagnosed with blood tests, urine tests, and imaging tests. Sometimes a kidney biopsy is done to diagnose this condition.  Treatment for this condition often involves treating the underlying cause. It is treated with fluids, medicines, diet changes, dialysis, or surgery. This information is not intended to replace advice given to you by your health care provider. Make sure you discuss any questions you have with your health care provider. Document Revised: 07/30/2019 Document Reviewed: 07/30/2019 Elsevier Patient Education  2021 [place].   Chronic Kidney Disease, Adult Chronic kidney disease is when lasting damage happens to the kidneys slowly over a long time. The kidneys help to:  Make pee (urine).  Make hormones.  Keep the right amount of fluids and chemicals in the body. Most often, this disease does not go away. You must take steps to help keep the kidney damage from getting worse. If steps are not taken, the kidneys might stop working forever. What are the causes?  Diabetes.  High blood pressure.  Diseases that affect the heart and blood vessels.  Other kidney diseases.  Diseases of the body's disease-fighting system.  A problem with the flow of pee.  Infections of the organs that make pee, store it, and take it out of the body.  Swelling or irritation of your blood vessels. What increases the risk?  Getting older.  Having someone in your family who has kidney disease or kidney  failure.  Having a disease caused by genes.  Taking medicines often that harm the kidneys.  Being near or having contact with harmful substances.  Being very overweight.  Using tobacco now or in the past. What are the signs or symptoms?  Feeling very tired.  Having a swollen face, legs, ankles, or feet.  Feeling like you may vomit or vomiting.  Not feeling hungry.  Being confused or not able to focus.  Twitches and cramps in the leg muscles or other muscles.  Dry, itchy skin.  A taste of metal in your mouth.  Making less pee, or making more pee.  Shortness of breath.  Trouble sleeping. You may also become anemic or get weak bones. Anemic means there is not enough red blood cells or hemoglobin in your blood. You may get symptoms slowly. You may not notice them until the kidney damage gets very bad. How is this treated? Often, there is no cure for this disease. Treatment can help with symptoms and help keep the disease from getting worse. You may need to:  Avoid alcohol.  Avoid foods that are high in salt, potassium, phosphorous, and protein.  Take medicines for symptoms and to help control other conditions.  Have dialysis. This treatment gets harmful waste out of your body.  Treat other problems that cause your kidney disease or make it worse. Follow these instructions at home: Medicines  Take over-the-counter and prescription medicines only as told by your doctor.  Do not take any new medicines, vitamins, or supplements unless your doctor says it is okay. Lifestyle  Do not smoke or use any products that contain nicotine or tobacco. If you need help quitting, ask your doctor.  If you drink alcohol: ? Limit how much you use to:  0-1 drink a day for women who are not pregnant.  0-2 drinks a day for men. ? Know how much alcohol is in your drink. In the [place] one drink equals one 12 oz bottle of beer (355 mL), one 5 oz glass of wine (148 mL), or one 1 oz  glass of hard liquor (44 mL).  Stay at a healthy weight. If you need help losing weight, ask your doctor.   General instructions  Follow instructions from your doctor about what you cannot eat or drink.  Track your blood pressure at home. Tell your doctor about any changes.  If you have diabetes, track your blood sugar.  Exercise at least 30 minutes a day, 5 days a week.  Keep your shots (vaccinations) up to date.  Keep all follow-up visits.   Where to find more information  American Association of Kidney Patients: BombTimer.gl  National Kidney Foundation: www.kidney.[place]: https://mathis.com/  Life Options: www.lifeoptions.org  Kidney School: www.kidneyschool.org Contact a doctor if:  Your symptoms get worse.  You get new symptoms. Get help right away if:  You get symptoms of end-stage kidney disease. These include: ? Headaches. ? Losing feeling in your hands or feet. ? Easy bruising. ? Having hiccups often. ? Chest pain. ? Shortness of breath. ? Lack of menstrual periods, in women.  You have a fever.  You make less pee than normal.  You  have pain or you bleed when you pee or poop. These symptoms may be an emergency. Get help right away. Call your local emergency services (911 in the [place]  Do not wait to see if the symptoms will go away.  Do not drive yourself to the hospital. Summary  Chronic kidney disease is when lasting damage happens to the kidneys slowly over a long time.  Causes of this disease include diabetes and high blood pressure.  Often, there is no cure for this disease. Treatment can help symptoms and help keep the disease from getting worse.  Treatment may involve lifestyle changes, medicines, and dialysis. This information is not intended to replace advice given to you by your health care provider. Make sure you discuss any questions you have with your health care provider. Document Revised: 12/25/2019 Document  Reviewed: 12/25/2019 Elsevier Patient Education  [place].

## 2020-11-14 NOTE — Progress Notes (Signed)
Nsg Discharge Note  Admit Date:  11/09/2020 Discharge date: 11/14/2020   Stanley [name] to be D/C'd  per MD order.  AVS completed.  Patient/caregiver able to verbalize understanding.  Discharge Medication: Allergies as of 11/14/2020      Reactions   Lactose Intolerance (gi) Nausea And Vomiting   Nitroglycerin Anxiety, Other (See Comments)   Pain and feels like he is on fire   Other Swelling   All beans   Isosorbide Itching   headaches      Medication List    STOP taking these medications   ibuprofen 800 MG tablet Commonly known as: ADVIL   traZODone 100 MG tablet Commonly known as: DESYREL     TAKE these medications   acetaminophen 500 MG tablet Commonly known as: TYLENOL Take 1,000 mg by mouth as needed for mild pain.   albuterol 108 (90 Base) MCG/ACT inhaler Commonly known as: ProAir HFA Inhale 1-2 puffs into the lungs every 6 (six) hours as needed for wheezing or shortness of breath. What changed: when to take this   amLODipine 10 MG tablet Commonly known as: NORVASC Take 1 tablet (10 mg total) by mouth daily.   aspirin EC 81 MG tablet Take 81 mg by mouth daily.   atorvastatin 40 MG tablet Commonly known as: LIPITOR Take 1 tablet (40 mg total) by mouth daily. Please keep upcoming appt in June with Dr. Marlou Porch before anymore refills. Thank you   cloNIDine 0.1 MG tablet Commonly known as: CATAPRES Take 1 tablet (0.1 mg total) by mouth 2 (two) times daily.   doxazosin 8 MG tablet Commonly known as: CARDURA Take 2 tablets (16 mg total) by mouth daily.   folic acid Q000111Q MCG tablet Commonly known as: FOLVITE Take 0.5 tablets (400 mcg total) by mouth daily.   furosemide 20 MG tablet Commonly known as: LASIX Take 2 tablets (40 mg total) by mouth daily. What changed:   how much to take  when to take this  reasons to take this   hydrALAZINE 100 MG tablet Commonly known as: APRESOLINE Take 1 tablet (100 mg total) by mouth 3 (three) times daily.   labetalol  200 MG tablet Commonly known as: NORMODYNE Take 3 tablets (600 mg total) by mouth 3 (three) times daily.   multivitamin with minerals Tabs tablet Take 1 tablet by mouth daily.   ondansetron 4 MG disintegrating tablet Commonly known as: Zofran ODT Take 1 tablet (4 mg total) by mouth every 8 (eight) hours as needed for nausea or vomiting.   PSYLLIUM PO Take 1 tablet by mouth daily.   sodium bicarbonate 650 MG tablet Take 2 tablets (1,300 mg total) by mouth 2 (two) times daily.       Discharge Assessment: Vitals:   11/14/20 0455 11/14/20 0922  BP: (!) 180/99 (!) 172/88  Pulse: 70 72  Resp: 18 18  Temp: 99.8 F (37.7 C) 99.2 F (37.3 C)  SpO2: 94% 96%   Skin clean, dry and intact without evidence of skin break down, no evidence of skin tears noted. IV catheter discontinued intact. Site without signs and symptoms of complications - no redness or edema noted at insertion site, patient denies c/o pain - only slight tenderness at site.  Dressing with slight pressure applied.  D/c Instructions-Education: Discharge instructions given to patient/family with verbalized understanding. D/c education completed with patient/family including follow up instructions, medication list, d/c activities limitations if indicated, with other d/c instructions as indicated by MD - patient able to  verbalize understanding, all questions fully answered. Patient instructed to return to ED, call 911, or call MD for any changes in condition.  Patient escorted via [place], and D/C home via private auto.  Ahmaud Duthie, Jolene Schimke, RN 11/14/2020 11:28 AM

## 2020-11-16 ENCOUNTER — Telehealth: Payer: Self-pay

## 2020-11-16 NOTE — Telephone Encounter (Signed)
Transition Care Management Unsuccessful Follow-up Telephone Call  Date of discharge and from where:  11/14/20 from [facility]  Attempts:  1st Attempt  Reason for unsuccessful TCM follow-up call:  Left voice message

## 2020-11-17 NOTE — Telephone Encounter (Signed)
Transition Care Management Unsuccessful Follow-up Telephone Call  Date of discharge and from where:  11/14/20 from [facility]  Attempts:  2nd Attempt  Reason for unsuccessful TCM follow-up call:  Left voice message

## 2020-11-18 NOTE — Telephone Encounter (Signed)
Transition Care Management Unsuccessful Follow-up Telephone Call  Date of discharge and from where:  11/14/2020 from [facility]  Attempts:  3rd Attempt  Reason for unsuccessful TCM follow-up call:  Left voice message

## 2020-11-27 ENCOUNTER — Ambulatory Visit (INDEPENDENT_AMBULATORY_CARE_PROVIDER_SITE_OTHER): Payer: Medicaid Other | Admitting: Family Medicine

## 2020-11-27 ENCOUNTER — Other Ambulatory Visit: Payer: Self-pay

## 2020-11-27 ENCOUNTER — Encounter: Payer: Self-pay | Admitting: Family Medicine

## 2020-11-27 VITALS — BP 200/101 | HR 67 | Temp 100.2°F | Ht 71.0 in

## 2020-11-27 DIAGNOSIS — I1311 Hypertensive heart and chronic kidney disease without heart failure, with stage 5 chronic kidney disease, or end stage renal disease: Secondary | ICD-10-CM | POA: Diagnosis not present

## 2020-11-27 DIAGNOSIS — I130 Hypertensive heart and chronic kidney disease with heart failure and stage 1 through stage 4 chronic kidney disease, or unspecified chronic kidney disease: Secondary | ICD-10-CM

## 2020-11-27 DIAGNOSIS — N185 Chronic kidney disease, stage 5: Secondary | ICD-10-CM

## 2020-11-27 DIAGNOSIS — I10 Essential (primary) hypertension: Secondary | ICD-10-CM

## 2020-11-27 DIAGNOSIS — Z09 Encounter for follow-up examination after completed treatment for conditions other than malignant neoplasm: Secondary | ICD-10-CM

## 2020-11-27 DIAGNOSIS — I509 Heart failure, unspecified: Secondary | ICD-10-CM

## 2020-11-27 DIAGNOSIS — I161 Hypertensive emergency: Secondary | ICD-10-CM

## 2020-11-27 DIAGNOSIS — F419 Anxiety disorder, unspecified: Secondary | ICD-10-CM

## 2020-11-27 DIAGNOSIS — N184 Chronic kidney disease, stage 4 (severe): Secondary | ICD-10-CM

## 2020-11-27 MED ORDER — CLONIDINE HCL 0.1 MG PO TABS
0.1000 mg | ORAL_TABLET | Freq: Once | ORAL | Status: AC
  Administered 2020-11-27: 0.1 mg via ORAL

## 2020-11-27 MED ORDER — CLONIDINE HCL 0.1 MG PO TABS: 0.1000 mg | ORAL_TABLET | Freq: Once | ORAL | Status: DC

## 2020-11-27 MED ORDER — CLONIDINE HCL 0.2 MG PO TABS
0.2000 mg | ORAL_TABLET | Freq: Once | ORAL | Status: AC
  Administered 2020-11-27: 0.2 mg via ORAL

## 2020-11-27 NOTE — Progress Notes (Addendum)
Patient [facility] Follow Up  Subjective:  Patient ID: Stanley [name], male    DOB: [birth year]  Age: 45 y.o. MRN: [id number]  CC:  Chief Complaint  Patient presents with  . Hospitalization Follow-up    Hospital follow up , kidney failure     HPI Stanley [name] is a 45 year old male who presents for Hospital Up today.   Patient Active Problem List   Diagnosis Date Noted  . Observed sleep apnea 12/11/2019  . Pain in both testicles 11/19/2019  . Hypertensive crisis 08/23/2019  . Acute posterior epistaxis 08/23/2019  . Heart palpitations 02/23/2019  . Anxiety 12/19/2018  . Acute pain of right knee 12/19/2018  . Inflammation of joint of right knee 12/19/2018  . Swollen testicle 12/19/2018  . Syncope and collapse   . Transient vision disturbance of left eye   . Hydrocele   . Hypertensive urgency 06/12/2017  . CKD stage 4 secondary to hypertension ([place]) 03/01/2017  . Anemia, chronic disease 03/01/2017  . Hyperparathyroidism, secondary renal ([place]) 03/01/2017  . Hypertensive emergency 09/26/2016  . Tobacco abuse 09/26/2016  . Hypertension 09/26/2016  . AKI (acute kidney injury) ([place]) 09/26/2016   Current Status: Since his last office visit, he has had a Hospital Admission on 11/09/2020-11/14/2020 for Hypertensive Urgency, Acute Kidney Failure. Today he continues to have elevated blood pressure readings. He denies use of alcohol or illegal drugs. He denies use of energy drinks. He states that he is doing well with no complaints. He continues to follow up with Nephrology as needed, which he has follow up appointment today at 3 pm. Nephology prescribed Lasix 80 mg X 30 days and will evaluate him for possible graft placement. He denies visual changes, chest pain, cough, shortness of breath, heart palpitations, and falls. He has occasional headaches and dizziness with position changes. Denies severe headaches, confusion, seizures, double  vision, and blurred vision, nausea and vomiting. He denies fevers, chills, fatigue, recent infections, weight loss, and night sweats. Denies GI problems such as diarrhea, and constipation. He has no reports of blood in stools, dysuria and hematuria. No depression or anxiety reported today. He is taking all medications as prescribed. He denies pain today.   Past Medical History:  Diagnosis Date  . Anxiety   . CKD (chronic kidney disease), stage IV ([place])   . Depression   . Dyslexia   . Heart palpitations   . High cholesterol   . Hypertension   . Hypertensive urgency   . Polysubstance (including opioids) dependence, binge pattern ([place]) 08/2020  . Stroke ([place]) 06/03/2017   "minor; completely recovered today" (06/13/2017)  . Tobacco abuse     Past Surgical History:  Procedure Laterality Date  . NO PAST SURGERIES      Family History  Problem Relation Age of Onset  . Hypertension Mother   . Hypertension Brother   . Diabetes type II Brother     Social History   Socioeconomic History  . Marital status: Married    Spouse name: Not on file  . Number of children: Not on file  . Years of education: Not on file  . Highest education level: Not on file  Occupational History  . Not on file  Tobacco Use  . Smoking status: Former Smoker    Packs/day: 1.00    Years: 30.00    Pack years: 30.00    Types: Cigarettes  . Smokeless tobacco: Never Used  Vaping Use  . Vaping Use: Never used  Substance and Sexual Activity  . Alcohol use: No  . Drug use: Yes    Types: Marijuana    Comment: 06/13/2017 "not often"  . Sexual activity: Not Currently  Other Topics Concern  . Not on file  Social History Narrative  . Not on file   Social Determinants of Health   Financial Resource Strain: Not on file  Food Insecurity: Not on file  Transportation Needs: Not on file  Physical Activity: Not on file  Stress: Not on file  Social Connections: Not on file  Intimate Partner Violence: Not on file     Outpatient Medications Prior to Visit  Medication Sig Dispense Refill  . acetaminophen (TYLENOL) 500 MG tablet Take 1,000 mg by mouth as needed for mild pain.    Marland Kitchen albuterol (PROAIR HFA) 108 (90 Base) MCG/ACT inhaler Inhale 1-2 puffs into the lungs every 6 (six) hours as needed for wheezing or shortness of breath. (Patient taking differently: Inhale 1-2 puffs into the lungs as needed for wheezing or shortness of breath.) 18 g 11  . amLODipine (NORVASC) 10 MG tablet Take 1 tablet (10 mg total) by mouth daily. 90 tablet 3  . aspirin EC 81 MG tablet Take 81 mg by mouth daily.    Marland Kitchen atorvastatin (LIPITOR) 40 MG tablet Take 1 tablet (40 mg total) by mouth daily. Please keep upcoming appt in June with Dr. Marlou Porch before anymore refills. Thank you 30 tablet 11  . cloNIDine (CATAPRES) 0.1 MG tablet Take 1 tablet (0.1 mg total) by mouth 2 (two) times daily. 60 tablet 0  . doxazosin (CARDURA) 8 MG tablet Take 2 tablets (16 mg total) by mouth daily. [id number] tablet 3  . folic acid (FOLVITE) Q000111Q MCG tablet Take 0.5 tablets (400 mcg total) by mouth daily. 30 tablet 11  . furosemide (LASIX) 20 MG tablet Take 2 tablets (40 mg total) by mouth daily. 60 tablet 0  . hydrALAZINE (APRESOLINE) 100 MG tablet Take 1 tablet (100 mg total) by mouth 3 (three) times daily. 270 tablet 3  . labetalol (NORMODYNE) 200 MG tablet Take 3 tablets (600 mg total) by mouth 3 (three) times daily. 810 tablet 3  . Multiple Vitamin (MULTIVITAMIN WITH MINERALS) TABS tablet Take 1 tablet by mouth daily.    . ondansetron (ZOFRAN ODT) 4 MG disintegrating tablet Take 1 tablet (4 mg total) by mouth every 8 (eight) hours as needed for nausea or vomiting. 30 tablet 3  . PSYLLIUM PO Take 1 tablet by mouth daily.    . sodium bicarbonate 650 MG tablet Take 2 tablets (1,300 mg total) by mouth 2 (two) times daily. 120 tablet 0   No facility-administered medications prior to visit.    Allergies  Allergen Reactions  . Lactose Intolerance (Gi) Nausea  And Vomiting  . Nitroglycerin Anxiety and Other (See Comments)    Pain and feels like he is on fire  . Other Swelling    All beans  . Isosorbide Itching    headaches    ROS Review of Systems  Constitutional: Negative.   HENT: Negative.   Eyes: Negative.   Respiratory: Positive for shortness of breath (occasional).   Cardiovascular: Negative.   Gastrointestinal: Negative.   Endocrine: Negative.   Genitourinary: Negative.   Musculoskeletal: Positive for arthralgias (generalized joint pain).  Skin: Negative.   Allergic/Immunologic: Negative.   Neurological: Positive for dizziness (occasional ) and headaches (occasional ).  Hematological: Negative.   Psychiatric/Behavioral: Negative.  Objective:    Physical Exam Vitals and nursing note reviewed.  Constitutional:      Appearance: Normal appearance.  HENT:     Head: Normocephalic and atraumatic.     Nose: Nose normal.     Mouth/Throat:     Mouth: Mucous membranes are moist.     Pharynx: Oropharynx is clear.  Cardiovascular:     Rate and Rhythm: Normal rate and regular rhythm.     Pulses: Normal pulses.     Heart sounds: Normal heart sounds.  Pulmonary:     Effort: Pulmonary effort is normal.     Breath sounds: Normal breath sounds.  Abdominal:     General: Bowel sounds are normal.     Palpations: Abdomen is soft.  Musculoskeletal:        General: Normal range of motion.     Cervical back: Normal range of motion and neck supple.  Skin:    General: Skin is warm and dry.  Neurological:     General: No focal deficit present.     Mental Status: He is alert and oriented to person, place, and time.  Psychiatric:        Mood and Affect: Mood normal.        Behavior: Behavior normal.        Thought Content: Thought content normal.        Judgment: Judgment normal.    BP (!) 197/101   Pulse 67   Temp 100.2 F (37.9 C) (Temporal)   Ht '5\' 11"'$  (1.803 m)   SpO2 99%   BMI 26.01 kg/m  Wt Readings from Last 3  Encounters:  11/13/20 186 lb 8.2 oz (84.6 kg)  08/10/20 207 lb 12.8 oz (94.3 kg)  07/10/20 211 lb (95.7 kg)     Health Maintenance Due  Topic Date Due  . Hepatitis C Screening  Never done  . COVID-19 Vaccine (1) Never done  . TETANUS/TDAP  Never done    There are no preventive care reminders to display for this patient.  Lab Results  Component Value Date   TSH 1.310 03/01/2018   Lab Results  Component Value Date   WBC 6.3 11/13/2020   HGB 8.1 (L) 11/13/2020   HCT 26.7 (L) 11/13/2020   MCV 91.1 11/13/2020   PLT 311 11/13/2020   Lab Results  Component Value Date   NA 135 11/13/2020   K 4.4 11/13/2020   CO2 19 (L) 11/13/2020   GLUCOSE 81 11/13/2020   BUN 54 (H) 11/13/2020   CREATININE 6.88 (H) 11/13/2020   BILITOT 1.0 11/09/2020   ALKPHOS 90 11/09/2020   AST 30 11/09/2020   ALT 25 11/09/2020   PROT 6.8 11/09/2020   ALBUMIN 3.3 (L) 11/13/2020   CALCIUM 9.0 11/13/2020   ANIONGAP 13 11/13/2020   Lab Results  Component Value Date   CHOL 91 (L) 03/17/2020   Lab Results  Component Value Date   HDL 36 (L) 03/17/2020   Lab Results  Component Value Date   LDLCALC 41 03/17/2020   Lab Results  Component Value Date   TRIG 62 03/17/2020   Lab Results  Component Value Date   CHOLHDL 2.5 03/17/2020   Lab Results  Component Value Date   HGBA1C 5.0 07/10/2020   HGBA1C 5.0 07/10/2020   HGBA1C 5.0 (A) 07/10/2020   HGBA1C 5.0 07/10/2020    Assessment & Plan:   1. Hospital discharge follow-up  2. Acute congestive heart failure, unspecified heart failure type ([place])  3.  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 He will follow up with Nephrologist to possibly  schedule Dialysis treatments. Monitor.   4. Chronic congestive heart failure, unspecified heart failure type ([place])  5. Hypertensive emergency - cloNIDine (CATAPRES) tablet 0.3 mg  6. Uncontrolled hypertension Blood pressures are elevated today.  Clonidine 0.3 mg given to patient in office and blood pressures remain elevated. We referred him to ED via ambulance at this time. Patient refused and signed AMA form at discharge. He denies severe headaches, confusion, seizures, double vision, and blurred vision, nausea and vomiting. He will report to ED if he experiences these symptoms. Patient verbalized understanding.    7. Hypertension, accelerated  8. Anxiety  9. Follow up He will follow up in 3 months.   Meds ordered this encounter  Medications  . DISCONTD: cloNIDine (CATAPRES) tablet 0.1 mg  . cloNIDine (CATAPRES) tablet 0.2 mg    No orders of the defined types were placed in this encounter.   Referral Orders  No referral(s) requested today   Kathe Becton, MSN, ANE, FNP-BC [facility] [address] 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 DISCONTD: cloNIDine (CATAPRES) tablet 0.1 mg  . cloNIDine  (CATAPRES) tablet 0.2 mg    Follow-up: No follow-ups on file.    [name], FNP

## 2020-12-01 ENCOUNTER — Encounter: Payer: Self-pay | Admitting: Family Medicine

## 2020-12-18 ENCOUNTER — Ambulatory Visit: Payer: Medicaid Other | Admitting: Family Medicine

## 2020-12-28 ENCOUNTER — Ambulatory Visit: Payer: Medicaid Other | Admitting: Family Medicine

## 2020-12-30 ENCOUNTER — Ambulatory Visit: Payer: Medicaid Other | Admitting: Family Medicine

## 2021-01-05 ENCOUNTER — Ambulatory Visit: Payer: Medicaid Other | Admitting: Family Medicine

## 2021-01-06 ENCOUNTER — Encounter: Payer: Self-pay | Admitting: Family Medicine

## 2021-01-12 ENCOUNTER — Encounter: Payer: Medicaid Other | Admitting: Vascular Surgery

## 2021-01-13 ENCOUNTER — Other Ambulatory Visit: Payer: Self-pay | Admitting: Family Medicine

## 2021-01-13 DIAGNOSIS — N184 Chronic kidney disease, stage 4 (severe): Secondary | ICD-10-CM

## 2021-01-13 DIAGNOSIS — I129 Hypertensive chronic kidney disease with stage 1 through stage 4 chronic kidney disease, or unspecified chronic kidney disease: Secondary | ICD-10-CM

## 2021-01-13 DIAGNOSIS — I509 Heart failure, unspecified: Secondary | ICD-10-CM

## 2021-01-14 ENCOUNTER — Ambulatory Visit: Payer: Medicaid Other | Admitting: Nurse Practitioner

## 2021-01-24 NOTE — Progress Notes (Deleted)
[facility] [place]  ASSESSMENT / PLAN: Stanley [name] is a 45 y.o. *** handed male in need of permanent hemodialysis access. I reviewed options for dialysis in detail with the patient. I counseled the patient that dialysis access requires surveillance and periodic maintenance. Plan to proceed with ***.   CHIEF COMPLAINT: ***  HISTORY OF PRESENT ILLNESS: Stanley [name] is a 45 y.o. male who presents for discussion of permanent HD access. He is *** handed. He reports no history of pacemaker or central venous catheter ***. He has *** had dialysis access surgery.   VASCULAR SURGICAL HISTORY: ***  VASCULAR RISK FACTORS: Patient reports: - *** history of cerebrovascular disease / stroke / transient ischemic attack. - *** history of coronary artery disease. *** history of PCI. *** history of CABG.  - *** history of diabetes mellitus ( Hemoglobin A1C (%)  Date Value  07/10/2020 5.0   HbA1c, POC (prediabetic range) (%)  Date Value  07/10/2020 5.0 (A)   HbA1c, POC (controlled diabetic range) (%)  Date Value  07/10/2020 5.0   HbA1c POC (<> result, manual entry) (%)  Date Value  07/10/2020 5.0  ). - *** history of smoking. *** pack / years. *** actively smoking. - *** history of hypertension. *** drug regimen with *** control. - *** history of chronic kidney disease (GFR CrCl cannot be calculated (Patient's most recent lab result is older than the maximum 21 days allowed.).). - *** history of chronic obstructive pulmonary disease, treated with ***.  Past Medical History:  Diagnosis Date  . Anxiety   . Chronic kidney disease (CKD), stage V ([place]) 11/2020  . CKD (chronic kidney disease), stage IV ([place])   . Depression   . Dyslexia   . Heart palpitations   . High cholesterol   . Hypertension   . Hypertensive urgency   . Polysubstance (including opioids) dependence, binge pattern ([name]) 08/2020  . Stroke ([place]) 06/03/2017   "minor; completely recovered today"  (06/13/2017)  . Tobacco abuse     Past Surgical History:  Procedure Laterality Date  . NO PAST SURGERIES      Family History  Problem Relation Age of Onset  . Hypertension Mother   . Hypertension Brother   . Diabetes type II Brother     Social History   Socioeconomic History  . Marital status: Married    Spouse name: Not on file  . Number of children: Not on file  . Years of education: Not on file  . Highest education level: Not on file  Occupational History  . Not on file  Tobacco Use  . Smoking status: Former Smoker    Packs/day: 1.00    Years: 30.00    Pack years: 30.00    Types: Cigarettes  . Smokeless tobacco: Never Used  Vaping Use  . Vaping Use: Never used  Substance and Sexual Activity  . Alcohol use: No  . Drug use: Yes    Types: Marijuana    Comment: 06/13/2017 "not often"  . Sexual activity: Not Currently  Other Topics Concern  . Not on file  Social History Narrative  . Not on file   Social Determinants of Health   Financial Resource Strain: Not on file  Food Insecurity: Not on file  Transportation Needs: Not on file  Physical Activity: Not on file  Stress: Not on file  Social Connections: Not on file  Intimate Partner Violence: Not on file    Allergies  Allergen Reactions  .  Lactose Intolerance (Gi) Nausea And Vomiting  . Nitroglycerin Anxiety and Other (See Comments)    Pain and feels like he is on fire  . Other Swelling    All beans  . Isosorbide Itching    headaches    Current Outpatient Medications  Medication Sig Dispense Refill  . acetaminophen (TYLENOL) 500 MG tablet Take 1,000 mg by mouth as needed for mild pain.    Marland Kitchen albuterol (PROAIR HFA) 108 (90 Base) MCG/ACT inhaler Inhale 1-2 puffs into the lungs every 6 (six) hours as needed for wheezing or shortness of breath. (Patient taking differently: Inhale 1-2 puffs into the lungs as needed for wheezing or shortness of breath.) 18 g 11  . amLODipine (NORVASC) 10 MG tablet Take 1  tablet (10 mg total) by mouth daily. 90 tablet 3  . aspirin EC 81 MG tablet Take 81 mg by mouth daily.    Marland Kitchen atorvastatin (LIPITOR) 40 MG tablet Take 1 tablet (40 mg total) by mouth daily. Please keep upcoming appt in June with Dr. Marlou Porch before anymore refills. Thank you 30 tablet 11  . cloNIDine (CATAPRES) 0.1 MG tablet Take 1 tablet (0.1 mg total) by mouth 2 (two) times daily. 60 tablet 0  . doxazosin (CARDURA) 8 MG tablet Take 2 tablets (16 mg total) by mouth daily. [id number] tablet 3  . folic acid (FOLVITE) Q000111Q MCG tablet Take 0.5 tablets (400 mcg total) by mouth daily. 30 tablet 11  . furosemide (LASIX) 20 MG tablet Take 2 tablets (40 mg total) by mouth daily. 60 tablet 0  . hydrALAZINE (APRESOLINE) 100 MG tablet Take 1 tablet (100 mg total) by mouth 3 (three) times daily. 270 tablet 3  . labetalol (NORMODYNE) 200 MG tablet Take 3 tablets (600 mg total) by mouth 3 (three) times daily. 810 tablet 3  . Multiple Vitamin (MULTIVITAMIN WITH MINERALS) TABS tablet Take 1 tablet by mouth daily.    . ondansetron (ZOFRAN ODT) 4 MG disintegrating tablet Take 1 tablet (4 mg total) by mouth every 8 (eight) hours as needed for nausea or vomiting. 30 tablet 3  . PSYLLIUM PO Take 1 tablet by mouth daily.     No current facility-administered medications for this visit.    REVIEW OF SYSTEMS:  '[X]'$  denotes positive finding, '[ ]'$  denotes negative finding Cardiac  Comments:  Chest pain or chest pressure: ***   Shortness of breath upon exertion:    Short of breath when lying flat:    Irregular heart rhythm:        Vascular    Pain in calf, thigh, or hip brought on by ambulation:    Pain in feet at night that wakes you up from your sleep:     Blood clot in your veins:    Leg swelling:         Pulmonary    Oxygen at home:    Productive cough:     Wheezing:         Neurologic    Sudden weakness in arms or legs:     Sudden numbness in arms or legs:     Sudden onset of difficulty speaking or slurred speech:     Temporary loss of vision in one eye:     Problems with dizziness:         Gastrointestinal    Blood in stool:     Vomited blood:         Genitourinary    Burning when urinating:  Blood in urine:        Psychiatric    Major depression:         Hematologic    Bleeding problems:    Problems with blood clotting too easily:        Skin    Rashes or ulcers:        Constitutional    Fever or chills:      PHYSICAL EXAM  There were no vitals filed for this visit.  Constitutional: *** appearing. *** distress. Appears *** nourished.  Neurologic: CN ***. *** focal findings. *** sensory loss. Psychiatric: Mood and affect symmetric and appropriate. Eyes: No icterus. No conjunctival pallor. Ears, nose, throat: mucous membranes moist. Midline trachea.  Cardiac: *** rate and rhythm.  Respiratory: unlabored. Abdominal: soft, non-tender, non-distended.  Peripheral vascular:  *** Extremity: No edema. No cyanosis. No pallor.  Skin: No gangrene. No ulceration.  Lymphatic: No Stemmer's sign. No palpable lymphadenopathy.  PERTINENT LABORATORY AND RADIOLOGIC DATA  Most recent CBC CBC Latest Ref Rng & Units 11/13/2020 11/10/2020 11/09/2020  WBC 4.0 - 10.5 K/uL 6.3 5.1 4.9  Hemoglobin 13.0 - 17.0 g/dL 8.1(L) 7.9(L) 8.6(L)  Hematocrit 39.0 - 52.0 % 26.7(L) 23.9(L) 27.5(L)  Platelets 150 - 400 K/uL 311 268 288     Most recent CMP CMP Latest Ref Rng & Units 11/13/2020 11/12/2020 11/11/2020  Glucose 70 - 99 mg/dL 81 78 81  BUN 6 - 20 mg/dL 54(H) 58(H) 59(H)  Creatinine 0.61 - 1.24 mg/dL 6.88(H) 6.77(H) 6.52(H)  Sodium 135 - 145 mmol/L 135 137 137  Potassium 3.5 - 5.1 mmol/L 4.4 4.3 4.1  Chloride 98 - 111 mmol/L 103 104 106  CO2 22 - 32 mmol/L 19(L) 19(L) 18(L)  Calcium 8.9 - 10.3 mg/dL 9.0 8.5(L) 8.0(L)  Total Protein 6.5 - 8.1 g/dL - - -  Total Bilirubin 0.3 - 1.2 mg/dL - - -  Alkaline Phos 38 - 126 U/L - - -  AST 15 - 41 U/L - - -  ALT 0 - 44 U/L - - -    Renal function CrCl  cannot be calculated (Patient's most recent lab result is older than the maximum 21 days allowed.).  Hemoglobin A1C (%)  Date Value  07/10/2020 5.0   HbA1c, POC (prediabetic range) (%)  Date Value  07/10/2020 5.0 (A)   HbA1c, POC (controlled diabetic range) (%)  Date Value  07/10/2020 5.0   HbA1c POC (<> result, manual entry) (%)  Date Value  07/10/2020 5.0    LDL Chol Calc (NIH)  Date Value Ref Range Status  03/17/2020 41 0 - 99 mg/dL Final   LDL Direct  Date Value Ref Range Status  03/17/2020 42 0 - 99 mg/dL Final     Vascular Imaging: *** I personally reviewed *** the above study. It is significant for ***.   Yevonne Aline. Stanford Breed, MD [facility] Phone Number: [phone] 01/24/2021 9:41 AM

## 2021-01-26 ENCOUNTER — Encounter: Payer: Medicaid Other | Admitting: Vascular Surgery

## 2021-01-27 ENCOUNTER — Encounter: Payer: Self-pay | Admitting: Vascular Surgery

## 2021-02-26 ENCOUNTER — Ambulatory Visit: Payer: Medicaid Other | Admitting: Family Medicine

## 2021-03-22 ENCOUNTER — Other Ambulatory Visit: Payer: Self-pay | Admitting: Cardiology

## 2021-03-22 DIAGNOSIS — I10 Essential (primary) hypertension: Secondary | ICD-10-CM

## 2021-03-28 NOTE — Progress Notes (Signed)
New Patient Office Visit  Subjective:  Patient ID: Stanley [name], male    DOB: 26-Dec-1975  Age: 45 y.o. MRN: [id number]  CC:  Chief Complaint  Patient presents with   New Patient (Initial Visit)   Testicle Pain   Insomnia    HPI Stanley [name] presents for PCP to est  This is a former patient care center patient.  Because his primary care provider left that practice he is transferring over to our site.  Note the patient was admitted in February with hypertensive crisis documented as below.  Note there is been multiple admissions over the past several years for hypertensive crisis.  Patient is followed by nephrology on a chronic basis.  His last visit was in December..  He has complaint of chronic testicular swelling and pain which has been going on for many years.  He has not seen urology previously.  He takes his blood pressure at home and it runs in the 150/85 range.  On arrival today is 167/87.  Patient has been followed by cardiology in the past for hypertension.  The patient currently is on amlodipine 10 mg daily, clonidine 0.1 mg twice daily, doxazosin 16 mg daily, furosemide 40 mg daily, labetalol 600 mg 3 times daily, hydralazine 100 mg 3 times daily.  Most recent lab data showed a creatinine greater than 6 mild elevation in potassium.  I do not have any records from nephrology.  He was offered a vascular access needed to refuse this.  He wishes to avoid dialysis at all cost.  Patient denies any dyspnea or chest pain.  He has been told he has had heart failure as well in the past.  Echocardiogram obtained in February of this year is as noted below  Former [facility] pcp patient PCP: [name], FNP   Admit date: 11/09/2020 Discharge date: 11/14/2020   Admitted From: Home Disposition: Home   Recommendations for Outpatient Follow-up:  Follow up with PCP in 1 week with repeat CBC/BMP Outpatient follow-up with nephrology in 1 week Comply with medications and follow-up Follow up in  ED if symptoms worsen or new appear     Home Health: No Equipment/Devices: None   Discharge Condition: Stable CODE STATUS: Full Diet recommendation: Heart healthy/fluid restriction of 1200 cc a day   Brief/Interim Summary: 45 year old male with history of difficult to control hypertension with prior admission for hypertensive crisis, medical noncompliance, CKD stage IV, obstructive sleep apnea, tobacco abuse, chronic diastolic CHF, anemia of chronic disease presented on 11/09/2020 with chest pressure, lower extremity edema and orthopnea and was found to have initial blood pressure of 242/126 and subsequently admitted to ICU on nicardipine drip.  On presentation, creatinine was 6.87 with a baseline creatinine in the range of 4.2-5, BNP of 3521 chest x-ray showed evolving pulmonary edema.  He was started on IV Lasix.  Nephrology was consulted.  Blood pressure has improved and he has been transferred out of ICU to [facility] service on 11/12/2020. Patient was offered AV fistula placement by nephrology but he refused. Nephrology has cleared the patient for discharge with outpatient follow-up with nephrology.  Discharge patient home today with outpatient follow-up PCP and nephrology   Discharge Diagnoses:    Hypertensive emergency: Improved Difficult to control hypertension Medical noncompliance -Presented with blood pressure of 242/126 and treated with nicardipine drip in ICU. - Blood pressure has improved but still intermittently elevated and he has been transferred out of ICU to [facility] on 11/12/2020. -Currently on amlodipine,  doxazosin, Lasix, hydralazine and labetalol.  Clonidine was added by nephrology on 11/13/2020.  All these medications will be continued upon discharge.  Nephrology has cleared the patient for discharge with outpatient follow-up with nephrology.  Blood pressure still on the higher side and needs to be closely followed up as an outpatient.  Explained the importance of compliance with  medications and follow-up   Acute kidney injury on chronic kidney disease stage IV Acute metabolic acidosis -Presented with creatinine of 6.87; baseline creatinine around 4.2-5.  Nephrology following and diuretics as per nephrology team.  Currently making urine.  Creatinine 6.88  on 11/13/2020.   Continue salt and fluid restriction -Continue sodium bicarbonate as per nephrology.   -Patient was offered AV fistula placement by nephrology but he refused. -Nephrology has cleared the patient for discharge with outpatient follow-up with nephrology.   Acute on chronic diastolic heart failure - Volume being managed by nephrology.  Continue Lasix, hydralazine and labetalol.  Outpatient follow-up with cardiology   Anemia of chronic disease -Hemoglobin stable.  Outpatient follow-up   [facility] -Outpatient follow-up with PCP.  Refusing CPAP at night for the last couple of nights  Patient has had diagnosed sleep apnea in the past however patient is refusing CPAP therapy  Patient supposed to be on aspirin as he has had prior decrease in vision with elevated blood pressures thought to have a mini stroke.  He is not been taking aspirin recently.  Patient was smoking heavily now is down to 2 to 3 cigarettes daily.  Past Medical History:  Diagnosis Date   Anxiety    Chronic kidney disease (CKD), stage V ([place]) 11/2020   CKD (chronic kidney disease), stage IV (H[facility])    Depression    Dyslexia    Heart palpitations    High cholesterol    Hypertension    Hypertensive urgency    Polysubstance (including opioids) dependence, binge pattern ([place]) 08/2020   Stroke ([place]) 06/03/2017   "minor; completely recovered today" (06/13/2017)   Tobacco abuse     Past Surgical History:  Procedure Laterality Date   NO PAST SURGERIES      Family History  Problem Relation Age of Onset   Hypertension Mother    Hypertension Brother    Diabetes type II Brother     Social History   Socioeconomic History   Marital  status: Married    Spouse name: Not on file   Number of children: Not on file   Years of education: Not on file   Highest education level: Not on file  Occupational History   Not on file  Tobacco Use   Smoking status: Former    Packs/day: 1.00    Years: 30.00    Pack years: 30.00    Types: Cigarettes   Smokeless tobacco: Never  Vaping Use   Vaping Use: Never used  Substance and Sexual Activity   Alcohol use: No   Drug use: Yes    Types: Marijuana    Comment: 06/13/2017 "not often"   Sexual activity: Not Currently  Other Topics Concern   Not on file  Social History Narrative   Not on file   Social Determinants of Health   Financial Resource Strain: Not on file  Food Insecurity: Not on file  Transportation Needs: Not on file  Physical Activity: Not on file  Stress: Not on file  Social Connections: Not on file  Intimate Partner Violence: Not on file    ROS Review of Systems  HENT: Negative.  Eyes: Negative.   Respiratory: Negative.    Cardiovascular: Negative.   Gastrointestinal: Negative.   Genitourinary:  Positive for scrotal swelling and testicular pain. Negative for difficulty urinating.  Musculoskeletal: Negative.   Skin: Negative.   Neurological:  Positive for headaches. Negative for tremors, seizures, syncope, speech difficulty, weakness and numbness.  Hematological: Negative.   Psychiatric/Behavioral: Negative.     Objective:   Today's Vitals: BP (!) 167/87   Pulse (!) 58   Resp 16   Ht 6' (1.829 m)   Wt 193 lb 9.6 oz (87.8 kg)   SpO2 100%   BMI 26.26 kg/m   Physical Exam Constitutional:      Appearance: Normal appearance. He is normal weight.  HENT:     Head: Normocephalic and atraumatic.     Nose: Nose normal.     Mouth/Throat:     Mouth: Mucous membranes are moist.     Pharynx: Oropharynx is clear.  Eyes:     Extraocular Movements: Extraocular movements intact.     Conjunctiva/sclera: Conjunctivae normal.  Cardiovascular:     Rate  and Rhythm: Normal rate and regular rhythm.     Pulses: Normal pulses.     Heart sounds: Normal heart sounds.  Pulmonary:     Effort: Pulmonary effort is normal.     Breath sounds: Normal breath sounds.  Abdominal:     General: Abdomen is flat. Bowel sounds are normal. There is no distension.     Palpations: Abdomen is soft. There is no mass.     Tenderness: There is no abdominal tenderness. There is no rebound.     Hernia: No hernia is present.  Genitourinary:    Penis: Normal.      Comments: Scrotal edema severe, no other signs of edema Musculoskeletal:        General: Normal range of motion.     Cervical back: Normal range of motion and neck supple.  Skin:    General: Skin is warm and dry.  Neurological:     General: No focal deficit present.     Mental Status: He is alert and oriented to person, place, and time. Mental status is at baseline.  Psychiatric:        Mood and Affect: Mood normal.        Behavior: Behavior normal.        Thought Content: Thought content normal.        Judgment: Judgment normal.   11/2020 Echo Left ventricular ejection fraction, by estimation, is 65 to 70%. The left ventricle has normal function. The left ventricle has no regional wall motion abnormalities. There is severe left ventricular hypertrophy. Left ventricular diastolic parameters are consistent with Grade I diastolic dysfunction (impaired relaxation). 2. Right ventricular systolic function is normal. The right ventricular size is normal. There is mildly elevated pulmonary artery systolic pressure. The estimated right ventricular systolic pressure is [id number] mmHg. 3. Left atrial size was severely dilated. 4. Right atrial size was mildly dilated. 5. The mitral valve is normal in structure. Trivial mitral valve regurgitation. No evidence of mitral stenosis. 6. The aortic valve is tricuspid. Aortic valve regurgitation is mild. No aortic stenosis is present. 7. Aortic dilatation noted. There is  mild dilatation of the ascending aorta, measuring 38 mm. 8. The inferior vena cava is dilated in size with >50% respiratory variability, suggesting right atrial pressure of 8 mmHg. 9. A small pericardial effusion is present. Assessment & Plan:   Problem List Items Addressed This Visit  Cardiovascular and Mediastinum   Uncontrolled hypertension - Primary    Hypertension not at goal however I am not going to make changes without obtaining further information from nephrology and also having another opinion from cardiology with regards to his blood pressure control  I went over with the patient a low-salt diet and other measures to improve his blood pressure control recommended further smoking cessation       Relevant Medications   aspirin EC 81 MG tablet   atorvastatin (LIPITOR) 40 MG tablet   cloNIDine (CATAPRES) 0.1 MG tablet   furosemide (LASIX) 20 MG tablet   Other Relevant Orders   Ambulatory referral to [facility] [place]   Comprehensive metabolic panel   CBC with Differential/Platelet   Ambulatory referral to Nephrology   RAS (renal artery stenosis) (HCC)   Relevant Medications   aspirin EC 81 MG tablet   atorvastatin (LIPITOR) 40 MG tablet   cloNIDine (CATAPRES) 0.1 MG tablet   furosemide (LASIX) 20 MG tablet     Respiratory   Observed sleep apnea    Like to get a sleep study on this patient but need to have nephrology opinion first         Endocrine   Hyperparathyroidism, secondary renal ([place])     Genitourinary   CKD stage 4 secondary to hypertension ([place])    Refer back to nephrology and obtain renal function this visit       Relevant Orders   Comprehensive metabolic panel   Ambulatory referral to Nephrology   Hydrocele    Bilateral hydroceles which are chronic and basis and getting worse will refer to urology         Other   Tobacco abuse       Current smoking consumption amount: 3-5 cig/d  Dicsussion on  advise to quit smoking and smoking impacts: cv impacts  Patient's willingness to quit:  Trying to quit  Methods to quit smoking discussed:  behav mod  Medication management of smoking session drugs discussed:not a candidate  Resources provided:  AVS   Setting quit date not established  Follow-up arranged  2 mo   Time spent counseling the patient:  5 min        Swollen testicle    As per [employer] assessment       RESOLVED: Pain in both testicles   Relevant Orders   Ambulatory referral to Urology   Other Visit Diagnoses     Chronic congestive heart failure, unspecified heart failure type (HCC)       Relevant Medications   aspirin EC 81 MG tablet   atorvastatin (LIPITOR) 40 MG tablet   cloNIDine (CATAPRES) 0.1 MG tablet   furosemide (LASIX) 20 MG tablet   Nausea       Relevant Medications   ondansetron (ZOFRAN ODT) 4 MG disintegrating tablet   Scrotal edema       Relevant Orders   Ambulatory referral to Urology   Lipid screening       Relevant Orders   Lipid panel   Need for hepatitis C screening test       Relevant Orders   HCV Ab w Reflex to Quant PCR       Outpatient Encounter Medications as of 03/29/2021  Medication Sig   acetaminophen (TYLENOL) 500 MG tablet Take 1,000 mg by mouth as needed for mild pain.   albuterol (PROAIR HFA) 108 (90 Base) MCG/ACT inhaler Inhale 1-2 puffs into the lungs every 6 (six)  hours as needed for wheezing or shortness of breath. (Patient taking differently: Inhale 1-2 puffs into the lungs as needed for wheezing or shortness of breath.)   amLODipine (NORVASC) 10 MG tablet Take 1 tablet (10 mg total) by mouth daily.   doxazosin (CARDURA) 8 MG tablet Take 2 tablets (16 mg total) by mouth daily.   folic acid (FOLVITE) Q000111Q MCG tablet Take 0.5 tablets (400 mcg total) by mouth daily.   hydrALAZINE (APRESOLINE) 100 MG tablet TAKE 1 TABLET BY MOUTH THREE TIMES DAILY   labetalol (NORMODYNE) 200 MG tablet Take 3 tablets (600 mg total) by  mouth 3 (three) times daily.   Multiple Vitamin (MULTIVITAMIN WITH MINERALS) TABS tablet Take 1 tablet by mouth daily.   [DISCONTINUED] atorvastatin (LIPITOR) 40 MG tablet Take 1 tablet (40 mg total) by mouth daily. Please keep upcoming appt in June with Dr. Marlou Porch before anymore refills. Thank you   [DISCONTINUED] cloNIDine (CATAPRES) 0.1 MG tablet Take 1 tablet (0.1 mg total) by mouth 2 (two) times daily.   [DISCONTINUED] furosemide (LASIX) 20 MG tablet Take 2 tablets (40 mg total) by mouth daily.   aspirin EC 81 MG tablet Take 1 tablet (81 mg total) by mouth daily.   atorvastatin (LIPITOR) 40 MG tablet Take 1 tablet (40 mg total) by mouth daily.   cloNIDine (CATAPRES) 0.1 MG tablet Take 1 tablet (0.1 mg total) by mouth 2 (two) times daily.   furosemide (LASIX) 20 MG tablet Take 2 tablets (40 mg total) by mouth daily.   ondansetron (ZOFRAN ODT) 4 MG disintegrating tablet Take 1 tablet (4 mg total) by mouth every 8 (eight) hours as needed for nausea or vomiting.   PSYLLIUM PO Take 1 tablet by mouth daily. (Patient not taking: Reported on 03/29/2021)   [DISCONTINUED] aspirin EC 81 MG tablet Take 81 mg by mouth daily. (Patient not taking: Reported on 03/29/2021)   [DISCONTINUED] ondansetron (ZOFRAN ODT) 4 MG disintegrating tablet Take 1 tablet (4 mg total) by mouth every 8 (eight) hours as needed for nausea or vomiting. (Patient not taking: Reported on 03/29/2021)   No facility-administered encounter medications on file as of 03/29/2021.    Follow-up: Return in about 3 months (around 06/29/2021).   Asencion Noble, MD

## 2021-03-29 ENCOUNTER — Other Ambulatory Visit: Payer: Self-pay

## 2021-03-29 ENCOUNTER — Ambulatory Visit: Payer: Medicaid Other | Attending: Critical Care Medicine | Admitting: Critical Care Medicine

## 2021-03-29 ENCOUNTER — Encounter: Payer: Self-pay | Admitting: Critical Care Medicine

## 2021-03-29 VITALS — BP 167/87 | HR 58 | Resp 16 | Ht 72.0 in | Wt 193.6 lb

## 2021-03-29 DIAGNOSIS — I5033 Acute on chronic diastolic (congestive) heart failure: Secondary | ICD-10-CM | POA: Insufficient documentation

## 2021-03-29 DIAGNOSIS — N2581 Secondary hyperparathyroidism of renal origin: Secondary | ICD-10-CM

## 2021-03-29 DIAGNOSIS — F17210 Nicotine dependence, cigarettes, uncomplicated: Secondary | ICD-10-CM | POA: Insufficient documentation

## 2021-03-29 DIAGNOSIS — Z9114 Patient's other noncompliance with medication regimen: Secondary | ICD-10-CM | POA: Insufficient documentation

## 2021-03-29 DIAGNOSIS — Z720 Tobacco use: Secondary | ICD-10-CM

## 2021-03-29 DIAGNOSIS — I509 Heart failure, unspecified: Secondary | ICD-10-CM | POA: Diagnosis not present

## 2021-03-29 DIAGNOSIS — N184 Chronic kidney disease, stage 4 (severe): Secondary | ICD-10-CM

## 2021-03-29 DIAGNOSIS — Z8673 Personal history of transient ischemic attack (TIA), and cerebral infarction without residual deficits: Secondary | ICD-10-CM | POA: Diagnosis not present

## 2021-03-29 DIAGNOSIS — Z1159 Encounter for screening for other viral diseases: Secondary | ICD-10-CM

## 2021-03-29 DIAGNOSIS — N50811 Right testicular pain: Secondary | ICD-10-CM | POA: Diagnosis not present

## 2021-03-29 DIAGNOSIS — R110 Nausea: Secondary | ICD-10-CM | POA: Diagnosis not present

## 2021-03-29 DIAGNOSIS — I10 Essential (primary) hypertension: Secondary | ICD-10-CM

## 2021-03-29 DIAGNOSIS — Z7982 Long term (current) use of aspirin: Secondary | ICD-10-CM | POA: Insufficient documentation

## 2021-03-29 DIAGNOSIS — E872 Acidosis: Secondary | ICD-10-CM | POA: Insufficient documentation

## 2021-03-29 DIAGNOSIS — I351 Nonrheumatic aortic (valve) insufficiency: Secondary | ICD-10-CM | POA: Diagnosis not present

## 2021-03-29 DIAGNOSIS — G4730 Sleep apnea, unspecified: Secondary | ICD-10-CM

## 2021-03-29 DIAGNOSIS — J90 Pleural effusion, not elsewhere classified: Secondary | ICD-10-CM | POA: Insufficient documentation

## 2021-03-29 DIAGNOSIS — N185 Chronic kidney disease, stage 5: Secondary | ICD-10-CM | POA: Diagnosis not present

## 2021-03-29 DIAGNOSIS — Z79899 Other long term (current) drug therapy: Secondary | ICD-10-CM | POA: Insufficient documentation

## 2021-03-29 DIAGNOSIS — N179 Acute kidney failure, unspecified: Secondary | ICD-10-CM | POA: Insufficient documentation

## 2021-03-29 DIAGNOSIS — I130 Hypertensive heart and chronic kidney disease with heart failure and stage 1 through stage 4 chronic kidney disease, or unspecified chronic kidney disease: Secondary | ICD-10-CM | POA: Insufficient documentation

## 2021-03-29 DIAGNOSIS — N433 Hydrocele, unspecified: Secondary | ICD-10-CM | POA: Diagnosis not present

## 2021-03-29 DIAGNOSIS — G4733 Obstructive sleep apnea (adult) (pediatric): Secondary | ICD-10-CM | POA: Insufficient documentation

## 2021-03-29 DIAGNOSIS — Z13220 Encounter for screening for lipoid disorders: Secondary | ICD-10-CM

## 2021-03-29 DIAGNOSIS — E7800 Pure hypercholesterolemia, unspecified: Secondary | ICD-10-CM | POA: Insufficient documentation

## 2021-03-29 DIAGNOSIS — N50812 Left testicular pain: Secondary | ICD-10-CM | POA: Diagnosis not present

## 2021-03-29 DIAGNOSIS — N5089 Other specified disorders of the male genital organs: Secondary | ICD-10-CM | POA: Insufficient documentation

## 2021-03-29 DIAGNOSIS — Z8249 Family history of ischemic heart disease and other diseases of the circulatory system: Secondary | ICD-10-CM | POA: Insufficient documentation

## 2021-03-29 DIAGNOSIS — D638 Anemia in other chronic diseases classified elsewhere: Secondary | ICD-10-CM | POA: Diagnosis not present

## 2021-03-29 DIAGNOSIS — I701 Atherosclerosis of renal artery: Secondary | ICD-10-CM

## 2021-03-29 DIAGNOSIS — I129 Hypertensive chronic kidney disease with stage 1 through stage 4 chronic kidney disease, or unspecified chronic kidney disease: Secondary | ICD-10-CM

## 2021-03-29 MED ORDER — ONDANSETRON 4 MG PO TBDP
4.0000 mg | ORAL_TABLET | Freq: Three times a day (TID) | ORAL | 3 refills | Status: DC | PRN
Start: 2021-03-29 — End: 2022-05-21

## 2021-03-29 MED ORDER — ASPIRIN EC 81 MG PO TBEC: 81.0000 mg | DELAYED_RELEASE_TABLET | Freq: Every day | ORAL | 1 refills | Status: DC

## 2021-03-29 MED ORDER — FUROSEMIDE 20 MG PO TABS: 40.0000 mg | ORAL_TABLET | Freq: Every day | ORAL | 0 refills | Status: DC

## 2021-03-29 MED ORDER — CLONIDINE HCL 0.1 MG PO TABS: 0.1000 mg | ORAL_TABLET | Freq: Two times a day (BID) | ORAL | 0 refills | Status: DC

## 2021-03-29 MED ORDER — ATORVASTATIN CALCIUM 40 MG PO TABS
40.0000 mg | ORAL_TABLET | Freq: Every day | ORAL | 11 refills | Status: DC
Start: 2021-03-29 — End: 2022-03-30

## 2021-03-29 NOTE — Assessment & Plan Note (Signed)
Like to get a sleep study on this patient but need to have nephrology opinion first

## 2021-03-29 NOTE — Patient Instructions (Signed)
Referral to kidney doctor will be made  Referral to cardiology to evaluate your hypertension will be made  Referral to urology was made because of the swelling testicles  No change in medications refill sent to your pharmacy  To help with sleep try melatonin 10 mg an hour before bedtime  Complete set of lab screenings were obtained today including hepatitis C screen  Continue to work on smoking cessation as we discussed  Return to see Dr. Joya Gaskins 3 months

## 2021-03-29 NOTE — Assessment & Plan Note (Signed)
  .   Current smoking consumption amount: 3-5 cig/d  . Dicsussion on advise to quit smoking and smoking impacts: cv impacts  . Patient's willingness to quit:  Trying to quit  . Methods to quit smoking discussed:  behav mod  . Medication management of smoking session drugs discussed:not a candidate  . Resources provided:  AVS   . Setting quit date not established  . Follow-up arranged  2 mo   Time spent counseling the patient:  5 min

## 2021-03-29 NOTE — Assessment & Plan Note (Signed)
Refer back to nephrology and obtain renal function this visit

## 2021-03-29 NOTE — Assessment & Plan Note (Signed)
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

## 2021-03-29 NOTE — Assessment & Plan Note (Signed)
Hypertension not at goal however I am not going to make changes without obtaining further information from nephrology and also having another opinion from cardiology with regards to his blood pressure control  I went over with the patient a low-salt diet and other measures to improve his blood pressure control recommended further smoking cessation

## 2021-03-29 NOTE — Progress Notes (Signed)
Pt states he has been having testicular pain for 10 years  Pt mother states he was misdiagnosed for blue balls  Pt states his pain effects his bp

## 2021-03-29 NOTE — Assessment & Plan Note (Signed)
As per HydroSil assessment

## 2021-03-30 LAB — COMPREHENSIVE METABOLIC PANEL
ALT: 5 IU/L (ref 0–44)
AST: 7 IU/L (ref 0–40)
Albumin/Globulin Ratio: 2.1 (ref 1.2–2.2)
Albumin: 4.6 g/dL (ref 4.0–5.0)
Alkaline Phosphatase: 103 IU/L (ref 44–121)
BUN/Creatinine Ratio: 8 — ABNORMAL LOW (ref 9–20)
BUN: 71 mg/dL — ABNORMAL HIGH (ref 6–24)
Bilirubin Total: 0.4 mg/dL (ref 0.0–1.2)
CO2: 16 mmol/L — ABNORMAL LOW (ref 20–29)
Calcium: 8.7 mg/dL (ref 8.7–10.2)
Chloride: 105 mmol/L (ref 96–106)
Creatinine, Ser: 9.02 mg/dL — ABNORMAL HIGH (ref 0.76–1.27)
Globulin, Total: 2.2 g/dL (ref 1.5–4.5)
Glucose: 84 mg/dL (ref 65–99)
Potassium: 5 mmol/L (ref 3.5–5.2)
Sodium: 141 mmol/L (ref 134–144)
Total Protein: 6.8 g/dL (ref 6.0–8.5)
eGFR: 7 mL/min/{1.73_m2} — ABNORMAL LOW (ref >59)

## 2021-03-30 LAB — CBC WITH DIFFERENTIAL/PLATELET
Basophils Absolute: 0 10*3/uL (ref 0.0–0.2)
Basos: 1 %
EOS (ABSOLUTE): 0.3 10*3/uL (ref 0.0–0.4)
Eos: 6 %
Hematocrit: 30.6 % — ABNORMAL LOW (ref 37.5–51.0)
Hemoglobin: 10.1 g/dL — ABNORMAL LOW (ref 13.0–17.7)
Immature Grans (Abs): 0 10*3/uL (ref 0.0–0.1)
Immature Granulocytes: 0 %
Lymphocytes Absolute: 0.7 10*3/uL (ref 0.7–3.1)
Lymphs: 16 %
MCH: 29.8 pg (ref 26.6–33.0)
MCHC: 33 g/dL (ref 31.5–35.7)
MCV: 90 fL (ref 79–97)
Monocytes Absolute: 0.4 10*3/uL (ref 0.1–0.9)
Monocytes: 9 %
Neutrophils Absolute: 2.6 10*3/uL (ref 1.4–7.0)
Neutrophils: 68 %
Platelets: 195 10*3/uL (ref 150–450)
RBC: 3.39 x10E6/uL — ABNORMAL LOW (ref 4.14–5.80)
RDW: 15.8 % — ABNORMAL HIGH (ref 11.6–15.4)
WBC: 4 10*3/uL (ref 3.4–10.8)

## 2021-03-30 LAB — HCV AB W REFLEX TO QUANT PCR: HCV Ab: <0.1 s/co ratio (ref 0.0–0.9)

## 2021-03-30 LAB — LIPID PANEL
Chol/HDL Ratio: 1.9 ratio (ref 0.0–5.0)
Cholesterol, Total: 76 mg/dL — ABNORMAL LOW (ref 100–199)
HDL: 40 mg/dL (ref >39)
LDL Chol Calc (NIH): 25 mg/dL (ref 0–99)
Triglycerides: 37 mg/dL (ref 0–149)
VLDL Cholesterol Cal: 11 mg/dL (ref 5–40)

## 2021-03-30 LAB — HCV INTERPRETATION

## 2021-04-02 ENCOUNTER — Ambulatory Visit: Payer: Medicaid Other | Admitting: Nurse Practitioner

## 2021-04-08 ENCOUNTER — Other Ambulatory Visit: Payer: Self-pay | Admitting: Cardiology

## 2021-04-08 DIAGNOSIS — I10 Essential (primary) hypertension: Secondary | ICD-10-CM

## 2021-05-15 ENCOUNTER — Other Ambulatory Visit: Payer: Self-pay | Admitting: Nurse Practitioner

## 2021-05-15 MED ORDER — TRAZODONE HCL 100 MG PO TABS: 100.0000 mg | ORAL_TABLET | Freq: Every day | ORAL | 0 refills | Status: DC

## 2021-05-19 ENCOUNTER — Telehealth: Payer: Self-pay | Admitting: Cardiology

## 2021-05-19 DIAGNOSIS — I10 Essential (primary) hypertension: Secondary | ICD-10-CM

## 2021-05-19 MED ORDER — HYDRALAZINE HCL 100 MG PO TABS: 100.0000 mg | ORAL_TABLET | Freq: Three times a day (TID) | ORAL | 0 refills | Status: DC

## 2021-05-19 NOTE — Telephone Encounter (Signed)
*  STAT* If patient is at the pharmacy, call can be transferred to refill team.   1. Which medications need to be refilled? (please list name of each medication and dose if known) Hydralazine   2. Which pharmacy/location (including street and city if local pharmacy) is medication to be sent to? [facility]  3. Do they need a 30 day or 90 day supply? 90     Patient has up coming appt.

## 2021-05-25 ENCOUNTER — Ambulatory Visit: Payer: Self-pay | Admitting: *Deleted

## 2021-05-25 NOTE — Telephone Encounter (Signed)
C/o headache, bleeding from left inner ear last night. No bleeding at this time. Denies headache at this time. B/P prior to call obtained by patient's mother for 199/111 HR 70. C/o difficulty sleeping, headache noted to back of head on left side behind ear on and off for a year now. C/o nausea, chest pain some difficulty breathing at times not now. Reports dizziness when bending over and chest pain lasting a few seconds with heart irregular beats after bending over. Denies decrease in balance or visual disturbances, no speech issues. Rechecked B/P at 1340 for 187/110 HR 68 while at rest. PCP practice not open today . Patient reports he has taken hypertensive medications today and B/P remains elevated. Instructed patient to get evaluated in ED due to consistent elevated B/P and headaches and bleeding from left ear last night. Care advise given. Patient verbalized understanding of care advise and to go to ED now or call 911 if symptoms worsen.

## 2021-05-25 NOTE — Telephone Encounter (Signed)
Reason for Disposition  Systolic BP  >= 99991111 OR Diastolic >= A999333  Answer Assessment - Initial Assessment Questions 1. BLOOD PRESSURE: "What is the blood pressure?" "Did you take at least two measurements 5 minutes apart?"     199/111 HR 70 prior to call. 1340 rechecked B/P 187/110 HR 68 2. ONSET: "When did you take your blood pressure?"     Prior to call  3. HOW: "How did you obtain the blood pressure?" (e.g., visiting nurse, automatic home BP monitor)    Home monitor  4. HISTORY: "Do you have a history of high blood pressure?"     Yes  5. MEDICATIONS: "Are you taking any medications for blood pressure?" "Have you missed any doses recently?"     Yes taking medications  6. OTHER SYMPTOMS: "Do you have any symptoms?" (e.g., headache, chest pain, blurred vision, difficulty breathing, weakness)     Headache, bleeding noted from left inner ear.  7. PREGNANCY: "Is there any chance you are pregnant?" "When was your last menstrual period?"     na  Protocols used: Blood Pressure - High-A-AH

## 2021-05-26 ENCOUNTER — Telehealth: Payer: Self-pay | Admitting: Critical Care Medicine

## 2021-05-26 NOTE — Telephone Encounter (Signed)
I tried to call the patient, no answer,  he did not appear to go to ED last night.  Needs ED or UC or MMU today.

## 2021-05-27 ENCOUNTER — Other Ambulatory Visit: Payer: Self-pay

## 2021-05-27 DIAGNOSIS — I129 Hypertensive chronic kidney disease with stage 1 through stage 4 chronic kidney disease, or unspecified chronic kidney disease: Secondary | ICD-10-CM

## 2021-05-28 ENCOUNTER — Other Ambulatory Visit: Payer: Self-pay | Admitting: Cardiology

## 2021-05-28 DIAGNOSIS — I10 Essential (primary) hypertension: Secondary | ICD-10-CM

## 2021-05-29 ENCOUNTER — Other Ambulatory Visit: Payer: Self-pay | Admitting: Cardiology

## 2021-05-29 DIAGNOSIS — I10 Essential (primary) hypertension: Secondary | ICD-10-CM

## 2021-06-04 ENCOUNTER — Other Ambulatory Visit: Payer: Self-pay | Admitting: Cardiology

## 2021-06-04 DIAGNOSIS — I10 Essential (primary) hypertension: Secondary | ICD-10-CM

## 2021-06-08 ENCOUNTER — Encounter (HOSPITAL_COMMUNITY): Payer: Medicaid Other

## 2021-06-08 ENCOUNTER — Encounter: Payer: Medicaid Other | Admitting: Vascular Surgery

## 2021-06-08 ENCOUNTER — Other Ambulatory Visit (HOSPITAL_COMMUNITY): Payer: Medicaid Other

## 2021-06-10 ENCOUNTER — Telehealth: Payer: Self-pay | Admitting: Cardiology

## 2021-06-10 NOTE — Telephone Encounter (Signed)
Stanley [name] from [facility] states patients mother called in earlier, about son needing opinion  from nephroglogist for a cpap machine. She says the whole coversation was all over the place and is trying to get resolved. Please call patient back

## 2021-06-10 NOTE — Telephone Encounter (Signed)
Patient's mother is requesting to speak with Dr. Marlou Porch' nurse. She states the patient is beginning dialysis soon. She states she also has concerns regarding his CPAP machine. Please return call to discuss.

## 2021-06-10 NOTE — Telephone Encounter (Signed)
Returned call to Pt's mother.  Mother has difficulty breathing, and was difficult to understand what she was calling in regards to, but her issue seemed to be that her son has a CPap and possibly needed adjustment?  Pt is not seen by [facility] for sleep apnea.  Upon further review after disconnecting phone call with family, appears Pt sees Dr. Joya Gaskins with [facility] for sleep apnea.  Per Dr. Bettina Gavia last note- "observed sleep apnea-like to get a sleep study on this patient but need to have nephrology opinion first"  Call placed to [facility] to advise of Pt's call and need for assistance with sleep apnea/Cpap.  Message will be sent to Dr. Joya Gaskins for further assistance.

## 2021-06-11 NOTE — Telephone Encounter (Signed)
Will route to PCP for review. 

## 2021-06-14 NOTE — Telephone Encounter (Signed)
Pt has an upcoming appointment on 06/29/2021 does he need to be seen before then.

## 2021-06-14 NOTE — Telephone Encounter (Signed)
Sept 27 is fine

## 2021-06-14 NOTE — Telephone Encounter (Signed)
Two items:  I have not had a chance to see this pt since 2020,  he missed his last appt.  I need to have him see me in clinic soon, ok to double book  The pt needs his renal status optimized before cpap can be adjusted which is why I asked for renal opinion

## 2021-06-14 NOTE — Telephone Encounter (Signed)
Noted  

## 2021-06-29 ENCOUNTER — Ambulatory Visit: Payer: Medicaid Other | Admitting: Critical Care Medicine

## 2021-07-15 ENCOUNTER — Ambulatory Visit: Payer: Medicaid Other | Admitting: Cardiology

## 2021-07-15 NOTE — Progress Notes (Incomplete)
Cardiology Office Note:    Date:  07/15/2021   ID:  Stanley [name], DOB [birth year], MRN [id number]  PCP:  Elsie Stain, MD  Cardiologist:  Candee Furbish, MD  Electrophysiologist:  None   Referring MD: Elsie Stain, MD     History of Present Illness:    Stanley [name] is a 45 y.o. male here today for follow-up of his hypertension.  Back in 2017 he was admitted with shortness of breath and hypertensive emergency with acute kidney injury.  Severe concentric hypertrophy was normal ejection fraction was noted.  Creatinine 3.9.  Patient's mother [name] was called secondary to recent chest pain.  It was constant for 2 days center chest with no radiation but some shortness of breath.  He did not sleep for 2 days and had not been eating well.  Ibuprofen seemed to help a little bit but then the pain resolved on its own.  He had been drinking banana and blueberry smoothies.  In his previous visit he stated he is not having any chest discomfort.  He did feel some shortness of breath with activity but this was not severe and was quite depressed about not working. His blood pressure was also being monitored and controlled by Dr. [name] of nephrology and he was on both labetalol and Toprol at the time. He was concerned about working throughout the day taking clonidine 0.3, 3 times a day.    Today: On 11/09/2020 he was admitted to the hospital for a hypertensive emergency.   He denies any palpitations, chest pain, or shortness of breath. No lightheadedness, headaches, syncope, orthopnea, or PND. Also has no lower extremity edema or exertional symptoms.  (+)  Past Medical History:  Diagnosis Date   Anxiety    Chronic kidney disease (CKD), stage V ([place]) 11/2020   CKD (chronic kidney disease), stage IV ([facility])    Depression    Dyslexia    Heart palpitations    High cholesterol    Hypertension    Hypertensive urgency    Polysubstance (including opioids) dependence, binge pattern ([place]) 08/2020    Stroke ([place]) 06/03/2017   "minor; completely recovered today" (06/13/2017)   Tobacco abuse     Past Surgical History:  Procedure Laterality Date   NO PAST SURGERIES      Current Medications: No outpatient medications have been marked as taking for the 07/15/21 encounter (Appointment) with Jerline Pain, MD.     Allergies:   Lactose intolerance (gi), Nitroglycerin, Other, and Isosorbide   Social History   Socioeconomic History   Marital status: Married    Spouse name: Not on file   Number of children: Not on file   Years of education: Not on file   Highest education level: Not on file  Occupational History   Not on file  Tobacco Use   Smoking status: Former    Packs/day: 1.00    Years: 30.00    Pack years: 30.00    Types: Cigarettes   Smokeless tobacco: Never  Vaping Use   Vaping Use: Never used  Substance and Sexual Activity   Alcohol use: No   Drug use: Yes    Types: Marijuana    Comment: 06/13/2017 "not often"   Sexual activity: Not Currently  Other Topics Concern   Not on file  Social History Narrative   Not on file   Social Determinants of Health   Financial Resource Strain: Not on file  Food Insecurity: Not on file  Transportation  Needs: Not on file  Physical Activity: Not on file  Stress: Not on file  Social Connections: Not on file     Family History: The patient's family history includes Diabetes type II in his brother; Hypertension in his brother and mother.  ROS:   Please see the history of present illness.     All other systems reviewed and are negative.  EKGs/Labs/Other Studies Reviewed:    The following studies were reviewed today:  Echo 11/09/2020 1. Left ventricular ejection fraction, by estimation, is 65 to 70%. The  left ventricle has normal function. The left ventricle has no regional  wall motion abnormalities. There is severe left ventricular hypertrophy.  Left ventricular diastolic parameters   are consistent with Grade I  diastolic dysfunction (impaired relaxation).   2. Right ventricular systolic function is normal. The right ventricular  size is normal. There is mildly elevated pulmonary artery systolic  pressure. The estimated right ventricular systolic pressure is [id number] mmHg.   3. Left atrial size was severely dilated.   4. Right atrial size was mildly dilated.   5. The mitral valve is normal in structure. Trivial mitral valve  regurgitation. No evidence of mitral stenosis.   6. The aortic valve is tricuspid. Aortic valve regurgitation is mild. No  aortic stenosis is present.   7. Aortic dilatation noted. There is mild dilatation of the ascending  aorta, measuring 38 mm.   8. The inferior vena cava is dilated in size with >50% respiratory  variability, suggesting right atrial pressure of 8 mmHg.   9. A small pericardial effusion is present.   Echo 08/24/2019 1. Left ventricular ejection fraction, by visual estimation, is 65 to  70%. The left ventricle has hyperdynamic function. There is severely  increased left ventricular hypertrophy.   2. Elevated left atrial pressure.   3. Left ventricular diastolic parameters are consistent with Grade II  diastolic dysfunction (pseudonormalization).   4. The left ventricle has no regional wall motion abnormalities.   5. Global right ventricle has normal systolic function.The right  ventricular size is normal. Mildly increased right ventricular wall  thickness.   6. Left atrial size was moderately dilated.   7. Right atrial size was mildly dilated.   8. The pericardial effusion is circumferential.   9. Trivial pericardial effusion is present.  10. The mitral valve is normal in structure. No evidence of mitral valve  regurgitation.  11. The tricuspid valve is normal in structure. Tricuspid valve  regurgitation is trivial.  12. Aortic valve regurgitation is mild to moderate.  13. The aortic valve is tricuspid. Aortic valve regurgitation is mild to  moderate.  No evidence of aortic valve sclerosis or stenosis.  14. The pulmonic valve was grossly normal. Pulmonic valve regurgitation is  not visualized.  15. Mildly elevated pulmonary artery systolic pressure.  16. The tricuspid regurgitant velocity is 2.76 m/s, and with an assumed  right atrial pressure of 8 mmHg, the estimated right ventricular systolic  pressure is mildly elevated at 38.5 mmHg.  17. The inferior vena cava is dilated in size with >50% respiratory  variability, suggesting right atrial pressure of 8 mmHg.   EKG:  EKG is not ordered today.   Recent Labs: 11/09/2020: B Natriuretic Peptide 3,521.3; Magnesium 1.6 03/29/2021: ALT 5; BUN 71; Creatinine, Ser 9.02; Hemoglobin 10.1; Platelets 195; Potassium 5.0; Sodium 141  Recent Lipid Panel    Component Value Date/Time   CHOL 76 (L) 03/29/2021 1543   TRIG 37 03/29/2021 1543  HDL 40 03/29/2021 1543   CHOLHDL 1.9 03/29/2021 1543   CHOLHDL 4.3 09/27/2016 0301   VLDL 14 09/27/2016 0301   LDLCALC 25 03/29/2021 1543   LDLDIRECT 42 03/17/2020 1527    Physical Exam:    VS:  There were no vitals taken for this visit.    Wt Readings from Last 3 Encounters:  03/29/21 193 lb 9.6 oz (87.8 kg)  11/13/20 186 lb 8.2 oz (84.6 kg)  08/10/20 207 lb 12.8 oz (94.3 kg)     GEN:  Well nourished, well developed in no acute distress HEENT: Normal NECK: No JVD; No carotid bruits LYMPHATICS: No lymphadenopathy CARDIAC: RRR, no murmurs, rubs, gallops RESPIRATORY:  Clear to auscultation without rales, wheezing or rhonchi  ABDOMEN: Soft, non-tender, non-distended MUSCULOSKELETAL:  No edema; No deformity  SKIN: Warm and dry NEUROLOGIC:  Alert and oriented x 3 PSYCHIATRIC:  Normal affect , somber  ASSESSMENT:    No diagnosis found.  PLAN:    In order of problems listed above: No problem-specific Assessment & Plan notes found for this encounter.   Difficult to control hypertension - Under better control with current regimen.  Appreciate  Dr. Elza Rafter work.   Shortness of breath -Echocardiogram with normal ejection fraction.  LVH quite severe.  Likely playing a role in his shortness of breath.  Continue with blood pressure control, walking, exercise.  Since his blood pressure is under better control, I would not have a problem with him going back to work.  I would like for him to consult with Dr. Macario Golds as well.  He is worried about feeling tired with clonidine.  Follow up: *** months  Medication Adjustments/Labs and Tests Ordered: Current medicines are reviewed at length with the patient today.  Concerns regarding medicines are outlined above.  No orders of the defined types were placed in this encounter.  No orders of the defined types were placed in this encounter.   There are no Patient Instructions on file for this visit.    I,Mykaella Javier,acting as a scribe for [employer], MD.,have documented all relevant documentation on the behalf of Candee Furbish, MD,as directed by  Candee Furbish, MD while in the presence of Candee Furbish, MD.  ***  Signed, Orion Crook  07/15/2021 4:03 PM    [place]

## 2021-08-02 ENCOUNTER — Other Ambulatory Visit: Payer: Self-pay | Admitting: Critical Care Medicine

## 2021-08-02 MED ORDER — TRAZODONE HCL 100 MG PO TABS: 100.0000 mg | ORAL_TABLET | Freq: Every day | ORAL | 0 refills | Status: DC

## 2021-08-02 NOTE — Telephone Encounter (Signed)
Requested Prescriptions  Pending Prescriptions Disp Refills  . traZODone (DESYREL) 100 MG tablet 30 tablet 0    Sig: Take 1 tablet (100 mg total) by mouth at bedtime.     Psychiatry: Antidepressants - Serotonin Modulator Passed - 08/02/2021  3:56 PM      Passed - Valid encounter within last 6 months    Recent Outpatient Visits          4 months ago Uncontrolled hypertension   [place], [place]   4 years ago Cough   [place], [place], [place]   4 years ago Essential hypertension   [place], RPH-CPP   4 years ago Essential hypertension   [place], [place]      Future Appointments            In 1 month Joya Gaskins Burnett Harry, MD [place]

## 2021-08-02 NOTE — Telephone Encounter (Signed)
Medication Refill - Medication:  traZODone (DESYREL) 100 MG tablet Has the patient contacted their pharmacy? Yes.   (Agent: If no, request that the patient contact the pharmacy for the refill. If patient does not wish to contact the pharmacy document the reason why and proceed with request.) (Agent: If yes, when and what did the pharmacy advise?)call the pcp for new Rx/ no refills   Preferred Pharmacy (with phone number or street name): [facility] ([place]), [place] - [address]  [address], [place] ([place]) [place] [address]  Phone:  [phone]  Fax:  [phone]  Has the patient been seen for an appointment in the last year OR does the patient have an upcoming appointment? Yes.    Agent: Please be advised that RX refills may take up to 3 business days. We ask that you follow-up with your pharmacy.

## 2021-08-09 ENCOUNTER — Encounter: Payer: Medicaid Other | Admitting: Surgery

## 2021-08-09 ENCOUNTER — Other Ambulatory Visit (HOSPITAL_COMMUNITY): Payer: Medicaid Other

## 2021-08-09 ENCOUNTER — Encounter (HOSPITAL_COMMUNITY): Payer: Medicaid Other

## 2021-08-11 ENCOUNTER — Telehealth: Payer: Self-pay | Admitting: *Deleted

## 2021-08-11 NOTE — Telephone Encounter (Signed)
Patient's mother called for appointment for the patient because they recently discovered they have been living in a home with black mold. Scheduled appointment for evaluation.

## 2021-08-15 MED ORDER — CALCIUM ACETATE (PHOS BINDER) 667 MG PO TABS: 667.0000 mg | ORAL_TABLET | Freq: Three times a day (TID) | ORAL | 4 refills | Status: AC

## 2021-08-15 MED ORDER — SODIUM BICARBONATE 650 MG PO TABS: 1300.0000 mg | ORAL_TABLET | Freq: Two times a day (BID) | ORAL | 3 refills | Status: DC

## 2021-08-16 ENCOUNTER — Encounter: Payer: Medicaid Other | Admitting: Surgery

## 2021-08-16 ENCOUNTER — Other Ambulatory Visit (HOSPITAL_COMMUNITY): Payer: Medicaid Other

## 2021-08-16 ENCOUNTER — Encounter (HOSPITAL_COMMUNITY): Payer: Medicaid Other

## 2021-08-16 ENCOUNTER — Ambulatory Visit: Payer: Self-pay | Admitting: *Deleted

## 2021-08-16 NOTE — Telephone Encounter (Signed)
Reached out to the patient today after speaking with his mother last week regarding the family living with black mold and now having breathing difficulties over the last several months. Patient stated he has problems falling asleep.Patient's SOB is mostly at night when he tries to go to sleep. He admits to using the mother's oxygen several nights/week when he lies down to sleep.  C/o SOB with exertion, unexplained bruises and a productive cough. Wheezes at times upon exhalation. Explained to the patient when a symptom like SOB is present, it is best to be evaluated at the ED so we can provide for his needs, ie Oxygen if needed and qualify for. Patient voiced understanding information provided. TC to office and messaged via teams for assistance with appointment .        Reason for Disposition  [1] MODERATE longstanding difficulty breathing (e.g., speaks in phrases, SOB even at rest, pulse 100-120) AND [2] SAME as normal  Answer Assessment - Initial Assessment Questions 1. RESPIRATORY STATUS: "Describe your breathing?" (e.g., wheezing, shortness of breath, unable to speak, severe coughing)     SOB mainly at night, coughing, wheeze at times 2. ONSET: "When did this breathing problem begin?"     More than 2 months 3. PATTERN "Does the difficult breathing come and go, or has it been constant since it started?"      Comes and goes 4. SEVERITY: "How bad is your breathing?" (e.g., mild, moderate, severe)    - MILD: No SOB at rest, mild SOB with walking, speaks normally in sentences, can lie down, no retractions, pulse < 100.    - MODERATE: SOB at rest, SOB with minimal exertion and prefers to sit, cannot lie down flat, speaks in phrases, mild retractions, audible wheezing, pulse 100-120.    - SEVERE: Very SOB at rest, speaks in single words, struggling to breathe, sitting hunched forward, retractions, pulse > 120      With exertion feels more SOB 5. RECURRENT SYMPTOM: "Have you had difficulty  breathing before?" If Yes, ask: "When was the last time?" and "What happened that time?"      Experiences SOB almost every da. 6. CARDIAC HISTORY: "Do you have any history of heart disease?" (e.g., heart attack, angina, bypass surgery, angioplasty)      Yes, CHF, HTN 7. LUNG HISTORY: "Do you have any history of lung disease?"  (e.g., pulmonary embolus, asthma, emphysema)     no 8. CAUSE: "What do you think is causing the breathing problem?"      Living with black mold 9. OTHER SYMPTOMS: "Do you have any other symptoms? (e.g., dizziness, runny nose, cough, chest pain, fever)     Runny nose, congestion, dizziness with standing too long and bending down. 10. O2 SATURATION MONITOR:  "Do you use an oxygen saturation monitor (pulse oximeter) at home?" If Yes, "What is your reading (oxygen level) today?" "What is your usual oxygen saturation reading?" (e.g., 95%)        11. PREGNANCY: "Is there any chance you are pregnant?" "When was your last menstrual period?"       na 12. TRAVEL: "Have you traveled out of the country in the last month?" (e.g., travel history, exposures)       no  Protocols used: Breathing Difficulty-A-AH

## 2021-08-17 NOTE — Telephone Encounter (Signed)
Called patient and left vm to call [phone] to schedule appt with Dr. Joya Gaskins

## 2021-08-19 ENCOUNTER — Telehealth: Payer: Self-pay | Admitting: Critical Care Medicine

## 2021-08-19 NOTE — Telephone Encounter (Signed)
Patients mother called  in to schedule earlier appt then December. Please call back

## 2021-08-20 NOTE — Telephone Encounter (Signed)
Called patient no answer. Left vm to call [phone] to get sooner appt.

## 2021-08-21 NOTE — Progress Notes (Signed)
Office Note     CC:  ESRD Requesting Provider:  Elsie Stain, MD  HPI: Stanley [name] is a Right handed 45 y.o. ([birth year]) male with kidney disease who presents at the request of Elsie Stain, MD for permanent HD access.  Disease appears to be a product of uncontrolled hypertension.  The patient has had no prior access procedures, no tunneled lines, no current access. Pt not on dialysis yet.   On exam, [name] was visibly emotional.  He was frustrated the progression of his kidney disease.  He asked about alternative therapies that could improve his renal function.   The pt is  on a statin for cholesterol management.  The pt is  on a daily aspirin.   Other AC:  - The pt is  on medications for hypertension.   The pt is not diabetic. Tobacco hx:  former  Past Medical History:  Diagnosis Date   Anxiety    Chronic kidney disease (CKD), stage V ([place]) 11/2020   CKD (chronic kidney disease), stage IV (H[facility])    Depression    Dyslexia    Heart palpitations    High cholesterol    Hypertension    Hypertensive urgency    Polysubstance (including opioids) dependence, binge pattern ([place]) 08/2020   Stroke ([place]) 06/03/2017   "minor; completely recovered today" (06/13/2017)   Tobacco abuse     Past Surgical History:  Procedure Laterality Date   NO PAST SURGERIES      Social History   Socioeconomic History   Marital status: Married    Spouse name: Not on file   Number of children: Not on file   Years of education: Not on file   Highest education level: Not on file  Occupational History   Not on file  Tobacco Use   Smoking status: Former    Packs/day: 1.00    Years: 30.00    Pack years: 30.00    Types: Cigarettes   Smokeless tobacco: Never  Vaping Use   Vaping Use: Never used  Substance and Sexual Activity   Alcohol use: No   Drug use: Yes    Types: Marijuana    Comment: 06/13/2017 "not often"   Sexual activity: Not Currently  Other Topics Concern   Not on file   Social History Narrative   Not on file   Social Determinants of Health   Financial Resource Strain: Not on file  Food Insecurity: Not on file  Transportation Needs: Not on file  Physical Activity: Not on file  Stress: Not on file  Social Connections: Not on file  Intimate Partner Violence: Not on file    Family History  Problem Relation Age of Onset   Hypertension Mother    Hypertension Brother    Diabetes type II Brother     Current Outpatient Medications  Medication Sig Dispense Refill   acetaminophen (TYLENOL) 500 MG tablet Take 1,000 mg by mouth as needed for mild pain.     albuterol (PROAIR HFA) 108 (90 Base) MCG/ACT inhaler Inhale 1-2 puffs into the lungs every 6 (six) hours as needed for wheezing or shortness of breath. (Patient taking differently: Inhale 1-2 puffs into the lungs as needed for wheezing or shortness of breath.) 18 g 11   amLODipine (NORVASC) 10 MG tablet Take 1 tablet (10 mg total) by mouth daily. 90 tablet 3   aspirin EC 81 MG tablet Take 1 tablet (81 mg total) by mouth daily. 100 tablet 1   atorvastatin (  LIPITOR) 40 MG tablet Take 1 tablet (40 mg total) by mouth daily. 30 tablet 11   calcium acetate (PHOSLO) 667 MG tablet Take 1 tablet (667 mg total) by mouth 3 (three) times daily with meals. 90 tablet 4   cloNIDine (CATAPRES) 0.1 MG tablet Take 1 tablet (0.1 mg total) by mouth 2 (two) times daily. 60 tablet 0   doxazosin (CARDURA) 8 MG tablet Take 2 tablets (16 mg total) by mouth daily. NEED OV. 161 tablet 0   folic acid (FOLVITE) 096 MCG tablet Take 0.5 tablets (400 mcg total) by mouth daily. 30 tablet 11   furosemide (LASIX) 80 MG tablet Take 80 mg by mouth 2 (two) times daily.     hydrALAZINE (APRESOLINE) 100 MG tablet Take 1 tablet (100 mg total) by mouth 3 (three) times daily. Please keep your 07/15/21 appt with Dr. Marlou Porch prior to further refills. 180 tablet 0   labetalol (NORMODYNE) 200 MG tablet Take 3 tablets (600 mg total) by mouth 3 (three)  times daily. Pt must keep upcoming appt in Oct for further refills 540 tablet 0   Multiple Vitamin (MULTIVITAMIN WITH MINERALS) TABS tablet Take 1 tablet by mouth daily.     ondansetron (ZOFRAN ODT) 4 MG disintegrating tablet Take 1 tablet (4 mg total) by mouth every 8 (eight) hours as needed for nausea or vomiting. 30 tablet 3   PSYLLIUM PO Take 1 tablet by mouth daily. (Patient not taking: Reported on 03/29/2021)     sodium bicarbonate 650 MG tablet Take 2 tablets (1,300 mg total) by mouth 2 (two) times daily. 120 tablet 3   traZODone (DESYREL) 100 MG tablet Take 1 tablet (100 mg total) by mouth at bedtime. 30 tablet 0   No current facility-administered medications for this visit.    Allergies  Allergen Reactions   Lactose Intolerance (Gi) Nausea And Vomiting   Nitroglycerin Anxiety and Other (See Comments)    Pain and feels like he is on fire   Other Swelling    All beans   Isosorbide Itching    headaches     REVIEW OF SYSTEMS:   [X]  denotes positive finding, [ ]  denotes negative finding Cardiac  Comments:  Chest pain or chest pressure:    Shortness of breath upon exertion:    Short of breath when lying flat:    Irregular heart rhythm:        Vascular    Pain in calf, thigh, or hip brought on by ambulation:    Pain in feet at night that wakes you up from your sleep:     Blood clot in your veins:    Leg swelling:         Pulmonary    Oxygen at home:    Productive cough:     Wheezing:         Neurologic    Sudden weakness in arms or legs:     Sudden numbness in arms or legs:     Sudden onset of difficulty speaking or slurred speech:    Temporary loss of vision in one eye:     Problems with dizziness:         Gastrointestinal    Blood in stool:     Vomited blood:         Genitourinary    Burning when urinating:     Blood in urine:        Psychiatric    Major depression:  Hematologic    Bleeding problems:    Problems with blood clotting too easily:         Skin    Rashes or ulcers:        Constitutional    Fever or chills:      PHYSICAL EXAMINATION:  There were no vitals filed for this visit.  General:  WDWN in NAD; vital signs documented above Gait: Not observed HENT: WNL, normocephalic Pulmonary: normal non-labored breathing , without Rales, rhonchi,  wheezing Cardiac: regular HR, Abdomen: soft, NT, no masses Skin: without rashes Vascular Exam/Pulses:  Right Left  Radial 2+ (normal) 2+ (normal)  Ulnar 2+ (normal) 2+ (normal)                   Extremities: without ischemic changes, without Gangrene , without cellulitis; without open wounds;  Musculoskeletal: no muscle wasting or atrophy  Neurologic: A&O X 3;  No focal weakness or paresthesias are detected Psychiatric:  The pt has Normal affect.    Non-Invasive Vascular Imaging:   none    ASSESSMENT/PLAN:  ISSAIC WELLIVER is a 45 y.o. male who presents with chronic kidney disease stage 5  Patient needs bilateral upper extremity vein mapping.   This is scheduled for tomorrow I had an extensive discussion with this patient in regards to the nature of access surgery, including risk, benefits, and alternatives.   The patient is aware that the risks of access surgery include but are not limited to: bleeding, infection, steal syndrome, nerve damage, ischemic monomelic neuropathy, failure of access to mature, complications related to venous hypertension, and possible need for additional access procedures in the future.  After talking with Mykal, I offered to discuss his vein map findings over the phone or in person.  He chose to discuss further in person.  I will see him on December 9 to continue our surgical discussion.  Broadus John, MD [facility] [phone]

## 2021-08-23 ENCOUNTER — Encounter (HOSPITAL_COMMUNITY): Payer: Medicaid Other

## 2021-08-23 ENCOUNTER — Other Ambulatory Visit (HOSPITAL_COMMUNITY): Payer: Medicaid Other

## 2021-08-23 ENCOUNTER — Other Ambulatory Visit: Payer: Self-pay | Admitting: Critical Care Medicine

## 2021-08-23 ENCOUNTER — Ambulatory Visit (INDEPENDENT_AMBULATORY_CARE_PROVIDER_SITE_OTHER): Payer: Medicaid Other | Admitting: Vascular Surgery

## 2021-08-23 ENCOUNTER — Encounter: Payer: Self-pay | Admitting: Vascular Surgery

## 2021-08-23 ENCOUNTER — Other Ambulatory Visit: Payer: Self-pay

## 2021-08-23 VITALS — BP 188/96 | HR 69 | Temp 98.3°F | Resp 16 | Ht 71.5 in | Wt 200.0 lb

## 2021-08-23 DIAGNOSIS — Z91048 Other nonmedicinal substance allergy status: Secondary | ICD-10-CM

## 2021-08-23 DIAGNOSIS — N185 Chronic kidney disease, stage 5: Secondary | ICD-10-CM

## 2021-08-24 ENCOUNTER — Ambulatory Visit (INDEPENDENT_AMBULATORY_CARE_PROVIDER_SITE_OTHER)
Admission: RE | Admit: 2021-08-24 | Discharge: 2021-08-24 | Disposition: A | Discharge status: Home / Self Care | Payer: Medicaid Other | Source: Ambulatory Visit | Attending: Vascular Surgery | Admitting: Vascular Surgery

## 2021-08-24 ENCOUNTER — Ambulatory Visit (HOSPITAL_COMMUNITY)
Admission: RE | Admit: 2021-08-24 | Discharge: 2021-08-24 | Disposition: A | Discharge status: Home / Self Care | Payer: Medicaid Other | Source: Ambulatory Visit | Attending: Vascular Surgery | Admitting: Vascular Surgery

## 2021-08-24 DIAGNOSIS — N184 Chronic kidney disease, stage 4 (severe): Secondary | ICD-10-CM | POA: Diagnosis not present

## 2021-08-24 DIAGNOSIS — I129 Hypertensive chronic kidney disease with stage 1 through stage 4 chronic kidney disease, or unspecified chronic kidney disease: Secondary | ICD-10-CM | POA: Insufficient documentation

## 2021-08-25 ENCOUNTER — Other Ambulatory Visit: Payer: Self-pay | Admitting: Cardiology

## 2021-08-25 DIAGNOSIS — I10 Essential (primary) hypertension: Secondary | ICD-10-CM

## 2021-08-31 ENCOUNTER — Other Ambulatory Visit: Payer: Self-pay

## 2021-08-31 ENCOUNTER — Ambulatory Visit: Payer: Medicaid Other | Attending: Family Medicine

## 2021-09-03 LAB — ALLERGEN PROFILE, MOLD
Alternaria Alternata IgE: <0.1 kU/L
Aspergillus Fumigatus IgE: <0.1 kU/L
Aureobasidi Pullulans IgE: <0.1 kU/L
Candida Albicans IgE: <0.1 kU/L
Cladosporium Herbarum IgE: <0.1 kU/L
M009-IgE Fusarium proliferatum: <0.1 kU/L
M014-IgE Epicoccum purpur: <0.1 kU/L
Mucor Racemosus IgE: <0.1 kU/L
Penicillium Chrysogen IgE: <0.1 kU/L
Phoma Betae IgE: <0.1 kU/L
Setomelanomma Rostrat: <0.1 kU/L
Stemphylium Herbarum IgE: 0.11 kU/L — AB

## 2021-09-03 LAB — IGE: IgE (Immunoglobulin E), Serum: 70 IU/mL (ref 6–495)

## 2021-09-10 ENCOUNTER — Encounter: Payer: Self-pay | Admitting: Vascular Surgery

## 2021-09-10 ENCOUNTER — Other Ambulatory Visit: Payer: Self-pay

## 2021-09-10 ENCOUNTER — Ambulatory Visit (INDEPENDENT_AMBULATORY_CARE_PROVIDER_SITE_OTHER): Payer: Medicaid Other | Admitting: Vascular Surgery

## 2021-09-10 VITALS — BP 191/94 | HR 71 | Temp 99.0°F | Resp 20 | Ht 71.5 in | Wt 216.0 lb

## 2021-09-10 DIAGNOSIS — N185 Chronic kidney disease, stage 5: Secondary | ICD-10-CM | POA: Diagnosis not present

## 2021-09-10 NOTE — Progress Notes (Signed)
Office Note    HPI: Stanley [name] is a Right handed 45 y.o. (1976-01-30) male with kidney disease who presents in follow-up for fistula creation discussion.  Disease appears to be a product of uncontrolled hypertension.  The patient has had no prior access procedures, no tunneled lines, no current access. Pt not on dialysis yet.   On exam, [name] was visibly emotional, improved from 2 weeks ago.  He understands the necessity of dialysis.  He denied fevers, chills, but has appreciated significant weight gain over the last several weeks.  The pt is  on a statin for cholesterol management.  The pt is  on a daily aspirin.   Other AC:  - The pt is  on medications for hypertension.   The pt is not diabetic. Tobacco hx:  former  Past Medical History:  Diagnosis Date   Anxiety    Chronic kidney disease (CKD), stage V ([place]) 11/2020   CKD (chronic kidney disease), stage IV (H[facility])    Depression    Dyslexia    Heart palpitations    High cholesterol    Hypertension    Hypertensive urgency    Polysubstance (including opioids) dependence, binge pattern ([place]) 08/2020   Stroke ([place]) 06/03/2017   "minor; completely recovered today" (06/13/2017)   Tobacco abuse     Past Surgical History:  Procedure Laterality Date   NO PAST SURGERIES      Social History   Socioeconomic History   Marital status: Married    Spouse name: Not on file   Number of children: Not on file   Years of education: Not on file   Highest education level: Not on file  Occupational History   Not on file  Tobacco Use   Smoking status: Former    Packs/day: 1.00    Years: 30.00    Pack years: 30.00    Types: Cigarettes   Smokeless tobacco: Never  Vaping Use   Vaping Use: Never used  Substance and Sexual Activity   Alcohol use: No   Drug use: Yes    Types: Marijuana    Comment: 06/13/2017 "not often"   Sexual activity: Not Currently  Other Topics Concern   Not on file  Social History Narrative   Not on file    Social Determinants of Health   Financial Resource Strain: Not on file  Food Insecurity: Not on file  Transportation Needs: Not on file  Physical Activity: Not on file  Stress: Not on file  Social Connections: Not on file  Intimate Partner Violence: Not on file    Family History  Problem Relation Age of Onset   Hypertension Mother    Hypertension Brother    Diabetes type II Brother     Current Outpatient Medications  Medication Sig Dispense Refill   acetaminophen (TYLENOL) 500 MG tablet Take 1,000 mg by mouth as needed for mild pain.     albuterol (PROAIR HFA) 108 (90 Base) MCG/ACT inhaler Inhale 1-2 puffs into the lungs every 6 (six) hours as needed for wheezing or shortness of breath. (Patient taking differently: Inhale 1-2 puffs into the lungs as needed for wheezing or shortness of breath.) 18 g 11   aspirin EC 81 MG tablet Take 1 tablet (81 mg total) by mouth daily. 100 tablet 1   atorvastatin (LIPITOR) 40 MG tablet Take 1 tablet (40 mg total) by mouth daily. 30 tablet 11   calcium acetate (PHOSLO) 667 MG tablet Take 1 tablet (667 mg total) by mouth  3 (three) times daily with meals. 90 tablet 4   cloNIDine (CATAPRES) 0.1 MG tablet Take 1 tablet (0.1 mg total) by mouth 2 (two) times daily. 60 tablet 0   doxazosin (CARDURA) 8 MG tablet Take 2 tablets (16 mg total) by mouth daily. NEED OV. 976 tablet 0   folic acid (FOLVITE) 734 MCG tablet Take 0.5 tablets (400 mcg total) by mouth daily. 30 tablet 11   furosemide (LASIX) 80 MG tablet Take 80 mg by mouth 2 (two) times daily.     hydrALAZINE (APRESOLINE) 100 MG tablet TAKE 1 TABLET BY MOUTH THREE TIMES DAILY 90 tablet 0   labetalol (NORMODYNE) 200 MG tablet TAKE 3 TABLETS BY MOUTH THREE TIMES DAILY 270 tablet 0   Multiple Vitamin (MULTIVITAMIN WITH MINERALS) TABS tablet Take 1 tablet by mouth daily.     ondansetron (ZOFRAN ODT) 4 MG disintegrating tablet Take 1 tablet (4 mg total) by mouth every 8 (eight) hours as needed for nausea  or vomiting. 30 tablet 3   PSYLLIUM PO Take 1 tablet by mouth daily.     sodium bicarbonate 650 MG tablet Take 2 tablets (1,300 mg total) by mouth 2 (two) times daily. 120 tablet 3   traZODone (DESYREL) 100 MG tablet Take 1 tablet (100 mg total) by mouth at bedtime. 30 tablet 0   amLODipine (NORVASC) 10 MG tablet Take 1 tablet (10 mg total) by mouth daily. 90 tablet 3   No current facility-administered medications for this visit.    Allergies  Allergen Reactions   Lactose Intolerance (Gi) Nausea And Vomiting   Nitroglycerin Anxiety and Other (See Comments)    Pain and feels like he is on fire   Other Swelling    All beans   Isosorbide Itching    headaches     REVIEW OF SYSTEMS:   [X]  denotes positive finding, [ ]  denotes negative finding Cardiac  Comments:  Chest pain or chest pressure:    Shortness of breath upon exertion:    Short of breath when lying flat:    Irregular heart rhythm:        Vascular    Pain in calf, thigh, or hip brought on by ambulation:    Pain in feet at night that wakes you up from your sleep:     Blood clot in your veins:    Leg swelling:         Pulmonary    Oxygen at home:    Productive cough:     Wheezing:         Neurologic    Sudden weakness in arms or legs:     Sudden numbness in arms or legs:     Sudden onset of difficulty speaking or slurred speech:    Temporary loss of vision in one eye:     Problems with dizziness:         Gastrointestinal    Blood in stool:     Vomited blood:         Genitourinary    Burning when urinating:     Blood in urine:        Psychiatric    Major depression:         Hematologic    Bleeding problems:    Problems with blood clotting too easily:        Skin    Rashes or ulcers:        Constitutional    Fever or chills:  PHYSICAL EXAMINATION:  Vitals:   09/10/21 1546  BP: (!) 191/94  Pulse: 71  Resp: 20  Temp: 99 F (37.2 C)  SpO2: 98%  Weight: 216 lb (98 kg)  Height: 5' 11.5"  (1.816 m)    General:  WDWN in NAD; vital signs documented above Gait: Not observed HENT: WNL, normocephalic Pulmonary: normal non-labored breathing , without Rales, rhonchi,  wheezing Cardiac: regular HR, Abdomen: soft, NT, no masses Skin: without rashes Vascular Exam/Pulses:  Right Left  Radial 2+ (normal) 2+ (normal)  Ulnar 2+ (normal) 2+ (normal)                   Extremities: without ischemic changes, without Gangrene , without cellulitis; without open wounds;  Musculoskeletal: no muscle wasting or atrophy  Neurologic: A&O X 3;  No focal weakness or paresthesias are detected Psychiatric:  The pt has Normal affect.    Non-Invasive Vascular Imaging:   none    ASSESSMENT/PLAN:  POPE BRUNTY is a 45 y.o. male who presents with chronic kidney disease stage 5  Patient underwent bilateral upper extremity vein mapping demonstrating adequate conduit at the right basilic vein only The patient is right-handed therefore I discussed left-sided AV graft versus right-sided brachiobasilic fistula, possible graft. I had an extensive discussion with this patient in regards to the nature of access surgery, including risk, benefits, and alternatives.   The patient is aware that the risks of access surgery include but are not limited to: bleeding, infection, steal syndrome, nerve damage, ischemic monomelic neuropathy, failure of access to mature, complications related to venous hypertension, and possible need for additional access procedures in the future.  Patient elected to proceed with right sided, first stage brachiobasilic fistula, understanding that this could require second stage.  Should the conduit look poor Intra-Op, Mr. Zurcher is aware I will moved to AV graft on the right side.  Broadus John, MD [facility] [phone]

## 2021-09-15 ENCOUNTER — Ambulatory Visit: Payer: Medicaid Other | Admitting: Critical Care Medicine

## 2021-09-15 NOTE — Progress Notes (Deleted)
Established Patient Office Visit  Subjective:  Patient ID: Stanley [name], male    DOB: [birth year]  Age: 45 y.o. MRN: [id number]  CC: No chief complaint on file.   HPI Therin S Teffeteller presents for ***  Past Medical History:  Diagnosis Date   Anxiety    Chronic kidney disease (CKD), stage V ([place]) 11/2020   CKD (chronic kidney disease), stage IV ([facility])    Depression    Dyslexia    Heart palpitations    High cholesterol    Hypertension    Hypertensive urgency    Polysubstance (including opioids) dependence, binge pattern ([place]) 08/2020   Stroke ([place]) 06/03/2017   "minor; completely recovered today" (06/13/2017)   Tobacco abuse     Past Surgical History:  Procedure Laterality Date   NO PAST SURGERIES      Family History  Problem Relation Age of Onset   Hypertension Mother    Hypertension Brother    Diabetes type II Brother     Social History   Socioeconomic History   Marital status: Married    Spouse name: Not on file   Number of children: Not on file   Years of education: Not on file   Highest education level: Not on file  Occupational History   Not on file  Tobacco Use   Smoking status: Former    Packs/day: 1.00    Years: 30.00    Pack years: 30.00    Types: Cigarettes   Smokeless tobacco: Never  Vaping Use   Vaping Use: Never used  Substance and Sexual Activity   Alcohol use: No   Drug use: Yes    Types: Marijuana    Comment: 06/13/2017 "not often"   Sexual activity: Not Currently  Other Topics Concern   Not on file  Social History Narrative   Not on file   Social Determinants of Health   Financial Resource Strain: Not on file  Food Insecurity: Not on file  Transportation Needs: Not on file  Physical Activity: Not on file  Stress: Not on file  Social Connections: Not on file  Intimate Partner Violence: Not on file    Outpatient Medications Prior to Visit  Medication Sig Dispense Refill   acetaminophen (TYLENOL) 500 MG tablet Take 1,000 mg by  mouth as needed for mild pain.     albuterol (PROAIR HFA) 108 (90 Base) MCG/ACT inhaler Inhale 1-2 puffs into the lungs every 6 (six) hours as needed for wheezing or shortness of breath. (Patient taking differently: Inhale 1-2 puffs into the lungs as needed for wheezing or shortness of breath.) 18 g 11   amLODipine (NORVASC) 10 MG tablet Take 1 tablet (10 mg total) by mouth daily. 90 tablet 3   aspirin EC 81 MG tablet Take 1 tablet (81 mg total) by mouth daily. 100 tablet 1   atorvastatin (LIPITOR) 40 MG tablet Take 1 tablet (40 mg total) by mouth daily. 30 tablet 11   calcium acetate (PHOSLO) 667 MG tablet Take 1 tablet (667 mg total) by mouth 3 (three) times daily with meals. 90 tablet 4   cloNIDine (CATAPRES) 0.1 MG tablet Take 1 tablet (0.1 mg total) by mouth 2 (two) times daily. 60 tablet 0   doxazosin (CARDURA) 8 MG tablet Take 2 tablets (16 mg total) by mouth daily. NEED OV. 144 tablet 0   folic acid (FOLVITE) 315 MCG tablet Take 0.5 tablets (400 mcg total) by mouth daily. 30 tablet 11   furosemide (LASIX) 80  MG tablet Take 80 mg by mouth 2 (two) times daily.     hydrALAZINE (APRESOLINE) 100 MG tablet TAKE 1 TABLET BY MOUTH THREE TIMES DAILY 90 tablet 0   labetalol (NORMODYNE) 200 MG tablet TAKE 3 TABLETS BY MOUTH THREE TIMES DAILY 270 tablet 0   Multiple Vitamin (MULTIVITAMIN WITH MINERALS) TABS tablet Take 1 tablet by mouth daily.     ondansetron (ZOFRAN ODT) 4 MG disintegrating tablet Take 1 tablet (4 mg total) by mouth every 8 (eight) hours as needed for nausea or vomiting. 30 tablet 3   PSYLLIUM PO Take 1 tablet by mouth daily.     sodium bicarbonate 650 MG tablet Take 2 tablets (1,300 mg total) by mouth 2 (two) times daily. 120 tablet 3   traZODone (DESYREL) 100 MG tablet Take 1 tablet (100 mg total) by mouth at bedtime. 30 tablet 0   No facility-administered medications prior to visit.    Allergies  Allergen Reactions   Lactose Intolerance (Gi) Nausea And Vomiting    Nitroglycerin Anxiety and Other (See Comments)    Pain and feels like he is on fire   Other Swelling    All beans   Isosorbide Itching    headaches    ROS Review of Systems    Objective:    Physical Exam  There were no vitals taken for this visit. Wt Readings from Last 3 Encounters:  09/10/21 216 lb (98 kg)  08/23/21 200 lb (90.7 kg)  03/29/21 193 lb 9.6 oz (87.8 kg)     Health Maintenance Due  Topic Date Due   COVID-19 Vaccine (1) Never done   TETANUS/TDAP  Never done   Pneumococcal Vaccine 73-40 Years old (2 - PCV) 06/15/2018   INFLUENZA VACCINE  05/03/2021    There are no preventive care reminders to display for this patient.  Lab Results  Component Value Date   TSH 1.310 03/01/2018   Lab Results  Component Value Date   WBC 4.0 03/29/2021   HGB 10.1 (L) 03/29/2021   HCT 30.6 (L) 03/29/2021   MCV 90 03/29/2021   PLT 195 03/29/2021   Lab Results  Component Value Date   NA 141 03/29/2021   K 5.0 03/29/2021   CO2 16 (L) 03/29/2021   GLUCOSE 84 03/29/2021   BUN 71 (H) 03/29/2021   CREATININE 9.02 (H) 03/29/2021   BILITOT 0.4 03/29/2021   ALKPHOS 103 03/29/2021   AST 7 03/29/2021   ALT 5 03/29/2021   PROT 6.8 03/29/2021   ALBUMIN 4.6 03/29/2021   CALCIUM 8.7 03/29/2021   ANIONGAP 13 11/13/2020   EGFR 7 (L) 03/29/2021   Lab Results  Component Value Date   CHOL 76 (L) 03/29/2021   Lab Results  Component Value Date   HDL 40 03/29/2021   Lab Results  Component Value Date   LDLCALC 25 03/29/2021   Lab Results  Component Value Date   TRIG 37 03/29/2021   Lab Results  Component Value Date   CHOLHDL 1.9 03/29/2021   Lab Results  Component Value Date   HGBA1C 5.0 07/10/2020   HGBA1C 5.0 07/10/2020   HGBA1C 5.0 (A) 07/10/2020   HGBA1C 5.0 07/10/2020      Assessment & Plan:   Problem List Items Addressed This Visit   None   No orders of the defined types were placed in this encounter.   Follow-up: No follow-ups on file.     Asencion Noble, MD

## 2021-09-16 ENCOUNTER — Ambulatory Visit: Payer: Self-pay

## 2021-09-16 ENCOUNTER — Telehealth: Payer: Self-pay

## 2021-09-16 NOTE — Telephone Encounter (Signed)
°  Chief Complaint: leg swelling Symptoms: LLE swelling up to knee, discolored area on chin, tightness, difficulty breathing when lying down Frequency: 1 week Pertinent Negatives: Patient denies fever Disposition: [x] ED /[] Urgent Care (no appt availability in office) / [] Appointment(In office/virtual)/ []  [facility]/ [] Home Care/ [] Refused Recommended Disposition  Additional Notes: Pt was advised to present to ED based on sx discussed. Pt states he will be going to [facility].     Reason for Disposition  Difficulty breathing at rest  Answer Assessment - Initial Assessment Questions 1. ONSET: "When did the swelling start?" (e.g., minutes, hours, days)     Week 2. LOCATION: "What part of the leg is swollen?"  "Are both legs swollen or just one leg?"     L leg swollen up to knee worse, R leg swelling  3. SEVERITY: "How bad is the swelling?" (e.g., localized; mild, moderate, severe)  - Localized - small area of swelling localized to one leg  - MILD pedal edema - swelling limited to foot and ankle, pitting edema < 1/4 inch (6 mm) deep, rest and elevation eliminate most or all swelling  - MODERATE edema - swelling of lower leg to knee, pitting edema > 1/4 inch (6 mm) deep, rest and elevation only partially reduce swelling  - SEVERE edema - swelling extends above knee, facial or hand swelling present      Moderate to severe 4. REDNESS: "Does the swelling look red or infected?"     No 5. PAIN: "Is the swelling painful to touch?" If Yes, ask: "How painful is it?"   (Scale 1-10; mild, moderate or severe)     7 6. FEVER: "Do you have a fever?" If Yes, ask: "What is it, how was it measured, and when did it start?"      No 7. CAUSE: "What do you think is causing the leg swelling?"     unsure 8. MEDICAL HISTORY: "Do you have a history of heart failure, kidney disease, liver failure, or cancer?"     Heart and kidney failure 9. RECURRENT SYMPTOM: "Have you had leg swelling before?" If Yes, ask:  "When was the last time?" "What happened that time?"     No 10. OTHER SYMPTOMS: "Do you have any other symptoms?" (e.g., chest pain, difficulty breathing)       Difficulty breathing when laying down, discolored area on chin, tightness  Protocols used: Leg Swelling and Edema-A-AH

## 2021-09-16 NOTE — Telephone Encounter (Signed)
Attempted to reach patient on multiple attempts to schedule right arm fistula. Left messages with no response back. Will mail patient a letter to contact office.

## 2021-09-22 NOTE — Progress Notes (Signed)
Established Patient Office Visit  Subjective:  Patient ID: Stanley [name], male    DOB: 1975/12/28  Age: 45 y.o. MRN: [id number]  CC:  Chief Complaint  Patient presents with   Leg Swelling   mold exposure     HPI note this visit the patient is accompanied by his brother who helps the patient organize his clinic appointments Stanley [name] presents for primary care follow-up and I am not been able to see him since June of this year.  Patient has end-stage kidney disease and needs dialysis.  Nephrology states he is also missed several appointments.  He did achieve an appointment previously this follow-up with vascular surgery to have a fistula placed in the right arm and had vein mapping done.  Unfortunately he has not received turns any of their phone calls to get the procedure scheduled.  In the interim his weight continues to go up he continues have difficulty with shortness of breath testicular edema leg swelling cough incontinence of urine wheezing.  He was exposed to significant mold in his home.  He is on a diuretic at this time.  He does have some muscle spasticity as well.  It appears he has been missing his appointments with nephrology and also does not yet have an established time to have the fistula placed in the right arm.  Also the degree of his edema is such that he may need more acute dialysis with a catheter being placed at the same time.  Patient stated he previously refused to have this done but for fear of infection.  I am not sure he fully understand that this is taken out after it is no longer needed and that can be placed sterilely   Past Medical History:  Diagnosis Date   Anxiety    Chronic kidney disease (CKD), stage V ([place]) 11/2020   CKD (chronic kidney disease), stage IV ([facility])    Depression    Dyslexia    Heart palpitations    High cholesterol    Hypertension    Hypertensive urgency    Polysubstance (including opioids) dependence, binge pattern ([place]) 08/2020    Stroke ([place]) 06/03/2017   "minor; completely recovered today" (06/13/2017)   Tobacco abuse     Past Surgical History:  Procedure Laterality Date   NO PAST SURGERIES      Family History  Problem Relation Age of Onset   Hypertension Mother    Hypertension Brother    Diabetes type II Brother     Social History   Socioeconomic History   Marital status: Married    Spouse name: Not on file   Number of children: Not on file   Years of education: Not on file   Highest education level: Not on file  Occupational History   Not on file  Tobacco Use   Smoking status: Former    Packs/day: 1.00    Years: 30.00    Pack years: 30.00    Types: Cigarettes   Smokeless tobacco: Never  Vaping Use   Vaping Use: Never used  Substance and Sexual Activity   Alcohol use: No   Drug use: Yes    Types: Marijuana    Comment: 06/13/2017 "not often"   Sexual activity: Not Currently  Other Topics Concern   Not on file  Social History Narrative   Not on file   Social Determinants of Health   Financial Resource Strain: Not on file  Food Insecurity: Not on file  Transportation Needs:  Not on file  Physical Activity: Not on file  Stress: Not on file  Social Connections: Not on file  Intimate Partner Violence: Not on file    Outpatient Medications Prior to Visit  Medication Sig Dispense Refill   acetaminophen (TYLENOL) 500 MG tablet Take 1,000 mg by mouth as needed for mild pain.     albuterol (PROAIR HFA) 108 (90 Base) MCG/ACT inhaler Inhale 1-2 puffs into the lungs every 6 (six) hours as needed for wheezing or shortness of breath. (Patient taking differently: Inhale 1-2 puffs into the lungs as needed for wheezing or shortness of breath.) 18 g 11   amLODipine (NORVASC) 10 MG tablet Take 1 tablet (10 mg total) by mouth daily. 90 tablet 3   aspirin EC 81 MG tablet Take 1 tablet (81 mg total) by mouth daily. 100 tablet 1   atorvastatin (LIPITOR) 40 MG tablet Take 1 tablet (40 mg total) by mouth  daily. 30 tablet 11   calcium acetate (PHOSLO) 667 MG tablet Take 1 tablet (667 mg total) by mouth 3 (three) times daily with meals. 90 tablet 4   cloNIDine (CATAPRES) 0.1 MG tablet Take 1 tablet (0.1 mg total) by mouth 2 (two) times daily. 60 tablet 0   doxazosin (CARDURA) 8 MG tablet Take 2 tablets (16 mg total) by mouth daily. NEED OV. 462 tablet 0   folic acid (FOLVITE) 863 MCG tablet Take 0.5 tablets (400 mcg total) by mouth daily. 30 tablet 11   hydrALAZINE (APRESOLINE) 100 MG tablet TAKE 1 TABLET BY MOUTH THREE TIMES DAILY 90 tablet 0   labetalol (NORMODYNE) 200 MG tablet TAKE 3 TABLETS BY MOUTH THREE TIMES DAILY 270 tablet 0   Multiple Vitamin (MULTIVITAMIN WITH MINERALS) TABS tablet Take 1 tablet by mouth daily.     ondansetron (ZOFRAN ODT) 4 MG disintegrating tablet Take 1 tablet (4 mg total) by mouth every 8 (eight) hours as needed for nausea or vomiting. 30 tablet 3   PSYLLIUM PO Take 1 tablet by mouth daily.     sodium bicarbonate 650 MG tablet Take 2 tablets (1,300 mg total) by mouth 2 (two) times daily. 120 tablet 3   traZODone (DESYREL) 100 MG tablet Take 1 tablet (100 mg total) by mouth at bedtime. 30 tablet 0   furosemide (LASIX) 80 MG tablet Take 80 mg by mouth 2 (two) times daily.     No facility-administered medications prior to visit.    Allergies  Allergen Reactions   Lactose Intolerance (Gi) Nausea And Vomiting   Nitroglycerin Anxiety and Other (See Comments)    Pain and feels like he is on fire   Other Swelling    All beans   Isosorbide Itching    headaches    ROS Review of Systems  Constitutional:  Positive for fatigue and unexpected weight change. Negative for chills, diaphoresis and fever.  HENT:  Negative for congestion, hearing loss, nosebleeds, sore throat and tinnitus.   Eyes:  Negative for photophobia and redness.  Respiratory:  Positive for cough, shortness of breath and wheezing. Negative for stridor.   Cardiovascular:  Positive for leg swelling.  Negative for chest pain and palpitations.  Gastrointestinal:  Positive for nausea. Negative for abdominal pain, blood in stool, constipation, diarrhea and vomiting.  Endocrine: Positive for polyuria. Negative for polydipsia.  Genitourinary:  Positive for scrotal swelling. Negative for dysuria, flank pain, frequency, hematuria and urgency.  Musculoskeletal:  Positive for gait problem. Negative for back pain, myalgias and neck pain.  Skin:  Negative for rash.  Allergic/Immunologic: Negative for environmental allergies.  Neurological:  Negative for dizziness, tremors, seizures, weakness and headaches.  Hematological:  Does not bruise/bleed easily.  Psychiatric/Behavioral:  Positive for dysphoric mood and sleep disturbance. Negative for suicidal ideas. The patient is not nervous/anxious.      Objective:    Physical Exam Vitals and nursing note reviewed.  Constitutional:      Appearance: Normal appearance. He is well-developed. He is obese. He is ill-appearing. He is not diaphoretic.  HENT:     Head: Normocephalic and atraumatic.     Nose: Nose normal. No nasal deformity, septal deviation, mucosal edema or rhinorrhea.     Right Sinus: No maxillary sinus tenderness or frontal sinus tenderness.     Left Sinus: No maxillary sinus tenderness or frontal sinus tenderness.     Mouth/Throat:     Mouth: Mucous membranes are moist.     Pharynx: Oropharynx is clear. No oropharyngeal exudate.  Eyes:     General: No scleral icterus.    Conjunctiva/sclera: Conjunctivae normal.     Pupils: Pupils are equal, round, and reactive to light.  Neck:     Thyroid: No thyromegaly.     Vascular: No carotid bruit or JVD.     Trachea: Trachea normal. No tracheal tenderness or tracheal deviation.  Cardiovascular:     Rate and Rhythm: Normal rate and regular rhythm.     Chest Wall: PMI is not displaced.     Pulses: Normal pulses. No decreased pulses.     Heart sounds: Normal heart sounds, S1 normal and S2  normal. Heart sounds not distant. No murmur heard. No systolic murmur is present.  No diastolic murmur is present.    No friction rub. No gallop. No S3 or S4 sounds.  Pulmonary:     Effort: Pulmonary effort is normal. No tachypnea, accessory muscle usage or respiratory distress.     Breath sounds: No stridor. Rales present. No decreased breath sounds, wheezing or rhonchi.  Chest:     Chest wall: No tenderness.  Abdominal:     General: Bowel sounds are normal. There is no distension.     Palpations: Abdomen is soft. Abdomen is not rigid.     Tenderness: There is no abdominal tenderness. There is no guarding or rebound.  Musculoskeletal:        General: Swelling present. Normal range of motion.     Cervical back: Normal range of motion and neck supple. No edema, erythema or rigidity. No muscular tenderness. Normal range of motion.     Right lower leg: Edema present.     Left lower leg: Edema present.  Lymphadenopathy:     Head:     Right side of head: No submental or submandibular adenopathy.     Left side of head: No submental or submandibular adenopathy.     Cervical: No cervical adenopathy.  Skin:    General: Skin is warm and dry.     Coloration: Skin is not pale.     Findings: No rash.     Nails: There is no clubbing.  Neurological:     General: No focal deficit present.     Mental Status: He is alert and oriented to person, place, and time. Mental status is at baseline.     Sensory: No sensory deficit.  Psychiatric:        Attention and Perception: Attention and perception normal.        Mood and Affect: Mood is anxious.  Speech: Speech is delayed.        Behavior: Behavior is slowed and withdrawn. Behavior is cooperative.        Thought Content: Thought content normal. Thought content does not include homicidal or suicidal plan.        Cognition and Memory: Cognition is impaired. He exhibits impaired recent memory.        Judgment: Judgment normal.    BP (!) 160/83     Pulse 68    Resp 16    Wt 221 lb 6.4 oz (100.4 kg)    SpO2 95%    BMI 30.45 kg/m  Wt Readings from Last 3 Encounters:  09/23/21 221 lb 6.4 oz (100.4 kg)  09/10/21 216 lb (98 kg)  08/23/21 200 lb (90.7 kg)     Health Maintenance Due  Topic Date Due   COVID-19 Vaccine (1) Never done   TETANUS/TDAP  Never done   Pneumococcal Vaccine 52-39 Years old (2 - PCV) 06/15/2018   INFLUENZA VACCINE  05/03/2021    There are no preventive care reminders to display for this patient.  Lab Results  Component Value Date   TSH 1.310 03/01/2018   Lab Results  Component Value Date   WBC 4.0 03/29/2021   HGB 10.1 (L) 03/29/2021   HCT 30.6 (L) 03/29/2021   MCV 90 03/29/2021   PLT 195 03/29/2021   Lab Results  Component Value Date   NA 141 03/29/2021   K 5.0 03/29/2021   CO2 16 (L) 03/29/2021   GLUCOSE 84 03/29/2021   BUN 71 (H) 03/29/2021   CREATININE 9.02 (H) 03/29/2021   BILITOT 0.4 03/29/2021   ALKPHOS 103 03/29/2021   AST 7 03/29/2021   ALT 5 03/29/2021   PROT 6.8 03/29/2021   ALBUMIN 4.6 03/29/2021   CALCIUM 8.7 03/29/2021   ANIONGAP 13 11/13/2020   EGFR 7 (L) 03/29/2021   Lab Results  Component Value Date   CHOL 76 (L) 03/29/2021   Lab Results  Component Value Date   HDL 40 03/29/2021   Lab Results  Component Value Date   LDLCALC 25 03/29/2021   Lab Results  Component Value Date   TRIG 37 03/29/2021   Lab Results  Component Value Date   CHOLHDL 1.9 03/29/2021   Lab Results  Component Value Date   HGBA1C 5.0 07/10/2020   HGBA1C 5.0 07/10/2020   HGBA1C 5.0 (A) 07/10/2020   HGBA1C 5.0 07/10/2020      Assessment & Plan:   Problem List Items Addressed This Visit       Cardiovascular and Mediastinum   Uncontrolled hypertension    Patient continues to be hypertensive but not dangerously so I did not change in his medications at this visit      Relevant Medications   furosemide (LASIX) 80 MG tablet     Genitourinary   CKD (chronic kidney disease)  stage 5, GFR less than 15 ml/min ([facility]CC)    Patient essentially at end-stage kidney disease needing dialysis  I called nephrology and spoke to the patient's primary nephrologist Dr. Azzie Roup, who stated he is comfortable with vascular surgery placing a temporary dialysis access as well as a fistula I then called vein and vascular surgery and found that the patient's primary vascular surgeon was not available however the nurse that he could schedule with Dr. Donzetta Matters in the morning for vascular access therefore this was scheduled for the patient to arrive at [facility] 9:30 AM for vascular access and have a  temporary vascular catheter placed as well.  The nephrologist said he would follow-up with vascular surgery to determine timing for outpatient dialysis I told the patient is a possibility he may have to be admitted for inpatient dialysis      Hydrocele    This is on the basis of the patient's end-stage renal disease with fluid retention       Meds ordered this encounter  Medications   furosemide (LASIX) 80 MG tablet    Sig: Take 2 tablets (160 mg total) by mouth 2 (two) times daily.    Dispense:  120 tablet    Refill:  1  38 minutes spent coordinating care calling the vascular surgery office speaking to primary nephrologist and communicate with the patient's brother and patient regarding his appointments high-level complexity decision making and high likelihood of death if he does not get dialysis high likelihood of admission to the hospital  Follow-up: Return in about 2 months (around 11/24/2021).    Asencion Noble, MD

## 2021-09-23 ENCOUNTER — Ambulatory Visit: Payer: Medicaid Other | Attending: Critical Care Medicine | Admitting: Critical Care Medicine

## 2021-09-23 ENCOUNTER — Telehealth: Payer: Self-pay

## 2021-09-23 ENCOUNTER — Encounter: Payer: Self-pay | Admitting: Critical Care Medicine

## 2021-09-23 ENCOUNTER — Other Ambulatory Visit: Payer: Self-pay

## 2021-09-23 DIAGNOSIS — I10 Essential (primary) hypertension: Secondary | ICD-10-CM

## 2021-09-23 DIAGNOSIS — N433 Hydrocele, unspecified: Secondary | ICD-10-CM

## 2021-09-23 DIAGNOSIS — N185 Chronic kidney disease, stage 5: Secondary | ICD-10-CM

## 2021-09-23 MED ORDER — FUROSEMIDE 80 MG PO TABS: 160.0000 mg | ORAL_TABLET | Freq: Two times a day (BID) | ORAL | 1 refills | Status: DC

## 2021-09-23 NOTE — Assessment & Plan Note (Signed)
Patient essentially at end-stage kidney disease needing dialysis  I called nephrology and spoke to the patient's primary nephrologist Dr. Azzie Roup, who stated he is comfortable with vascular surgery placing a temporary dialysis access as well as a fistula I then called vein and vascular surgery and found that the patient's primary vascular surgeon was not available however the nurse that he could schedule with Dr. Donzetta Matters in the morning for vascular access therefore this was scheduled for the patient to arrive at [facility] 9:30 AM for vascular access and have a temporary vascular catheter placed as well.  The nephrologist said he would follow-up with vascular surgery to determine timing for outpatient dialysis I told the patient is a possibility he may have to be admitted for inpatient dialysis

## 2021-09-23 NOTE — Assessment & Plan Note (Signed)
This is on the basis of the patient's end-stage renal disease with fluid retention

## 2021-09-23 NOTE — Assessment & Plan Note (Signed)
Patient continues to be hypertensive but not dangerously so I did not change in his medications at this visit

## 2021-09-23 NOTE — Patient Instructions (Addendum)
You have been scheduled for a procedure tomorrow to have your fistula put in your arm and a catheter placed  You must arrive at [facility] 9:30 AM and nothing by mouth after midnight tonight  Do not take your medications in the morning  A letter will be given to you from the vascular surgery office will give this to you before you leave we will have this with you along with this document  Your kidney doctor and the vascular surgeon are aware and will collaborate you will start dialysis nearly immediately after you get the catheter placed  It is possible you may to be admitted to the hospital after the procedure simply because they may have to start the procedure of dialysis on a more urgent basis  Return to Dr. Joya Gaskins 2 months

## 2021-09-23 NOTE — Telephone Encounter (Signed)
Spoke with patient and brother. Advised per recommendation of Dr. Virl Cagey who spoke with Dr. [name], patient should proceed to [facility] ER for treatment and evaluation and will have dialysis access placed during admission. Patient verbalized understanding and agreed to proceed to the ER.

## 2021-09-24 ENCOUNTER — Other Ambulatory Visit: Payer: Self-pay

## 2021-09-24 ENCOUNTER — Encounter (HOSPITAL_COMMUNITY): Admission: RE | Payer: Self-pay | Source: Home / Self Care

## 2021-09-24 ENCOUNTER — Telehealth: Payer: Self-pay | Admitting: Critical Care Medicine

## 2021-09-24 ENCOUNTER — Emergency Department (HOSPITAL_COMMUNITY): Payer: Medicaid Other

## 2021-09-24 ENCOUNTER — Encounter (HOSPITAL_COMMUNITY): Payer: Self-pay | Admitting: Emergency Medicine

## 2021-09-24 ENCOUNTER — Inpatient Hospital Stay (HOSPITAL_COMMUNITY)
Admission: EM | Admit: 2021-09-24 | Discharge: 2021-09-30 | DRG: 264 | Disposition: A | Discharge status: Home / Self Care | Payer: Medicaid Other | Source: Ambulatory Visit | Attending: Internal Medicine | Admitting: Internal Medicine

## 2021-09-24 ENCOUNTER — Ambulatory Visit (HOSPITAL_COMMUNITY): Admission: RE | Admit: 2021-09-24 | Payer: Medicaid Other | Source: Home / Self Care | Admitting: Vascular Surgery

## 2021-09-24 DIAGNOSIS — E739 Lactose intolerance, unspecified: Secondary | ICD-10-CM | POA: Diagnosis present

## 2021-09-24 DIAGNOSIS — E669 Obesity, unspecified: Secondary | ICD-10-CM | POA: Diagnosis present

## 2021-09-24 DIAGNOSIS — Z992 Dependence on renal dialysis: Secondary | ICD-10-CM | POA: Diagnosis not present

## 2021-09-24 DIAGNOSIS — F419 Anxiety disorder, unspecified: Secondary | ICD-10-CM | POA: Diagnosis present

## 2021-09-24 DIAGNOSIS — I10 Essential (primary) hypertension: Secondary | ICD-10-CM

## 2021-09-24 DIAGNOSIS — J9601 Acute respiratory failure with hypoxia: Secondary | ICD-10-CM | POA: Diagnosis not present

## 2021-09-24 DIAGNOSIS — D631 Anemia in chronic kidney disease: Secondary | ICD-10-CM | POA: Diagnosis present

## 2021-09-24 DIAGNOSIS — J810 Acute pulmonary edema: Secondary | ICD-10-CM | POA: Diagnosis present

## 2021-09-24 DIAGNOSIS — D649 Anemia, unspecified: Secondary | ICD-10-CM

## 2021-09-24 DIAGNOSIS — G4733 Obstructive sleep apnea (adult) (pediatric): Secondary | ICD-10-CM | POA: Diagnosis present

## 2021-09-24 DIAGNOSIS — Z8249 Family history of ischemic heart disease and other diseases of the circulatory system: Secondary | ICD-10-CM

## 2021-09-24 DIAGNOSIS — Z79899 Other long term (current) drug therapy: Secondary | ICD-10-CM

## 2021-09-24 DIAGNOSIS — Z7982 Long term (current) use of aspirin: Secondary | ICD-10-CM

## 2021-09-24 DIAGNOSIS — N2581 Secondary hyperparathyroidism of renal origin: Secondary | ICD-10-CM | POA: Diagnosis present

## 2021-09-24 DIAGNOSIS — Z6834 Body mass index (BMI) 34.0-34.9, adult: Secondary | ICD-10-CM

## 2021-09-24 DIAGNOSIS — N186 End stage renal disease: Secondary | ICD-10-CM | POA: Diagnosis not present

## 2021-09-24 DIAGNOSIS — E7800 Pure hypercholesterolemia, unspecified: Secondary | ICD-10-CM | POA: Diagnosis present

## 2021-09-24 DIAGNOSIS — Z789 Other specified health status: Secondary | ICD-10-CM

## 2021-09-24 DIAGNOSIS — I132 Hypertensive heart and chronic kidney disease with heart failure and with stage 5 chronic kidney disease, or end stage renal disease: Principal | ICD-10-CM | POA: Diagnosis present

## 2021-09-24 DIAGNOSIS — I161 Hypertensive emergency: Secondary | ICD-10-CM | POA: Diagnosis present

## 2021-09-24 DIAGNOSIS — Z888 Allergy status to other drugs, medicaments and biological substances status: Secondary | ICD-10-CM

## 2021-09-24 DIAGNOSIS — E872 Acidosis, unspecified: Secondary | ICD-10-CM | POA: Diagnosis present

## 2021-09-24 DIAGNOSIS — R0602 Shortness of breath: Secondary | ICD-10-CM

## 2021-09-24 DIAGNOSIS — I5032 Chronic diastolic (congestive) heart failure: Secondary | ICD-10-CM | POA: Diagnosis present

## 2021-09-24 DIAGNOSIS — E8770 Fluid overload, unspecified: Secondary | ICD-10-CM | POA: Diagnosis present

## 2021-09-24 DIAGNOSIS — Z20822 Contact with and (suspected) exposure to covid-19: Secondary | ICD-10-CM | POA: Diagnosis present

## 2021-09-24 DIAGNOSIS — E875 Hyperkalemia: Secondary | ICD-10-CM | POA: Diagnosis present

## 2021-09-24 DIAGNOSIS — R9431 Abnormal electrocardiogram [ECG] [EKG]: Secondary | ICD-10-CM | POA: Diagnosis present

## 2021-09-24 DIAGNOSIS — N189 Chronic kidney disease, unspecified: Secondary | ICD-10-CM

## 2021-09-24 DIAGNOSIS — Z8673 Personal history of transient ischemic attack (TIA), and cerebral infarction without residual deficits: Secondary | ICD-10-CM

## 2021-09-24 DIAGNOSIS — Z833 Family history of diabetes mellitus: Secondary | ICD-10-CM

## 2021-09-24 DIAGNOSIS — Z87891 Personal history of nicotine dependence: Secondary | ICD-10-CM

## 2021-09-24 HISTORY — DX: Dyspnea, unspecified: R06.00

## 2021-09-24 LAB — BASIC METABOLIC PANEL
Anion gap: 15 (ref 5–15)
BUN: 94 mg/dL — ABNORMAL HIGH (ref 6–20)
CO2: 13 mmol/L — ABNORMAL LOW (ref 22–32)
Calcium: 7.2 mg/dL — ABNORMAL LOW (ref 8.9–10.3)
Chloride: 113 mmol/L — ABNORMAL HIGH (ref 98–111)
Creatinine, Ser: 10.93 mg/dL — ABNORMAL HIGH (ref 0.61–1.24)
GFR, Estimated: 5 mL/min — ABNORMAL LOW (ref >60)
Glucose, Bld: 83 mg/dL (ref 70–99)
Potassium: 5.4 mmol/L — ABNORMAL HIGH (ref 3.5–5.1)
Sodium: 141 mmol/L (ref 135–145)

## 2021-09-24 LAB — IRON AND TIBC
Iron: 56 ug/dL (ref 45–182)
Saturation Ratios: 20 % (ref 17.9–39.5)
TIBC: 284 ug/dL (ref 250–450)
UIBC: 228 ug/dL

## 2021-09-24 LAB — TROPONIN I (HIGH SENSITIVITY)
Troponin I (High Sensitivity): 34 ng/L — ABNORMAL HIGH (ref <18)
Troponin I (High Sensitivity): 35 ng/L — ABNORMAL HIGH (ref <18)

## 2021-09-24 LAB — CBC
HCT: 20.6 % — ABNORMAL LOW (ref 39.0–52.0)
Hemoglobin: 6.3 g/dL — CL (ref 13.0–17.0)
MCH: 29.6 pg (ref 26.0–34.0)
MCHC: 30.6 g/dL (ref 30.0–36.0)
MCV: 96.7 fL (ref 80.0–100.0)
Platelets: 215 10*3/uL (ref 150–400)
RBC: 2.13 MIL/uL — ABNORMAL LOW (ref 4.22–5.81)
RDW: 16.6 % — ABNORMAL HIGH (ref 11.5–15.5)
WBC: 4.9 10*3/uL (ref 4.0–10.5)
nRBC: 0 % (ref 0.0–0.2)

## 2021-09-24 LAB — PREPARE RBC (CROSSMATCH)

## 2021-09-24 LAB — RESP PANEL BY RT-PCR (FLU A&B, COVID) ARPGX2
Influenza A by PCR: NEGATIVE
Influenza B by PCR: NEGATIVE
SARS Coronavirus 2 by RT PCR: NEGATIVE

## 2021-09-24 LAB — FERRITIN: Ferritin: 109 ng/mL (ref 24–336)

## 2021-09-24 LAB — BRAIN NATRIURETIC PEPTIDE: B Natriuretic Peptide: 2528.3 pg/mL — ABNORMAL HIGH (ref 0.0–100.0)

## 2021-09-24 SURGERY — ARTERIOVENOUS (AV) FISTULA CREATION
Anesthesia: Choice | Laterality: Right

## 2021-09-24 MED ORDER — PENTAFLUOROPROP-TETRAFLUOROETH EX AERO
1.0000 "application " | INHALATION_SPRAY | CUTANEOUS | Status: DC | PRN
  Filled 2021-09-24: qty 116

## 2021-09-24 MED ORDER — ALTEPLASE 2 MG IJ SOLR
2.0000 mg | Freq: Once | INTRAMUSCULAR | Status: DC | PRN
  Filled 2021-09-24: qty 2

## 2021-09-24 MED ORDER — SODIUM ZIRCONIUM CYCLOSILICATE 10 G PO PACK
10.0000 g | PACK | Freq: Once | ORAL | Status: AC
Start: 2021-09-24 — End: 2021-09-24
  Administered 2021-09-24: 18:00:00 10 g via ORAL
  Filled 2021-09-24: qty 1

## 2021-09-24 MED ORDER — HEPARIN SODIUM (PORCINE) 1000 UNIT/ML DIALYSIS
1000.0000 [IU] | INTRAMUSCULAR | Status: DC | PRN
  Administered 2021-09-27: 1000 [IU] via INTRAVENOUS_CENTRAL
  Administered 2021-09-28: 3200 [IU] via INTRAVENOUS_CENTRAL
  Filled 2021-09-24 (×4): qty 1

## 2021-09-24 MED ORDER — SODIUM CHLORIDE 0.9 % IV SOLN: 100.0000 mL | INTRAVENOUS | Status: DC | PRN

## 2021-09-24 MED ORDER — LABETALOL HCL 5 MG/ML IV SOLN
20.0000 mg | Freq: Once | INTRAVENOUS | Status: AC
  Administered 2021-09-24: 21:00:00 20 mg via INTRAVENOUS
  Filled 2021-09-24: qty 4

## 2021-09-24 MED ORDER — CALCIUM ACETATE (PHOS BINDER) 667 MG PO CAPS
667.0000 mg | ORAL_CAPSULE | Freq: Three times a day (TID) | ORAL | Status: DC
  Administered 2021-09-25 – 2021-09-30 (×12): 667 mg via ORAL
  Filled 2021-09-24 (×13): qty 1

## 2021-09-24 MED ORDER — SODIUM CHLORIDE 0.9 % IV SOLN: 10.0000 mL/h | Freq: Once | INTRAVENOUS | Status: DC

## 2021-09-24 MED ORDER — LIDOCAINE HCL (PF) 1 % IJ SOLN: 5.0000 mL | INTRAMUSCULAR | Status: DC | PRN

## 2021-09-24 MED ORDER — SODIUM BICARBONATE 650 MG PO TABS
1300.0000 mg | ORAL_TABLET | Freq: Three times a day (TID) | ORAL | Status: DC
  Administered 2021-09-24 – 2021-09-27 (×8): 1300 mg via ORAL
  Filled 2021-09-24 (×9): qty 2

## 2021-09-24 MED ORDER — DILTIAZEM HCL-DEXTROSE 125-5 MG/125ML-% IV SOLN (PREMIX)
5.0000 mg/h | INTRAVENOUS | Status: DC
  Administered 2021-09-25: 01:00:00 5 mg/h via INTRAVENOUS
  Filled 2021-09-24: qty 125

## 2021-09-24 MED ORDER — LIDOCAINE-PRILOCAINE 2.5-2.5 % EX CREA
1.0000 "application " | TOPICAL_CREAM | CUTANEOUS | Status: DC | PRN
  Filled 2021-09-24: qty 5

## 2021-09-24 MED ORDER — FUROSEMIDE 10 MG/ML IJ SOLN
120.0000 mg | Freq: Two times a day (BID) | INTRAVENOUS | Status: DC
  Administered 2021-09-24 – 2021-09-25 (×2): 120 mg via INTRAVENOUS
  Filled 2021-09-24: qty 12
  Filled 2021-09-24: qty 10
  Filled 2021-09-24: qty 12
  Filled 2021-09-24: qty 10
  Filled 2021-09-24: qty 12

## 2021-09-24 MED ORDER — CLONIDINE HCL 0.2 MG PO TABS
0.2000 mg | ORAL_TABLET | Freq: Three times a day (TID) | ORAL | Status: DC
  Administered 2021-09-24 – 2021-09-30 (×16): 0.2 mg via ORAL
  Filled 2021-09-24 (×16): qty 1

## 2021-09-24 MED ORDER — HYDRALAZINE HCL 20 MG/ML IJ SOLN
20.0000 mg | Freq: Once | INTRAMUSCULAR | Status: AC
  Administered 2021-09-24: 18:00:00 20 mg via INTRAVENOUS
  Filled 2021-09-24: qty 1

## 2021-09-24 MED ORDER — HYDRALAZINE HCL 25 MG PO TABS
50.0000 mg | ORAL_TABLET | Freq: Once | ORAL | Status: AC
  Administered 2021-09-24: 17:00:00 50 mg via ORAL
  Filled 2021-09-24: qty 2

## 2021-09-24 MED ORDER — CHLORHEXIDINE GLUCONATE CLOTH 2 % EX PADS
6.0000 | MEDICATED_PAD | Freq: Every day | CUTANEOUS | Status: DC
  Administered 2021-09-25 – 2021-09-30 (×6): 6 via TOPICAL

## 2021-09-24 NOTE — Consult Note (Addendum)
[facility] [place]  ASSESSMENT / PLAN: 45 y.o. male with CKDV nearing ESRD. He is volume overloaded. Recommend discussion with internal medicine and nephrology. He needs dialysis access. Unfortunately, he ate just prior to my evaluation, and so I cannot place a tunneled line in the OR today. Will plan for left upper extremity arteriovenous graft and tunneled dialysis catheter tomorrow AM at 0730. NPO after midnight.   CHIEF COMPLAINT: difficulty breathing  HISTORY OF PRESENT ILLNESS: Stanley [name] is a 45 y.o. male well-known to our service, following with Dr. Unk Lightning, for chronic kidney disease stage V.  The patient has had deterioration of his renal function over the past several days to the point where his nephrologist, Dr. [name], recommended he present to the ER yesterday.  The patient presents to the ER today with worsening shortness of breath, weakness, chest discomfort.  He reports he is not making much urine, "just tinkles." He is right hand dominant. He has never had a pacemaker or central venous catheter.   Past Medical History:  Diagnosis Date   Anxiety    Chronic kidney disease (CKD), stage V ([place]) 11/2020   CKD (chronic kidney disease), stage IV ([facility])    Depression    Dyslexia    Heart palpitations    High cholesterol    Hypertension    Hypertensive urgency    Polysubstance (including opioids) dependence, binge pattern ([place]) 08/2020   Stroke ([place]) 06/03/2017   "minor; completely recovered today" (06/13/2017)   Tobacco abuse     Past Surgical History:  Procedure Laterality Date   NO PAST SURGERIES      Family History  Problem Relation Age of Onset   Hypertension Mother    Hypertension Brother    Diabetes type II Brother     Social History   Socioeconomic History   Marital status: Married    Spouse name: Not on file   Number of children: Not on file   Years of education: Not on file   Highest education level: Not on file   Occupational History   Not on file  Tobacco Use   Smoking status: Former    Packs/day: 1.00    Years: 30.00    Pack years: 30.00    Types: Cigarettes   Smokeless tobacco: Never  Vaping Use   Vaping Use: Never used  Substance and Sexual Activity   Alcohol use: No   Drug use: Yes    Types: Marijuana    Comment: 06/13/2017 "not often"   Sexual activity: Not Currently  Other Topics Concern   Not on file  Social History Narrative   Not on file   Social Determinants of Health   Financial Resource Strain: Not on file  Food Insecurity: Not on file  Transportation Needs: Not on file  Physical Activity: Not on file  Stress: Not on file  Social Connections: Not on file  Intimate Partner Violence: Not on file    Allergies  Allergen Reactions   Lactose Intolerance (Gi) Nausea And Vomiting   Nitroglycerin Anxiety and Other (See Comments)    Pain and feels like he is on fire   Other Swelling    All beans   Isosorbide Itching    headaches    Current Facility-Administered Medications  Medication Dose Route Frequency Provider Last Rate Last Admin   hydrALAZINE (APRESOLINE) tablet 50 mg  50 mg Oral Once [place], Greggory Brandy, MD       Current Outpatient Medications  Medication  Sig Dispense Refill   acetaminophen (TYLENOL) 500 MG tablet Take 1,000 mg by mouth as needed for mild pain.     albuterol (PROAIR HFA) 108 (90 Base) MCG/ACT inhaler Inhale 1-2 puffs into the lungs every 6 (six) hours as needed for wheezing or shortness of breath. (Patient taking differently: Inhale 1-2 puffs into the lungs as needed for wheezing or shortness of breath.) 18 g 11   amLODipine (NORVASC) 10 MG tablet Take 1 tablet (10 mg total) by mouth daily. 90 tablet 3   aspirin EC 81 MG tablet Take 1 tablet (81 mg total) by mouth daily. 100 tablet 1   atorvastatin (LIPITOR) 40 MG tablet Take 1 tablet (40 mg total) by mouth daily. 30 tablet 11   calcium acetate (PHOSLO) 667 MG tablet Take 1 tablet (667 mg total) by  mouth 3 (three) times daily with meals. 90 tablet 4   cloNIDine (CATAPRES) 0.1 MG tablet Take 1 tablet (0.1 mg total) by mouth 2 (two) times daily. 60 tablet 0   doxazosin (CARDURA) 8 MG tablet Take 2 tablets (16 mg total) by mouth daily. NEED OV. 426 tablet 0   folic acid (FOLVITE) 834 MCG tablet Take 0.5 tablets (400 mcg total) by mouth daily. 30 tablet 11   furosemide (LASIX) 80 MG tablet Take 2 tablets (160 mg total) by mouth 2 (two) times daily. 120 tablet 1   hydrALAZINE (APRESOLINE) 100 MG tablet TAKE 1 TABLET BY MOUTH THREE TIMES DAILY 90 tablet 0   labetalol (NORMODYNE) 200 MG tablet TAKE 3 TABLETS BY MOUTH THREE TIMES DAILY 270 tablet 0   Melatonin 10 MG CAPS Take 1 tablet by mouth daily.     Multiple Vitamin (MULTIVITAMIN WITH MINERALS) TABS tablet Take 1 tablet by mouth daily.     sodium bicarbonate 650 MG tablet Take 2 tablets (1,300 mg total) by mouth 2 (two) times daily. 120 tablet 3   traZODone (DESYREL) 100 MG tablet Take 1 tablet (100 mg total) by mouth at bedtime. 30 tablet 0   ondansetron (ZOFRAN ODT) 4 MG disintegrating tablet Take 1 tablet (4 mg total) by mouth every 8 (eight) hours as needed for nausea or vomiting. 30 tablet 3    REVIEW OF SYSTEMS:  [X]  denotes positive finding, [ ]  denotes negative finding Cardiac  Comments:  Chest pain or chest pressure:    Shortness of breath upon exertion:    Short of breath when lying flat:    Irregular heart rhythm:        Vascular    Pain in calf, thigh, or hip brought on by ambulation:    Pain in feet at night that wakes you up from your sleep:     Blood clot in your veins:    Leg swelling:  x       Pulmonary    Oxygen at home:    Productive cough:     Wheezing:  x       Neurologic    Sudden weakness in arms or legs:     Sudden numbness in arms or legs:     Sudden onset of difficulty speaking or slurred speech:    Temporary loss of vision in one eye:     Problems with dizziness:         Gastrointestinal    Blood  in stool:     Vomited blood:         Genitourinary    Burning when urinating:     Blood in  urine:        Psychiatric    Major depression:         Hematologic    Bleeding problems:    Problems with blood clotting too easily:        Skin    Rashes or ulcers:        Constitutional    Fever or chills:      PHYSICAL EXAM Vitals:   09/24/21 1158 09/24/21 1340 09/24/21 1513 09/24/21 1600  BP: (!) 228/112 (!) 232/118  (!) 236/109  Pulse: 64 64  64  Resp: 18 16  (!) 23  Temp: 98.7 F (37.1 C) 98.5 F (36.9 C)    TempSrc: Oral Oral    SpO2: 95% 98%  97%  Weight:   86.2 kg   Height:   5\' 6"  (1.676 m)     Constitutional: Chronically ill appearing. Mild distress. Appears marginally nourished.  Neurologic: CN intact. no focal findings. no sensory loss. Psychiatric:  Mood and affect symmetric and appropriate. Eyes: Mild scleral icterus. No conjunctival pallor. Ears, nose, throat:  mucous membranes moist. Midline trachea.  Cardiac: regular rate and rhythm.  Respiratory: sitting upright. Audible wheezes from bedside.  Abdominal:  soft, non-tender, non-distended.  Peripheral vascular: 2+ brachial pulses bilaterally. Extremity: 2+ pitting BLE edema. no cyanosis. no pallor.  Skin: no gangrene. no ulceration.  Lymphatic: no Stemmer's sign. no palpable lymphadenopathy.  PERTINENT LABORATORY AND RADIOLOGIC DATA  Most recent CBC CBC Latest Ref Rng & Units 09/24/2021 03/29/2021 11/13/2020  WBC 4.0 - 10.5 K/uL 4.9 4.0 6.3  Hemoglobin 13.0 - 17.0 g/dL 6.3(LL) 10.1(L) 8.1(L)  Hematocrit 39.0 - 52.0 % 20.6(L) 30.6(L) 26.7(L)  Platelets 150 - 400 K/uL 215 195 311     Most recent CMP CMP Latest Ref Rng & Units 09/24/2021 03/29/2021 11/13/2020  Glucose 70 - 99 mg/dL 83 84 81  BUN 6 - 20 mg/dL 94(H) 71(H) 54(H)  Creatinine 0.61 - 1.24 mg/dL 10.93(H) 9.02(H) 6.88(H)  Sodium 135 - 145 mmol/L 141 141 135  Potassium 3.5 - 5.1 mmol/L 5.4(H) 5.0 4.4  Chloride 98 - 111 mmol/L 113(H) 105 103   CO2 22 - 32 mmol/L 13(L) 16(L) 19(L)  Calcium 8.9 - 10.3 mg/dL 7.2(L) 8.7 9.0  Total Protein 6.0 - 8.5 g/dL - 6.8 -  Total Bilirubin 0.0 - 1.2 mg/dL - 0.4 -  Alkaline Phos 44 - 121 IU/L - 103 -  AST 0 - 40 IU/L - 7 -  ALT 0 - 44 IU/L - 5 -    Renal function Estimated Creatinine Clearance: 8.9 mL/min (A) (by C-G formula based on SCr of 10.93 mg/dL (H)).  Hemoglobin A1C (%)  Date Value  07/10/2020 5.0   HbA1c, POC (prediabetic range) (%)  Date Value  07/10/2020 5.0 (A)   HbA1c, POC (controlled diabetic range) (%)  Date Value  07/10/2020 5.0   HbA1c POC (<> result, manual entry) (%)  Date Value  07/10/2020 5.0    LDL Chol Calc (NIH)  Date Value Ref Range Status  03/29/2021 25 0 - 99 mg/dL Final   LDL Direct  Date Value Ref Range Status  03/17/2020 42 0 - 99 mg/dL Final    Yevonne Aline. Stanford Breed, MD [facility] Phone Number: [phone] 09/24/2021 4:27 PM  Total time spent on preparing this encounter including chart review, data review, collecting history, examining the patient, coordinating care for this established patient, 40 minutes.  Portions of this report may have  been transcribed using voice recognition software.  Every effort has been made to ensure accuracy; however, inadvertent computerized transcription errors may still be present.

## 2021-09-24 NOTE — ED Notes (Signed)
Admitting MD at bedside. Informed MD that pt was feeling sob with labored breathing. Applied 2L O2. MD will order some Lasix for pt.

## 2021-09-24 NOTE — ED Provider Notes (Addendum)
[facility] EMERGENCY DEPARTMENT Provider Note   CSN: [id number] Arrival date & time: 09/24/21  1118     History Chief Complaint  Patient presents with   Shortness of Breath   Leg Swelling    Stanley [name] is a 45 y.o. male.  Patient with history of chronic kidney insufficiency presents to ER chief complaint of fatigue shortness of breath intermittent chest pain.  Symptoms ongoing for the past month and progressive in the last 2 to 3 days.  Stanley [name] is followed closely by his pulmonologist and nephrologist.  They have been trying to arrange new dialysis for the patient but Stanley [name] is been having difficulty making appointments.  Stanley [name] was scheduled for outpatient evaluation with vascular surgery but could not make recent appointments and most recently stated that a tree fell down on a part of his home and Stanley [name] could not make it to the hospital yesterday so Stanley [name] presents to the ER today.  No reports of fevers or cough or vomiting or diarrhea.      Past Medical History:  Diagnosis Date   Anxiety    Chronic kidney disease (CKD), stage V ([place]) 11/2020   CKD (chronic kidney disease), stage IV ([facility])    Depression    Dyslexia    Heart palpitations    High cholesterol    Hypertension    Hypertensive urgency    Polysubstance (including opioids) dependence, binge pattern ([place]) 08/2020   Stroke ([place]) 06/03/2017   "minor; completely recovered today" (06/13/2017)   Tobacco abuse     Patient Active Problem List   Diagnosis Date Noted   RAS (renal artery stenosis) ([place]) 03/29/2021   Observed sleep apnea 12/11/2019   Swollen testicle 12/19/2018   Hydrocele    CKD (chronic kidney disease) stage 5, GFR less than 15 ml/min ([place]) 03/01/2017   Hyperparathyroidism, secondary renal ([place]) 03/01/2017   Tobacco abuse 09/26/2016   Uncontrolled hypertension 09/26/2016    Past Surgical History:  Procedure Laterality Date   NO PAST SURGERIES         Family History  Problem Relation Age of  Onset   Hypertension Mother    Hypertension Brother    Diabetes type II Brother     Social History   Tobacco Use   Smoking status: Former    Packs/day: 1.00    Years: 30.00    Pack years: 30.00    Types: Cigarettes   Smokeless tobacco: Never  Vaping Use   Vaping Use: Never used  Substance Use Topics   Alcohol use: No   Drug use: Yes    Types: Marijuana    Comment: 06/13/2017 "not often"    Home Medications Prior to Admission medications   Medication Sig Start Date End Date Taking? Authorizing Provider  acetaminophen (TYLENOL) 500 MG tablet Take 1,000 mg by mouth as needed for mild pain.   Yes [provider]  albuterol (PROAIR HFA) 108 (90 Base) MCG/ACT inhaler Inhale 1-2 puffs into the lungs every 6 (six) hours as needed for wheezing or shortness of breath. Patient taking differently: Inhale 1-2 puffs into the lungs as needed for wheezing or shortness of breath. 03/18/20  Yes [name], FNP  amLODipine (NORVASC) 10 MG tablet Take 1 tablet (10 mg total) by mouth daily. 03/17/20 09/24/21 Yes Jerline Pain, MD  aspirin EC 81 MG tablet Take 1 tablet (81 mg total) by mouth daily. 03/29/21  Yes Elsie Stain, MD  atorvastatin (LIPITOR) 40 MG tablet Take  1 tablet (40 mg total) by mouth daily. 03/29/21  Yes Elsie Stain, MD  calcium acetate (PHOSLO) 667 MG tablet Take 1 tablet (667 mg total) by mouth 3 (three) times daily with meals. 08/15/21  Yes Elsie Stain, MD  cloNIDine (CATAPRES) 0.1 MG tablet Take 1 tablet (0.1 mg total) by mouth 2 (two) times daily. 03/29/21  Yes Elsie Stain, MD  doxazosin (CARDURA) 8 MG tablet Take 2 tablets (16 mg total) by mouth daily. NEED OV. 04/08/21  Yes Jerline Pain, MD  folic acid (FOLVITE) 086 MCG tablet Take 0.5 tablets (400 mcg total) by mouth daily. 03/10/20  Yes [name], FNP  furosemide (LASIX) 80 MG tablet Take 2 tablets (160 mg total) by mouth 2 (two) times daily. 09/23/21  Yes Elsie Stain, MD   hydrALAZINE (APRESOLINE) 100 MG tablet TAKE 1 TABLET BY MOUTH THREE TIMES DAILY 08/25/21  Yes Jerline Pain, MD  labetalol (NORMODYNE) 200 MG tablet TAKE 3 TABLETS BY MOUTH THREE TIMES DAILY 08/25/21  Yes Jerline Pain, MD  Melatonin 10 MG CAPS Take 1 tablet by mouth daily.   Yes [provider]  Multiple Vitamin (MULTIVITAMIN WITH MINERALS) TABS tablet Take 1 tablet by mouth daily.   Yes [provider]  sodium bicarbonate 650 MG tablet Take 2 tablets (1,300 mg total) by mouth 2 (two) times daily. 08/15/21 10/14/21 Yes Elsie Stain, MD  traZODone (DESYREL) 100 MG tablet Take 1 tablet (100 mg total) by mouth at bedtime. 08/02/21  Yes Elsie Stain, MD  ondansetron (ZOFRAN ODT) 4 MG disintegrating tablet Take 1 tablet (4 mg total) by mouth every 8 (eight) hours as needed for nausea or vomiting. 03/29/21   Elsie Stain, MD    Allergies    Lactose intolerance (gi), Nitroglycerin, Other, and Isosorbide  Review of Systems   Review of Systems  Constitutional:  Negative for fever.  HENT:  Negative for ear pain and sore throat.   Eyes:  Negative for pain.  Respiratory:  Negative for cough.   Cardiovascular:  Positive for chest pain.  Gastrointestinal:  Negative for abdominal pain.  Genitourinary:  Negative for flank pain.  Musculoskeletal:  Negative for back pain.  Skin:  Negative for color change and rash.  Neurological:  Negative for syncope.  All other systems reviewed and are negative.  Physical Exam Updated Vital Signs BP (!) 176/137    Pulse 70    Temp 98.4 F (36.9 C)    Resp 18    Ht 5\' 6"  (1.676 m)    Wt 86.2 kg    SpO2 99%    BMI 30.67 kg/m   Physical Exam Constitutional:      Appearance: Stanley [name] is well-developed.  HENT:     Head: Normocephalic.     Nose: Nose normal.  Eyes:     Extraocular Movements: Extraocular movements intact.  Cardiovascular:     Rate and Rhythm: Normal rate.  Pulmonary:     Effort: Pulmonary effort is normal.      Breath sounds: Wheezing present.  Skin:    Coloration: Skin is not jaundiced.  Neurological:     Mental Status: Stanley [name] is alert. Mental status is at baseline.    ED Results / Procedures / Treatments   Labs (all labs ordered are listed, but only abnormal results are displayed) Labs Reviewed  BASIC METABOLIC PANEL - Abnormal; Notable for the following components:      Result Value   Potassium  5.4 (*)    Chloride 113 (*)    CO2 13 (*)    BUN 94 (*)    Creatinine, Ser 10.93 (*)    Calcium 7.2 (*)    GFR, Estimated 5 (*)    All other components within normal limits  CBC - Abnormal; Notable for the following components:   RBC 2.13 (*)    Hemoglobin 6.3 (*)    HCT 20.6 (*)    RDW 16.6 (*)    All other components within normal limits  BRAIN NATRIURETIC PEPTIDE - Abnormal; Notable for the following components:   B Natriuretic Peptide 2,528.3 (*)    All other components within normal limits  TROPONIN I (HIGH SENSITIVITY) - Abnormal; Notable for the following components:   Troponin I (High Sensitivity) 34 (*)    All other components within normal limits  TROPONIN I (HIGH SENSITIVITY) - Abnormal; Notable for the following components:   Troponin I (High Sensitivity) 35 (*)    All other components within normal limits  RESP PANEL BY RT-PCR (FLU A&B, COVID) ARPGX2  IRON AND TIBC  FERRITIN  RENAL FUNCTION PANEL  CBC  HEPATITIS B SURFACE ANTIGEN  HEPATITIS B SURFACE ANTIBODY,QUALITATIVE  HEPATITIS B SURFACE ANTIBODY, QUANTITATIVE  CBG MONITORING, ED  TYPE AND SCREEN  PREPARE RBC (CROSSMATCH)    EKG EKG Interpretation  Date/Time:  Friday September 24 2021 12:27:23 EST Ventricular Rate:  65 PR Interval:  166 QRS Duration: 88 QT Interval:  504 QTC Calculation: 524 R Axis:   55 Text Interpretation: Normal sinus rhythm Nonspecific T wave abnormality Prolonged QT Abnormal ECG Confirmed by Thamas Jaegers ([id number]) on 09/24/2021 3:17:09 PM  Radiology DG Chest 2 View  Result Date:  09/24/2021 CLINICAL DATA:  Chest pain. EXAM: CHEST - 2 VIEW COMPARISON:  11/09/2020 FINDINGS: Two-view exam shows subtle alveolar opacity in the mid and lower lungs bilaterally without substantial pleural effusion. The cardio pericardial silhouette is enlarged. The visualized bony structures of the thorax show no acute abnormality. IMPRESSION: Subtle alveolar opacity in the mid and lower lungs bilaterally suggesting edema. Electronically Signed   By: Misty Stanley M.D.   On: 09/24/2021 12:58    Procedures .Critical Care Performed by: Luna Fuse, MD Authorized by: Luna Fuse, MD   Critical care provider statement:    Critical care time (minutes):  40   Critical care time was exclusive of:  Separately billable procedures and treating other patients and teaching time   Critical care was necessary to treat or prevent imminent or life-threatening deterioration of the following conditions:  Circulatory failure   Medications Ordered in ED Medications  Chlorhexidine Gluconate Cloth 2 % PADS 6 each (has no administration in time range)  pentafluoroprop-tetrafluoroeth (GEBAUERS) aerosol 1 application (has no administration in time range)  lidocaine (PF) (XYLOCAINE) 1 % injection 5 mL (has no administration in time range)  lidocaine-prilocaine (EMLA) cream 1 application (has no administration in time range)  0.9 %  sodium chloride infusion (has no administration in time range)  0.9 %  sodium chloride infusion (has no administration in time range)  heparin injection 1,000 Units (has no administration in time range)  alteplase (CATHFLO ACTIVASE) injection 2 mg (has no administration in time range)  sodium bicarbonate tablet 1,300 mg (1,300 mg Oral Given 09/24/21 1815)  cloNIDine (CATAPRES) tablet 0.2 mg (0.2 mg Oral Given 09/24/21 1815)  furosemide (LASIX) 120 mg in dextrose 5 % 50 mL IVPB (has no administration in time range)  calcium acetate (PHOSLO)  capsule 667 mg (has no administration in  time range)  0.9 %  sodium chloride infusion (has no administration in time range)  diltiazem (CARDIZEM) 125 mg in dextrose 5% 125 mL (1 mg/mL) infusion (has no administration in time range)  hydrALAZINE (APRESOLINE) tablet 50 mg (50 mg Oral Given 09/24/21 1639)  sodium zirconium cyclosilicate (LOKELMA) packet 10 g (10 g Oral Given 09/24/21 1815)  hydrALAZINE (APRESOLINE) injection 20 mg (20 mg Intravenous Given 09/24/21 1817)  labetalol (NORMODYNE) injection 20 mg (20 mg Intravenous Given 09/24/21 2057)    ED Course  I have reviewed the triage vital signs and the nursing notes.  Pertinent labs & imaging results that were available during my care of the patient were reviewed by me and considered in my medical decision making (see chart for details).    MDM Rules/Calculators/A&P                         Labs show anemia, severe kidney failure, elevated potassium.  Patient is significantly hypertensive, O2 saturation 90% on room air and normal.  Case discussed with vascular surgery Dr. Stanford Breed who came and saw the patient and will place a catheter and fistula tomorrow.  Case discussed with nephrologist Dr.Singh, who will arrange for dialysis tomorrow once catheter has been placed.  Hospitalist consulted for admission.  Addendum: Hospitalist on call back saying the blood pressure was too high for the floor recommends ICU.  Multiple blood pressure medications were given here in the ER including several doses of hydralazine, clonidine without any change in blood pressure.  Patient ultimately appears to respond to IV labetalol which was initially not given due to heart rate in the 60s. ICU consulted, recommends diltiazem drip and admission to medicine.  Medicine has been reconsulted for admission.  Final Clinical Impression(s) / ED Diagnoses Final diagnoses:  Shortness of breath  Chronic kidney disease, unspecified CKD stage  Anemia, unspecified type    Rx / DC Orders ED Discharge  Orders     None        Luna Fuse, MD 09/24/21 1712    Luna Fuse, MD 09/24/21 2151

## 2021-09-24 NOTE — Telephone Encounter (Signed)
Copied from [place] [id number]. Topic: General - Other >> Sep 24, 2021  9:36 AM Tessa Lerner A wrote: Reason for CRM: The patient's brother Bedelia Person would like to be contacted by a member of clinical staff when possible  Mr. Kortz has additional concerns with coordinating the patients care as well questions about patient's upcoming procedure for catheter insertion   Please contact further when possible

## 2021-09-24 NOTE — Telephone Encounter (Signed)
Copied from [place] [phone]. Topic: General - Other >> Sep 23, 2021  1:03 PM McGill, Nelva Bush wrote: Reason for CRM: pt brother on the line stated pt was supposed to have a procedure done tomorrow however, procedure was canceled and was told to go to ER. ... pt brother is requesting to speak with a nurse .. I was unable to get anyone on the line from practice .   Requesting to speak with Carly?   Please advice.

## 2021-09-24 NOTE — Telephone Encounter (Signed)
Called pt's brother and left vm

## 2021-09-24 NOTE — Telephone Encounter (Signed)
The brother of Stanley [name] came yesterday and spoke with me about this and Dr.wright was made aware of changes.

## 2021-09-24 NOTE — Consult Note (Signed)
Nephrology Consult   Assessment/Recommendations:   CKD 5, now ESRD-has been needing to start HD for quite some time, patient has been resistant to this. Follows with Dr. Geralyn Flash. -AVG placement and TDC placement with [facility] tomorrow. Will initiate HD tomorrow, slow start protocol (tentative plan: HD 12/24, 12/26, 12/27). Will need CLIP for outpatient placement -Maintain MAP>65 for optimal renal perfusion.  -Agree with holding ACE-I, avoid further nephrotoxins including NSAIDS, Morphine.  Unless absolutely necessary, avoid CT with contrast and/or MRI with gadolinium.     SOB, pulm edema -lasix 120mg  BID until stable on HD  Hypertension -markedly hypertensive, chronic malignant HTN, resume home meds, will UF as tolerated. Will also be adding lasix for now  Hyperkalemia -starting HD as above -lokelma x 1 dose today, should also improve with lasix  Anemia due to chronic kidney disease: -Transfuse for Hgb<7 g/dL, hgb 6.3 today, chase transfusion w/ lasix -iron panel ordered-checking for Fe vs ESA needs  Metabolic acidosis -secondary to ESRD, start nahco3 for now--can be d/c'ed once on HD  Secondary hyperparathyroidism -resume phoslo  Recommendations conveyed to primary service.    [facility] 09/24/2021 5:09 PM   _____________________________________________________________________________________   History of Present Illness: Stanley [name] is a/an 45 y.o. male with a past medical history of CKD5, malignant HTN, metabolic acidosis, secondary hyperparathyroidism, dCHF, chronic anemia who presents to [facility] with worsening kidney function, SOB, weakness, chest discomfort. Has not been urinating as much anymore. Follows with Dr. [name] at our office, patient has been noncompliant with appointments and has been resistant in started HD. Dr. Stanford Breed with [facility] has scheduled the patient for AVG and [facility] placement for tomorrow. Patient does endorse that he has  been having worsening swelling, SOB, dysgeusia, and tremors.  Medications:  No current facility-administered medications for this encounter.   Current Outpatient Medications  Medication Sig Dispense Refill   acetaminophen (TYLENOL) 500 MG tablet Take 1,000 mg by mouth as needed for mild pain.     albuterol (PROAIR HFA) 108 (90 Base) MCG/ACT inhaler Inhale 1-2 puffs into the lungs every 6 (six) hours as needed for wheezing or shortness of breath. (Patient taking differently: Inhale 1-2 puffs into the lungs as needed for wheezing or shortness of breath.) 18 g 11   amLODipine (NORVASC) 10 MG tablet Take 1 tablet (10 mg total) by mouth daily. 90 tablet 3   aspirin EC 81 MG tablet Take 1 tablet (81 mg total) by mouth daily. 100 tablet 1   atorvastatin (LIPITOR) 40 MG tablet Take 1 tablet (40 mg total) by mouth daily. 30 tablet 11   calcium acetate (PHOSLO) 667 MG tablet Take 1 tablet (667 mg total) by mouth 3 (three) times daily with meals. 90 tablet 4   cloNIDine (CATAPRES) 0.1 MG tablet Take 1 tablet (0.1 mg total) by mouth 2 (two) times daily. 60 tablet 0   doxazosin (CARDURA) 8 MG tablet Take 2 tablets (16 mg total) by mouth daily. NEED OV. 751 tablet 0   folic acid (FOLVITE) 025 MCG tablet Take 0.5 tablets (400 mcg total) by mouth daily. 30 tablet 11   furosemide (LASIX) 80 MG tablet Take 2 tablets (160 mg total) by mouth 2 (two) times daily. 120 tablet 1   hydrALAZINE (APRESOLINE) 100 MG tablet TAKE 1 TABLET BY MOUTH THREE TIMES DAILY 90 tablet 0   labetalol (NORMODYNE) 200 MG tablet TAKE 3 TABLETS BY MOUTH THREE TIMES DAILY 270 tablet 0   Melatonin 10 MG CAPS Take  1 tablet by mouth daily.     Multiple Vitamin (MULTIVITAMIN WITH MINERALS) TABS tablet Take 1 tablet by mouth daily.     sodium bicarbonate 650 MG tablet Take 2 tablets (1,300 mg total) by mouth 2 (two) times daily. 120 tablet 3   traZODone (DESYREL) 100 MG tablet Take 1 tablet (100 mg total) by mouth at bedtime. 30 tablet 0    ondansetron (ZOFRAN ODT) 4 MG disintegrating tablet Take 1 tablet (4 mg total) by mouth every 8 (eight) hours as needed for nausea or vomiting. 30 tablet 3     ALLERGIES Lactose intolerance (gi), Nitroglycerin, Other, and Isosorbide  MEDICAL HISTORY Past Medical History:  Diagnosis Date   Anxiety    Chronic kidney disease (CKD), stage V ([place]) 11/2020   CKD (chronic kidney disease), stage IV ([facility])    Depression    Dyslexia    Heart palpitations    High cholesterol    Hypertension    Hypertensive urgency    Polysubstance (including opioids) dependence, binge pattern ([place]) 08/2020   Stroke ([place]) 06/03/2017   "minor; completely recovered today" (06/13/2017)   Tobacco abuse      SOCIAL HISTORY Social History   Socioeconomic History   Marital status: Married    Spouse name: Not on file   Number of children: Not on file   Years of education: Not on file   Highest education level: Not on file  Occupational History   Not on file  Tobacco Use   Smoking status: Former    Packs/day: 1.00    Years: 30.00    Pack years: 30.00    Types: Cigarettes   Smokeless tobacco: Never  Vaping Use   Vaping Use: Never used  Substance and Sexual Activity   Alcohol use: No   Drug use: Yes    Types: Marijuana    Comment: 06/13/2017 "not often"   Sexual activity: Not Currently  Other Topics Concern   Not on file  Social History Narrative   Not on file   Social Determinants of Health   Financial Resource Strain: Not on file  Food Insecurity: Not on file  Transportation Needs: Not on file  Physical Activity: Not on file  Stress: Not on file  Social Connections: Not on file  Intimate Partner Violence: Not on file     FAMILY HISTORY Family History  Problem Relation Age of Onset   Hypertension Mother    Hypertension Brother    Diabetes type II Brother      Review of Systems: 12 systems reviewed Otherwise as per HPI, all other systems reviewed and negative  Physical  Exam: Vitals:   09/24/21 1630 09/24/21 1639  BP: (!) 244/123 (!) 244/123  Pulse: 62   Resp: 19   Temp:    SpO2: 96%    No intake/output data recorded. No intake or output data in the 24 hours ending 09/24/21 1709 General: no acute distress HEENT: anicteric sclera, oropharynx clear without lesions CV: regular rate, normal rhythm, no murmurs, no gallops, no rubs Lungs: bibasilar rales, increased WOB Abd: soft, non-tender, non-distended Skin: no visible lesions or rashes Ext: 3+ pitting edema b/l LEs Neuro: normal speech, no gross focal deficits, tremulous  Test Results Reviewed Lab Results  Component Value Date   NA 141 09/24/2021   K 5.4 (H) 09/24/2021   CL 113 (H) 09/24/2021   CO2 13 (L) 09/24/2021   BUN 94 (H) 09/24/2021   CREATININE 10.93 (H) 09/24/2021   CALCIUM 7.2 (L)  09/24/2021   ALBUMIN 4.6 03/29/2021   PHOS 4.7 (H) 11/13/2020     I have reviewed all relevant outside healthcare records related to the patient's kidney injury.

## 2021-09-24 NOTE — ED Provider Notes (Signed)
Emergency Medicine Provider Triage Evaluation Note  Stanley [name] , a 45 y.o. male  was evaluated in triage.  Pt complains of worsening shortness of breath, occasional chest pain, patient reports the chest pain was last 2 days ago.  Patient also reports worsening fatigue, and occasional blurry vision especially with standing.  Patient reports severely uncontrolled blood pressure, blood pressure is systolic of 704 on arrival today.  Patient reports that he is been seen, and knows that he needs to start dialysis for kidney disease that is not secondary to diabetes, but he has been avoiding it.  Patient also endorsed significant swelling of bilateral lower extremities, shortness of breath worse with laying down.  Patient endorses history of tobacco use, no current tobacco use. Not currently taking any medication.  Review of Systems  Positive: Sob, chest pain, blurry vision Negative: Fever, abdominal pain  Physical Exam  BP (!) 228/112 (BP Location: Left Arm)    Pulse 64    Temp 98.7 F (37.1 C) (Oral)    Resp 18    SpO2 95%  Gen:   Awake, no distress   Resp:  Normal effort, crackles throughout MSK:   Moves extremities without difficulty, bilateral LE edema Other:  Some scleral icterus noted  Medical Decision Making  Medically screening exam initiated at 12:27 PM.  Appropriate orders placed.  Issa S Eder was informed that the remainder of the evaluation will be completed by another provider, this initial triage assessment does not replace that evaluation, and the importance of remaining in the ED until their evaluation is complete.  Known CKD with need for dialysis that has not started dialysis yet, multiple complaints 2/2 this   Dorien Chihuahua 09/24/21 1231    Carmin Muskrat, MD 09/24/21 1414

## 2021-09-24 NOTE — ED Triage Notes (Signed)
Pt was sent here by PCP for eval to start dialysis and get dialysis cath placed. Pt c/o bilateral leg swelling L worse than R, pain w/ walking, and shortness of breath for 1.5 wks.

## 2021-09-24 NOTE — Progress Notes (Signed)
I was asked by Dr. Almyra Free to admit this patient to the hospitalist service.  Patient seen briefly at bedside.  He reports shortness of breath and has been going on for the past few days.  He also noticed lower extremity swelling for the past 2 weeks.  He denies any chest pain.  He has a history of chronic kidney disease being worked up as an outpatient for initiation of dialysis.  Vital signs reviewed which showed blood pressures consistently above 916B systolic without any value below that.  His blood work shows a BNP of 2528, high-sensitivity troponin is 34 >> 35.  He has in acute on chronic renal failure for which vascular surgery and nephrology were consulted already.  Chest x-ray consistent with acute pulmonary edema.  His presentation is consistent with hypertensive emergency. Given current vital signs, floor admission is unsafe at this time.  I have discussed with Dr. Almyra Free, if blood pressure could be controlled/improved with oral/IV PRNs perhaps 846 or less systolic to page [place] back to admit to progressive, otherwise he may need ICU level of care.  Priscella Donna M. Cruzita Lederer, MD, PhD [facility]  Between 7 am - 7 pm you can contact me via Amion (for emergencies) or [place] (non urgent matters).  I am not available 7 pm - 7 am, please contact night coverage MD/APP via Amion

## 2021-09-25 ENCOUNTER — Encounter (HOSPITAL_COMMUNITY): Payer: Self-pay | Admitting: Internal Medicine

## 2021-09-25 ENCOUNTER — Inpatient Hospital Stay (HOSPITAL_COMMUNITY): Payer: Medicaid Other

## 2021-09-25 ENCOUNTER — Inpatient Hospital Stay (HOSPITAL_COMMUNITY): Payer: Medicaid Other | Admitting: Certified Registered"

## 2021-09-25 ENCOUNTER — Encounter (HOSPITAL_COMMUNITY)
Admission: EM | Disposition: A | Discharge status: Home / Self Care | Payer: Self-pay | Source: Ambulatory Visit | Attending: Internal Medicine

## 2021-09-25 DIAGNOSIS — E875 Hyperkalemia: Secondary | ICD-10-CM

## 2021-09-25 DIAGNOSIS — D631 Anemia in chronic kidney disease: Secondary | ICD-10-CM | POA: Diagnosis present

## 2021-09-25 DIAGNOSIS — F419 Anxiety disorder, unspecified: Secondary | ICD-10-CM | POA: Diagnosis present

## 2021-09-25 DIAGNOSIS — R0902 Hypoxemia: Secondary | ICD-10-CM | POA: Diagnosis not present

## 2021-09-25 DIAGNOSIS — I132 Hypertensive heart and chronic kidney disease with heart failure and with stage 5 chronic kidney disease, or end stage renal disease: Secondary | ICD-10-CM | POA: Diagnosis present

## 2021-09-25 DIAGNOSIS — E739 Lactose intolerance, unspecified: Secondary | ICD-10-CM | POA: Diagnosis present

## 2021-09-25 DIAGNOSIS — Z7982 Long term (current) use of aspirin: Secondary | ICD-10-CM | POA: Diagnosis not present

## 2021-09-25 DIAGNOSIS — E872 Acidosis, unspecified: Secondary | ICD-10-CM | POA: Diagnosis present

## 2021-09-25 DIAGNOSIS — Z833 Family history of diabetes mellitus: Secondary | ICD-10-CM | POA: Diagnosis not present

## 2021-09-25 DIAGNOSIS — Z87891 Personal history of nicotine dependence: Secondary | ICD-10-CM | POA: Diagnosis not present

## 2021-09-25 DIAGNOSIS — J810 Acute pulmonary edema: Secondary | ICD-10-CM | POA: Diagnosis present

## 2021-09-25 DIAGNOSIS — N186 End stage renal disease: Secondary | ICD-10-CM | POA: Diagnosis present

## 2021-09-25 DIAGNOSIS — R9431 Abnormal electrocardiogram [ECG] [EKG]: Secondary | ICD-10-CM | POA: Diagnosis present

## 2021-09-25 DIAGNOSIS — I161 Hypertensive emergency: Secondary | ICD-10-CM | POA: Diagnosis present

## 2021-09-25 DIAGNOSIS — N2581 Secondary hyperparathyroidism of renal origin: Secondary | ICD-10-CM | POA: Diagnosis present

## 2021-09-25 DIAGNOSIS — Z992 Dependence on renal dialysis: Secondary | ICD-10-CM

## 2021-09-25 DIAGNOSIS — Z20822 Contact with and (suspected) exposure to covid-19: Secondary | ICD-10-CM | POA: Diagnosis present

## 2021-09-25 DIAGNOSIS — Z8249 Family history of ischemic heart disease and other diseases of the circulatory system: Secondary | ICD-10-CM | POA: Diagnosis not present

## 2021-09-25 DIAGNOSIS — R0602 Shortness of breath: Secondary | ICD-10-CM | POA: Diagnosis present

## 2021-09-25 DIAGNOSIS — J9601 Acute respiratory failure with hypoxia: Secondary | ICD-10-CM | POA: Diagnosis not present

## 2021-09-25 DIAGNOSIS — E669 Obesity, unspecified: Secondary | ICD-10-CM | POA: Diagnosis present

## 2021-09-25 DIAGNOSIS — E8770 Fluid overload, unspecified: Secondary | ICD-10-CM | POA: Diagnosis present

## 2021-09-25 DIAGNOSIS — G4733 Obstructive sleep apnea (adult) (pediatric): Secondary | ICD-10-CM | POA: Diagnosis present

## 2021-09-25 DIAGNOSIS — Z79899 Other long term (current) drug therapy: Secondary | ICD-10-CM | POA: Diagnosis not present

## 2021-09-25 DIAGNOSIS — E7800 Pure hypercholesterolemia, unspecified: Secondary | ICD-10-CM | POA: Diagnosis present

## 2021-09-25 DIAGNOSIS — I5032 Chronic diastolic (congestive) heart failure: Secondary | ICD-10-CM | POA: Diagnosis present

## 2021-09-25 HISTORY — PX: INSERTION OF DIALYSIS CATHETER: SHX1324

## 2021-09-25 LAB — TYPE AND SCREEN
ABO/RH(D): O POS
Antibody Screen: NEGATIVE
Unit division: 0

## 2021-09-25 LAB — RENAL FUNCTION PANEL
Albumin: 3.1 g/dL — ABNORMAL LOW (ref 3.5–5.0)
Albumin: 3.3 g/dL — ABNORMAL LOW (ref 3.5–5.0)
Anion gap: 13 (ref 5–15)
Anion gap: 14 (ref 5–15)
BUN: 92 mg/dL — ABNORMAL HIGH (ref 6–20)
BUN: 94 mg/dL — ABNORMAL HIGH (ref 6–20)
CO2: 14 mmol/L — ABNORMAL LOW (ref 22–32)
CO2: 15 mmol/L — ABNORMAL LOW (ref 22–32)
Calcium: 6.9 mg/dL — ABNORMAL LOW (ref 8.9–10.3)
Calcium: 7.4 mg/dL — ABNORMAL LOW (ref 8.9–10.3)
Chloride: 110 mmol/L (ref 98–111)
Chloride: 113 mmol/L — ABNORMAL HIGH (ref 98–111)
Creatinine, Ser: 10.85 mg/dL — ABNORMAL HIGH (ref 0.61–1.24)
Creatinine, Ser: 10.93 mg/dL — ABNORMAL HIGH (ref 0.61–1.24)
GFR, Estimated: 5 mL/min — ABNORMAL LOW (ref >60)
GFR, Estimated: 5 mL/min — ABNORMAL LOW (ref >60)
Glucose, Bld: 91 mg/dL (ref 70–99)
Glucose, Bld: 81 mg/dL (ref 70–99)
Phosphorus: 6.5 mg/dL — ABNORMAL HIGH (ref 2.5–4.6)
Phosphorus: 6.6 mg/dL — ABNORMAL HIGH (ref 2.5–4.6)
Potassium: 4.2 mmol/L (ref 3.5–5.1)
Potassium: 4.4 mmol/L (ref 3.5–5.1)
Sodium: 137 mmol/L (ref 135–145)
Sodium: 142 mmol/L (ref 135–145)

## 2021-09-25 LAB — HEPATITIS B SURFACE ANTIGEN: Hepatitis B Surface Ag: NONREACTIVE

## 2021-09-25 LAB — CBC
HCT: 21.5 % — ABNORMAL LOW (ref 39.0–52.0)
HCT: 22.7 % — ABNORMAL LOW (ref 39.0–52.0)
Hemoglobin: 6.7 g/dL — CL (ref 13.0–17.0)
Hemoglobin: 7.2 g/dL — ABNORMAL LOW (ref 13.0–17.0)
MCH: 29.9 pg (ref 26.0–34.0)
MCH: 29.9 pg (ref 26.0–34.0)
MCHC: 31.2 g/dL (ref 30.0–36.0)
MCHC: 31.7 g/dL (ref 30.0–36.0)
MCV: 96 fL (ref 80.0–100.0)
MCV: 94.2 fL (ref 80.0–100.0)
Platelets: 197 10*3/uL (ref 150–400)
Platelets: 215 10*3/uL (ref 150–400)
RBC: 2.24 MIL/uL — ABNORMAL LOW (ref 4.22–5.81)
RBC: 2.41 MIL/uL — ABNORMAL LOW (ref 4.22–5.81)
RDW: 16.5 % — ABNORMAL HIGH (ref 11.5–15.5)
RDW: 17 % — ABNORMAL HIGH (ref 11.5–15.5)
WBC: 4.8 10*3/uL (ref 4.0–10.5)
WBC: 5.7 10*3/uL (ref 4.0–10.5)
nRBC: 0 % (ref 0.0–0.2)
nRBC: 0 % (ref 0.0–0.2)

## 2021-09-25 LAB — BPAM RBC
Blood Product Expiration Date: 202301142359
ISSUE DATE / TIME: 202212232041
Unit Type and Rh: 5100

## 2021-09-25 LAB — ALBUMIN: Albumin: 3.2 g/dL — ABNORMAL LOW (ref 3.5–5.0)

## 2021-09-25 LAB — MAGNESIUM: Magnesium: 1.9 mg/dL (ref 1.7–2.4)

## 2021-09-25 LAB — CBG MONITORING, ED: Glucose-Capillary: 78 mg/dL (ref 70–99)

## 2021-09-25 LAB — HEPATITIS B SURFACE ANTIBODY,QUALITATIVE: Hep B S Ab: NONREACTIVE

## 2021-09-25 SURGERY — INSERTION OF DIALYSIS CATHETER
Anesthesia: General | Site: Neck | Laterality: Right

## 2021-09-25 MED ORDER — PROPOFOL 10 MG/ML IV BOLUS
INTRAVENOUS | Status: AC
  Filled 2021-09-25: qty 20

## 2021-09-25 MED ORDER — LIDOCAINE 2% (20 MG/ML) 5 ML SYRINGE
INTRAMUSCULAR | Status: DC | PRN
  Administered 2021-09-25: 40 mg via INTRAVENOUS
  Administered 2021-09-25: 60 mg via INTRAVENOUS

## 2021-09-25 MED ORDER — SODIUM CHLORIDE 0.9 % IV SOLN: INTRAVENOUS | Status: DC

## 2021-09-25 MED ORDER — HEPARIN SODIUM (PORCINE) 1000 UNIT/ML IJ SOLN
INTRAMUSCULAR | Status: DC | PRN
  Administered 2021-09-25: 1000 [IU] via INTRAVENOUS

## 2021-09-25 MED ORDER — CEFAZOLIN SODIUM-DEXTROSE 2-3 GM-%(50ML) IV SOLR
INTRAVENOUS | Status: DC | PRN
  Administered 2021-09-25: 2 g via INTRAVENOUS

## 2021-09-25 MED ORDER — FENTANYL CITRATE (PF) 250 MCG/5ML IJ SOLN
INTRAMUSCULAR | Status: AC
  Filled 2021-09-25: qty 5

## 2021-09-25 MED ORDER — LIDOCAINE-EPINEPHRINE (PF) 1 %-1:200000 IJ SOLN
INTRAMUSCULAR | Status: AC
  Filled 2021-09-25: qty 30

## 2021-09-25 MED ORDER — CEFAZOLIN SODIUM-DEXTROSE 2-4 GM/100ML-% IV SOLN
INTRAVENOUS | Status: AC
  Filled 2021-09-25: qty 100

## 2021-09-25 MED ORDER — ONDANSETRON HCL 4 MG/2ML IJ SOLN
INTRAMUSCULAR | Status: DC | PRN
  Administered 2021-09-25: 4 mg via INTRAVENOUS

## 2021-09-25 MED ORDER — ONDANSETRON HCL 4 MG/2ML IJ SOLN: 4.0000 mg | Freq: Once | INTRAMUSCULAR | Status: DC | PRN

## 2021-09-25 MED ORDER — DEXAMETHASONE SODIUM PHOSPHATE 10 MG/ML IJ SOLN
INTRAMUSCULAR | Status: AC
  Filled 2021-09-25: qty 1

## 2021-09-25 MED ORDER — CHLORHEXIDINE GLUCONATE 0.12 % MT SOLN: 15.0000 mL | Freq: Once | OROMUCOSAL | Status: AC

## 2021-09-25 MED ORDER — 0.9 % SODIUM CHLORIDE (POUR BTL) OPTIME
TOPICAL | Status: DC | PRN
  Administered 2021-09-25: 09:00:00 1000 mL

## 2021-09-25 MED ORDER — PROPOFOL 10 MG/ML IV BOLUS
INTRAVENOUS | Status: DC | PRN
  Administered 2021-09-25: 200 mg via INTRAVENOUS

## 2021-09-25 MED ORDER — ATORVASTATIN CALCIUM 40 MG PO TABS
40.0000 mg | ORAL_TABLET | Freq: Every day | ORAL | Status: DC
  Administered 2021-09-26 – 2021-09-30 (×5): 40 mg via ORAL
  Filled 2021-09-25 (×5): qty 1

## 2021-09-25 MED ORDER — ACETAMINOPHEN 650 MG RE SUPP: 650.0000 mg | Freq: Four times a day (QID) | RECTAL | Status: DC | PRN

## 2021-09-25 MED ORDER — FENTANYL CITRATE (PF) 250 MCG/5ML IJ SOLN
INTRAMUSCULAR | Status: DC | PRN
  Administered 2021-09-25 (×2): 50 ug via INTRAVENOUS

## 2021-09-25 MED ORDER — ONDANSETRON HCL 4 MG/2ML IJ SOLN
INTRAMUSCULAR | Status: AC
  Filled 2021-09-25: qty 2

## 2021-09-25 MED ORDER — HYDRALAZINE HCL 20 MG/ML IJ SOLN
INTRAMUSCULAR | Status: AC
  Filled 2021-09-25: qty 1

## 2021-09-25 MED ORDER — DEXAMETHASONE SODIUM PHOSPHATE 10 MG/ML IJ SOLN
INTRAMUSCULAR | Status: DC | PRN
  Administered 2021-09-25: 5 mg via INTRAVENOUS

## 2021-09-25 MED ORDER — FENTANYL CITRATE (PF) 100 MCG/2ML IJ SOLN: 25.0000 ug | INTRAMUSCULAR | Status: DC | PRN

## 2021-09-25 MED ORDER — ACETAMINOPHEN 325 MG PO TABS
650.0000 mg | ORAL_TABLET | Freq: Four times a day (QID) | ORAL | Status: DC | PRN
  Administered 2021-09-26 – 2021-09-27 (×5): 650 mg via ORAL
  Filled 2021-09-25 (×5): qty 2

## 2021-09-25 MED ORDER — MIDAZOLAM HCL 2 MG/2ML IJ SOLN
INTRAMUSCULAR | Status: AC
  Filled 2021-09-25: qty 2

## 2021-09-25 MED ORDER — HYDRALAZINE HCL 20 MG/ML IJ SOLN: INTRAMUSCULAR | Status: DC | PRN

## 2021-09-25 MED ORDER — AMLODIPINE BESYLATE 10 MG PO TABS
10.0000 mg | ORAL_TABLET | Freq: Every day | ORAL | Status: DC
  Administered 2021-09-25 – 2021-09-30 (×6): 10 mg via ORAL
  Filled 2021-09-25 (×2): qty 1
  Filled 2021-09-25: qty 2
  Filled 2021-09-25 (×3): qty 1

## 2021-09-25 MED ORDER — LIDOCAINE 2% (20 MG/ML) 5 ML SYRINGE
INTRAMUSCULAR | Status: AC
  Filled 2021-09-25: qty 5

## 2021-09-25 MED ORDER — DARBEPOETIN ALFA 60 MCG/0.3ML IJ SOSY: 60.0000 ug | PREFILLED_SYRINGE | INTRAMUSCULAR | Status: DC

## 2021-09-25 MED ORDER — HEPARIN 6000 UNIT IRRIGATION SOLUTION
Status: DC | PRN
  Administered 2021-09-25: 1

## 2021-09-25 MED ORDER — LABETALOL HCL 5 MG/ML IV SOLN
5.0000 mg | INTRAVENOUS | Status: DC | PRN
Start: 2021-09-25 — End: 2021-09-29
  Administered 2021-09-25 – 2021-09-26 (×3): 20 mg via INTRAVENOUS
  Administered 2021-09-27: 12:00:00 10 mg via INTRAVENOUS
  Administered 2021-09-27 – 2021-09-29 (×3): 20 mg via INTRAVENOUS
  Filled 2021-09-25 (×7): qty 4

## 2021-09-25 MED ORDER — CHLORHEXIDINE GLUCONATE 0.12 % MT SOLN
OROMUCOSAL | Status: AC
  Administered 2021-09-25: 07:00:00 15 mL via OROMUCOSAL
  Filled 2021-09-25: qty 15

## 2021-09-25 MED ORDER — HYDRALAZINE HCL 50 MG PO TABS
100.0000 mg | ORAL_TABLET | Freq: Three times a day (TID) | ORAL | Status: DC
  Administered 2021-09-25 – 2021-09-30 (×15): 100 mg via ORAL
  Filled 2021-09-25 (×15): qty 2

## 2021-09-25 MED ORDER — MIDAZOLAM HCL 2 MG/2ML IJ SOLN
INTRAMUSCULAR | Status: DC | PRN
  Administered 2021-09-25: 1 mg via INTRAVENOUS

## 2021-09-25 MED ORDER — HEPARIN 6000 UNIT IRRIGATION SOLUTION
Status: AC
  Filled 2021-09-25: qty 500

## 2021-09-25 MED ORDER — HEPARIN SODIUM (PORCINE) 1000 UNIT/ML IJ SOLN
INTRAMUSCULAR | Status: AC
  Filled 2021-09-25: qty 10

## 2021-09-25 MED ORDER — HYDRALAZINE HCL 20 MG/ML IJ SOLN
INTRAMUSCULAR | Status: DC | PRN
  Administered 2021-09-25: 10 mg via INTRAVENOUS

## 2021-09-25 MED ORDER — HYDRALAZINE HCL 20 MG/ML IJ SOLN
10.0000 mg | Freq: Once | INTRAMUSCULAR | Status: AC
  Administered 2021-09-25: 13:00:00 10 mg via INTRAVENOUS

## 2021-09-25 SURGICAL SUPPLY — 42 items
BIOPATCH RED 1 DISK 7.0 (GAUZE/BANDAGES/DRESSINGS) ×2 IMPLANT
BIOPATCH RED 1IN DISK 7.0MM (GAUZE/BANDAGES/DRESSINGS) ×1
CATH PALINDROME-P 19CM W/VT (CATHETERS) ×2 IMPLANT
CATH PALINDROME-P 23CM W/VT (CATHETERS) IMPLANT
CATH PALINDROME-P 28CM W/VT (CATHETERS) IMPLANT
CLOSURE WOUND 1/2 X4 (GAUZE/BANDAGES/DRESSINGS) ×1
COVER PROBE W GEL 5X96 (DRAPES) ×3 IMPLANT
COVER SURGICAL LIGHT HANDLE (MISCELLANEOUS) ×3 IMPLANT
DERMABOND ADVANCED (GAUZE/BANDAGES/DRESSINGS) ×2
DERMABOND ADVANCED .7 DNX12 (GAUZE/BANDAGES/DRESSINGS) IMPLANT
DRAPE C-ARM 42X72 X-RAY (DRAPES) ×3 IMPLANT
DRAPE CHEST BREAST 15X10 FENES (DRAPES) ×3 IMPLANT
GAUZE 4X4 16PLY ~~LOC~~+RFID DBL (SPONGE) ×3 IMPLANT
GAUZE SPONGE 4X4 12PLY STRL (GAUZE/BANDAGES/DRESSINGS) ×3 IMPLANT
GLOVE SURG POLYISO LF SZ8 (GLOVE) ×3 IMPLANT
GOWN STRL REUS W/ TWL LRG LVL3 (GOWN DISPOSABLE) ×1 IMPLANT
GOWN STRL REUS W/ TWL XL LVL3 (GOWN DISPOSABLE) ×1 IMPLANT
GOWN STRL REUS W/TWL LRG LVL3 (GOWN DISPOSABLE) ×3
GOWN STRL REUS W/TWL XL LVL3 (GOWN DISPOSABLE) ×3
KIT BASIN OR (CUSTOM PROCEDURE TRAY) ×3 IMPLANT
KIT PALINDROME-P 55CM (CATHETERS) IMPLANT
KIT TURNOVER KIT B (KITS) ×3 IMPLANT
NDL 18GX1X1/2 (RX/OR ONLY) (NEEDLE) ×1 IMPLANT
NDL HYPO 25GX1X1/2 BEV (NEEDLE) ×1 IMPLANT
NEEDLE 18GX1X1/2 (RX/OR ONLY) (NEEDLE) ×3 IMPLANT
NEEDLE HYPO 25GX1X1/2 BEV (NEEDLE) ×3 IMPLANT
NS IRRIG 1000ML POUR BTL (IV SOLUTION) ×3 IMPLANT
PACK CV ACCESS (CUSTOM PROCEDURE TRAY) ×2 IMPLANT
PACK SURGICAL SETUP 50X90 (CUSTOM PROCEDURE TRAY) ×3 IMPLANT
PAD ARMBOARD 7.5X6 YLW CONV (MISCELLANEOUS) ×6 IMPLANT
SET MICROPUNCTURE 5F STIFF (MISCELLANEOUS) ×3 IMPLANT
SOAP 2 % CHG 4 OZ (WOUND CARE) ×3 IMPLANT
STRIP CLOSURE SKIN 1/2X4 (GAUZE/BANDAGES/DRESSINGS) ×2 IMPLANT
SUT ETHILON 3 0 PS 1 (SUTURE) ×3 IMPLANT
SUT MNCRL AB 4-0 PS2 18 (SUTURE) ×3 IMPLANT
SYR 10ML LL (SYRINGE) ×3 IMPLANT
SYR 20ML LL LF (SYRINGE) ×6 IMPLANT
SYR 5ML LL (SYRINGE) ×3 IMPLANT
SYR CONTROL 10ML LL (SYRINGE) ×3 IMPLANT
TOWEL GREEN STERILE (TOWEL DISPOSABLE) ×3 IMPLANT
TOWEL GREEN STERILE FF (TOWEL DISPOSABLE) ×6 IMPLANT
WATER STERILE IRR 1000ML POUR (IV SOLUTION) ×3 IMPLANT

## 2021-09-25 NOTE — Progress Notes (Addendum)
PROGRESS NOTE    Stanley [name]  ZOX:[id number] DOB: 06-25-1976 DOA: 09/24/2021 PCP: Elsie Stain, MD   Brief Narrative:  Stanley [name] is a 45 y.o. male with medical history significant of chronic malignant hypertension, medical noncompliance, CKD stage V, OSA, chronic diastolic CHF.  He has had deterioration of his renal function and sent to the ED by his PCP/nephrology to be started on dialysis.  Patient complained of shortness of breath, chest pain, and bilateral lower extremity edema.    In the ED, not febrile or tachycardic.  Not hypoxic.  Blood pressure significantly elevated with systolic in the 811B and diastolic in the 147W.  Labs showing no leukocytosis.  Hemoglobin 6.3, was previously in the 8-10 range.  Potassium 5.4.  Bicarb 13.  BUN 94, creatinine 10.9, GFR 5.  Creatinine was 6.8 in February 2022 and 9.0 in June 2022.  Calcium 7.2.  High-sensitivity troponin 34 >35.  BNP 2528.  COVID and influenza PCR negative.  Chest x-ray showing pulmonary edema.  Patient does not have dialysis access.  Nephrology and vascular surgery consulted.  Plan for initiation of dialysis after placement of left upper extremity AV graft and tunneled dialysis catheter in the morning.  Nephrology recommended starting IV Lasix 120 mg twice daily until stable on HD.  He was given [facility] for hyperkalemia and also expected to improve with Lasix.  1 unit PRBCs given for anemia.  Started on bicarb supplementation for metabolic acidosis.  Blood pressure continued to be significantly elevated with systolic in the 295A despite administration of multiple medications including hydralazine, clonidine, and labetalol.  ED physician discussed the case with PCCM MD who did not feel that the patient needed ICU admission, recommended starting diltiazem drip  Assessment & Plan:  CKD stage V now progressed to ESRD Volume overload Metabolic acidosis Mild hyperkalemia -Upon arrival: BUN 94, creatinine 10.9, GFR 5.  Creatinine was  6.8 in February 2022 and 9.0 in June 2022.  BNP 2528.   -Chest x-ray showing pulmonary edema.   -S/p [facility] placement on 12/24.  Patient became hypoxemic during the AV graft placement -Appreciate vascular surgery help. -Appreciate nephrology help.  Plan for initiation of dialysis  -Continue IV Lasix 120 mg twice daily per nephrology recommendations.  He was given Lokelma.  Continue bicarb supplementation and PhosLo.  Avoid nephrotoxic agents/contrast. -Monitor vitals closely -Strict INO's and daily weight.  Acute hypoxemic respiratory failure in the setting of fluid overload/pulmonary edema: -Patient is requiring 2-3 L of oxygen via nasal cannula.  Chest x-ray shows pulmonary edema. -Continue Lasix as above -On continuous pulse ox.  We will try to wean off of oxygen as tolerated.   Hypertensive emergency -Due to volume overload.   -Blood pressure significantly elevated on arrival to the ED with systolic in the 213Y and diastolic in the 865H.   -Continue diltiazem drip.  Continue amlodipine, hydralazine, and clonidine.   -Hopefully his blood pressure stabilizes after hemodialysis.    Chest pain -Likely secondary to fluid overload/anxiety -Patient reports a brief episode of left-sided chest pain on arrival to the ED, now resolved.  ACS less likely as high-sensitivity troponin is only mildly elevated and stable. -Continue to monitor   Anemia -Likely due to chronic renal disease.  Patient is not endorsing any symptoms of GI bleed.  Hemoglobin 6.3, was previously in the 8-10 range. -1 unit PRBCs administered in the ED. monitor H&H closely   QT prolongation -Cardiac monitoring.  Monitor potassium and magnesium levels.   -  Avoid QT prolonging medication  DVT prophylaxis: SCD/heparin  code Status: Full code Family Communication:  None present at bedside.  Plan of care discussed with patient in length and he verbalized understanding and agreed with it. Disposition Plan: To be  determined  Consultants:  Nephrology Vascular surgery  Procedures:  Dialysis catheter placement on 09/25/2021  Antimicrobials:  None  Status is: Inpatient     Subjective: Patient seen and examined in PACU.  S/p dialysis catheter placement this morning by vascular surgery.  On nasal cannula.  He denies any chest pain but reports some shortness of breath and leg swelling.  Reports some pain around dialysis catheter.  Objective: Vitals:   09/25/21 1316 09/25/21 1331 09/25/21 1346 09/25/21 1401  BP: (!) 201/100 (!) 203/101 (!) 201/107 (!) 208/110  Pulse: 66 66 67 80  Resp: 14 13 14 19   Temp:      TempSrc:      SpO2: 97% 95% 94% 94%  Weight:      Height:        Intake/Output Summary (Last 24 hours) at 09/25/2021 1404 Last data filed at 09/25/2021 1100 Gross per 24 hour  Intake 566.25 ml  Output 560 ml  Net 6.25 ml   Filed Weights   09/24/21 1513  Weight: 86.2 kg    Examination:  General exam: Appears calm and comfortable, appears weak, sick, pale, lethargic, on nasal cannula Respiratory system: Coarse bilateral Rales noted  cardiovascular system: S1 & S2 heard, RRR. No JVD, murmurs, rubs, gallops or clicks.  Bilateral 2+ pitting edema positive  gastrointestinal system: Abdomen is nondistended, soft and nontender. No organomegaly or masses felt. Normal bowel sounds heard. Central nervous system: Alert and oriented. No focal neurological deficits. Extremities: Symmetric 5 x 5 power.  Dialysis catheter on right upper chest.  Dressing dry and intact. Skin: No rashes, lesions or ulcers Psychiatry: Judgement and insight appear normal. Mood & affect appropriate.    Data Reviewed: I have personally reviewed following labs and imaging studies  CBC: Recent Labs  Lab 09/24/21 0109 09/24/21 1226 09/25/21 0608  WBC 4.8 4.9 5.7  HGB 6.7* 6.3* 7.2*  HCT 21.5* 20.6* 22.7*  MCV 96.0 96.7 94.2  PLT 197 215 194   Basic Metabolic Panel: Recent Labs  Lab 09/24/21 0109  09/24/21 1226 09/25/21 0608  NA 137 141 142  K 4.2 5.4* 4.4  CL 110 113* 113*  CO2 14* 13* 15*  GLUCOSE 91 83 81  BUN 92* 94* 94*  CREATININE 10.85* 10.93* 10.93*  CALCIUM 6.9* 7.2* 7.4*  MG  --   --  1.9  PHOS 6.5*  --  6.6*   GFR: Estimated Creatinine Clearance: 8.9 mL/min (A) (by C-G formula based on SCr of 10.93 mg/dL (H)). Liver Function Tests: Recent Labs  Lab 09/24/21 0109 09/25/21 0608  ALBUMIN 3.1* 3.3*   3.2*   No results for input(s): LIPASE, AMYLASE in the last 168 hours. No results for input(s): AMMONIA in the last 168 hours. Coagulation Profile: No results for input(s): INR, PROTIME in the last 168 hours. Cardiac Enzymes: No results for input(s): CKTOTAL, CKMB, CKMBINDEX, TROPONINI in the last 168 hours. BNP (last 3 results) No results for input(s): PROBNP in the last 8760 hours. HbA1C: No results for input(s): HGBA1C in the last 72 hours. CBG: Recent Labs  Lab 09/25/21 0539  GLUCAP 78   Lipid Profile: No results for input(s): CHOL, HDL, LDLCALC, TRIG, CHOLHDL, LDLDIRECT in the last 72 hours. Thyroid Function Tests: No results  for input(s): TSH, T4TOTAL, FREET4, T3FREE, THYROIDAB in the last 72 hours. Anemia Panel: Recent Labs    09/24/21 1915  FERRITIN 109  TIBC 284  IRON 56   Sepsis Labs: No results for input(s): PROCALCITON, LATICACIDVEN in the last 168 hours.  Recent Results (from the past 240 hour(s))  Resp Panel by RT-PCR (Flu A&B, Covid) Nasopharyngeal Swab     Status: None   Collection Time: 09/24/21  3:46 PM   Specimen: Nasopharyngeal Swab; Nasopharyngeal(NP) swabs in vial transport medium  Result Value Ref Range Status   SARS Coronavirus 2 by RT PCR NEGATIVE NEGATIVE Final    Comment: (NOTE) SARS-CoV-2 target nucleic acids are NOT DETECTED.  The SARS-CoV-2 RNA is generally detectable in upper respiratory specimens during the acute phase of infection. The lowest concentration of SARS-CoV-2 viral copies this assay can detect  is 138 copies/mL. A negative result does not preclude SARS-Cov-2 infection and should not be used as the sole basis for treatment or other patient management decisions. A negative result may occur with  improper specimen collection/handling, submission of specimen other than nasopharyngeal swab, presence of viral mutation(s) within the areas targeted by this assay, and inadequate number of viral copies(<138 copies/mL). A negative result must be combined with clinical observations, patient history, and epidemiological information. The expected result is Negative.  Fact Sheet for Patients:  EntrepreneurPulse.com.au  Fact Sheet for Healthcare Providers:  IncredibleEmployment.be  This test is no t yet approved or cleared by the [place] FDA and  has been authorized for detection and/or diagnosis of SARS-CoV-2 by FDA under an Emergency Use Authorization (EUA). This EUA will remain  in effect (meaning this test can be used) for the duration of the COVID-19 declaration under Section 564(b)(1) of the Act, 21 U.S.C.section 360bbb-3(b)(1), unless the authorization is terminated  or revoked sooner.       Influenza A by PCR NEGATIVE NEGATIVE Final   Influenza B by PCR NEGATIVE NEGATIVE Final    Comment: (NOTE) The Xpert Xpress SARS-CoV-2/FLU/RSV plus assay is intended as an aid in the diagnosis of influenza from Nasopharyngeal swab specimens and should not be used as a sole basis for treatment. Nasal washings and aspirates are unacceptable for Xpert Xpress SARS-CoV-2/FLU/RSV testing.  Fact Sheet for Patients: EntrepreneurPulse.com.au  Fact Sheet for Healthcare Providers: IncredibleEmployment.be  This test is not yet approved or cleared by the [place] FDA and has been authorized for detection and/or diagnosis of SARS-CoV-2 by FDA under an Emergency Use Authorization (EUA). This EUA will remain in effect  (meaning this test can be used) for the duration of the COVID-19 declaration under Section 564(b)(1) of the Act, 21 U.S.C. section 360bbb-3(b)(1), unless the authorization is terminated or revoked.  Performed at [facility], [place] [address] [place], [place] [address]       Radiology Studies: DG Chest 1 View  Result Date: 09/25/2021 CLINICAL DATA:  45 year old male central venous catheter. EXAM: CHEST  1 VIEW COMPARISON:  09/24/2021 radiographs and earlier. FINDINGS: Portable AP upright view at 0923 hours. Right chest IJ approach dual lumen dialysis type catheter in place with tips at the lower SVC level below the carina. No pneumothorax. Stable cardiac size and mediastinal contours. Visualized tracheal air column is within normal limits. Worsening bibasilar ventilation with new obscuration of the left hemidiaphragm and patchy increased right lung base opacity. Stable pulmonary vascular congestion, possible mild interstitial edema. No obvious pleural effusion. Paucity of bowel gas in the upper abdomen. No acute osseous abnormality  identified. IMPRESSION: 1. Right chest approach dual lumen dialysis type catheter placed with no adverse features. 2. Stable pulmonary vascular congestion with worsening bibasilar ventilation, including left lower lobe collapse or consolidation since yesterday. Electronically Signed   By: Genevie Ann M.D.   On: 09/25/2021 10:00   DG Chest 2 View  Result Date: 09/24/2021 CLINICAL DATA:  Chest pain. EXAM: CHEST - 2 VIEW COMPARISON:  11/09/2020 FINDINGS: Two-view exam shows subtle alveolar opacity in the mid and lower lungs bilaterally without substantial pleural effusion. The cardio pericardial silhouette is enlarged. The visualized bony structures of the thorax show no acute abnormality. IMPRESSION: Subtle alveolar opacity in the mid and lower lungs bilaterally suggesting edema. Electronically Signed   By: Misty Stanley M.D.   On: 09/24/2021 12:58   DG Chest Special  View  Result Date: 09/25/2021 CLINICAL DATA:  Dialysis catheter placement EXAM: CHEST SPECIAL VIEW COMPARISON:  09/24/2021 FLUOROSCOPY TIME:  0 minutes 20 seconds Dose: 4.93 mGy Images: 1 FINDINGS: RIGHT jugular dual-lumen central venous catheter with tip projecting over SVC. Visualized osseous structures unremarkable. IMPRESSION: Tip of dual lumen RIGHT jugular central venous catheter tip projects over SVC. Electronically Signed   By: Lavonia Dana M.D.   On: 09/25/2021 10:22   DG C-Arm 1-60 Min-No Report  Result Date: 09/25/2021 Fluoroscopy was utilized by the requesting physician.  No radiographic interpretation.    Scheduled Meds:  [MAR Hold] amLODipine  10 mg Oral Daily   [MAR Hold] atorvastatin  40 mg Oral Daily   [MAR Hold] calcium acetate  667 mg Oral TID WC   [MAR Hold] Chlorhexidine Gluconate Cloth  6 each Topical Q0600   [MAR Hold] cloNIDine  0.2 mg Oral TID   [START ON 10/02/2021] darbepoetin (ARANESP) injection - DIALYSIS  60 mcg Intravenous Q Sat-HD   hydrALAZINE       [MAR Hold] hydrALAZINE  100 mg Oral TID   [MAR Hold] sodium bicarbonate  1,300 mg Oral TID   Continuous Infusions:  [MAR Hold] sodium chloride     [MAR Hold] sodium chloride     [MAR Hold] sodium chloride Stopped (09/25/21 0050)   sodium chloride     diltiazem (CARDIZEM) infusion 15 mg/hr (09/25/21 0240)   [MAR Hold] furosemide Stopped (09/25/21 0038)     LOS: 0 days   Time spent: 35 minutes   Reesha Debes Loann Quill, MD [facility]  If 7PM-7AM, please contact night-coverage [email or web address] 09/25/2021, 2:04 PM

## 2021-09-25 NOTE — Anesthesia Procedure Notes (Signed)
Procedure Name: LMA Insertion Date/Time: 09/25/2021 8:10 AM Performed by: Barrington Ellison, CRNA Pre-anesthesia Checklist: Patient identified, Emergency Drugs available, Suction available and Patient being monitored Patient Re-evaluated:Patient Re-evaluated prior to induction Oxygen Delivery Method: Circle System Utilized Preoxygenation: Pre-oxygenation with 100% oxygen Induction Type: IV induction Ventilation: Mask ventilation without difficulty LMA: LMA inserted LMA Size: 5.0 Number of attempts: 1 Placement Confirmation: positive ETCO2 Tube secured with: Tape Dental Injury: Teeth and Oropharynx as per pre-operative assessment

## 2021-09-25 NOTE — ED Provider Notes (Addendum)
°  Received a phone call from the hospitalist, Dr. Marlowe Sax.  I was not involved in this patient's care.  Hospitalist stating that patient's blood pressure is too high for the stepdown unit.  Apparently he is to be initiated on dialysis tomorrow after getting access from vascular surgery.  Additionally he is significantly anemic and has been transfused.  He has been given multiple medicines for his blood pressure with minimal change.  A Cardizem drip was ordered at 2100; however, there was a delay in his getting started.  This was just recently started.  Prior to initiation, blood pressures 200s over 100s.  Apparently nephrology, vascular surgery, and ICU were consulted by my colleague.  ICU recommended the drip and that the patient did not need to come to the ICU.  I have requested the hospitalist contact ICU if she does not feel that the patient can go to the stepdown unit.  I have confirmed that the drip has now been initiated.  3:02 AM Dr. Marlowe Sax paged regarding updated vital signs. REquest admit bed or discussion with ICU.  Physical Exam  BP (!) 215/115 (BP Location: Right Arm)    Pulse 68    Temp 98.6 F (37 C) (Oral)    Resp 17    Ht 1.676 m (5\' 6" )    Wt 86.2 kg    SpO2 100%    BMI 30.67 kg/m         Merryl Hacker, MD 09/25/21 0122    Merryl Hacker, MD 09/25/21 [phone]

## 2021-09-25 NOTE — Progress Notes (Signed)
[facility] Progress Note    Assessment/ Plan:   CKD 5, now ESRD-has been needing to start HD for quite some time, patient has been resistant to this. Follows with Dr. Geralyn Flash. -s/p TDC placement 12/24, appreciate VVS. Hypoxia was a limiting factor for AVG--will be revisited -will start HD today-slow start protocol (tentative plan: HD 12/24, 12/26, 12/27) -will need CLIP for outpatient placement -Maintain MAP>65 for optimal renal perfusion.  -Agree with holding ACE-I, avoid further nephrotoxins including NSAIDS, Morphine.  Unless absolutely necessary, avoid CT with contrast and/or MRI with gadolinium.      SOB, pulm edema -lasix 120mg  BID until stable on HD   Hypertension -markedly hypertensive, chronic malignant HTN, resume home meds, will UF as tolerated. C/w lasix for now   Hyperkalemia -improved   Anemia due to chronic kidney disease: -Transfuse for Hgb<7 g/dL, hgb 6.3 on admit, s/p 1u prbc, hgb 7.2 this am -esa started   Metabolic acidosis -secondary to ESRD, start nahco3 for now--can be d/c'ed once on HD   Secondary hyperparathyroidism -resume phoslo    Subjective:   S/p tdc placement this am. Hypoxic when supine-unable to do avg today. Req'd dilt gtt overnight. Feels groggy post op, feels like he has to cough something out and its not coming out.    Objective:   BP (!) 188/104 (BP Location: Right Arm)    Pulse 61    Temp 97.8 F (36.6 C)    Resp 15    Ht 5\' 6"  (1.676 m)    Wt 86.2 kg    SpO2 96%    BMI 30.67 kg/m   Intake/Output Summary (Last 24 hours) at 09/25/2021 1158 Last data filed at 09/25/2021 1100 Gross per 24 hour  Intake 566.25 ml  Output 560 ml  Net 6.25 ml   Weight change:   Physical Exam: Gen:nad CVS:s1s2 Resp:bibasilar rales OZD:GUYQ Ext:2+ pitting edema bl le's Neuro: awake, alert Dialysis access: RIJ [facility]  Imaging: DG Chest 1 View  Result Date: 09/25/2021 CLINICAL DATA:  45 year old male central venous  catheter. EXAM: CHEST  1 VIEW COMPARISON:  09/24/2021 radiographs and earlier. FINDINGS: Portable AP upright view at 0923 hours. Right chest IJ approach dual lumen dialysis type catheter in place with tips at the lower SVC level below the carina. No pneumothorax. Stable cardiac size and mediastinal contours. Visualized tracheal air column is within normal limits. Worsening bibasilar ventilation with new obscuration of the left hemidiaphragm and patchy increased right lung base opacity. Stable pulmonary vascular congestion, possible mild interstitial edema. No obvious pleural effusion. Paucity of bowel gas in the upper abdomen. No acute osseous abnormality identified. IMPRESSION: 1. Right chest approach dual lumen dialysis type catheter placed with no adverse features. 2. Stable pulmonary vascular congestion with worsening bibasilar ventilation, including left lower lobe collapse or consolidation since yesterday. Electronically Signed   By: Genevie Ann M.D.   On: 09/25/2021 10:00   DG Chest 2 View  Result Date: 09/24/2021 CLINICAL DATA:  Chest pain. EXAM: CHEST - 2 VIEW COMPARISON:  11/09/2020 FINDINGS: Two-view exam shows subtle alveolar opacity in the mid and lower lungs bilaterally without substantial pleural effusion. The cardio pericardial silhouette is enlarged. The visualized bony structures of the thorax show no acute abnormality. IMPRESSION: Subtle alveolar opacity in the mid and lower lungs bilaterally suggesting edema. Electronically Signed   By: Misty Stanley M.D.   On: 09/24/2021 12:58   DG Chest Special View  Result Date: 09/25/2021 CLINICAL DATA:  Dialysis catheter placement  EXAM: CHEST SPECIAL VIEW COMPARISON:  09/24/2021 FLUOROSCOPY TIME:  0 minutes 20 seconds Dose: 4.93 mGy Images: 1 FINDINGS: RIGHT jugular dual-lumen central venous catheter with tip projecting over SVC. Visualized osseous structures unremarkable. IMPRESSION: Tip of dual lumen RIGHT jugular central venous catheter tip projects  over SVC. Electronically Signed   By: Lavonia Dana M.D.   On: 09/25/2021 10:22   DG C-Arm 1-60 Min-No Report  Result Date: 09/25/2021 Fluoroscopy was utilized by the requesting physician.  No radiographic interpretation.    Labs: BMET Recent Labs  Lab 09/24/21 0109 09/24/21 1226 09/25/21 0608  NA 137 141 142  K 4.2 5.4* 4.4  CL 110 113* 113*  CO2 14* 13* 15*  GLUCOSE 91 83 81  BUN 92* 94* 94*  CREATININE 10.85* 10.93* 10.93*  CALCIUM 6.9* 7.2* 7.4*  PHOS 6.5*  --  6.6*   CBC Recent Labs  Lab 09/24/21 0109 09/24/21 1226 09/25/21 0608  WBC 4.8 4.9 5.7  HGB 6.7* 6.3* 7.2*  HCT 21.5* 20.6* 22.7*  MCV 96.0 96.7 94.2  PLT 197 215 215    Medications:     [MAR Hold] amLODipine  10 mg Oral Daily   [MAR Hold] atorvastatin  40 mg Oral Daily   [MAR Hold] calcium acetate  667 mg Oral TID WC   [MAR Hold] Chlorhexidine Gluconate Cloth  6 each Topical Q0600   [MAR Hold] cloNIDine  0.2 mg Oral TID   [MAR Hold] hydrALAZINE  100 mg Oral TID   [MAR Hold] sodium bicarbonate  1,300 mg Oral TID      Gean Quint, MD [facility] 09/25/2021, 11:58 AM

## 2021-09-25 NOTE — Anesthesia Preprocedure Evaluation (Signed)
Anesthesia Evaluation  Patient identified by MRN, date of birth, ID band Patient awake    Reviewed: Allergy & Precautions, NPO status , Patient's Chart, lab work & pertinent test results  Airway Mallampati: II  TM Distance: >3 FB Neck ROM: Full    Dental no notable dental hx.    Pulmonary sleep apnea , former smoker,    Pulmonary exam normal breath sounds clear to auscultation       Cardiovascular hypertension, + Peripheral Vascular Disease  Normal cardiovascular exam Rhythm:Regular Rate:Normal     Neuro/Psych CVA, No Residual Symptoms negative psych ROS   GI/Hepatic negative GI ROS, (+)     substance abuse  ,   Endo/Other  negative endocrine ROS  Renal/GU ESRFRenal disease  negative genitourinary   Musculoskeletal negative musculoskeletal ROS (+)   Abdominal   Peds negative pediatric ROS (+)  Hematology negative hematology ROS (+)   Anesthesia Other Findings   Reproductive/Obstetrics negative OB ROS                             Anesthesia Physical Anesthesia Plan  ASA: 4  Anesthesia Plan: General   Post-op Pain Management:    Induction: Intravenous  PONV Risk Score and Plan: 2 and Ondansetron and Dexamethasone  Airway Management Planned: LMA  Additional Equipment:   Intra-op Plan:   Post-operative Plan: Extubation in OR  Informed Consent: I have reviewed the patients History and Physical, chart, labs and discussed the procedure including the risks, benefits and alternatives for the proposed anesthesia with the patient or authorized representative who has indicated his/her understanding and acceptance.     Dental advisory given  Plan Discussed with: CRNA and Surgeon  Anesthesia Plan Comments: (Severe hyperkalemia)        Anesthesia Quick Evaluation

## 2021-09-25 NOTE — Op Note (Signed)
DATE OF SERVICE: 09/25/2021  PATIENT:  Stanley [name]  45 y.o. male  PRE-OPERATIVE DIAGNOSIS:  ESRD  POST-OPERATIVE DIAGNOSIS:  Same  PROCEDURE:   Placement of right internal jugular tunneled dialysis catheter  SURGEON:  Surgeon(s) and Role:    * Cherre Robins, MD - Primary  ASSISTANT: none  ANESTHESIA:   general  EBL: minimal  BLOOD ADMINISTERED:none  DRAINS: none   LOCAL MEDICATIONS USED:  NONE  SPECIMEN:  none  COUNTS: confirmed correct.  TOURNIQUET:  none  PATIENT DISPOSITION:  PACU - hemodynamically stable.   Delay start of Pharmacological VTE agent (>24hrs) due to surgical blood loss or risk of bleeding: no  INDICATION FOR PROCEDURE: Stanley [name] is a 45 y.o. male with ESRD in need of dialysis access. After careful discussion of risks, benefits, and alternatives the patient was offered tunneled dialysis catheter and arteriovenous graft placement. The patient  understood and wished to proceed.  OPERATIVE FINDINGS: Patient hypoxemic while supine. Required significant mechanical support to get SpO2 > 90% (PEEP 10, manual bagging). I felt it unsafe to proceed with graft at this time. Unremarkable placement of RIJ TDC. OK to use immediately.  DESCRIPTION OF PROCEDURE: After identification of the patient in the pre-operative holding area, the patient was transferred to the operating room. The patient was positioned supine on the operating room table. Anesthesia was induced. The right neck and chest were prepped and draped in standard fashion. A surgical pause was performed confirming correct patient, procedure, and operative location.  Using ultrasound guidance the right internal jugular vein was accessed with micropuncture technique.  Through the micropuncture sheath a floppy J-wire was advanced into the superior vena cava.  A small incision was made around the skin access point.  The access point was serially dilated under direct fluoroscopic guidance.  A peel-away sheath  was introduced into the superior vena cava under fluoroscopic guidance.  A counterincision was made in the chest under the clavicle.  A 19 cm tunnel dialysis catheter was then tunneled under the skin, over the clavicle into the incision in the neck.  The tunneling device was removed and the catheter fed through the peel-away sheath into the superior vena cava.  The peel-away sheath was removed and the catheter gently pulled back.  Adequate position was confirmed with x-ray.  The catheter was tested and found to flush and draw back well.  Catheter was heparin locked.  Caps were applied.  Catheter was sutured to the skin.  The neck incision was closed with 4-0 Monocryl.  Upon completion of the case instrument and sharps counts were confirmed correct. The patient was transferred to the PACU in good condition. I was present for all portions of the procedure.  Stanley Aline. Stanford Breed, MD [facility] Phone Number: [phone] 09/25/2021 8:43 AM

## 2021-09-25 NOTE — ED Notes (Signed)
Patients mother Rosie Golson would like an update [phone]

## 2021-09-25 NOTE — Anesthesia Postprocedure Evaluation (Signed)
Anesthesia Post Note  Patient: Stanley [name]  Procedure(s) Performed: INSERTION OF 19 sm [place] PRECISION CHRONIC CATHETER (Right: Neck)     Patient location during evaluation: PACU Anesthesia Type: General Level of consciousness: awake and alert Pain management: pain level controlled Vital Signs Assessment: post-procedure vital signs reviewed and stable Respiratory status: spontaneous breathing, nonlabored ventilation, respiratory function stable and patient connected to nasal cannula oxygen Cardiovascular status: blood pressure returned to baseline and stable Postop Assessment: no apparent nausea or vomiting Anesthetic complications: no   No notable events documented.  Last Vitals:  Vitals:   09/25/21 0946 09/25/21 1001  BP: (!) 188/104 (!) 187/98  Pulse: (!) 58 (!) 57  Resp: 16 17  Temp:    SpO2: 94% 95%    Last Pain:  Vitals:   09/25/21 1001  TempSrc:   PainSc: Asleep                 Damaya Channing S

## 2021-09-25 NOTE — H&P (Signed)
History and Physical    Stanley [name] ZOX:[id number] DOB: 03-06-1976 DOA: 09/24/2021  PCP: Elsie Stain, MD Patient coming from: Home  Chief Complaint: Shortness of breath  HPI: Stanley [name] is a 45 y.o. male with medical history significant of chronic malignant hypertension, medical noncompliance, CKD stage V, OSA, chronic diastolic CHF.  He has had deterioration of his renal function and sent to the ED by his PCP/nephrology to be started on dialysis.  Patient complained of shortness of breath, chest pain, and bilateral lower extremity edema.  In the ED, not febrile or tachycardic.  Not hypoxic.  Blood pressure significantly elevated with systolic in the 811B and diastolic in the 147W.  Labs showing no leukocytosis.  Hemoglobin 6.3, was previously in the 8-10 range.  Potassium 5.4.  Bicarb 13.  BUN 94, creatinine 10.9, GFR 5.  Creatinine was 6.8 in February 2022 and 9.0 in June 2022.  Calcium 7.2.  High-sensitivity troponin 34 >35.  BNP 2528.  COVID and influenza PCR negative.  Chest x-ray showing pulmonary edema.  Patient does not have dialysis access.  Nephrology and vascular surgery consulted.  Plan for initiation of dialysis after placement of left upper extremity AV graft and tunneled dialysis catheter in the morning.  Nephrology recommended starting IV Lasix 120 mg twice daily until stable on HD.  He was given [facility] for hyperkalemia and also expected to improve with Lasix.  1 unit PRBCs given for anemia.  Started on bicarb supplementation for metabolic acidosis.  Blood pressure continued to be significantly elevated with systolic in the 295A despite administration of multiple medications including hydralazine, clonidine, and labetalol.  ED physician discussed the case with PCCM MD who did not feel that the patient needed ICU admission, recommended starting diltiazem drip.  Patient continued to be hypertensive in the ED as diltiazem drip was not initiated for several hours.  Finally after  diltiazem drip was initiated, there was slight improvement in blood pressure with systolic coming down below 200.  Patient states he was told by his nephrologist a year ago that he had to be started on dialysis but he was hesitant at that time.  This past week he has had increasing shortness of breath and swelling of his legs despite taking Lasix.  He is making urine.  He spoke to his primary care physician and nephrologist and they both suggested that he come into the hospital to be started on dialysis.  States he had some left-sided chest pain when he initially came into the emergency room but it has now resolved.  States he is taking all of his home blood pressure medications and has not missed any doses.  No other complaints.  Review of Systems:  All systems reviewed and apart from history of presenting illness, are negative.  Past Medical History:  Diagnosis Date   Anxiety    Chronic kidney disease (CKD), stage V ([place]) 11/2020   CKD (chronic kidney disease), stage IV ([facility])    Depression    Dyslexia    Heart palpitations    High cholesterol    Hypertension    Hypertensive urgency    Polysubstance (including opioids) dependence, binge pattern ([place]) 08/2020   Stroke ([place]) 06/03/2017   "minor; completely recovered today" (06/13/2017)   Tobacco abuse     Past Surgical History:  Procedure Laterality Date   NO PAST SURGERIES       reports that he has quit smoking. His smoking use included cigarettes. He has a 30.00  pack-year smoking history. He has never used smokeless tobacco. He reports current drug use. Drug: Marijuana. He reports that he does not drink alcohol.  Allergies  Allergen Reactions   Lactose Intolerance (Gi) Nausea And Vomiting   Nitroglycerin Anxiety and Other (See Comments)    Pain and feels like he is on fire   Other Swelling    All beans   Isosorbide Itching    headaches    Family History  Problem Relation Age of Onset   Hypertension Mother    Hypertension  Brother    Diabetes type II Brother     Prior to Admission medications   Medication Sig Start Date End Date Taking? Authorizing Provider  acetaminophen (TYLENOL) 500 MG tablet Take 1,000 mg by mouth as needed for mild pain.   Yes [provider]  albuterol (PROAIR HFA) 108 (90 Base) MCG/ACT inhaler Inhale 1-2 puffs into the lungs every 6 (six) hours as needed for wheezing or shortness of breath. Patient taking differently: Inhale 1-2 puffs into the lungs as needed for wheezing or shortness of breath. 03/18/20  Yes [name], FNP  amLODipine (NORVASC) 10 MG tablet Take 1 tablet (10 mg total) by mouth daily. 03/17/20 09/24/21 Yes Jerline Pain, MD  aspirin EC 81 MG tablet Take 1 tablet (81 mg total) by mouth daily. 03/29/21  Yes Elsie Stain, MD  atorvastatin (LIPITOR) 40 MG tablet Take 1 tablet (40 mg total) by mouth daily. 03/29/21  Yes Elsie Stain, MD  calcium acetate (PHOSLO) 667 MG tablet Take 1 tablet (667 mg total) by mouth 3 (three) times daily with meals. 08/15/21  Yes Elsie Stain, MD  cloNIDine (CATAPRES) 0.1 MG tablet Take 1 tablet (0.1 mg total) by mouth 2 (two) times daily. 03/29/21  Yes Elsie Stain, MD  doxazosin (CARDURA) 8 MG tablet Take 2 tablets (16 mg total) by mouth daily. NEED OV. 04/08/21  Yes Jerline Pain, MD  folic acid (FOLVITE) 426 MCG tablet Take 0.5 tablets (400 mcg total) by mouth daily. 03/10/20  Yes [name], FNP  furosemide (LASIX) 80 MG tablet Take 2 tablets (160 mg total) by mouth 2 (two) times daily. 09/23/21  Yes Elsie Stain, MD  hydrALAZINE (APRESOLINE) 100 MG tablet TAKE 1 TABLET BY MOUTH THREE TIMES DAILY 08/25/21  Yes Jerline Pain, MD  labetalol (NORMODYNE) 200 MG tablet TAKE 3 TABLETS BY MOUTH THREE TIMES DAILY 08/25/21  Yes Jerline Pain, MD  Melatonin 10 MG CAPS Take 1 tablet by mouth daily.   Yes [provider]  Multiple Vitamin (MULTIVITAMIN WITH MINERALS) TABS tablet Take 1 tablet by mouth  daily.   Yes [provider]  sodium bicarbonate 650 MG tablet Take 2 tablets (1,300 mg total) by mouth 2 (two) times daily. 08/15/21 10/14/21 Yes Elsie Stain, MD  traZODone (DESYREL) 100 MG tablet Take 1 tablet (100 mg total) by mouth at bedtime. 08/02/21  Yes Elsie Stain, MD  ondansetron (ZOFRAN ODT) 4 MG disintegrating tablet Take 1 tablet (4 mg total) by mouth every 8 (eight) hours as needed for nausea or vomiting. 03/29/21   Elsie Stain, MD    Physical Exam: Vitals:   09/25/21 0315 09/25/21 0330 09/25/21 0345 09/25/21 0400  BP: (!) 196/108 (!) 202/107 (!) 208/108 (!) 202/99  Pulse: 65 67 68 62  Resp: 17 14 16  (!) 22  Temp:      TempSrc:      SpO2: 98% 99%  97% 98%  Weight:      Height:        Physical Exam Constitutional:      General: He is not in acute distress. HENT:     Head: Normocephalic and atraumatic.  Eyes:     Extraocular Movements: Extraocular movements intact.     Conjunctiva/sclera: Conjunctivae normal.  Neck:     Comments: JVD present Cardiovascular:     Rate and Rhythm: Normal rate and regular rhythm.     Pulses: Normal pulses.  Pulmonary:     Effort: Pulmonary effort is normal. No respiratory distress.     Breath sounds: Rales present. No wheezing.  Abdominal:     General: Bowel sounds are normal. There is no distension.     Palpations: Abdomen is soft.     Tenderness: There is no abdominal tenderness.  Musculoskeletal:     Cervical back: Normal range of motion and neck supple.     Right lower leg: Edema present.     Left lower leg: Edema present.     Comments: +3 pitting edema of bilateral lower extremities  Skin:    General: Skin is warm and dry.  Neurological:     General: No focal deficit present.     Mental Status: He is alert and oriented to person, place, and time.     Labs on Admission: I have personally reviewed following labs and imaging studies  CBC: Recent Labs  Lab 09/24/21 0109 09/24/21 1226  WBC 4.8  4.9  HGB 6.7* 6.3*  HCT 21.5* 20.6*  MCV 96.0 96.7  PLT 197 097   Basic Metabolic Panel: Recent Labs  Lab 09/24/21 0109 09/24/21 1226  NA 137 141  K 4.2 5.4*  CL 110 113*  CO2 14* 13*  GLUCOSE 91 83  BUN 92* 94*  CREATININE 10.85* 10.93*  CALCIUM 6.9* 7.2*  PHOS 6.5*  --    GFR: Estimated Creatinine Clearance: 8.9 mL/min (A) (by C-G formula based on SCr of 10.93 mg/dL (H)). Liver Function Tests: Recent Labs  Lab 09/24/21 0109  ALBUMIN 3.1*   No results for input(s): LIPASE, AMYLASE in the last 168 hours. No results for input(s): AMMONIA in the last 168 hours. Coagulation Profile: No results for input(s): INR, PROTIME in the last 168 hours. Cardiac Enzymes: No results for input(s): CKTOTAL, CKMB, CKMBINDEX, TROPONINI in the last 168 hours. BNP (last 3 results) No results for input(s): PROBNP in the last 8760 hours. HbA1C: No results for input(s): HGBA1C in the last 72 hours. CBG: No results for input(s): GLUCAP in the last 168 hours. Lipid Profile: No results for input(s): CHOL, HDL, LDLCALC, TRIG, CHOLHDL, LDLDIRECT in the last 72 hours. Thyroid Function Tests: No results for input(s): TSH, T4TOTAL, FREET4, T3FREE, THYROIDAB in the last 72 hours. Anemia Panel: Recent Labs    09/24/21 1915  FERRITIN 109  TIBC 284  IRON 56   Urine analysis:    Component Value Date/Time   COLORURINE STRAW (A) 11/09/2020 1221   APPEARANCEUR CLEAR 11/09/2020 1221   LABSPEC 1.006 11/09/2020 1221   PHURINE 6.0 11/09/2020 1221   GLUCOSEU NEGATIVE 11/09/2020 1221   HGBUR NEGATIVE 11/09/2020 1221   BILIRUBINUR NEGATIVE 11/09/2020 1221   BILIRUBINUR neg 07/10/2020 0955   KETONESUR NEGATIVE 11/09/2020 1221   PROTEINUR 100 (A) 11/09/2020 1221   UROBILINOGEN 0.2 07/10/2020 0955   UROBILINOGEN 0.2 11/24/2017 1509   NITRITE NEGATIVE 11/09/2020 1221   LEUKOCYTESUR NEGATIVE 11/09/2020 1221    Radiological Exams on Admission: DG  Chest 2 View  Result Date: 09/24/2021 CLINICAL  DATA:  Chest pain. EXAM: CHEST - 2 VIEW COMPARISON:  11/09/2020 FINDINGS: Two-view exam shows subtle alveolar opacity in the mid and lower lungs bilaterally without substantial pleural effusion. The cardio pericardial silhouette is enlarged. The visualized bony structures of the thorax show no acute abnormality. IMPRESSION: Subtle alveolar opacity in the mid and lower lungs bilaterally suggesting edema. Electronically Signed   By: Misty Stanley M.D.   On: 09/24/2021 12:58    EKG: Independently reviewed.  Sinus rhythm, new borderline T wave abnormality in lateral leads, QTC 524.  Assessment/Plan Principal Problem:   ESRD (end stage renal disease) ([place]) Active Problems:   Hypertensive emergency   Volume overload   Metabolic acidosis   Hyperkalemia   CKD stage V now progressed to ESRD Volume overload Metabolic acidosis Mild hyperkalemia BUN 94, creatinine 10.9, GFR 5.  Creatinine was 6.8 in February 2022 and 9.0 in June 2022.  BNP 2528.  Chest x-ray showing pulmonary edema.  He is not hypoxic but placed on 3 L supplemental oxygen in the ED for comfort.  Patient does not have dialysis access. Vascular surgery and nephrology consulted. Plan for initiation of dialysis after placement of left upper extremity AV graft and tunneled dialysis catheter in the morning.  -Keep n.p.o. Continue IV Lasix 120 mg twice daily per nephrology recommendations.  He was given Lokelma.  Continue bicarb supplementation and PhosLo.  Avoid nephrotoxic agents/contrast.  Hypertensive emergency Due to volume overload.  Blood pressure significantly elevated on arrival to the ED with systolic in the 768G and diastolic in the 881J.  Continue to be elevated with systolic in the 031R despite administration of multiple medications including hydralazine, clonidine, and labetalol.  ED physician discussed the case with PCCM MD who did not feel that the patient needed ICU admission, recommended starting diltiazem drip.  Patient  continued to be hypertensive in the ED as diltiazem drip was not initiated for several hours.  Finally after diltiazem drip was initiated, has been some improvement in blood pressure.  Systolic currently below 945. -Continue diltiazem drip.  Continue amlodipine, hydralazine, and clonidine.  He will be started on dialysis today after placement of HD access.  Chest pain Patient reports a brief episode of left-sided chest pain on arrival to the ED, now resolved.  ACS less likely as high-sensitivity troponin is only mildly elevated and stable. -Continue to monitor  Anemia Likely due to chronic renal disease.  Patient is not endorsing any symptoms of GI bleed.  Hemoglobin 6.3, was previously in the 8-10 range. -1 unit PRBCs administered in the ED.  Repeat labs to check H&H.  Transfuse PRBCs if hemoglobin less than 7.  Hypocalcemia Calcium 7.2 on BMP. -Check albumin level  QT prolongation -Cardiac monitoring.  Monitor potassium and magnesium levels.  Repeat EKG in a.m.  DVT prophylaxis: SCDs Code Status: Patient wishes to be full code. Family Communication: Diagnostic findings and treatment plan discussed with the patient.  No family available. Disposition Plan: Status is: Inpatient  Remains inpatient appropriate because: Needs serial hemodialysis  Level of care: Progressive  The medical decision making on this patient was of high complexity and the patient is at high risk for clinical deterioration, therefore this is a level 3 visit.  Shela Leff MD [facility]  If 7PM-7AM, please contact night-coverage [email or web address]  09/25/2021, 4:54 AM

## 2021-09-25 NOTE — Transfer of Care (Signed)
Immediate Anesthesia Transfer of Care Note  Patient: Stanley [name]  Procedure(s) Performed: INSERTION OF 19 sm PALIDROME PRECISION CHRONIC CATHETER (Right: Neck)  Patient Location: PACU  Anesthesia Type:General  Level of Consciousness: awake and oriented  Airway & Oxygen Therapy: Patient Spontanous Breathing and Patient connected to nasal cannula oxygen  Post-op Assessment: Report given to RN  Post vital signs: Reviewed and stable  Last Vitals:  Vitals Value Taken Time  BP 189/94   Temp    Pulse 79   Resp 18   SpO2 91     Last Pain:  Vitals:   09/25/21 0730  TempSrc: Oral  PainSc:          Complications: No notable events documented.

## 2021-09-25 NOTE — Progress Notes (Signed)
[facility] [place] PROGRESS NOTE  ASSESSMENT / PLAN: Stanley [name] is a 45 y.o. male with ESRD in need of dialysis access. Will plan on RIJ TDC and LUE AVG today in OR.    SUBJECTIVE: No complaints. Feeling a bit better.  OBJECTIVE: BP (!) 221/110    Pulse 62    Temp 98.8 F (37.1 C) (Oral)    Resp 17    Ht 5\' 6"  (1.676 m)    Wt 86.2 kg    SpO2 98%    BMI 30.67 kg/m   Intake/Output Summary (Last 24 hours) at 09/25/2021 0744 Last data filed at 09/25/2021 0039 Gross per 24 hour  Intake 366.25 ml  Output --  Net 366.25 ml    No acute distress RRR Unlabored breathing 2+ radial pulses  CBC Latest Ref Rng & Units 09/25/2021 09/24/2021 09/24/2021  WBC 4.0 - 10.5 K/uL 5.7 4.9 4.8  Hemoglobin 13.0 - 17.0 g/dL 7.2(L) 6.3(LL) 6.7(LL)  Hematocrit 39.0 - 52.0 % 22.7(L) 20.6(L) 21.5(L)  Platelets 150 - 400 K/uL 215 215 197     CMP Latest Ref Rng & Units 09/24/2021 09/24/2021 03/29/2021  Glucose 70 - 99 mg/dL 83 91 84  BUN 6 - 20 mg/dL 94(H) 92(H) 71(H)  Creatinine 0.61 - 1.24 mg/dL 10.93(H) 10.85(H) 9.02(H)  Sodium 135 - 145 mmol/L 141 137 141  Potassium 3.5 - 5.1 mmol/L 5.4(H) 4.2 5.0  Chloride 98 - 111 mmol/L 113(H) 110 105  CO2 22 - 32 mmol/L 13(L) 14(L) 16(L)  Calcium 8.9 - 10.3 mg/dL 7.2(L) 6.9(L) 8.7  Total Protein 6.0 - 8.5 g/dL - - 6.8  Total Bilirubin 0.0 - 1.2 mg/dL - - 0.4  Alkaline Phos 44 - 121 IU/L - - 103  AST 0 - 40 IU/L - - 7  ALT 0 - 44 IU/L - - 5    Estimated Creatinine Clearance: 8.9 mL/min (A) (by C-G formula based on SCr of 10.93 mg/dL (H)).  Yevonne Aline. Stanford Breed, MD [facility] Phone Number: [phone] 09/25/2021 7:44 AM

## 2021-09-26 ENCOUNTER — Encounter (HOSPITAL_COMMUNITY): Payer: Self-pay | Admitting: Vascular Surgery

## 2021-09-26 DIAGNOSIS — N186 End stage renal disease: Secondary | ICD-10-CM | POA: Diagnosis not present

## 2021-09-26 LAB — COMPREHENSIVE METABOLIC PANEL
ALT: 13 U/L (ref 0–44)
AST: 16 U/L (ref 15–41)
Albumin: 3 g/dL — ABNORMAL LOW (ref 3.5–5.0)
Alkaline Phosphatase: 76 U/L (ref 38–126)
Anion gap: 15 (ref 5–15)
BUN: 97 mg/dL — ABNORMAL HIGH (ref 6–20)
CO2: 16 mmol/L — ABNORMAL LOW (ref 22–32)
Calcium: 7.3 mg/dL — ABNORMAL LOW (ref 8.9–10.3)
Chloride: 109 mmol/L (ref 98–111)
Creatinine, Ser: 11.1 mg/dL — ABNORMAL HIGH (ref 0.61–1.24)
GFR, Estimated: 5 mL/min — ABNORMAL LOW (ref >60)
Glucose, Bld: 99 mg/dL (ref 70–99)
Potassium: 4.6 mmol/L (ref 3.5–5.1)
Sodium: 140 mmol/L (ref 135–145)
Total Bilirubin: 0.5 mg/dL (ref 0.3–1.2)
Total Protein: 5.7 g/dL — ABNORMAL LOW (ref 6.5–8.1)

## 2021-09-26 LAB — CBC
HCT: 24.5 % — ABNORMAL LOW (ref 39.0–52.0)
Hemoglobin: 7.6 g/dL — ABNORMAL LOW (ref 13.0–17.0)
MCH: 28.9 pg (ref 26.0–34.0)
MCHC: 31 g/dL (ref 30.0–36.0)
MCV: 93.2 fL (ref 80.0–100.0)
Platelets: 250 10*3/uL (ref 150–400)
RBC: 2.63 MIL/uL — ABNORMAL LOW (ref 4.22–5.81)
RDW: 16.7 % — ABNORMAL HIGH (ref 11.5–15.5)
WBC: 8.2 10*3/uL (ref 4.0–10.5)
nRBC: 0 % (ref 0.0–0.2)

## 2021-09-26 LAB — PHOSPHORUS: Phosphorus: 6.5 mg/dL — ABNORMAL HIGH (ref 2.5–4.6)

## 2021-09-26 LAB — MAGNESIUM: Magnesium: 2 mg/dL (ref 1.7–2.4)

## 2021-09-26 MED ORDER — MELATONIN 5 MG PO TABS
10.0000 mg | ORAL_TABLET | Freq: Every day | ORAL | Status: DC
  Administered 2021-09-26 – 2021-09-29 (×4): 10 mg via ORAL
  Filled 2021-09-26 (×4): qty 2

## 2021-09-26 MED ORDER — SODIUM CHLORIDE 0.9% FLUSH: 10.0000 mL | INTRAVENOUS | Status: DC | PRN

## 2021-09-26 MED ORDER — OXYCODONE HCL 5 MG PO TABS
10.0000 mg | ORAL_TABLET | Freq: Four times a day (QID) | ORAL | Status: DC | PRN
  Administered 2021-09-26 – 2021-09-29 (×4): 10 mg via ORAL
  Filled 2021-09-26 (×4): qty 2

## 2021-09-26 MED ORDER — TRAZODONE HCL 100 MG PO TABS
100.0000 mg | ORAL_TABLET | Freq: Every day | ORAL | Status: DC
  Administered 2021-09-26 – 2021-09-29 (×4): 100 mg via ORAL
  Filled 2021-09-26 (×4): qty 1

## 2021-09-26 MED ORDER — FUROSEMIDE 10 MG/ML IJ SOLN
160.0000 mg | Freq: Two times a day (BID) | INTRAVENOUS | Status: DC
  Administered 2021-09-26 – 2021-09-27 (×4): 160 mg via INTRAVENOUS
  Filled 2021-09-26 (×6): qty 16
  Filled 2021-09-26: qty 14

## 2021-09-26 MED ORDER — SODIUM CHLORIDE 0.9% FLUSH
10.0000 mL | Freq: Two times a day (BID) | INTRAVENOUS | Status: DC
  Administered 2021-09-26 – 2021-09-28 (×4): 10 mL
  Administered 2021-09-28: 09:00:00 3 mL
  Administered 2021-09-29: 21:00:00 10 mL

## 2021-09-26 NOTE — Progress Notes (Signed)
[facility] Progress Note    Assessment/ Plan:   CKD 5, now ESRD-has been needing to start HD for quite some time, patient has been resistant to this. Follows with Dr. Geralyn Flash. -s/p TDC placement 12/24, appreciate VVS. Hypoxia was a limiting factor for AVG--will be revisited prior to d/c -will start HD today-slow start protocol (tentative plan: HD 12/25, 12/26, 12/27) -will need CLIP for outpatient placement -Maintain MAP>65 for optimal renal perfusion.  -avoid further nephrotoxins including NSAIDS, Morphine.  Unless absolutely necessary, avoid CT with contrast and/or MRI with gadolinium.    SOB, pulm edema -lasix inc'd to 160mg  bid-continue until stable on HD   Hypertension -markedly hypertensive, chronic malignant HTN, resume home meds, will UF as tolerated. C/w lasix for now   Hyperkalemia -improved   Anemia due to chronic kidney disease: -Transfuse for Hgb<7 g/dL, hgb 6.3 on admit, s/p 1u prbc, hgb 7.6 this am -esa started   Metabolic acidosis -secondary to ESRD, started nahco3 for now--can be d/c'ed once on HD   Secondary hyperparathyroidism -resume phoslo    Subjective:   No acute events, reports some soreness with new TDC. Remains hypertensive. Did not receive hd yesterday (staffing issues), will be receiving HD later today    Objective:   BP (!) 199/105    Pulse 70    Temp 97.9 F (36.6 C) (Oral)    Resp 19    Ht 5\' 6"  (1.676 m)    Wt 97.1 kg    SpO2 99%    BMI 34.56 kg/m   Intake/Output Summary (Last 24 hours) at 09/26/2021 2505 Last data filed at 09/26/2021 0800 Gross per 24 hour  Intake 1039.97 ml  Output 1400 ml  Net -360.03 ml   Weight change: 10.9 kg  Physical Exam: Gen:nad CVS:s1s2 Resp: normal wob LZJ:QBHA Ext:2+ pitting edema bl le's Neuro: awake, alert Dialysis access: RIJ [facility]  Imaging: DG Chest 1 View  Result Date: 09/25/2021 CLINICAL DATA:  45 year old male central venous catheter. EXAM: CHEST  1 VIEW  COMPARISON:  09/24/2021 radiographs and earlier. FINDINGS: Portable AP upright view at 0923 hours. Right chest IJ approach dual lumen dialysis type catheter in place with tips at the lower SVC level below the carina. No pneumothorax. Stable cardiac size and mediastinal contours. Visualized tracheal air column is within normal limits. Worsening bibasilar ventilation with new obscuration of the left hemidiaphragm and patchy increased right lung base opacity. Stable pulmonary vascular congestion, possible mild interstitial edema. No obvious pleural effusion. Paucity of bowel gas in the upper abdomen. No acute osseous abnormality identified. IMPRESSION: 1. Right chest approach dual lumen dialysis type catheter placed with no adverse features. 2. Stable pulmonary vascular congestion with worsening bibasilar ventilation, including left lower lobe collapse or consolidation since yesterday. Electronically Signed   By: Genevie Ann M.D.   On: 09/25/2021 10:00   DG Chest 2 View  Result Date: 09/24/2021 CLINICAL DATA:  Chest pain. EXAM: CHEST - 2 VIEW COMPARISON:  11/09/2020 FINDINGS: Two-view exam shows subtle alveolar opacity in the mid and lower lungs bilaterally without substantial pleural effusion. The cardio pericardial silhouette is enlarged. The visualized bony structures of the thorax show no acute abnormality. IMPRESSION: Subtle alveolar opacity in the mid and lower lungs bilaterally suggesting edema. Electronically Signed   By: Misty Stanley M.D.   On: 09/24/2021 12:58   DG Chest Special View  Result Date: 09/25/2021 CLINICAL DATA:  Dialysis catheter placement EXAM: CHEST SPECIAL VIEW COMPARISON:  09/24/2021 FLUOROSCOPY TIME:  0  minutes 20 seconds Dose: 4.93 mGy Images: 1 FINDINGS: RIGHT jugular dual-lumen central venous catheter with tip projecting over SVC. Visualized osseous structures unremarkable. IMPRESSION: Tip of dual lumen RIGHT jugular central venous catheter tip projects over SVC. Electronically  Signed   By: Lavonia Dana M.D.   On: 09/25/2021 10:22   DG C-Arm 1-60 Min-No Report  Result Date: 09/25/2021 Fluoroscopy was utilized by the requesting physician.  No radiographic interpretation.    Labs: BMET Recent Labs  Lab 09/24/21 0109 09/24/21 1226 09/25/21 0608 09/26/21 0243  NA 137 141 142 140  K 4.2 5.4* 4.4 4.6  CL 110 113* 113* 109  CO2 14* 13* 15* 16*  GLUCOSE 91 83 81 99  BUN 92* 94* 94* 97*  CREATININE 10.85* 10.93* 10.93* 11.10*  CALCIUM 6.9* 7.2* 7.4* 7.3*  PHOS 6.5*  --  6.6* 6.5*   CBC Recent Labs  Lab 09/24/21 0109 09/24/21 1226 09/25/21 0608 09/26/21 0243  WBC 4.8 4.9 5.7 8.2  HGB 6.7* 6.3* 7.2* 7.6*  HCT 21.5* 20.6* 22.7* 24.5*  MCV 96.0 96.7 94.2 93.2  PLT 197 215 215 250    Medications:     amLODipine  10 mg Oral Daily   atorvastatin  40 mg Oral Daily   calcium acetate  667 mg Oral TID WC   Chlorhexidine Gluconate Cloth  6 each Topical Q0600   cloNIDine  0.2 mg Oral TID   [START ON 10/02/2021] darbepoetin (ARANESP) injection - DIALYSIS  60 mcg Intravenous Q Sat-HD   hydrALAZINE  100 mg Oral TID   sodium bicarbonate  1,300 mg Oral TID      Gean Quint, MD [facility] 09/26/2021, 9:53 AM

## 2021-09-26 NOTE — Progress Notes (Signed)
[facility] [place] PROGRESS NOTE  ASSESSMENT / PLAN: Stanley [name] is a 45 y.o. male with new diagnosis of ESRD. TDC placed yesterday. Hypoxemia prevented safe placement of AVG. Plan AVG prior to discharge.    SUBJECTIVE: Sore about the catheter.  OBJECTIVE: BP (!) 206/114 (BP Location: Right Arm)    Pulse 77    Temp 98 F (36.7 C) (Oral)    Resp 19    Ht 5\' 6"  (1.676 m)    Wt 97.1 kg    SpO2 93%    BMI 34.56 kg/m   Intake/Output Summary (Last 24 hours) at 09/26/2021 0659 Last data filed at 09/26/2021 0211 Gross per 24 hour  Intake 1059.97 ml  Output 1410 ml  Net -350.03 ml    No distress. TDC in place.  Unlabored respirations. Neck soft  CBC Latest Ref Rng & Units 09/26/2021 09/25/2021 09/24/2021  WBC 4.0 - 10.5 K/uL 8.2 5.7 4.9  Hemoglobin 13.0 - 17.0 g/dL 7.6(L) 7.2(L) 6.3(LL)  Hematocrit 39.0 - 52.0 % 24.5(L) 22.7(L) 20.6(L)  Platelets 150 - 400 K/uL 250 215 215     CMP Latest Ref Rng & Units 09/26/2021 09/25/2021 09/24/2021  Glucose 70 - 99 mg/dL 99 81 83  BUN 6 - 20 mg/dL 97(H) 94(H) 94(H)  Creatinine 0.61 - 1.24 mg/dL 11.10(H) 10.93(H) 10.93(H)  Sodium 135 - 145 mmol/L 140 142 141  Potassium 3.5 - 5.1 mmol/L 4.6 4.4 5.4(H)  Chloride 98 - 111 mmol/L 109 113(H) 113(H)  CO2 22 - 32 mmol/L 16(L) 15(L) 13(L)  Calcium 8.9 - 10.3 mg/dL 7.3(L) 7.4(L) 7.2(L)  Total Protein 6.5 - 8.1 g/dL 5.7(L) - -  Total Bilirubin 0.3 - 1.2 mg/dL 0.5 - -  Alkaline Phos 38 - 126 U/L 76 - -  AST 15 - 41 U/L 16 - -  ALT 0 - 44 U/L 13 - -    Estimated Creatinine Clearance: 9.3 mL/min (A) (by C-G formula based on SCr of 11.1 mg/dL (H)).  Yevonne Aline. Stanford Breed, MD [facility] Phone Number: [phone] 09/26/2021 6:59 AM

## 2021-09-26 NOTE — Progress Notes (Signed)
PROGRESS NOTE    Stanley [name]  XKG:[id number] DOB: Mar 29, 1976 DOA: 09/24/2021 PCP: Stanley Stain, MD   Brief Narrative:  Stanley [name] is a 45 y.o. male with medical history significant of chronic malignant hypertension, medical noncompliance, CKD stage V, OSA, chronic diastolic CHF.  He has had deterioration of his renal function and sent to the ED by his PCP/nephrology to be started on dialysis.  Patient complained of shortness of breath, chest pain, and bilateral lower extremity edema.    In the ED, not febrile or tachycardic.  Not hypoxic.  Blood pressure significantly elevated with systolic in the 702O and diastolic in the 378H.  Labs showing no leukocytosis.  Hemoglobin 6.3, was previously in the 8-10 range.  Potassium 5.4.  Bicarb 13.  BUN 94, creatinine 10.9, GFR 5.  Creatinine was 6.8 in February 2022 and 9.0 in June 2022.  Calcium 7.2.  High-sensitivity troponin 34 >35.  BNP 2528.  COVID and influenza PCR negative.  Chest x-ray showing pulmonary edema.  Patient does not have dialysis access.  Nephrology and vascular surgery consulted.  Plan for initiation of dialysis after placement of left upper extremity AV graft and tunneled dialysis catheter in the morning.  Nephrology recommended starting IV Lasix 120 mg twice daily until stable on HD.  He was given [facility] for hyperkalemia and also expected to improve with Lasix.  1 unit PRBCs given for anemia.  Started on bicarb supplementation for metabolic acidosis.  Blood pressure continued to be significantly elevated with systolic in the 885O despite administration of multiple medications including hydralazine, clonidine, and labetalol.  ED physician discussed the case with PCCM MD who did not feel that the patient needed ICU admission, recommended starting diltiazem drip  Assessment & Plan:  CKD stage V now progressed to ESRD Volume overload Metabolic acidosis Mild hyperkalemia -Upon arrival: BUN 94, creatinine 10.9, GFR 5.  Creatinine was  6.8 in February 2022 and 9.0 in June 2022.  BNP 2528.   -Chest x-ray showing pulmonary edema.   -S/p [facility] placement on 12/24.  Hypoxemia prevented safe placement of AVT.  Plan is ABG prior to discharge-Appreciate vascular surgery help. -Appreciate nephrology help.  Plan for initiation of dialysis today -Lasix increased to 160 mg twice daily until stable on hemodialysis.   -He was given Lokelma.  Continue bicarb supplementation and PhosLo.  Avoid nephrotoxic agents/contrast. -Appreciate nephrology's help -Monitor vitals closely -Strict INO's and daily weight.  Acute hypoxemic respiratory failure in the setting of fluid overload/pulmonary edema: -Patient is requiring 2-3 L of oxygen via nasal cannula.  Chest x-ray shows pulmonary edema. -Continue Lasix as above -On continuous pulse ox.  We will try to wean off of oxygen as tolerated.   Hypertensive emergency Malignant hypertension -Due to volume overload.   -Blood pressure significantly elevated on arrival to the ED with systolic in the 277A and diastolic in the 128N.   -Continue diltiazem drip.  Continue amlodipine, hydralazine, Lasix, and clonidine.   -Hopefully his blood pressure stabilizes after hemodialysis.    Chest pain -Likely secondary to fluid overload/anxiety -Patient reports a brief episode of left-sided chest pain on arrival to the ED, now resolved.  ACS less likely as high-sensitivity troponin is only mildly elevated and stable. -Continue to monitor   Anemia -Likely due to chronic renal disease.  Patient is not endorsing any symptoms of GI bleed.  Hemoglobin 6.3, was previously in the 8-10 range. -1 unit PRBCs administered in the ED. hemoglobin is 7.6 this morning.  Monitor H&H closely   QT prolongation -Cardiac monitoring.  Monitor potassium and magnesium levels.   -Avoid QT prolonging medication  DVT prophylaxis: SCD/heparin  code Status: Full code Family Communication:  None present at bedside.  Plan of care  discussed with patient in length and he verbalized understanding and agreed with it. Disposition Plan: To be determined  Consultants:  Nephrology Vascular surgery  Procedures:  Dialysis catheter placement on 09/25/2021  Antimicrobials:  None  Status is: Inpatient     Subjective: Patient seen and examined.  Sitting comfortably on the bed.  On nasal cannula.  Feels little better today however continues to have pain around [facility] catheter site.  Has been taking Tylenol with little to no help.  Did not receive hemodialysis yesterday.  He continues to have elevated blood pressure, shortness of breath and pedal swelling.  Objective: Vitals:   09/25/21 1443 09/25/21 1608 09/25/21 2140 09/26/21 0519  BP: (!) 209/102 (!) 203/101 (!) 206/106 (!) 206/114  Pulse:  77    Resp:  20 19 19   Temp:  98.7 F (37.1 C) 98.1 F (36.7 C) 98 F (36.7 C)  TempSrc:  Oral Oral Oral  SpO2:  93%    Weight:    97.1 kg  Height:        Intake/Output Summary (Last 24 hours) at 09/26/2021 0749 Last data filed at 09/26/2021 0211 Gross per 24 hour  Intake 1059.97 ml  Output 1410 ml  Net -350.03 ml    Filed Weights   09/24/21 1513 09/26/21 0519  Weight: 86.2 kg 97.1 kg    Examination:  General exam: Appears calm and comfortable, appears weak, sick, pale on nasal cannula Respiratory system: Coarse bilateral Rales noted  cardiovascular system: S1 & S2 heard, RRR. No JVD, murmurs, rubs, gallops or clicks.  Bilateral 2+ pitting edema positive  gastrointestinal system: Abdomen is nondistended, soft and nontender. No organomegaly or masses felt. Normal bowel sounds heard. Central nervous system: Alert and oriented. No focal neurological deficits. Extremities: Symmetric 5 x 5 power.  Dialysis catheter on right upper chest.  Dressing dry and intact. Skin: No rashes, lesions or ulcers Psychiatry: Judgement and insight appear normal. Mood & affect appropriate.    Data Reviewed: I have personally reviewed  following labs and imaging studies  CBC: Recent Labs  Lab 09/24/21 0109 09/24/21 1226 09/25/21 0608 09/26/21 0243  WBC 4.8 4.9 5.7 8.2  HGB 6.7* 6.3* 7.2* 7.6*  HCT 21.5* 20.6* 22.7* 24.5*  MCV 96.0 96.7 94.2 93.2  PLT 197 215 215 202    Basic Metabolic Panel: Recent Labs  Lab 09/24/21 0109 09/24/21 1226 09/25/21 0608 09/26/21 0243  NA 137 141 142 140  K 4.2 5.4* 4.4 4.6  CL 110 113* 113* 109  CO2 14* 13* 15* 16*  GLUCOSE 91 83 81 99  BUN 92* 94* 94* 97*  CREATININE 10.85* 10.93* 10.93* 11.10*  CALCIUM 6.9* 7.2* 7.4* 7.3*  MG  --   --  1.9 2.0  PHOS 6.5*  --  6.6* 6.5*    GFR: Estimated Creatinine Clearance: 9.3 mL/min (A) (by C-G formula based on SCr of 11.1 mg/dL (H)). Liver Function Tests: Recent Labs  Lab 09/24/21 0109 09/25/21 0608 09/26/21 0243  AST  --   --  16  ALT  --   --  13  ALKPHOS  --   --  76  BILITOT  --   --  0.5  PROT  --   --  5.7*  ALBUMIN  3.1* 3.3*   3.2* 3.0*    No results for input(s): LIPASE, AMYLASE in the last 168 hours. No results for input(s): AMMONIA in the last 168 hours. Coagulation Profile: No results for input(s): INR, PROTIME in the last 168 hours. Cardiac Enzymes: No results for input(s): CKTOTAL, CKMB, CKMBINDEX, TROPONINI in the last 168 hours. BNP (last 3 results) No results for input(s): PROBNP in the last 8760 hours. HbA1C: No results for input(s): HGBA1C in the last 72 hours. CBG: Recent Labs  Lab 09/25/21 0539  GLUCAP 78    Lipid Profile: No results for input(s): CHOL, HDL, LDLCALC, TRIG, CHOLHDL, LDLDIRECT in the last 72 hours. Thyroid Function Tests: No results for input(s): TSH, T4TOTAL, FREET4, T3FREE, THYROIDAB in the last 72 hours. Anemia Panel: Recent Labs    09/24/21 1915  FERRITIN 109  TIBC 284  IRON 56    Sepsis Labs: No results for input(s): PROCALCITON, LATICACIDVEN in the last 168 hours.  Recent Results (from the past 240 hour(s))  Resp Panel by RT-PCR (Flu A&B, Covid)  Nasopharyngeal Swab     Status: None   Collection Time: 09/24/21  3:46 PM   Specimen: Nasopharyngeal Swab; Nasopharyngeal(NP) swabs in vial transport medium  Result Value Ref Range Status   SARS Coronavirus 2 by RT PCR NEGATIVE NEGATIVE Final    Comment: (NOTE) SARS-CoV-2 target nucleic acids are NOT DETECTED.  The SARS-CoV-2 RNA is generally detectable in upper respiratory specimens during the acute phase of infection. The lowest concentration of SARS-CoV-2 viral copies this assay can detect is 138 copies/mL. A negative result does not preclude SARS-Cov-2 infection and should not be used as the sole basis for treatment or other patient management decisions. A negative result may occur with  improper specimen collection/handling, submission of specimen other than nasopharyngeal swab, presence of viral mutation(s) within the areas targeted by this assay, and inadequate number of viral copies(<138 copies/mL). A negative result must be combined with clinical observations, patient history, and epidemiological information. The expected result is Negative.  Fact Sheet for Patients:  EntrepreneurPulse.com.au  Fact Sheet for Healthcare Providers:  IncredibleEmployment.be  This test is no t yet approved or cleared by the [place] FDA and  has been authorized for detection and/or diagnosis of SARS-CoV-2 by FDA under an Emergency Use Authorization (EUA). This EUA will remain  in effect (meaning this test can be used) for the duration of the COVID-19 declaration under Section 564(b)(1) of the Act, 21 U.S.C.section 360bbb-3(b)(1), unless the authorization is terminated  or revoked sooner.       Influenza A by PCR NEGATIVE NEGATIVE Final   Influenza B by PCR NEGATIVE NEGATIVE Final    Comment: (NOTE) The Xpert Xpress SARS-CoV-2/FLU/RSV plus assay is intended as an aid in the diagnosis of influenza from Nasopharyngeal swab specimens and should not be  used as a sole basis for treatment. Nasal washings and aspirates are unacceptable for Xpert Xpress SARS-CoV-2/FLU/RSV testing.  Fact Sheet for Patients: EntrepreneurPulse.com.au  Fact Sheet for Healthcare Providers: IncredibleEmployment.be  This test is not yet approved or cleared by the [place] FDA and has been authorized for detection and/or diagnosis of SARS-CoV-2 by FDA under an Emergency Use Authorization (EUA). This EUA will remain in effect (meaning this test can be used) for the duration of the COVID-19 declaration under Section 564(b)(1) of the Act, 21 U.S.C. section 360bbb-3(b)(1), unless the authorization is terminated or revoked.  Performed at [facility], [place] [address] [place], [place] [address]  Radiology Studies: DG Chest 1 View  Result Date: 09/25/2021 CLINICAL DATA:  44 year old male central venous catheter. EXAM: CHEST  1 VIEW COMPARISON:  09/24/2021 radiographs and earlier. FINDINGS: Portable AP upright view at 0923 hours. Right chest IJ approach dual lumen dialysis type catheter in place with tips at the lower SVC level below the carina. No pneumothorax. Stable cardiac size and mediastinal contours. Visualized tracheal air column is within normal limits. Worsening bibasilar ventilation with new obscuration of the left hemidiaphragm and patchy increased right lung base opacity. Stable pulmonary vascular congestion, possible mild interstitial edema. No obvious pleural effusion. Paucity of bowel gas in the upper abdomen. No acute osseous abnormality identified. IMPRESSION: 1. Right chest approach dual lumen dialysis type catheter placed with no adverse features. 2. Stable pulmonary vascular congestion with worsening bibasilar ventilation, including left lower lobe collapse or consolidation since yesterday. Electronically Signed   By: Genevie Ann M.D.   On: 09/25/2021 10:00   DG Chest 2 View  Result Date:  09/24/2021 CLINICAL DATA:  Chest pain. EXAM: CHEST - 2 VIEW COMPARISON:  11/09/2020 FINDINGS: Two-view exam shows subtle alveolar opacity in the mid and lower lungs bilaterally without substantial pleural effusion. The cardio pericardial silhouette is enlarged. The visualized bony structures of the thorax show no acute abnormality. IMPRESSION: Subtle alveolar opacity in the mid and lower lungs bilaterally suggesting edema. Electronically Signed   By: Misty Stanley M.D.   On: 09/24/2021 12:58   DG Chest Special View  Result Date: 09/25/2021 CLINICAL DATA:  Dialysis catheter placement EXAM: CHEST SPECIAL VIEW COMPARISON:  09/24/2021 FLUOROSCOPY TIME:  0 minutes 20 seconds Dose: 4.93 mGy Images: 1 FINDINGS: RIGHT jugular dual-lumen central venous catheter with tip projecting over SVC. Visualized osseous structures unremarkable. IMPRESSION: Tip of dual lumen RIGHT jugular central venous catheter tip projects over SVC. Electronically Signed   By: Lavonia Dana M.D.   On: 09/25/2021 10:22   DG C-Arm 1-60 Min-No Report  Result Date: 09/25/2021 Fluoroscopy was utilized by the requesting physician.  No radiographic interpretation.    Scheduled Meds:  amLODipine  10 mg Oral Daily   atorvastatin  40 mg Oral Daily   calcium acetate  667 mg Oral TID WC   Chlorhexidine Gluconate Cloth  6 each Topical Q0600   cloNIDine  0.2 mg Oral TID   [START ON 10/02/2021] darbepoetin (ARANESP) injection - DIALYSIS  60 mcg Intravenous Q Sat-HD   hydrALAZINE  100 mg Oral TID   sodium bicarbonate  1,300 mg Oral TID   Continuous Infusions:  sodium chloride     sodium chloride     sodium chloride Stopped (09/25/21 0050)   diltiazem (CARDIZEM) infusion 15 mg/hr (09/25/21 0241)   furosemide Stopped (09/25/21 1937)     LOS: 1 day   Time spent: 35 minutes   Knight Oelkers Loann Quill, MD [facility]  If 7PM-7AM, please contact night-coverage [email or web address] 09/26/2021, 7:49 AM

## 2021-09-27 DIAGNOSIS — N186 End stage renal disease: Secondary | ICD-10-CM | POA: Diagnosis not present

## 2021-09-27 LAB — BASIC METABOLIC PANEL
Anion gap: 10 (ref 5–15)
BUN: 54 mg/dL — ABNORMAL HIGH (ref 6–20)
CO2: 24 mmol/L (ref 22–32)
Calcium: 7.3 mg/dL — ABNORMAL LOW (ref 8.9–10.3)
Chloride: 104 mmol/L (ref 98–111)
Creatinine, Ser: 7.08 mg/dL — ABNORMAL HIGH (ref 0.61–1.24)
GFR, Estimated: 9 mL/min — ABNORMAL LOW (ref >60)
Glucose, Bld: 91 mg/dL (ref 70–99)
Potassium: 3.4 mmol/L — ABNORMAL LOW (ref 3.5–5.1)
Sodium: 138 mmol/L (ref 135–145)

## 2021-09-27 LAB — CBC
HCT: 23.1 % — ABNORMAL LOW (ref 39.0–52.0)
Hemoglobin: 7.6 g/dL — ABNORMAL LOW (ref 13.0–17.0)
MCH: 29.8 pg (ref 26.0–34.0)
MCHC: 32.9 g/dL (ref 30.0–36.0)
MCV: 90.6 fL (ref 80.0–100.0)
Platelets: 233 10*3/uL (ref 150–400)
RBC: 2.55 MIL/uL — ABNORMAL LOW (ref 4.22–5.81)
RDW: 16.4 % — ABNORMAL HIGH (ref 11.5–15.5)
WBC: 5.7 10*3/uL (ref 4.0–10.5)
nRBC: 0 % (ref 0.0–0.2)

## 2021-09-27 LAB — MAGNESIUM: Magnesium: 1.6 mg/dL — ABNORMAL LOW (ref 1.7–2.4)

## 2021-09-27 LAB — HEPATITIS B SURFACE ANTIBODY, QUANTITATIVE: Hep B S AB Quant (Post): <3.1 m[IU]/mL — ABNORMAL LOW (ref >9.9)

## 2021-09-27 MED ORDER — SODIUM CHLORIDE 0.9 % IV SOLN
125.0000 mg | INTRAVENOUS | Status: DC
  Administered 2021-09-28 – 2021-09-30 (×2): 125 mg via INTRAVENOUS
  Filled 2021-09-27 (×3): qty 10

## 2021-09-27 NOTE — TOC Progression Note (Signed)
Transition of Care [facility]) - Progression Note    Patient Details  Name: Stanley [name] MRN: [id number] Date of Birth: January 03, 1976  Transition of Care [facility]) CM/SW Contact  Zenon Mayo, RN Phone Number: 09/27/2021, 4:17 PM  Clinical Narrative:     Transition of Care (TOC) Screening Note   Patient Details  Name: Stanley [name] Date of Birth: [birth year]   Transition of Care [facility]) CM/SW Contact:    Zenon Mayo, RN Phone Number: 09/27/2021, 4:17 PM    Transition of Care Department [facility]) has reviewed patient and no TOC needs have been identified at this time. We will continue to monitor patient advancement through interdisciplinary progression rounds. If new patient transition needs arise, please place a TOC consult.          Expected Discharge Plan and Services                                                 Social Determinants of Health (SDOH) Interventions    Readmission Risk Interventions No flowsheet data found.

## 2021-09-27 NOTE — Progress Notes (Signed)
Out Patient Arrangements:  Have been requested to arrange out pt HD for pt. Have submitted medical records to [facility] admissions. Please advise of d/c date.   Linus Orn [facility] [phone]

## 2021-09-27 NOTE — Progress Notes (Addendum)
[facility] Progress Note   Subjective:    Seen and examined on HD today. Denies SOB, CP, and N/V. Noted SBPs in 200's and LE edema. Raised UFG 3L. Tolerated HD with net UF 2.5L.   Objective Vitals:   09/27/21 1100 09/27/21 1114 09/27/21 1154 09/27/21 1212  BP: (!) 202/114 (!) 202/110 (!) 216/112 (!) 220/111  Pulse: 63 61 63 64  Resp: 12 13 12    Temp:  97.7 F (36.5 C) (!) 97.4 F (36.3 C)   TempSrc:  Temporal Oral   SpO2:  100% 100% 98%  Weight:  93.7 kg    Height:       Physical Exam General: Middle-aged male; NAD Heart: S1 and S2; No murmurs, rales, or rhonchi Lungs: Noted crackles mid bilateral lobes; no wheezing or rhonchi Abdomen: Soft, non-tender, active bowel sounds Extremities: 1+ edema LE BLLE Dialysis Access: TDC placed 09/25/21 by Dr. Wynonia Sours Weights   09/26/21 0519 09/27/21 0830 09/27/21 1114  Weight: 97.1 kg 96.2 kg 93.7 kg    Intake/Output Summary (Last 24 hours) at 09/27/2021 1328 Last data filed at 09/27/2021 1222 Gross per 24 hour  Intake 1472 ml  Output 5900 ml  Net -4428 ml    Additional Objective Labs: Basic Metabolic Panel: Recent Labs  Lab 09/24/21 0109 09/24/21 1226 09/25/21 0608 09/26/21 0243  NA 137 141 142 140  K 4.2 5.4* 4.4 4.6  CL 110 113* 113* 109  CO2 14* 13* 15* 16*  GLUCOSE 91 83 81 99  BUN 92* 94* 94* 97*  CREATININE 10.85* 10.93* 10.93* 11.10*  CALCIUM 6.9* 7.2* 7.4* 7.3*  PHOS 6.5*  --  6.6* 6.5*   Liver Function Tests: Recent Labs  Lab 09/24/21 0109 09/25/21 0608 09/26/21 0243  AST  --   --  16  ALT  --   --  13  ALKPHOS  --   --  76  BILITOT  --   --  0.5  PROT  --   --  5.7*  ALBUMIN 3.1* 3.3*   3.2* 3.0*   No results for input(s): LIPASE, AMYLASE in the last 168 hours. CBC: Recent Labs  Lab 09/24/21 0109 09/24/21 1226 09/25/21 0608 09/26/21 0243  WBC 4.8 4.9 5.7 8.2  HGB 6.7* 6.3* 7.2* 7.6*  HCT 21.5* 20.6* 22.7* 24.5*  MCV 96.0 96.7 94.2 93.2  PLT 197 215 215 250    Blood Culture    Component Value Date/Time   SDES URINE, CLEAN CATCH 02/10/2018 1652   SPECREQUEST NONE 02/10/2018 1652   CULT  02/10/2018 1652    NO GROWTH Performed at [facility], [place] [address] [place], [place] [address]    REPTSTATUS 02/11/2018 FINAL 02/10/2018 1652    Cardiac Enzymes: No results for input(s): CKTOTAL, CKMB, CKMBINDEX, TROPONINI in the last 168 hours. CBG: Recent Labs  Lab 09/25/21 0539  GLUCAP 78   Iron Studies:  Recent Labs    09/24/21 1915  IRON 56  TIBC 284  FERRITIN 109   Lab Results  Component Value Date   INR 1.1 08/23/2019   Studies/Results: No results found.  Medications:  sodium chloride     sodium chloride     sodium chloride Stopped (09/25/21 0050)   diltiazem (CARDIZEM) infusion 15 mg/hr (09/25/21 0241)   furosemide 160 mg (09/27/21 1225)    amLODipine  10 mg Oral Daily   atorvastatin  40 mg Oral Daily   calcium acetate  667 mg Oral TID WC  Chlorhexidine Gluconate Cloth  6 each Topical Q0600   cloNIDine  0.2 mg Oral TID   [START ON 10/02/2021] darbepoetin (ARANESP) injection - DIALYSIS  60 mcg Intravenous Q Sat-HD   hydrALAZINE  100 mg Oral TID   melatonin  10 mg Oral QHS   sodium bicarbonate  1,300 mg Oral TID   sodium chloride flush  10-40 mL Intracatheter Q12H   traZODone  100 mg Oral QHS    Dialysis Orders: CLIP for outpatient placement  Assessment/Plan: Acute Hypoxic Respiratory Failure-in setting of volume overload. Please see #2 2.  ESRD -pt new to HD. First session 09/26/21. Received 2nd treatment today. Raised UFG 3L. Tolerated net UF 2.5L. Plan for 3rd HD tomorrow 12/27-slow start protocol. Reached out to on-call renal navigator to ensure CLIP for outpatient HD placement has been initiated. 3. Vascular Access-s/p [facility] placment 12/24 by Dr. Stanford Breed. Unable to place AVG earlier d/t hypoxemia. Pt now on [place] with O2 sats 97-100%. Reviewed last VVS note-plan for AVG placement prior to discharge. 3. Anemia  of CKD- Hgb 7.6, s/p 1 unit PRBCs 12/24. Iron studies from 12/23: Iron 56, Tsat 20%, and Ferritin 109-will order Fe series X 10. Continue Aranesp. Blood transfusion PRN for Hgb < 7.0 4. Secondary hyperparathyroidism - CorrCa 8.1-more likely will start Hectorol with HD, will order PTH in AM, continue binders 5. Metabolic Acidosis- Pt now on HD so will d/c PO sodium bicarb 5. HTN/volume - SBPs very high. New to HD-pushing UF. Continue Amlodipine, Clonidine, Hydralazine, and IV Lasix. 6. Nutrition - Continue renal diet on fluid restriction  Stanley Poet, NP [facility] 09/27/2021,1:28 PM  LOS: 2 days

## 2021-09-27 NOTE — Progress Notes (Signed)
PROGRESS NOTE    Stanley [name]  BTD:[id number] DOB: 1976/09/27 DOA: 09/24/2021 PCP: Elsie Stain, MD   Brief Narrative:  Stanley [name] is a 45 y.o. male with medical history significant of chronic malignant hypertension, medical noncompliance, CKD stage V, OSA, chronic diastolic CHF.  He has had deterioration of his renal function and sent to the ED by his PCP/nephrology to be started on dialysis.  Patient complained of shortness of breath, chest pain, and bilateral lower extremity edema.    In the ED, not febrile or tachycardic.  Not hypoxic.  Blood pressure significantly elevated with systolic in the 106Y and diastolic in the 694W.  Labs showing no leukocytosis.  Hemoglobin 6.3, was previously in the 8-10 range.  Potassium 5.4.  Bicarb 13.  BUN 94, creatinine 10.9, GFR 5.  Creatinine was 6.8 in February 2022 and 9.0 in June 2022.  Calcium 7.2.  High-sensitivity troponin 34 >35.  BNP 2528.  COVID and influenza PCR negative.  Chest x-ray showing pulmonary edema.  Patient does not have dialysis access.  Nephrology and vascular surgery consulted.  Plan for initiation of dialysis after placement of left upper extremity AV graft and tunneled dialysis catheter.  Nephrology recommended starting IV Lasix 120 mg twice daily until stable on HD.  He was given [facility] for hyperkalemia and also expected to improve with Lasix.  1 unit PRBCs given for anemia.  Started on bicarb supplementation for metabolic acidosis.  Blood pressure continued to be significantly elevated with systolic in the 546E despite administration of multiple medications including hydralazine, clonidine, and labetalol.  ED physician discussed the case with PCCM MD who did not feel that the patient needed ICU admission  Assessment & Plan:  CKD stage V now progressed to ESRD Volume overload -Upon arrival: BUN 94, creatinine 10.9, GFR 5.  Creatinine was 6.8 in February 2022 and 9.0 in June 2022.  BNP 2528.   -Chest x-ray showing pulmonary  edema.   -S/p [facility] placement on 12/24.  Hypoxemia prevented safe placement of AVT.  Plan is AVG placement prior to discharge-Appreciate vascular surgery help. -Started on hemodialysis on 09/26/2021 -Continue Lasix 160 mg twice daily -Appreciate nephrology's help -Monitor vitals closely -Strict INO's and daily weight.  Acute hypoxemic respiratory failure in the setting of fluid overload/pulmonary edema: -On admission patient requiring 2-3 L of oxygen via nasal cannula.  Chest x-ray shows pulmonary edema. -Continue Lasix as above -Improving.  Currently on room air.  Hypertensive emergency Malignant hypertension -Due to volume overload.   -Blood pressure Significantly elevated on arrival to the ED with systolic in the 703J and diastolic in the 009F.   -Continue diltiazem drip.  Continue amlodipine, hydralazine, Lasix, and clonidine.   -Hopefully his blood pressure stabilizes with hemodialysis.    Chest pain -Likely secondary to fluid overload/anxiety -Patient reports a brief episode of left-sided chest pain on arrival to the ED, now resolved.  ACS less likely as high-sensitivity troponin is only mildly elevated and stable. -Continue to monitor  Hyperkalemia: Status post Lokelma.  Resolved.   Anemia -Likely due to chronic renal disease.  Patient is not endorsing any symptoms of GI bleed.  Hemoglobin 6.3, was previously in the 8-10 range. -1 unit PRBCs administered in the ED. hemoglobin is 7.6.  Monitor H&H closely.  Labs pending this morning.  Secondary hyperparathyroidism: Continue PhosLo  Metabolic acidosis secondary to ESRD: Continue sodium bicarb   QT prolongation -Cardiac monitoring.  Monitor potassium and magnesium levels.   -Avoid QT prolonging  medication  DVT prophylaxis: SCD/heparin  code Status: Full code Family Communication:  None present at bedside.  Plan of care discussed with patient in length and he verbalized understanding and agreed with it. Disposition Plan: To  be determined  Consultants:  Nephrology Vascular surgery  Procedures:  Dialysis catheter placement on 09/25/2021  Antimicrobials:  None  Status is: Inpatient     Subjective: Patient seen and examined in dialysis.  Tells me that he feels better this morning.  He had dialysis yesterday and this morning.  His breathing, leg swelling has improved and denies pain around catheter site.  Denies chest pain, palpitation, lightheadedness, dizziness, orthopnea or PND.  No acute events overnight.  Objective: Vitals:   09/27/21 0841 09/27/21 0900 09/27/21 0930 09/27/21 1000  BP: (!) 216/116 (!) 208/116 (!) 207/111 (!) 226/115  Pulse: 60 60 60 61  Resp: 13 14 13 13   Temp:      TempSrc:      SpO2:      Weight:      Height:        Intake/Output Summary (Last 24 hours) at 09/27/2021 1035 Last data filed at 09/27/2021 0437 Gross per 24 hour  Intake 1752 ml  Output 3800 ml  Net -2048 ml    Filed Weights   09/24/21 1513 09/26/21 0519 09/27/21 0830  Weight: 86.2 kg 97.1 kg 96.2 kg    Examination:  General exam: Appears calm and comfortable, on nasal cannula, communicating well no leg edema  respiratory system: Clear to auscultation.  No wheezing or rhonchi noted  cardiovascular system: S1 & S2 heard, RRR. No JVD, murmurs, rubs, gallops or clicks.  Bilateral 2+ pitting edema positive  gastrointestinal system: Abdomen is nondistended, soft and nontender. No organomegaly or masses felt. Normal bowel sounds heard. Central nervous system: Alert and oriented. No focal neurological deficits. Extremities: Symmetric 5 x 5 power.  Dialysis catheter on right upper chest.  Dressing dry and intact. Skin: No rashes, lesions or ulcers Psychiatry: Judgement and insight appear normal. Mood & affect appropriate.    Data Reviewed: I have personally reviewed following labs and imaging studies  CBC: Recent Labs  Lab 09/24/21 0109 09/24/21 1226 09/25/21 0608 09/26/21 0243  WBC 4.8 4.9 5.7 8.2   HGB 6.7* 6.3* 7.2* 7.6*  HCT 21.5* 20.6* 22.7* 24.5*  MCV 96.0 96.7 94.2 93.2  PLT 197 215 215 176    Basic Metabolic Panel: Recent Labs  Lab 09/24/21 0109 09/24/21 1226 09/25/21 0608 09/26/21 0243  NA 137 141 142 140  K 4.2 5.4* 4.4 4.6  CL 110 113* 113* 109  CO2 14* 13* 15* 16*  GLUCOSE 91 83 81 99  BUN 92* 94* 94* 97*  CREATININE 10.85* 10.93* 10.93* 11.10*  CALCIUM 6.9* 7.2* 7.4* 7.3*  MG  --   --  1.9 2.0  PHOS 6.5*  --  6.6* 6.5*    GFR: Estimated Creatinine Clearance: 9.2 mL/min (A) (by C-G formula based on SCr of 11.1 mg/dL (H)). Liver Function Tests: Recent Labs  Lab 09/24/21 0109 09/25/21 0608 09/26/21 0243  AST  --   --  16  ALT  --   --  13  ALKPHOS  --   --  76  BILITOT  --   --  0.5  PROT  --   --  5.7*  ALBUMIN 3.1* 3.3*   3.2* 3.0*    No results for input(s): LIPASE, AMYLASE in the last 168 hours. No results for input(s): AMMONIA in  the last 168 hours. Coagulation Profile: No results for input(s): INR, PROTIME in the last 168 hours. Cardiac Enzymes: No results for input(s): CKTOTAL, CKMB, CKMBINDEX, TROPONINI in the last 168 hours. BNP (last 3 results) No results for input(s): PROBNP in the last 8760 hours. HbA1C: No results for input(s): HGBA1C in the last 72 hours. CBG: Recent Labs  Lab 09/25/21 0539  GLUCAP 78    Lipid Profile: No results for input(s): CHOL, HDL, LDLCALC, TRIG, CHOLHDL, LDLDIRECT in the last 72 hours. Thyroid Function Tests: No results for input(s): TSH, T4TOTAL, FREET4, T3FREE, THYROIDAB in the last 72 hours. Anemia Panel: Recent Labs    09/24/21 1915  FERRITIN 109  TIBC 284  IRON 56    Sepsis Labs: No results for input(s): PROCALCITON, LATICACIDVEN in the last 168 hours.  Recent Results (from the past 240 hour(s))  Resp Panel by RT-PCR (Flu A&B, Covid) Nasopharyngeal Swab     Status: None   Collection Time: 09/24/21  3:46 PM   Specimen: Nasopharyngeal Swab; Nasopharyngeal(NP) swabs in vial transport  medium  Result Value Ref Range Status   SARS Coronavirus 2 by RT PCR NEGATIVE NEGATIVE Final    Comment: (NOTE) SARS-CoV-2 target nucleic acids are NOT DETECTED.  The SARS-CoV-2 RNA is generally detectable in upper respiratory specimens during the acute phase of infection. The lowest concentration of SARS-CoV-2 viral copies this assay can detect is 138 copies/mL. A negative result does not preclude SARS-Cov-2 infection and should not be used as the sole basis for treatment or other patient management decisions. A negative result may occur with  improper specimen collection/handling, submission of specimen other than nasopharyngeal swab, presence of viral mutation(s) within the areas targeted by this assay, and inadequate number of viral copies(<138 copies/mL). A negative result must be combined with clinical observations, patient history, and epidemiological information. The expected result is Negative.  Fact Sheet for Patients:  EntrepreneurPulse.com.au  Fact Sheet for Healthcare Providers:  IncredibleEmployment.be  This test is no t yet approved or cleared by the [place] FDA and  has been authorized for detection and/or diagnosis of SARS-CoV-2 by FDA under an Emergency Use Authorization (EUA). This EUA will remain  in effect (meaning this test can be used) for the duration of the COVID-19 declaration under Section 564(b)(1) of the Act, 21 U.S.C.section 360bbb-3(b)(1), unless the authorization is terminated  or revoked sooner.       Influenza A by PCR NEGATIVE NEGATIVE Final   Influenza B by PCR NEGATIVE NEGATIVE Final    Comment: (NOTE) The Xpert Xpress SARS-CoV-2/FLU/RSV plus assay is intended as an aid in the diagnosis of influenza from Nasopharyngeal swab specimens and should not be used as a sole basis for treatment. Nasal washings and aspirates are unacceptable for Xpert Xpress SARS-CoV-2/FLU/RSV testing.  Fact Sheet for  Patients: EntrepreneurPulse.com.au  Fact Sheet for Healthcare Providers: IncredibleEmployment.be  This test is not yet approved or cleared by the [place] FDA and has been authorized for detection and/or diagnosis of SARS-CoV-2 by FDA under an Emergency Use Authorization (EUA). This EUA will remain in effect (meaning this test can be used) for the duration of the COVID-19 declaration under Section 564(b)(1) of the Act, 21 U.S.C. section 360bbb-3(b)(1), unless the authorization is terminated or revoked.  Performed at [facility], [place] [address] [place], [place] [address]        Radiology Studies: No results found.  Scheduled Meds:  amLODipine  10 mg Oral Daily   atorvastatin  40 mg  Oral Daily   calcium acetate  667 mg Oral TID WC   Chlorhexidine Gluconate Cloth  6 each Topical Q0600   cloNIDine  0.2 mg Oral TID   [START ON 10/02/2021] darbepoetin (ARANESP) injection - DIALYSIS  60 mcg Intravenous Q Sat-HD   hydrALAZINE  100 mg Oral TID   melatonin  10 mg Oral QHS   sodium bicarbonate  1,300 mg Oral TID   sodium chloride flush  10-40 mL Intracatheter Q12H   traZODone  100 mg Oral QHS   Continuous Infusions:  sodium chloride     sodium chloride     sodium chloride Stopped (09/25/21 0050)   diltiazem (CARDIZEM) infusion 15 mg/hr (09/25/21 0241)   furosemide 160 mg (09/26/21 2102)     LOS: 2 days   Time spent: 35 minutes   Merranda Bolls Loann Quill, MD [facility]  If 7PM-7AM, please contact night-coverage [email or web address] 09/27/2021, 10:35 AM

## 2021-09-27 NOTE — Progress Notes (Signed)
[facility] [place] PROGRESS NOTE  ASSESSMENT / PLAN: Stanley [name] is a 45 y.o. male with new diagnosis of ESRD. TDC placed yesterday. Hypoxemia prevented safe placement of AVG. Plan AVG prior to discharge.    SUBJECTIVE: Tolerating HD.  OBJECTIVE: BP (!) 202/110 (BP Location: Left Arm)    Pulse 61    Temp 97.7 F (36.5 C) (Temporal)    Resp 13    Ht 5\' 6"  (1.676 m)    Wt 96.2 kg    SpO2 100%    BMI 34.23 kg/m   Intake/Output Summary (Last 24 hours) at 09/27/2021 1137 Last data filed at 09/27/2021 0437 Gross per 24 hour  Intake 1452 ml  Output 3800 ml  Net -2348 ml     No distress. TDC in place.  Unlabored respirations. Neck soft  CBC Latest Ref Rng & Units 09/26/2021 09/25/2021 09/24/2021  WBC 4.0 - 10.5 K/uL 8.2 5.7 4.9  Hemoglobin 13.0 - 17.0 g/dL 7.6(L) 7.2(L) 6.3(LL)  Hematocrit 39.0 - 52.0 % 24.5(L) 22.7(L) 20.6(L)  Platelets 150 - 400 K/uL 250 215 215     CMP Latest Ref Rng & Units 09/26/2021 09/25/2021 09/24/2021  Glucose 70 - 99 mg/dL 99 81 83  BUN 6 - 20 mg/dL 97(H) 94(H) 94(H)  Creatinine 0.61 - 1.24 mg/dL 11.10(H) 10.93(H) 10.93(H)  Sodium 135 - 145 mmol/L 140 142 141  Potassium 3.5 - 5.1 mmol/L 4.6 4.4 5.4(H)  Chloride 98 - 111 mmol/L 109 113(H) 113(H)  CO2 22 - 32 mmol/L 16(L) 15(L) 13(L)  Calcium 8.9 - 10.3 mg/dL 7.3(L) 7.4(L) 7.2(L)  Total Protein 6.5 - 8.1 g/dL 5.7(L) - -  Total Bilirubin 0.3 - 1.2 mg/dL 0.5 - -  Alkaline Phos 38 - 126 U/L 76 - -  AST 15 - 41 U/L 16 - -  ALT 0 - 44 U/L 13 - -    Estimated Creatinine Clearance: 9.2 mL/min (A) (by C-G formula based on SCr of 11.1 mg/dL (H)).  Yevonne Aline. Stanford Breed, MD [facility] Phone Number: [phone] 09/27/2021 11:37 AM

## 2021-09-28 DIAGNOSIS — N186 End stage renal disease: Secondary | ICD-10-CM | POA: Diagnosis not present

## 2021-09-28 LAB — BASIC METABOLIC PANEL
Anion gap: 12 (ref 5–15)
BUN: 61 mg/dL — ABNORMAL HIGH (ref 6–20)
CO2: 22 mmol/L (ref 22–32)
Calcium: 7.2 mg/dL — ABNORMAL LOW (ref 8.9–10.3)
Chloride: 105 mmol/L (ref 98–111)
Creatinine, Ser: 7.76 mg/dL — ABNORMAL HIGH (ref 0.61–1.24)
GFR, Estimated: 8 mL/min — ABNORMAL LOW (ref >60)
Glucose, Bld: 92 mg/dL (ref 70–99)
Potassium: 3.7 mmol/L (ref 3.5–5.1)
Sodium: 139 mmol/L (ref 135–145)

## 2021-09-28 LAB — CBC
HCT: 21.4 % — ABNORMAL LOW (ref 39.0–52.0)
Hemoglobin: 7 g/dL — ABNORMAL LOW (ref 13.0–17.0)
MCH: 29.5 pg (ref 26.0–34.0)
MCHC: 32.7 g/dL (ref 30.0–36.0)
MCV: 90.3 fL (ref 80.0–100.0)
Platelets: 221 10*3/uL (ref 150–400)
RBC: 2.37 MIL/uL — ABNORMAL LOW (ref 4.22–5.81)
RDW: 16.3 % — ABNORMAL HIGH (ref 11.5–15.5)
WBC: 5.7 10*3/uL (ref 4.0–10.5)
nRBC: 0 % (ref 0.0–0.2)

## 2021-09-28 MED ORDER — DEXTROSE 5 % IV SOLN
160.0000 mg | INTRAVENOUS | Status: DC
Start: 2021-09-29 — End: 2021-09-30
  Administered 2021-09-29 (×3): 160 mg via INTRAVENOUS
  Filled 2021-09-28 (×3): qty 16

## 2021-09-28 NOTE — Progress Notes (Signed)
[facility] Progress Note   Subjective:    Seen and examined patient on HD today. Denies SOB, CP, and N/V. SBPs now in 180s. LE swelling slowly improving. Tolerating UFG 3.5L.  Objective Vitals:   09/28/21 0930 09/28/21 1000 09/28/21 1030 09/28/21 1100  BP: (!) 193/107 (!) 183/106 (!) 190/105 (!) 182/105  Pulse: 76 68 68 71  Resp:      Temp:      TempSrc:      SpO2:      Weight:      Height:       Physical Exam General: Middle-aged male; NAD Heart: S1 and S2; No murmurs, rales, or rhonchi Lungs: Noted fine crackles mid bilateral lobes/diminished; no wheezing or rhonchi Abdomen: Soft, non-tender, active bowel sounds Extremities: 1+ edema LE BLLE Dialysis Access: TDC placed 09/25/21 by Dr. Wynonia Sours Weights   09/27/21 0830 09/27/21 1114 09/28/21 0914  Weight: 96.2 kg 93.7 kg 91.5 kg    Intake/Output Summary (Last 24 hours) at 09/28/2021 1146 Last data filed at 09/28/2021 0800 Gross per 24 hour  Intake 796 ml  Output 3220 ml  Net -2424 ml    Additional Objective Labs: Basic Metabolic Panel: Recent Labs  Lab 09/24/21 0109 09/24/21 1226 09/25/21 0608 09/26/21 0243 09/27/21 1516 09/28/21 0341  NA 137   < > 142 140 138 139  K 4.2   < > 4.4 4.6 3.4* 3.7  CL 110   < > 113* 109 104 105  CO2 14*   < > 15* 16* 24 22  GLUCOSE 91   < > 81 99 91 92  BUN 92*   < > 94* 97* 54* 61*  CREATININE 10.85*   < > 10.93* 11.10* 7.08* 7.76*  CALCIUM 6.9*   < > 7.4* 7.3* 7.3* 7.2*  PHOS 6.5*  --  6.6* 6.5*  --   --    < > = values in this interval not displayed.   Liver Function Tests: Recent Labs  Lab 09/24/21 0109 09/25/21 0608 09/26/21 0243  AST  --   --  16  ALT  --   --  13  ALKPHOS  --   --  76  BILITOT  --   --  0.5  PROT  --   --  5.7*  ALBUMIN 3.1* 3.3*   3.2* 3.0*   No results for input(s): LIPASE, AMYLASE in the last 168 hours. CBC: Recent Labs  Lab 09/24/21 1226 09/25/21 0608 09/26/21 0243 09/27/21 1516 09/28/21 0341  WBC 4.9 5.7  8.2 5.7 5.7  HGB 6.3* 7.2* 7.6* 7.6* 7.0*  HCT 20.6* 22.7* 24.5* 23.1* 21.4*  MCV 96.7 94.2 93.2 90.6 90.3  PLT 215 215 250 233 221   Blood Culture    Component Value Date/Time   SDES URINE, CLEAN CATCH 02/10/2018 1652   SPECREQUEST NONE 02/10/2018 1652   CULT  02/10/2018 1652    NO GROWTH Performed at [facility], [place] [address], [place], [place] [address]    REPTSTATUS 02/11/2018 FINAL 02/10/2018 1652    Cardiac Enzymes: No results for input(s): CKTOTAL, CKMB, CKMBINDEX, TROPONINI in the last 168 hours. CBG: Recent Labs  Lab 09/25/21 0539  GLUCAP 78   Iron Studies: No results for input(s): IRON, TIBC, TRANSFERRIN, FERRITIN in the last 72 hours. Lab Results  Component Value Date   INR 1.1 08/23/2019   Studies/Results: No results found.  Medications:  sodium chloride     sodium chloride  sodium chloride Stopped (09/25/21 0050)   diltiazem (CARDIZEM) infusion 15 mg/hr (09/25/21 0241)   ferric gluconate (FERRLECIT) IVPB 125 mg (09/28/21 1008)   furosemide Stopped (09/27/21 2146)    amLODipine  10 mg Oral Daily   atorvastatin  40 mg Oral Daily   calcium acetate  667 mg Oral TID WC   Chlorhexidine Gluconate Cloth  6 each Topical Q0600   cloNIDine  0.2 mg Oral TID   [START ON 10/02/2021] darbepoetin (ARANESP) injection - DIALYSIS  60 mcg Intravenous Q Sat-HD   hydrALAZINE  100 mg Oral TID   melatonin  10 mg Oral QHS   sodium chloride flush  10-40 mL Intracatheter Q12H   traZODone  100 mg Oral QHS    Dialysis Orders: CLIP for outpatient HD placement  Assessment/Plan: Acute Hypoxic Respiratory Failure-in setting of volume overload. Please see #2 2.  ESRD -pt new to HD. First session 09/26/21. Received 2nd treatment 09/27/21-tolerated net UF 2.5L. On HD today for 3rd tx-slow start protocol-UFG 3.5L. Reached out to on-call renal navigator to ensure CLIP for outpatient HD placement has been initiated. 3. Vascular Access-s/p [facility] placment 12/24 by Dr. Stanford Breed.  Unable to place AVG earlier d/t hypoxemia. Pt now on RA with O2 sats 97-100%. Reviewed last VVS note-plan for AVG placement prior to discharge. 4. Anemia of CKD- Hgb 7.6, s/p 1 unit PRBCs 12/24. Iron studies from 12/23: Iron 56, Tsat 20%, and Ferritin 109-will order Fe series X 10. Continue Aranesp. Blood transfusion PRN for Hgb < 7.0 5. Secondary hyperparathyroidism - CorrCa 8.0-more likely will start Hectorol with HD, will order PTH in AM, continue binders 6. Metabolic Acidosis- Pt now on HD so will d/c PO sodium bicarb 7. HTN/volume - SBPs coming down slowly while on HD-now SBPs in 180s. New to HD-continue to push UFG. Continue Amlodipine, Clonidine, Hydralazine, and IV Lasix. 8. Nutrition - Continue renal diet on fluid restriction   Tobie Poet, NP [facility] 09/28/2021,11:46 AM  LOS: 3 days

## 2021-09-28 NOTE — Progress Notes (Signed)
°   09/28/21 1222  Vitals  Temp 99 F (37.2 C)  Temp Source Oral  BP (!) 210/106  BP Location Left Arm  BP Method Automatic  Patient Position (if appropriate) Lying  Pulse Rate Source Monitor  Resp 18  Oxygen Therapy  SpO2 100 %  O2 Device Room Air  Dialysis Weight  Weight 88.2 kg  Type of Weight Post-Dialysis  Post-Hemodialysis Assessment  Rinseback Volume (mL) 250 mL  KECN 246 V  Dialyzer Clearance Lightly streaked  Duration of HD Treatment -hour(s) 3 hour(s)  Hemodialysis Intake (mL) 800 mL  UF Total -Machine (mL) 3334 mL  Net UF (mL) 2534 mL  Tolerated HD Treatment Yes  Post-Hemodialysis Comments tx complete, pt stable  Hemodialysis Catheter Right Internal jugular Double lumen Permanent (Tunneled)  Placement Date/Time: 09/25/21 0836   Placed prior to admission: No  Time Out: Correct patient;Correct site;Correct procedure  Maximum sterile barrier precautions: Sterile gown;Mask;Cap;Large sterile sheet;Hand hygiene;Sterile gloves  Site Prep: (c) Ot...  Site Condition No complications  Blue Lumen Status Flushed;Heparin locked;Dead end cap in place  Red Lumen Status Flushed;Dead end cap in place;Heparin locked  Purple Lumen Status N/A  Catheter fill solution Heparin 1000 units/ml  Catheter fill volume (Arterial) 1.6 cc  Catheter fill volume (Venous) 1.6  Dressing Type Transparent  Dressing Status Clean;Dry;Intact  Antimicrobial disc in place? Yes  Interventions  (assessed)  Dressing Change Due 10/02/21  Post treatment catheter status Capped and Clamped   HD tx complete, pt hypertensive pre and throughout tx. Pt to receive BP meds upon return to unit. Pt stable at this time. Pt noted with reports of cramping midway tx, UF paused, NS 218ml given, with positive effects noted. UF goal lowered with remainder of tx. Loma Sousa, NP made aware.

## 2021-09-28 NOTE — Progress Notes (Signed)
Patient returned from HD at 1253hrs.

## 2021-09-28 NOTE — Progress Notes (Signed)
Patient did not get lasix 160mg  IV prior to HD. Notified nephrology, NP Ouida Sills, advised to hold morning dose today.

## 2021-09-28 NOTE — Progress Notes (Signed)
Patient going to HD at 0835hrs.

## 2021-09-28 NOTE — Anesthesia Preprocedure Evaluation (Addendum)
Anesthesia Evaluation  Patient identified by MRN, date of birth, ID band Patient awake    Reviewed: Allergy & Precautions, NPO status , Patient's Chart, lab work & pertinent test results  Airway Mallampati: II  TM Distance: >3 FB Neck ROM: Full    Dental  (+) Dental Advisory Given, Poor Dentition   Pulmonary shortness of breath, sleep apnea , former smoker,    Pulmonary exam normal breath sounds clear to auscultation       Cardiovascular hypertension, Pt. on home beta blockers and Pt. on medications pulmonary hypertension+ Peripheral Vascular Disease  Normal cardiovascular exam Rhythm:Regular Rate:Normal  Echo 11/2020 1. Left ventricular ejection fraction, by estimation, is 65 to 70%. The left ventricle has normal function. The left ventricle has no regional wall motion abnormalities. There is severe left ventricular hypertrophy. Left ventricular diastolic parametersare consistent with Grade I diastolic dysfunction (impaired relaxation).  2. Right ventricular systolic function is normal. The right ventricular size is normal. There is mildly elevated pulmonary artery systolic pressure. The estimated right ventricular systolic pressure is 00.9 mmHg.  3. Left atrial size was severely dilated.  4. Right atrial size was mildly dilated.  5. The mitral valve is normal in structure. Trivial mitral valve regurgitation. No evidence of mitral stenosis.  6. The aortic valve is tricuspid. Aortic valve regurgitation is mild. No aortic stenosis is present.  7. Aortic dilatation noted. There is mild dilatation of the ascending aorta, measuring 38 mm.  8. The inferior vena cava is dilated in size with >50% respiratory variability, suggesting right atrial pressure of 8 mmHg.  9. A small pericardial effusion is present.    Neuro/Psych CVA, No Residual Symptoms negative psych ROS   GI/Hepatic negative GI ROS, (+)     substance abuse   marijuana use,   Endo/Other  negative endocrine ROS  Renal/GU ESRFRenal disease     Musculoskeletal negative musculoskeletal ROS (+) narcotic dependent  Abdominal   Peds  Hematology negative hematology ROS (+)   Anesthesia Other Findings   Reproductive/Obstetrics                            Anesthesia Physical  Anesthesia Plan  ASA: 3  Anesthesia Plan: Regional   Post-op Pain Management:    Induction: Intravenous  PONV Risk Score and Plan: 2 and Ondansetron, Propofol infusion, Treatment may vary due to age or medical condition and Midazolam  Airway Management Planned: Natural Airway  Additional Equipment: None  Intra-op Plan:   Post-operative Plan:   Informed Consent: I have reviewed the patients History and Physical, chart, labs and discussed the procedure including the risks, benefits and alternatives for the proposed anesthesia with the patient or authorized representative who has indicated his/her understanding and acceptance.     Dental advisory given  Plan Discussed with: CRNA  Anesthesia Plan Comments:        Anesthesia Quick Evaluation

## 2021-09-28 NOTE — Progress Notes (Signed)
BP 196/97 this morning. IV Labetaol 20mg  given per PRN orders. Will continue tomonitor. Jessie Foot, RN

## 2021-09-28 NOTE — Progress Notes (Signed)
PROGRESS NOTE    [name]  YYT:[id number] DOB: 1976-03-31 DOA: 09/24/2021 PCP: Elsie Stain, MD   Brief Narrative:  Stanley [name] is a 45 y.o. male with medical history significant of chronic malignant hypertension, medical noncompliance, CKD stage V, OSA, chronic diastolic CHF.  He has had deterioration of his renal function and sent to the ED by his PCP/nephrology to be started on dialysis.    Assessment & Plan:  CKD stage V now progressed to ESRD Volume overload -Upon arrival: BUN 94, creatinine 10.9, GFR 5.  Creatinine was 6.8 in February 2022 and 9.0 in June 2022.  BNP 2528.   -S/p TDC placement on 12/24.  Hypoxemia prevented safe placement of AVG.  Plan is AVG placement prior to discharge-Appreciate vascular surgery help. -Started on hemodialysis on 09/26/2021 -Appreciate nephrology's help -HD on 12/27  Acute hypoxemic respiratory failure in the setting of fluid overload/pulmonary edema: -On admission patient requiring 2-3 L of oxygen via nasal cannula.  Chest x-ray shows pulmonary edema. -HD on 12/27  -down 10L since admission   Hypertensive emergency Malignant hypertension -Due to volume overload.   -Blood pressure Significantly elevated on arrival to the ED with systolic in the 275T and diastolic in the 700F.   -Continue diltiazem drip.  Continue amlodipine, hydralazine, Lasix, and clonidine.   -Hopefully his blood pressure stabilizes with hemodialysis.    Chest pain -Likely secondary to fluid overload/anxiety  Hyperkalemia: Status post Lokelma.  Resolved.   Anemia -Likely due to chronic renal disease.  Patient is not endorsing any symptoms of GI bleed.  Hemoglobin 6.3, was previously in the 8-10 range. -1 unit PRBCs administered in the ED- defer to renal  Secondary hyperparathyroidism: Continue PhosLo  Metabolic acidosis secondary to ESRD: Continue sodium bicarb   QT prolongation -Cardiac monitoring. -Avoid QT prolonging medication  DVT prophylaxis:  SCD/heparin  code Status: Full code Disposition Plan: To be determined- needs AVG prior to d/c  Consultants:  Nephrology Vascular surgery  Procedures:  Dialysis catheter placement on 09/25/2021   Status is: Inpatient     Subjective: In HD  Objective: Vitals:   09/28/21 1200 09/28/21 1218 09/28/21 1222 09/28/21 1251  BP: (!) 198/108 (!) 119/117 (!) 210/106 (!) 208/104  Pulse: 64 68  63  Resp:   18 18  Temp:   99 F (37.2 C) 98.6 F (37 C)  TempSrc:   Oral Oral  SpO2:   100% 99%  Weight:   88.2 kg   Height:        Intake/Output Summary (Last 24 hours) at 09/28/2021 1321 Last data filed at 09/28/2021 1222 Gross per 24 hour  Intake 476 ml  Output 5154 ml  Net -4678 ml   Filed Weights   09/27/21 1114 09/28/21 0914 09/28/21 1222  Weight: 93.7 kg 91.5 kg 88.2 kg    Examination:  In HD   Data Reviewed: I have personally reviewed following labs and imaging studies  CBC: Recent Labs  Lab 09/24/21 1226 09/25/21 0608 09/26/21 0243 09/27/21 1516 09/28/21 0341  WBC 4.9 5.7 8.2 5.7 5.7  HGB 6.3* 7.2* 7.6* 7.6* 7.0*  HCT 20.6* 22.7* 24.5* 23.1* 21.4*  MCV 96.7 94.2 93.2 90.6 90.3  PLT 215 215 250 233 749   Basic Metabolic Panel: Recent Labs  Lab 09/24/21 0109 09/24/21 1226 09/25/21 0608 09/26/21 0243 09/27/21 1516 09/28/21 0341  NA 137 141 142 140 138 139  K 4.2 5.4* 4.4 4.6 3.4* 3.7  CL 110 113* 113* 109 104  105  CO2 14* 13* 15* 16* 24 22  GLUCOSE 91 83 81 99 91 92  BUN 92* 94* 94* 97* 54* 61*  CREATININE 10.85* 10.93* 10.93* 11.10* 7.08* 7.76*  CALCIUM 6.9* 7.2* 7.4* 7.3* 7.3* 7.2*  MG  --   --  1.9 2.0 1.6*  --   PHOS 6.5*  --  6.6* 6.5*  --   --    GFR: Estimated Creatinine Clearance: 12.6 mL/min (A) (by C-G formula based on SCr of 7.76 mg/dL (H)). Liver Function Tests: Recent Labs  Lab 09/24/21 0109 09/25/21 0608 09/26/21 0243  AST  --   --  16  ALT  --   --  13  ALKPHOS  --   --  76  BILITOT  --   --  0.5  PROT  --   --  5.7*   ALBUMIN 3.1* 3.3*   3.2* 3.0*   No results for input(s): LIPASE, AMYLASE in the last 168 hours. No results for input(s): AMMONIA in the last 168 hours. Coagulation Profile: No results for input(s): INR, PROTIME in the last 168 hours. Cardiac Enzymes: No results for input(s): CKTOTAL, CKMB, CKMBINDEX, TROPONINI in the last 168 hours. BNP (last 3 results) No results for input(s): PROBNP in the last 8760 hours. HbA1C: No results for input(s): HGBA1C in the last 72 hours. CBG: Recent Labs  Lab 09/25/21 0539  GLUCAP 78   Lipid Profile: No results for input(s): CHOL, HDL, LDLCALC, TRIG, CHOLHDL, LDLDIRECT in the last 72 hours. Thyroid Function Tests: No results for input(s): TSH, T4TOTAL, FREET4, T3FREE, THYROIDAB in the last 72 hours. Anemia Panel: No results for input(s): VITAMINB12, FOLATE, FERRITIN, TIBC, IRON, RETICCTPCT in the last 72 hours. Sepsis Labs: No results for input(s): PROCALCITON, LATICACIDVEN in the last 168 hours.  Recent Results (from the past 240 hour(s))  Resp Panel by RT-PCR (Flu A&B, Covid) Nasopharyngeal Swab     Status: None   Collection Time: 09/24/21  3:46 PM   Specimen: Nasopharyngeal Swab; Nasopharyngeal(NP) swabs in vial transport medium  Result Value Ref Range Status   SARS Coronavirus 2 by RT PCR NEGATIVE NEGATIVE Final    Comment: (NOTE) SARS-CoV-2 target nucleic acids are NOT DETECTED.  The SARS-CoV-2 RNA is generally detectable in upper respiratory specimens during the acute phase of infection. The lowest concentration of SARS-CoV-2 viral copies this assay can detect is 138 copies/mL. A negative result does not preclude SARS-Cov-2 infection and should not be used as the sole basis for treatment or other patient management decisions. A negative result may occur with  improper specimen collection/handling, submission of specimen other than nasopharyngeal swab, presence of viral mutation(s) within the areas targeted by this assay, and  inadequate number of viral copies(<138 copies/mL). A negative result must be combined with clinical observations, patient history, and epidemiological information. The expected result is Negative.  Fact Sheet for Patients:  EntrepreneurPulse.com.au  Fact Sheet for Healthcare Providers:  IncredibleEmployment.be  This test is no t yet approved or cleared by the [place] FDA and  has been authorized for detection and/or diagnosis of SARS-CoV-2 by FDA under an Emergency Use Authorization (EUA). This EUA will remain  in effect (meaning this test can be used) for the duration of the COVID-19 declaration under Section 564(b)(1) of the Act, 21 U.S.C.section 360bbb-3(b)(1), unless the authorization is terminated  or revoked sooner.       Influenza A by PCR NEGATIVE NEGATIVE Final   Influenza B by PCR NEGATIVE NEGATIVE Final  Comment: (NOTE) The Xpert Xpress SARS-CoV-2/FLU/RSV plus assay is intended as an aid in the diagnosis of influenza from Nasopharyngeal swab specimens and should not be used as a sole basis for treatment. Nasal washings and aspirates are unacceptable for Xpert Xpress SARS-CoV-2/FLU/RSV testing.  Fact Sheet for Patients: EntrepreneurPulse.com.au  Fact Sheet for Healthcare Providers: IncredibleEmployment.be  This test is not yet approved or cleared by the [place] FDA and has been authorized for detection and/or diagnosis of SARS-CoV-2 by FDA under an Emergency Use Authorization (EUA). This EUA will remain in effect (meaning this test can be used) for the duration of the COVID-19 declaration under Section 564(b)(1) of the Act, 21 U.S.C. section 360bbb-3(b)(1), unless the authorization is terminated or revoked.  Performed at [facility], [address], [place], [place] [address]        Radiology Studies: No results found.  Scheduled Meds:  amLODipine  10 mg Oral  Daily   atorvastatin  40 mg Oral Daily   calcium acetate  667 mg Oral TID WC   Chlorhexidine Gluconate Cloth  6 each Topical Q0600   cloNIDine  0.2 mg Oral TID   [START ON 10/02/2021] darbepoetin (ARANESP) injection - DIALYSIS  60 mcg Intravenous Q Sat-HD   hydrALAZINE  100 mg Oral TID   melatonin  10 mg Oral QHS   sodium chloride flush  10-40 mL Intracatheter Q12H   traZODone  100 mg Oral QHS   Continuous Infusions:  sodium chloride     sodium chloride     sodium chloride Stopped (09/25/21 0050)   diltiazem (CARDIZEM) infusion 15 mg/hr (09/25/21 0241)   ferric gluconate (FERRLECIT) IVPB Stopped (09/28/21 1212)   furosemide Stopped (09/27/21 2146)     LOS: 3 days   Time spent: 35 minutes   Geradine Girt, DO [facility]  If 7PM-7AM, please contact night-coverage [email or web address] 09/28/2021, 1:21 PM

## 2021-09-29 ENCOUNTER — Encounter (HOSPITAL_COMMUNITY)
Admission: EM | Disposition: A | Discharge status: Home / Self Care | Payer: Self-pay | Source: Ambulatory Visit | Attending: Internal Medicine

## 2021-09-29 ENCOUNTER — Inpatient Hospital Stay (HOSPITAL_COMMUNITY): Payer: Medicaid Other

## 2021-09-29 DIAGNOSIS — Z992 Dependence on renal dialysis: Secondary | ICD-10-CM

## 2021-09-29 DIAGNOSIS — N186 End stage renal disease: Secondary | ICD-10-CM

## 2021-09-29 HISTORY — PX: AV FISTULA PLACEMENT: SHX1204

## 2021-09-29 LAB — CBC WITH DIFFERENTIAL/PLATELET
Abs Immature Granulocytes: 0.04 10*3/uL (ref 0.00–0.07)
Basophils Absolute: 0 10*3/uL (ref 0.0–0.1)
Basophils Relative: 1 %
Eosinophils Absolute: 0.2 10*3/uL (ref 0.0–0.5)
Eosinophils Relative: 4 %
HCT: 22.9 % — ABNORMAL LOW (ref 39.0–52.0)
Hemoglobin: 7.5 g/dL — ABNORMAL LOW (ref 13.0–17.0)
Immature Granulocytes: 1 %
Lymphocytes Relative: 12 %
Lymphs Abs: 0.6 10*3/uL — ABNORMAL LOW (ref 0.7–4.0)
MCH: 29.8 pg (ref 26.0–34.0)
MCHC: 32.8 g/dL (ref 30.0–36.0)
MCV: 90.9 fL (ref 80.0–100.0)
Monocytes Absolute: 0.5 10*3/uL (ref 0.1–1.0)
Monocytes Relative: 11 %
Neutro Abs: 3.6 10*3/uL (ref 1.7–7.7)
Neutrophils Relative %: 71 %
Platelets: 228 10*3/uL (ref 150–400)
RBC: 2.52 MIL/uL — ABNORMAL LOW (ref 4.22–5.81)
RDW: 16 % — ABNORMAL HIGH (ref 11.5–15.5)
WBC: 5.1 10*3/uL (ref 4.0–10.5)
nRBC: 0 % (ref 0.0–0.2)

## 2021-09-29 LAB — RENAL FUNCTION PANEL
Albumin: 2.6 g/dL — ABNORMAL LOW (ref 3.5–5.0)
Anion gap: 9 (ref 5–15)
BUN: 40 mg/dL — ABNORMAL HIGH (ref 6–20)
CO2: 26 mmol/L (ref 22–32)
Calcium: 7.6 mg/dL — ABNORMAL LOW (ref 8.9–10.3)
Chloride: 101 mmol/L (ref 98–111)
Creatinine, Ser: 6.1 mg/dL — ABNORMAL HIGH (ref 0.61–1.24)
GFR, Estimated: 11 mL/min — ABNORMAL LOW (ref >60)
Glucose, Bld: 91 mg/dL (ref 70–99)
Phosphorus: 4.6 mg/dL (ref 2.5–4.6)
Potassium: 3.9 mmol/L (ref 3.5–5.1)
Sodium: 136 mmol/L (ref 135–145)

## 2021-09-29 LAB — GLUCOSE, CAPILLARY: Glucose-Capillary: 100 mg/dL — ABNORMAL HIGH (ref 70–99)

## 2021-09-29 LAB — MAGNESIUM: Magnesium: 1.8 mg/dL (ref 1.7–2.4)

## 2021-09-29 SURGERY — ARTERIOVENOUS (AV) FISTULA CREATION
Anesthesia: Regional | Site: Arm Lower | Laterality: Left

## 2021-09-29 MED ORDER — HYDRALAZINE HCL 20 MG/ML IJ SOLN: INTRAMUSCULAR | Status: DC | PRN

## 2021-09-29 MED ORDER — CEFAZOLIN SODIUM-DEXTROSE 2-3 GM-%(50ML) IV SOLR
INTRAVENOUS | Status: DC | PRN
  Administered 2021-09-29: 2 g via INTRAVENOUS

## 2021-09-29 MED ORDER — PROPOFOL 10 MG/ML IV BOLUS
INTRAVENOUS | Status: AC
  Filled 2021-09-29: qty 20

## 2021-09-29 MED ORDER — FENTANYL CITRATE (PF) 250 MCG/5ML IJ SOLN
INTRAMUSCULAR | Status: AC
  Filled 2021-09-29: qty 5

## 2021-09-29 MED ORDER — PROPOFOL 500 MG/50ML IV EMUL
INTRAVENOUS | Status: DC | PRN
  Administered 2021-09-29: 50 ug/kg/min via INTRAVENOUS

## 2021-09-29 MED ORDER — 0.9 % SODIUM CHLORIDE (POUR BTL) OPTIME
TOPICAL | Status: DC | PRN
  Administered 2021-09-29: 07:00:00 1000 mL

## 2021-09-29 MED ORDER — HYDRALAZINE HCL 20 MG/ML IJ SOLN
INTRAMUSCULAR | Status: DC | PRN
  Administered 2021-09-29: 5 mg via INTRAVENOUS
  Administered 2021-09-29: 10 mg via INTRAVENOUS

## 2021-09-29 MED ORDER — HYDRALAZINE HCL 20 MG/ML IJ SOLN
INTRAMUSCULAR | Status: AC
  Filled 2021-09-29: qty 1

## 2021-09-29 MED ORDER — ONDANSETRON HCL 4 MG/2ML IJ SOLN
INTRAMUSCULAR | Status: AC
  Filled 2021-09-29: qty 2

## 2021-09-29 MED ORDER — HYDRALAZINE HCL 20 MG/ML IJ SOLN
INTRAMUSCULAR | Status: AC
  Administered 2021-09-29: 11:00:00 10 mg via INTRAVENOUS
  Filled 2021-09-29: qty 1

## 2021-09-29 MED ORDER — FENTANYL CITRATE (PF) 100 MCG/2ML IJ SOLN: 25.0000 ug | INTRAMUSCULAR | Status: DC | PRN

## 2021-09-29 MED ORDER — FENTANYL CITRATE (PF) 250 MCG/5ML IJ SOLN
INTRAMUSCULAR | Status: DC | PRN
  Administered 2021-09-29 (×2): 50 ug via INTRAVENOUS

## 2021-09-29 MED ORDER — ONDANSETRON HCL 4 MG/2ML IJ SOLN
INTRAMUSCULAR | Status: DC | PRN
  Administered 2021-09-29: 4 mg via INTRAVENOUS

## 2021-09-29 MED ORDER — PROPOFOL 10 MG/ML IV BOLUS
INTRAVENOUS | Status: DC | PRN
  Administered 2021-09-29 (×2): 20 mg via INTRAVENOUS

## 2021-09-29 MED ORDER — HEPARIN 6000 UNIT IRRIGATION SOLUTION
Status: DC | PRN
  Administered 2021-09-29: 1

## 2021-09-29 MED ORDER — PAPAVERINE HCL 30 MG/ML IJ SOLN
INTRAMUSCULAR | Status: AC
  Filled 2021-09-29: qty 2

## 2021-09-29 MED ORDER — PROMETHAZINE HCL 25 MG/ML IJ SOLN: 6.2500 mg | INTRAMUSCULAR | Status: DC | PRN

## 2021-09-29 MED ORDER — LIDOCAINE-EPINEPHRINE (PF) 1.5 %-1:200000 IJ SOLN
INTRAMUSCULAR | Status: DC | PRN
  Administered 2021-09-29: 10 mL via PERINEURAL

## 2021-09-29 MED ORDER — LIDOCAINE HCL (PF) 1.5 % IJ SOLN
INTRAMUSCULAR | Status: DC | PRN
  Administered 2021-09-29: 10 mL via INTRADERMAL

## 2021-09-29 MED ORDER — PROPOFOL 1000 MG/100ML IV EMUL
INTRAVENOUS | Status: AC
  Filled 2021-09-29: qty 100

## 2021-09-29 MED ORDER — SODIUM CHLORIDE 0.9 % IV SOLN: 100.0000 mL | INTRAVENOUS | Status: DC | PRN

## 2021-09-29 MED ORDER — LIDOCAINE-EPINEPHRINE (PF) 1 %-1:200000 IJ SOLN
INTRAMUSCULAR | Status: AC
  Filled 2021-09-29: qty 30

## 2021-09-29 MED ORDER — LIDOCAINE 2% (20 MG/ML) 5 ML SYRINGE
INTRAMUSCULAR | Status: DC | PRN
  Administered 2021-09-29: 10 mg via INTRAVENOUS

## 2021-09-29 MED ORDER — HYDRALAZINE HCL 20 MG/ML IJ SOLN
10.0000 mg | Freq: Once | INTRAMUSCULAR | Status: AC
  Administered 2021-09-29: 09:00:00 10 mg via INTRAVENOUS

## 2021-09-29 MED ORDER — HYDRALAZINE HCL 20 MG/ML IJ SOLN
10.0000 mg | INTRAMUSCULAR | Status: DC | PRN
  Administered 2021-09-30: 10 mg via INTRAVENOUS
  Filled 2021-09-29 (×2): qty 1

## 2021-09-29 MED ORDER — SODIUM CHLORIDE 0.9 % IV SOLN: INTRAVENOUS | Status: DC | PRN

## 2021-09-29 MED ORDER — HEPARIN 6000 UNIT IRRIGATION SOLUTION
Status: AC
  Filled 2021-09-29: qty 500

## 2021-09-29 MED ORDER — MIDAZOLAM HCL 2 MG/2ML IJ SOLN
INTRAMUSCULAR | Status: DC | PRN
  Administered 2021-09-29: 2 mg via INTRAVENOUS

## 2021-09-29 MED ORDER — MIDAZOLAM HCL 2 MG/2ML IJ SOLN
INTRAMUSCULAR | Status: AC
  Filled 2021-09-29: qty 2

## 2021-09-29 SURGICAL SUPPLY — 39 items
ARMBAND PINK RESTRICT EXTREMIT (MISCELLANEOUS) ×4 IMPLANT
BENZOIN TINCTURE PRP APPL 2/3 (GAUZE/BANDAGES/DRESSINGS) ×4 IMPLANT
CANISTER SUCT 3000ML PPV (MISCELLANEOUS) ×4 IMPLANT
CANNULA VESSEL 3MM 2 BLNT TIP (CANNULA) ×4 IMPLANT
CHLORAPREP W/TINT 26 (MISCELLANEOUS) ×4 IMPLANT
CLOSURE WOUND 1/2 X4 (GAUZE/BANDAGES/DRESSINGS) ×1
DERMABOND ADVANCED (GAUZE/BANDAGES/DRESSINGS) ×4
DERMABOND ADVANCED .7 DNX12 (GAUZE/BANDAGES/DRESSINGS) ×2 IMPLANT
ELECT REM PT RETURN 9FT ADLT (ELECTROSURGICAL) ×4
ELECTRODE REM PT RTRN 9FT ADLT (ELECTROSURGICAL) ×2 IMPLANT
GAUZE 4X4 16PLY ~~LOC~~+RFID DBL (SPONGE) ×3 IMPLANT
GLOVE SURG ENC MOIS LTX SZ8 (GLOVE) ×4 IMPLANT
GOWN STRL REUS W/ TWL LRG LVL3 (GOWN DISPOSABLE) ×4 IMPLANT
GOWN STRL REUS W/ TWL XL LVL3 (GOWN DISPOSABLE) ×2 IMPLANT
GOWN STRL REUS W/TWL LRG LVL3 (GOWN DISPOSABLE) ×8
GOWN STRL REUS W/TWL XL LVL3 (GOWN DISPOSABLE) ×4
HEMOSTAT SNOW SURGICEL 2X4 (HEMOSTASIS) IMPLANT
INSERT FOGARTY SM (MISCELLANEOUS) ×4 IMPLANT
KIT BASIN OR (CUSTOM PROCEDURE TRAY) ×4 IMPLANT
KIT TURNOVER KIT B (KITS) ×4 IMPLANT
NDL 18GX1X1/2 (RX/OR ONLY) (NEEDLE) IMPLANT
NEEDLE 18GX1X1/2 (RX/OR ONLY) (NEEDLE) IMPLANT
NS IRRIG 1000ML POUR BTL (IV SOLUTION) ×4 IMPLANT
PACK CV ACCESS (CUSTOM PROCEDURE TRAY) ×4 IMPLANT
PAD ARMBOARD 7.5X6 YLW CONV (MISCELLANEOUS) ×8 IMPLANT
SLING ARM FOAM STRAP LRG (SOFTGOODS) ×3 IMPLANT
SPONGE T-LAP 18X18 ~~LOC~~+RFID (SPONGE) ×3 IMPLANT
STRIP CLOSURE SKIN 1/2X4 (GAUZE/BANDAGES/DRESSINGS) ×3 IMPLANT
SUT GORETEX 6.0 TT9 (SUTURE) ×4 IMPLANT
SUT MNCRL AB 4-0 PS2 18 (SUTURE) ×3 IMPLANT
SUT PROLENE 6 0 BV (SUTURE) ×6 IMPLANT
SUT SILK 2 0 SH (SUTURE) IMPLANT
SUT VIC AB 3-0 SH 27 (SUTURE) ×4
SUT VIC AB 3-0 SH 27X BRD (SUTURE) ×3 IMPLANT
SYR 3ML LL SCALE MARK (SYRINGE) IMPLANT
SYR TOOMEY 50ML (SYRINGE) IMPLANT
TOWEL GREEN STERILE (TOWEL DISPOSABLE) ×4 IMPLANT
UNDERPAD 30X36 HEAVY ABSORB (UNDERPADS AND DIAPERS) ×4 IMPLANT
WATER STERILE IRR 1000ML POUR (IV SOLUTION) ×4 IMPLANT

## 2021-09-29 NOTE — Anesthesia Postprocedure Evaluation (Signed)
Anesthesia Post Note  Patient: Stanley [name]  Procedure(s) Performed: LEFT RADIOCEPHALIC ARTERIOVENOUS (AV) FISTULA CREATION (Left: Arm Lower)     Patient location during evaluation: PACU Anesthesia Type: Regional Level of consciousness: awake and alert Pain management: pain level controlled Vital Signs Assessment: post-procedure vital signs reviewed and stable Respiratory status: spontaneous breathing Cardiovascular status: stable Anesthetic complications: no   No notable events documented.  Last Vitals:  Vitals:   09/29/21 0930 09/29/21 1019  BP: (!) 178/101 (!) 181/98  Pulse: 66   Resp: 11   Temp: 36.6 C   SpO2: 95% 100%    Last Pain:  Vitals:   09/29/21 1019  TempSrc:   PainSc: 0-No pain                 Nolon Nations

## 2021-09-29 NOTE — Transfer of Care (Signed)
Immediate Anesthesia Transfer of Care Note  Patient: Stanley [name]  Procedure(s) Performed: LEFT RADIOCEPHALIC ARTERIOVENOUS (AV) FISTULA CREATION (Left: Arm Lower)  Patient Location: PACU  Anesthesia Type:MAC combined with regional for post-op pain  Level of Consciousness: drowsy, patient cooperative and responds to stimulation  Airway & Oxygen Therapy: Patient Spontanous Breathing  Post-op Assessment: Report given to RN and Post -op Vital signs reviewed and stable  Post vital signs: Reviewed and stable  Last Vitals:  Vitals Value Taken Time  BP 174/98 09/29/21 1049  Temp 36.6 C 09/29/21 0930  Pulse 70 09/29/21 0931  Resp 12 09/29/21 0930  SpO2 100 % 09/29/21 1108  Vitals shown include unvalidated device data.  Last Pain:  Vitals:   09/29/21 1019  TempSrc:   PainSc: 0-No pain      Patients Stated Pain Goal: 0 (41/71/27 8718)  Complications: No notable events documented.

## 2021-09-29 NOTE — Progress Notes (Signed)
Navigator reached out to pt this am via phone. Pt handed phone to pt's brother, Tishon, to finish conversation. Pt's brother, Bedelia Person, advised navigator that pt has another brother who receives out-pt HD at [facility] Family would like pt and pt's brother to receive HD on same schedule if possible for transportation reasons. Explained that may not be an option but would investigate. Discussed situation with [facility] admissions and [facility] clinic Freight forwarder. Pt and pt's brother will not be able to be on the same schedule. Pt has been given a TTS schedule. Pt will need to arrive at 6:20 for 6:40 chair time. Clinic is unable to start pt tomorrow. Pt will need to be at the clinic at 3:00 on Friday to complete paperwork in order to start at the clinic on Saturday. Spoke to pt's brother, Tishon, via phone to provide the above details. Arrangements documented on AVS and information sheet provided to pt at bedside. Met with pt at bedside but he did seem rather drowsy from procedure earlier today. Nephrologist and renal NP made aware of the above details. Pt plans to drive self to HD appointments at d/c. Will assist as needed.   Melven Sartorius Renal Navigator [phone]

## 2021-09-29 NOTE — Progress Notes (Signed)
[facility] Progress Note   Subjective:    Seen and examined patient at bedside. S/p L AVG placement by Dr. Stanford Breed today. Patient still drowsy. Denies SOB, CP, palpitations, and N/V. Discussing with Renal Navigator. Informed by HD RN of patient cramping near end of treatment yesterday-tolerated net UF 2.5L. Anticipate for discharge soon once outpatient HD center has been confirmed  Objective Vitals:   09/29/21 0920 09/29/21 0930 09/29/21 1019 09/29/21 1220  BP: (!) 183/101 (!) 178/101 (!) 181/98 (!) 152/89  Pulse:  66  80  Resp:  11  18  Temp:  97.8 F (36.6 C)  98.5 F (36.9 C)  TempSrc:    Oral  SpO2:  95% 100%   Weight:      Height:       Physical Exam General: Middle-aged male; NAD Heart: S1 and S2; No murmurs, rales, or rhonchi Lungs: Noted fine crackles mid bilateral lobes/diminished; no wheezing or rhonchi Abdomen: Soft, non-tender, active bowel sounds Extremities: 1+ edema LE BLLE Dialysis Access: TDC placed 09/25/21 by Dr. Wynonia Sours Weights   09/27/21 1114 09/28/21 0914 09/28/21 1222  Weight: 93.7 kg 91.5 kg 88.2 kg    Intake/Output Summary (Last 24 hours) at 09/29/2021 1439 Last data filed at 09/29/2021 0930 Gross per 24 hour  Intake 715.26 ml  Output 360 ml  Net 355.26 ml    Additional Objective Labs: Basic Metabolic Panel: Recent Labs  Lab 09/25/21 0608 09/26/21 0243 09/27/21 1516 09/28/21 0341 09/29/21 0434  NA 142 140 138 139 136  K 4.4 4.6 3.4* 3.7 3.9  CL 113* 109 104 105 101  CO2 15* 16* 24 22 26   GLUCOSE 81 99 91 92 91  BUN 94* 97* 54* 61* 40*  CREATININE 10.93* 11.10* 7.08* 7.76* 6.10*  CALCIUM 7.4* 7.3* 7.3* 7.2* 7.6*  PHOS 6.6* 6.5*  --   --  4.6   Liver Function Tests: Recent Labs  Lab 09/25/21 0608 09/26/21 0243 09/29/21 0434  AST  --  16  --   ALT  --  13  --   ALKPHOS  --  76  --   BILITOT  --  0.5  --   PROT  --  5.7*  --   ALBUMIN 3.3*   3.2* 3.0* 2.6*   No results for input(s): LIPASE, AMYLASE  in the last 168 hours. CBC: Recent Labs  Lab 09/25/21 0608 09/26/21 0243 09/27/21 1516 09/28/21 0341 09/29/21 0434  WBC 5.7 8.2 5.7 5.7 5.1  NEUTROABS  --   --   --   --  3.6  HGB 7.2* 7.6* 7.6* 7.0* 7.5*  HCT 22.7* 24.5* 23.1* 21.4* 22.9*  MCV 94.2 93.2 90.6 90.3 90.9  PLT 215 250 233 221 228   Blood Culture    Component Value Date/Time   SDES URINE, CLEAN CATCH 02/10/2018 1652   SPECREQUEST NONE 02/10/2018 1652   CULT  02/10/2018 1652    NO GROWTH Performed at [facility], [place] [address], [place], [place] [address]    REPTSTATUS 02/11/2018 FINAL 02/10/2018 1652    Cardiac Enzymes: No results for input(s): CKTOTAL, CKMB, CKMBINDEX, TROPONINI in the last 168 hours. CBG: Recent Labs  Lab 09/25/21 0539 09/29/21 0856  GLUCAP 78 100*   Iron Studies: No results for input(s): IRON, TIBC, TRANSFERRIN, FERRITIN in the last 72 hours. Lab Results  Component Value Date   INR 1.1 08/23/2019   Studies/Results: No results found.  Medications:  sodium chloride  sodium chloride     sodium chloride Stopped (09/25/21 0050)   ferric gluconate (FERRLECIT) IVPB Stopped (09/28/21 1109)   furosemide 160 mg (09/29/21 1126)    amLODipine  10 mg Oral Daily   atorvastatin  40 mg Oral Daily   calcium acetate  667 mg Oral TID WC   Chlorhexidine Gluconate Cloth  6 each Topical Q0600   cloNIDine  0.2 mg Oral TID   [START ON 10/02/2021] darbepoetin (ARANESP) injection - DIALYSIS  60 mcg Intravenous Q Sat-HD   hydrALAZINE  100 mg Oral TID   melatonin  10 mg Oral QHS   sodium chloride flush  10-40 mL Intracatheter Q12H   traZODone  100 mg Oral QHS    Dialysis Orders: CLIP for outpatient HD placement  Assessment/Plan: Acute Hypoxic Respiratory Failure-in setting of volume overload. Please see #2 2.  ESRD -pt new to HD. First session 09/26/21, 2nd session: 09/27/21, and 3rd session 09/28/21. He reported some cramping near end of treatment yesterday-tolerated net UF 2.5L.  Discussed with renal navigator-anticipate discharge soon once outpatient HD center has been confirmed. Plan for HD 09/30/21 if patient is still here. 3. Vascular Access-s/p [facility] placment 12/24 by Dr. Stanford Breed. S/p L AVG placement tofay by Dr. Stanford Breed 4. Anemia of CKD- Hgb 7.6, s/p 1 unit PRBCs 12/24. Iron studies from 12/23: Iron 56, Tsat 20%, and Ferritin 109-will order Fe series X 10. Continue Aranesp. Blood transfusion PRN for Hgb < 7.0 5. Secondary hyperparathyroidism - CorrCa 8.0, PTH results pending, will start VDRA in outpatient, continue binders 6. Metabolic Acidosis- Pt now on HD so will d/c PO sodium bicarb 7. HTN/volume - SBPs coming down slowly while on HD. New to HD-continue to push UFG. Continue Amlodipine, Clonidine, Hydralazine. Continue IV Lasix BID on non-dialysis days only now patient is on HD. 8. Nutrition - Continue renal diet on fluid restriction 9. Disposition: Discussed with renal navigator. Continuing to work on outpatient HD placement. Patient would like to be at same HD center as his brother. Anticipate discharge soon.  Tobie Poet, NP [facility] 09/29/2021,2:39 PM  LOS: 4 days

## 2021-09-29 NOTE — Progress Notes (Signed)
PROGRESS NOTE    Stanley [name]  KXF:[id number] DOB: [birth year] DOA: 09/24/2021 PCP: Elsie Stain, MD   Brief Narrative:  Stanley [name] is a 45 y.o. male with medical history significant of chronic malignant hypertension, medical noncompliance, CKD stage V, OSA, chronic diastolic CHF.  He has had deterioration of his renal function and sent to the ED by his PCP/nephrology to be started on dialysis.    Assessment & Plan:  CKD stage V now progressed to ESRD Volume overload -Upon arrival: BUN 94, creatinine 10.9, GFR 5.  Creatinine was 6.8 in February 2022 and 9.0 in June 2022.  BNP 2528.   -S/p TDC placement on 12/24.  AVG placed 12/28 -Started on hemodialysis on 09/26/2021 -Appreciate nephrology's help -HD on 12/27  Acute hypoxemic respiratory failure in the setting of fluid overload/pulmonary edema: -On admission patient requiring 2-3 L of oxygen via nasal cannula.  Chest x-ray shows pulmonary edema. -HD on 12/27  -down 10L since admission   Hypertensive emergency Malignant hypertension -Due to volume overload.   -Blood pressure Significantly elevated on arrival to the ED with systolic in the 696V and diastolic in the 893Y.     Continue amlodipine, hydralazine, Lasix, and clonidine.   -Hopefully his blood pressure stabilizes with hemodialysis.    Chest pain -Likely secondary to fluid overload/anxiety  Hyperkalemia: Status post Lokelma.  Resolved.   Anemia -Likely due to chronic renal disease.  Patient is not endorsing any symptoms of GI bleed.  Hemoglobin 6.3, was previously in the 8-10 range. -1 unit PRBCs administered in the ED- defer to renal  Secondary hyperparathyroidism: Continue PhosLo  Metabolic acidosis secondary to ESRD: Continue sodium bicarb   QT prolongation -Cardiac monitoring. -Avoid QT prolonging medication  DVT prophylaxis: SCD/heparin  code Status: Full code Disposition Plan: To be determined- needs CLIP  Consultants:  Nephrology Vascular  surgery  Procedures:  Dialysis catheter placement on 09/25/2021 AVg: 12/28   Status is: Inpatient     Subjective: Back from procedure  Objective: Vitals:   09/29/21 0920 09/29/21 0930 09/29/21 1019 09/29/21 1220  BP: (!) 183/101 (!) 178/101 (!) 181/98 (!) 152/89  Pulse:  66  80  Resp:  11  18  Temp:  97.8 F (36.6 C)  98.5 F (36.9 C)  TempSrc:    Oral  SpO2:  95% 100%   Weight:      Height:        Intake/Output Summary (Last 24 hours) at 09/29/2021 1351 Last data filed at 09/29/2021 0930 Gross per 24 hour  Intake 715.26 ml  Output 360 ml  Net 355.26 ml   Filed Weights   09/27/21 1114 09/28/21 0914 09/28/21 1222  Weight: 93.7 kg 91.5 kg 88.2 kg    Examination:   General: Appearance:    Obese male in no acute distress     Lungs:     respirations unlabored  Heart:    Normal heart rate.    MS:   All extremities are intact.    Neurologic:   Sleepy, back from procedure       Data Reviewed: I have personally reviewed following labs and imaging studies  CBC: Recent Labs  Lab 09/25/21 0608 09/26/21 0243 09/27/21 1516 09/28/21 0341 09/29/21 0434  WBC 5.7 8.2 5.7 5.7 5.1  NEUTROABS  --   --   --   --  3.6  HGB 7.2* 7.6* 7.6* 7.0* 7.5*  HCT 22.7* 24.5* 23.1* 21.4* 22.9*  MCV 94.2 93.2 90.6 90.3 90.9  PLT 215 250 233 221 458   Basic Metabolic Panel: Recent Labs  Lab 09/24/21 0109 09/24/21 1226 09/25/21 0608 09/26/21 0243 09/27/21 1516 09/28/21 0341 09/29/21 0434  NA 137   < > 142 140 138 139 136  K 4.2   < > 4.4 4.6 3.4* 3.7 3.9  CL 110   < > 113* 109 104 105 101  CO2 14*   < > 15* 16* 24 22 26   GLUCOSE 91   < > 81 99 91 92 91  BUN 92*   < > 94* 97* 54* 61* 40*  CREATININE 10.85*   < > 10.93* 11.10* 7.08* 7.76* 6.10*  CALCIUM 6.9*   < > 7.4* 7.3* 7.3* 7.2* 7.6*  MG  --   --  1.9 2.0 1.6*  --  1.8  PHOS 6.5*  --  6.6* 6.5*  --   --  4.6   < > = values in this interval not displayed.   GFR: Estimated Creatinine Clearance: 16.1 mL/min  (A) (by C-G formula based on SCr of 6.1 mg/dL (H)). Liver Function Tests: Recent Labs  Lab 09/24/21 0109 09/25/21 0608 09/26/21 0243 09/29/21 0434  AST  --   --  16  --   ALT  --   --  13  --   ALKPHOS  --   --  76  --   BILITOT  --   --  0.5  --   PROT  --   --  5.7*  --   ALBUMIN 3.1* 3.3*   3.2* 3.0* 2.6*   No results for input(s): LIPASE, AMYLASE in the last 168 hours. No results for input(s): AMMONIA in the last 168 hours. Coagulation Profile: No results for input(s): INR, PROTIME in the last 168 hours. Cardiac Enzymes: No results for input(s): CKTOTAL, CKMB, CKMBINDEX, TROPONINI in the last 168 hours. BNP (last 3 results) No results for input(s): PROBNP in the last 8760 hours. HbA1C: No results for input(s): HGBA1C in the last 72 hours. CBG: Recent Labs  Lab 09/25/21 0539 09/29/21 0856  GLUCAP 78 100*   Lipid Profile: No results for input(s): CHOL, HDL, LDLCALC, TRIG, CHOLHDL, LDLDIRECT in the last 72 hours. Thyroid Function Tests: No results for input(s): TSH, T4TOTAL, FREET4, T3FREE, THYROIDAB in the last 72 hours. Anemia Panel: No results for input(s): VITAMINB12, FOLATE, FERRITIN, TIBC, IRON, RETICCTPCT in the last 72 hours. Sepsis Labs: No results for input(s): PROCALCITON, LATICACIDVEN in the last 168 hours.  Recent Results (from the past 240 hour(s))  Resp Panel by RT-PCR (Flu A&B, Covid) Nasopharyngeal Swab     Status: None   Collection Time: 09/24/21  3:46 PM   Specimen: Nasopharyngeal Swab; Nasopharyngeal(NP) swabs in vial transport medium  Result Value Ref Range Status   SARS Coronavirus 2 by RT PCR NEGATIVE NEGATIVE Final    Comment: (NOTE) SARS-CoV-2 target nucleic acids are NOT DETECTED.  The SARS-CoV-2 RNA is generally detectable in upper respiratory specimens during the acute phase of infection. The lowest concentration of SARS-CoV-2 viral copies this assay can detect is 138 copies/mL. A negative result does not preclude SARS-Cov-2 infection  and should not be used as the sole basis for treatment or other patient management decisions. A negative result may occur with  improper specimen collection/handling, submission of specimen other than nasopharyngeal swab, presence of viral mutation(s) within the areas targeted by this assay, and inadequate number of viral copies(<138 copies/mL). A negative result must be combined with clinical observations, patient history, and  epidemiological information. The expected result is Negative.  Fact Sheet for Patients:  EntrepreneurPulse.com.au  Fact Sheet for Healthcare Providers:  IncredibleEmployment.be  This test is no t yet approved or cleared by the [place] FDA and  has been authorized for detection and/or diagnosis of SARS-CoV-2 by FDA under an Emergency Use Authorization (EUA). This EUA will remain  in effect (meaning this test can be used) for the duration of the COVID-19 declaration under Section 564(b)(1) of the Act, 21 U.S.C.section 360bbb-3(b)(1), unless the authorization is terminated  or revoked sooner.       Influenza A by PCR NEGATIVE NEGATIVE Final   Influenza B by PCR NEGATIVE NEGATIVE Final    Comment: (NOTE) The Xpert Xpress SARS-CoV-2/FLU/RSV plus assay is intended as an aid in the diagnosis of influenza from Nasopharyngeal swab specimens and should not be used as a sole basis for treatment. Nasal washings and aspirates are unacceptable for Xpert Xpress SARS-CoV-2/FLU/RSV testing.  Fact Sheet for Patients: EntrepreneurPulse.com.au  Fact Sheet for Healthcare Providers: IncredibleEmployment.be  This test is not yet approved or cleared by the [place] FDA and has been authorized for detection and/or diagnosis of SARS-CoV-2 by FDA under an Emergency Use Authorization (EUA). This EUA will remain in effect (meaning this test can be used) for the duration of the COVID-19 declaration  under Section 564(b)(1) of the Act, 21 U.S.C. section 360bbb-3(b)(1), unless the authorization is terminated or revoked.  Performed at [facility], [place] [address], [place], [place] [address]        Radiology Studies: No results found.  Scheduled Meds:  amLODipine  10 mg Oral Daily   atorvastatin  40 mg Oral Daily   calcium acetate  667 mg Oral TID WC   Chlorhexidine Gluconate Cloth  6 each Topical Q0600   cloNIDine  0.2 mg Oral TID   [START ON 10/02/2021] darbepoetin (ARANESP) injection - DIALYSIS  60 mcg Intravenous Q Sat-HD   hydrALAZINE  100 mg Oral TID   melatonin  10 mg Oral QHS   sodium chloride flush  10-40 mL Intracatheter Q12H   traZODone  100 mg Oral QHS   Continuous Infusions:  sodium chloride     sodium chloride     sodium chloride Stopped (09/25/21 0050)   ferric gluconate (FERRLECIT) IVPB Stopped (09/28/21 1109)   furosemide 160 mg (09/29/21 1126)     LOS: 4 days   Time spent: 35 minutes   Geradine Girt, DO [facility]  If 7PM-7AM, please contact night-coverage [email or web address] 09/29/2021, 1:51 PM

## 2021-09-29 NOTE — Op Note (Signed)
DATE OF SERVICE: 09/29/2021  PATIENT:  Stanley [name]  45 y.o. male  PRE-OPERATIVE DIAGNOSIS:  ESRD  POST-OPERATIVE DIAGNOSIS:  Same  PROCEDURE:   Left radiocephalic arteriovenous fistula creation  SURGEON:  Surgeon(s) and Role:    * Cherre Robins, MD - Primary  ASSISTANT: Gerri Lins, PA-C  An assistant was required to facilitate exposure and expedite the case.  ANESTHESIA:   regional and MAC  EBL: minimal  BLOOD ADMINISTERED:none  DRAINS: none   LOCAL MEDICATIONS USED:  NONE  SPECIMEN:  none  COUNTS: confirmed correct.  TOURNIQUET:  none  PATIENT DISPOSITION:  PACU - hemodynamically stable.   Delay start of Pharmacological VTE agent (>24hrs) due to surgical blood loss or risk of bleeding: no  INDICATION FOR PROCEDURE: [name] is a 45 y.o. male with new diagnosis of ESRD.  Preoperative vein mapping suggested he did not have adequate vein for fistula creation.  After careful discussion of risks, benefits, and alternatives the patient was offered arteriovenous graft. The patient understood and wished to proceed.  OPERATIVE FINDINGS: Left radial artery and cephalic vein appeared robust and suitable for fistula creation.  I felt this was more appropriate for a 45 year old gentleman and committing him to an arteriovenous graft.  DESCRIPTION OF PROCEDURE: After identification of the patient in the pre-operative holding area, the patient was transferred to the operating room. The patient was positioned supine on the operating room table. Anesthesia was induced. The left arm was prepped and draped in standard fashion. A surgical pause was performed confirming correct patient, procedure, and operative location.  Using intraoperative ultrasound the course of the cephalic vein and radial artery were mapped.  Both appeared adequate for AV fistula creation.  A longitudinal incision was made over the left radial wrist between the two vessels.  The cephalic vein was exposed  circumferentially.  The radial artery was exposed circumferentially.  Silastic Vesseloops were used to encircled the radial artery proximally distally.  The patient was systemically heparinized.  The distal aspect of the cephalic vein was transected.  The distal stump was oversewn with a 2-0 silk.  The proximal cephalic vein was controlled with a bulldog clamp.  The radial artery was clamped proximally distally.  An anterior arteriotomy was made with an 11 blade and extended with Potts scissors.  The cephalic vein was anastomosed to the radial artery end-to-side using continuous running suture of 6-0 Prolene with parachute technique.  Immediately prior to completion the anastomosis was flushed and de-aired.  Clamps were released on the cephalic vein, proximal/distal radial artery.  Hemostasis was achieved.  Doppler machine was used to interrogate the fistula.  Excellent bruit was heard in the fistula.  Biphasic Doppler signal was heard in the radial artery distal to the fistula.  Triphasic signal was heard proximal to the fistula.  The wound was irrigated.  The wound was closed in layers using 3-0 Vicryl and 4-0 Monocryl.  Bandage was applied.  Upon completion of the case instrument and sharps counts were confirmed correct. The patient was transferred to the  PACU in good condition. I was present for all portions of the procedure.  Yevonne Aline. Stanford Breed, MD [facility] Phone Number: [phone] 09/29/2021 8:38 AM

## 2021-09-29 NOTE — Progress Notes (Signed)
Report called to Hessie Dibble CRNA.

## 2021-09-29 NOTE — Anesthesia Procedure Notes (Signed)
Procedure Name: MAC Date/Time: 09/29/2021 7:45 AM Performed by: Janace Litten, CRNA Pre-anesthesia Checklist: Patient identified, Emergency Drugs available, Suction available and Patient being monitored Patient Re-evaluated:Patient Re-evaluated prior to induction Oxygen Delivery Method: Nasal cannula

## 2021-09-29 NOTE — Anesthesia Procedure Notes (Signed)
Anesthesia Regional Block: Supraclavicular block   Pre-Anesthetic Checklist: , timeout performed,  Correct Patient, Correct Site, Correct Laterality,  Correct Procedure, Correct Position, site marked,  Risks and benefits discussed,  Surgical consent,  Pre-op evaluation,  At surgeon's request and post-op pain management  Laterality: Upper and Left  Prep: chloraprep       Needles:  Injection technique: Single-shot  Needle Type: Stimiplex          Additional Needles:   Procedures:,,,, ultrasound used (permanent image in chart),,    Narrative:  Start time: 09/29/2021 7:03 AM End time: 09/29/2021 7:23 AM Injection made incrementally with aspirations every 5 mL.  Performed by: Personally  Anesthesiologist: Nolon Nations, MD  Additional Notes: BP cuff, SpO2 and EKG monitors applied. Sedation begun. Nerve location verified with ultrasound. Anesthetic injected incrementally, slowly, and after neg aspirations under direct u/s guidance. Pt had brief paresthesia. Needle repositioned. Good perineural spread. Tolerated well.

## 2021-09-29 NOTE — Progress Notes (Signed)
[facility] [place] PROGRESS NOTE  ASSESSMENT / PLAN: Stanley [name] is a 45 y.o. male with new diagnosis of ESRD. TDC placed yesterday. Hypoxemia prevented safe placement of AVG. Plan AVG today  SUBJECTIVE: Seen in preop. No complaints.  OBJECTIVE: BP (!) 194/102 (BP Location: Left Arm)    Pulse 70    Temp 98.5 F (36.9 C) (Oral)    Resp 18    Ht 5\' 6"  (1.676 m)    Wt 88.2 kg    SpO2 99%    BMI 31.38 kg/m   Intake/Output Summary (Last 24 hours) at 09/29/2021 0838 Last data filed at 09/28/2021 2100 Gross per 24 hour  Intake 570.26 ml  Output 2884 ml  Net -2313.74 ml    No distress. TDC in place.  Unlabored respirations. Neck soft  CBC Latest Ref Rng & Units 09/29/2021 09/28/2021 09/27/2021  WBC 4.0 - 10.5 K/uL 5.1 5.7 5.7  Hemoglobin 13.0 - 17.0 g/dL 7.5(L) 7.0(L) 7.6(L)  Hematocrit 39.0 - 52.0 % 22.9(L) 21.4(L) 23.1(L)  Platelets 150 - 400 K/uL 228 221 233     CMP Latest Ref Rng & Units 09/29/2021 09/28/2021 09/27/2021  Glucose 70 - 99 mg/dL 91 92 91  BUN 6 - 20 mg/dL 40(H) 61(H) 54(H)  Creatinine 0.61 - 1.24 mg/dL 6.10(H) 7.76(H) 7.08(H)  Sodium 135 - 145 mmol/L 136 139 138  Potassium 3.5 - 5.1 mmol/L 3.9 3.7 3.4(L)  Chloride 98 - 111 mmol/L 101 105 104  CO2 22 - 32 mmol/L 26 22 24   Calcium 8.9 - 10.3 mg/dL 7.6(L) 7.2(L) 7.3(L)  Total Protein 6.5 - 8.1 g/dL - - -  Total Bilirubin 0.3 - 1.2 mg/dL - - -  Alkaline Phos 38 - 126 U/L - - -  AST 15 - 41 U/L - - -  ALT 0 - 44 U/L - - -    Estimated Creatinine Clearance: 16.1 mL/min (A) (by C-G formula based on SCr of 6.1 mg/dL (H)).  Yevonne Aline. Stanford Breed, MD [facility] Phone Number: [phone] 09/29/2021 8:38 AM

## 2021-09-30 ENCOUNTER — Other Ambulatory Visit (HOSPITAL_COMMUNITY): Payer: Self-pay

## 2021-09-30 ENCOUNTER — Encounter (HOSPITAL_COMMUNITY): Payer: Self-pay | Admitting: Vascular Surgery

## 2021-09-30 DIAGNOSIS — Z87891 Personal history of nicotine dependence: Secondary | ICD-10-CM | POA: Insufficient documentation

## 2021-09-30 DIAGNOSIS — Z8673 Personal history of transient ischemic attack (TIA), and cerebral infarction without residual deficits: Secondary | ICD-10-CM | POA: Insufficient documentation

## 2021-09-30 DIAGNOSIS — Z7982 Long term (current) use of aspirin: Secondary | ICD-10-CM | POA: Insufficient documentation

## 2021-09-30 DIAGNOSIS — E7800 Pure hypercholesterolemia, unspecified: Secondary | ICD-10-CM | POA: Insufficient documentation

## 2021-09-30 DIAGNOSIS — I5032 Chronic diastolic (congestive) heart failure: Secondary | ICD-10-CM | POA: Insufficient documentation

## 2021-09-30 DIAGNOSIS — I132 Hypertensive heart and chronic kidney disease with heart failure and with stage 5 chronic kidney disease, or end stage renal disease: Secondary | ICD-10-CM | POA: Insufficient documentation

## 2021-09-30 LAB — CBC WITH DIFFERENTIAL/PLATELET
Abs Immature Granulocytes: 0.04 10*3/uL (ref 0.00–0.07)
Basophils Absolute: 0 10*3/uL (ref 0.0–0.1)
Basophils Relative: 0 %
Eosinophils Absolute: 0.3 10*3/uL (ref 0.0–0.5)
Eosinophils Relative: 4 %
HCT: 23.1 % — ABNORMAL LOW (ref 39.0–52.0)
Hemoglobin: 7.3 g/dL — ABNORMAL LOW (ref 13.0–17.0)
Immature Granulocytes: 1 %
Lymphocytes Relative: 10 %
Lymphs Abs: 0.7 10*3/uL (ref 0.7–4.0)
MCH: 29.4 pg (ref 26.0–34.0)
MCHC: 31.6 g/dL (ref 30.0–36.0)
MCV: 93.1 fL (ref 80.0–100.0)
Monocytes Absolute: 0.6 10*3/uL (ref 0.1–1.0)
Monocytes Relative: 9 %
Neutro Abs: 5.1 10*3/uL (ref 1.7–7.7)
Neutrophils Relative %: 76 %
Platelets: 228 10*3/uL (ref 150–400)
RBC: 2.48 MIL/uL — ABNORMAL LOW (ref 4.22–5.81)
RDW: 15.6 % — ABNORMAL HIGH (ref 11.5–15.5)
WBC: 6.7 10*3/uL (ref 4.0–10.5)
nRBC: 0 % (ref 0.0–0.2)

## 2021-09-30 LAB — RENAL FUNCTION PANEL
Albumin: 2.6 g/dL — ABNORMAL LOW (ref 3.5–5.0)
Anion gap: 10 (ref 5–15)
BUN: 55 mg/dL — ABNORMAL HIGH (ref 6–20)
CO2: 25 mmol/L (ref 22–32)
Calcium: 7.9 mg/dL — ABNORMAL LOW (ref 8.9–10.3)
Chloride: 102 mmol/L (ref 98–111)
Creatinine, Ser: 7.13 mg/dL — ABNORMAL HIGH (ref 0.61–1.24)
GFR, Estimated: 9 mL/min — ABNORMAL LOW (ref >60)
Glucose, Bld: 86 mg/dL (ref 70–99)
Phosphorus: 6.9 mg/dL — ABNORMAL HIGH (ref 2.5–4.6)
Potassium: 4 mmol/L (ref 3.5–5.1)
Sodium: 137 mmol/L (ref 135–145)

## 2021-09-30 LAB — PREPARE RBC (CROSSMATCH)

## 2021-09-30 LAB — PARATHYROID HORMONE, INTACT (NO CA): PTH: 462 pg/mL — ABNORMAL HIGH (ref 15–65)

## 2021-09-30 MED ORDER — CLONIDINE HCL 0.2 MG PO TABS
0.2000 mg | ORAL_TABLET | Freq: Three times a day (TID) | ORAL | 0 refills | Status: DC
  Filled 2021-09-30: qty 90, 30d supply, fill #0

## 2021-09-30 MED ORDER — SODIUM CHLORIDE 0.9% IV SOLUTION: Freq: Once | INTRAVENOUS | Status: AC

## 2021-09-30 NOTE — Progress Notes (Signed)
Pt for d/c today. Unable to reach pt via phone due to working remotely. Spoke to pt's brother, Tishon, via phone. Brother aware pt for d/c today. Reminded brother that pt needs to be at clinic tomorrow at 3:00 to complete paperwork to be able to start on Saturday. Pt's brother voices understanding and to remind pt. Contacted [facility] to advise staff pt to d/c today and to complete paperwork tomorrow. Renal NP aware of pt's d/c and to provide orders to clinic.   Melven Sartorius Renal Navigator [phone]

## 2021-09-30 NOTE — Progress Notes (Signed)
[facility] Progress Note   Subjective:    Seen and examined at bedside on HD. No complaints/concerns. Hgb this AM 7.3. Ordered to give 1 unit PRBC with HD today. Will re-check Hgb at next HD with outpatient. Plan for DC today after HD.  Objective Vitals:   09/30/21 1030 09/30/21 1100 09/30/21 1130 09/30/21 1223  BP: (!) 187/106 (!) 179/103 (!) 178/97 (!) 180/89  Pulse: 70 72 74   Resp: 19 20 16    Temp: 98.7 F (37.1 C)  98 F (36.7 C)   TempSrc: Oral  Oral   SpO2:   100%   Weight:   84 kg   Height:       Physical Exam General: Middle-aged male; NAD Heart: S1 and S2; No murmurs, rales, or rhonchi Lungs: Noted fine crackles mid bilateral lobes/diminished; no wheezing or rhonchi Abdomen: Soft, non-tender, active bowel sounds Extremities: 1+ edema LE BLLE Dialysis Access: TDC placed 09/25/21 by Dr. Wynonia Sours Weights   09/28/21 1222 09/30/21 0733 09/30/21 1130  Weight: 88.2 kg 86.3 kg 84 kg    Intake/Output Summary (Last 24 hours) at 09/30/2021 1231 Last data filed at 09/30/2021 1117 Gross per 24 hour  Intake 558.03 ml  Output 4980 ml  Net -4421.97 ml    Additional Objective Labs: Basic Metabolic Panel: Recent Labs  Lab 09/26/21 0243 09/27/21 1516 09/28/21 0341 09/29/21 0434 09/30/21 0147  NA 140   < > 139 136 137  K 4.6   < > 3.7 3.9 4.0  CL 109   < > 105 101 102  CO2 16*   < > 22 26 25   GLUCOSE 99   < > 92 91 86  BUN 97*   < > 61* 40* 55*  CREATININE 11.10*   < > 7.76* 6.10* 7.13*  CALCIUM 7.3*   < > 7.2* 7.6* 7.9*  PHOS 6.5*  --   --  4.6 6.9*   < > = values in this interval not displayed.   Liver Function Tests: Recent Labs  Lab 09/26/21 0243 09/29/21 0434 09/30/21 0147  AST 16  --   --   ALT 13  --   --   ALKPHOS 76  --   --   BILITOT 0.5  --   --   PROT 5.7*  --   --   ALBUMIN 3.0* 2.6* 2.6*   No results for input(s): LIPASE, AMYLASE in the last 168 hours. CBC: Recent Labs  Lab 09/26/21 0243 09/27/21 1516  09/28/21 0341 09/29/21 0434 09/30/21 0147  WBC 8.2 5.7 5.7 5.1 6.7  NEUTROABS  --   --   --  3.6 5.1  HGB 7.6* 7.6* 7.0* 7.5* 7.3*  HCT 24.5* 23.1* 21.4* 22.9* 23.1*  MCV 93.2 90.6 90.3 90.9 93.1  PLT 250 233 221 228 228   Blood Culture    Component Value Date/Time   SDES URINE, CLEAN CATCH 02/10/2018 1652   SPECREQUEST NONE 02/10/2018 1652   CULT  02/10/2018 1652    NO GROWTH Performed at [facility] [address], [place], [place] [address]    REPTSTATUS 02/11/2018 FINAL 02/10/2018 1652    Cardiac Enzymes: No results for input(s): CKTOTAL, CKMB, CKMBINDEX, TROPONINI in the last 168 hours. CBG: Recent Labs  Lab 09/25/21 0539 09/29/21 0856  GLUCAP 78 100*   Iron Studies: No results for input(s): IRON, TIBC, TRANSFERRIN, FERRITIN in the last 72 hours. Lab Results  Component Value Date   INR 1.1 08/23/2019  Studies/Results: No results found.  Medications:  sodium chloride     sodium chloride     sodium chloride Stopped (09/25/21 0050)   ferric gluconate (FERRLECIT) IVPB 125 mg (09/30/21 1145)   furosemide 160 mg (09/29/21 1817)    amLODipine  10 mg Oral Daily   atorvastatin  40 mg Oral Daily   calcium acetate  667 mg Oral TID WC   Chlorhexidine Gluconate Cloth  6 each Topical Q0600   cloNIDine  0.2 mg Oral TID   [START ON 10/02/2021] darbepoetin (ARANESP) injection - DIALYSIS  60 mcg Intravenous Q Sat-HD   hydrALAZINE  100 mg Oral TID   melatonin  10 mg Oral QHS   sodium chloride flush  10-40 mL Intracatheter Q12H   traZODone  100 mg Oral QHS    Dialysis Orders: CLIP for outpatient HD placement  Assessment/Plan: Acute Hypoxic Respiratory Failure-in setting of volume overload. Please see #2 2.  ESRD -pt new to HD. First session 09/26/21, 2nd session: 09/27/21, and 3rd session 09/28/21. Completed HD today. Removed 2.5L-informed by HD RN patient reported cramping again after treatment today. Still establishing his EDW since he's new to HD. Will dc  lasix.  3. Vascular Access-s/p [facility] placment 12/24 by Dr. Stanford Breed. S/p L AVG placement 09/29/21 by Dr. Stanford Breed 4. Anemia of CKD- Hgb 7.3, s/p 2 units total during hospitalization-last unit given today. Iron studies from 12/23: Iron 56, Tsat 20%, and Ferritin 109-will order Fe series X 10. Continue Aranesp. Will re-check CBC at next HD in outpatient.  5. Secondary hyperparathyroidism - CorrCa 8.0, PTH results pending, will start VDRA in outpatient, continue binders 6. Metabolic Acidosis- Resolved. Pt now on HD. 7. HTN/volume - SBPs coming down slowly while on HD. New to HD-continue to push UFG. Continue Amlodipine, Clonidine, Hydralazine. 8. Nutrition - Continue renal diet on fluid restriction 9. Disposition: Patient will go to [facility] on TTS. He will need to sign paperwork at [facility] tomorrow then will start outpatient HD on Saturday 10/02/21 (TTS schedule). Okay for discharge from renal standpoint.  Stanley Poet, NP [facility] 09/30/2021,12:31 PM  LOS: 5 days

## 2021-09-30 NOTE — Progress Notes (Addendum)
[facility] [place]  Subjective  - No new complaints   Objective (!) 162/81 80 98.4 F (36.9 C) (Oral) 18 100%  Intake/Output Summary (Last 24 hours) at 09/30/2021 0708 Last data filed at 09/30/2021 8110 Gross per 24 hour  Intake 508.03 ml  Output 2490 ml  Net -1981.97 ml    Left forearm fistula with weak palpable thrill Incision healing well without hematoma Left hand N/V/M intact Right TDC in place  Assessment/Planning: POD # 1 left RC AV fistula creation  Activity as tolerates with left UE.  I ask him to exercise the left hand to help with maturity of fistula. F/U in 4-6 weeks with fistula duplex office will schedule apptointment  Roxy Horseman 09/30/2021 7:08 AM --  Laboratory Lab Results: Recent Labs    09/29/21 0434 09/30/21 0147  WBC 5.1 6.7  HGB 7.5* 7.3*  HCT 22.9* 23.1*  PLT 228 228   BMET Recent Labs    09/29/21 0434 09/30/21 0147  NA 136 137  K 3.9 4.0  CL 101 102  CO2 26 25  GLUCOSE 91 86  BUN 40* 55*  CREATININE 6.10* 7.13*  CALCIUM 7.6* 7.9*    COAG Lab Results  Component Value Date   INR 1.1 08/23/2019   No results found for: PTT  VASCULAR STAFF ADDENDUM: I have independently interviewed and examined the patient. I agree with the above.  Reviewed operative details. Good thrill in L wrist AVF. Follow up with VVS PA in 4 weeks with AVF duplex. Please call for questions.  Yevonne Aline. Stanford Breed, MD [facility] Phone Number: [phone] 09/30/2021 10:28 AM

## 2021-09-30 NOTE — Discharge Summary (Signed)
Physician Discharge Summary  Stanley [name] ION:[id number] DOB: [birth year] DOA: 09/24/2021  PCP: Elsie Stain, MD  Admit date: 09/24/2021 Discharge date: 09/30/2021  Admitted From: home Discharge disposition: home   Recommendations for Outpatient Follow-Up:   HD outpatient  CBC 1 week  Discharge Diagnosis:   Principal Problem:   ESRD (end stage renal disease) ([place]) Active Problems:   Hypertensive emergency   Volume overload   Metabolic acidosis   Hyperkalemia    Discharge Condition: Improved.  Diet recommendation: renal  Wound care: None.  Code status: Full.   History of Present Illness:   Stanley [name] is a 45 y.o. male with medical history significant of chronic malignant hypertension, medical noncompliance, CKD stage V, OSA, chronic diastolic CHF.  He has had deterioration of his renal function and sent to the ED by his PCP/nephrology to be started on dialysis.  Patient complained of shortness of breath, chest pain, and bilateral lower extremity edema.  In the ED, not febrile or tachycardic.  Not hypoxic.  Blood pressure significantly elevated with systolic in the 244W and diastolic in the 102V.  Labs showing no leukocytosis.  Hemoglobin 6.3, was previously in the 8-10 range.  Potassium 5.4.  Bicarb 13.  BUN 94, creatinine 10.9, GFR 5.  Creatinine was 6.8 in February 2022 and 9.0 in June 2022.  Calcium 7.2.  High-sensitivity troponin 34 >35.  BNP 2528.  COVID and influenza PCR negative.  Chest x-ray showing pulmonary edema.  Patient does not have dialysis access.  Nephrology and vascular surgery consulted.  Plan for initiation of dialysis after placement of left upper extremity AV graft and tunneled dialysis catheter in the morning.  Nephrology recommended starting IV Lasix 120 mg twice daily until stable on HD.  He was given [facility] for hyperkalemia and also expected to improve with Lasix.  1 unit PRBCs given for anemia.  Started on bicarb supplementation for  metabolic acidosis.  Blood pressure continued to be significantly elevated with systolic in the 253G despite administration of multiple medications including hydralazine, clonidine, and labetalol.  ED physician discussed the case with PCCM MD who did not feel that the patient needed ICU admission, recommended starting diltiazem drip.  Patient continued to be hypertensive in the ED as diltiazem drip was not initiated for several hours.  Finally after diltiazem drip was initiated, there was slight improvement in blood pressure with systolic coming down below 200.   Patient states he was told by his nephrologist a year ago that he had to be started on dialysis but he was hesitant at that time.  This past week he has had increasing shortness of breath and swelling of his legs despite taking Lasix.  He is making urine.  He spoke to his primary care physician and nephrologist and they both suggested that he come into the hospital to be started on dialysis.  States he had some left-sided chest pain when he initially came into the emergency room but it has now resolved.  States he is taking all of his home blood pressure medications and has not missed any doses.  No other complaints.   Hospital Course by Problem:   Acute Hypoxic Respiratory Failure- -resolved   ESRD -pt new to HD. First session 09/26/21, 2nd session: 09/27/21, and 3rd session 09/28/21. Completed HD 12/29.   Removed 2.5L-informed by HD RN patient reported cramping again after treatment today. Still establishing his EDW since he's new to HD.  Vascular Access-s/p [facility] placment  12/24 by Dr. Stanford Breed. S/p L AVG placement 09/29/21 by Dr. Stanford Breed  Anemia of CKD- Hgb 7.3, s/p 2 units total during hospitalization-last unit given today. Iron studies from 12/23: Iron 56, Tsat 20%, and Ferritin 109-will order Fe series X 10. Continue Aranesp.  -CBC to be checked at next HD in outpatient.   Metabolic Acidosis- Resolved. Pt now on HD.  HTN/volume - SBPs  coming down slowly while on HD. New to HD-continue to push UFG. Continue meds  Nutrition - Continue renal diet on fluid restriction  Disposition: Patient will go to [facility] on TTS. He will need to sign paperwork at [facility] tomorrow then will start outpatient HD on Saturday 10/02/21 (TTS schedule).     Medical Consultants:   Renal vascular   Discharge Exam:   Vitals:   09/30/21 1223 09/30/21 1612  BP: (!) 180/89 (!) 165/90  Pulse:  71  Resp:  17  Temp:  99.1 F (37.3 C)  SpO2:  100%   Vitals:   09/30/21 1100 09/30/21 1130 09/30/21 1223 09/30/21 1612  BP: (!) 179/103 (!) 178/97 (!) 180/89 (!) 165/90  Pulse: 72 74  71  Resp: 20 16  17   Temp:  98 F (36.7 C)  99.1 F (37.3 C)  TempSrc:  Oral  Oral  SpO2:  100%  100%  Weight:  84 kg    Height:        General exam: Appears calm and comfortable.     The results of significant diagnostics from this hospitalization (including imaging, microbiology, ancillary and laboratory) are listed below for reference.     Procedures and Diagnostic Studies:   DG Chest 1 View  Result Date: 09/25/2021 CLINICAL DATA:  45 year old male central venous catheter. EXAM: CHEST  1 VIEW COMPARISON:  09/24/2021 radiographs and earlier. FINDINGS: Portable AP upright view at 0923 hours. Right chest IJ approach dual lumen dialysis type catheter in place with tips at the lower SVC level below the carina. No pneumothorax. Stable cardiac size and mediastinal contours. Visualized tracheal air column is within normal limits. Worsening bibasilar ventilation with new obscuration of the left hemidiaphragm and patchy increased right lung base opacity. Stable pulmonary vascular congestion, possible mild interstitial edema. No obvious pleural effusion. Paucity of bowel gas in the upper abdomen. No acute osseous abnormality identified. IMPRESSION: 1. Right chest approach dual lumen dialysis type catheter placed with no adverse features. 2. Stable pulmonary vascular  congestion with worsening bibasilar ventilation, including left lower lobe collapse or consolidation since yesterday. Electronically Signed   By: Genevie Ann M.D.   On: 09/25/2021 10:00   DG Chest 2 View  Result Date: 09/24/2021 CLINICAL DATA:  Chest pain. EXAM: CHEST - 2 VIEW COMPARISON:  11/09/2020 FINDINGS: Two-view exam shows subtle alveolar opacity in the mid and lower lungs bilaterally without substantial pleural effusion. The cardio pericardial silhouette is enlarged. The visualized bony structures of the thorax show no acute abnormality. IMPRESSION: Subtle alveolar opacity in the mid and lower lungs bilaterally suggesting edema. Electronically Signed   By: Misty Stanley M.D.   On: 09/24/2021 12:58   DG Chest Special View  Result Date: 09/25/2021 CLINICAL DATA:  Dialysis catheter placement EXAM: CHEST SPECIAL VIEW COMPARISON:  09/24/2021 FLUOROSCOPY TIME:  0 minutes 20 seconds Dose: 4.93 mGy Images: 1 FINDINGS: RIGHT jugular dual-lumen central venous catheter with tip projecting over SVC. Visualized osseous structures unremarkable. IMPRESSION: Tip of dual lumen RIGHT jugular central venous catheter tip projects over SVC. Electronically Signed   By: Elta Guadeloupe  Thornton Papas M.D.   On: 09/25/2021 10:22   DG C-Arm 1-60 Min-No Report  Result Date: 09/25/2021 Fluoroscopy was utilized by the requesting physician.  No radiographic interpretation.     Labs:   Basic Metabolic Panel: Recent Labs  Lab 09/25/21 0608 09/26/21 0243 09/27/21 1516 09/28/21 0341 09/29/21 0434 09/30/21 0147  NA 142 140 138 139 136 137  K 4.4 4.6 3.4* 3.7 3.9 4.0  CL 113* 109 104 105 101 102  CO2 15* 16* 24 22 26 25   GLUCOSE 81 99 91 92 91 86  BUN 94* 97* 54* 61* 40* 55*  CREATININE 10.93* 11.10* 7.08* 7.76* 6.10* 7.13*  CALCIUM 7.4* 7.3* 7.3* 7.2* 7.6* 7.9*  MG 1.9 2.0 1.6*  --  1.8  --   PHOS 6.6* 6.5*  --   --  4.6 6.9*   GFR Estimated Creatinine Clearance: 13.4 mL/min (A) (by C-G formula based on SCr of 7.13 mg/dL  (H)). Liver Function Tests: Recent Labs  Lab 09/25/21 0608 09/26/21 0243 09/29/21 0434 09/30/21 0147  AST  --  16  --   --   ALT  --  13  --   --   ALKPHOS  --  76  --   --   BILITOT  --  0.5  --   --   PROT  --  5.7*  --   --   ALBUMIN 3.3*   3.2* 3.0* 2.6* 2.6*   No results for input(s): LIPASE, AMYLASE in the last 168 hours. No results for input(s): AMMONIA in the last 168 hours. Coagulation profile No results for input(s): INR, PROTIME in the last 168 hours.  CBC: Recent Labs  Lab 09/26/21 0243 09/27/21 1516 09/28/21 0341 09/29/21 0434 09/30/21 0147  WBC 8.2 5.7 5.7 5.1 6.7  NEUTROABS  --   --   --  3.6 5.1  HGB 7.6* 7.6* 7.0* 7.5* 7.3*  HCT 24.5* 23.1* 21.4* 22.9* 23.1*  MCV 93.2 90.6 90.3 90.9 93.1  PLT 250 233 221 228 228   Cardiac Enzymes: No results for input(s): CKTOTAL, CKMB, CKMBINDEX, TROPONINI in the last 168 hours. BNP: Invalid input(s): POCBNP CBG: Recent Labs  Lab 09/25/21 0539 09/29/21 0856  GLUCAP 78 100*   D-Dimer No results for input(s): DDIMER in the last 72 hours. Hgb A1c No results for input(s): HGBA1C in the last 72 hours. Lipid Profile No results for input(s): CHOL, HDL, LDLCALC, TRIG, CHOLHDL, LDLDIRECT in the last 72 hours. Thyroid function studies No results for input(s): TSH, T4TOTAL, T3FREE, THYROIDAB in the last 72 hours.  Invalid input(s): FREET3 Anemia work up No results for input(s): VITAMINB12, FOLATE, FERRITIN, TIBC, IRON, RETICCTPCT in the last 72 hours. Microbiology Recent Results (from the past 240 hour(s))  Resp Panel by RT-PCR (Flu A&B, Covid) Nasopharyngeal Swab     Status: None   Collection Time: 09/24/21  3:46 PM   Specimen: Nasopharyngeal Swab; Nasopharyngeal(NP) swabs in vial transport medium  Result Value Ref Range Status   SARS Coronavirus 2 by RT PCR NEGATIVE NEGATIVE Final    Comment: (NOTE) SARS-CoV-2 target nucleic acids are NOT DETECTED.  The SARS-CoV-2 RNA is generally detectable in upper  respiratory specimens during the acute phase of infection. The lowest concentration of SARS-CoV-2 viral copies this assay can detect is 138 copies/mL. A negative result does not preclude SARS-Cov-2 infection and should not be used as the sole basis for treatment or other patient management decisions. A negative result may occur with  improper specimen collection/handling, submission  of specimen other than nasopharyngeal swab, presence of viral mutation(s) within the areas targeted by this assay, and inadequate number of viral copies(<138 copies/mL). A negative result must be combined with clinical observations, patient history, and epidemiological information. The expected result is Negative.  Fact Sheet for Patients:  EntrepreneurPulse.com.au  Fact Sheet for Healthcare Providers:  IncredibleEmployment.be  This test is no t yet approved or cleared by the [place] FDA and  has been authorized for detection and/or diagnosis of SARS-CoV-2 by FDA under an Emergency Use Authorization (EUA). This EUA will remain  in effect (meaning this test can be used) for the duration of the COVID-19 declaration under Section 564(b)(1) of the Act, 21 U.S.C.section 360bbb-3(b)(1), unless the authorization is terminated  or revoked sooner.       Influenza A by PCR NEGATIVE NEGATIVE Final   Influenza B by PCR NEGATIVE NEGATIVE Final    Comment: (NOTE) The Xpert Xpress SARS-CoV-2/FLU/RSV plus assay is intended as an aid in the diagnosis of influenza from Nasopharyngeal swab specimens and should not be used as a sole basis for treatment. Nasal washings and aspirates are unacceptable for Xpert Xpress SARS-CoV-2/FLU/RSV testing.  Fact Sheet for Patients: EntrepreneurPulse.com.au  Fact Sheet for Healthcare Providers: IncredibleEmployment.be  This test is not yet approved or cleared by the [place] FDA and has been  authorized for detection and/or diagnosis of SARS-CoV-2 by FDA under an Emergency Use Authorization (EUA). This EUA will remain in effect (meaning this test can be used) for the duration of the COVID-19 declaration under Section 564(b)(1) of the Act, 21 U.S.C. section 360bbb-3(b)(1), unless the authorization is terminated or revoked.  Performed at [facility], [place] [address] [place], [place] [address]      Discharge Instructions:   Discharge Instructions     Discharge instructions   Complete by: As directed    Renal diet Per vascular: Activity as tolerates with left UE.  I ask him to exercise the left hand to help with maturity of fistula. F/U in 4-6 weeks with fistula duplex office will schedule apptointment   Increase activity slowly   Complete by: As directed    No wound care   Complete by: As directed       Allergies as of 09/30/2021       Reactions   Lactose Intolerance (gi) Nausea And Vomiting   Nitroglycerin Anxiety, Other (See Comments)   Pain and feels like he is on fire   Other Swelling   All beans   Isosorbide Itching   headaches        Medication List     STOP taking these medications    aspirin EC 81 MG tablet   doxazosin 8 MG tablet Commonly known as: CARDURA   furosemide 80 MG tablet Commonly known as: LASIX   sodium bicarbonate 650 MG tablet       TAKE these medications    acetaminophen 500 MG tablet Commonly known as: TYLENOL Take 1,000 mg by mouth as needed for mild pain.   albuterol 108 (90 Base) MCG/ACT inhaler Commonly known as: ProAir HFA Inhale 1-2 puffs into the lungs every 6 (six) hours as needed for wheezing or shortness of breath. What changed: when to take this   amLODipine 10 MG tablet Commonly known as: NORVASC Take 1 tablet (10 mg total) by mouth daily.   atorvastatin 40 MG tablet Commonly known as: LIPITOR Take 1 tablet (40 mg total) by mouth daily.   calcium acetate 667 MG  tablet Commonly known  as: PHOSLO Take 1 tablet (667 mg total) by mouth 3 (three) times daily with meals.   cloNIDine 0.2 MG tablet Commonly known as: CATAPRES Take 1 tablet (0.2 mg total) by mouth 3 (three) times daily. What changed:  medication strength how much to take when to take this   folic acid 003 MCG tablet Commonly known as: FOLVITE Take 0.5 tablets (400 mcg total) by mouth daily.   labetalol 200 MG tablet Commonly known as: NORMODYNE TAKE 3 TABLETS BY MOUTH THREE TIMES DAILY   Melatonin 10 MG Caps Take 1 tablet by mouth daily.   multivitamin with minerals Tabs tablet Take 1 tablet by mouth daily.   ondansetron 4 MG disintegrating tablet Commonly known as: Zofran ODT Take 1 tablet (4 mg total) by mouth every 8 (eight) hours as needed for nausea or vomiting.        [facility] Follow up.   Why: Schedule is Tuesday, Thursday, Saturday. Arrive at 6:20 am for 6:40 am chair time.  Complete paperwork at clinic on Friday, Dec.30 at 3:00.  Patient will start at clinic on Saturday. Contact information: [address] [place] [address] [phone]         Cherre Robins, MD Follow up in 5 week(s).   Specialties: Vascular Surgery, Interventional Cardiology Why: Office will call you to arrange your appt (sent) Contact information: [place] [address] [phone]         Elsie Stain, MD Follow up in 1 week(s).   Specialty: Pulmonary Disease Contact information: [address] [address] [phone]         Jerline Pain, MD .   Specialty: Cardiology Contact information: [phone] [address] [place] [address] [phone]                  Time coordinating discharge: 35 min  Signed:  Geradine Girt DO  [facility] 10/01/2021, 3:23 PM

## 2021-09-30 NOTE — Progress Notes (Signed)
Discharge instructions gone over and follow up information clarified. All questions answered and resolved.   Judson Roch Johngabriel Verde, RN 12.29.22

## 2021-10-01 ENCOUNTER — Other Ambulatory Visit: Payer: Self-pay | Admitting: Cardiology

## 2021-10-01 ENCOUNTER — Other Ambulatory Visit: Payer: Self-pay | Admitting: Critical Care Medicine

## 2021-10-01 ENCOUNTER — Telehealth: Payer: Self-pay

## 2021-10-01 DIAGNOSIS — I10 Essential (primary) hypertension: Secondary | ICD-10-CM

## 2021-10-01 LAB — TYPE AND SCREEN
ABO/RH(D): O POS
Antibody Screen: NEGATIVE
Unit division: 0

## 2021-10-01 LAB — BPAM RBC
Blood Product Expiration Date: 202301032359
ISSUE DATE / TIME: 202212291043
Unit Type and Rh: 9500

## 2021-10-01 NOTE — Telephone Encounter (Signed)
Transition Care Management Unsuccessful Follow-up Telephone Call  Date of discharge and from where:  [facility] on 09/30/2021  Attempts:  1st Attempt  Reason for unsuccessful TCM follow-up call:  Left voice message at [phone] . Call back requested.  Pt has appt scheduled with Dr Joya Gaskins on 02/22/ 2022. Need to schedule a sooner appt.

## 2021-10-01 NOTE — Telephone Encounter (Signed)
Requested Prescriptions  Pending Prescriptions Disp Refills   traZODone (DESYREL) 100 MG tablet [Pharmacy Med Name: traZODone HCl 100 MG Oral Tablet] 30 tablet 0    Sig: TAKE 1 TABLET BY MOUTH AT BEDTIME     Psychiatry: Antidepressants - Serotonin Modulator Passed - 10/01/2021 11:06 AM      Passed - Valid encounter within last 6 months    Recent Outpatient Visits          1 week ago CKD (chronic kidney disease) stage 5, GFR less than 15 ml/min [facility])   [place] Elsie Stain, MD   6 months ago Uncontrolled hypertension   [place] Elsie Stain, MD   4 years ago Cough   [place], [place], [place]   4 years ago Essential hypertension   [place], R[facility]   4 years ago Essential hypertension   [place], [place]      Future Appointments            In 1 month Joya Gaskins Burnett Harry, MD [place]

## 2021-10-02 ENCOUNTER — Telehealth: Payer: Self-pay | Admitting: Physician Assistant

## 2021-10-02 NOTE — Telephone Encounter (Signed)
Transition of care contact from inpatient facility  Date of Discharge: 09/30/21 Date of Contact: 10/02/21 Method of contact: Phone  Attempted to contact patient to discuss transition of care from inpatient admission. Patient did not answer the phone. Message was left on the patient's voicemail with call back number [phone]  Anice Paganini, PA-C 10/02/2021, 11:05 AM  [facility] Pager: [phone]

## 2021-10-05 ENCOUNTER — Telehealth: Payer: Self-pay

## 2021-10-05 DIAGNOSIS — T829XXA Unspecified complication of cardiac and vascular prosthetic device, implant and graft, initial encounter: Secondary | ICD-10-CM | POA: Insufficient documentation

## 2021-10-05 NOTE — Telephone Encounter (Signed)
Transition Care Management Unsuccessful Follow-up Telephone Call  Date of discharge and from where:  12/20222, [facility]   Attempts:  2nd Attempt  Reason for unsuccessful TCM follow-up call:  Left voice message # [phone]. Call back requested to this CM. Need to schedule hospital follow up appointment with PCP at [facility]

## 2021-10-06 ENCOUNTER — Telehealth: Payer: Self-pay

## 2021-10-06 NOTE — Telephone Encounter (Signed)
Transition Care Management Follow-up Telephone Call Date of discharge and from where: 09/30/2021, [facility]  How have you been since you were released from the hospital? He said he is doing fine.  Any questions or concerns? No  Items Reviewed: Did the pt receive and understand the discharge instructions provided? Yes  Medications obtained and verified?  He said that he has all of his medications and his family helps to manage his med regime. He was not aware of all of the medications he is to stop taking. Instructed him to have family review the AVS with the documented changed and discontinued medications.  Also instructed him to call this office with any questions. Re-enforced with him the importance of reviewing this medication list.  Other? No  Any new allergies since your discharge? No  Do you have support at home? Yes   Home Care and Equipment/Supplies: Were home health services ordered? no If so, what is the name of the agency? N/a  Has the agency set up a time to come to the patient's home? not applicable Were any new equipment or medical supplies ordered?  No What is the name of the medical supply agency? N/a Were you able to get the supplies/equipment? not applicable Do you have any questions related to the use of the equipment or supplies? No  He just started dialysis at [facility] T/T/S  Functional Questionnaire: (I = Independent and D = Dependent) ADLs: independent.  His family assists with medication management   Follow up appointments reviewed:  PCP Hospital f/u appt confirmed? Yes  Scheduled to see Dr Joya Gaskins on 10/25/2021. [facility] f/u appt confirmed? Yes  Scheduled to see [facility]- 11/02/2021. Are transportation arrangements needed? No  If their condition worsens, is the pt aware to call PCP or go to the Emergency Dept.? Yes Was the patient provided with contact information for the PCP's office or ED? Yes Was to pt encouraged to call back  with questions or concerns? Yes

## 2021-10-15 DIAGNOSIS — D509 Iron deficiency anemia, unspecified: Secondary | ICD-10-CM | POA: Insufficient documentation

## 2021-10-19 ENCOUNTER — Other Ambulatory Visit: Payer: Self-pay

## 2021-10-19 DIAGNOSIS — N185 Chronic kidney disease, stage 5: Secondary | ICD-10-CM

## 2021-10-24 NOTE — Progress Notes (Incomplete)
Established Patient Office Visit  Subjective:  Patient ID: Stanley [name], male    DOB: 10/16/1975  Age: 46 y.o. MRN: [id number]  CC: No chief complaint on file.   HPI Stanley [name] presents for  Now on HD TTS    Admit date: 09/24/2021 Discharge date: 09/30/2021   Admitted From: home Discharge disposition: home     Recommendations for Outpatient Follow-Up:    HD outpatient  CBC 1 week   Discharge Diagnosis:    Principal Problem:   ESRD (end stage renal disease) ([place]) Active Problems:   Hypertensive emergency   Volume overload   Metabolic acidosis   Hyperkalemia       Discharge Condition: Improved.   Diet recommendation: renal   Wound care: None.   Code status: Full.     History of Present Illness:    Stanley [name] is a 46 y.o. male with medical history significant of chronic malignant hypertension, medical noncompliance, CKD stage V, OSA, chronic diastolic CHF.  He has had deterioration of his renal function and sent to the ED by his PCP/nephrology to be started on dialysis.  Patient complained of shortness of breath, chest pain, and bilateral lower extremity edema.  In the ED, not febrile or tachycardic.  Not hypoxic.  Blood pressure significantly elevated with systolic in the 509T and diastolic in the 267T.  Labs showing no leukocytosis.  Hemoglobin 6.3, was previously in the 8-10 range.  Potassium 5.4.  Bicarb 13.  BUN 94, creatinine 10.9, GFR 5.  Creatinine was 6.8 in February 2022 and 9.0 in June 2022.  Calcium 7.2.  High-sensitivity troponin 34 >35.  BNP 2528.  COVID and influenza PCR negative.  Chest x-ray showing pulmonary edema.  Patient does not have dialysis access.  Nephrology and vascular surgery consulted.  Plan for initiation of dialysis after placement of left upper extremity AV graft and tunneled dialysis catheter in the morning.  Nephrology recommended starting IV Lasix 120 mg twice daily until stable on HD.  He was given [facility] for hyperkalemia and also  expected to improve with Lasix.  1 unit PRBCs given for anemia.  Started on bicarb supplementation for metabolic acidosis.  Blood pressure continued to be significantly elevated with systolic in the 245Y despite administration of multiple medications including hydralazine, clonidine, and labetalol.  ED physician discussed the case with PCCM MD who did not feel that the patient needed ICU admission, recommended starting diltiazem drip.  Patient continued to be hypertensive in the ED as diltiazem drip was not initiated for several hours.  Finally after diltiazem drip was initiated, there was slight improvement in blood pressure with systolic coming down below 200.   Patient states he was told by his nephrologist a year ago that he had to be started on dialysis but he was hesitant at that time.  This past week he has had increasing shortness of breath and swelling of his legs despite taking Lasix.  He is making urine.  He spoke to his primary care physician and nephrologist and they both suggested that he come into the hospital to be started on dialysis.  States he had some left-sided chest pain when he initially came into the emergency room but it has now resolved.  States he is taking all of his home blood pressure medications and has not missed any doses.  No other complaints.     Hospital Course by Problem:    Acute Hypoxic Respiratory Failure- -resolved   ESRD -pt new  to HD. First session 09/26/21, 2nd session: 09/27/21, and 3rd session 09/28/21. Completed HD 12/29.   Removed 2.5L-informed by HD RN patient reported cramping again after treatment today. Still establishing his EDW since he's new to HD.   Vascular Access-s/p [facility] placment 12/24 by Dr. Stanford Breed. S/p L AVG placement 09/29/21 by Dr. Stanford Breed   Anemia of CKD- Hgb 7.3, s/p 2 units total during hospitalization-last unit given today. Iron studies from 12/23: Iron 56, Tsat 20%, and Ferritin 109-will order Fe series X 10. Continue Aranesp.  -CBC to  be checked at next HD in outpatient.    Metabolic Acidosis- Resolved. Pt now on HD.   HTN/volume - SBPs coming down slowly while on HD. New to HD-continue to push UFG. Continue meds   Nutrition - Continue renal diet on fluid restriction   Disposition: Patient will go to [facility] on TTS. He will need to sign paperwork at [facility] tomorrow then will start outpatient HD on Saturday 10/02/21 (TTS schedule).    Past Medical History:  Diagnosis Date   Anxiety    Chronic kidney disease (CKD), stage V ([place]) 11/2020   CKD (chronic kidney disease), stage IV (HCC)    Depression    Dyslexia    Dyspnea    Heart palpitations    High cholesterol    Hypertension    Hypertensive urgency    Polysubstance (including opioids) dependence, binge pattern ([place]) 08/2020   Stroke ([place]) 06/03/2017   "minor; completely recovered today" (06/13/2017)   Tobacco abuse     Past Surgical History:  Procedure Laterality Date   AV FISTULA PLACEMENT Left 09/29/2021   Procedure: LEFT RADIOCEPHALIC ARTERIOVENOUS (AV) FISTULA CREATION;  Surgeon: Cherre Robins, MD;  Location: [facility] OR;  Service: Vascular;  Laterality: Left;   INSERTION OF DIALYSIS CATHETER Right 09/25/2021   Procedure: INSERTION OF 19 sm PALIDROME PRECISION CHRONIC CATHETER;  Surgeon: Cherre Robins, MD;  Location: [facility] OR;  Service: Vascular;  Laterality: Right;   NO PAST SURGERIES      Family History  Problem Relation Age of Onset   Hypertension Mother    Hypertension Brother    Diabetes type II Brother     Social History   Socioeconomic History   Marital status: Married    Spouse name: Not on file   Number of children: Not on file   Years of education: Not on file   Highest education level: Not on file  Occupational History   Not on file  Tobacco Use   Smoking status: Former    Packs/day: 1.00    Years: 30.00    Pack years: 30.00    Types: Cigarettes   Smokeless tobacco: Never  Vaping Use   Vaping Use: Never  used  Substance and Sexual Activity   Alcohol use: No   Drug use: Yes    Types: Marijuana    Comment: 06/13/2017 "not often"   Sexual activity: Not Currently  Other Topics Concern   Not on file  Social History Narrative   Not on file   Social Determinants of Health   Financial Resource Strain: Not on file  Food Insecurity: Not on file  Transportation Needs: Not on file  Physical Activity: Not on file  Stress: Not on file  Social Connections: Not on file  Intimate Partner Violence: Not on file    Outpatient Medications Prior to Visit  Medication Sig Dispense Refill   acetaminophen (TYLENOL) 500 MG tablet Take 1,000 mg by mouth as needed for mild  pain.     albuterol (PROAIR HFA) 108 (90 Base) MCG/ACT inhaler Inhale 1-2 puffs into the lungs every 6 (six) hours as needed for wheezing or shortness of breath. (Patient taking differently: Inhale 1-2 puffs into the lungs as needed for wheezing or shortness of breath.) 18 g 11   amLODipine (NORVASC) 10 MG tablet Take 1 tablet (10 mg total) by mouth daily. 90 tablet 3   atorvastatin (LIPITOR) 40 MG tablet Take 1 tablet (40 mg total) by mouth daily. 30 tablet 11   calcium acetate (PHOSLO) 667 MG tablet Take 1 tablet (667 mg total) by mouth 3 (three) times daily with meals. 90 tablet 4   cloNIDine (CATAPRES) 0.2 MG tablet Take 1 tablet (0.2 mg total) by mouth 3 (three) times daily. 90 tablet 0   folic acid (FOLVITE) 762 MCG tablet Take 0.5 tablets (400 mcg total) by mouth daily. 30 tablet 11   hydrALAZINE (APRESOLINE) 100 MG tablet TAKE 1 TABLET BY MOUTH THREE TIMES DAILY . APPOINTMENT REQUIRED FOR FUTURE REFILLS 90 tablet 0   labetalol (NORMODYNE) 200 MG tablet TAKE 3 TABLETS BY MOUTH THREE TIMES DAILY 270 tablet 0   Melatonin 10 MG CAPS Take 1 tablet by mouth daily.     Multiple Vitamin (MULTIVITAMIN WITH MINERALS) TABS tablet Take 1 tablet by mouth daily.     ondansetron (ZOFRAN ODT) 4 MG disintegrating tablet Take 1 tablet  (4 mg total) by mouth every 8 (eight) hours as needed for nausea or vomiting. 30 tablet 3   traZODone (DESYREL) 100 MG tablet TAKE 1 TABLET BY MOUTH AT BEDTIME 90 tablet 0   No facility-administered medications prior to visit.    Allergies  Allergen Reactions   Lactose Intolerance (Gi) Nausea And Vomiting   Nitroglycerin Anxiety and Other (See Comments)    Pain and feels like he is on fire   Other Swelling    All beans   Isosorbide Itching    headaches    ROS Review of Systems    Objective:    Physical Exam  There were no vitals taken for this visit. Wt Readings from Last 3 Encounters:  09/30/21 185 lb 3 oz (84 kg)  09/23/21 221 lb 6.4 oz (100.4 kg)  09/10/21 216 lb (98 kg)     Health Maintenance Due  Topic Date Due   COVID-19 Vaccine (1) Never done   TETANUS/TDAP  Never done   INFLUENZA VACCINE  05/03/2021    There are no preventive care reminders to display for this patient.  Lab Results  Component Value Date   TSH 1.310 03/01/2018   Lab Results  Component Value Date   WBC 6.7 09/30/2021   HGB 7.3 (L) 09/30/2021   HCT 23.1 (L) 09/30/2021   MCV 93.1 09/30/2021   PLT 228 09/30/2021   Lab Results  Component Value Date   NA 137 09/30/2021   K 4.0 09/30/2021   CO2 25 09/30/2021   GLUCOSE 86 09/30/2021   BUN 55 (H) 09/30/2021   CREATININE 7.13 (H) 09/30/2021   BILITOT 0.5 09/26/2021   ALKPHOS 76 09/26/2021   AST 16 09/26/2021   ALT 13 09/26/2021   PROT 5.7 (L) 09/26/2021   ALBUMIN 2.6 (L) 09/30/2021   CALCIUM 7.9 (L) 09/30/2021   ANIONGAP 10 09/30/2021   EGFR 7 (L) 03/29/2021   Lab Results  Component Value Date   CHOL 76 (L) 03/29/2021   Lab Results  Component Value Date   HDL 40 03/29/2021   Lab Results  Component Value Date   LDLCALC 25 03/29/2021   Lab Results  Component Value Date   TRIG 37 03/29/2021   Lab Results  Component Value Date   CHOLHDL 1.9 03/29/2021   Lab Results  Component Value Date   HGBA1C 5.0  07/10/2020   HGBA1C 5.0 07/10/2020   HGBA1C 5.0 (A) 07/10/2020   HGBA1C 5.0 07/10/2020      Assessment & Plan:   Problem List Items Addressed This Visit   None   No orders of the defined types were placed in this encounter.   Follow-up: No follow-ups on file.    Asencion Noble, MD

## 2021-10-25 ENCOUNTER — Ambulatory Visit: Payer: Medicaid Other | Admitting: Critical Care Medicine

## 2021-11-02 ENCOUNTER — Inpatient Hospital Stay (HOSPITAL_COMMUNITY): Admit: 2021-11-02 | Payer: Medicaid Other

## 2021-11-07 ENCOUNTER — Other Ambulatory Visit: Payer: Self-pay | Admitting: Cardiology

## 2021-11-07 DIAGNOSIS — I10 Essential (primary) hypertension: Secondary | ICD-10-CM

## 2021-11-23 ENCOUNTER — Ambulatory Visit: Payer: Self-pay

## 2021-11-23 NOTE — Telephone Encounter (Signed)
Please advise 

## 2021-11-23 NOTE — Telephone Encounter (Signed)
Called and left a vm to follow up

## 2021-11-23 NOTE — Telephone Encounter (Signed)
°  Chief Complaint: High BP at Dialysis Symptoms: 200/100 Frequency: unknown Pertinent Negatives: Patient denies  Disposition: [x] ED /[] Urgent Care (no appt availability in office) / [] Appointment(In office/virtual)/ []  [place] [facility]/ [] Home Care/ [] Refused Recommended Disposition /[] [place] [facility]/ [x]  Follow-up with PCP Additional Notes: Pt was seen at dialysis today. Spoke with pt's mother. Pt states that BP at dialysis was 200/100. Dialysis clinic told pt to call PCP immediately. Pt will not go to ED. PT will lay down and retake BP and if still high will seek care.    Reason for Disposition  [3] Systolic BP  >= 845 OR Diastolic >= 364 AND [6] cardiac or neurologic symptoms (e.g., chest pain, difficulty breathing, unsteady gait, blurred vision)  Answer Assessment - Initial Assessment Questions 1. BLOOD PRESSURE: "What is the blood pressure?" "Did you take at least two measurements 5 minutes apart?"     200/100 at dialysis 2. ONSET: "When did you take your blood pressure?"     At clinic 3. HOW: "How did you obtain the blood pressure?" (e.g., visiting nurse, automatic home BP monitor)      4. HISTORY: "Do you have a history of high blood pressure?"      5. MEDICATIONS: "Are you taking any medications for blood pressure?" "Have you missed any doses recently?"      6. OTHER SYMPTOMS: "Do you have any symptoms?" (e.g., headache, chest pain, blurred vision, difficulty breathing, weakness)      7. PREGNANCY: "Is there any chance you are pregnant?" "When was your last menstrual period?"  Protocols used: Blood Pressure - High-A-AH

## 2021-11-23 NOTE — Telephone Encounter (Signed)
Have they called his kidney doctor?? They may be able to advise.  I havent seen him since he went on dialysis so hard to give advise .  ED may be best option.   Clarify and make sure he is taking:  Amlodipine 10mg  daily,  clonidine 0.2mg  two times daily,  hydralazine 100mg  three times daily  Labetalol 200mg  three times daily  Make sure he is not missing dialysis sessions

## 2021-11-24 ENCOUNTER — Ambulatory Visit: Payer: Medicaid Other | Admitting: Critical Care Medicine

## 2021-12-01 ENCOUNTER — Other Ambulatory Visit: Payer: Self-pay | Admitting: Critical Care Medicine

## 2021-12-01 ENCOUNTER — Other Ambulatory Visit: Payer: Self-pay | Admitting: Cardiology

## 2021-12-01 DIAGNOSIS — I10 Essential (primary) hypertension: Secondary | ICD-10-CM

## 2021-12-01 NOTE — Telephone Encounter (Signed)
Requested medication (s) are due for refill today:   No ? ?Requested medication (s) are on the active medication list:   No ? ?Future visit scheduled:   No  Seen 2 months ago by Dr. Joya Gaskins ? ? ?Last ordered: Returned because this medication was discontinued 09/30/2021.   Provider to review.     ? ?Requested Prescriptions  ?Pending Prescriptions Disp Refills  ? EQ ASPIRIN ADULT LOW DOSE 81 MG EC tablet [Pharmacy Med Name: EQ Aspirin Adult Low Dose 81 MG Oral Tablet Delayed Release] 100 tablet 0  ?  Sig: Take 1 tablet by mouth once daily  ?  ? Analgesics:  NSAIDS - aspirin Failed - 12/01/2021  5:56 AM  ?  ?  Failed - Cr in normal range and within 360 days  ?  Creat  ?Date Value Ref Range Status  ?06/20/2017 3.44 (H) 0.60 - 1.35 mg/dL Final  ? ?Creatinine, Ser  ?Date Value Ref Range Status  ?09/30/2021 7.13 (H) 0.61 - 1.24 mg/dL Final  ? ?Creatinine, Urine  ?Date Value Ref Range Status  ?09/30/2016 93.95 mg/dL Final  ?  ?  ?  ?  Failed - eGFR is 10 or above and within 360 days  ?  GFR, Est African American  ?Date Value Ref Range Status  ?06/20/2017 25 (L) > OR = 60 mL/min/1.23m Final  ? ?GFR calc Af Amer  ?Date Value Ref Range Status  ?03/17/2020 19 (L) >59 mL/min/1.73 Final  ?  Comment:  ?  **[facility] currently reports eGFR in compliance with the current** ?  recommendations of the [employer] [facility] will ?  update reporting as new guidelines are published from the NKF-ASN ?  Task force. ?  ? ?GFR, Est Non African American  ?Date Value Ref Range Status  ?06/20/2017 21 (L) > OR = 60 mL/min/1.732mFinal  ? ?GFR, Estimated  ?Date Value Ref Range Status  ?09/30/2021 9 (L) >60 mL/min Final  ?  Comment:  ?  (NOTE) ?Calculated using the CKD-EPI Creatinine Equation (2021) ?  ? ?eGFR  ?Date Value Ref Range Status  ?03/29/2021 7 (L) >59 mL/min/1.73 Final  ?  ?  ?  ?  Passed - Patient is not pregnant  ?  ?  Passed - Valid encounter within last 12 months  ?  Recent Outpatient Visits   ? ?      ? 2 months ago CKD  (chronic kidney disease) stage 5, GFR less than 15 ml/min (HCC)  ? [place]ie StainMD  ? 8 months ago Uncontrolled hypertension  ? [place]lsie StainMD  ? 5 years ago Cough  ? [place], [place]? 5 years ago Essential hypertension  ? Co[place]RPH-CPP  ? 5 years ago Essential hypertension  ? [place], [place]? ?  ?  ? ?  ?  ?  ? ?

## 2021-12-07 ENCOUNTER — Other Ambulatory Visit: Payer: Self-pay | Admitting: Cardiology

## 2021-12-07 ENCOUNTER — Telehealth: Payer: Self-pay | Admitting: Cardiology

## 2021-12-07 DIAGNOSIS — I10 Essential (primary) hypertension: Secondary | ICD-10-CM

## 2021-12-07 NOTE — Telephone Encounter (Signed)
Called pt and left message asking pt to call back to set up an overdue appt with Dr. Marlou Porch before anymore refills or pt can refill with PCP, needed pt to call back to inform the office what he would like to do. ?

## 2021-12-07 NOTE — Telephone Encounter (Signed)
? ?*  STAT* If patient is at the pharmacy, call can be transferred to refill team. ? ? ?1. Which medications need to be refilled? (please list name of each medication and dose if known)  ? ?hydrALAZINE (APRESOLINE) 100 MG tablet ?cloNIDine (CATAPRES) 0.2 MG tablet ? ?2. Which pharmacy/location (including street and city if local pharmacy) is medication to be sent to? ? ?[place] ([place]), [place] - [address] ? ?3. Do they need a 30 day or 90 day supply?  90 ? ?Appt scheduled with Dr Marlou Porch on 01/31/22. Patient is out of medication ?

## 2021-12-08 MED ORDER — CLONIDINE HCL 0.2 MG PO TABS: 0.2000 mg | ORAL_TABLET | Freq: Three times a day (TID) | ORAL | 0 refills | Status: DC

## 2021-12-08 MED ORDER — HYDRALAZINE HCL 100 MG PO TABS: ORAL_TABLET | ORAL | 0 refills | Status: DC

## 2021-12-08 NOTE — Telephone Encounter (Signed)
Pt's medications were sent to pt's pharmacy as requested. Confirmation received.  

## 2022-01-16 ENCOUNTER — Telehealth: Payer: Self-pay | Admitting: Critical Care Medicine

## 2022-01-16 ENCOUNTER — Other Ambulatory Visit: Payer: Self-pay | Admitting: Cardiology

## 2022-01-16 DIAGNOSIS — I10 Essential (primary) hypertension: Secondary | ICD-10-CM

## 2022-01-17 NOTE — Telephone Encounter (Signed)
Requested medication (s) are due for refill today: yes ? ?Requested medication (s) are on the active medication list: no ? ?Last refill:  08/02/21 ? ?Future visit scheduled: no ? ?Notes to clinic:  rx was dc'd on 09/30/21 by another provider. Please assess ? ? ?  ?Requested Prescriptions  ?Pending Prescriptions Disp Refills  ? EQ ASPIRIN ADULT LOW DOSE 81 MG EC tablet [Pharmacy Med Name: EQ Aspirin Adult Low Dose 81 MG Oral Tablet Delayed Release] 100 tablet 0  ?  Sig: Take 1 tablet by mouth once daily  ?  ? Analgesics:  NSAIDS - aspirin Failed - 01/16/2022  3:39 PM  ?  ?  Failed - Cr in normal range and within 360 days  ?  Creat  ?Date Value Ref Range Status  ?06/20/2017 3.44 (H) 0.60 - 1.35 mg/dL Final  ? ?Creatinine, Ser  ?Date Value Ref Range Status  ?09/30/2021 7.13 (H) 0.61 - 1.24 mg/dL Final  ? ?Creatinine, Urine  ?Date Value Ref Range Status  ?09/30/2016 93.95 mg/dL Final  ?  ?  ?  ?  Failed - eGFR is 10 or above and within 360 days  ?  GFR, Est African American  ?Date Value Ref Range Status  ?06/20/2017 25 (L) > OR = 60 mL/min/1.60m Final  ? ?GFR calc Af Amer  ?Date Value Ref Range Status  ?03/17/2020 19 (L) >59 mL/min/1.73 Final  ?  Comment:  ?  **[facility] currently reports eGFR in compliance with the current** ?  recommendations of the [employer] [facility] will ?  update reporting as new guidelines are published from the NKF-ASN ?  Task force. ?  ? ?GFR, Est Non African American  ?Date Value Ref Range Status  ?06/20/2017 21 (L) > OR = 60 mL/min/1.726mFinal  ? ?GFR, Estimated  ?Date Value Ref Range Status  ?09/30/2021 9 (L) >60 mL/min Final  ?  Comment:  ?  (NOTE) ?Calculated using the CKD-EPI Creatinine Equation (2021) ?  ? ?eGFR  ?Date Value Ref Range Status  ?03/29/2021 7 (L) >59 mL/min/1.73 Final  ?  ?  ?  ?  Passed - Patient is not pregnant  ?  ?  Passed - Valid encounter within last 12 months  ?  Recent Outpatient Visits   ? ?      ? 3 months ago CKD (chronic kidney disease) stage 5,  GFR less than 15 ml/min (HCC)  ? [place]lsie StainMD  ? 9 months ago Uncontrolled hypertension  ? Co[place]rElsie StainMD  ? 5 years ago Cough  ? [place], [place]? 5 years ago Essential hypertension  ? Co[place]RPH-CPP  ? 5 years ago Essential hypertension  ? [place]? ?  ?  ?Future Appointments   ? ?        ? In 2 weeks [place] PainMD [facility] [place]  ? ?  ? ? ?  ?  ?  ? ?

## 2022-01-18 NOTE — Telephone Encounter (Signed)
Cannot refill   not seen since 09/2021 ? ?Pls get him in with me on a non dialysis day ?

## 2022-01-18 NOTE — Telephone Encounter (Signed)
Called and left a vm about appt ?

## 2022-01-18 NOTE — Telephone Encounter (Signed)
Patient mom called in needing to inform Dr Joya Gaskins that he had traZODone (DESYREL) 100 MG tablet in 3 days Please advise  ?

## 2022-01-24 ENCOUNTER — Other Ambulatory Visit: Payer: Self-pay | Admitting: Critical Care Medicine

## 2022-01-24 NOTE — Telephone Encounter (Signed)
Appt scheduled 02/10/22 ?

## 2022-01-24 NOTE — Telephone Encounter (Signed)
Medication Refill - Medication: traZODone (DESYREL) 100 MG tablet [[id number]]  ? ?Has the patient contacted their pharmacy? Yes.   ? ? ?Preferred Pharmacy (with phone number or street name):  ?[place] ([place] - [address]  ?[address] [place] ([place]) [place] [address]  ?Phone: [phone] Fax: [phone]  ?Hours: Not open 24 hours  ? ? ? ?Has the patient been seen for an appointment in the last year OR does the patient have an upcoming appointment? Yes.   ? ?Agent: Please be advised that RX refills may take up to 3 business days. We ask that you follow-up with your pharmacy. ? ?

## 2022-01-26 MED ORDER — TRAZODONE HCL 100 MG PO TABS: 100.0000 mg | ORAL_TABLET | Freq: Every day | ORAL | 0 refills | Status: DC

## 2022-01-26 NOTE — Telephone Encounter (Signed)
Requested Prescriptions  ?Pending Prescriptions Disp Refills  ?? traZODone (DESYREL) 100 MG tablet 90 tablet 0  ?  Sig: Take 1 tablet (100 mg total) by mouth at bedtime.  ?  ? Psychiatry: Antidepressants - Serotonin Modulator Passed - 01/24/2022  3:17 PM  ?  ?  Passed - Valid encounter within last 6 months  ?  Recent Outpatient Visits   ?      ? 4 months ago CKD (chronic kidney disease) stage 5, GFR less than 15 ml/min (HCC)  ? [place] Elsie Stain, MD  ? 10 months ago Uncontrolled hypertension  ? [place] Elsie Stain, MD  ? 5 years ago Cough  ? [place], [place], [place]  ? 5 years ago Essential hypertension  ? [place], RPH-CPP  ? 5 years ago Essential hypertension  ? [place], [place], [place]  ?  ?  ?Future Appointments   ?        ? In 5 days Skains, Thana Farr, MD [facility], [facility]  ? In 2 weeks [place], Dionne Bucy, PA-C [place]  ?  ? ?  ?  ?  ? ? ?

## 2022-01-31 ENCOUNTER — Ambulatory Visit: Payer: Medicaid Other | Admitting: Cardiology

## 2022-02-03 ENCOUNTER — Emergency Department (HOSPITAL_COMMUNITY)
Admission: EM | Admit: 2022-02-03 | Discharge: 2022-02-04 | Disposition: A | Discharge status: Home / Self Care | Payer: Medicaid Other | Attending: Emergency Medicine | Admitting: Emergency Medicine

## 2022-02-03 ENCOUNTER — Emergency Department (HOSPITAL_COMMUNITY): Payer: Medicaid Other

## 2022-02-03 ENCOUNTER — Encounter (HOSPITAL_COMMUNITY): Payer: Self-pay | Admitting: Emergency Medicine

## 2022-02-03 ENCOUNTER — Other Ambulatory Visit: Payer: Self-pay

## 2022-02-03 DIAGNOSIS — Z992 Dependence on renal dialysis: Secondary | ICD-10-CM | POA: Insufficient documentation

## 2022-02-03 DIAGNOSIS — M7981 Nontraumatic hematoma of soft tissue: Secondary | ICD-10-CM | POA: Insufficient documentation

## 2022-02-03 DIAGNOSIS — M7989 Other specified soft tissue disorders: Secondary | ICD-10-CM | POA: Diagnosis present

## 2022-02-03 DIAGNOSIS — D631 Anemia in chronic kidney disease: Secondary | ICD-10-CM | POA: Insufficient documentation

## 2022-02-03 DIAGNOSIS — N186 End stage renal disease: Secondary | ICD-10-CM | POA: Diagnosis not present

## 2022-02-03 DIAGNOSIS — R233 Spontaneous ecchymoses: Secondary | ICD-10-CM

## 2022-02-03 LAB — CBC
HCT: 20.8 % — ABNORMAL LOW (ref 39.0–52.0)
Hemoglobin: 6.3 g/dL — CL (ref 13.0–17.0)
MCH: 30.3 pg (ref 26.0–34.0)
MCHC: 30.3 g/dL (ref 30.0–36.0)
MCV: 100 fL (ref 80.0–100.0)
Platelets: 215 10*3/uL (ref 150–400)
RBC: 2.08 MIL/uL — ABNORMAL LOW (ref 4.22–5.81)
RDW: 17.9 % — ABNORMAL HIGH (ref 11.5–15.5)
WBC: 6 10*3/uL (ref 4.0–10.5)
nRBC: 0 % (ref 0.0–0.2)

## 2022-02-03 LAB — LACTIC ACID, PLASMA: Lactic Acid, Venous: 1.1 mmol/L (ref 0.5–1.9)

## 2022-02-03 LAB — BASIC METABOLIC PANEL
Anion gap: 13 (ref 5–15)
BUN: 75 mg/dL — ABNORMAL HIGH (ref 6–20)
CO2: 19 mmol/L — ABNORMAL LOW (ref 22–32)
Calcium: 7.8 mg/dL — ABNORMAL LOW (ref 8.9–10.3)
Chloride: 110 mmol/L (ref 98–111)
Creatinine, Ser: 14.93 mg/dL — ABNORMAL HIGH (ref 0.61–1.24)
GFR, Estimated: 4 mL/min — ABNORMAL LOW (ref >60)
Glucose, Bld: 90 mg/dL (ref 70–99)
Potassium: 5.2 mmol/L — ABNORMAL HIGH (ref 3.5–5.1)
Sodium: 142 mmol/L (ref 135–145)

## 2022-02-03 LAB — TROPONIN I (HIGH SENSITIVITY)
Troponin I (High Sensitivity): 35 ng/L — ABNORMAL HIGH (ref <18)
Troponin I (High Sensitivity): 35 ng/L — ABNORMAL HIGH (ref <18)

## 2022-02-03 NOTE — ED Provider Notes (Signed)
? ?[facility]  ?Provider Note ? ?CSN: [id number] ?Arrival date & time: 02/03/22 1846 ? ?History ?Chief Complaint  ?Patient presents with  ? Arm Problem  ? Shortness of Breath  ? ? ?Stanley [name] is a 46 y.o. male with history of ESRD on HD reports he has been getting dialysis TThSa through a vascath in R upper chest but also had a fistula placed in his L forearm in December. He reports they attempted to his his fistula at dialysis earlier this week (he is unsure of the day, but presumably Tuesday) and were unsuccessful getting access. He reports since that time he has had increased swelling/bruising to that forearm. He was told the day of his last dialysis 'not to come back until he had his forearm looked at' but he didn't decide to come to the ED until today. He did not go to dialysis today. He is reporting increased SOB and orthopnea. No fever cough or chest pain.  ? ? ?Home Medications ?Prior to Admission medications   ?Medication Sig Start Date End Date Taking? Authorizing Provider  ?acetaminophen (TYLENOL) 500 MG tablet Take 1,000 mg by mouth as needed for mild pain.    [provider]  ?albuterol (PROAIR HFA) 108 (90 Base) MCG/ACT inhaler Inhale 1-2 puffs into the lungs every 6 (six) hours as needed for wheezing or shortness of breath. ?Patient taking differently: Inhale 1-2 puffs into the lungs as needed for wheezing or shortness of breath. 03/18/20   [name], FNP  ?amLODipine (NORVASC) 10 MG tablet Take 1 tablet (10 mg total) by mouth daily. 03/17/20 09/24/21  Jerline Pain, MD  ?atorvastatin (LIPITOR) 40 MG tablet Take 1 tablet (40 mg total) by mouth daily. 03/29/21   Elsie Stain, MD  ?calcium acetate (PHOSLO) 667 MG tablet Take 1 tablet (667 mg total) by mouth 3 (three) times daily with meals. 08/15/21   Elsie Stain, MD  ?cloNIDine (CATAPRES) 0.2 MG tablet Take 1 tablet (0.2 mg total) by mouth 3 (three) times daily. 12/08/21   Jerline Pain, MD   ?folic acid (FOLVITE) 696 MCG tablet Take 0.5 tablets (400 mcg total) by mouth daily. 03/10/20   [name], FNP  ?hydrALAZINE (APRESOLINE) 100 MG tablet TAKE 1 TABLET BY MOUTH THREE TIMES DAILY. Please keep upcoming appt in May 2023 with Dr. Marlou Porch before anymore refills. Thank you Final Attempt 12/08/21   Jerline Pain, MD  ?labetalol (NORMODYNE) 200 MG tablet TAKE 3 TABLETS BY MOUTH THREE TIMES DAILY. APPOINTMENT REQUIRED FOR FUTURE REFILLS 01/18/22   Jerline Pain, MD  ?Melatonin 10 MG CAPS Take 1 tablet by mouth daily.    [provider]  ?Multiple Vitamin (MULTIVITAMIN WITH MINERALS) TABS tablet Take 1 tablet by mouth daily.    [provider]  ?ondansetron (ZOFRAN ODT) 4 MG disintegrating tablet Take 1 tablet (4 mg total) by mouth every 8 (eight) hours as needed for nausea or vomiting. 03/29/21   Elsie Stain, MD  ?traZODone (DESYREL) 100 MG tablet Take 1 tablet (100 mg total) by mouth at bedtime. 01/26/22   Elsie Stain, MD  ? ? ? ?Allergies    ?Lactose intolerance (gi), Nitroglycerin, Other, and Isosorbide ? ? ?Review of Systems   ?Review of Systems ?Please see HPI for pertinent positives and negatives ? ?Physical Exam ?BP (!) 182/102   Pulse 66   Temp 98 ?F (36.7 ?C) (Oral)   Resp 16   SpO2 100%  ? ?  Physical Exam ?Vitals and nursing note reviewed.  ?Constitutional:   ?   Appearance: Normal appearance.  ?HENT:  ?   Head: Normocephalic and atraumatic.  ?   Nose: Nose normal.  ?   Mouth/Throat:  ?   Mouth: Mucous membranes are moist.  ?Eyes:  ?   Extraocular Movements: Extraocular movements intact.  ?   Conjunctiva/sclera: Conjunctivae normal.  ?Cardiovascular:  ?   Rate and Rhythm: Normal rate.  ?Pulmonary:  ?   Effort: Pulmonary effort is normal.  ?   Breath sounds: Normal breath sounds.  ?   Comments: Dialysis catheter in R upper chest without signs of infection ?Abdominal:  ?   General: Abdomen is flat.  ?   Palpations: Abdomen is soft.  ?   Tenderness: There is no  abdominal tenderness.  ?Musculoskeletal:     ?   General: No swelling. Normal range of motion.  ?   Cervical back: Neck supple.  ?   Right lower leg: Edema (trace) present.  ?   Left lower leg: Edema (trace) present.  ?   Comments: Soft tissue swelling and ecchymosis of L forearm, there is a distal pulse and palpable thrill in fistula.   ?Skin: ?   General: Skin is warm and dry.  ?Neurological:  ?   General: No focal deficit present.  ?   Mental Status: He is alert.  ?Psychiatric:     ?   Mood and Affect: Mood normal.  ? ? ?ED Results / Procedures / Treatments   ?EKG ?EKG Interpretation ? ?Date/Time:  Thursday Feb 03 2022 20:19:14 EDT ?Ventricular Rate:  64 ?PR Interval:  178 ?QRS Duration: 88 ?QT Interval:  476 ?QTC Calculation: 491 ?R Axis:   30 ?Text Interpretation: Normal sinus rhythm Prolonged QT Abnormal ECG When compared with ECG of 25-Sep-2021 05:42, No significant change since last tracing Confirmed by Calvert Cantor [phone]) on 02/03/2022 11:33:55 PM ? ?Procedures ?Procedures ? ?Medications Ordered in the ED ?Medications  ?0.9 %  sodium chloride infusion (10 mL/hr Intravenous New Bag/Given 02/04/22 0412)  ?labetalol (NORMODYNE) injection 20 mg (20 mg Intravenous Given 02/04/22 0109)  ? ? ?Initial Impression and Plan ? Patient with bruising/swelling in L forearm after attempted dialysis fistula access earlier this week. Still has good pulses and palpable thrill, doubt compartment syndrome. Also complaining of SOB and orthopnea. Labs done in triage showed CBC with anemia, worse than baseline and now less than 7. BMP with CKD, mildly elevated K. Will add CXR and discuss with Nephrology.  ? ?ED Course  ? ?Clinical Course as of 02/04/22 0526  ?Fri Feb 04, 2022  ?[facility] consulted.  [CS]  ?[place] with Dr. Joelyn Oms, Nephrology, who recommends conservative management of his forearm infiltrate. He recommend giving 1unit of blood and better BP control. Patient will need outpatient dialysis in the AM.  [CS]   ?0523 Patient completed transfusion. R arm remains soft, no concern for compartment syndrome. Advised to go directly to outpatient dialysis from the ED.  [CS]  ?  ?Clinical Course User Index ?[CS] Truddie Hidden, MD  ? ? ? ?MDM Rules/Calculators/A&P ?Medical Decision Making ?Problems Addressed: ?Anemia associated with chronic renal failure: chronic illness or injury with exacerbation, progression, or side effects of treatment ?ESRD on hemodialysis [facility]): chronic illness or injury ?Spontaneous hematoma of forearm: acute illness or injury ? ?Amount and/or Complexity of Data Reviewed ?Labs: ordered. Decision-making details documented in ED Course. ?Radiology: ordered and independent interpretation performed. Decision-making details documented in  ED Course. ? ?Risk ?Prescription drug management. ?Decision regarding hospitalization. ? ? ? ?Final Clinical Impression(s) / ED Diagnoses ?Final diagnoses:  ?Spontaneous hematoma of forearm  ?ESRD on hemodialysis ([place])  ?Anemia associated with chronic renal failure  ? ? ?Rx / DC Orders ?ED Discharge Orders   ? ? None  ? ?  ? ?  ?Truddie Hidden, MD ?02/04/22 [phone] ? ?

## 2022-02-03 NOTE — ED Notes (Signed)
Charge RN made aware of hgb 6.3.  ?

## 2022-02-03 NOTE — ED Triage Notes (Addendum)
Pt reported to ED with c/o swelling to LUE from fistula site that was "stuck" at dialysis clinic three days ago. Pt also endorses shortness of breath since yesterday. States he is T-TH-S dialysis pt and missed last treatment. ?

## 2022-02-03 NOTE — ED Provider Triage Note (Signed)
Emergency Medicine Provider Triage Evaluation Note ? ?Stanley [name] , a 46 y.o. male  was evaluated in triage.  Pt complains of left arm pain.  He is a dialysis patient.  He goes Tuesday Thursday Saturday.  He he states that he dialyzed 2 days ago.  He was stuck in the left arm where he has a fistula and says that time has had severe pain, global swelling, bruising, heat redness and chills.  He feels somewhat confused.  Who came in for further evaluation ? ?Review of Systems  ?Positive: Left arm pain and swelling ?Negative: Numbness ? ?Physical Exam  ?BP (!) 170/95 (BP Location: Right Arm)   Pulse 69   Temp 98.7 ?F (37.1 ?C) (Oral)   Resp 16   SpO2 96%  ?Gen:   Awake, no distress   ?Resp:  Normal effort  ?MSK:   Moves extremities without difficulty  ?Other:  Bruising heat swelling and tenderness of the left arm, palpable thrill ? ?Medical Decision Making  ?Medically screening exam initiated at 7:58 PM.  Appropriate orders placed.  Nasif S Bargo was informed that the remainder of the evaluation will be completed by another provider, this initial triage assessment does not replace that evaluation, and the importance of remaining in the ED until their evaluation is complete. ? ?Work-up initiated ?  ?Margarita Mail, PA-C ?02/03/22 1959 ? ?

## 2022-02-04 LAB — PREPARE RBC (CROSSMATCH)

## 2022-02-04 MED ORDER — LABETALOL HCL 5 MG/ML IV SOLN
20.0000 mg | Freq: Once | INTRAVENOUS | Status: AC
  Administered 2022-02-04: 20 mg via INTRAVENOUS
  Filled 2022-02-04: qty 4

## 2022-02-04 MED ORDER — SODIUM CHLORIDE 0.9 % IV SOLN
10.0000 mL/h | Freq: Once | INTRAVENOUS | Status: AC
  Administered 2022-02-04: 10 mL/h via INTRAVENOUS

## 2022-02-04 NOTE — ED Notes (Signed)
Patient in NAD post transfusion. ?

## 2022-02-04 NOTE — ED Notes (Signed)
Patient denies any s/s of blood transfusion reaction. ?

## 2022-02-05 LAB — TYPE AND SCREEN
ABO/RH(D): O POS
Antibody Screen: NEGATIVE
Unit division: 0

## 2022-02-05 LAB — BPAM RBC
Blood Product Expiration Date: 202305312359
ISSUE DATE / TIME: 202305050202
Unit Type and Rh: 5100

## 2022-02-08 DIAGNOSIS — D689 Coagulation defect, unspecified: Secondary | ICD-10-CM | POA: Insufficient documentation

## 2022-02-08 LAB — CULTURE, BLOOD (ROUTINE X 2)
Culture: NO GROWTH
Culture: NO GROWTH
Special Requests: ADEQUATE
Special Requests: ADEQUATE

## 2022-02-10 ENCOUNTER — Inpatient Hospital Stay: Payer: Medicaid Other | Admitting: Physician Assistant

## 2022-02-10 ENCOUNTER — Ambulatory Visit: Payer: Medicaid Other | Admitting: Physician Assistant

## 2022-02-10 NOTE — Progress Notes (Deleted)
Patient ID: KYION GAUTIER, male   DOB: [birth year], 46 y.o.   MRN: [id number]   Patient with bruising/swelling in L forearm after attempted dialysis fistula access earlier this week. Still has good pulses and palpable thrill, doubt compartment syndrome. Also complaining of SOB and orthopnea. Labs done in triage showed CBC with anemia, worse than baseline and now less than 7. BMP with CKD, mildly elevated K. Will add CXR and discuss with Nephrology.   Nephrology consulted.  [CS]   [phone] Spoke with Dr. Joelyn Oms, Nephrology, who recommends conservative management of his forearm infiltrate. He recommend giving 1unit of blood and better BP control. Patient will need outpatient dialysis in the AM.  [CS]  [phone] Patient completed transfusion. R arm remains soft, no concern for compartment syndrome. Advised to go directly to outpatient dialysis from the ED.  [CS]

## 2022-03-03 ENCOUNTER — Ambulatory Visit: Payer: Medicaid Other | Admitting: Physician Assistant

## 2022-03-03 NOTE — Progress Notes (Deleted)
Patient ID: Stanley [name], male   DOB: 06/16/1976, 45 y.o.   MRN: [id number]   After ED visit 02/03/2022 Spontaneous hematoma of forearm  ESRD on hemodialysis ([place])  Anemia associated with chronic renal failure    Medical Decision Making Problems Addressed: Anemia associated with chronic renal failure: chronic illness or injury with exacerbation, progression, or side effects of treatment ESRD on hemodialysis [facility]): chronic illness or injury Spontaneous hematoma of forearm: acute illness or injury   Amount and/or Complexity of Data Reviewed Labs: ordered. Decision-making details documented in ED Course. Radiology: ordered and independent interpretation performed. Decision-making details documented in ED Course.   Risk Prescription drug management. Decision regarding hospitalization.

## 2022-03-07 ENCOUNTER — Ambulatory Visit: Payer: Self-pay | Admitting: *Deleted

## 2022-03-07 NOTE — Telephone Encounter (Signed)
Second attempt to reach mother. Mailbox is full, unable to leave a message.

## 2022-03-07 NOTE — Telephone Encounter (Signed)
Message from Melbeta sent at 03/07/2022 12:26 PM EDT  Summary: vomiting/abdominal pain   Pt mother stated pt had a stomach ache, vomiting, and abdominal pain "earlier today." pt was requesting an appointment, but no appointments were available. pt declined to speak with a nurse and stated he just wanted to rest.   Mother mentioned they were in a hotel room (home), but she did not want to get sick.   Please advise.           Call History   Type Contact Phone/Fax User  03/07/2022 12:24 PM EDT Phone (Incoming) Labuda,Tracy (Mother) [phone] [place], [place]

## 2022-03-07 NOTE — Telephone Encounter (Signed)
Double book for this week or next ?

## 2022-03-07 NOTE — Telephone Encounter (Signed)
Attempted to return call to Stanley [name] (mother) to discuss with a nurse his abd pain and vomiting.   Voice mailbox is full so unable to leave a message.

## 2022-03-07 NOTE — Telephone Encounter (Signed)
Double book this week.

## 2022-03-07 NOTE — Telephone Encounter (Signed)
3rd attempt to return call however voice mailbox is full and unable to accept messages.  Per policy I have forwarded this to [facility].

## 2022-03-08 ENCOUNTER — Telehealth: Payer: Self-pay | Admitting: Critical Care Medicine

## 2022-03-08 NOTE — Telephone Encounter (Signed)
Pts mother requesting a referral to [facility] for home health care due to pt being on dialysis   Please assist pt further

## 2022-03-08 NOTE — Telephone Encounter (Signed)
That is not an indication for home care and he needs to actually see me in the office

## 2022-03-08 NOTE — Telephone Encounter (Signed)
Called and left vm for appt

## 2022-03-10 NOTE — Telephone Encounter (Signed)
Called and left vm for pt to get ov scheduled

## 2022-03-19 ENCOUNTER — Ambulatory Visit (HOSPITAL_COMMUNITY)
Admission: EM | Admit: 2022-03-19 | Discharge: 2022-03-19 | Disposition: A | Discharge status: Home / Self Care | Payer: Medicaid Other | Attending: Physician Assistant | Admitting: Physician Assistant

## 2022-03-19 ENCOUNTER — Encounter (HOSPITAL_COMMUNITY): Payer: Self-pay

## 2022-03-19 DIAGNOSIS — Z992 Dependence on renal dialysis: Secondary | ICD-10-CM

## 2022-03-19 DIAGNOSIS — I120 Hypertensive chronic kidney disease with stage 5 chronic kidney disease or end stage renal disease: Secondary | ICD-10-CM | POA: Diagnosis not present

## 2022-03-19 DIAGNOSIS — I10 Essential (primary) hypertension: Secondary | ICD-10-CM | POA: Diagnosis not present

## 2022-03-19 DIAGNOSIS — N186 End stage renal disease: Secondary | ICD-10-CM

## 2022-03-19 MED ORDER — HYDRALAZINE HCL 100 MG PO TABS: ORAL_TABLET | ORAL | 0 refills | Status: DC

## 2022-03-19 MED ORDER — TRAZODONE HCL 100 MG PO TABS: 100.0000 mg | ORAL_TABLET | Freq: Every day | ORAL | 0 refills | Status: DC

## 2022-03-19 MED ORDER — CLONIDINE HCL 0.2 MG PO TABS: 0.2000 mg | ORAL_TABLET | Freq: Three times a day (TID) | ORAL | 0 refills | Status: DC

## 2022-03-19 MED ORDER — AMLODIPINE BESYLATE 10 MG PO TABS: 10.0000 mg | ORAL_TABLET | Freq: Every day | ORAL | 0 refills | Status: DC

## 2022-03-19 NOTE — ED Triage Notes (Signed)
Pt states he is out of his all of his medications for the past 2 weeks, PT is a dialysis patient had dialysis this morning.

## 2022-03-19 NOTE — Discharge Instructions (Signed)
Medications refilled today for 30 days. Please follow up with PCP in the coming weeks.  Try to monitor BP at home and take log to PCP.

## 2022-03-19 NOTE — ED Provider Notes (Signed)
[place]   MRN: [id number] DOB: May 21, 1976  Subjective:   Stanley [name] is a 46 y.o. male presenting for medication refills.  He is here with his brother.  His brother states that their mother passed away about 2 weeks ago.  His brother went to visit him and noticed that all of his medication boxes were empty and was unsure of how long he has been without medicines.  Patient is on dialysis and completed dialysis today.  He is always tired after dialysis, and has a headache, but no other symptoms at this time.  Denies any chest pain or shortness of breath.  No vision changes.  No nausea or vomiting or weakness.  He has a primary care provider that he will be able to follow-up with, but brother thought it pertinent to get at least his blood pressure medications refilled today.  He is also having trouble sleeping as he has been without his trazodone and witnessed his mother's passing in front of him.  No current facility-administered medications for this encounter.  Current Outpatient Medications:    acetaminophen (TYLENOL) 500 MG tablet, Take 1,000 mg by mouth as needed for mild pain., Disp: , Rfl:    albuterol (PROAIR HFA) 108 (90 Base) MCG/ACT inhaler, Inhale 1-2 puffs into the lungs every 6 (six) hours as needed for wheezing or shortness of breath. (Patient taking differently: Inhale 1-2 puffs into the lungs as needed for wheezing or shortness of breath.), Disp: 18 g, Rfl: 11   amLODipine (NORVASC) 10 MG tablet, Take 1 tablet (10 mg total) by mouth daily., Disp: 30 tablet, Rfl: 0   atorvastatin (LIPITOR) 40 MG tablet, Take 1 tablet (40 mg total) by mouth daily., Disp: 30 tablet, Rfl: 11   calcium acetate (PHOSLO) 667 MG tablet, Take 1 tablet (667 mg total) by mouth 3 (three) times daily with meals., Disp: 90 tablet, Rfl: 4   cloNIDine (CATAPRES) 0.2 MG tablet, Take 1 tablet (0.2 mg total) by mouth 3 (three) times daily., Disp: 90 tablet, Rfl: 0   folic acid (FOLVITE) 409 MCG  tablet, Take 0.5 tablets (400 mcg total) by mouth daily., Disp: 30 tablet, Rfl: 11   hydrALAZINE (APRESOLINE) 100 MG tablet, TAKE 1 TABLET BY MOUTH THREE TIMES DAILY. Please keep upcoming appt in May 2023 with Dr. Marlou Porch before anymore refills. Thank you Final Attempt, Disp: 30 tablet, Rfl: 0   labetalol (NORMODYNE) 200 MG tablet, TAKE 3 TABLETS BY MOUTH THREE TIMES DAILY. APPOINTMENT REQUIRED FOR FUTURE REFILLS, Disp: 270 tablet, Rfl: 0   Melatonin 10 MG CAPS, Take 1 tablet by mouth daily., Disp: , Rfl:    Multiple Vitamin (MULTIVITAMIN WITH MINERALS) TABS tablet, Take 1 tablet by mouth daily., Disp: , Rfl:    ondansetron (ZOFRAN ODT) 4 MG disintegrating tablet, Take 1 tablet (4 mg total) by mouth every 8 (eight) hours as needed for nausea or vomiting., Disp: 30 tablet, Rfl: 3   traZODone (DESYREL) 100 MG tablet, Take 1 tablet (100 mg total) by mouth at bedtime., Disp: 30 tablet, Rfl: 0   Allergies  Allergen Reactions   Lactose Intolerance (Gi) Nausea And Vomiting   Nitroglycerin Anxiety and Other (See Comments)    Pain and feels like he is on fire   Other Swelling    All beans   Isosorbide Itching    headaches    Past Medical History:  Diagnosis Date   Anxiety    Chronic kidney disease (CKD), stage V ([place]) 11/2020  CKD (chronic kidney disease), stage IV ([facility])    Depression    Dyslexia    Dyspnea    Heart palpitations    High cholesterol    Hypertension    Hypertensive urgency    Polysubstance (including opioids) dependence, binge pattern ([place]) 08/2020   Stroke ([place]) 06/03/2017   "minor; completely recovered today" (06/13/2017)   Tobacco abuse      Past Surgical History:  Procedure Laterality Date   AV FISTULA PLACEMENT Left 09/29/2021   Procedure: LEFT RADIOCEPHALIC ARTERIOVENOUS (AV) FISTULA CREATION;  Surgeon: Cherre Robins, MD;  Location: [facility] OR;  Service: Vascular;  Laterality: Left;   INSERTION OF DIALYSIS CATHETER Right 09/25/2021   Procedure: INSERTION OF 19 sm  PALIDROME PRECISION CHRONIC CATHETER;  Surgeon: Cherre Robins, MD;  Location: [facility] OR;  Service: Vascular;  Laterality: Right;   NO PAST SURGERIES      Family History  Problem Relation Age of Onset   Hypertension Mother    Hypertension Brother    Diabetes type II Brother     Social History   Tobacco Use   Smoking status: Former    Packs/day: 1.00    Years: 30.00    Total pack years: 30.00    Types: Cigarettes   Smokeless tobacco: Never  Vaping Use   Vaping Use: Never used  Substance Use Topics   Alcohol use: No   Drug use: Yes    Types: Marijuana    Comment: 06/13/2017 "not often"    ROS REFER TO HPI FOR PERTINENT POSITIVES AND NEGATIVES   Objective:   Vitals: BP (!) 173/98 (BP Location: Right Arm)   Pulse 84   Temp 98.1 F (36.7 C) (Oral)   Resp 16   SpO2 98%   Physical Exam Vitals and nursing note reviewed.  Constitutional:      General: He is not in acute distress.    Appearance: Normal appearance. He is not ill-appearing, toxic-appearing or diaphoretic.  HENT:     Head: Normocephalic and atraumatic.     Right Ear: External ear normal.     Left Ear: External ear normal.     Nose: Nose normal.     Mouth/Throat:     Mouth: Mucous membranes are moist.     Pharynx: Oropharynx is clear.  Eyes:     Extraocular Movements: Extraocular movements intact.     Conjunctiva/sclera: Conjunctivae normal.     Pupils: Pupils are equal, round, and reactive to light.  Cardiovascular:     Rate and Rhythm: Normal rate and regular rhythm.     Pulses: Normal pulses.     Heart sounds: Normal heart sounds.  Pulmonary:     Effort: Pulmonary effort is normal.     Breath sounds: Normal breath sounds.  Abdominal:     General: Abdomen is flat. Bowel sounds are normal.     Palpations: Abdomen is soft.     Tenderness: There is no abdominal tenderness.  Skin:    General: Skin is warm and dry.  Neurological:     General: No focal deficit present.     Mental Status: He is  alert and oriented to person, place, and time.  Psychiatric:        Behavior: Behavior normal.     Comments: depressed     No results found for this or any previous visit (from the past 24 hour(s)).  Assessment and Plan :   PDMP not reviewed this encounter.  1. Essential hypertension  2. Uncontrolled hypertension   3. Hypertension, unspecified type   4. ESRD on dialysis [facility])    Reviewed patient's medications and sent a 30-day refill of his amlodipine, clonidine, hydralazine, trazodone.  Advised patient to follow-up with his PCP in the next few weeks for close follow-up.  No red flags noted on exam today.  Encourage patient to monitor his blood pressure readings at home.  ER precautions advised. Sympathy expressed about mother's passing - advised grief counseling. Pt agreeable and understanding.      Terel Bann, Randa Evens, PA-C 03/19/22 1758

## 2022-03-21 ENCOUNTER — Ambulatory Visit: Payer: Medicaid Other | Admitting: Physician Assistant

## 2022-03-22 ENCOUNTER — Ambulatory Visit: Payer: Self-pay | Admitting: *Deleted

## 2022-03-22 NOTE — Telephone Encounter (Signed)
Summary: discuss medications   Patient's brother inquiring which medications patient should currently be taking, brother states he recently took patient to uc and patient was prescribed 3 medications, but does not think patient is currently taking all medications prescribed by Dr. Joya Gaskins   Please call brother (on dpr)      Brother returned our call.  We reviewed medications that were prescribed at [facility] visit. PT is taking these medications. Pt is not currently taking medication prescribed by Dr. Joya Gaskins.   Per brother, their Mom passed away recently, but in her decline was not following up as well as was needed for the pt. Pt needs to have his meds reviewed.  Brother states that pt is feeling poorly. His stomach hurts he has thrown up. He has a sore throat. He felt too poorly to go to his dialysis today.  No appts available at [facility]. Brother does not not to take pt to UC as pt''s medical conditions are complex. Please advise how to proceed. Reason for Disposition  [1] Caller requesting NON-URGENT health information AND [2] PCP's office is the best resource  Requesting regular office appointment  Answer Assessment - Initial Assessment Questions 1. REASON FOR CALL or QUESTION: "What is your reason for calling today?" or "How can I best help you?" or "What question do you have that I can help answer?"     What HTN should pt be taking?  Protocols used: Information Only Call - No Triage-A-AH

## 2022-03-22 NOTE — Telephone Encounter (Signed)
No choice but UC. I am assigned mobile unit next two days. Add pt on next week

## 2022-03-22 NOTE — Telephone Encounter (Signed)
Summary: discuss medications   Patient's brother inquiring which medications patient should currently be taking, brother states he recently took patient to uc and patient was prescribed 3 medications, but does not think patient is currently taking all medications prescribed by Dr. Joya Gaskins   Please call brother (on dpr)       Called patient's brother Stanley [name] on [place] , to review patient's medications. No answer, LVMCT[facility] [phone].

## 2022-03-22 NOTE — Telephone Encounter (Signed)
Message left, explained I would try back or he can call [phone].

## 2022-03-24 ENCOUNTER — Other Ambulatory Visit: Payer: Self-pay | Admitting: Critical Care Medicine

## 2022-03-24 NOTE — Telephone Encounter (Signed)
Pt has appt already for next week with and medication for uc visit until appt

## 2022-03-24 NOTE — Telephone Encounter (Unsigned)
Copied from [place] [phone]. Topic: General - Other >> Mar 24, 2022  3:07 PM Everette C wrote: Reason for CRM: Medication Refill - Medication: cloNIDine (CATAPRES) 0.2 MG tablet [[id number]]   Has the patient contacted their pharmacy? No.The patient elected to contact their PCP first  (Agent: If no, request that the patient contact the pharmacy for the refill. If patient does not wish to contact the pharmacy document the reason why and proceed with request.) (Agent: If yes, when and what did the pharmacy advise?)  Preferred Pharmacy (with phone number or street name): [place] ([place]), [place] - [address] [place] ([place]) [place] [address] Phone: [phone] Fax: [phone] Hours: Not open 24 hours   Has the patient been seen for an appointment in the last year OR does the patient have an upcoming appointment? Yes.    Agent: Please be advised that RX refills may take up to 3 business days. We ask that you follow-up with your pharmacy.

## 2022-03-24 NOTE — Telephone Encounter (Signed)
Requested medication (s) are due for refill today: yes  Requested medication (s) are on the active medication list: yes  Last refill:  03/19/22-04/18/22 #90 0 refills  Future visit scheduled: yes  Notes to clinic:   last ordered by Alyssa Allwarrdt, PA do you want to refill?     Requested Prescriptions  Pending Prescriptions Disp Refills   cloNIDine (CATAPRES) 0.2 MG tablet 90 tablet 0    Sig: Take 1 tablet (0.2 mg total) by mouth 3 (three) times daily.     Cardiovascular:  Alpha-2 Agonists Failed - 03/24/2022  4:06 PM      Failed - Last BP in normal range    BP Readings from Last 1 Encounters:  03/19/22 (!) 173/98         Failed - Valid encounter within last 6 months    Recent Outpatient Visits           6 months ago CKD (chronic kidney disease) stage 5, GFR less than 15 ml/min [facility])   [place] Elsie Stain, MD   12 months ago Uncontrolled hypertension   [place], MD   5 years ago Cough   [place], [place], [place]   5 years ago Essential hypertension   [place], RPH-CPP   5 years ago Essential hypertension   [place], [place]       Future Appointments             In 6 days [place] Solo, Dionne Bucy, PA-C [place]   In 2 weeks [place], Terance Hart, PA-C [place], [facility]            Passed - Last Heart Rate in normal range    Pulse Readings from Last 1 Encounters:  03/19/22 84

## 2022-03-25 MED ORDER — CLONIDINE HCL 0.2 MG PO TABS: 0.2000 mg | ORAL_TABLET | Freq: Three times a day (TID) | ORAL | 0 refills | Status: DC

## 2022-03-30 ENCOUNTER — Encounter: Payer: Self-pay | Admitting: Physician Assistant

## 2022-03-30 ENCOUNTER — Ambulatory Visit: Payer: Medicaid Other | Attending: Physician Assistant | Admitting: Physician Assistant

## 2022-03-30 VITALS — BP 195/107 | HR 95 | Wt 188.0 lb

## 2022-03-30 DIAGNOSIS — N185 Chronic kidney disease, stage 5: Secondary | ICD-10-CM

## 2022-03-30 DIAGNOSIS — I10 Essential (primary) hypertension: Secondary | ICD-10-CM

## 2022-03-30 DIAGNOSIS — G4700 Insomnia, unspecified: Secondary | ICD-10-CM

## 2022-03-30 DIAGNOSIS — Z91199 Patient's noncompliance with other medical treatment and regimen due to unspecified reason: Secondary | ICD-10-CM

## 2022-03-30 DIAGNOSIS — N433 Hydrocele, unspecified: Secondary | ICD-10-CM

## 2022-03-30 DIAGNOSIS — Z09 Encounter for follow-up examination after completed treatment for conditions other than malignant neoplasm: Secondary | ICD-10-CM

## 2022-03-30 DIAGNOSIS — I509 Heart failure, unspecified: Secondary | ICD-10-CM

## 2022-03-30 MED ORDER — TRAZODONE HCL 100 MG PO TABS
100.0000 mg | ORAL_TABLET | Freq: Every day | ORAL | 1 refills | Status: AC
Start: 2022-03-30 — End: 2022-09-26

## 2022-03-30 MED ORDER — AMLODIPINE BESYLATE 10 MG PO TABS: 10.0000 mg | ORAL_TABLET | Freq: Every day | ORAL | 1 refills | Status: DC

## 2022-03-30 MED ORDER — CLONIDINE HCL 0.2 MG PO TABS
0.2000 mg | ORAL_TABLET | Freq: Once | ORAL | Status: AC
  Administered 2022-03-30: 0.2 mg via ORAL

## 2022-03-30 MED ORDER — ATORVASTATIN CALCIUM 40 MG PO TABS: 40.0000 mg | ORAL_TABLET | Freq: Every day | ORAL | 1 refills | Status: DC

## 2022-03-30 MED ORDER — CLONIDINE HCL 0.2 MG PO TABS: 0.2000 mg | ORAL_TABLET | Freq: Three times a day (TID) | ORAL | 1 refills | Status: DC

## 2022-03-30 MED ORDER — HYDRALAZINE HCL 100 MG PO TABS
ORAL_TABLET | ORAL | 1 refills | Status: DC
  Filled 2022-07-16: qty 90, 30d supply, fill #0

## 2022-03-30 MED ORDER — ALBUTEROL SULFATE HFA 108 (90 BASE) MCG/ACT IN AERS
1.0000 | INHALATION_SPRAY | Freq: Four times a day (QID) | RESPIRATORY_TRACT | 1 refills | Status: AC | PRN
Start: 2022-03-30 — End: ?

## 2022-03-30 NOTE — Progress Notes (Signed)
Patient ID: Stanley [name], male   DOB: [birth year], 46 y.o.   MRN: [id number]     Bo Teicher, is a 46 y.o. male  HGD:[id number]  DQQ:[id number]  DOB - [birth year]  Chief Complaint  Patient presents with   Medication Refill   Testicle Pain       Subjective:   Stanley [name] is a 46 y.o. male here today for a follow up visit After being seen at the ED 03/19/2022 for medication RF. Dialysis Tuesday, Thursday Saturday. Even though his brother took him to the ED for RF, the patient did not go and picked them up.  Skipped dialysis yesterday.  There is a friend with him today that is going to ensure he gets prescriptions and goes to dialysis tomorrow.  Mom recently passed away.  He has not been caring for himself for that reason.  He is starting to feel a little better but still grieving.  Denies SI/HI  From ED note: Stanley [name] is a 46 y.o. male presenting for medication refills.  He is here with his brother.  His brother states that their mother passed away about 2 weeks ago.  His brother went to visit him and noticed that all of his medication boxes were empty and was unsure of how long he has been without medicines.  Patient is on dialysis and completed dialysis today.  He is always tired after dialysis, and has a headache, but no other symptoms at this time.  Denies any chest pain or shortness of breath.  No vision changes.  No nausea or vomiting or weakness.  He has a primary care provider that he will be able to follow-up with, but brother thought it pertinent to get at least his blood pressure medications refilled today.  He is also having trouble sleeping as he has been without his trazodone and witnessed his mother's passing in front of him.  1. Essential hypertension   2. Uncontrolled hypertension   3. Hypertension, unspecified type   4. ESRD on dialysis [facility])     Reviewed patient's medications and sent a 30-day refill of his amlodipine, clonidine, hydralazine, trazodone.  Advised patient to  follow-up with his PCP in the next few weeks for close follow-up.  Patient has No headache, No chest pain, No abdominal pain - No Nausea, No new weakness tingling or numbness, No Cough - SOB.  No problems updated.  ALLERGIES: Allergies  Allergen Reactions   Lactose Intolerance (Gi) Nausea And Vomiting   Nitroglycerin Anxiety and Other (See Comments)    Pain and feels like he is on fire   Other Swelling    All beans   Isosorbide Itching    headaches    PAST MEDICAL HISTORY: Past Medical History:  Diagnosis Date   Anxiety    Chronic kidney disease (CKD), stage V ([place]) 11/2020   CKD (chronic kidney disease), stage IV (HCC)    Depression    Dyslexia    Dyspnea    Heart palpitations    High cholesterol    Hypertension    Hypertensive urgency    Polysubstance (including opioids) dependence, binge pattern ([place]) 08/2020   Stroke ([place]) 06/03/2017   "minor; completely recovered today" (06/13/2017)   Tobacco abuse     MEDICATIONS AT HOME: Prior to Admission medications   Medication Sig Start Date End Date Taking? Authorizing Provider  acetaminophen (TYLENOL) 500 MG tablet Take 1,000 mg by mouth as needed for mild pain.   Yes [provider]  calcium acetate (PHOSLO) 667 MG tablet Take 1 tablet (667 mg total) by mouth 3 (three) times daily with meals. 08/15/21  Yes Elsie Stain, MD  folic acid (FOLVITE) 932 MCG tablet Take 0.5 tablets (400 mcg total) by mouth daily. 03/10/20  Yes [name], FNP  Melatonin 10 MG CAPS Take 1 tablet by mouth daily.   Yes [provider]  Multiple Vitamin (MULTIVITAMIN WITH MINERALS) TABS tablet Take 1 tablet by mouth daily.   Yes [provider]  albuterol (PROAIR HFA) 108 (90 Base) MCG/ACT inhaler Inhale 1-2 puffs into the lungs every 6 (six) hours as needed for wheezing or shortness of breath. 03/30/22   Argentina Donovan, PA-C  amLODipine (NORVASC) 10 MG tablet Take 1 tablet (10 mg total) by mouth daily. 03/30/22  09/26/22  Argentina Donovan, PA-C  atorvastatin (LIPITOR) 40 MG tablet Take 1 tablet (40 mg total) by mouth daily. 03/30/22   Argentina Donovan, PA-C  cloNIDine (CATAPRES) 0.2 MG tablet Take 1 tablet (0.2 mg total) by mouth 3 (three) times daily. 03/30/22 09/26/22  Argentina Donovan, PA-C  hydrALAZINE (APRESOLINE) 100 MG tablet TAKE 1 TABLET BY MOUTH THREE TIMES DAILY. Please keep upcoming appt in May 2023 with Dr. Marlou Porch before anymore refills. Thank you Final Attempt 03/30/22   Argentina Donovan, PA-C  ondansetron (ZOFRAN ODT) 4 MG disintegrating tablet Take 1 tablet (4 mg total) by mouth every 8 (eight) hours as needed for nausea or vomiting. Patient not taking: Reported on 03/30/2022 03/29/21   Elsie Stain, MD  traZODone (DESYREL) 100 MG tablet Take 1 tablet (100 mg total) by mouth at bedtime. 03/30/22 09/26/22  Argentina Donovan, PA-C    ROS: Neg HEENT Neg resp Neg cardiac Neg GI Neg GU Neg MS Neg psych Neg neuro  Objective:   Vitals:   03/30/22 0915  BP: (!) 195/107  Pulse: 95  SpO2: 96%  Weight: 188 lb (85.3 kg)   Exam General appearance : Awake, alert, not in any distress. Speech Clear. Not toxic looking.  Blunted/depressed affect. HEENT: Atraumatic and Normocephalic Neck: Supple, no JVD. No cervical lymphadenopathy.  Chest: Good air entry bilaterally, CTAB.  No rales/rhonchi/wheezing CVS: S1 S2 regular, no murmurs.  Extremities: B/L Lower Ext shows no edema, both legs are warm to touch Neurology: Awake alert, and oriented X 3, CN II-XII intact, Non focal Skin: No Rash  Data Review Lab Results  Component Value Date   HGBA1C 5.0 07/10/2020   HGBA1C 5.0 07/10/2020   HGBA1C 5.0 (A) 07/10/2020   HGBA1C 5.0 07/10/2020    Assessment & Plan   1. Uncontrolled hypertension Going to get meds now and will take his doses today - cloNIDine (CATAPRES) tablet 0.2 mg - hydrALAZINE (APRESOLINE) 100 MG tablet; TAKE 1 TABLET BY MOUTH THREE TIMES DAILY. Please keep upcoming  appt in May 2023 with Dr. Marlou Porch before anymore refills. Thank you Final Attempt  Dispense: 90 tablet; Refill: 1 - cloNIDine (CATAPRES) 0.2 MG tablet; Take 1 tablet (0.2 mg total) by mouth 3 (three) times daily.  Dispense: 270 tablet; Refill: 1 - amLODipine (NORVASC) 10 MG tablet; Take 1 tablet (10 mg total) by mouth daily.  Dispense: 90 tablet; Refill: 1  2. CKD (chronic kidney disease) stage 5, GFR less than 15 ml/min (HCC) He has verbalized that he will go to dialysis tomorrow  3. Hospital discharge follow-up  4. Non compliance with medical treatment Compliance imperative.  Discussed self-care/grief reaction, etc  5. Hydrocele, unspecified  hydrocele type Refer urology   6. Chronic congestive heart failure, unspecified heart failure type (HCC) - atorvastatin (LIPITOR) 40 MG tablet; Take 1 tablet (40 mg total) by mouth daily.  Dispense: 90 tablet; Refill: 1  7. Insomnia, unspecified type - traZODone (DESYREL) 100 MG tablet; Take 1 tablet (100 mg total) by mouth at bedtime.  Dispense: 90 tablet; Refill: 1    Return in about 4 weeks (around 04/27/2022) for with [facility] for uncontrolled htn and 4 months with PCP.  The patient was given clear instructions to go to ER or return to medical center if symptoms don't improve, worsen or new problems develop. The patient verbalized understanding. The patient was told to call to get lab results if they haven't heard anything in the next week.      Freeman Caldron, PA-C [facility] and [facility] [place], [place]   03/30/2022, 9:33 AM

## 2022-04-07 NOTE — Progress Notes (Deleted)
Office Visit    Patient Name: Stanley [name] Date of Encounter: 04/07/2022  PCP:  Elsie Stain, MD   [place]  Cardiologist:  Candee Furbish, MD  Advanced Practice Provider:  No care team member to display Electrophysiologist:  None    Chief Complaint    Stanley [name] is a 46 y.o. male with a hx of hypertensive emergency, LVH, chest pain, CKD stage IV, stroke, HLD, tobacco abuse, and anxiety/depression presents today for follow-up visit.  He has been seen in the hypertension clinic by our Pharm.D. team.  He was seen 04/24/2019 and patient's blood pressure at that time was 142/89 mmHg pulse was 66 bpm.  Doxazosin was increased from 4 mg daily to 8 mg daily.  He has a history of multiple missed appointments over the past year.  ACE inhibitor/ARB, thiazide diuretics, and aldosterone antagonist have been avoided due to kidney function.  He was last seen 03/17/2020 by our Pharm.D. team.  At that appointment he was not using his nicotine patch and preferred to quit smoking cold [place].  Confirmed increasing labetalol dose from 400 mg to 600 mg 3 times a day and he was tolerating without any side effects.  At that time he was experiencing a headache and unilateral weakness on the right side of his body previously but was not experiencing at the time of his appointment.  The patient's mother was called and medications were confirmed.  Patient had reestablished with nephrology, Dr. [name].  Original BP was 240/106 mmHg in the office and the patient was provided clonidine 0.2 mg and BP decreased to 220/104 mmHg.  Discussed the risks of stroke and MI.  Refills provided for current medication regimen.  His amlodipine was restarted and his doxazosin was increased from 8 mg daily to 60 mg daily.  He was encouraged to reach out to his nephrologist and ultimately may need dialysis to help blood pressure.  Today, he ***   Past Medical History    Past Medical History:  Diagnosis  Date   Anxiety    Chronic kidney disease (CKD), stage V ([place]) 11/2020   CKD (chronic kidney disease), stage IV (HCC)    Depression    Dyslexia    Dyspnea    Heart palpitations    High cholesterol    Hypertension    Hypertensive urgency    Polysubstance (including opioids) dependence, binge pattern ([place]) 08/2020   Stroke ([place]) 06/03/2017   "minor; completely recovered today" (06/13/2017)   Tobacco abuse    Past Surgical History:  Procedure Laterality Date   AV FISTULA PLACEMENT Left 09/29/2021   Procedure: LEFT RADIOCEPHALIC ARTERIOVENOUS (AV) FISTULA CREATION;  Surgeon: Cherre Robins, MD;  Location: [place];  Service: Vascular;  Laterality: Left;   INSERTION OF DIALYSIS CATHETER Right 09/25/2021   Procedure: INSERTION OF 19 sm PALIDROME PRECISION CHRONIC CATHETER;  Surgeon: Cherre Robins, MD;  Location: [facility] OR;  Service: Vascular;  Laterality: Right;   NO PAST SURGERIES      Allergies  Allergies  Allergen Reactions   Lactose Intolerance (Gi) Nausea And Vomiting   Nitroglycerin Anxiety and Other (See Comments)    Pain and feels like he is on fire   Other Swelling    All beans   Isosorbide Itching    headaches     EKGs/Labs/Other Studies Reviewed:   The following studies were reviewed today: ***  EKG:  EKG is *** ordered today.  The ekg ordered today  demonstrates ***  Recent Labs: 09/24/2021: B Natriuretic Peptide 2,528.3 09/26/2021: ALT 13 09/29/2021: Magnesium 1.8 02/03/2022: BUN 75; Creatinine, Ser 14.93; Hemoglobin 6.3; Platelets 215; Potassium 5.2; Sodium 142  Recent Lipid Panel    Component Value Date/Time   CHOL 76 (L) 03/29/2021 1543   TRIG 37 03/29/2021 1543   HDL 40 03/29/2021 1543   CHOLHDL 1.9 03/29/2021 1543   CHOLHDL 4.3 09/27/2016 0301   VLDL 14 09/27/2016 0301   LDLCALC 25 03/29/2021 1543   LDLDIRECT 42 03/17/2020 1527    Home Medications   No outpatient medications have been marked as taking for the 04/08/22 encounter (Appointment) with  Elgie Collard, PA-C.     Review of Systems   ***   All other systems reviewed and are otherwise negative except as noted above.  Physical Exam    VS:  There were no vitals taken for this visit. , BMI There is no height or weight on file to calculate BMI.  Wt Readings from Last 3 Encounters:  03/30/22 188 lb (85.3 kg)  09/30/21 185 lb 3 oz (84 kg)  09/23/21 221 lb 6.4 oz (100.4 kg)     GEN: Well nourished, well developed, in no acute distress. HEENT: normal. Neck: Supple, no JVD, carotid bruits, or masses. Cardiac: ***RRR, no murmurs, rubs, or gallops. No clubbing, cyanosis, edema.  ***Radials/PT 2+ and equal bilaterally.  Respiratory:  ***Respirations regular and unlabored, clear to auscultation bilaterally. GI: Soft, nontender, nondistended. MS: No deformity or atrophy. Skin: Warm and dry, no rash. Neuro:  Strength and sensation are intact. Psych: Normal affect.  Assessment & Plan    Hx Hypertensive emergency -Current antihypertensive regimen: Norvasc 10 mg daily, clonidine 0.2 mg 3 times daily, hydralazine 100 mg 3 times daily, ***  CKD stage IV     Disposition: Follow up {follow up:15908} with Candee Furbish, MD or APP.  Signed, Elgie Collard, PA-C 04/07/2022, 10:36 AM [place]

## 2022-04-08 ENCOUNTER — Ambulatory Visit: Payer: Medicaid Other | Admitting: Physician Assistant

## 2022-04-08 DIAGNOSIS — I129 Hypertensive chronic kidney disease with stage 1 through stage 4 chronic kidney disease, or unspecified chronic kidney disease: Secondary | ICD-10-CM

## 2022-04-08 DIAGNOSIS — I10 Essential (primary) hypertension: Secondary | ICD-10-CM

## 2022-04-22 ENCOUNTER — Telehealth: Payer: Self-pay

## 2022-04-22 NOTE — Telephone Encounter (Signed)
Received referral from Dr. Moshe Cipro for pain and issues with cannulation - To scheduling

## 2022-04-26 ENCOUNTER — Other Ambulatory Visit: Payer: Self-pay | Admitting: Cardiology

## 2022-04-26 DIAGNOSIS — I10 Essential (primary) hypertension: Secondary | ICD-10-CM

## 2022-04-28 NOTE — Telephone Encounter (Signed)
Per Caren Griffins pt c/o pain in access arm, access needs to be evaluated

## 2022-05-06 ENCOUNTER — Ambulatory Visit: Payer: Medicaid Other | Admitting: Pharmacist

## 2022-05-20 ENCOUNTER — Other Ambulatory Visit: Payer: Self-pay

## 2022-05-20 ENCOUNTER — Encounter (HOSPITAL_COMMUNITY): Payer: Self-pay | Admitting: Emergency Medicine

## 2022-05-20 ENCOUNTER — Emergency Department (HOSPITAL_COMMUNITY): Payer: Medicaid Other

## 2022-05-20 ENCOUNTER — Observation Stay (HOSPITAL_COMMUNITY)
Admission: EM | Admit: 2022-05-20 | Discharge: 2022-05-22 | Disposition: A | Discharge status: Home / Self Care | Payer: Medicaid Other | Attending: Internal Medicine | Admitting: Internal Medicine

## 2022-05-20 DIAGNOSIS — Z8673 Personal history of transient ischemic attack (TIA), and cerebral infarction without residual deficits: Secondary | ICD-10-CM | POA: Diagnosis not present

## 2022-05-20 DIAGNOSIS — R112 Nausea with vomiting, unspecified: Secondary | ICD-10-CM

## 2022-05-20 DIAGNOSIS — Z5900 Homelessness unspecified: Secondary | ICD-10-CM | POA: Insufficient documentation

## 2022-05-20 DIAGNOSIS — I120 Hypertensive chronic kidney disease with stage 5 chronic kidney disease or end stage renal disease: Secondary | ICD-10-CM | POA: Insufficient documentation

## 2022-05-20 DIAGNOSIS — Z992 Dependence on renal dialysis: Secondary | ICD-10-CM | POA: Insufficient documentation

## 2022-05-20 DIAGNOSIS — Z23 Encounter for immunization: Secondary | ICD-10-CM | POA: Insufficient documentation

## 2022-05-20 DIAGNOSIS — R197 Diarrhea, unspecified: Secondary | ICD-10-CM

## 2022-05-20 DIAGNOSIS — Z79899 Other long term (current) drug therapy: Secondary | ICD-10-CM | POA: Diagnosis not present

## 2022-05-20 DIAGNOSIS — I160 Hypertensive urgency: Secondary | ICD-10-CM | POA: Diagnosis not present

## 2022-05-20 DIAGNOSIS — N186 End stage renal disease: Secondary | ICD-10-CM | POA: Diagnosis not present

## 2022-05-20 DIAGNOSIS — R1013 Epigastric pain: Secondary | ICD-10-CM

## 2022-05-20 DIAGNOSIS — Z87891 Personal history of nicotine dependence: Secondary | ICD-10-CM | POA: Insufficient documentation

## 2022-05-20 DIAGNOSIS — I10 Essential (primary) hypertension: Secondary | ICD-10-CM

## 2022-05-20 DIAGNOSIS — E785 Hyperlipidemia, unspecified: Secondary | ICD-10-CM

## 2022-05-20 LAB — COMPREHENSIVE METABOLIC PANEL
ALT: 7 U/L (ref 0–44)
AST: 12 U/L — ABNORMAL LOW (ref 15–41)
Albumin: 3.7 g/dL (ref 3.5–5.0)
Alkaline Phosphatase: 65 U/L (ref 38–126)
Anion gap: 18 — ABNORMAL HIGH (ref 5–15)
BUN: 64 mg/dL — ABNORMAL HIGH (ref 6–20)
CO2: 25 mmol/L (ref 22–32)
Calcium: 8 mg/dL — ABNORMAL LOW (ref 8.9–10.3)
Chloride: 96 mmol/L — ABNORMAL LOW (ref 98–111)
Creatinine, Ser: 15.87 mg/dL — ABNORMAL HIGH (ref 0.61–1.24)
GFR, Estimated: 3 mL/min — ABNORMAL LOW (ref >60)
Glucose, Bld: 86 mg/dL (ref 70–99)
Potassium: 3.7 mmol/L (ref 3.5–5.1)
Sodium: 139 mmol/L (ref 135–145)
Total Bilirubin: 0.7 mg/dL (ref 0.3–1.2)
Total Protein: 7.3 g/dL (ref 6.5–8.1)

## 2022-05-20 LAB — HEPATITIS C ANTIBODY: HCV Ab: NONREACTIVE

## 2022-05-20 LAB — URINALYSIS, ROUTINE W REFLEX MICROSCOPIC
Bilirubin Urine: NEGATIVE
Glucose, UA: NEGATIVE mg/dL
Hgb urine dipstick: NEGATIVE
Ketones, ur: NEGATIVE mg/dL
Nitrite: NEGATIVE
Protein, ur: >=300 mg/dL — AB
Specific Gravity, Urine: 1.011 (ref 1.005–1.030)
pH: 8 (ref 5.0–8.0)

## 2022-05-20 LAB — CBC
HCT: 38.7 % — ABNORMAL LOW (ref 39.0–52.0)
Hemoglobin: 12.1 g/dL — ABNORMAL LOW (ref 13.0–17.0)
MCH: 27.8 pg (ref 26.0–34.0)
MCHC: 31.3 g/dL (ref 30.0–36.0)
MCV: 88.8 fL (ref 80.0–100.0)
Platelets: 238 10*3/uL (ref 150–400)
RBC: 4.36 MIL/uL (ref 4.22–5.81)
RDW: 16.4 % — ABNORMAL HIGH (ref 11.5–15.5)
WBC: 5.2 10*3/uL (ref 4.0–10.5)
nRBC: 0 % (ref 0.0–0.2)

## 2022-05-20 LAB — LIPASE, BLOOD: Lipase: 82 U/L — ABNORMAL HIGH (ref 11–51)

## 2022-05-20 LAB — HEPATITIS B SURFACE ANTIBODY,QUALITATIVE: Hep B S Ab: NONREACTIVE

## 2022-05-20 LAB — HEPATITIS B SURFACE ANTIGEN: Hepatitis B Surface Ag: NONREACTIVE

## 2022-05-20 LAB — HEPATITIS B CORE ANTIBODY, TOTAL: Hep B Core Total Ab: NONREACTIVE

## 2022-05-20 MED ORDER — OXYCODONE-ACETAMINOPHEN 5-325 MG PO TABS
1.0000 | ORAL_TABLET | Freq: Once | ORAL | Status: AC
  Administered 2022-05-20: 1 via ORAL
  Filled 2022-05-20: qty 1

## 2022-05-20 MED ORDER — LABETALOL HCL 5 MG/ML IV SOLN
10.0000 mg | Freq: Once | INTRAVENOUS | Status: AC
  Administered 2022-05-20: 10 mg via INTRAVENOUS
  Filled 2022-05-20: qty 4

## 2022-05-20 MED ORDER — ONDANSETRON 4 MG PO TBDP
4.0000 mg | ORAL_TABLET | Freq: Once | ORAL | Status: AC | PRN
  Administered 2022-05-20: 4 mg via ORAL

## 2022-05-20 MED ORDER — ONDANSETRON 4 MG PO TBDP
8.0000 mg | ORAL_TABLET | Freq: Once | ORAL | Status: AC
  Administered 2022-05-20: 8 mg via ORAL
  Filled 2022-05-20: qty 2

## 2022-05-20 MED ORDER — CHLORHEXIDINE GLUCONATE CLOTH 2 % EX PADS: 6.0000 | MEDICATED_PAD | Freq: Every day | CUTANEOUS | Status: DC

## 2022-05-20 NOTE — ED Notes (Addendum)
This RN gave report to [place] with dialysis - pt to come to dialysis now, will be coming back to the ER after dialysis - Charge RN aware

## 2022-05-20 NOTE — Progress Notes (Signed)
Informed of patient in ER. Presents with abd pain/v/d. Apparently happens when he misses dialysis. Is noncompliant with dialysis. Will arrange for HD but won't be done until tonight/overnight. He is open for dialysis at his outpatient HD unit tomorrow which he can directly go to provided he is not being admitted. Please call if patient is to be admitted for formal consultation.  Outpatient HD orders: [facility], TTS. 4hrs. F180. Flows 400/600. 2k/3cal. AVF & TDC. Meds: heparin 2500 units bolus.   Dialysis ordered just in case he is staying. Will utilize [facility] for ease if that is the case.  Gean Quint, MD [facility]

## 2022-05-20 NOTE — ED Notes (Signed)
Meal given

## 2022-05-20 NOTE — ED Provider Notes (Signed)
[facility] EMERGENCY DEPARTMENT Provider Note   CSN: [id number] Arrival date & time: 05/20/22  0257     History  Chief Complaint  Patient presents with   Abdominal Pain    Stanley [name] is a 46 y.o. male with medical history of end-stage renal disease on dialysis, polysubstance abuse, anxiety, depression, heart palpitations, hypertension.  The patient presents to the ED for evaluation of abdominal pain, nausea, vomiting and diarrhea.  The patient states that the symptoms began 2 days ago however acutely worsened last night.  Patient reports that Stanley [name] is a dialysis patient, goes on Tuesday, Thursday, Saturday.  The patient states that Stanley [name] missed his dialysis session yesterday.  The patient states that this is typically how Stanley [name] feels after missing dialysis.  Patient states that Stanley [name] was unable to make his dialysis session because Stanley [name] was "driving around and could not make it".  The patient is also endorsing fevers, nausea, vomiting, diarrhea.   Abdominal Pain Associated symptoms: diarrhea, fever, nausea and vomiting        Home Medications Prior to Admission medications   Medication Sig Start Date End Date Taking? Authorizing Provider  acetaminophen (TYLENOL) 500 MG tablet Take 1,000 mg by mouth as needed for mild pain.    [provider]  albuterol (PROAIR HFA) 108 (90 Base) MCG/ACT inhaler Inhale 1-2 puffs into the lungs every 6 (six) hours as needed for wheezing or shortness of breath. 03/30/22   Argentina Donovan, PA-C  amLODipine (NORVASC) 10 MG tablet Take 1 tablet (10 mg total) by mouth daily. 03/30/22 09/26/22  Argentina Donovan, PA-C  atorvastatin (LIPITOR) 40 MG tablet Take 1 tablet (40 mg total) by mouth daily. 03/30/22   Argentina Donovan, PA-C  calcium acetate (PHOSLO) 667 MG tablet Take 1 tablet (667 mg total) by mouth 3 (three) times daily with meals. 08/15/21   Elsie Stain, MD  cloNIDine (CATAPRES) 0.2 MG tablet Take 1 tablet (0.2 mg total) by mouth  3 (three) times daily. 03/30/22 09/26/22  Argentina Donovan, PA-C  folic acid (FOLVITE) 664 MCG tablet Take 0.5 tablets (400 mcg total) by mouth daily. 03/10/20   [name], FNP  hydrALAZINE (APRESOLINE) 100 MG tablet TAKE 1 TABLET BY MOUTH THREE TIMES DAILY. Please keep upcoming appt in May 2023 with Dr. Marlou Porch before anymore refills. Thank you Final Attempt 03/30/22   Argentina Donovan, PA-C  Melatonin 10 MG CAPS Take 1 tablet by mouth daily.    [provider]  Multiple Vitamin (MULTIVITAMIN WITH MINERALS) TABS tablet Take 1 tablet by mouth daily.    [provider]  ondansetron (ZOFRAN ODT) 4 MG disintegrating tablet Take 1 tablet (4 mg total) by mouth every 8 (eight) hours as needed for nausea or vomiting. Patient not taking: Reported on 03/30/2022 03/29/21   Elsie Stain, MD  traZODone (DESYREL) 100 MG tablet Take 1 tablet (100 mg total) by mouth at bedtime. 03/30/22 09/26/22  Argentina Donovan, PA-C      Allergies    Lactose intolerance (gi), Nitroglycerin, Other, and Isosorbide    Review of Systems   Review of Systems  Constitutional:  Positive for fever.  Gastrointestinal:  Positive for abdominal pain, diarrhea, nausea and vomiting.  All other systems reviewed and are negative.   Physical Exam Updated Vital Signs BP (!) 214/120   Pulse 81   Temp 97.8 F (36.6 C)   Resp 14   SpO2 97%  Physical Exam Vitals and  nursing note reviewed.  Constitutional:      General: Stanley [name] is not in acute distress.    Appearance: Normal appearance. Stanley [name] is not ill-appearing, toxic-appearing or diaphoretic.  HENT:     Head: Normocephalic and atraumatic.     Nose: Nose normal. No congestion.     Mouth/Throat:     Mouth: Mucous membranes are moist.     Pharynx: Oropharynx is clear.  Eyes:     Extraocular Movements: Extraocular movements intact.     Conjunctiva/sclera: Conjunctivae normal.     Pupils: Pupils are equal, round, and reactive to light.  Abdominal:      General: Abdomen is flat. Bowel sounds are normal.     Palpations: Abdomen is soft.  Skin:    General: Skin is warm and dry.     Capillary Refill: Capillary refill takes less than 2 seconds.  Neurological:     General: No focal deficit present.     Mental Status: Stanley [name] is alert and oriented to person, place, and time.     GCS: GCS eye subscore is 4. GCS verbal subscore is 5. GCS motor subscore is 6.     Cranial Nerves: Cranial nerves 2-12 are intact. No cranial nerve deficit.     Sensory: Sensation is intact. No sensory deficit.     Motor: Motor function is intact. No weakness.     Coordination: Coordination is intact. Heel to [facility] Test normal.     ED Results / Procedures / Treatments   Labs (all labs ordered are listed, but only abnormal results are displayed) Labs Reviewed  LIPASE, BLOOD - Abnormal; Notable for the following components:      Result Value   Lipase 82 (*)    All other components within normal limits  COMPREHENSIVE METABOLIC PANEL - Abnormal; Notable for the following components:   Chloride 96 (*)    BUN 64 (*)    Creatinine, Ser 15.87 (*)    Calcium 8.0 (*)    AST 12 (*)    GFR, Estimated 3 (*)    Anion gap 18 (*)    All other components within normal limits  CBC - Abnormal; Notable for the following components:   Hemoglobin 12.1 (*)    HCT 38.7 (*)    RDW 16.4 (*)    All other components within normal limits  URINALYSIS, ROUTINE W REFLEX MICROSCOPIC - Abnormal; Notable for the following components:   APPearance HAZY (*)    Protein, ur >=300 (*)    Leukocytes,Ua SMALL (*)    Bacteria, UA RARE (*)    All other components within normal limits    EKG None  Radiology CT ABDOMEN PELVIS WO CONTRAST  Result Date: 05/20/2022 CLINICAL DATA:  LEFT lower quadrant abdominal pain with vomiting and diarrhea since last night EXAM: CT ABDOMEN AND PELVIS WITHOUT CONTRAST TECHNIQUE: Multidetector CT imaging of the abdomen and pelvis was performed following the standard  protocol without IV contrast. RADIATION DOSE REDUCTION: This exam was performed according to the departmental dose-optimization program which includes automated exposure control, adjustment of the mA and/or kV according to patient size and/or use of iterative reconstruction technique. COMPARISON:  None FINDINGS: Lower chest: Lung bases clear Hepatobiliary: Gallbladder and liver normal appearance Pancreas: Normal appearance Spleen: Normal appearance Adrenals/Urinary Tract: Adrenal glands normal appearance. 9 mm RIGHT renal cyst; no follow-up imaging recommended. Kidneys, ureters, and bladder otherwise normal appearance. No urinary tract calcification or dilatation. Stomach/Bowel: Normal appendix. Single uncomplicated diverticulum at distal descending colon. Stomach  and bowel loops otherwise normal appearance Vascular/Lymphatic: Atherosclerotic calcifications aorta and iliac arteries somewhat prominent for age. No adenopathy. Reproductive: Unremarkable prostate gland and seminal vesicles Other: Small RIGHT inguinal hernia containing fat. No free air or free fluid. No inflammatory process. Musculoskeletal: Advanced degenerative changes of LEFT hip joint with marked joint space narrowing and bone-on-bone appearance, subchondral cystic changes and femoral head spur formation. Associated moderate LEFT hip joint effusion. Degenerative disc disease changes L4-L5 with grade 1 anterolisthesis and BILATERAL spondylolysis L4. IMPRESSION: Advanced degenerative changes of LEFT hip joint with associated moderate LEFT hip joint effusion. If there is clinical concern for septic arthritis of the LEFT hip, consider arthrocentesis Degenerative disc disease changes L4-L5 with grade 1 anterolisthesis due to BILATERAL spondylolysis L4. Minimal descending colonic diverticulosis without evidence of diverticulitis. Small RIGHT inguinal hernia containing fat. No acute intra-abdominal or intrapelvic abnormalities. Aortic Atherosclerosis  (ICD10-I70.0). Electronically Signed   By: Lavonia Dana M.D.   On: 05/20/2022 15:01    Procedures Procedures   Medications Ordered in ED Medications  ondansetron (ZOFRAN-ODT) disintegrating tablet 4 mg (4 mg Oral Given 05/20/22 0331)  oxyCODONE-acetaminophen (PERCOCET/ROXICET) 5-325 MG per tablet 1 tablet (1 tablet Oral Given 05/20/22 0332)  ondansetron (ZOFRAN-ODT) disintegrating tablet 8 mg (8 mg Oral Given 05/20/22 1551)  labetalol (NORMODYNE) injection 10 mg (10 mg Intravenous Given 05/20/22 1602)    ED Course/ Medical Decision Making/ A&P                           Medical Decision Making Amount and/or Complexity of Data Reviewed Labs: ordered. Radiology: ordered.  Risk Prescription drug management.   45 year old male presents to ED for evaluation.  Please see HPI for further details.  On examination, the patient is afebrile and nontachycardic.  The patient lung sounds are clear bilaterally, Stanley [name] is not hypoxic on room air.  Patient abdomen is soft and compressible however the patient does endorse focal tenderness in his epigastric area.  Patient neurological examination shows no focal neurodeficits.  Patient states that Stanley [name] missed dialysis yesterday.  After discussion, I asked the patient if his main goal of care today was to receive dialysis.  The patient stated yes.  The patient states that how Stanley [name] feels today is typically how Stanley [name] feels after missing dialysis session.  The patient denies any new features about today's presentation.  I consulted with on-call nephrologist who stated that they would be able to fit this patient in tonight for dialysis.  The on-call nephrologist stated that this patient did not seem volume overloaded, was not hyperkalemic, then Stanley [name] could also follow-up tomorrow with his dialysis center.  Shared decision making conversation was had with the patient, the patient states that Stanley [name] would prefer to just follow-up with his dialysis center tomorrow morning.  Patient  worked up utilizing the following labs and imaging studies interpreted by me personally: - CMP with elevated creatinine of 15.87 however patient does have end-stage renal disease, has missed dialysis session recently - Urinalysis shows protein, small leukocytes, rare bacteria - CBC with no leukocytosis, slightly decreased hemoglobin at 12.1 - Lipase at 82 - CT abdomen pelvis does show a joint effusion and the patient left hip, colonic diverticulosis without evidence of diverticulitis, small right-sided inguinal hernia  The patient states that Stanley [name] has had left-sided hip pain for over 1 year now.  The patient denies any new or associated features with today's episode of left hip pain.  Patient reports that Stanley [name]  has followed up with numerous specialist about his left hip pain however has never followed through with plan of action as Stanley [name] is unable to follow-up due to lack of insurance.  The patient is afebrile, nontachycardic, no white blood cell count elevation.  Due to this, no concern for septic arthritis.  The patient be advised to follow-up with his PCP for this.  Prior to discharge, the patient was noted to be hypertensive at 215/120.  The patient will be given 10 mg labetalol and his blood pressure will be evaluated.  The patient will be signed out to my colleague Soijette Blue PA-C for further management.  Plan of management has been discussed with her.  Patient stable.  Final Clinical Impression(s) / ED Diagnoses Final diagnoses:  Epigastric pain  Nausea vomiting and diarrhea    Rx / DC Orders ED Discharge Orders     None         Azucena Cecil, PA-C 05/20/22 1613    Sherwood Gambler, MD 05/23/22 [phone]

## 2022-05-20 NOTE — Discharge Instructions (Signed)
It is important that you go to your dialysis appointment tomorrow for dialysis. You may follow up with your primary care provider as needed. Return to the ED if you are experiencing increasing/worsening abdominal pain, nausea, vomiting, or worsening symptoms.  [place] (women) Services: provides shelter for women without dependents Phone: [phone] Fax: [phone] Address: [address] [place], [place] [address] Contact: Rexanne Mano, (Director) [employer] (men, women & children) Teacher, adult education Phone: [phone]  Address: [address], [place], [place] [address] Contact: Arlan Organ, Civil engineer, contracting)  Chief Operating Officer -[employer]:  provides food pantry and hot meals   (7:30-8:30 am; 11-12:30 pm; 6-7 pm) Phone: [phone] Fax: [phone] Address: [place], [place] Contact: Doctor, general practice Agricultural consultant) ext [phone]   [employer] (Women and families only -  no single men allowed) First 14 days emergency shelter, then can request  long term shelter.After that - $25/week (individual male), $35/week (families) Phone: [phone]  [address] [place] [address] Mikael Spray (Manager), [phone]  [facility] Transitional housing (male/male) 18-23 [place], [place] [address] [phone] Family Services- domestic violence shelter [phone]   [facility] for families; must be a [place] resident [phone]  Fax:  [phone] Address:  [place], [place] [address] Contact: Carlynn Spry, or Rev. Dalbert Garnet (Director) - [phone] [place]: transitional housing Phone: [phone] Fax: [place], [place] [address] Jen Reid (Director); Tressia Miners Chief Strategy Officer)  [place]:   provides shelter for adult men and women Phone:  [phone]  Fax:  [phone] Address:  [address] [place], [place] [address] Housing Case Manager Gilberto Better [phone]), or Rev. Dalbert Garnet (Director) - [phone] [facility] Application fee and monthly rent (varies for each individual) Services:  Residential program for ex-offenders Phone:  [phone]  Address:  [address] [place], [place] [address] Contact: Derrell Lolling ([phone])  [employer] [employer]  provides shelter, food, clothing, and other supportive services for homeless young adults in [place], (ages 18-23) Phone:  [phone]  Fax #:  [phone] Address:  [address] [place], [place] [address] Contact: Rev. Darlina Sicilian (Development worker, international aid), Rev. Chaya Jan (Program Director) [employer]   Provides services for the homeless, including filing disability; helps with housing and grocery assistance- disabled Veterans [phone] Fax: [phone]   [address] [place], [place] [address] Contact: Irena Reichmann (ext. 64)  Room at the [facility]  provides services to homeless, pregnant [place] residents of any age. Clients may have other children  Phone:  [phone] Fax: [phone] Address:  [address], [place] [address] Contact: Ashley Jacobs [facility]) [facility] [place], [place]    [place] 18-21 Supportive Housing 18-24 Maternity Housing 16-21 [address], [place], [place] [address] [phone] Fax [phone]   Family Services- domestic violence shelter [phone]   [facility] [place]  Adult, emergency food assistance, [facility] for family shelter Phone: [phone] Address: [address], [place], [place] [address]  [facility]  provides [place] approved housing assistance programs Phone: [phone] [place], [place],  [place] [address]  [place] with housing, 2nd chance program for offenders, domestic violence program Phone:  [place] Men in [place], rehab services also provided  Phone: [phone] Address: [address] [place], [place] [address] [facility]  Services: Emergency day shelter only  Phone: [phone] Address: [address] [place], [place] [address]  Room at the Ali [place], [place]   [facility]   [place] and shelter, teaches independent skills Phone:  [phone]  Fax:  [phone] Address:  [address] [place] [address] Sonjia Kurosky ([place] Director), Arville Lime (Asst Director) [facility] for the [place], teaches independent skills Phone:  [phone]  Fax:  [phone] [place], [place] [address] Nelva Bush ([place]), Esmond Camper (Director)  [employer]:  Family shelter Phone:  [phone] Address:  [place], [place] [address]   [place] at the [place], [place] [address] [place]: Ulla Gallo [phone], ext [phone]) or Contact: Linton Rump [phone], ext. [phone]) [facility]- men Services:  housing for homeless/abused a substance in the last 30 days  [phone], ext. [phone] [place], [place], [place] [address] Contact: Lajean Saver, ext. [phone] Insurance account manager)  [employer]- women housing for [employer] abused a substance in the last 7 days [phone], ext. [phone] Fax: [phone] Address: [address] [place], [place] [address] Contact: Dayna Barker, ext 114 Insurance account manager) [facility] Emergency shelter, income/employment and housing services Phone: [phone] Address: [place], [place] [address]   [place]: [employer], basic services Phone: [phone] Address: [place], [place] [address]   [facility] ([email or web address].[email or web address]) For veterans who are homeless or on the verge (will make referrals) [phone] 1-877-4AID-VET  Fax: [phone] Address:  [address] [place], [place] [address] [place]: Overnight shelter Phone: [phone] Address: [address] [place],  [address]

## 2022-05-20 NOTE — ED Notes (Signed)
PA aware of pt continued hypertension

## 2022-05-20 NOTE — ED Triage Notes (Signed)
Patient reports LLQ pain with emesis and diarrhea onset last night .

## 2022-05-20 NOTE — ED Provider Notes (Signed)
Care transferred from Astrid Drafts, PA-C at time of sign out. See their note for full assessment.   Briefly: Patient is 46 y.o. male with history of ESRD (on dialysis MWF), HTN, who presents to the ED with concerns for abdominal pain.    Plan: Plan per previous PA-C: pending improvement of blood pressure with labetalol in the ED.  Clinical Course as of 05/20/22 2213  Fri May 20, 2022  1639 Updated patient with treatment plan. Pt agreeable at this time.  [SB]  1929 Consult with hospitalist, Dr. Alcario Drought who recommends dialysis. If blood pressure not improved status post dialysis, then patient can be considered for admission for elevated blood pressure. [SB]  2010 Consult with nephrologist, Dr. Hollie Salk who recommends hemodialysis. [SB]  2012 Discussed with patient regarding plans for dialysis in the hospital tonight.  Patient agreeable at this time. [SB]  2210 Patient transferred by RN to dialysis at this time. [SB]    Clinical Course User Index [SB] Chalsey Leeth A, PA-C     Patient to receive hemodialysis tonight in the hospital.  If hypertension improved with hemodialysis, patient can be safely discharged home if asymptomatic at that time.  If patient still with elevated blood pressures status post and hemodialysis, patient will be considered for admission to the hospital due to elevated blood pressure.  Discussed plan with patient at bedside of hemodialysis in the hospital, patient agreeable at this time.  Patient appears safe for hemodialysis at this time.     This chart was dictated using voice recognition software, Dragon. Despite the best efforts of this provider to proofread and correct errors, errors may still occur which can change documentation meaning.   Peter Keyworth A, PA-C 05/20/22 2218    Cristie Hem, MD 05/21/22 [phone]

## 2022-05-21 ENCOUNTER — Encounter (HOSPITAL_COMMUNITY): Payer: Self-pay | Admitting: Internal Medicine

## 2022-05-21 DIAGNOSIS — Z992 Dependence on renal dialysis: Secondary | ICD-10-CM

## 2022-05-21 DIAGNOSIS — N186 End stage renal disease: Secondary | ICD-10-CM | POA: Diagnosis not present

## 2022-05-21 DIAGNOSIS — R1013 Epigastric pain: Secondary | ICD-10-CM | POA: Diagnosis not present

## 2022-05-21 DIAGNOSIS — I160 Hypertensive urgency: Secondary | ICD-10-CM | POA: Diagnosis not present

## 2022-05-21 DIAGNOSIS — R197 Diarrhea, unspecified: Secondary | ICD-10-CM

## 2022-05-21 DIAGNOSIS — E785 Hyperlipidemia, unspecified: Secondary | ICD-10-CM

## 2022-05-21 DIAGNOSIS — R112 Nausea with vomiting, unspecified: Secondary | ICD-10-CM

## 2022-05-21 DIAGNOSIS — Z5900 Homelessness unspecified: Secondary | ICD-10-CM | POA: Diagnosis not present

## 2022-05-21 LAB — CBC
HCT: 35.1 % — ABNORMAL LOW (ref 39.0–52.0)
Hemoglobin: 10.9 g/dL — ABNORMAL LOW (ref 13.0–17.0)
MCH: 27.7 pg (ref 26.0–34.0)
MCHC: 31.1 g/dL (ref 30.0–36.0)
MCV: 89.3 fL (ref 80.0–100.0)
Platelets: 214 10*3/uL (ref 150–400)
RBC: 3.93 MIL/uL — ABNORMAL LOW (ref 4.22–5.81)
RDW: 16.1 % — ABNORMAL HIGH (ref 11.5–15.5)
WBC: 3.7 10*3/uL — ABNORMAL LOW (ref 4.0–10.5)
nRBC: 0 % (ref 0.0–0.2)

## 2022-05-21 LAB — BASIC METABOLIC PANEL
Anion gap: 14 (ref 5–15)
BUN: 35 mg/dL — ABNORMAL HIGH (ref 6–20)
CO2: 27 mmol/L (ref 22–32)
Calcium: 7.8 mg/dL — ABNORMAL LOW (ref 8.9–10.3)
Chloride: 97 mmol/L — ABNORMAL LOW (ref 98–111)
Creatinine, Ser: 9.94 mg/dL — ABNORMAL HIGH (ref 0.61–1.24)
GFR, Estimated: 6 mL/min — ABNORMAL LOW (ref >60)
Glucose, Bld: 124 mg/dL — ABNORMAL HIGH (ref 70–99)
Potassium: 3.8 mmol/L (ref 3.5–5.1)
Sodium: 138 mmol/L (ref 135–145)

## 2022-05-21 LAB — HIV ANTIBODY (ROUTINE TESTING W REFLEX): HIV Screen 4th Generation wRfx: NONREACTIVE

## 2022-05-21 LAB — PHOSPHORUS: Phosphorus: 5.3 mg/dL — ABNORMAL HIGH (ref 2.5–4.6)

## 2022-05-21 MED ORDER — ATORVASTATIN CALCIUM 40 MG PO TABS
40.0000 mg | ORAL_TABLET | Freq: Every day | ORAL | Status: DC
  Administered 2022-05-21 – 2022-05-22 (×2): 40 mg via ORAL
  Filled 2022-05-21 (×2): qty 1

## 2022-05-21 MED ORDER — HEPARIN SODIUM (PORCINE) 1000 UNIT/ML DIALYSIS
1000.0000 [IU] | INTRAMUSCULAR | Status: DC | PRN
  Filled 2022-05-21: qty 1

## 2022-05-21 MED ORDER — ALTEPLASE 2 MG IJ SOLR
2.0000 mg | Freq: Once | INTRAMUSCULAR | Status: DC | PRN
Start: 2022-05-21 — End: 2022-05-21

## 2022-05-21 MED ORDER — CLONIDINE HCL 0.2 MG PO TABS: 0.2000 mg | ORAL_TABLET | Freq: Three times a day (TID) | ORAL | Status: DC

## 2022-05-21 MED ORDER — AMLODIPINE BESYLATE 5 MG PO TABS: 10.0000 mg | ORAL_TABLET | Freq: Every day | ORAL | Status: DC

## 2022-05-21 MED ORDER — LABETALOL HCL 5 MG/ML IV SOLN: 5.0000 mg | INTRAVENOUS | Status: DC | PRN

## 2022-05-21 MED ORDER — HYDRALAZINE HCL 50 MG PO TABS
100.0000 mg | ORAL_TABLET | Freq: Three times a day (TID) | ORAL | Status: DC
  Administered 2022-05-21 – 2022-05-22 (×5): 100 mg via ORAL
  Filled 2022-05-21 (×5): qty 2

## 2022-05-21 MED ORDER — ACETAMINOPHEN 650 MG RE SUPP: 650.0000 mg | Freq: Four times a day (QID) | RECTAL | Status: DC | PRN

## 2022-05-21 MED ORDER — FOLIC ACID 1 MG PO TABS
1.0000 mg | ORAL_TABLET | Freq: Every day | ORAL | Status: DC
  Administered 2022-05-21 – 2022-05-22 (×2): 1 mg via ORAL
  Filled 2022-05-21 (×2): qty 1

## 2022-05-21 MED ORDER — CLONIDINE HCL 0.1 MG PO TABS
0.1000 mg | ORAL_TABLET | Freq: Once | ORAL | Status: AC
  Administered 2022-05-21: 0.1 mg via ORAL
  Filled 2022-05-21: qty 1

## 2022-05-21 MED ORDER — MELATONIN 5 MG PO TABS
10.0000 mg | ORAL_TABLET | Freq: Every day | ORAL | Status: DC
Start: 2022-05-21 — End: 2022-05-22
  Administered 2022-05-22: 10 mg via ORAL
  Filled 2022-05-21: qty 2

## 2022-05-21 MED ORDER — ONDANSETRON HCL 4 MG/2ML IJ SOLN: 4.0000 mg | Freq: Four times a day (QID) | INTRAMUSCULAR | Status: DC | PRN

## 2022-05-21 MED ORDER — PENTAFLUOROPROP-TETRAFLUOROETH EX AERO: 1.0000 | INHALATION_SPRAY | CUTANEOUS | Status: DC | PRN

## 2022-05-21 MED ORDER — TRAZODONE HCL 50 MG PO TABS
100.0000 mg | ORAL_TABLET | Freq: Every day | ORAL | Status: DC
  Administered 2022-05-22: 100 mg via ORAL
  Filled 2022-05-21: qty 2

## 2022-05-21 MED ORDER — ONDANSETRON HCL 4 MG PO TABS: 4.0000 mg | ORAL_TABLET | Freq: Four times a day (QID) | ORAL | Status: DC | PRN

## 2022-05-21 MED ORDER — CLONIDINE HCL 0.2 MG PO TABS
0.3000 mg | ORAL_TABLET | Freq: Three times a day (TID) | ORAL | Status: DC
  Administered 2022-05-21 – 2022-05-22 (×4): 0.3 mg via ORAL
  Filled 2022-05-21 (×4): qty 1

## 2022-05-21 MED ORDER — NEPRO/CARBSTEADY PO LIQD
237.0000 mL | Freq: Two times a day (BID) | ORAL | Status: DC
  Administered 2022-05-22 (×2): 237 mL via ORAL

## 2022-05-21 MED ORDER — HEPARIN SODIUM (PORCINE) 1000 UNIT/ML DIALYSIS: 1000.0000 [IU] | INTRAMUSCULAR | Status: DC | PRN

## 2022-05-21 MED ORDER — HEPARIN SODIUM (PORCINE) 1000 UNIT/ML DIALYSIS
2000.0000 [IU] | INTRAMUSCULAR | Status: DC | PRN
  Filled 2022-05-21: qty 2

## 2022-05-21 MED ORDER — HEPARIN SODIUM (PORCINE) 1000 UNIT/ML IJ SOLN
INTRAMUSCULAR | Status: AC
  Filled 2022-05-21: qty 4

## 2022-05-21 MED ORDER — LABETALOL HCL 5 MG/ML IV SOLN: 10.0000 mg | INTRAVENOUS | Status: DC | PRN

## 2022-05-21 MED ORDER — LIDOCAINE-PRILOCAINE 2.5-2.5 % EX CREA: 1.0000 | TOPICAL_CREAM | CUTANEOUS | Status: DC | PRN

## 2022-05-21 MED ORDER — AMLODIPINE BESYLATE 10 MG PO TABS
10.0000 mg | ORAL_TABLET | Freq: Every day | ORAL | Status: DC
  Administered 2022-05-21 – 2022-05-22 (×2): 10 mg via ORAL
  Filled 2022-05-21: qty 2
  Filled 2022-05-21: qty 1

## 2022-05-21 MED ORDER — ANTICOAGULANT SODIUM CITRATE 4% (200MG/5ML) IV SOLN
5.0000 mL | Status: DC | PRN
  Filled 2022-05-21: qty 5

## 2022-05-21 MED ORDER — ACETAMINOPHEN 325 MG PO TABS: 650.0000 mg | ORAL_TABLET | Freq: Four times a day (QID) | ORAL | Status: DC | PRN

## 2022-05-21 MED ORDER — CHLORHEXIDINE GLUCONATE CLOTH 2 % EX PADS
6.0000 | MEDICATED_PAD | Freq: Every day | CUTANEOUS | Status: DC
  Administered 2022-05-22: 6 via TOPICAL

## 2022-05-21 MED ORDER — HEPARIN SODIUM (PORCINE) 5000 UNIT/ML IJ SOLN
5000.0000 [IU] | Freq: Three times a day (TID) | INTRAMUSCULAR | Status: DC
Start: 2022-05-21 — End: 2022-05-22
  Administered 2022-05-21 – 2022-05-22 (×2): 5000 [IU] via SUBCUTANEOUS
  Filled 2022-05-21 (×3): qty 1

## 2022-05-21 MED ORDER — CALCITRIOL 0.25 MCG PO CAPS: 1.0000 ug | ORAL_CAPSULE | ORAL | Status: DC

## 2022-05-21 MED ORDER — FOLIC ACID 800 MCG PO TABS: 400.0000 ug | ORAL_TABLET | Freq: Every day | ORAL | Status: DC

## 2022-05-21 MED ORDER — ADULT MULTIVITAMIN W/MINERALS CH
1.0000 | ORAL_TABLET | Freq: Every day | ORAL | Status: DC
  Administered 2022-05-21 – 2022-05-22 (×2): 1 via ORAL
  Filled 2022-05-21 (×2): qty 1

## 2022-05-21 MED ORDER — MELATONIN 5 MG PO TABS: 10.0000 mg | ORAL_TABLET | Freq: Every day | ORAL | Status: DC

## 2022-05-21 MED ORDER — ALBUTEROL SULFATE (2.5 MG/3ML) 0.083% IN NEBU: 2.5000 mg | INHALATION_SOLUTION | Freq: Four times a day (QID) | RESPIRATORY_TRACT | Status: DC | PRN

## 2022-05-21 MED ORDER — CARVEDILOL 6.25 MG PO TABS
6.2500 mg | ORAL_TABLET | Freq: Two times a day (BID) | ORAL | Status: DC
Start: 2022-05-21 — End: 2022-05-22
  Administered 2022-05-21 (×2): 6.25 mg via ORAL
  Filled 2022-05-21: qty 1
  Filled 2022-05-21: qty 2
  Filled 2022-05-21: qty 1

## 2022-05-21 MED ORDER — CALCIUM ACETATE (PHOS BINDER) 667 MG PO CAPS
667.0000 mg | ORAL_CAPSULE | Freq: Three times a day (TID) | ORAL | Status: DC
  Administered 2022-05-21 – 2022-05-22 (×5): 667 mg via ORAL
  Filled 2022-05-21 (×5): qty 1

## 2022-05-21 MED ORDER — LIDOCAINE HCL (PF) 1 % IJ SOLN: 5.0000 mL | INTRAMUSCULAR | Status: DC | PRN

## 2022-05-21 MED ORDER — PNEUMOCOCCAL 20-VAL CONJ VACC 0.5 ML IM SUSY
0.5000 mL | PREFILLED_SYRINGE | INTRAMUSCULAR | Status: AC
  Administered 2022-05-22: 0.5 mL via INTRAMUSCULAR
  Filled 2022-05-21 (×2): qty 0.5
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
## 2022-05-21 NOTE — Progress Notes (Signed)
Progress Note    Stanley [name]   QPR:[id number]  DOB: Sep 18, 1976  DOA: 05/20/2022     0 PCP: Stanley Stain, MD  Initial CC: elevated BP, missed HD  Hospital Course: Stanley [name] is a 46 yo male with PMH ESRD on TTS HD, HTN, depression/anxiety, HLD, CVA, tobacco use who presented to the ER originally with abdominal pain.  He also had some nausea/vomiting and diarrhea.  He had missed dialysis on Thursday and presented to the hospital on Friday after also overheating outside due to living in his car. On workup in the ER he had excessively elevated BP and was resumed back on home meds. He also underwent HD session the evening of 05/20/22.   Interval History:  Seen this morning in the ER.  He was feeling a little better compared to when he came in.  Blood pressure still elevated above goal today.  Discussed some further medication changes with him. He confirms living in his car and feeling that he was overheating which prompted him to present to the ER.  He says he has checked some shelters and they have had no availability.  Assessment and Plan: * Hypertensive urgency - suspected due to missed HD session on 8/17 and possibly missed home meds - has been resumed on clonidine, hydralazine, amlodipine - BP still rather elevated this morning despite HD overnight - will increase clonidine today and also add coreg and monitor response further -He denies having difficulty affording his medications although still question compliance  Homeless - Social work consult to see if they have anything they can offer - states he is living in his car and that shelters he's checked are full   ESRD on dialysis [facility]) - s/p HD on evening of 8/18 - nephrology following as well - patient to get HD again today, 8/19 to get back on TThSa schedule  HLD (hyperlipidemia) - continue lipitor    Old records reviewed in assessment of this patient  Antimicrobials:   DVT prophylaxis:  heparin injection 5,000  Units Start: 05/21/22 0600   Code Status:   Code Status: Full Code  Mobility Assessment (last 72 hours)     Mobility Assessment   No documentation.           Barriers to discharge: None Disposition Plan: Home in 1 to 2 days Status is: Observation  Objective: Blood pressure (!) 174/100, pulse 78, temperature 98 F (36.7 C), temperature source Oral, resp. rate 20, SpO2 98 %.  Examination:  Physical Exam Constitutional:      General: He is not in acute distress.    Appearance: Normal appearance.  HENT:     Head: Normocephalic and atraumatic.     Mouth/Throat:     Mouth: Mucous membranes are moist.  Eyes:     Extraocular Movements: Extraocular movements intact.  Cardiovascular:     Rate and Rhythm: Normal rate and regular rhythm.     Heart sounds: Normal heart sounds.  Pulmonary:     Effort: Pulmonary effort is normal. No respiratory distress.     Breath sounds: Normal breath sounds. No wheezing.  Abdominal:     General: Bowel sounds are normal. There is no distension.     Palpations: Abdomen is soft.     Tenderness: There is no abdominal tenderness.  Musculoskeletal:        General: Normal range of motion.     Cervical back: Normal range of motion and neck supple.  Skin:    General:  Skin is warm and dry.  Neurological:     General: No focal deficit present.     Mental Status: He is alert.  Psychiatric:        Mood and Affect: Mood normal.        Behavior: Behavior normal.      Consultants:  Nephrology  Procedures:    Data Reviewed: Results for orders placed or performed during the hospital encounter of 05/20/22 (from the past 24 hour(s))  Hepatitis B surface antigen     Status: None   Collection Time: 05/20/22  9:30 PM  Result Value Ref Range   Hepatitis B Surface Ag NON REACTIVE NON REACTIVE  Hepatitis B surface antibody     Status: None   Collection Time: 05/20/22  9:30 PM  Result Value Ref Range   Hep B S Ab NON REACTIVE NON REACTIVE   Hepatitis B core antibody, total     Status: None   Collection Time: 05/20/22  9:30 PM  Result Value Ref Range   Hep B Core Total Ab NON REACTIVE NON REACTIVE  Hepatitis C antibody     Status: None   Collection Time: 05/20/22  9:30 PM  Result Value Ref Range   HCV Ab NON REACTIVE NON REACTIVE  HIV Antibody (routine testing w rflx)     Status: None   Collection Time: 05/21/22  5:09 AM  Result Value Ref Range   HIV Screen 4th Generation wRfx Non Reactive Non Reactive  CBC     Status: Abnormal   Collection Time: 05/21/22  5:09 AM  Result Value Ref Range   WBC 3.7 (L) 4.0 - 10.5 K/uL   RBC 3.93 (L) 4.22 - 5.81 MIL/uL   Hemoglobin 10.9 (L) 13.0 - 17.0 g/dL   HCT 35.1 (L) 39.0 - 52.0 %   MCV 89.3 80.0 - 100.0 fL   MCH 27.7 26.0 - 34.0 pg   MCHC 31.1 30.0 - 36.0 g/dL   RDW 16.1 (H) 11.5 - 15.5 %   Platelets 214 150 - 400 K/uL   nRBC 0.0 0.0 - 0.2 %  Basic metabolic panel     Status: Abnormal   Collection Time: 05/21/22  5:09 AM  Result Value Ref Range   Sodium 138 135 - 145 mmol/L   Potassium 3.8 3.5 - 5.1 mmol/L   Chloride 97 (L) 98 - 111 mmol/L   CO2 27 22 - 32 mmol/L   Glucose, Bld 124 (H) 70 - 99 mg/dL   BUN 35 (H) 6 - 20 mg/dL   Creatinine, Ser 9.94 (H) 0.61 - 1.24 mg/dL   Calcium 7.8 (L) 8.9 - 10.3 mg/dL   GFR, Estimated 6 (L) >60 mL/min   Anion gap 14 5 - 15    I have Reviewed nursing notes, Vitals, and Lab results since pt's last encounter. Pertinent lab results : see above I have ordered test including BMP, CBC, Mg I have reviewed the last note from staff over past 24 hours I have discussed pt's care plan and test results with nursing staff, case manager   LOS: 0 days   Stanley Dee, MD [facility] 05/21/2022, 12:18 PM

## 2022-05-21 NOTE — Progress Notes (Signed)
Pt's off the floor for dialysis.

## 2022-05-21 NOTE — ED Notes (Signed)
ED TO INPATIENT HANDOFF REPORT  ED Nurse Name and Phone #: Reef Achterberg *[phone]  S Name/Age/Gender Stanley [name] 46 y.o. male Room/Bed: 033C/033C  Code Status   Code Status: Full Code  Home/SNF/Other Home Patient oriented to: self, place, time, and situation Is this baseline? Yes   Triage Complete: Triage complete  Chief Complaint Epigastric pain [R10.13] Hypertensive urgency [I16.0] Nausea vomiting and diarrhea [R11.2, R19.7]  Triage Note Patient reports LLQ pain with emesis and diarrhea onset last night .    Allergies Allergies  Allergen Reactions   Lactose Intolerance (Gi) Nausea And Vomiting   Nitroglycerin Anxiety and Other (See Comments)    Pain and feels like he is on fire   Other Swelling    All beans   Isosorbide Itching and Other (See Comments)    Headaches    Level of Care/Admitting Diagnosis ED Disposition     ED Disposition  Admit   Condition  --   [place]: [facility] [[id number]]  Level of Care: Progressive [102]  Admit to Progressive based on following criteria: CARDIOVASCULAR & THORACIC of moderate stability with acute coronary syndrome symptoms/low risk myocardial infarction/hypertensive urgency/arrhythmias/heart failure potentially compromising stability and stable post cardiovascular intervention patients.  May place patient in observation at [facility] or [facility] if equivalent level of care is available:: No  Covid Evaluation: Asymptomatic - no recent exposure (last 10 days) testing not required  Diagnosis: Hypertensive urgency [017494]  Admitting Physician: Etta Quill [[id number]]  Attending Physician: Etta Quill [[id number]]          B Medical/Surgery History Past Medical History:  Diagnosis Date   Anxiety    Chronic kidney disease (CKD), stage V ([place]) 11/2020   CKD (chronic kidney disease), stage IV ([place])    Depression    Dyslexia    Dyspnea    Heart palpitations    High cholesterol    Hypertension     Hypertensive urgency    Polysubstance (including opioids) dependence, binge pattern ([place]) 08/2020   Stroke ([place]) 06/03/2017   "minor; completely recovered today" (06/13/2017)   Tobacco abuse    Past Surgical History:  Procedure Laterality Date   AV FISTULA PLACEMENT Left 09/29/2021   Procedure: LEFT RADIOCEPHALIC ARTERIOVENOUS (AV) FISTULA CREATION;  Surgeon: Cherre Robins, MD;  Location: [facility] [place];  Service: Vascular;  Laterality: Left;   INSERTION OF DIALYSIS CATHETER Right 09/25/2021   Procedure: INSERTION OF 19 sm [place];  Surgeon: Cherre Robins, MD;  Location: [facility];  Service: Vascular;  Laterality: Right;   NO PAST SURGERIES       A IV Location/Drains/Wounds Patient Lines/Drains/Airways Status     Active Line/Drains/Airways     Name Placement date Placement time Site Days   Peripheral IV 05/20/22 18 G Anterior;Right Wrist 05/20/22  1601  Wrist  1   Fistula / Graft Left Forearm Arteriovenous fistula 09/29/21  0812  Forearm  234   Hemodialysis Catheter Right Internal jugular Double lumen Permanent (Tunneled) 09/25/21  0836  Internal jugular  238   Hemodialysis Catheter 05/20/22  2246  --  1   Incision (Closed) 09/25/21 Neck Right 09/25/21  0833  -- 238   Incision (Closed) 09/29/21 Arm Left 09/29/21  0829  -- 234            Intake/Output Last 24 hours  Intake/Output Summary (Last 24 hours) at 05/21/2022 1525 Last data filed at 05/21/2022 0210 Gross per 24 hour  Intake --  Output 2100 ml  Net -2100 ml    Labs/Imaging Results for orders placed or performed during the hospital encounter of 05/20/22 (from the past 48 hour(s))  Lipase, blood     Status: Abnormal   Collection Time: 05/20/22  3:40 AM  Result Value Ref Range   Lipase 82 (H) 11 - 51 U/L    Comment: Performed at [facility] [address] [place], [place] [address]  Comprehensive metabolic panel     Status: Abnormal   Collection Time: 05/20/22  3:40 AM  Result  Value Ref Range   Sodium 139 135 - 145 mmol/L   Potassium 3.7 3.5 - 5.1 mmol/L   Chloride 96 (L) 98 - 111 mmol/L   CO2 25 22 - 32 mmol/L   Glucose, Bld 86 70 - 99 mg/dL    Comment: Glucose reference range applies only to samples taken after fasting for at least 8 hours.   BUN 64 (H) 6 - 20 mg/dL   Creatinine, Ser 15.87 (H) 0.61 - 1.24 mg/dL   Calcium 8.0 (L) 8.9 - 10.3 mg/dL   Total Protein 7.3 6.5 - 8.1 g/dL   Albumin 3.7 3.5 - 5.0 g/dL   AST 12 (L) 15 - 41 U/L   ALT 7 0 - 44 U/L   Alkaline Phosphatase 65 38 - 126 U/L   Total Bilirubin 0.7 0.3 - 1.2 mg/dL   GFR, Estimated 3 (L) >60 mL/min    Comment: (NOTE) Calculated using the CKD-EPI Creatinine Equation (2021)    Anion gap 18 (H) 5 - 15    Comment: Performed at [facility], [place] [address] [place], [place] [address]  CBC     Status: Abnormal   Collection Time: 05/20/22  3:40 AM  Result Value Ref Range   WBC 5.2 4.0 - 10.5 K/uL   RBC 4.36 4.22 - 5.81 MIL/uL   Hemoglobin 12.1 (L) 13.0 - 17.0 g/dL   HCT 38.7 (L) 39.0 - 52.0 %   MCV 88.8 80.0 - 100.0 fL   MCH 27.8 26.0 - 34.0 pg   MCHC 31.3 30.0 - 36.0 g/dL   RDW 16.4 (H) 11.5 - 15.5 %   Platelets 238 150 - 400 K/uL   nRBC 0.0 0.0 - 0.2 %    Comment: Performed at [facility], [place] [address], [place], [place] [address]  Urinalysis, Routine w reflex microscopic Urine, Clean Catch     Status: Abnormal   Collection Time: 05/20/22  3:52 AM  Result Value Ref Range   Color, Urine YELLOW YELLOW   APPearance HAZY (A) CLEAR   Specific Gravity, Urine 1.011 1.005 - 1.030   pH 8.0 5.0 - 8.0   Glucose, UA NEGATIVE NEGATIVE mg/dL   Hgb urine dipstick NEGATIVE NEGATIVE   Bilirubin Urine NEGATIVE NEGATIVE   Ketones, ur NEGATIVE NEGATIVE mg/dL   Protein, ur >=300 (A) NEGATIVE mg/dL   Nitrite NEGATIVE NEGATIVE   Leukocytes,Ua SMALL (A) NEGATIVE   RBC / HPF 0-5 0 - 5 RBC/hpf   WBC, UA 21-50 0 - 5 WBC/hpf   Bacteria, UA RARE (A) NONE SEEN   Squamous Epithelial / LPF 0-5 0  - 5   Mucus PRESENT    Sperm, UA PRESENT     Comment: Performed at [facility], [place] [address], [place], [place] [address]  Hepatitis B surface antigen     Status: None   Collection Time: 05/20/22  9:30 PM  Result Value Ref Range   Hepatitis B Surface Ag NON REACTIVE NON REACTIVE    Comment: Performed at [facility], [place] [address], [place], [place] [address]  Hepatitis B surface antibody     Status: None   Collection Time: 05/20/22  9:30 PM  Result Value Ref Range   Hep B S Ab NON REACTIVE NON REACTIVE    Comment: (NOTE) Inconsistent with immunity, less than 10 mIU/mL.  Performed at [facility], [place] [address] [place], [place] [address]   Hepatitis B core antibody, total     Status: None   Collection Time: 05/20/22  9:30 PM  Result Value Ref Range   Hep B Core Total Ab NON REACTIVE NON REACTIVE    Comment: Performed at [facility] [address], [place], [place] [address]  Hepatitis C antibody     Status: None   Collection Time: 05/20/22  9:30 PM  Result Value Ref Range   HCV Ab NON REACTIVE NON REACTIVE    Comment: (NOTE) Nonreactive HCV antibody screen is consistent with no HCV infections,  unless recent infection is suspected or other evidence exists to indicate HCV infection.  Performed at [facility], [place] [address], [place], [place] [address]   Phosphorus     Status: Abnormal   Collection Time: 05/21/22  5:06 AM  Result Value Ref Range   Phosphorus 5.3 (H) 2.5 - 4.6 mg/dL    Comment: Performed at [facility] [address] [place], [place] [address]  HIV Antibody (routine testing w rflx)     Status: None   Collection Time: 05/21/22  5:09 AM  Result Value Ref Range   HIV Screen 4th Generation wRfx Non Reactive Non Reactive    Comment: Performed at [facility], [place] [address] [place], [place] [address]  CBC     Status: Abnormal   Collection Time: 05/21/22  5:09 AM  Result Value Ref Range   WBC 3.7 (L) 4.0 -  10.5 K/uL   RBC 3.93 (L) 4.22 - 5.81 MIL/uL   Hemoglobin 10.9 (L) 13.0 - 17.0 g/dL   HCT 35.1 (L) 39.0 - 52.0 %   MCV 89.3 80.0 - 100.0 fL   MCH 27.7 26.0 - 34.0 pg   MCHC 31.1 30.0 - 36.0 g/dL   RDW 16.1 (H) 11.5 - 15.5 %   Platelets 214 150 - 400 K/uL   nRBC 0.0 0.0 - 0.2 %    Comment: Performed at [facility], [place] [address] [place], [place] [address]  Basic metabolic panel     Status: Abnormal   Collection Time: 05/21/22  5:09 AM  Result Value Ref Range   Sodium 138 135 - 145 mmol/L   Potassium 3.8 3.5 - 5.1 mmol/L   Chloride 97 (L) 98 - 111 mmol/L   CO2 27 22 - 32 mmol/L   Glucose, Bld 124 (H) 70 - 99 mg/dL    Comment: Glucose reference range applies only to samples taken after fasting for at least 8 hours.   BUN 35 (H) 6 - 20 mg/dL   Creatinine, Ser 9.94 (H) 0.61 - 1.24 mg/dL   Calcium 7.8 (L) 8.9 - 10.3 mg/dL   GFR, Estimated 6 (L) >60 mL/min    Comment: (NOTE) Calculated using the CKD-EPI Creatinine Equation (2021)    Anion gap 14 5 - 15    Comment: Performed at [facility]  [address], [place], [place] [address]   CT ABDOMEN PELVIS WO CONTRAST  Result Date: 05/20/2022 CLINICAL DATA:  LEFT lower quadrant abdominal pain with vomiting and diarrhea since last night EXAM: CT ABDOMEN AND PELVIS WITHOUT CONTRAST TECHNIQUE: Multidetector CT imaging of the abdomen and pelvis was performed following the standard protocol without IV contrast. RADIATION DOSE REDUCTION: This exam was performed according to the departmental dose-optimization program which includes automated exposure control, adjustment of the mA and/or kV according to patient size and/or use of iterative reconstruction technique. COMPARISON:  None FINDINGS: Lower chest: Lung bases clear Hepatobiliary: Gallbladder and liver normal appearance Pancreas: Normal appearance Spleen: Normal appearance Adrenals/Urinary Tract: Adrenal glands normal appearance. 9 mm RIGHT renal cyst; no follow-up imaging  recommended. Kidneys, ureters, and bladder otherwise normal appearance. No urinary tract calcification or dilatation. Stomach/Bowel: Normal appendix. Single uncomplicated diverticulum at distal descending colon. Stomach and bowel loops otherwise normal appearance Vascular/Lymphatic: Atherosclerotic calcifications aorta and iliac arteries somewhat prominent for age. No adenopathy. Reproductive: Unremarkable prostate gland and seminal vesicles Other: Small RIGHT inguinal hernia containing fat. No free air or free fluid. No inflammatory process. Musculoskeletal: Advanced degenerative changes of LEFT hip joint with marked joint space narrowing and bone-on-bone appearance, subchondral cystic changes and femoral head spur formation. Associated moderate LEFT hip joint effusion. Degenerative disc disease changes L4-L5 with grade 1 anterolisthesis and BILATERAL spondylolysis L4. IMPRESSION: Advanced degenerative changes of LEFT hip joint with associated moderate LEFT hip joint effusion. If there is clinical concern for septic arthritis of the LEFT hip, consider arthrocentesis Degenerative disc disease changes L4-L5 with grade 1 anterolisthesis due to BILATERAL spondylolysis L4. Minimal descending colonic diverticulosis without evidence of diverticulitis. Small RIGHT inguinal hernia containing fat. No acute intra-abdominal or intrapelvic abnormalities. Aortic Atherosclerosis (ICD10-I70.0). Electronically Signed   By: Lavonia Dana M.D.   On: 05/20/2022 15:01    Pending Labs Unresulted Labs (From admission, onward)     Start     Ordered   05/20/22 1617  Hepatitis B surface antibody,quantitative  (New Admission Hemo Labs (Hepatitis B))  Once,   URGENT        05/20/22 1618   Signed and Held  Renal function panel  Once,   R        Signed and Held            Vitals/Pain Today's Vitals   05/21/22 1300 05/21/22 1400 05/21/22 1414 05/21/22 1445  BP: (!) 157/87 (!) 172/120 (!) 172/120 (!) 166/108  Pulse: 71 78 78 73   Resp:   20 18  Temp:   98.1 F (36.7 C) 98.3 F (36.8 C)  TempSrc:   Oral Oral  SpO2: 93% 97% 97% 96%  PainSc:    0-No pain    Isolation Precautions No active isolations  Medications Medications  Chlorhexidine Gluconate Cloth 2 % PADS 6 each (6 each Topical Not Given 05/21/22 0758)  heparin sodium (porcine) 1000 UNIT/ML injection (  Not Given 05/21/22 0516)  atorvastatin (LIPITOR) tablet 40 mg (40 mg Oral Given 05/21/22 0848)  albuterol (PROVENTIL) (2.5 MG/3ML) 0.083% nebulizer solution 2.5 mg (has no administration in time range)  calcium acetate (PHOSLO) capsule 667 mg (667 mg Oral Given 05/21/22 1146)  hydrALAZINE (APRESOLINE) tablet 100 mg (100 mg Oral Given 05/21/22 0705)  traZODone (DESYREL) tablet 100 mg (has no administration in time range)  multivitamin with minerals tablet 1 tablet (1 tablet Oral Given 1/91/47 8295)  folic acid (FOLVITE) tablet 1 mg (1 mg Oral Given 05/21/22 0848)  melatonin  tablet 10 mg (has no administration in time range)  heparin injection 5,000 Units (5,000 Units Subcutaneous Given 05/21/22 0705)  acetaminophen (TYLENOL) tablet 650 mg (has no administration in time range)    Or  acetaminophen (TYLENOL) suppository 650 mg (has no administration in time range)  ondansetron (ZOFRAN) tablet 4 mg (has no administration in time range)    Or  ondansetron (ZOFRAN) injection 4 mg (has no administration in time range)  amLODipine (NORVASC) tablet 10 mg (10 mg Oral Given 05/21/22 0506)  labetalol (NORMODYNE) injection 10 mg (has no administration in time range)  cloNIDine (CATAPRES) tablet 0.3 mg (0.3 mg Oral Given 05/21/22 0848)  carvedilol (COREG) tablet 6.25 mg (6.25 mg Oral Given 05/21/22 0845)  Chlorhexidine Gluconate Cloth 2 % PADS 6 each (has no administration in time range)  calcitRIOL (ROCALTROL) capsule 1 mcg (has no administration in time range)  ondansetron (ZOFRAN-ODT) disintegrating tablet 4 mg (4 mg Oral Given 05/20/22 0331)  oxyCODONE-acetaminophen  (PERCOCET/ROXICET) 5-325 MG per tablet 1 tablet (1 tablet Oral Given 05/20/22 0332)  ondansetron (ZOFRAN-ODT) disintegrating tablet 8 mg (8 mg Oral Given 05/20/22 1551)  labetalol (NORMODYNE) injection 10 mg (10 mg Intravenous Given 05/20/22 1602)  cloNIDine (CATAPRES) tablet 0.1 mg (0.1 mg Oral Given 05/21/22 0313)    Mobility walks Low fall risk   Focused Assessments Renal Assessment Handoff:  Hemodialysis Schedule: Hemodialysis Schedule: Monday/Wednesday/Friday Last Hemodialysis date and time: 05/20/2022   Restricted appendage: left arm   R Recommendations: See Admitting Provider Note  Report given to:   Additional Notes: NA

## 2022-05-21 NOTE — ED Provider Notes (Signed)
Pt back from HD.  Still having headache and his BP is still significantly elevated.  Seen by Dr Alcario Drought, he will be admitted for BP management Pt is awake/alert at this time.    Ripley Fraise, MD 05/21/22 [phone]

## 2022-05-21 NOTE — Consult Note (Signed)
Renal Service Consult Note [facility]  Stanley [name] 05/21/2022 Sol Blazing, MD Requesting Physician: Dr. Sabino Gasser   Reason for Consult: ESRD pt w/ abd pain, N/V  HPI: The patient is a 46 y.o. year-old w hx of anxiety, ESRD on HD, depression, HL, HTN, hx CVA who presented to ED w/ abd pain w/ nausea/ vomiting. Pt missed HD on 8/17 and stated that this is how he feels when he misses dialysis. Also has been missing some of his home BP meds, due to "a lot going on right now". Pt was dialyzed overnight night last night here w/ 1.7 L UF due to cramping. BP"s were very high in ED 240/120, better today 180 /100.  Pt is now full admission, we are asked to see for ESRD.   Pt seen in ED. Was sleeping, feels " a lot better" today. No n/v or pain, no SOB.  Is on RA at 98%. No CXR done in ED.    ROS - denies CP, no joint pain, no HA, no blurry vision, no rash, no diarrhea, no nausea/ vomiting, no dysuria, no difficulty voiding   Past Medical History  Past Medical History:  Diagnosis Date   Anxiety    Chronic kidney disease (CKD), stage V ([place]) 11/2020   CKD (chronic kidney disease), stage IV (HCC)    Depression    Dyslexia    Dyspnea    Heart palpitations    High cholesterol    Hypertension    Hypertensive urgency    Polysubstance (including opioids) dependence, binge pattern ([place]) 08/2020   Stroke ([place]) 06/03/2017   "minor; completely recovered today" (06/13/2017)   Tobacco abuse    Past Surgical History  Past Surgical History:  Procedure Laterality Date   AV FISTULA PLACEMENT Left 09/29/2021   Procedure: LEFT RADIOCEPHALIC ARTERIOVENOUS (AV) FISTULA CREATION;  Surgeon: Cherre Robins, MD;  Location: [facility] OR;  Service: Vascular;  Laterality: Left;   INSERTION OF DIALYSIS CATHETER Right 09/25/2021   Procedure: INSERTION OF 19 sm PALIDROME PRECISION CHRONIC CATHETER;  Surgeon: Cherre Robins, MD;  Location: [facility] OR;  Service: Vascular;  Laterality: Right;   NO PAST  SURGERIES     Family History  Family History  Problem Relation Age of Onset   Hypertension Mother    Hypertension Brother    Diabetes type II Brother    Social History  reports that he has quit smoking. His smoking use included cigarettes. He has a 30.00 pack-year smoking history. He has never used smokeless tobacco. He reports current drug use. Drug: Marijuana. He reports that he does not drink alcohol. Allergies  Allergies  Allergen Reactions   Lactose Intolerance (Gi) Nausea And Vomiting   Nitroglycerin Anxiety and Other (See Comments)    Pain and feels like he is on fire   Other Swelling    All beans   Isosorbide Itching and Other (See Comments)    Headaches   Home medications Prior to Admission medications   Medication Sig Start Date End Date Taking? Authorizing Provider  acetaminophen (TYLENOL) 500 MG tablet Take 1,000 mg by mouth every 6 (six) hours as needed for mild pain.   Yes [provider]  albuterol (PROAIR HFA) 108 (90 Base) MCG/ACT inhaler Inhale 1-2 puffs into the lungs every 6 (six) hours as needed for wheezing or shortness of breath. 03/30/22  Yes McClung, Angela M, PA-C  amLODipine (NORVASC) 10 MG tablet Take 1 tablet (10 mg total) by mouth daily. 03/30/22 09/26/22  Yes Freeman Caldron M, PA-C  atorvastatin (LIPITOR) 40 MG tablet Take 1 tablet (40 mg total) by mouth daily. 03/30/22  Yes McClung, Dionne Bucy, PA-C  calcium acetate (PHOSLO) 667 MG tablet Take 1 tablet (667 mg total) by mouth 3 (three) times daily with meals. 08/15/21  Yes Elsie Stain, MD  cloNIDine (CATAPRES) 0.2 MG tablet Take 1 tablet (0.2 mg total) by mouth 3 (three) times daily. 03/30/22 09/26/22 Yes McClung, Dionne Bucy, PA-C  folic acid (FOLVITE) 938 MCG tablet Take 0.5 tablets (400 mcg total) by mouth daily. Patient taking differently: Take 800 mcg by mouth with breakfast, with lunch, and with evening meal. 03/10/20  Yes [name], FNP  hydrALAZINE (APRESOLINE) 100 MG tablet TAKE 1  TABLET BY MOUTH THREE TIMES DAILY. Please keep upcoming appt in May 2023 with Dr. Marlou Porch before anymore refills. Thank you Final Attempt Patient taking differently: Take 100 mg by mouth 3 (three) times daily. 03/30/22  Yes McClung, Dionne Bucy, PA-C  Melatonin 10 MG CAPS Take 1 tablet by mouth at bedtime.   Yes [provider]  Multiple Vitamin (MULTIVITAMIN WITH MINERALS) TABS tablet Take 1 tablet by mouth daily.   Yes [provider]  sodium bicarbonate 650 MG tablet Take 1,300 mg by mouth 2 (two) times daily. 04/26/22  Yes [provider]  traZODone (DESYREL) 100 MG tablet Take 1 tablet (100 mg total) by mouth at bedtime. 03/30/22 09/26/22 Yes McClung, Dionne Bucy, PA-C  furosemide (LASIX) 80 MG tablet Take 80 mg by mouth 2 (two) times daily. Patient not taking: Reported on 05/21/2022 01/19/22   [provider]  losartan (COZAAR) 100 MG tablet Take 100 mg by mouth daily. Patient not taking: Reported on 05/21/2022 11/23/21   [provider]     Vitals:   05/21/22 0600 05/21/22 0615 05/21/22 0630 05/21/22 0755  BP: (!) 179/105 (!) 192/102 (!) 212/105 (!) 195/116  Pulse: 79 81 71 79  Resp: 13 11 15 20   Temp:    98.2 F (36.8 C)  TempSrc:    Oral  SpO2: 94% 93% 98% 97%   Exam Gen alert, no distress No rash, cyanosis or gangrene Sclera anicteric, throat clear  No jvd or bruits Chest clear bilat to bases, no rales/ wheezing RRR no MRG Abd soft ntnd no mass or ascites +bs GU normal MS no joint effusions or deformity Ext no LE or UE edema, no wounds or ulcers Neuro is alert, Ox 3 , nf LFA AVF+bruit    Home meds include - albuterol, amlodipine, atorvastatin, calc acetate 1 ac, clonidine 0.2 tid, hydralazine 100 tid, MVI, trazodone, prns/ vits/ supps     OP HD: TTS [facility] 4h  400/600  81kg   2K/3Ca bath   LFA AVF  Heparin 2500  - last HD 8/15 post wt 82kg, comes off 1-5 kg over the last 2wks - last Hb 10.8 on 8/15, tsat 10 - mircera 225 q2, last  7/27, due 8/10 - calcitriol 1.0 ug tiw po  Assessment/ Plan: HTNsive urgency - BP's better after getting home meds and after HD Abd pain/ N/V - better today Homeless - SW consulted ESRD - on HD TTS. Missed OP HD 8/17. Got HD here last night, plan for HD again today to get back on schedule.  Abd pain - resolved today Anemia esrd - Hb 11-12 here, no esa needs.  MBD ckd - CCa in range, will add on phos. Cont binder, po vdra w/ hd.  Kelly Splinter  MD 05/21/2022, 10:03 AM Recent Labs  Lab 05/20/22 0340 05/21/22 0509  HGB 12.1* 10.9*  ALBUMIN 3.7  --   CALCIUM 8.0* 7.8*  CREATININE 15.87* 9.94*  K 3.7 3.8

## 2022-05-21 NOTE — H&P (Signed)
History and Physical    Patient: Stanley [name] ZOX:[id number] DOB: 1976/06/13 DOA: 05/20/2022 DOS: the patient was seen and examined on 05/21/2022 PCP: Elsie Stain, MD  Patient coming from: Homeless  Chief Complaint:  Chief Complaint  Patient presents with   Abdominal Pain   HPI: Stanley [name] is a 46 y.o. male with medical history significant of ESRD, accelerated HTN, polysubstance abuse.  The patient presents to the ED for evaluation of abdominal pain, nausea, vomiting and diarrhea.  The patient states that the symptoms began 2 days ago however acutely worsened last night.  Patient reports that he is a dialysis patient, goes on Tuesday, Thursday, Saturday.  The patient states that he missed his dialysis session yesterday.  The patient states that this is typically how he feels after missing dialysis.  Patient states that he was unable to make his dialysis session because he was "driving around and could not make it".  Pt also admits to missing his home BP meds as he "has a lot going on right now".  In ED: Dialysis performed in attempt to spare pt need for admission. However, pt still hypertensive post dialysis and has headache.  Will get patient admitted for HTN urgency until we can get BP control.  Review of Systems: As mentioned in the history of present illness. All other systems reviewed and are negative. Past Medical History:  Diagnosis Date   Anxiety    Chronic kidney disease (CKD), stage V ([place]) 11/2020   CKD (chronic kidney disease), stage IV (HCC)    Depression    Dyslexia    Dyspnea    Heart palpitations    High cholesterol    Hypertension    Hypertensive urgency    Polysubstance (including opioids) dependence, binge pattern ([place]) 08/2020   Stroke ([place]) 06/03/2017   "minor; completely recovered today" (06/13/2017)   Tobacco abuse    Past Surgical History:  Procedure Laterality Date   AV FISTULA PLACEMENT Left 09/29/2021   Procedure: LEFT RADIOCEPHALIC  ARTERIOVENOUS (AV) FISTULA CREATION;  Surgeon: Cherre Robins, MD;  Location: [place];  Service: Vascular;  Laterality: Left;   INSERTION OF DIALYSIS CATHETER Right 09/25/2021   Procedure: INSERTION OF 19 sm PALIDROME PRECISION CHRONIC CATHETER;  Surgeon: Cherre Robins, MD;  Location[facility];  Service: Vascular;  Laterality: Right;   NO PAST SURGERIES     Social History:  reports that he has quit smoking. His smoking use included cigarettes. He has a 30.00 pack-year smoking history. He has never used smokeless tobacco. He reports current drug use. Drug: Marijuana. He reports that he does not drink alcohol.  Allergies  Allergen Reactions   Lactose Intolerance (Gi) Nausea And Vomiting   Nitroglycerin Anxiety and Other (See Comments)    Pain and feels like he is on fire   Other Swelling    All beans   Isosorbide Itching    headaches    Family History  Problem Relation Age of Onset   Hypertension Mother    Hypertension Brother    Diabetes type II Brother     Prior to Admission medications   Medication Sig Start Date End Date Taking? Authorizing Provider  acetaminophen (TYLENOL) 500 MG tablet Take 1,000 mg by mouth as needed for mild pain.    [provider]  albuterol (PROAIR HFA) 108 (90 Base) MCG/ACT inhaler Inhale 1-2 puffs into the lungs every 6 (six) hours as needed for wheezing or shortness of breath. 03/30/22   McClung,  Dionne Bucy, PA-C  amLODipine (NORVASC) 10 MG tablet Take 1 tablet (10 mg total) by mouth daily. 03/30/22 09/26/22  Argentina Donovan, PA-C  atorvastatin (LIPITOR) 40 MG tablet Take 1 tablet (40 mg total) by mouth daily. 03/30/22   Argentina Donovan, PA-C  calcium acetate (PHOSLO) 667 MG tablet Take 1 tablet (667 mg total) by mouth 3 (three) times daily with meals. 08/15/21   Elsie Stain, MD  cloNIDine (CATAPRES) 0.2 MG tablet Take 1 tablet (0.2 mg total) by mouth 3 (three) times daily. 03/30/22 09/26/22  Argentina Donovan, PA-C  folic acid (FOLVITE) 626  MCG tablet Take 0.5 tablets (400 mcg total) by mouth daily. 03/10/20   [name], FNP  hydrALAZINE (APRESOLINE) 100 MG tablet TAKE 1 TABLET BY MOUTH THREE TIMES DAILY. Please keep upcoming appt in May 2023 with Dr. Marlou Porch before anymore refills. Thank you Final Attempt 03/30/22   Argentina Donovan, PA-C  Melatonin 10 MG CAPS Take 1 tablet by mouth daily.    [provider]  Multiple Vitamin (MULTIVITAMIN WITH MINERALS) TABS tablet Take 1 tablet by mouth daily.    [provider]  traZODone (DESYREL) 100 MG tablet Take 1 tablet (100 mg total) by mouth at bedtime. 03/30/22 09/26/22  Argentina Donovan, PA-C    Physical Exam: Vitals:   05/21/22 0100 05/21/22 0130 05/21/22 0210 05/21/22 0404  BP: (!) 232/122 (!) 233/124 (!) 228/111 (!) 221/121  Pulse: 72 77 82 82  Resp: 18 11 10 19   Temp:   99 F (37.2 C) 98.3 F (36.8 C)  TempSrc:   Oral Oral  SpO2: 96% 100% 95% 97%   Constitutional: NAD, calm, comfortable Eyes: PERRL, lids and conjunctivae normal ENMT: Mucous membranes are moist. Posterior pharynx clear of any exudate or lesions.Normal dentition.  Neck: normal, supple, no masses, no thyromegaly Respiratory: clear to auscultation bilaterally, no wheezing, no crackles. Normal respiratory effort. No accessory muscle use.  Cardiovascular: Regular rate and rhythm, no murmurs / rubs / gallops. No extremity edema. 2+ pedal pulses. No carotid bruits.  Abdomen: no tenderness, no masses palpated. No hepatosplenomegaly. Bowel sounds positive.  Musculoskeletal: no clubbing / cyanosis. No joint deformity upper and lower extremities. Good ROM, no contractures. Normal muscle tone.  Skin: no rashes, lesions, ulcers. No induration Neurologic: CN 2-12 grossly intact. Sensation intact, DTR normal. Strength 5/5 in all 4.  Psychiatric: Normal judgment and insight. Alert and oriented x 3. Normal mood.   Data Reviewed:    CMP     Component Value Date/Time   NA 139 05/20/2022 0340    NA 141 03/29/2021 1543   K 3.7 05/20/2022 0340   CL 96 (L) 05/20/2022 0340   CO2 25 05/20/2022 0340   GLUCOSE 86 05/20/2022 0340   BUN 64 (H) 05/20/2022 0340   BUN 71 (H) 03/29/2021 1543   CREATININE 15.87 (H) 05/20/2022 0340   CREATININE 3.44 (H) 06/20/2017 1100   CALCIUM 8.0 (L) 05/20/2022 0340   CALCIUM 8.6 (L) 08/27/2019 0220   PROT 7.3 05/20/2022 0340   PROT 6.8 03/29/2021 1543   ALBUMIN 3.7 05/20/2022 0340   ALBUMIN 4.6 03/29/2021 1543   AST 12 (L) 05/20/2022 0340   ALT 7 05/20/2022 0340   ALKPHOS 65 05/20/2022 0340   BILITOT 0.7 05/20/2022 0340   BILITOT 0.4 03/29/2021 1543   GFRNONAA 3 (L) 05/20/2022 0340   GFRNONAA 21 (L) 06/20/2017 1100   GFRAA 19 (L) 03/17/2020 1527   GFRAA 25 (L) 06/20/2017 1100  CBC    Component Value Date/Time   WBC 5.2 05/20/2022 0340   RBC 4.36 05/20/2022 0340   HGB 12.1 (L) 05/20/2022 0340   HGB 10.1 (L) 03/29/2021 1543   HCT 38.7 (L) 05/20/2022 0340   HCT 30.6 (L) 03/29/2021 1543   PLT 238 05/20/2022 0340   PLT 195 03/29/2021 1543   MCV 88.8 05/20/2022 0340   MCV 90 03/29/2021 1543   MCH 27.8 05/20/2022 0340   MCHC 31.3 05/20/2022 0340   RDW 16.4 (H) 05/20/2022 0340   RDW 15.8 (H) 03/29/2021 1543   LYMPHSABS 0.7 09/30/2021 0147   LYMPHSABS 0.7 03/29/2021 1543   MONOABS 0.6 09/30/2021 0147   EOSABS 0.3 09/30/2021 0147   EOSABS 0.3 03/29/2021 1543   BASOSABS 0.0 09/30/2021 0147   BASOSABS 0.0 03/29/2021 1543     Assessment and Plan: * Hypertensive urgency Likely due to combination of missed dialysis session + missed home meds. Persistent HTN and headache despite dialysis earlier this evening. Resume all home meds (clonidine, hydralazine, amlodipine) Give amlodipine now PRN labetalol IV Tele monitor  Homeless Social work consult to see if they have anything they can offer.  ESRD (end stage renal disease) ([place]) Just had dialysis earlier this evening. Call nephro if patient not discharged later today.      Advance  Care Planning:   Code Status: Full Code  Consults: Nephro performed dialysis, call them back for formal consult if pt not discharged later today.  Family Communication: No family in room  Severity of Illness: The appropriate patient status for this patient is OBSERVATION. Observation status is judged to be reasonable and necessary in order to provide the required intensity of service to ensure the patient's safety. The patient's presenting symptoms, physical exam findings, and initial radiographic and laboratory data in the context of their medical condition is felt to place them at decreased risk for further clinical deterioration. Furthermore, it is anticipated that the patient will be medically stable for discharge from the hospital within 2 midnights of admission.   Author: Etta Quill., DO 05/21/2022 4:17 AM  For on call review [email or web address].

## 2022-05-21 NOTE — Plan of Care (Signed)
  Problem: Activity: Goal: Risk for activity intolerance will decrease Outcome: Progressing   Problem: Nutrition: Goal: Adequate nutrition will be maintained Outcome: Progressing   Problem: Elimination: Goal: Will not experience complications related to bowel motility Outcome: Progressing   Problem: Skin Integrity: Goal: Risk for impaired skin integrity will decrease Outcome: Progressing   

## 2022-05-21 NOTE — Progress Notes (Signed)
Received patient in stretcher to unit.  Alert and oriented.  Informed consent signed and in  chart.   Treatment initiated: 2228 Treatment completed: 0210  Unable to reach UF goal of 4L due to patient was cramping. UF goal was decreased to 3L per RN Ob[place]. Lines were changed around 0030 due to machine detecting air in lines, also some clots noted on venous chamber.   47 minutes before the end of treatment, some clots noted on venous chamber again and machine alarming, unable to continue treatment. Patient refused to start another set-up and requested to end treatment, which was logical since we will be adding 982mls fluid total (2 rinseback 62ml, 1 prime 376ml) if new set-up is started. Also patient states he will be going to his outpatient center in the morning.  Returned blood safely, without difficulties. Patient had a total of 3hrs HD treatment.  Patient's BP remained elevated even after HD treatment. Dr. Hollie Salk was notified and ordered to give Clonidine 0.1mg  PO.   Patient was transported back to the ER alert, states he feels a little better after taking the clonidine.  Report given to ER nurse.   Access used: HD catheter Access issues: clots were pulled out from HD catheter per dialysis tech at the beginning of treatment. Around 2hrs after treatment initiation, set-up was changed due to some clots in venous chamber and machine detecting air in lines.  Total UF removed: 2.1L Medication(s) given: Clonidine 0.1mg  at [facility]

## 2022-05-21 NOTE — ED Notes (Signed)
Brother/friend Legrand Como) requests that any updates be relayed to him or his wife.

## 2022-05-21 NOTE — ED Notes (Signed)
Pt eating turkey sandwich

## 2022-05-21 NOTE — Hospital Course (Signed)
Stanley [name] is a 46 yo male with PMH ESRD on TTS HD, HTN, depression/anxiety, HLD, CVA, tobacco use who presented to the ER originally with abdominal pain.  He also had some nausea/vomiting and diarrhea.  He had missed dialysis on Thursday and presented to the hospital on Friday after also overheating outside due to living in his car. On workup in the ER he had excessively elevated BP and was resumed back on home meds. He also underwent HD session the evening of 05/20/22.

## 2022-05-21 NOTE — ED Notes (Signed)
Pt has returned to ED after dialysis session, per HD RN, 2.5L were pulled off in total. Pt remains hypertensive, 228/111.

## 2022-05-21 NOTE — Assessment & Plan Note (Addendum)
-   states he is living in his car and that shelters he's checked are full  - shelter resources provided to patient in AVS

## 2022-05-21 NOTE — Assessment & Plan Note (Addendum)
-   suspected due to missed HD session on 8/17 and possibly missed home meds - has been resumed on clonidine, hydralazine, amlodipine -BP better controlled after repeat dialysis session and increase in clonidine

## 2022-05-21 NOTE — Assessment & Plan Note (Addendum)
-   s/p HD on evening of 8/18 - nephrology following as well - patient to get HD again today, 8/19 to get back on TThSa schedule

## 2022-05-21 NOTE — Assessment & Plan Note (Signed)
-   continue lipitor

## 2022-05-22 DIAGNOSIS — I10 Essential (primary) hypertension: Secondary | ICD-10-CM | POA: Diagnosis not present

## 2022-05-22 DIAGNOSIS — Z992 Dependence on renal dialysis: Secondary | ICD-10-CM | POA: Diagnosis not present

## 2022-05-22 DIAGNOSIS — N186 End stage renal disease: Secondary | ICD-10-CM | POA: Diagnosis not present

## 2022-05-22 DIAGNOSIS — I160 Hypertensive urgency: Secondary | ICD-10-CM | POA: Diagnosis not present

## 2022-05-22 LAB — MRSA NEXT GEN BY PCR, NASAL: MRSA by PCR Next Gen: NOT DETECTED

## 2022-05-22 LAB — HEPATITIS B SURFACE ANTIBODY, QUANTITATIVE: Hep B S AB Quant (Post): 3.8 m[IU]/mL — ABNORMAL LOW (ref >9.9)

## 2022-05-22 MED ORDER — CLONIDINE HCL 0.3 MG PO TABS
0.3000 mg | ORAL_TABLET | Freq: Three times a day (TID) | ORAL | 3 refills | Status: DC
  Filled 2022-07-16: qty 90, 30d supply, fill #0

## 2022-05-22 NOTE — Progress Notes (Signed)
Received patient in bed to unit.  Alert and oriented.  Informed consent signed and in  chart.   Treatment initiated: 21:26 Treatment completed: 00:15  Patient tolerated well.  Tx ended early due to arterial access pulling air in the line. Run 3 hours Transported back to the room  alert, without acute distress.  Hand-off given to patient's nurse.   Access used: catherter Access issues: none  Total UF removed: 2000 m Medication(s) given: none Post HD VS: BP196/103 P76 RESP18 temp 99.1 Post HD weight: 79.7 kg   [name]

## 2022-05-22 NOTE — Progress Notes (Addendum)
[facility] Progress Note   Subjective:   Patient seen and examined at bedside.  Reports breathing a little better.  Able to finish breakfast and no n/v so far.  Only albe to removed 2L with HD yesterday due to alarming. Frustrated with his situation: Mother passed away, brother is in jail, no availability at resources provided by dialysis center and no [facility] in the car he is living in.     Objective Vitals:   05/22/22 0111 05/22/22 0200 05/22/22 0500 05/22/22 0930  BP: (!) 175/114 (!) 160/101 (!) 145/96 (!) 142/82  Pulse: 77  63 76  Resp: 17   15  Temp: 98.4 F (36.9 C)  98.6 F (37 C) 98.1 F (36.7 C)  TempSrc: Oral  Oral Oral  SpO2: 99%  99% 98%  Weight:      Height:       Physical Exam General:well appearing male in NAD Heart:RRR, no mrg Lungs:+rhonchi in LLL, nml WOB on RA Abdomen:soft, NTND Extremities:trace LE edema Dialysis Access: LU AVF +b/t   Filed Weights   05/21/22 1651 05/21/22 2035 05/22/22 0025  Weight: 81 kg 82.3 kg 79.7 kg    Intake/Output Summary (Last 24 hours) at 05/22/2022 0956 Last data filed at 05/22/2022 0600 Gross per 24 hour  Intake 667 ml  Output 2400 ml  Net -1733 ml    Additional Objective Labs: Basic Metabolic Panel: Recent Labs  Lab 05/20/22 0340 05/21/22 0506 05/21/22 0509  NA 139  --  138  K 3.7  --  3.8  CL 96*  --  97*  CO2 25  --  27  GLUCOSE 86  --  124*  BUN 64*  --  35*  CREATININE 15.87*  --  9.94*  CALCIUM 8.0*  --  7.8*  PHOS  --  5.3*  --    Liver Function Tests: Recent Labs  Lab 05/20/22 0340  AST 12*  ALT 7  ALKPHOS 65  BILITOT 0.7  PROT 7.3  ALBUMIN 3.7   Recent Labs  Lab 05/20/22 0340  LIPASE 82*   CBC: Recent Labs  Lab 05/20/22 0340 05/21/22 0509  WBC 5.2 3.7*  HGB 12.1* 10.9*  HCT 38.7* 35.1*  MCV 88.8 89.3  PLT 238 214   Blood Culture    Component Value Date/Time   SDES BLOOD BLOOD RIGHT HAND 02/03/2022 2020   SPECREQUEST  02/03/2022 2020    BOTTLES DRAWN AEROBIC  ONLY Blood Culture adequate volume   CULT  02/03/2022 2020    NO GROWTH 5 DAYS Performed at [facility], [address], [place], [place] [address]    REPTSTATUS 02/08/2022 FINAL 02/03/2022 2020    Studies/Results: CT ABDOMEN PELVIS WO CONTRAST  Result Date: 05/20/2022 CLINICAL DATA:  LEFT lower quadrant abdominal pain with vomiting and diarrhea since last night EXAM: CT ABDOMEN AND PELVIS WITHOUT CONTRAST TECHNIQUE: Multidetector CT imaging of the abdomen and pelvis was performed following the standard protocol without IV contrast. RADIATION DOSE REDUCTION: This exam was performed according to the departmental dose-optimization program which includes automated exposure control, adjustment of the mA and/or kV according to patient size and/or use of iterative reconstruction technique. COMPARISON:  None FINDINGS: Lower chest: Lung bases clear Hepatobiliary: Gallbladder and liver normal appearance Pancreas: Normal appearance Spleen: Normal appearance Adrenals/Urinary Tract: Adrenal glands normal appearance. 9 mm RIGHT renal cyst; no follow-up imaging recommended. Kidneys, ureters, and bladder otherwise normal appearance. No urinary tract calcification or dilatation. Stomach/Bowel: Normal appendix. Single uncomplicated diverticulum  at distal descending colon. Stomach and bowel loops otherwise normal appearance Vascular/Lymphatic: Atherosclerotic calcifications aorta and iliac arteries somewhat prominent for age. No adenopathy. Reproductive: Unremarkable prostate gland and seminal vesicles Other: Small RIGHT inguinal hernia containing fat. No free air or free fluid. No inflammatory process. Musculoskeletal: Advanced degenerative changes of LEFT hip joint with marked joint space narrowing and bone-on-bone appearance, subchondral cystic changes and femoral head spur formation. Associated moderate LEFT hip joint effusion. Degenerative disc disease changes L4-L5 with grade 1 anterolisthesis and BILATERAL  spondylolysis L4. IMPRESSION: Advanced degenerative changes of LEFT hip joint with associated moderate LEFT hip joint effusion. If there is clinical concern for septic arthritis of the LEFT hip, consider arthrocentesis Degenerative disc disease changes L4-L5 with grade 1 anterolisthesis due to BILATERAL spondylolysis L4. Minimal descending colonic diverticulosis without evidence of diverticulitis. Small RIGHT inguinal hernia containing fat. No acute intra-abdominal or intrapelvic abnormalities. Aortic Atherosclerosis (ICD10-I70.0). Electronically Signed   By: Lavonia Dana M.D.   On: 05/20/2022 15:01    Medications:   amLODipine  10 mg Oral Daily   atorvastatin  40 mg Oral Daily   calcium acetate  667 mg Oral TID WC   Chlorhexidine Gluconate Cloth  6 each Topical Q0600   Chlorhexidine Gluconate Cloth  6 each Topical Q0600   cloNIDine  0.3 mg Oral TID   feeding supplement (NEPRO CARB STEADY)  237 mL Oral BID BM   folic acid  1 mg Oral Daily   heparin  5,000 Units Subcutaneous Q8H   hydrALAZINE  100 mg Oral Q8H   melatonin  10 mg Oral QHS   multivitamin with minerals  1 tablet Oral Daily   pneumococcal 20-valent conjugate vaccine  0.5 mL Intramuscular Tomorrow-1000   traZODone  100 mg Oral QHS    Dialysis Orders: TTS [facility] 4h  400/600  81kg   2K/3Ca bath   LFA AVF  Heparin 2500  - last HD 8/15 post wt 82kg, comes off 1-5 kg over the last 2wks - last Hb 10.8 on 8/15, tsat 10 - mircera 225 q2, last 7/27, due 8/10 - calcitriol 1.0 ug tiw po   Assessment/ Plan: HTNsive urgency - BP's better after getting home meds and after HD Abd pain/ N/V - resolved.  Non compliance with HD - chronic issue.  Continue to educate about importance of going to treatments. Volume overload - improved with HD. Net UF 4.5L over last 2 days of HD. Homeless - SW consulted -unsure if there are additional options but she is checking into it. ESRD - on HD TTS. Missed OP HD 8/17. Got HD here last night, plan for HD again  today to get back on schedule.  Anemia esrd - Hb 11-12 here, no esa needs.  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 Cont binder, po vdra w/ hd.  Advanced degenerative changes of LEFT hip joint with associated moderate LEFT hip joint effusion - noted on CT.  Per notes, chronic issue  Jen Mow, PA-C [facility] 05/22/2022,9:56 AM  LOS: 0 days

## 2022-05-22 NOTE — Progress Notes (Signed)
Pt is back to unit from dialysis.

## 2022-05-22 NOTE — Assessment & Plan Note (Signed)
-   Still some concern for compliance issues but patient states he is compliant and cannot afford medications - Clonidine increased to 0.3 mg 3 times daily; continued on amlodipine, losartan, and hydralazine

## 2022-05-22 NOTE — Discharge Summary (Signed)
Physician Discharge Summary   Stanley [name] YDX:[id number] DOB: [birth year] DOA: 05/20/2022  PCP: Elsie Stain, MD  Admit date: 05/20/2022 Discharge date:  05/22/2022 Barriers to discharge: none  Admitted From: Home Disposition:  Home (patient sleeping in car) Discharging physician: Dwyane Dee, MD  Recommendations for Outpatient Follow-up:  Continue outpatient HD Follow up BP; clonidine was increased prior to discharge   Home Health:  Equipment/Devices:   Discharge Condition: stable CODE STATUS: Full Diet recommendation:  Diet Orders (From admission, onward)     Start     Ordered   05/21/22 0412  Diet renal with fluid restriction Fluid restriction: 1200 mL Fluid; Room service appropriate? Yes; Fluid consistency: Thin  Diet effective now       Question Answer Comment  Fluid restriction: 1200 mL Fluid   Room service appropriate? Yes   Fluid consistency: Thin      05/21/22 0411            Hospital Course: Stanley [name] is a 46 yo male with PMH ESRD on TTS HD, HTN, depression/anxiety, HLD, CVA, tobacco use who presented to the ER originally with abdominal pain.  He also had some nausea/vomiting and diarrhea.  He had missed dialysis on Thursday and presented to the hospital on Friday after also overheating outside due to living in his car. On workup in the ER he had excessively elevated BP and was resumed back on home meds. He also underwent HD session the evening of 05/20/22.   Assessment and Plan: * Hypertensive urgency - suspected due to missed HD session on 8/17 and possibly missed home meds - has been resumed on clonidine, hydralazine, amlodipine -BP better controlled after repeat dialysis session and increase in clonidine  Homeless - states he is living in his car and that shelters he's checked are full  - shelter resources provided to patient in [place]  ESRD on dialysis [facility]) - s/p HD on evening of 8/18 and again on 8/19; now is back on scheduled - next session  due on 8/22, patient aware - continue outpt HD  HTN (hypertension) - Still some concern for compliance issues but patient states he is compliant and cannot afford medications - Clonidine increased to 0.3 mg 3 times daily; continued on amlodipine, losartan, and hydralazine  HLD (hyperlipidemia) - continue lipitor        The patient's chronic medical conditions were treated accordingly per the patient's home medication regimen except as noted.  On day of discharge, patient was felt deemed stable for discharge. Patient/family member advised to call PCP or come back to ER if needed.   Principal Diagnosis: Hypertensive urgency  Discharge Diagnoses: Active Hospital Problems   Diagnosis Date Noted   Hypertensive urgency 05/21/2022    Priority: High   Homeless 05/21/2022    Priority: Medium    ESRD on dialysis [facility]) 09/25/2021    Priority: Medium    HTN (hypertension) 09/26/2016    Priority: Medium    HLD (hyperlipidemia) 05/21/2022    Resolved Hospital Problems  No resolved problems to display.     Discharge Instructions     Increase activity slowly   Complete by: As directed       Allergies as of 05/22/2022       Reactions   Lactose Intolerance (gi) Nausea And Vomiting   Nitroglycerin Anxiety, Other (See Comments)   Pain and feels like he is on fire   Other Swelling   All beans   Isosorbide Itching, Other (See Comments)  Headaches        Medication List     TAKE these medications    acetaminophen 500 MG tablet Commonly known as: TYLENOL Take 1,000 mg by mouth every 6 (six) hours as needed for mild pain.   albuterol 108 (90 Base) MCG/ACT inhaler Commonly known as: ProAir HFA Inhale 1-2 puffs into the lungs every 6 (six) hours as needed for wheezing or shortness of breath.   amLODipine 10 MG tablet Commonly known as: NORVASC Take 1 tablet (10 mg total) by mouth daily.   atorvastatin 40 MG tablet Commonly known as: LIPITOR Take 1 tablet (40 mg total)  by mouth daily.   calcium acetate 667 MG tablet Commonly known as: PHOSLO Take 1 tablet (667 mg total) by mouth 3 (three) times daily with meals.   cloNIDine 0.3 MG tablet Commonly known as: CATAPRES Take 1 tablet (0.3 mg total) by mouth 3 (three) times daily. What changed:  medication strength how much to take   folic acid 161 MCG tablet Commonly known as: FOLVITE Take 0.5 tablets (400 mcg total) by mouth daily. What changed:  how much to take when to take this   furosemide 80 MG tablet Commonly known as: LASIX Take 80 mg by mouth 2 (two) times daily.   hydrALAZINE 100 MG tablet Commonly known as: APRESOLINE TAKE 1 TABLET BY MOUTH THREE TIMES DAILY. Please keep upcoming appt in May 2023 with Dr. Marlou Porch before anymore refills. Thank you Final Attempt What changed:  how much to take how to take this when to take this additional instructions   losartan 100 MG tablet Commonly known as: COZAAR Take 100 mg by mouth daily.   Melatonin 10 MG Caps Take 1 tablet by mouth at bedtime.   multivitamin with minerals Tabs tablet Take 1 tablet by mouth daily.   sodium bicarbonate 650 MG tablet Take 1,300 mg by mouth 2 (two) times daily.   traZODone 100 MG tablet Commonly known as: DESYREL Take 1 tablet (100 mg total) by mouth at bedtime.        Follow-up Information     Elsie Stain, MD.   Specialty: Pulmonary Disease Why: As needed Contact information: [address] [address] [phone]         [place].   Specialty: Emergency Medicine Why: If symptoms worsen Contact information: [address] [id number] [place] [address] [phone]               Allergies  Allergen Reactions   Lactose Intolerance (Gi) Nausea And Vomiting   Nitroglycerin Anxiety and Other (See Comments)    Pain and feels like he is on fire   Other Swelling    All beans    Isosorbide Itching and Other (See Comments)    Headaches    Consultations: Nephrology  Procedures:   Discharge Exam: BP (!) 142/82 (BP Location: Left Arm)   Pulse 76   Temp 98.1 F (36.7 C) (Oral)   Resp 15   Ht 5\' 6"  (1.676 m)   Wt 79.7 kg   SpO2 98%   BMI 28.36 kg/m  Physical Exam Constitutional:      General: He is not in acute distress.    Appearance: Normal appearance.  HENT:     Head: Normocephalic and atraumatic.     Mouth/Throat:     Mouth: Mucous membranes are moist.  Eyes:     Extraocular Movements: Extraocular movements intact.  Cardiovascular:     Rate and Rhythm: Normal rate and regular rhythm.     Heart sounds: Normal heart sounds.  Pulmonary:     Effort: Pulmonary effort is normal. No respiratory distress.     Breath sounds: Normal breath sounds. No wheezing.  Abdominal:     General: Bowel sounds are normal. There is no distension.     Palpations: Abdomen is soft.     Tenderness: There is no abdominal tenderness.  Musculoskeletal:        General: Normal range of motion.     Cervical back: Normal range of motion and neck supple.  Skin:    General: Skin is warm and dry.  Neurological:     General: No focal deficit present.     Mental Status: He is alert.  Psychiatric:        Mood and Affect: Mood normal.        Behavior: Behavior normal.      The results of significant diagnostics from this hospitalization (including imaging, microbiology, ancillary and laboratory) are listed below for reference.   Microbiology: Recent Results (from the past 240 hour(s))  MRSA Next Gen by PCR, Nasal     Status: None   Collection Time: 05/22/22  1:58 AM   Specimen: Nasal Mucosa; Nasal Swab  Result Value Ref Range Status   MRSA by PCR Next Gen NOT DETECTED NOT DETECTED Final    Comment: (NOTE) The GeneXpert MRSA Assay (FDA approved for NASAL specimens only), is one component of a comprehensive MRSA colonization surveillance program. It is not intended to  diagnose MRSA infection nor to guide or monitor treatment for MRSA infections. Test performance is not FDA approved in patients less than 62 years old. Performed at [facility], [place] [address] [place], [place] [address]      Labs: BNP (last 3 results) Recent Labs    09/24/21 1227  BNP 5,643.3*   Basic Metabolic Panel: Recent Labs  Lab 05/20/22 0340 05/21/22 0506 05/21/22 0509  NA 139  --  138  K 3.7  --  3.8  CL 96*  --  97*  CO2 25  --  27  GLUCOSE 86  --  124*  BUN 64*  --  35*  CREATININE 15.87*  --  9.94*  CALCIUM 8.0*  --  7.8*  PHOS  --  5.3*  --    Liver Function Tests: Recent Labs  Lab 05/20/22 0340  AST 12*  ALT 7  ALKPHOS 65  BILITOT 0.7  PROT 7.3  ALBUMIN 3.7   Recent Labs  Lab 05/20/22 0340  LIPASE 82*   No results for input(s): "AMMONIA" in the last 168 hours. CBC: Recent Labs  Lab 05/20/22 0340 05/21/22 0509  WBC 5.2 3.7*  HGB 12.1* 10.9*  HCT 38.7* 35.1*  MCV 88.8 89.3  PLT 238 214   Cardiac Enzymes: No results for input(s): "CKTOTAL", "CKMB", "CKMBINDEX", "TROPONINI" in the last 168 hours. BNP: Invalid input(s): "POCBNP" CBG: No results for input(s): "GLUCAP" in the last 168 hours. D-Dimer No results for input(s): "DDIMER" in the last 72 hours. Hgb A1c No results for input(s): "HGBA1C" in the last 72 hours. Lipid Profile No results for input(s): "CHOL", "HDL", "LDLCALC", "TRIG", "CHOLHDL", "LDLDIRECT" in the last 72 hours. Thyroid function studies No results for input(s): "TSH", "T4TOTAL", "T3FREE", "THYROIDAB" in the last 72 hours.  Invalid input(s): "FREET3" Anemia work up No results for input(s): "VITAMINB12", "FOLATE", "FERRITIN", "TIBC", "IRON", "RETICCTPCT" in the last  72 hours. Urinalysis    Component Value Date/Time   COLORURINE YELLOW 05/20/2022 0352   APPEARANCEUR HAZY (A) 05/20/2022 0352   LABSPEC 1.011 05/20/2022 0352   PHURINE 8.0 05/20/2022 0352   GLUCOSEU NEGATIVE 05/20/2022 0352   HGBUR  NEGATIVE 05/20/2022 0352   BILIRUBINUR NEGATIVE 05/20/2022 0352   BILIRUBINUR neg 07/10/2020 0955   KETONESUR NEGATIVE 05/20/2022 0352   PROTEINUR >=300 (A) 05/20/2022 0352   UROBILINOGEN 0.2 07/10/2020 0955   UROBILINOGEN 0.2 11/24/2017 1509   NITRITE NEGATIVE 05/20/2022 0352   LEUKOCYTESUR SMALL (A) 05/20/2022 0352   Sepsis Labs Recent Labs  Lab 05/20/22 0340 05/21/22 0509  WBC 5.2 3.7*   Microbiology Recent Results (from the past 240 hour(s))  MRSA Next Gen by PCR, Nasal     Status: None   Collection Time: 05/22/22  1:58 AM   Specimen: Nasal Mucosa; Nasal Swab  Result Value Ref Range Status   MRSA by PCR Next Gen NOT DETECTED NOT DETECTED Final    Comment: (NOTE) The GeneXpert MRSA Assay (FDA approved for NASAL specimens only), is one component of a comprehensive MRSA colonization surveillance program. It is not intended to diagnose MRSA infection nor to guide or monitor treatment for MRSA infections. Test performance is not FDA approved in patients less than 32 years old. Performed at [facility], [place] [address], [place], [place] [address]     Procedures/Studies: CT ABDOMEN PELVIS WO CONTRAST  Result Date: 05/20/2022 CLINICAL DATA:  LEFT lower quadrant abdominal pain with vomiting and diarrhea since last night EXAM: CT ABDOMEN AND PELVIS WITHOUT CONTRAST TECHNIQUE: Multidetector CT imaging of the abdomen and pelvis was performed following the standard protocol without IV contrast. RADIATION DOSE REDUCTION: This exam was performed according to the departmental dose-optimization program which includes automated exposure control, adjustment of the mA and/or kV according to patient size and/or use of iterative reconstruction technique. COMPARISON:  None FINDINGS: Lower chest: Lung bases clear Hepatobiliary: Gallbladder and liver normal appearance Pancreas: Normal appearance Spleen: Normal appearance Adrenals/Urinary Tract: Adrenal glands normal appearance. 9 mm RIGHT  renal cyst; no follow-up imaging recommended. Kidneys, ureters, and bladder otherwise normal appearance. No urinary tract calcification or dilatation. Stomach/Bowel: Normal appendix. Single uncomplicated diverticulum at distal descending colon. Stomach and bowel loops otherwise normal appearance Vascular/Lymphatic: Atherosclerotic calcifications aorta and iliac arteries somewhat prominent for age. No adenopathy. Reproductive: Unremarkable prostate gland and seminal vesicles Other: Small RIGHT inguinal hernia containing fat. No free air or free fluid. No inflammatory process. Musculoskeletal: Advanced degenerative changes of LEFT hip joint with marked joint space narrowing and bone-on-bone appearance, subchondral cystic changes and femoral head spur formation. Associated moderate LEFT hip joint effusion. Degenerative disc disease changes L4-L5 with grade 1 anterolisthesis and BILATERAL spondylolysis L4. IMPRESSION: Advanced degenerative changes of LEFT hip joint with associated moderate LEFT hip joint effusion. If there is clinical concern for septic arthritis of the LEFT hip, consider arthrocentesis Degenerative disc disease changes L4-L5 with grade 1 anterolisthesis due to BILATERAL spondylolysis L4. Minimal descending colonic diverticulosis without evidence of diverticulitis. Small RIGHT inguinal hernia containing fat. No acute intra-abdominal or intrapelvic abnormalities. Aortic Atherosclerosis (ICD10-I70.0). Electronically Signed   By: Lavonia Dana M.D.   On: 05/20/2022 15:01     Time coordinating discharge: Over 33 minutes    Dwyane Dee, MD  [facility] 05/22/2022, 3:13 PM

## 2022-05-24 ENCOUNTER — Telehealth: Payer: Self-pay

## 2022-05-24 NOTE — Telephone Encounter (Signed)
Transition Care Management Unsuccessful Follow-up Telephone Call  Date of discharge and from where:  05/22/2022 [facility]  Attempts:  1st Attempt  Reason for unsuccessful TCM follow-up call:  Left voice messages on # [phone] and #  [phone]. Call back requested.   Need to schedule hospital follow up with Dr Joya Gaskins.

## 2022-05-25 ENCOUNTER — Telehealth: Payer: Self-pay

## 2022-05-25 NOTE — Telephone Encounter (Signed)
Transition Care Management Unsuccessful Follow-up Telephone Call  Date of discharge and from where:  05/22/2022, [facility]  Attempts:  2nd Attempt  Reason for unsuccessful TCM follow-up call:  Voice mail full # [phone] and # [phone].   Need to schedule hospital follow up appointment

## 2022-05-26 ENCOUNTER — Telehealth: Payer: Self-pay

## 2022-05-26 NOTE — Telephone Encounter (Signed)
Transition Care Management Unsuccessful Follow-up Telephone Call  Date of discharge and from where:  05/22/2022 [facility]   Attempts:  3rd Attempt  Reason for unsuccessful TCM follow-up call:  Left voice message on # [phone], call back requested. I also called  #  [phone] and the voicemail was full.   Letter sent to patient requesting he contact this clinic to schedule a hospital follow up appointment as we have not been able to reach him

## 2022-06-30 ENCOUNTER — Ambulatory Visit (HOSPITAL_COMMUNITY)
Admission: RE | Admit: 2022-06-30 | Discharge: 2022-06-30 | Disposition: A | Discharge status: Home / Self Care | Payer: Medicaid Other | Source: Ambulatory Visit | Attending: Vascular Surgery | Admitting: Vascular Surgery

## 2022-06-30 ENCOUNTER — Other Ambulatory Visit (HOSPITAL_COMMUNITY): Payer: Self-pay | Admitting: Nephrology

## 2022-06-30 DIAGNOSIS — R52 Pain, unspecified: Secondary | ICD-10-CM | POA: Diagnosis not present

## 2022-06-30 NOTE — Progress Notes (Deleted)
  POST OPERATIVE OFFICE NOTE    CC:  F/u for surgery  HPI:  This is a 46 y.o. male who is s/p left RC AV fistula on 09/29/21 by Dr. Stanford Breed.    Pt returns today for follow up.  Pt states ***   Allergies  Allergen Reactions   Lactose Intolerance (Gi) Nausea And Vomiting   Nitroglycerin Anxiety and Other (See Comments)    Pain and feels like he is on fire   Other Swelling    All beans   Isosorbide Itching and Other (See Comments)    Headaches    Current Outpatient Medications  Medication Sig Dispense Refill   acetaminophen (TYLENOL) 500 MG tablet Take 1,000 mg by mouth every 6 (six) hours as needed for mild pain.     albuterol (PROAIR HFA) 108 (90 Base) MCG/ACT inhaler Inhale 1-2 puffs into the lungs every 6 (six) hours as needed for wheezing or shortness of breath. 18 g 1   amLODipine (NORVASC) 10 MG tablet Take 1 tablet (10 mg total) by mouth daily. 90 tablet 1   atorvastatin (LIPITOR) 40 MG tablet Take 1 tablet (40 mg total) by mouth daily. 90 tablet 1   calcium acetate (PHOSLO) 667 MG tablet Take 1 tablet (667 mg total) by mouth 3 (three) times daily with meals. 90 tablet 4   cloNIDine (CATAPRES) 0.3 MG tablet Take 1 tablet (0.3 mg total) by mouth 3 (three) times daily. 90 tablet 3   folic acid (FOLVITE) 161 MCG tablet Take 0.5 tablets (400 mcg total) by mouth daily. (Patient taking differently: Take 800 mcg by mouth with breakfast, with lunch, and with evening meal.) 30 tablet 11   furosemide (LASIX) 80 MG tablet Take 80 mg by mouth 2 (two) times daily. (Patient not taking: Reported on 05/21/2022)     hydrALAZINE (APRESOLINE) 100 MG tablet TAKE 1 TABLET BY MOUTH THREE TIMES DAILY. Please keep upcoming appt in May 2023 with Dr. Marlou Porch before anymore refills. Thank you Final Attempt (Patient taking differently: Take 100 mg by mouth 3 (three) times daily.) 90 tablet 1   losartan (COZAAR) 100 MG tablet Take 100 mg by mouth daily. (Patient not taking: Reported on 05/21/2022)      Melatonin 10 MG CAPS Take 1 tablet by mouth at bedtime.     Multiple Vitamin (MULTIVITAMIN WITH MINERALS) TABS tablet Take 1 tablet by mouth daily.     sodium bicarbonate 650 MG tablet Take 1,300 mg by mouth 2 (two) times daily.     traZODone (DESYREL) 100 MG tablet Take 1 tablet (100 mg total) by mouth at bedtime. 90 tablet 1   No current facility-administered medications for this visit.     ROS:  See HPI  Physical Exam:  ***  Incision:  *** Extremities:  *** Neuro: *** Abdomen:  ***    Assessment/Plan:  This is a 46 y.o. male who is s/p: ***  -***   Roxy Horseman PA-C [facility] [phone]   Clinic MD:  ***

## 2022-07-01 ENCOUNTER — Ambulatory Visit: Payer: Medicaid Other

## 2022-07-14 NOTE — Progress Notes (Deleted)
HISTORY AND PHYSICAL     CC:  dialysis access Requesting Provider:  Rexene Agent, MD  HPI: This is a 46 y.o. male here for evaluation of his hemodialysis access.  Pt has hx of ESRD.  He underwent left RC AVF on 09/29/2021 by Dr. Stanford Breed.  Dialysis access history: -left RC AVF 09/29/2021 Dr. Stanford Breed   The pt is right hand dominant.    Pt *** on dialysis.   Days of dialysis if applicable:  ***    HD center:  *** St location.   The pt is on a statin for cholesterol management.  The pt is not on a daily aspirin.  Other AC:  none The pt is on CCB, diuretic, ARB for hypertension.  The pt does not diabetic.   Tobacco hx:  ***  Past Medical History:  Diagnosis Date   Anxiety    Chronic kidney disease (CKD), stage V ([place]) 11/2020   CKD (chronic kidney disease), stage IV (HCC)    Depression    Dyslexia    Dyspnea    Heart palpitations    High cholesterol    Hypertension    Hypertensive urgency    Polysubstance (including opioids) dependence, binge pattern ([place]) 08/2020   Stroke ([place]) 06/03/2017   "minor; completely recovered today" (06/13/2017)   Tobacco abuse     Past Surgical History:  Procedure Laterality Date   AV FISTULA PLACEMENT Left 09/29/2021   Procedure: LEFT RADIOCEPHALIC ARTERIOVENOUS (AV) FISTULA CREATION;  Surgeon: Cherre Robins, MD;  Location: [facility];  Service: Vascular;  Laterality: Left;   INSERTION OF DIALYSIS CATHETER Right 09/25/2021   Procedure: INSERTION OF 19 sm PALIDROME PRECISION CHRONIC CATHETER;  Surgeon: Cherre Robins, MD;  Location: [facility];  Service: Vascular;  Laterality: Right;   NO PAST SURGERIES      Allergies  Allergen Reactions   Lactose Intolerance (Gi) Nausea And Vomiting   Nitroglycerin Anxiety and Other (See Comments)    Pain and feels like he is on fire   Other Swelling    All beans   Isosorbide Itching and Other (See Comments)    Headaches    Current Outpatient Medications  Medication Sig Dispense Refill    acetaminophen (TYLENOL) 500 MG tablet Take 1,000 mg by mouth every 6 (six) hours as needed for mild pain.     albuterol (PROAIR HFA) 108 (90 Base) MCG/ACT inhaler Inhale 1-2 puffs into the lungs every 6 (six) hours as needed for wheezing or shortness of breath. 18 g 1   amLODipine (NORVASC) 10 MG tablet Take 1 tablet (10 mg total) by mouth daily. 90 tablet 1   atorvastatin (LIPITOR) 40 MG tablet Take 1 tablet (40 mg total) by mouth daily. 90 tablet 1   calcium acetate (PHOSLO) 667 MG tablet Take 1 tablet (667 mg total) by mouth 3 (three) times daily with meals. 90 tablet 4   cloNIDine (CATAPRES) 0.3 MG tablet Take 1 tablet (0.3 mg total) by mouth 3 (three) times daily. 90 tablet 3   folic acid (FOLVITE) 315 MCG tablet Take 0.5 tablets (400 mcg total) by mouth daily. (Patient taking differently: Take 800 mcg by mouth with breakfast, with lunch, and with evening meal.) 30 tablet 11   furosemide (LASIX) 80 MG tablet Take 80 mg by mouth 2 (two) times daily. (Patient not taking: Reported on 05/21/2022)     hydrALAZINE (APRESOLINE) 100 MG tablet TAKE 1 TABLET BY MOUTH THREE TIMES DAILY. Please keep upcoming appt in May 2023  with Dr. Marlou Porch before anymore refills. Thank you Final Attempt (Patient taking differently: Take 100 mg by mouth 3 (three) times daily.) 90 tablet 1   losartan (COZAAR) 100 MG tablet Take 100 mg by mouth daily. (Patient not taking: Reported on 05/21/2022)     Melatonin 10 MG CAPS Take 1 tablet by mouth at bedtime.     Multiple Vitamin (MULTIVITAMIN WITH MINERALS) TABS tablet Take 1 tablet by mouth daily.     sodium bicarbonate 650 MG tablet Take 1,300 mg by mouth 2 (two) times daily.     traZODone (DESYREL) 100 MG tablet Take 1 tablet (100 mg total) by mouth at bedtime. 90 tablet 1   No current facility-administered medications for this visit.    Family History  Problem Relation Age of Onset   Hypertension Mother    Hypertension Brother    Diabetes type II Brother     Social  History   Socioeconomic History   Marital status: Married    Spouse name: Not on file   Number of children: Not on file   Years of education: Not on file   Highest education level: Not on file  Occupational History   Not on file  Tobacco Use   Smoking status: Some Days    Packs/day: 0.20    Years: 30.00    Total pack years: 6.00    Types: Cigarettes   Smokeless tobacco: Never  Vaping Use   Vaping Use: Never used  Substance and Sexual Activity   Alcohol use: No   Drug use: Yes    Types: Marijuana    Comment: 06/13/2017 "not often"   Sexual activity: Not Currently  Other Topics Concern   Not on file  Social History Narrative   Not on file   Social Determinants of Health   Financial Resource Strain: Not on file  Food Insecurity: Not on file  Transportation Needs: Not on file  Physical Activity: Not on file  Stress: Not on file  Social Connections: Not on file  Intimate Partner Violence: Not on file     ROS: [x]  Positive   [ ]  Negative   [ ]  All sytems reviewed and are negative *** Cardiac: []  chest pain/pressure []  SOB/DOE  Vascular: []  pain in legs while walking []  pain in feet when lying flat []  swelling in legs  Pulmonary: []  asthma []  wheezing  Neurologic: []  hx CVA/TIA  Hematologic: []  bleeding problems  GI []  GERD  GU: [x]  CKD/renal failure  [x]  HD---[]  M/W/F []  T/T/S  Psychiatric: []  hx of major depression  Integumentary: []  rashes []  ulcers  Constitutional: []  fever []  chills   PHYSICAL EXAMINATION:  ***   General:  WDWN male in NAD Gait: Not observed HENT: WNL Pulmonary: normal non-labored breathing  Cardiac: {Desc; regular/irreg:14544}, {With/Without:20273} carotid bruit*** Abdomen: soft, NT Skin: {With/Without:20273} rashes Vascular Exam/Pulses:   Right Left  Radial {Exam; arterial pulse strength 0-4:30167} {Exam; arterial pulse strength 0-4:30167}  Ulnar {Exam; arterial pulse strength 0-4:30167} {Exam; arterial  pulse strength 0-4:30167}   Extremities:  *** Musculoskeletal: no muscle wasting or atrophy  Neurologic: A&O X 3  Non-Invasive Vascular Imaging:   Upper Extremity Vein Mapping on ***: ***   ASSESSMENT/PLAN: 46 y.o. male with ESRD here for evaluation of his hemodialysis access with hx of left RC AVF on 09/29/2021 by Dr. Stanford Breed.   -*** -pt *** on dialysis on *** -discussed with pt that access does not last forever and will need intervention or even new access  at some point.  -pt is *** hand dominant - will plan for *** -pt *** on anticoagulation   Leontine Locket, [facility] [phone]  Clinic MD:   Virl Cagey

## 2022-07-15 ENCOUNTER — Ambulatory Visit: Payer: Medicaid Other

## 2022-07-15 ENCOUNTER — Other Ambulatory Visit (HOSPITAL_COMMUNITY): Payer: Self-pay

## 2022-07-16 ENCOUNTER — Other Ambulatory Visit (HOSPITAL_COMMUNITY): Payer: Self-pay

## 2022-07-18 ENCOUNTER — Other Ambulatory Visit (HOSPITAL_COMMUNITY): Payer: Self-pay

## 2022-07-20 ENCOUNTER — Other Ambulatory Visit (HOSPITAL_COMMUNITY): Payer: Self-pay

## 2022-07-22 ENCOUNTER — Other Ambulatory Visit: Payer: Self-pay

## 2022-07-22 ENCOUNTER — Other Ambulatory Visit (HOSPITAL_COMMUNITY): Payer: Self-pay

## 2022-07-27 NOTE — Progress Notes (Deleted)
Patient ID: Stanley [name], male   DOB: [birth year], 46 y.o.   MRN: [id number]  After hospitalization 8/18-8/20/2023 Hospital Course: Stanley [name] is a 46 yo male with PMH ESRD on TTS HD, HTN, depression/anxiety, HLD, CVA, tobacco use who presented to the ER originally with abdominal pain.  He also had some nausea/vomiting and diarrhea.  He had missed dialysis on Thursday and presented to the hospital on Friday after also overheating outside due to living in his car. On workup in the ER he had excessively elevated BP and was resumed back on home meds. He also underwent HD session the evening of 05/20/22.    Assessment and Plan: * Hypertensive urgency - suspected due to missed HD session on 8/17 and possibly missed home meds - has been resumed on clonidine, hydralazine, amlodipine -BP better controlled after repeat dialysis session and increase in clonidine   Homeless - states he is living in his car and that shelters he's checked are full  - shelter resources provided to patient in AVS   ESRD on dialysis [facility]) - s/p HD on evening of 8/18 and again on 8/19; now is back on scheduled - next session due on 8/22, patient aware - continue outpt HD   HTN (hypertension) - Still some concern for compliance issues but patient states he is compliant and cannot afford medications - Clonidine increased to 0.3 mg 3 times daily; continued on amlodipine, losartan, and hydralazine   HLD (hyperlipidemia) - continue lipitor

## 2022-07-28 ENCOUNTER — Ambulatory Visit: Payer: Medicaid Other | Admitting: Critical Care Medicine

## 2022-07-28 ENCOUNTER — Ambulatory Visit: Payer: Medicaid Other | Admitting: Physician Assistant

## 2022-08-08 ENCOUNTER — Telehealth: Payer: Self-pay | Admitting: Critical Care Medicine

## 2022-08-08 NOTE — Telephone Encounter (Signed)
Milderd Meager would like a copy of the completed and resubmitted information and paperwork faxed to her as well at [phone] Attention Zigmund Daniel Short   Please contact further when possible

## 2022-08-08 NOTE — Telephone Encounter (Signed)
Called patient and scheduled him a telephone visit to go over paperwork

## 2022-08-08 NOTE — Telephone Encounter (Signed)
A fax from [employer] was sent on 11.2.23 for the pt to become his own Payee to manage benefits / they can also accept a letter / please fax to fax# [phone] / please advise if these are received and completed / social worker Zigmund Daniel will fax this paperwork again today and will call back to confirm later today / please advise if pt need a to be seen for this paperwork to be completed / his last visit was with Levada Dy and his next as well / please advise

## 2022-08-11 ENCOUNTER — Telehealth: Payer: Medicaid Other | Admitting: Family Medicine

## 2022-08-11 NOTE — Telephone Encounter (Signed)
Stanley [name] called to find out why pt's appt was rescheduled out so far?   She states pt is homeless, she is trying help pt to be his own payee.  Without this paperwork, pt cannot  get any money. Stanley [name] would like to know if pt can be seen sooner.   Any questions, call [phone] and ask for [facility].

## 2022-08-31 ENCOUNTER — Telehealth: Payer: Medicaid Other | Admitting: Critical Care Medicine

## 2022-09-01 ENCOUNTER — Encounter: Payer: Self-pay | Admitting: Physician Assistant

## 2022-09-01 ENCOUNTER — Other Ambulatory Visit: Payer: Self-pay

## 2022-09-01 ENCOUNTER — Ambulatory Visit: Payer: Medicaid Other | Attending: Critical Care Medicine | Admitting: Physician Assistant

## 2022-09-01 VITALS — BP 231/137 | HR 84 | Ht 71.5 in | Wt 177.2 lb

## 2022-09-01 DIAGNOSIS — E782 Mixed hyperlipidemia: Secondary | ICD-10-CM

## 2022-09-01 DIAGNOSIS — I10 Essential (primary) hypertension: Secondary | ICD-10-CM

## 2022-09-01 DIAGNOSIS — T886XXA Anaphylactic reaction due to adverse effect of correct drug or medicament properly administered, initial encounter: Secondary | ICD-10-CM | POA: Insufficient documentation

## 2022-09-01 DIAGNOSIS — I509 Heart failure, unspecified: Secondary | ICD-10-CM | POA: Diagnosis not present

## 2022-09-01 DIAGNOSIS — Z91199 Patient's noncompliance with other medical treatment and regimen due to unspecified reason: Secondary | ICD-10-CM

## 2022-09-01 DIAGNOSIS — I160 Hypertensive urgency: Secondary | ICD-10-CM

## 2022-09-01 DIAGNOSIS — Z5900 Homelessness unspecified: Secondary | ICD-10-CM

## 2022-09-01 DIAGNOSIS — E46 Unspecified protein-calorie malnutrition: Secondary | ICD-10-CM | POA: Insufficient documentation

## 2022-09-01 MED ORDER — CLONIDINE HCL 0.3 MG PO TABS
0.3000 mg | ORAL_TABLET | Freq: Three times a day (TID) | ORAL | 3 refills | Status: DC
  Filled 2022-09-01: qty 90, 30d supply, fill #0

## 2022-09-01 MED ORDER — LOSARTAN POTASSIUM 100 MG PO TABS
100.0000 mg | ORAL_TABLET | Freq: Every day | ORAL | 3 refills | Status: DC
  Filled 2022-09-01: qty 30, 30d supply, fill #0

## 2022-09-01 MED ORDER — AMLODIPINE BESYLATE 10 MG PO TABS
10.0000 mg | ORAL_TABLET | Freq: Every day | ORAL | 1 refills | Status: DC
  Filled 2022-09-01: qty 90, 90d supply, fill #0

## 2022-09-01 MED ORDER — CLONIDINE HCL 0.1 MG PO TABS
0.1000 mg | ORAL_TABLET | Freq: Once | ORAL | Status: AC
  Administered 2022-09-01: 0.1 mg via ORAL

## 2022-09-01 MED ORDER — FUROSEMIDE 80 MG PO TABS
80.0000 mg | ORAL_TABLET | Freq: Two times a day (BID) | ORAL | 2 refills | Status: DC
  Filled 2022-09-01 (×3): qty 60, 30d supply, fill #0

## 2022-09-01 MED ORDER — HYDRALAZINE HCL 100 MG PO TABS
100.0000 mg | ORAL_TABLET | Freq: Three times a day (TID) | ORAL | 2 refills | Status: DC
  Filled 2022-09-01 (×2): qty 90, 30d supply, fill #0

## 2022-09-01 MED ORDER — ATORVASTATIN CALCIUM 40 MG PO TABS
40.0000 mg | ORAL_TABLET | Freq: Every day | ORAL | 1 refills | Status: DC
  Filled 2022-09-01: qty 90, 90d supply, fill #0

## 2022-09-01 NOTE — Progress Notes (Addendum)
Patient ID: Stanley [name], male   DOB: 1975-11-22, 46 y.o.   MRN: [id number]    Stanley [name], is a 46 y.o. male  [id number]  [id number]  DOB - [birth year]  No chief complaint on file.      Subjective:   Stanley [name] is a 46 y.o. male here today for Currently doing dialysis T, Th, Sat.  Currently taking "2 of his BP" meds.  Sometimes stays in his car.  Sometimes lives with friends who he says they have "made him" sign over benefits to a family friends wife.  No money for gas. He says when he was here before his wife was with him but she kicked him out when he wouldn't sign over his money to her.  Answers to questions about medications are inconsistent.  I am unclear if he is actually taking any of his meds.  Also, as far as dialysis, I asked him if he had been today (Thursday) and he said no that he went yesterday (Wednesday).    His mom apparently took care of everything when she was alive and she died about a year ago.    He denies dizziness/CP/SOB.  He does not know if he has insurance so I will send his meds here   No problems updated.  ALLERGIES: Allergies  Allergen Reactions   Lactose Intolerance (Gi) Nausea And Vomiting   Nitroglycerin Anxiety and Other (See Comments)    Pain and feels like he is on fire   Other Swelling    All beans   Isosorbide Itching and Other (See Comments)    Headaches    PAST MEDICAL HISTORY: Past Medical History:  Diagnosis Date   Anxiety    Chronic kidney disease (CKD), stage V ([place]) 11/2020   CKD (chronic kidney disease), stage IV (HCC)    Depression    Dyslexia    Dyspnea    Heart palpitations    High cholesterol    Hypertension    Hypertensive urgency    Polysubstance (including opioids) dependence, binge pattern ([place]) 08/2020   Stroke ([place]) 06/03/2017   "minor; completely recovered today" (06/13/2017)   Tobacco abuse     MEDICATIONS AT HOME: Prior to Admission medications   Medication Sig Start Date End Date Taking?  Authorizing Provider  acetaminophen (TYLENOL) 500 MG tablet Take 1,000 mg by mouth every 6 (six) hours as needed for mild pain.    [provider]  albuterol (PROAIR HFA) 108 (90 Base) MCG/ACT inhaler Inhale 1-2 puffs into the lungs every 6 (six) hours as needed for wheezing or shortness of breath. 03/30/22   Argentina Donovan, PA-C  amLODipine (NORVASC) 10 MG tablet Take 1 tablet (10 mg total) by mouth daily. 09/01/22 02/28/23  Argentina Donovan, PA-C  atorvastatin (LIPITOR) 40 MG tablet Take 1 tablet (40 mg total) by mouth daily. 09/01/22   Argentina Donovan, PA-C  calcium acetate (PHOSLO) 667 MG tablet Take 1 tablet (667 mg total) by mouth 3 (three) times daily with meals. 08/15/21   Elsie Stain, MD  cloNIDine (CATAPRES) 0.3 MG tablet Take 1 tablet (0.3 mg total) by mouth 3 (three) times daily. 09/01/22   Argentina Donovan, PA-C  folic acid (FOLVITE) 469 MCG tablet Take 0.5 tablets (400 mcg total) by mouth daily. Patient taking differently: Take 800 mcg by mouth with breakfast, with lunch, and with evening meal. 03/10/20   [name], FNP  furosemide (LASIX) 80 MG tablet Take 1 tablet (80 mg total)  by mouth 2 (two) times daily. 09/01/22   Argentina Donovan, PA-C  hydrALAZINE (APRESOLINE) 100 MG tablet Take 1 tablet (100 mg total) by mouth 3 (three) times daily. 09/01/22   Argentina Donovan, PA-C  losartan (COZAAR) 100 MG tablet Take 1 tablet (100 mg total) by mouth daily. 09/01/22   Argentina Donovan, PA-C  Melatonin 10 MG CAPS Take 1 tablet by mouth at bedtime.    [provider]  Multiple Vitamin (MULTIVITAMIN WITH MINERALS) TABS tablet Take 1 tablet by mouth daily.    [provider]  sodium bicarbonate 650 MG tablet Take 1,300 mg by mouth 2 (two) times daily. 04/26/22   [provider]  traZODone (DESYREL) 100 MG tablet Take 1 tablet (100 mg total) by mouth at bedtime. 03/30/22 09/26/22  Argentina Donovan, PA-C    ROS: Neg HEENT Neg resp Neg  cardiac Neg GI Neg GU Neg MS Neg psych-denies SI/HI Neg neuro  Objective:   Vitals:   09/01/22 1349  BP: (!) 235/137  Pulse: 84  SpO2: 97%  Weight: 177 lb 3.2 oz (80.4 kg)  Height: 5' 11.5" (1.816 m)   Exam General appearance : Awake, alert, not in any distress. Speech Clear. Not toxic looking; tearful at times.  Depressed affect HEENT: Atraumatic and Normocephalic, Neck: Supple, no JVD. No cervical lymphadenopathy.  Chest: Good air entry bilaterally, CTAB.  No rales/rhonchi/wheezing CVS: S1 S2 regular, no murmurs.  Extremities: B/L Lower Ext shows no edema, both legs are warm to touch Neurology: Awake alert, and oriented X 3, CN II-XII intact, Non focal Skin: No Rash  Data Review Lab Results  Component Value Date   HGBA1C 5.0 07/10/2020   HGBA1C 5.0 07/10/2020   HGBA1C 5.0 (A) 07/10/2020   HGBA1C 5.0 07/10/2020    Assessment & Plan   1. Hypertensive urgency - cloNIDine (CATAPRES) tablet 0.1 mg  2. Uncontrolled hypertension Sent medicine here to help with cost and hopefully compliance - losartan (COZAAR) 100 MG tablet; Take 1 tablet (100 mg total) by mouth daily.  Dispense: 30 tablet; Refill: 3 - hydrALAZINE (APRESOLINE) 100 MG tablet; Take 1 tablet (100 mg total) by mouth 3 (three) times daily.  Dispense: 90 tablet; Refill: 2 - amLODipine (NORVASC) 10 MG tablet; Take 1 tablet (10 mg total) by mouth daily.  Dispense: 90 tablet; Refill: 1 - cloNIDine (CATAPRES) 0.3 MG tablet; Take 1 tablet (0.3 mg total) by mouth 3 (three) times daily.  Dispense: 90 tablet; Refill: 3  3. Chronic congestive heart failure, unspecified heart failure type (HCC) - furosemide (LASIX) 80 MG tablet; Take 1 tablet (80 mg total) by mouth 2 (two) times daily.  Dispense: 60 tablet; Refill: 2 - atorvastatin (LIPITOR) 40 MG tablet; Take 1 tablet (40 mg total) by mouth daily.  Dispense: 90 tablet; Refill: 1  4. Non compliance with medical treatment Sent medications here for better cost and to  help with compliance  5. Mixed hyperlipidemia - atorvastatin (LIPITOR) 40 MG tablet; Take 1 tablet (40 mg total) by mouth daily.  Dispense: 90 tablet; Refill: 1  6. Homeless    Return in about 1 month (around 10/01/2022) for Dr Joya Gaskins for BP recheck and other concerns.  The patient was given clear instructions to go to ER or return to medical center if symptoms don't improve, worsen or new problems develop. The patient verbalized understanding. The patient was told to call to get lab results if they haven't heard anything in the next week.  Freeman Caldron, PA-C [facility] and [facility], [place]   09/01/2022, 2:09 PM

## 2022-09-01 NOTE — Patient Instructions (Signed)
Take all of your medications as directed

## 2022-09-15 ENCOUNTER — Other Ambulatory Visit: Payer: Self-pay

## 2022-09-26 NOTE — Progress Notes (Deleted)
Established Patient Office Visit  Subjective   Patient ID: Stanley [name], male    DOB: [birth year]  Age: 46 y.o. MRN: [id number]  No chief complaint on file.   09/27/22 Not seen since 09/2021 Jacques Earthly 03/2022 Stanley [name] is a 46 y.o. male here today for a follow up visit After being seen at the ED 03/19/2022 for medication RF. Dialysis Tuesday, Thursday Saturday. Even though his brother took him to the ED for RF, the patient did not go and picked them up.  Skipped dialysis yesterday.  There is a friend with him today that is going to ensure he gets prescriptions and goes to dialysis tomorrow.  Mom recently passed away.  He has not been caring for himself for that reason.  He is starting to feel a little better but still grieving.  Denies SI/HI  From ED note: Stanley [name] is a 46 y.o. male presenting for medication refills.  He is here with his brother.  His brother states that their mother passed away about 2 weeks ago.  His brother went to visit him and noticed that all of his medication boxes were empty and was unsure of how long he has been without medicines.  Patient is on dialysis and completed dialysis today.  He is always tired after dialysis, and has a headache, but no other symptoms at this time.  Denies any chest pain or shortness of breath.  No vision changes.  No nausea or vomiting or weakness.  He has a primary care provider that he will be able to follow-up with, but brother thought it pertinent to get at least his blood pressure medications refilled today.  He is also having trouble sleeping as he has been without his trazodone and witnessed his mother's passing in front of him.  1. Essential hypertension  2. Uncontrolled hypertension  3. Hypertension, unspecified type  4. ESRD on dialysis [facility])    Reviewed patient's medications and sent a 30-day refill of his amlodipine, clonidine, hydralazine, trazodone.  Advised patient to follow-up with his PCP in the next few weeks for close  follow-up.  Patient has No headache, No chest pain, No abdominal pain - No Nausea, No new weakness tingling or numbness, No Cough - SOB.  Saw Mcclung again 11/30 . Hypertensive urgency - cloNIDine (CATAPRES) tablet 0.1 mg   2. Uncontrolled hypertension Sent medicine here to help with cost and hopefully compliance - losartan (COZAAR) 100 MG tablet; Take 1 tablet (100 mg total) by mouth daily.  Dispense: 30 tablet; Refill: 3 - hydrALAZINE (APRESOLINE) 100 MG tablet; Take 1 tablet (100 mg total) by mouth 3 (three) times daily.  Dispense: 90 tablet; Refill: 2 - amLODipine (NORVASC) 10 MG tablet; Take 1 tablet (10 mg total) by mouth daily.  Dispense: 90 tablet; Refill: 1 - cloNIDine (CATAPRES) 0.3 MG tablet; Take 1 tablet (0.3 mg total) by mouth 3 (three) times daily.  Dispense: 90 tablet; Refill: 3   3. Chronic congestive heart failure, unspecified heart failure type (HCC) - furosemide (LASIX) 80 MG tablet; Take 1 tablet (80 mg total) by mouth 2 (two) times daily.  Dispense: 60 tablet; Refill: 2 - atorvastatin (LIPITOR) 40 MG tablet; Take 1 tablet (40 mg total) by mouth daily.  Dispense: 90 tablet; Refill: 1   4. Non compliance with medical treatment Sent medications here for better cost and to help with compliance   5. Mixed hyperlipidemia - atorvastatin (LIPITOR) 40 MG tablet; Take 1 tablet (40 mg total) by  mouth daily.  Dispense: 90 tablet; Refill: 1   6. Homeless         {History (Optional):23778}  ROS    Objective:     There were no vitals taken for this visit. {Vitals History (Optional):23777}  Physical Exam   No results found for any visits on 09/27/22.  {Labs (Optional):23779}  The ASCVD Risk score (Arnett DK, et al., 2019) failed to calculate for the following reasons:   The valid systolic blood pressure range is 90 to 200 mmHg   The valid total cholesterol range is 130 to 320 mg/dL    Assessment & Plan:   Problem List Items Addressed This Visit    None   No follow-ups on file.    Asencion Noble, MD

## 2022-09-27 ENCOUNTER — Ambulatory Visit: Payer: Medicaid Other | Admitting: Critical Care Medicine

## 2022-10-10 ENCOUNTER — Telehealth: Payer: Self-pay

## 2022-10-10 ENCOUNTER — Telehealth: Payer: Self-pay | Admitting: Emergency Medicine

## 2022-10-10 NOTE — Telephone Encounter (Signed)
Copied from [place] [id number]. Topic: Appointment Scheduling - Scheduling Inquiry for Clinic >> Oct 10, 2022 10:57 AM Marcellus Scott wrote: Reason for CRM: Burnis Medin from [facility] is requesting a hospital follow-up appointment; no appointments are available within the next 7-14 days.Stated pt will be discharged today but cannot be discharged without a hospital follow up.   Please advise.

## 2022-10-10 NOTE — Telephone Encounter (Signed)
After multiple calls to [facility], I was finally able to locate the patient on [address].  I spoke to [facility] on that unit  who said that he told them he is now living in [place] so they have set him  up for outpatient dialysis there,

## 2022-10-11 NOTE — Telephone Encounter (Signed)
Noted, thank you Jane ! 

## 2022-10-11 NOTE — Telephone Encounter (Signed)
See other message regard this matter

## 2022-11-10 ENCOUNTER — Other Ambulatory Visit: Payer: Self-pay
